# Patient Record
Sex: Male | Born: 1945
Health system: Southern US, Community
[De-identification: ages and names within clinical notes are randomized; demographics above are authoritative.]

## PROBLEM LIST (undated history)

## (undated) DIAGNOSIS — Z7189 Other specified counseling: Secondary | ICD-10-CM

## (undated) DIAGNOSIS — Z972 Presence of dental prosthetic device (complete) (partial): Secondary | ICD-10-CM

## (undated) DIAGNOSIS — G629 Polyneuropathy, unspecified: Secondary | ICD-10-CM

## (undated) DIAGNOSIS — I2510 Atherosclerotic heart disease of native coronary artery without angina pectoris: Secondary | ICD-10-CM

## (undated) DIAGNOSIS — R413 Other amnesia: Secondary | ICD-10-CM

## (undated) DIAGNOSIS — N529 Male erectile dysfunction, unspecified: Secondary | ICD-10-CM

## (undated) DIAGNOSIS — Z973 Presence of spectacles and contact lenses: Secondary | ICD-10-CM

## (undated) DIAGNOSIS — E785 Hyperlipidemia, unspecified: Secondary | ICD-10-CM

## (undated) DIAGNOSIS — C8338 Diffuse large B-cell lymphoma, lymph nodes of multiple sites: Secondary | ICD-10-CM

## (undated) DIAGNOSIS — K219 Gastro-esophageal reflux disease without esophagitis: Secondary | ICD-10-CM

## (undated) DIAGNOSIS — J329 Chronic sinusitis, unspecified: Secondary | ICD-10-CM

## (undated) DIAGNOSIS — C9110 Chronic lymphocytic leukemia of B-cell type not having achieved remission: Secondary | ICD-10-CM

## (undated) DIAGNOSIS — I10 Essential (primary) hypertension: Secondary | ICD-10-CM

## (undated) DIAGNOSIS — E119 Type 2 diabetes mellitus without complications: Secondary | ICD-10-CM

## (undated) DIAGNOSIS — G4730 Sleep apnea, unspecified: Secondary | ICD-10-CM

## (undated) DIAGNOSIS — M109 Gout, unspecified: Secondary | ICD-10-CM

## (undated) DIAGNOSIS — K5790 Diverticulosis of intestine, part unspecified, without perforation or abscess without bleeding: Secondary | ICD-10-CM

## (undated) DIAGNOSIS — G959 Disease of spinal cord, unspecified: Secondary | ICD-10-CM

## (undated) DIAGNOSIS — E041 Nontoxic single thyroid nodule: Secondary | ICD-10-CM

## (undated) DIAGNOSIS — T148XXA Other injury of unspecified body region, initial encounter: Secondary | ICD-10-CM

## (undated) DIAGNOSIS — N281 Cyst of kidney, acquired: Secondary | ICD-10-CM

## (undated) DIAGNOSIS — M5416 Radiculopathy, lumbar region: Secondary | ICD-10-CM

## (undated) DIAGNOSIS — G4733 Obstructive sleep apnea (adult) (pediatric): Secondary | ICD-10-CM

## (undated) DIAGNOSIS — C801 Malignant (primary) neoplasm, unspecified: Secondary | ICD-10-CM

## (undated) HISTORY — PX: BACK SURGERY: SHX140

## (undated) HISTORY — DX: Radiculopathy, lumbar region: M54.16

## (undated) HISTORY — DX: Hyperlipidemia, unspecified: E78.5

## (undated) HISTORY — DX: Polyneuropathy, unspecified: G62.9

## (undated) HISTORY — PX: COLONOSCOPY: SHX5424

## (undated) HISTORY — DX: Diverticulosis of intestine, part unspecified, without perforation or abscess without bleeding: K57.90

## (undated) HISTORY — DX: Nontoxic single thyroid nodule: E04.1

## (undated) HISTORY — DX: Gout, unspecified: M10.9

## (undated) HISTORY — DX: Cyst of kidney, acquired: N28.1

## (undated) HISTORY — DX: Other amnesia: R41.3

## (undated) HISTORY — PX: CARDIAC CATHETERIZATION: SHX172

## (undated) HISTORY — DX: Male erectile dysfunction, unspecified: N52.9

## (undated) HISTORY — DX: Obstructive sleep apnea (adult) (pediatric): G47.33

## (undated) HISTORY — DX: Chronic sinusitis, unspecified: J32.9

## (undated) HISTORY — PX: MULTIPLE TOOTH EXTRACTIONS: SHX2053

## (undated) HISTORY — DX: Disease of spinal cord, unspecified: G95.9

---
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
## 1996-10-03 DIAGNOSIS — I10 Essential (primary) hypertension: Secondary | ICD-10-CM | POA: Insufficient documentation

## 2000-03-01 DIAGNOSIS — J309 Allergic rhinitis, unspecified: Secondary | ICD-10-CM | POA: Insufficient documentation

## 2007-03-14 DIAGNOSIS — E785 Hyperlipidemia, unspecified: Secondary | ICD-10-CM | POA: Insufficient documentation

## 2008-12-31 DIAGNOSIS — E1165 Type 2 diabetes mellitus with hyperglycemia: Secondary | ICD-10-CM | POA: Insufficient documentation

## 2011-06-15 DIAGNOSIS — N400 Enlarged prostate without lower urinary tract symptoms: Secondary | ICD-10-CM | POA: Insufficient documentation

## 2012-03-07 DIAGNOSIS — M4326 Fusion of spine, lumbar region: Secondary | ICD-10-CM | POA: Insufficient documentation

## 2013-06-05 DIAGNOSIS — M4322 Fusion of spine, cervical region: Secondary | ICD-10-CM | POA: Insufficient documentation

## 2014-06-03 DIAGNOSIS — M960 Pseudarthrosis after fusion or arthrodesis: Secondary | ICD-10-CM | POA: Insufficient documentation

## 2015-03-02 DIAGNOSIS — R7402 Elevation of levels of lactic acid dehydrogenase (LDH): Secondary | ICD-10-CM | POA: Insufficient documentation

## 2015-03-02 DIAGNOSIS — R740 Nonspecific elevation of levels of transaminase and lactic acid dehydrogenase [LDH]: Secondary | ICD-10-CM

## 2015-07-28 DIAGNOSIS — R269 Unspecified abnormalities of gait and mobility: Secondary | ICD-10-CM | POA: Insufficient documentation

## 2016-05-22 DIAGNOSIS — R600 Localized edema: Secondary | ICD-10-CM | POA: Insufficient documentation

## 2016-05-22 DIAGNOSIS — J209 Acute bronchitis, unspecified: Secondary | ICD-10-CM | POA: Insufficient documentation

## 2016-05-22 DIAGNOSIS — A419 Sepsis, unspecified organism: Secondary | ICD-10-CM | POA: Insufficient documentation

## 2016-05-22 DIAGNOSIS — R531 Weakness: Secondary | ICD-10-CM | POA: Insufficient documentation

## 2016-05-22 DIAGNOSIS — C9510 Chronic leukemia of unspecified cell type not having achieved remission: Secondary | ICD-10-CM

## 2016-05-24 DIAGNOSIS — J101 Influenza due to other identified influenza virus with other respiratory manifestations: Secondary | ICD-10-CM | POA: Insufficient documentation

## 2016-10-31 DIAGNOSIS — I2510 Atherosclerotic heart disease of native coronary artery without angina pectoris: Secondary | ICD-10-CM | POA: Insufficient documentation

## 2017-01-25 ENCOUNTER — Other Ambulatory Visit (HOSPITAL_COMMUNITY)
Admission: RE | Admit: 2017-01-25 | Discharge: 2017-01-25 | Disposition: A | Discharge status: Home / Self Care | Payer: Federal, State, Local not specified - PPO | Source: Ambulatory Visit | Attending: Hematology & Oncology | Admitting: Hematology & Oncology

## 2017-01-25 ENCOUNTER — Ambulatory Visit (HOSPITAL_BASED_OUTPATIENT_CLINIC_OR_DEPARTMENT_OTHER): Payer: Federal, State, Local not specified - PPO | Admitting: Hematology & Oncology

## 2017-01-25 ENCOUNTER — Other Ambulatory Visit (HOSPITAL_BASED_OUTPATIENT_CLINIC_OR_DEPARTMENT_OTHER): Payer: Federal, State, Local not specified - PPO

## 2017-01-25 VITALS — BP 117/76 | HR 77 | Temp 97.9°F | Resp 20 | Wt 278.0 lb

## 2017-01-25 DIAGNOSIS — Z87891 Personal history of nicotine dependence: Secondary | ICD-10-CM | POA: Diagnosis not present

## 2017-01-25 DIAGNOSIS — C9110 Chronic lymphocytic leukemia of B-cell type not having achieved remission: Secondary | ICD-10-CM

## 2017-01-25 DIAGNOSIS — C9510 Chronic leukemia of unspecified cell type not having achieved remission: Secondary | ICD-10-CM

## 2017-01-25 LAB — CBC WITH DIFFERENTIAL (CANCER CENTER ONLY)
BASO#: 0.2 10*3/uL (ref 0.0–0.2)
BASO%: 3.8 % — ABNORMAL HIGH (ref 0.0–2.0)
EOS%: 2.7 % (ref 0.0–7.0)
Eosinophils Absolute: 0.2 10*3/uL (ref 0.0–0.5)
HCT: 43.1 % (ref 38.7–49.9)
HGB: 15.7 g/dL (ref 13.0–17.1)
LYMPH#: 1.6 10*3/uL (ref 0.9–3.3)
LYMPH%: 27.9 % (ref 14.0–48.0)
MCH: 31.8 pg (ref 28.0–33.4)
MCHC: 36.4 g/dL — ABNORMAL HIGH (ref 32.0–35.9)
MCV: 86 fL (ref 82–98)
MONO#: 0.8 10*3/uL (ref 0.1–0.9)
MONO%: 14.6 % — ABNORMAL HIGH (ref 0.0–13.0)
NEUT#: 2.8 10*3/uL (ref 1.5–6.5)
NEUT%: 51 % (ref 40.0–80.0)
Platelets: 181 10*3/uL (ref 145–400)
RBC: 5 10*6/uL (ref 4.20–5.70)
RDW: 13.2 % (ref 11.1–15.7)
WBC: 5.6 10*3/uL (ref 4.0–10.0)

## 2017-01-25 LAB — LACTATE DEHYDROGENASE: LDH: 276 U/L — ABNORMAL HIGH (ref 125–245)

## 2017-01-25 LAB — CMP (CANCER CENTER ONLY)
ALT(SGPT): 32 U/L (ref 10–47)
AST: 30 U/L (ref 11–38)
Albumin: 4.2 g/dL (ref 3.3–5.5)
Alkaline Phosphatase: 59 U/L (ref 26–84)
BUN, Bld: 12 mg/dL (ref 7–22)
CO2: 33 mEq/L (ref 18–33)
Calcium: 9.7 mg/dL (ref 8.0–10.3)
Chloride: 102 mEq/L (ref 98–108)
Creat: 1.4 mg/dl — ABNORMAL HIGH (ref 0.6–1.2)
Glucose, Bld: 128 mg/dL — ABNORMAL HIGH (ref 73–118)
Potassium: 4.1 mEq/L (ref 3.3–4.7)
Sodium: 142 mEq/L (ref 128–145)
Total Bilirubin: 1.1 mg/dl (ref 0.20–1.60)
Total Protein: 7.2 g/dL (ref 6.4–8.1)

## 2017-01-25 LAB — CHCC SATELLITE - SMEAR

## 2017-01-25 NOTE — Progress Notes (Signed)
Referral MD  Reason for Referral: History of "chronic leukemia"   Chief Complaint  Patient presents with  . New Patient (Initial Visit)  : I was told I had "chronic leukemia"  HPI: Ronald Rice is a very nice 71 year old African American male. He lived in [place] for 45 years. He is moved to the [place] area as his family is here now.  He apparently has a diagnosis of "chronic leukemia". Unfortunate, no medical records from [place] were sent to me. Everything I have is from the patient. He really is a little fuzzy on details.  He says he was diagnosed with "chronic leukemia" back in 2015. He did not have any swollen lymph nodes. He said he had no weight loss. He had no cough. He had no change in bowel or bladder habits. He had no chronic infections.  He says he never had a bone marrow test. If he is not even sure what chemotherapy he received. He said that he got chemotherapy a couple days a month.  He does not have a Port-A-Cath.  Currently, he on maintenance Rituxan. He gets every 2 months. He has had 8 treatments.  He is from [place], [place]. I try to leave a message for his oncologist in [place], Dr. Georgian Co. Unfortunate, all I got was a phone tree that Passed me on 2 other phone trees.  He apparently does not kid sick with treatments.  The problem that he is having right now that he has weakness in his feet. He has had multiple lower back surgeries. He has been operated on by Dr. Billey Co. We call his office to try to get records but they would not release anything without a signed release from the patient.  It is clear that he has some weakness in his feet. He walks with a cane. He says he's been this way for several years. I am not sure exactly what tests he's had done. He said he had an MRI done before he came to [place].  He used to smoke. He says he smoked for only 15 years. He said he smoked 1 cigarette a day.  He does have an occasional alcoholic  beverage.  He was in the [employer]. He was a Airline pilot.  There is no family history of cancer. However, he says that a sister, he thinks, had breast cancer.  He's had no other surgeries.  He's had no rashes.  He was kindly referred to the [facility] for an evaluation.  Currently, I would say his performance status is ECOG 1.   No past medical history on file.:  No past surgical history on file.:   Current Outpatient Prescriptions:  .  amLODipine (NORVASC) 5 MG tablet, Take 5 mg by mouth daily., Disp: , Rfl:  .  aspirin EC 81 MG tablet, Take 81 mg by mouth daily., Disp: , Rfl:  .  atenolol (TENORMIN) 25 MG tablet, Take by mouth daily., Disp: , Rfl:  .  Green Tea, Camillia sinensis, (GREEN TEA EXTRACT PO), Take by mouth., Disp: , Rfl:  .  lisinopril-hydrochlorothiazide (PRINZIDE,ZESTORETIC) 20-25 MG tablet, Take 2 tablets by mouth daily., Disp: , Rfl:  .  lovastatin (MEVACOR) 40 MG tablet, Take 40 mg by mouth at bedtime., Disp: , Rfl:  .  Misc Natural Products (GINSENG COMPLEX PO), Take by mouth., Disp: , Rfl:  .  NON FORMULARY, Blood Sugar Defense, Disp: , Rfl: :  :  No Known Allergies:  No family  history on file.:  Social History   Social History  . Marital status: Single    Spouse name: N/A  . Number of children: N/A  . Years of education: N/A   Occupational History  . Not on file.   Social History Main Topics  . Smoking status: Not on file  . Smokeless tobacco: Not on file  . Alcohol use Not on file  . Drug use: Unknown  . Sexual activity: Not on file   Other Topics Concern  . Not on file   Social History Narrative  . No narrative on file  :  Pertinent items are noted in HPI.  Exam:Well-developed and well-nourished African-American male in no obvious distress. His vital signs show a temperature of 97.9. Pulse 77. Blood pressure 117/76. Weight is 278 pounds. Head and neck exam shows no ocular or oral lesions. There are no palpable  cervical or supraclavicular lymph nodes. Lungs are clear bilaterally. Cardiac exam regular rate and rhythm with no murmurs, rubs or bruits. Abdomen is soft. Has good bowel sounds. He is modestly obese. He has no fluid wave. There is no palpable liver or spleen tip. There is no inguinal adenopathy bilaterally. Axillary exam shows no bilateral axillary adenopathy. Back exam shows a long lumbar laminectomy scar. No tenderness is noted over the spine, ribs or hips. Extremities shows no clubbing, cyanosis or edema. He has good range of motion of his joints. He has some weakness in his feet bilaterally with plantar and dorsi flexion. Skin exam shows no rashes, ecchymoses or petechia. Neurological exam shows no focal neurological deficits. He does have the weakness in his feet.    Recent Labs  01/25/17 1217  WBC 5.6  HGB 15.7  HCT 43.1  PLT 181    Recent Labs  01/25/17 1217  NA 142  K 4.1  CL 102  CO2 33  GLUCOSE 128*  BUN 12  CREATININE 1.4*  CALCIUM 9.7    Blood smear review:  Normochromic and normocytic population of red blood cells. There are no nucleated red blood cells. There is no teardrop cells. I see no rouleau formation. There is no inclusion bodies. White blood cells per normal in morphology and maturation. I see no immature myeloid or lymphoid forms. Platelets are adequate in number and size.  Pathology: Flow cytometry is pending     Assessment and Plan:  Ronald Rice is a 71 year old African-American male. He was out in [place]. He was treated in [place].  I really am at a "disadvantage" in trying to figure out what needs to be done outside of the Rituxan or even if he needs Rituxan. I really have to have records from [place]. I have made calls out to [place] with no luck so far.  I really needs to see what type of "chronic leukemia" he has. It sounds like CLL. It may be a low-grade lymphoma.  I'm sure that he got outstanding care in [place]. Again we really  have to see the records.  My concern now is the weakness in his feet. I do believe that this is coming from his lumbar spine. I don't think this would be anything related to his leukemia, unless he had vincristine or maybe Velcade and has some kind of neuropathy.  I'm going to have to refer him to spinal surgery. I will like to have Dr. Phylliss Bob see him.  I think we certainly have time to figure out what is going on before we give him Rituxan.  I spent about 60 minutes with he and his son-in-law. Also, I think his sister may have come along also.  I answered all their questions. I explained to them my concern. He is in agreement. He says he's had this problem with his feet for several years. I can't figure out why, if his had this for so long, this was not tended to in [place].  Hopefully, we'll get records from [place], we will be able to sort out the issues.  I will not make a follow-up appointment for him yet until I get records so we know exactly what is going on.  He is a true American hero. He far for her country. His son-in-law was in [employer] for 23 years. I also thanked him for his service and for protecting our country and our rights.

## 2017-01-26 LAB — BETA 2 MICROGLOBULIN, SERUM: Beta-2: 2.3 mg/L (ref 0.6–2.4)

## 2017-02-14 ENCOUNTER — Other Ambulatory Visit: Payer: Self-pay | Admitting: Physical Medicine and Rehabilitation

## 2017-02-14 DIAGNOSIS — M25551 Pain in right hip: Secondary | ICD-10-CM

## 2017-02-18 ENCOUNTER — Encounter: Payer: Self-pay | Admitting: Hematology & Oncology

## 2017-02-18 LAB — FLOW CYTOMETRY - CHCC SATELLITE

## 2017-02-24 ENCOUNTER — Ambulatory Visit
Admission: RE | Admit: 2017-02-24 | Discharge: 2017-02-24 | Disposition: A | Discharge status: Home / Self Care | Payer: Federal, State, Local not specified - PPO | Source: Ambulatory Visit | Attending: Physical Medicine and Rehabilitation | Admitting: Physical Medicine and Rehabilitation

## 2017-02-24 DIAGNOSIS — M25551 Pain in right hip: Secondary | ICD-10-CM

## 2017-02-24 MED ORDER — IOPAMIDOL (ISOVUE-M 200) INJECTION 41%
1.0000 mL | Freq: Once | INTRAMUSCULAR | Status: AC
  Administered 2017-02-24: 1 mL via INTRA_ARTICULAR

## 2017-02-24 MED ORDER — METHYLPREDNISOLONE ACETATE 40 MG/ML INJ SUSP (RADIOLOG
120.0000 mg | Freq: Once | INTRAMUSCULAR | Status: AC
  Administered 2017-02-24: 120 mg via INTRA_ARTICULAR

## 2017-02-25 ENCOUNTER — Other Ambulatory Visit: Payer: Self-pay | Admitting: Orthopedic Surgery

## 2017-02-25 DIAGNOSIS — M5412 Radiculopathy, cervical region: Secondary | ICD-10-CM

## 2017-03-01 DIAGNOSIS — Z125 Encounter for screening for malignant neoplasm of prostate: Secondary | ICD-10-CM | POA: Diagnosis not present

## 2017-03-01 DIAGNOSIS — I10 Essential (primary) hypertension: Secondary | ICD-10-CM | POA: Diagnosis not present

## 2017-03-01 DIAGNOSIS — E785 Hyperlipidemia, unspecified: Secondary | ICD-10-CM | POA: Diagnosis not present

## 2017-03-01 DIAGNOSIS — E119 Type 2 diabetes mellitus without complications: Secondary | ICD-10-CM | POA: Diagnosis not present

## 2017-03-08 DIAGNOSIS — E119 Type 2 diabetes mellitus without complications: Secondary | ICD-10-CM | POA: Diagnosis not present

## 2017-03-08 DIAGNOSIS — I10 Essential (primary) hypertension: Secondary | ICD-10-CM | POA: Diagnosis not present

## 2017-03-08 DIAGNOSIS — C9190 Lymphoid leukemia, unspecified not having achieved remission: Secondary | ICD-10-CM | POA: Diagnosis not present

## 2017-03-08 DIAGNOSIS — E785 Hyperlipidemia, unspecified: Secondary | ICD-10-CM | POA: Diagnosis not present

## 2017-03-08 DIAGNOSIS — M109 Gout, unspecified: Secondary | ICD-10-CM | POA: Diagnosis not present

## 2017-03-08 DIAGNOSIS — I2510 Atherosclerotic heart disease of native coronary artery without angina pectoris: Secondary | ICD-10-CM | POA: Diagnosis not present

## 2017-03-08 DIAGNOSIS — K219 Gastro-esophageal reflux disease without esophagitis: Secondary | ICD-10-CM | POA: Diagnosis not present

## 2017-03-08 DIAGNOSIS — M4322 Fusion of spine, cervical region: Secondary | ICD-10-CM | POA: Diagnosis not present

## 2017-03-08 DIAGNOSIS — R29898 Other symptoms and signs involving the musculoskeletal system: Secondary | ICD-10-CM | POA: Diagnosis not present

## 2017-03-08 DIAGNOSIS — R292 Abnormal reflex: Secondary | ICD-10-CM | POA: Diagnosis not present

## 2017-03-08 DIAGNOSIS — Z6836 Body mass index (BMI) 36.0-36.9, adult: Secondary | ICD-10-CM | POA: Diagnosis not present

## 2017-03-08 DIAGNOSIS — K5790 Diverticulosis of intestine, part unspecified, without perforation or abscess without bleeding: Secondary | ICD-10-CM | POA: Diagnosis not present

## 2017-03-08 DIAGNOSIS — B360 Pityriasis versicolor: Secondary | ICD-10-CM | POA: Diagnosis not present

## 2017-03-14 ENCOUNTER — Ambulatory Visit
Admission: RE | Admit: 2017-03-14 | Discharge: 2017-03-14 | Disposition: A | Discharge status: Home / Self Care | Payer: Federal, State, Local not specified - PPO | Source: Ambulatory Visit | Attending: Orthopedic Surgery | Admitting: Orthopedic Surgery

## 2017-03-14 DIAGNOSIS — M5412 Radiculopathy, cervical region: Secondary | ICD-10-CM

## 2017-03-14 MED ORDER — DIAZEPAM 5 MG PO TABS
5.0000 mg | ORAL_TABLET | Freq: Once | ORAL | Status: AC
  Administered 2017-03-14: 5 mg via ORAL

## 2017-03-14 MED ORDER — IOPAMIDOL (ISOVUE-M 300) INJECTION 61%
10.0000 mL | Freq: Once | INTRAMUSCULAR | Status: AC | PRN
  Administered 2017-03-14: 10 mL via INTRATHECAL

## 2017-03-14 NOTE — Discharge Instructions (Signed)

## 2017-03-28 ENCOUNTER — Other Ambulatory Visit: Payer: Self-pay | Admitting: Orthopedic Surgery

## 2017-04-18 ENCOUNTER — Encounter (HOSPITAL_COMMUNITY): Payer: Self-pay

## 2017-04-18 ENCOUNTER — Encounter (HOSPITAL_COMMUNITY)
Admission: RE | Admit: 2017-04-18 | Discharge: 2017-04-18 | Disposition: A | Discharge status: Home / Self Care | Payer: Federal, State, Local not specified - PPO | Source: Ambulatory Visit | Attending: Orthopedic Surgery | Admitting: Orthopedic Surgery

## 2017-04-18 ENCOUNTER — Ambulatory Visit (HOSPITAL_COMMUNITY)
Admission: RE | Admit: 2017-04-18 | Discharge: 2017-04-18 | Disposition: A | Discharge status: Home / Self Care | Payer: Federal, State, Local not specified - PPO | Source: Ambulatory Visit | Attending: Orthopedic Surgery | Admitting: Orthopedic Surgery

## 2017-04-18 ENCOUNTER — Other Ambulatory Visit: Payer: Self-pay

## 2017-04-18 DIAGNOSIS — Z79899 Other long term (current) drug therapy: Secondary | ICD-10-CM | POA: Insufficient documentation

## 2017-04-18 DIAGNOSIS — G9589 Other specified diseases of spinal cord: Secondary | ICD-10-CM | POA: Diagnosis not present

## 2017-04-18 DIAGNOSIS — Z01818 Encounter for other preprocedural examination: Secondary | ICD-10-CM

## 2017-04-18 DIAGNOSIS — Z7982 Long term (current) use of aspirin: Secondary | ICD-10-CM | POA: Diagnosis not present

## 2017-04-18 HISTORY — DX: Malignant (primary) neoplasm, unspecified: C80.1

## 2017-04-18 HISTORY — DX: Type 2 diabetes mellitus without complications: E11.9

## 2017-04-18 HISTORY — DX: Atherosclerotic heart disease of native coronary artery without angina pectoris: I25.10

## 2017-04-18 HISTORY — DX: Gastro-esophageal reflux disease without esophagitis: K21.9

## 2017-04-18 HISTORY — DX: Essential (primary) hypertension: I10

## 2017-04-18 HISTORY — DX: Chronic lymphocytic leukemia of B-cell type not having achieved remission: C91.10

## 2017-04-18 LAB — COMPREHENSIVE METABOLIC PANEL
ALT: 31 U/L (ref 17–63)
AST: 28 U/L (ref 15–41)
Albumin: 4.4 g/dL (ref 3.5–5.0)
Alkaline Phosphatase: 58 U/L (ref 38–126)
Anion gap: 7 (ref 5–15)
BUN: 13 mg/dL (ref 6–20)
CO2: 30 mmol/L (ref 22–32)
Calcium: 9.9 mg/dL (ref 8.9–10.3)
Chloride: 104 mmol/L (ref 101–111)
Creatinine, Ser: 1.23 mg/dL (ref 0.61–1.24)
GFR calc Af Amer: >60 mL/min (ref >60)
GFR calc non Af Amer: 58 mL/min — ABNORMAL LOW (ref >60)
Glucose, Bld: 118 mg/dL — ABNORMAL HIGH (ref 65–99)
Potassium: 3.6 mmol/L (ref 3.5–5.1)
Sodium: 141 mmol/L (ref 135–145)
Total Bilirubin: 1.1 mg/dL (ref 0.3–1.2)
Total Protein: 7 g/dL (ref 6.5–8.1)

## 2017-04-18 LAB — CBC WITH DIFFERENTIAL/PLATELET
Basophils Absolute: 0 10*3/uL (ref 0.0–0.1)
Basophils Relative: 0 %
Eosinophils Absolute: 0.1 10*3/uL (ref 0.0–0.7)
Eosinophils Relative: 2 %
HCT: 44 % (ref 39.0–52.0)
Hemoglobin: 16.2 g/dL (ref 13.0–17.0)
Lymphocytes Relative: 31 %
Lymphs Abs: 1.5 10*3/uL (ref 0.7–4.0)
MCH: 31.8 pg (ref 26.0–34.0)
MCHC: 36.8 g/dL — ABNORMAL HIGH (ref 30.0–36.0)
MCV: 86.3 fL (ref 78.0–100.0)
Monocytes Absolute: 0.5 10*3/uL (ref 0.1–1.0)
Monocytes Relative: 11 %
Neutro Abs: 2.6 10*3/uL (ref 1.7–7.7)
Neutrophils Relative %: 56 %
Platelets: 148 10*3/uL — ABNORMAL LOW (ref 150–400)
RBC: 5.1 MIL/uL (ref 4.22–5.81)
RDW: 13.3 % (ref 11.5–15.5)
WBC: 4.7 10*3/uL (ref 4.0–10.5)

## 2017-04-18 LAB — GLUCOSE, CAPILLARY: Glucose-Capillary: 119 mg/dL — ABNORMAL HIGH (ref 65–99)

## 2017-04-18 LAB — SURGICAL PCR SCREEN
MRSA, PCR: NEGATIVE
Staphylococcus aureus: NEGATIVE

## 2017-04-18 LAB — PROTIME-INR
INR: 0.98
Prothrombin Time: 12.9 seconds (ref 11.4–15.2)

## 2017-04-18 LAB — APTT: aPTT: 29 seconds (ref 24–36)

## 2017-04-18 NOTE — Progress Notes (Addendum)
PCP is Dr. Deland Pretty Denies chest pain, fever, cough. Reports that he doesn't check his CBG's A1c done 03-01-17 was 6.3 Denies ever seeing a cardiologist or having any testing done. Asked him about  Card cath and echo noted in care everywhere.  States "I didn't have a heart attack" Rechecked BP 130/86 EKG tracing placed on chart done 03-08-17 also lab results on chart.

## 2017-04-18 NOTE — Pre-Procedure Instructions (Signed)
Ronald Rice  04/18/2017      [facility] - [place], [place] - [address] [place] [address] Phone: [phone] Fax: [phone]    Your procedure is scheduled on 04/20/2017- Wednesday  Report to [facility] Admitting at 5:30 A.M.  Call this number if you have problems the morning of surgery:  [phone]   Remember:  Do not eat food or drink liquids after midnight.  On  Tuesday                    STOP SUPPLEMENTS NOW: Ginseng, Blood sugar defense, Cinnamon  Vitamins, Aspirin, IBUPROFEN, Advil, Motrin, Goody's, BC's, Herbal medications, Fish Oil    Take these medicines the morning of surgery with A SIP OF WATER allopurinol (Zyloprim), Amlodipine (norvasc), Atenolol (tenormin)    Do not wear jewelry   Do not wear lotions, powders, or perfumes, or deoderant.              Men may shave face and neck.   Do not bring valuables to the hospital.    [facility] is not responsible for any belongings or valuables.  Contacts, dentures or bridgework may not be worn into surgery.  Leave your suitcase in the car.  After surgery it may be brought to your room.  For patients admitted to the hospital, discharge time will be determined by your treatment team.  Patients discharged the day of surgery will not be allowed to drive home.   Name and phone number of your driver:    Special instructions:  Special Instructions: [facility] - Preparing for Surgery  Before surgery, you can play an important role.  Because skin is not sterile, your skin needs to be as free of germs as possible.  You can reduce the number of germs on you skin by washing with CHG (chlorahexidine gluconate) soap before surgery.  CHG is an antiseptic cleaner which kills germs and bonds with the skin to continue killing germs even after washing.  Please DO NOT use if you have an allergy to CHG or antibacterial soaps.  If your skin  becomes reddened/irritated stop using the CHG and inform your nurse when you arrive at Short Stay.  Do not shave (including legs and underarms) for at least 48 hours prior to the first CHG shower.  You may shave your face.  Please follow these instructions carefully:   1.  Shower with CHG Soap the night before surgery and the  morning of Surgery.  2.  If you choose to wash your hair, wash your hair first as usual with your  normal shampoo.  3.  After you shampoo, rinse your hair and body thoroughly to remove the  Shampoo.  4.  Use CHG as you would any other liquid soap.  You can apply chg directly to the skin and wash gently with scrungie or a clean washcloth.  5.  Apply the CHG Soap to your body ONLY FROM THE NECK DOWN.    Do not use on open wounds or open sores.  Avoid contact with your eyes, ears, mouth and genitals (private parts).  Wash genitals (private parts)   with your normal soap.  6.  Wash thoroughly, paying special attention to the area where your surgery will be performed.  7.  Thoroughly rinse your body with warm water from the neck down.  8.  DO  NOT shower/wash with your normal soap after using and rinsing off   the CHG Soap.  9.  Pat yourself dry with a clean towel.            10.  Wear clean pajamas.            11.  Place clean sheets on your bed the night of your first shower and do not sleep with pets.  Day of Surgery  Do not apply any lotions/deodorants the morning of surgery.  Please wear clean clothes to the hospital/surgery center.  Please read over the following fact sheets that you were given. Coughing and Deep Breathing, MRSA Information and Surgical Site Infection Prevention

## 2017-04-18 NOTE — Progress Notes (Signed)
   04/18/17 1327  OBSTRUCTIVE SLEEP APNEA  Have you ever been diagnosed with sleep apnea through a sleep study? No  Do you snore loudly (loud enough to be heard through closed doors)?  1  Do you often feel tired, fatigued, or sleepy during the daytime (such as falling asleep during driving or talking to someone)? 0  Has anyone observed you stop breathing during your sleep? 0  Do you have, or are you being treated for high blood pressure? 1  BMI more than 35 kg/m2? 1  Age > 50 (1-yes) 1  Neck circumference greater than:Male 16 inches or larger, Male 17inches or larger? 1  Male Gender (Yes=1) 1  Obstructive Sleep Apnea Score 6  Score 5 or greater  Results sent to PCP

## 2017-04-19 ENCOUNTER — Encounter (HOSPITAL_COMMUNITY): Payer: Self-pay

## 2017-04-19 MED ORDER — DEXTROSE 5 % IV SOLN
3.0000 g | INTRAVENOUS | Status: AC
  Administered 2017-04-20: 3 g via INTRAVENOUS
  Filled 2017-04-19: qty 3000

## 2017-04-19 NOTE — Progress Notes (Signed)
Anesthesia Chart Review:  Pt is a 71 year old male scheduled for C3-4, C4-5 ACDF on 04/20/2017 with Phylliss Bob, MD  Pt moved to [facility] from [place] last summer.   - PCP is Deland Pretty, MD - Oncologist is Burney Gauze, MD. Previously seen in [place] (notes in care everywhere)   PMH includes:  CAD (minimal nonobstructive by 11/01/16 cath), HTN, DM, CLL, GERD. Never smoker. BMI 35.   - Hospitalized 6/9-11/18 (at [facility] in [place]) for chest pain with activity (cleaning house). Troponin negative, cardiac cath with mild nonobstructive disease   Medications include: Amlodipine, ASA 81 mg, atenolol, Lipitor, lisinopril-HCTZ  BP 130/86   Pulse 73   Temp 36.8 C   Resp 20   Ht 6\' 2"  (1.88 m)   Wt 273 lb 1.6 oz (123.9 kg)   SpO2 97%   BMI 35.06 kg/m   Preoperative labs reviewed and are acceptable for surgery.   CXR 04/18/17: Low lung volumes. No acute cardiopulmonary disease. Chest is stable from prior exam.  EKG 03/08/17 (Dr. Pennie Banter office): Sinus rhythm. LA enlargement.   Cardiac cath 11/01/16 (care everywhere; done for abnormal stress test showing reversible ischemia):  1. Mild, non-obstructive CAD. - LMCA: mild disease - LAD: Mild luminal irregularities.myocardial bridge of the mid LAD - LCx: Mild luminal irregularities. -  RCA: Mild non-obstructive disease. 2. Moderately elevated LVEDP (25 mmHg). 3.  Recommendation: Medical therapy for CAD.  Echo 10/31/16 (care everywhere):  - Left ventricular systolic function is mildly decreased with an ejection fraction of 50-55% measured by visual estimate.The mid inferoseptum and mid inferior walls are mildly hypokinetic. - No significant valvular abnormality. - Normal PA pressure estimate.  If no changes, I anticipate pt can proceed with surgery as scheduled.   Willeen Cass, FNP-BC [facility] Short Stay Surgical [facility]/Anesthesiology Phone: [phone] 04/19/2017 11:11 AM

## 2017-04-20 ENCOUNTER — Ambulatory Visit (HOSPITAL_COMMUNITY): Payer: Federal, State, Local not specified - PPO

## 2017-04-20 ENCOUNTER — Ambulatory Visit (HOSPITAL_COMMUNITY): Payer: Federal, State, Local not specified - PPO | Admitting: Emergency Medicine

## 2017-04-20 ENCOUNTER — Ambulatory Visit (HOSPITAL_COMMUNITY): Payer: Federal, State, Local not specified - PPO | Admitting: Anesthesiology

## 2017-04-20 ENCOUNTER — Observation Stay (HOSPITAL_COMMUNITY)
Admission: RE | Admit: 2017-04-20 | Discharge: 2017-04-21 | Disposition: A | Discharge status: Home / Self Care | Payer: Federal, State, Local not specified - PPO | Source: Ambulatory Visit | Attending: Orthopedic Surgery | Admitting: Orthopedic Surgery

## 2017-04-20 ENCOUNTER — Encounter (HOSPITAL_COMMUNITY): Payer: Self-pay | Admitting: Anesthesiology

## 2017-04-20 ENCOUNTER — Encounter (HOSPITAL_COMMUNITY)
Admission: RE | Disposition: A | Discharge status: Home / Self Care | Payer: Self-pay | Source: Ambulatory Visit | Attending: Orthopedic Surgery

## 2017-04-20 DIAGNOSIS — Z981 Arthrodesis status: Secondary | ICD-10-CM | POA: Diagnosis not present

## 2017-04-20 DIAGNOSIS — Z79899 Other long term (current) drug therapy: Secondary | ICD-10-CM | POA: Insufficient documentation

## 2017-04-20 DIAGNOSIS — M5410 Radiculopathy, site unspecified: Secondary | ICD-10-CM | POA: Diagnosis present

## 2017-04-20 DIAGNOSIS — Z419 Encounter for procedure for purposes other than remedying health state, unspecified: Secondary | ICD-10-CM

## 2017-04-20 DIAGNOSIS — N281 Cyst of kidney, acquired: Secondary | ICD-10-CM | POA: Insufficient documentation

## 2017-04-20 DIAGNOSIS — I2510 Atherosclerotic heart disease of native coronary artery without angina pectoris: Secondary | ICD-10-CM | POA: Insufficient documentation

## 2017-04-20 DIAGNOSIS — Z856 Personal history of leukemia: Secondary | ICD-10-CM | POA: Diagnosis not present

## 2017-04-20 DIAGNOSIS — I10 Essential (primary) hypertension: Secondary | ICD-10-CM | POA: Diagnosis not present

## 2017-04-20 DIAGNOSIS — G9520 Unspecified cord compression: Secondary | ICD-10-CM | POA: Insufficient documentation

## 2017-04-20 DIAGNOSIS — Z7982 Long term (current) use of aspirin: Secondary | ICD-10-CM | POA: Diagnosis not present

## 2017-04-20 DIAGNOSIS — G959 Disease of spinal cord, unspecified: Principal | ICD-10-CM | POA: Insufficient documentation

## 2017-04-20 DIAGNOSIS — E119 Type 2 diabetes mellitus without complications: Secondary | ICD-10-CM | POA: Insufficient documentation

## 2017-04-20 DIAGNOSIS — M5412 Radiculopathy, cervical region: Secondary | ICD-10-CM | POA: Diagnosis not present

## 2017-04-20 HISTORY — PX: ANTERIOR CERVICAL DECOMP/DISCECTOMY FUSION: SHX1161

## 2017-04-20 LAB — URINALYSIS, ROUTINE W REFLEX MICROSCOPIC
Bilirubin Urine: NEGATIVE
Glucose, UA: NEGATIVE mg/dL
Hgb urine dipstick: NEGATIVE
Ketones, ur: NEGATIVE mg/dL
Leukocytes, UA: NEGATIVE
Nitrite: NEGATIVE
Protein, ur: NEGATIVE mg/dL
Specific Gravity, Urine: 1.017 (ref 1.005–1.030)
pH: 7 (ref 5.0–8.0)

## 2017-04-20 LAB — GLUCOSE, CAPILLARY
Glucose-Capillary: 186 mg/dL — ABNORMAL HIGH (ref 65–99)
Glucose-Capillary: 216 mg/dL — ABNORMAL HIGH (ref 65–99)
Glucose-Capillary: 213 mg/dL — ABNORMAL HIGH (ref 65–99)

## 2017-04-20 SURGERY — ANTERIOR CERVICAL DECOMPRESSION/DISCECTOMY FUSION 2 LEVELS
Anesthesia: General

## 2017-04-20 MED ORDER — LACTATED RINGERS IV SOLN
INTRAVENOUS | Status: DC | PRN
  Administered 2017-04-20 (×2): via INTRAVENOUS

## 2017-04-20 MED ORDER — PHENYLEPHRINE HCL 10 MG/ML IJ SOLN
INTRAMUSCULAR | Status: DC | PRN
  Administered 2017-04-20 (×7): 80 ug via INTRAVENOUS

## 2017-04-20 MED ORDER — ATORVASTATIN CALCIUM 20 MG PO TABS
40.0000 mg | ORAL_TABLET | Freq: Every day | ORAL | Status: DC
  Administered 2017-04-20: 40 mg via ORAL
  Filled 2017-04-20: qty 2

## 2017-04-20 MED ORDER — ZOLPIDEM TARTRATE 5 MG PO TABS: 5.0000 mg | ORAL_TABLET | Freq: Every evening | ORAL | Status: DC | PRN

## 2017-04-20 MED ORDER — LIDOCAINE 2% (20 MG/ML) 5 ML SYRINGE
INTRAMUSCULAR | Status: AC
  Filled 2017-04-20: qty 5

## 2017-04-20 MED ORDER — MINERAL OIL LIGHT 100 % EX OIL
TOPICAL_OIL | CUTANEOUS | Status: AC
  Filled 2017-04-20: qty 25

## 2017-04-20 MED ORDER — BUPIVACAINE-EPINEPHRINE 0.25% -1:200000 IJ SOLN
INTRAMUSCULAR | Status: DC | PRN
  Administered 2017-04-20: 10 mL

## 2017-04-20 MED ORDER — CEFAZOLIN SODIUM-DEXTROSE 2-4 GM/100ML-% IV SOLN
2.0000 g | Freq: Three times a day (TID) | INTRAVENOUS | Status: AC
  Administered 2017-04-20 (×2): 2 g via INTRAVENOUS
  Filled 2017-04-20 (×2): qty 100

## 2017-04-20 MED ORDER — OXYCODONE-ACETAMINOPHEN 5-325 MG PO TABS
1.0000 | ORAL_TABLET | ORAL | Status: DC | PRN
  Administered 2017-04-20 – 2017-04-21 (×5): 2 via ORAL
  Filled 2017-04-20 (×5): qty 2

## 2017-04-20 MED ORDER — ONDANSETRON HCL 4 MG/2ML IJ SOLN
INTRAMUSCULAR | Status: DC | PRN
  Administered 2017-04-20: 4 mg via INTRAVENOUS

## 2017-04-20 MED ORDER — FENTANYL CITRATE (PF) 100 MCG/2ML IJ SOLN
INTRAMUSCULAR | Status: AC
  Filled 2017-04-20: qty 2

## 2017-04-20 MED ORDER — FENTANYL CITRATE (PF) 100 MCG/2ML IJ SOLN
INTRAMUSCULAR | Status: DC | PRN
  Administered 2017-04-20: 25 ug via INTRAVENOUS
  Administered 2017-04-20: 100 ug via INTRAVENOUS
  Administered 2017-04-20: 25 ug via INTRAVENOUS
  Administered 2017-04-20 (×4): 50 ug via INTRAVENOUS

## 2017-04-20 MED ORDER — MENTHOL 3 MG MT LOZG: 1.0000 | LOZENGE | OROMUCOSAL | Status: DC | PRN

## 2017-04-20 MED ORDER — ONDANSETRON HCL 4 MG/2ML IJ SOLN: 4.0000 mg | Freq: Four times a day (QID) | INTRAMUSCULAR | Status: DC | PRN

## 2017-04-20 MED ORDER — SENNOSIDES-DOCUSATE SODIUM 8.6-50 MG PO TABS
1.0000 | ORAL_TABLET | Freq: Every evening | ORAL | Status: DC | PRN
Start: 2017-04-20 — End: 2017-04-21

## 2017-04-20 MED ORDER — MIDAZOLAM HCL 5 MG/5ML IJ SOLN
INTRAMUSCULAR | Status: DC | PRN
  Administered 2017-04-20: 2 mg via INTRAVENOUS

## 2017-04-20 MED ORDER — PHENOL 1.4 % MT LIQD
1.0000 | OROMUCOSAL | Status: DC | PRN
  Administered 2017-04-20: 1 via OROMUCOSAL
  Filled 2017-04-20: qty 177

## 2017-04-20 MED ORDER — AMLODIPINE BESYLATE 5 MG PO TABS
5.0000 mg | ORAL_TABLET | Freq: Every day | ORAL | Status: DC
  Administered 2017-04-21: 5 mg via ORAL
  Filled 2017-04-20: qty 1

## 2017-04-20 MED ORDER — SUGAMMADEX SODIUM 200 MG/2ML IV SOLN
INTRAVENOUS | Status: DC | PRN
  Administered 2017-04-20: 200 mg via INTRAVENOUS

## 2017-04-20 MED ORDER — EPHEDRINE SULFATE 50 MG/ML IJ SOLN
INTRAMUSCULAR | Status: DC | PRN
  Administered 2017-04-20: 10 mg via INTRAVENOUS

## 2017-04-20 MED ORDER — FENTANYL CITRATE (PF) 250 MCG/5ML IJ SOLN
INTRAMUSCULAR | Status: AC
  Filled 2017-04-20: qty 5

## 2017-04-20 MED ORDER — BUPIVACAINE-EPINEPHRINE (PF) 0.25% -1:200000 IJ SOLN
INTRAMUSCULAR | Status: AC
  Filled 2017-04-20: qty 30

## 2017-04-20 MED ORDER — SODIUM CHLORIDE 0.9% FLUSH: 3.0000 mL | INTRAVENOUS | Status: DC | PRN

## 2017-04-20 MED ORDER — ROCURONIUM BROMIDE 10 MG/ML (PF) SYRINGE
PREFILLED_SYRINGE | INTRAVENOUS | Status: AC
  Filled 2017-04-20: qty 5

## 2017-04-20 MED ORDER — ACETAMINOPHEN 650 MG RE SUPP: 650.0000 mg | RECTAL | Status: DC | PRN

## 2017-04-20 MED ORDER — OXYCODONE HCL 5 MG PO TABS
5.0000 mg | ORAL_TABLET | Freq: Once | ORAL | Status: AC | PRN
  Administered 2017-04-20: 5 mg via ORAL

## 2017-04-20 MED ORDER — DOCUSATE SODIUM 100 MG PO CAPS
100.0000 mg | ORAL_CAPSULE | Freq: Two times a day (BID) | ORAL | Status: DC
  Administered 2017-04-20 – 2017-04-21 (×2): 100 mg via ORAL
  Filled 2017-04-20 (×2): qty 1

## 2017-04-20 MED ORDER — PANTOPRAZOLE SODIUM 40 MG IV SOLR
40.0000 mg | Freq: Every day | INTRAVENOUS | Status: DC
  Administered 2017-04-20: 40 mg via INTRAVENOUS
  Filled 2017-04-20: qty 40

## 2017-04-20 MED ORDER — ACETAMINOPHEN 325 MG PO TABS: 650.0000 mg | ORAL_TABLET | ORAL | Status: DC | PRN

## 2017-04-20 MED ORDER — OXYCODONE HCL 5 MG PO TABS
ORAL_TABLET | ORAL | Status: AC
  Filled 2017-04-20: qty 1

## 2017-04-20 MED ORDER — SODIUM CHLORIDE 0.9% FLUSH
3.0000 mL | Freq: Two times a day (BID) | INTRAVENOUS | Status: DC
  Administered 2017-04-20 (×2): 3 mL via INTRAVENOUS

## 2017-04-20 MED ORDER — ATENOLOL 25 MG PO TABS
25.0000 mg | ORAL_TABLET | Freq: Every day | ORAL | Status: DC
  Administered 2017-04-21: 25 mg via ORAL
  Filled 2017-04-20: qty 1

## 2017-04-20 MED ORDER — THROMBIN (RECOMBINANT) 5000 UNITS EX SOLR
CUTANEOUS | Status: DC | PRN
  Administered 2017-04-20: 10000 [IU] via TOPICAL

## 2017-04-20 MED ORDER — LIDOCAINE HCL (CARDIAC) 20 MG/ML IV SOLN
INTRAVENOUS | Status: DC | PRN
  Administered 2017-04-20: 80 mg via INTRAVENOUS

## 2017-04-20 MED ORDER — ROCURONIUM BROMIDE 100 MG/10ML IV SOLN
INTRAVENOUS | Status: DC | PRN
  Administered 2017-04-20: 60 mg via INTRAVENOUS
  Administered 2017-04-20 (×2): 20 mg via INTRAVENOUS
  Administered 2017-04-20: 10 mg via INTRAVENOUS

## 2017-04-20 MED ORDER — FENTANYL CITRATE (PF) 100 MCG/2ML IJ SOLN
25.0000 ug | INTRAMUSCULAR | Status: DC | PRN
  Administered 2017-04-20 (×2): 50 ug via INTRAVENOUS

## 2017-04-20 MED ORDER — FLEET ENEMA 7-19 GM/118ML RE ENEM: 1.0000 | ENEMA | Freq: Once | RECTAL | Status: DC | PRN

## 2017-04-20 MED ORDER — PROPOFOL 10 MG/ML IV BOLUS
INTRAVENOUS | Status: DC | PRN
  Administered 2017-04-20: 120 mg via INTRAVENOUS

## 2017-04-20 MED ORDER — THROMBIN (RECOMBINANT) 5000 UNITS EX SOLR
CUTANEOUS | Status: AC
  Filled 2017-04-20: qty 10000

## 2017-04-20 MED ORDER — LISINOPRIL-HYDROCHLOROTHIAZIDE 20-25 MG PO TABS: 2.0000 | ORAL_TABLET | Freq: Every day | ORAL | Status: DC

## 2017-04-20 MED ORDER — POVIDONE-IODINE 7.5 % EX SOLN: Freq: Once | CUTANEOUS | Status: DC

## 2017-04-20 MED ORDER — BISACODYL 5 MG PO TBEC: 5.0000 mg | DELAYED_RELEASE_TABLET | Freq: Every day | ORAL | Status: DC | PRN

## 2017-04-20 MED ORDER — SODIUM CHLORIDE 0.9 % IV SOLN
250.0000 mL | INTRAVENOUS | Status: DC
  Administered 2017-04-20: 250 mL via INTRAVENOUS

## 2017-04-20 MED ORDER — MIDAZOLAM HCL 2 MG/2ML IJ SOLN
INTRAMUSCULAR | Status: AC
  Filled 2017-04-20: qty 2

## 2017-04-20 MED ORDER — PROPOFOL 10 MG/ML IV BOLUS
INTRAVENOUS | Status: AC
  Filled 2017-04-20: qty 20

## 2017-04-20 MED ORDER — CENTRUM SILVER 50+MEN PO TABS: ORAL_TABLET | Freq: Every day | ORAL | Status: DC

## 2017-04-20 MED ORDER — 0.9 % SODIUM CHLORIDE (POUR BTL) OPTIME
TOPICAL | Status: DC | PRN
  Administered 2017-04-20: 1000 mL

## 2017-04-20 MED ORDER — DIAZEPAM 5 MG PO TABS
5.0000 mg | ORAL_TABLET | Freq: Four times a day (QID) | ORAL | Status: DC | PRN
  Administered 2017-04-20 – 2017-04-21 (×5): 5 mg via ORAL
  Filled 2017-04-20 (×4): qty 1

## 2017-04-20 MED ORDER — ALUM & MAG HYDROXIDE-SIMETH 200-200-20 MG/5ML PO SUSP: 30.0000 mL | Freq: Four times a day (QID) | ORAL | Status: DC | PRN

## 2017-04-20 MED ORDER — POTASSIUM CHLORIDE IN NACL 20-0.9 MEQ/L-% IV SOLN: INTRAVENOUS | Status: DC

## 2017-04-20 MED ORDER — DEXAMETHASONE SODIUM PHOSPHATE 10 MG/ML IJ SOLN
INTRAMUSCULAR | Status: DC | PRN
  Administered 2017-04-20: 10 mg via INTRAVENOUS

## 2017-04-20 MED ORDER — ALLOPURINOL 300 MG PO TABS
300.0000 mg | ORAL_TABLET | Freq: Every day | ORAL | Status: DC
  Administered 2017-04-21: 300 mg via ORAL
  Filled 2017-04-20: qty 1

## 2017-04-20 MED ORDER — OXYCODONE HCL 5 MG/5ML PO SOLN: 5.0000 mg | Freq: Once | ORAL | Status: AC | PRN

## 2017-04-20 MED ORDER — ONDANSETRON HCL 4 MG PO TABS: 4.0000 mg | ORAL_TABLET | Freq: Four times a day (QID) | ORAL | Status: DC | PRN

## 2017-04-20 MED ORDER — DEXAMETHASONE SODIUM PHOSPHATE 10 MG/ML IJ SOLN
10.0000 mg | Freq: Once | INTRAMUSCULAR | Status: AC
  Administered 2017-04-20: 10 mg via INTRAVENOUS
  Filled 2017-04-20: qty 1

## 2017-04-20 SURGICAL SUPPLY — 73 items
BENZOIN TINCTURE PRP APPL 2/3 (GAUZE/BANDAGES/DRESSINGS) ×2 IMPLANT
BIT DRILL NEURO 2X3.1 SFT TUCH (MISCELLANEOUS) ×1 IMPLANT
BIT DRILL SRG 14X2.2XFLT CHK (BIT) ×1 IMPLANT
BIT DRL SRG 14X2.2XFLT CHK (BIT) ×1
BLADE CLIPPER SURG (BLADE) ×2 IMPLANT
BLADE SURG 15 STRL LF DISP TIS (BLADE) ×1 IMPLANT
BLADE SURG 15 STRL SS (BLADE) ×1
BONE VIVIGEN FORMABLE 1.3CC (Bone Implant) ×2 IMPLANT
BUR MATCHSTICK NEURO 3.0 LAGG (BURR) IMPLANT
CARTRIDGE OIL MAESTRO DRILL (MISCELLANEOUS) ×1 IMPLANT
CLOSURE STERI-STRIP 1/4X4 (GAUZE/BANDAGES/DRESSINGS) ×2 IMPLANT
COLLAR CERV LO CONTOUR FIRM DE (SOFTGOODS) IMPLANT
CORDS BIPOLAR (ELECTRODE) ×2 IMPLANT
COVER SURGICAL LIGHT HANDLE (MISCELLANEOUS) ×2 IMPLANT
CRADLE DONUT ADULT HEAD (MISCELLANEOUS) ×2 IMPLANT
DECANTER SPIKE VIAL GLASS SM (MISCELLANEOUS) ×2 IMPLANT
DIFFUSER DRILL AIR PNEUMATIC (MISCELLANEOUS) ×2 IMPLANT
DRAIN JACKSON RD 7FR 3/32 (WOUND CARE) IMPLANT
DRAPE C-ARM 42X72 X-RAY (DRAPES) ×2 IMPLANT
DRAPE POUCH INSTRU U-SHP 10X18 (DRAPES) ×2 IMPLANT
DRAPE SURG 17X23 STRL (DRAPES) ×8 IMPLANT
DRILL BIT SKYLINE 14MM (BIT) ×1
DRILL NEURO 2X3.1 SOFT TOUCH (MISCELLANEOUS) ×2
DURAPREP 26ML APPLICATOR (WOUND CARE) ×2 IMPLANT
ELECT COATED BLADE 2.86 ST (ELECTRODE) ×2 IMPLANT
ELECT REM PT RETURN 9FT ADLT (ELECTROSURGICAL) ×2
ELECTRODE REM PT RTRN 9FT ADLT (ELECTROSURGICAL) ×1 IMPLANT
EVACUATOR SILICONE 100CC (DRAIN) IMPLANT
GAUZE SPONGE 4X4 12PLY STRL (GAUZE/BANDAGES/DRESSINGS) ×2 IMPLANT
GAUZE SPONGE 4X4 16PLY XRAY LF (GAUZE/BANDAGES/DRESSINGS) ×2 IMPLANT
GLOVE BIO SURGEON STRL SZ7 (GLOVE) ×2 IMPLANT
GLOVE BIO SURGEON STRL SZ8 (GLOVE) ×2 IMPLANT
GLOVE BIOGEL PI IND STRL 7.0 (GLOVE) ×2 IMPLANT
GLOVE BIOGEL PI IND STRL 8 (GLOVE) ×1 IMPLANT
GLOVE BIOGEL PI INDICATOR 7.0 (GLOVE) ×2
GLOVE BIOGEL PI INDICATOR 8 (GLOVE) ×1
GOWN STRL REUS W/ TWL LRG LVL3 (GOWN DISPOSABLE) ×1 IMPLANT
GOWN STRL REUS W/ TWL XL LVL3 (GOWN DISPOSABLE) ×1 IMPLANT
GOWN STRL REUS W/TWL LRG LVL3 (GOWN DISPOSABLE) ×1
GOWN STRL REUS W/TWL XL LVL3 (GOWN DISPOSABLE) ×1
INTERLOCK LRDTC CRVCL VBR 6MM (Bone Implant) ×2 IMPLANT
IV CATH 14GX2 1/4 (CATHETERS) ×2 IMPLANT
KIT BASIN OR (CUSTOM PROCEDURE TRAY) ×2 IMPLANT
KIT ROOM TURNOVER OR (KITS) ×2 IMPLANT
LORDOTIC CERVICAL VBR 6MM SM (Bone Implant) ×4 IMPLANT
MANIFOLD NEPTUNE II (INSTRUMENTS) ×2 IMPLANT
NEEDLE PRECISIONGLIDE 27X1.5 (NEEDLE) ×2 IMPLANT
NEEDLE SPNL 20GX3.5 QUINCKE YW (NEEDLE) ×2 IMPLANT
NS IRRIG 1000ML POUR BTL (IV SOLUTION) ×2 IMPLANT
OIL CARTRIDGE MAESTRO DRILL (MISCELLANEOUS) ×2
PACK ORTHO CERVICAL (CUSTOM PROCEDURE TRAY) ×2 IMPLANT
PAD ARMBOARD 7.5X6 YLW CONV (MISCELLANEOUS) ×4 IMPLANT
PATTIES SURGICAL .5 X.5 (GAUZE/BANDAGES/DRESSINGS) IMPLANT
PATTIES SURGICAL .5 X1 (DISPOSABLE) ×2 IMPLANT
PIN DISTRACTION 14 (PIN) ×4 IMPLANT
PLATE TWO LEVEL SKYLINE 30MM (Plate) ×2 IMPLANT
SCREW SKYLINE VAR OS 14MM (Screw) ×12 IMPLANT
SPONGE INTESTINAL PEANUT (DISPOSABLE) ×2 IMPLANT
SPONGE SURGIFOAM ABS GEL 100 (HEMOSTASIS) ×2 IMPLANT
STRIP CLOSURE SKIN 1/2X4 (GAUZE/BANDAGES/DRESSINGS) ×2 IMPLANT
SURGIFLO W/THROMBIN 8M KIT (HEMOSTASIS) IMPLANT
SUT MNCRL AB 4-0 PS2 18 (SUTURE) ×2 IMPLANT
SUT SILK 4 0 (SUTURE)
SUT SILK 4-0 18XBRD TIE 12 (SUTURE) IMPLANT
SUT VIC AB 2-0 CT2 18 VCP726D (SUTURE) ×2 IMPLANT
SYR BULB IRRIGATION 50ML (SYRINGE) ×2 IMPLANT
SYR CONTROL 10ML LL (SYRINGE) ×6 IMPLANT
TAPE CLOTH 4X10 WHT NS (GAUZE/BANDAGES/DRESSINGS) ×2 IMPLANT
TAPE UMBILICAL COTTON 1/8X30 (MISCELLANEOUS) ×2 IMPLANT
TOWEL OR 17X24 6PK STRL BLUE (TOWEL DISPOSABLE) ×2 IMPLANT
TOWEL OR 17X26 10 PK STRL BLUE (TOWEL DISPOSABLE) ×2 IMPLANT
WATER STERILE IRR 1000ML POUR (IV SOLUTION) ×2 IMPLANT
YANKAUER SUCT BULB TIP NO VENT (SUCTIONS) ×2 IMPLANT

## 2017-04-20 NOTE — Evaluation (Signed)
Physical Therapy Evaluation Patient Details Name: Ronald Rice MRN: [id number] DOB: 30-Dec-1945 Today's Date: 04/20/2017   History of Present Illness  Pt is a 71 y/o male s/p C3-5 ACDF. PMH includes leukemia, CAD, DM, HTN, back surgery, and CHF.   Clinical Impression  Pt is s/p surgery above with deficits below. PTA, pt was using rollator for ambulation secondary to LE weakness. Upon eval, pt presenting with post op pain and reporting increased weakness in LEs. Reports RLE feels weaker than LLE, however, during gait noted LLE buckling and LLE functional weakness >RLE. Pt requiring multiple standing rest breaks secondary to increased fatigue. Educated about use of RW to increase stability with mobility. Required min to min guard assist with mobility and only able to tolerated limited ambulation distance. Recommending OT eval to address safety with ADLs. Recommending HHPT at d/c to address safety with mobility. Will continue to follow acutely to maximize functional mobility independence and safety.     Follow Up Recommendations Home health PT;Supervision/Assistance - 24 hour    Equipment Recommendations  3in1 (PT)    Recommendations for Other Services       Precautions / Restrictions Precautions Precautions: Cervical Precaution Comments: Reviewed cervical precautions with pt.  Required Braces or Orthoses: Cervical Brace Cervical Brace: Hard collar Restrictions Weight Bearing Restrictions: No      Mobility  Bed Mobility Overal bed mobility: Needs Assistance Bed Mobility: Rolling;Sidelying to Sit;Sit to Sidelying Rolling: Min guard Sidelying to sit: Min guard     Sit to sidelying: Min guard General bed mobility comments: Min guard to ensure log roll technique. Verbal cues for sequencing.   Transfers Overall transfer level: Needs assistance Equipment used: 4-wheeled walker Transfers: Sit to/from Stand Sit to Stand: Min assist         General transfer comment: Min A for  lift assist and steadying upon standing.   Ambulation/Gait Ambulation/Gait assistance: Min assist;Min guard Ambulation Distance (Feet): 75 Feet Assistive device: 4-wheeled walker Gait Pattern/deviations: Step-through pattern;Decreased stride length Gait velocity: Decreased Gait velocity interpretation: Below normal speed for age/gender General Gait Details: Slow, unsteady gait. Pt reporting increased weakness in LEs. Reports RLE is weaker than LLE, however, noted L knee buckling during gait, and increased functional weakness in LLE vs RLE. Educated about using RW at home to increase stability and pt agreeable. Educated about generalized walking program at home.   Stairs            Wheelchair Mobility    Modified Rankin (Stroke Patients Only)       Balance Overall balance assessment: Needs assistance Sitting-balance support: No upper extremity supported;Feet supported Sitting balance-Leahy Scale: Good     Standing balance support: Bilateral upper extremity supported;During functional activity Standing balance-Leahy Scale: Poor Standing balance comment: Reliant on BUE support                              Pertinent Vitals/Pain Pain Assessment: Faces Faces Pain Scale: Hurts even more Pain Location: neck  Pain Descriptors / Indicators: Aching;Operative site guarding Pain Intervention(s): Limited activity within patient's tolerance;Monitored during session;Repositioned    Home Living Family/patient expects to be discharged to:: Private residence Living Arrangements: Spouse/significant other Available Help at Discharge: Family;Available 24 hours/day Type of Home: House Home Access: Stairs to enter Entrance Stairs-Rails: None Entrance Stairs-Number of Steps: 3 Home Layout: One level Home Equipment: Walker - 2 wheels;Walker - 4 wheels;Shower seat      Prior Function Level of  Independence: Independent with assistive device(s)         Comments: Used  rollator for ambulation      Hand Dominance        Extremity/Trunk Assessment   Upper Extremity Assessment Upper Extremity Assessment: Defer to OT evaluation    Lower Extremity Assessment Lower Extremity Assessment: Generalized weakness;LLE deficits/detail LLE Deficits / Details: Noted LLE buckling during gait.     Cervical / Trunk Assessment Cervical / Trunk Assessment: Other exceptions Cervical / Trunk Exceptions: s/p ACDF  Communication   Communication: No difficulties  Cognition Arousal/Alertness: Awake/alert Behavior During Therapy: WFL for tasks assessed/performed Overall Cognitive Status: Within Functional Limits for tasks assessed                                        General Comments      Exercises     Assessment/Plan    PT Assessment Patient needs continued PT services  PT Problem List Decreased strength;Decreased balance;Decreased mobility;Decreased knowledge of use of DME;Decreased knowledge of precautions;Pain;Decreased activity tolerance       PT Treatment Interventions DME instruction;Stair training;Gait training;Functional mobility training;Therapeutic activities;Balance training;Therapeutic exercise;Neuromuscular re-education;Patient/family education    PT Goals (Current goals can be found in the Care Plan section)  Acute Rehab PT Goals Patient Stated Goal: for legs to be stronger.  PT Goal Formulation: With patient Time For Goal Achievement: 04/27/17 Potential to Achieve Goals: Good    Frequency Min 5X/week   Barriers to discharge        Co-evaluation               AM-PAC PT "6 Clicks" Daily Activity  Outcome Measure Difficulty turning over in bed (including adjusting bedclothes, sheets and blankets)?: Unable Difficulty moving from lying on back to sitting on the side of the bed? : Unable Difficulty sitting down on and standing up from a chair with arms (e.g., wheelchair, bedside commode, etc,.)?: Unable Help  needed moving to and from a bed to chair (including a wheelchair)?: A Little Help needed walking in hospital room?: A Little Help needed climbing 3-5 steps with a railing? : A Lot 6 Click Score: 11    End of Session Equipment Utilized During Treatment: Gait belt;Cervical collar Activity Tolerance: Patient limited by fatigue Patient left: in bed;with call bell/phone within reach;with family/visitor present Nurse Communication: Mobility status PT Visit Diagnosis: Unsteadiness on feet (R26.81);Muscle weakness (generalized) (M62.81);Other abnormalities of gait and mobility (R26.89);Pain Pain - part of body: (neck )    Time: 1287-8676 PT Time Calculation (min) (ACUTE ONLY): 28 min   Charges:   PT Evaluation $PT Eval Moderate Complexity: 1 Mod PT Treatments $Gait Training: 8-22 mins   PT G Codes:   PT G-Codes **NOT FOR INPATIENT CLASS** Functional Assessment Tool Used: AM-PAC 6 Clicks Basic Mobility;Clinical judgement Functional Limitation: Mobility: Walking and moving around Mobility: Walking and Moving Around Current Status (H2094): At least 60 percent but less than 80 percent impaired, limited or restricted Mobility: Walking and Moving Around Goal Status [phone]): At least 20 percent but less than 40 percent impaired, limited or restricted    Leighton Ruff, PT, DPT  Acute Rehabilitation Services  Pager: [place] 04/20/2017, 6:31 PM

## 2017-04-20 NOTE — Op Note (Signed)
NAME:  Ronald Rice, Ronald Rice                     ACCOUNT NO.:  MEDICAL RECORD NO.:  [id number]  PHYSICIAN:  Phylliss Bob, MD      DATE OF BIRTH:  1945-10-28  DATE OF PROCEDURE:  04/20/2017                              OPERATIVE REPORT   PREOPERATIVE DIAGNOSES: 1. Cervical myelopathy. 2. Spinal cord compression C3-4, C4-5. 3. Status post previous anterior cervical decompression and fusion C5-     C7.  POSTOPERATIVE DIAGNOSES: 1. Cervical myelopathy. 2. Spinal cord compression C3-4, C4-5. 3. Status post previous anterior cervical decompression and fusion C5-     C7.  PROCEDURE: 1. Anterior cervical decompression and fusion C3-4, C4-5. 2. Placement of anterior instrumentation C3-C5. 3. Use of morselized allograft - ViviGen. 4. Intraoperative use of fluoroscopy. 5. Insertion of interbody device x2 (Titan intervertebral spacers). 6. Complex and meticulous removal of previously placed anterior     instrumentation, C5-C7.  SURGEON:  Phylliss Bob, MD.  ASSISTANTPricilla Holm, PA-C.  ANESTHESIA:  General endotracheal anesthesia.  COMPLICATIONS:  None.  DISPOSITION:  Stable.  ESTIMATED BLOOD LOSS:  Minimal.  INDICATIONS FOR SURGERY:  Briefly, Ronald Rice is a very pleasant 71 year old male, who did present to me with symptoms very much consistent with cervical myelopathy.  Specifically, he did have bilateral arm and leg weakness, which was progressive over the course of the past year.  He did progress to the point of having to walk with a cane and at times, a walker.  The patient's CT myelogram did reveal the findings noted above. Given his significant neurologic symptoms, we did discuss proceeding with the procedure noted above.  The patient was fully aware of the risks and limitations of surgery, including his substantially increased risk of compromise of his esophageal motility.  He did wish to proceed.  OPERATIVE DETAILS:  On April 20, 2017, the patient was brought  to surgery and general endotracheal anesthesia was administered.  The patient was placed supine on the hospital bed.  The patient's neck was gently extended.  His arms were secured to his sides and all bony prominences were padded.  The right neck was then prepped and draped in the usual sterile fashion and a time-out procedure was performed.  A right-sided transverse incision was then made overlying approximately the C4-5 intervertebral space.  The platysma was incised and a Alben Deeds approach was used and the anterior spine was readily noted. The previously placed instrumentation was readily encountered.  At this point, I did proceed with removing the anterior instrumentation.  This portion of the procedure was extremely meticulous and time consuming, as the plate was substantially angled towards the left inferiorly, and as there was substantial scar tissue noted circumferentially around the plate, removing the plate took approximately 60 minutes.  Significant retraction of the esophagus was needed in order to safely remove the plate.  It was, however, removed.  The bone wax was placed in the holes that remained.  I then placed a self-retaining retractor, centered over the C4-5 intervertebral space.  Caspar pins were placed into the C4 and C5 vertebral bodies and distraction was applied across the C4-5 intervertebral space.  At this point, a thorough and complete C4-5 intervertebral diskectomy was performed.  I did ensure complete decompression of the spinal canal and  right and left neural foramina. The endplates were prepared and the appropriate-sized intervertebral spacer was then packed with DBX mix and tamped into position in the usual fashion.  The lower Caspar pin was then removed.  The bone wax was placed in its place.  New Caspar pin was placed into the C3 vertebral body and again, distraction was applied, and once again, a thorough and complete C3-4 intervertebral  diskectomy was performed, with complete decompression of the spinal canal.  Once again, the endplates were prepared and the appropriate-sized intervertebral spacer was packed with ViviGen and tamped into position in the usual fashion.  The Caspar pins were then removed and bone wax was placed in their place.  I then chose the appropriate-sized anterior cervical plate, which was placed over the anterior spine.  The 14-mm variable angle screws were placed, 2 in each vertebral body from C3-C5 for a total of 6 vertebral body screws.  The screws were then locked into the plate using the Cam locking mechanism. The wound was then copiously irrigated.  The wound was then explored for any bleeding, and there were only very minor areas of bleeding which was controlled using bipolar electrocautery.  The wound was then closed in layers using 2-0 Vicryl, followed by 4-0 Monocryl.  Benzoin and Steri- Strips were applied, followed by sterile dressing.  All instrument counts were correct at the termination of the procedure.  Of note, Pricilla Holm, PA-C, was my assistant throughout surgery, and did aid in retraction, suctioning, and closure from start to finish.     Phylliss Bob, MD     MD/MEDQ  D:  04/20/2017  T:  04/20/2017  Job:  [id number]  cc:   Volanda Napoleon, M.D. Lynnell Chad. Shelia Media, M.D.

## 2017-04-20 NOTE — H&P (Signed)
PREOPERATIVE H&P  Chief Complaint: arm and leg weakness  HPI: Ronald Rice is a 71 y.o. male who presents with ongoing weakness in the bilateral arms and legs  CT myelogram reveals spinal cord compression at C3/4 and C4/5  Patient has failed multiple forms of conservative care and continues to have pain (see office notes for additional details regarding the patient's full course of treatment)  Past Medical History:  Diagnosis Date  . Cancer ([place])    Leukemia   . CLL (chronic lymphocytic leukemia) ([place])    see records in care everywhere from [place]  . Coronary artery disease    minimal nonobstructive by 10/31/16 cath at [facility] in [place]  . Diabetes mellitus without complication ([place])   . GERD (gastroesophageal reflux disease)   . Hypertension    Past Surgical History:  Procedure Laterality Date  . BACK SURGERY    . COLONOSCOPY     Social History   Socioeconomic History  . Marital status: Single    Spouse name: None  . Number of children: None  . Years of education: None  . Highest education level: None  Social Needs  . Financial resource strain: None  . Food insecurity - worry: None  . Food insecurity - inability: None  . Transportation needs - medical: None  . Transportation needs - non-medical: None  Occupational History  . None  Tobacco Use  . Smoking status: Never Smoker  . Smokeless tobacco: Never Used  Substance and Sexual Activity  . Alcohol use: Yes    Frequency: Never    Comment: rare   . Drug use: No  . Sexual activity: None  Other Topics Concern  . None  Social History Narrative  . None   History reviewed. No pertinent family history. No Known Allergies Prior to Admission medications   Medication Sig Start Date End Date Taking? Authorizing Provider  amLODipine (NORVASC) 5 MG tablet Take 5 mg by mouth daily.   Yes [provider]  aspirin EC 81 MG tablet Take 81 mg by mouth daily.   Yes [provider]  atenolol  (TENORMIN) 25 MG tablet Take 25 mg by mouth daily.    Yes [provider]  atorvastatin (LIPITOR) 40 MG tablet Take 40 mg by mouth daily.   Yes [provider]  Cinnamon 500 MG TABS Take 500 mg by mouth daily.   Yes [provider]  Ginsengs-Royal Jelly (GINSENG COMPLEX/ROYAL JELLY) 800-200 MG CAPS Take 1 capsule by mouth daily.   Yes [provider]  ibuprofen (ADVIL,MOTRIN) 200 MG tablet Take 400 mg by mouth every 6 (six) hours as needed for headache or moderate pain.   Yes [provider]  lisinopril-hydrochlorothiazide (PRINZIDE,ZESTORETIC) 20-25 MG tablet Take 2 tablets by mouth daily.   Yes [provider]  Multiple Vitamins-Minerals (CENTRUM SILVER 50+MEN PO) Take 1 tablet by mouth daily.   Yes [provider]  NON FORMULARY Take 1 tablet by mouth daily. Blood Sugar Defense    Yes [provider]  Xylometazoline HCl (4-WAY NASAL SPRAY NA) Place 1 spray into both nostrils as needed (for allergies).   Yes [provider]  allopurinol (ZYLOPRIM) 300 MG tablet Take 300 mg by mouth daily.    [provider]     All other systems have been reviewed and were otherwise negative with the exception of those mentioned in the HPI and as above.  Physical Exam: Vitals:   04/20/17 0612  BP: (!) 132/91  Pulse: 78  Resp: 20  Temp: 98 F (36.7 C)  SpO2: 97%    General: Alert, no acute distress Cardiovascular: No pedal edema Respiratory: No cyanosis, no use of accessory musculature Skin: No lesions in the area of chief complaint Neurologic: Sensation intact distally Psychiatric: Patient is competent for consent with normal mood and affect Lymphatic: No axillary or cervical lymphadenopathy  MUSCULOSKELETAL: unsteady wide-based gait  Assessment/Plan: Cervical myelopathy Plan for Procedure(s): ANTERIOR CERVICAL DECOMPRESSION FUSION, CERVICAL 3-4, CERVICAL 4-5 WITH INSTRUMENTATION AND  ALLOGRAFT   Sinclair Ship, MD 04/20/2017 7:07 AM

## 2017-04-20 NOTE — Anesthesia Preprocedure Evaluation (Signed)
Anesthesia Evaluation  Patient identified by MRN, date of birth, ID band Patient awake    Reviewed: Allergy & Precautions, NPO status , Patient's Chart, lab work & pertinent test results  History of Anesthesia Complications Negative for: history of anesthetic complications  Airway Mallampati: II  TM Distance: >3 FB Neck ROM: Full    Dental  (+) Upper Dentures, Partial Lower   Pulmonary neg pulmonary ROS,    breath sounds clear to auscultation       Cardiovascular hypertension, Pt. on medications (-) angina+ CAD and +CHF  (-) dysrhythmias (-) Valvular Problems/Murmurs Rhythm:Regular     Neuro/Psych neg Seizures  Neuromuscular disease    GI/Hepatic Neg liver ROS, GERD  Controlled,  Endo/Other  diabetes, Type 2  Renal/GU Renal InsufficiencyRenal disease     Musculoskeletal   Abdominal   Peds  Hematology   Anesthesia Other Findings   Reproductive/Obstetrics                             Anesthesia Physical Anesthesia Plan  ASA: III  Anesthesia Plan: General   Post-op Pain Management:    Induction: Intravenous  PONV Risk Score and Plan: 2 and Ondansetron and Treatment may vary due to age or medical condition  Airway Management Planned: Oral ETT  Additional Equipment: None  Intra-op Plan:   Post-operative Plan: Extubation in OR  Informed Consent: I have reviewed the patients History and Physical, chart, labs and discussed the procedure including the risks, benefits and alternatives for the proposed anesthesia with the patient or authorized representative who has indicated his/her understanding and acceptance.   Dental advisory given  Plan Discussed with: CRNA and Surgeon  Anesthesia Plan Comments:         Anesthesia Quick Evaluation

## 2017-04-20 NOTE — Transfer of Care (Signed)
Immediate Anesthesia Transfer of Care Note  Patient: Ronald Rice  Procedure(s) Performed: ANTERIOR CERVICAL DECOMPRESSION FUSION, CERVICAL 3-4, CERVICAL 4-5 WITH INSTRUMENTATION AND ALLOGRAFT; REQUEST 3.5 HOURS (N/A )  Patient Location: PACU  Anesthesia Type:General  Level of Consciousness: awake  Airway & Oxygen Therapy: Patient Spontanous Breathing and Patient connected to nasal cannula oxygen  Post-op Assessment: Report given to RN and Post -op Vital signs reviewed and stable  Post vital signs: Reviewed and stable  Last Vitals:  Vitals:   04/20/17 0612 04/20/17 1143  BP: (!) 132/91 (!) 148/94  Pulse: 78 89  Resp: 20 15  Temp: 36.7 C (!) 36.3 C  SpO2: 97% 96%    Last Pain:  Vitals:   04/20/17 1143  TempSrc:   PainSc: Asleep         Complications: No apparent anesthesia complications

## 2017-04-20 NOTE — Progress Notes (Signed)
Orthopedic Tech Progress Note Patient Details:  Ronald Rice [birth year] [id number]  Ortho Devices Type of Ortho Device: [place] cervical collar Ortho Device/Splint Location: neck Ortho Device/Splint Interventions: Loanne Drilling, Puneet Masoner 04/20/2017, 2:32 PM

## 2017-04-20 NOTE — Progress Notes (Signed)
Orthopedic Tech Progress Note Patient Details:  Ronald Rice 10/23/1945 [id number]  Ortho Devices Type of Ortho Device: [place] cervical collar Ortho Device/Splint Location: neck Ortho Device/Splint Interventions: Loanne Drilling, Isabeau Mccalla 04/20/2017, 2:31 PM

## 2017-04-20 NOTE — Anesthesia Procedure Notes (Signed)
Procedure Name: Intubation Date/Time: 04/20/2017 7:49 AM Performed by: Purvis Kilts, CRNA Pre-anesthesia Checklist: Patient identified, Emergency Drugs available, Suction available, Patient being monitored and Timeout performed Patient Re-evaluated:Patient Re-evaluated prior to induction Oxygen Delivery Method: Circle system utilized Preoxygenation: Pre-oxygenation with 100% oxygen Induction Type: IV induction Ventilation: Mask ventilation without difficulty Laryngoscope Size: Mac and 4 Grade View: Grade II Tube type: Oral Tube size: 7.0 mm Number of attempts: 1 Airway Equipment and Method: Stylet Placement Confirmation: ETT inserted through vocal cords under direct vision,  positive ETCO2 and breath sounds checked- equal and bilateral Secured at: 22 cm Tube secured with: Tape Dental Injury: Teeth and Oropharynx as per pre-operative assessment

## 2017-04-21 ENCOUNTER — Encounter (HOSPITAL_COMMUNITY): Payer: Self-pay | Admitting: Orthopedic Surgery

## 2017-04-21 DIAGNOSIS — G959 Disease of spinal cord, unspecified: Secondary | ICD-10-CM | POA: Diagnosis not present

## 2017-04-21 LAB — GLUCOSE, CAPILLARY: Glucose-Capillary: 182 mg/dL — ABNORMAL HIGH (ref 65–99)

## 2017-04-21 NOTE — Progress Notes (Signed)
    Patient doing well Patient reports improvement in balance and hand numbness, feels more steady on his feet Has been tolerating PO well   Physical Exam: Vitals:   04/21/17 0400 04/21/17 0742  BP: 120/76 115/79  Pulse: 91 94  Resp: 18 18  Temp: 97.6 F (36.4 C) 98 F (36.7 C)  SpO2: 95% 95%   Neck soft/supple Dressing in place NVI  POD #1 s/p C3-5 ACDF with improved myelopathic symptoms  - encourage ambulation - Percocet for pain, Valium for muscle spasms - d/c home today wtih f/u in 2 weeks - patient had various question about his right hip and about a right kidney cyst. I informed him that questions about his cyst will need to be addressed with his kidney doctor, and that his right hip can be looked into in closer detail at about 6 weeks after his neck surgery

## 2017-04-21 NOTE — Progress Notes (Signed)
Physical Therapy Treatment Patient Details Name: Ronald Rice MRN: [id number] DOB: [birth year] Today's Date: 04/21/2017    History of Present Illness Pt is a 71 y/o male s/p C3-5 ACDF. PMH includes leukemia, CAD, DM, HTN, back surgery, and CHF.     PT Comments    Pt making steady progress with mobility and with improved posture using RW. Pt continues to be limited secondary to fatigue. Pt would continue to benefit from skilled physical therapy services at this time while admitted and after d/c to address the below listed limitations in order to improve overall safety and independence with functional mobility.   Follow Up Recommendations  Home health PT;Supervision/Assistance - 24 hour     Equipment Recommendations  3in1 (PT)    Recommendations for Other Services       Precautions / Restrictions Precautions Precautions: Cervical Precaution Comments: Reviewed cervical precautions with pt.  Required Braces or Orthoses: Cervical Brace Cervical Brace: Hard collar;Other (comment)([place] collar for showers) Restrictions Weight Bearing Restrictions: No    Mobility  Bed Mobility Overal bed mobility: Needs Assistance Bed Mobility: Supine to Sit     Supine to sit: Supervision;HOB elevated     General bed mobility comments: supervision for safety  Transfers Overall transfer level: Needs assistance Equipment used: Rolling walker (2 wheeled) Transfers: Sit to/from Stand Sit to Stand: Min guard;From elevated surface         General transfer comment: min guard for safety, multiple attempts from standard bed position, requiring raised bed  Ambulation/Gait Ambulation/Gait assistance: Min guard Ambulation Distance (Feet): 150 Feet Assistive device: Rolling walker (2 wheeled) Gait Pattern/deviations: Step-through pattern;Decreased stride length Gait velocity: Decreased Gait velocity interpretation: Below normal speed for age/gender General Gait Details: mild instability with  ambulation with use of RW; pt also requiring occasional rest break secondary to fatigue, close min guard for safety   Stairs            Wheelchair Mobility    Modified Rankin (Stroke Patients Only)       Balance Overall balance assessment: Needs assistance Sitting-balance support: Feet supported;No upper extremity supported Sitting balance-Leahy Scale: Good     Standing balance support: During functional activity Standing balance-Leahy Scale: Fair                              Cognition Arousal/Alertness: Awake/alert Behavior During Therapy: WFL for tasks assessed/performed Overall Cognitive Status: Within Functional Limits for tasks assessed                                        Exercises      General Comments        Pertinent Vitals/Pain Pain Assessment: Faces Faces Pain Scale: No hurt Pain Location: neck Pain Descriptors / Indicators: Discomfort;Sore Pain Intervention(s): Monitored during session    Home Living Family/patient expects to be discharged to:: Private residence Living Arrangements: Spouse/significant other;Children Available Help at Discharge: Family;Available 24 hours/day Type of Home: House Home Access: Stairs to enter Entrance Stairs-Rails: None Home Layout: Two level;Other (Comment)(pt lives in basement) Home Equipment: Walker - 2 wheels;Walker - 4 wheels;Shower seat;Cane - single point      Prior Function Level of Independence: Independent with assistive device(s)      Comments: Used rollator for ambulation    PT Goals (current goals can now be found in the care plan section)  Acute Rehab PT Goals Patient Stated Goal: for legs to be stronger.  PT Goal Formulation: With patient Time For Goal Achievement: 04/27/17 Potential to Achieve Goals: Good Progress towards PT goals: Progressing toward goals    Frequency    Min 5X/week      PT Plan Current plan remains appropriate    Co-evaluation               AM-PAC PT "6 Clicks" Daily Activity  Outcome Measure  Difficulty turning over in bed (including adjusting bedclothes, sheets and blankets)?: None Difficulty moving from lying on back to sitting on the side of the bed? : None Difficulty sitting down on and standing up from a chair with arms (e.g., wheelchair, bedside commode, etc,.)?: Unable Help needed moving to and from a bed to chair (including a wheelchair)?: A Little Help needed walking in hospital room?: A Little Help needed climbing 3-5 steps with a railing? : A Little 6 Click Score: 18    End of Session Equipment Utilized During Treatment: Gait belt;Cervical collar Activity Tolerance: Patient limited by fatigue Patient left: with call bell/phone within reach;with family/visitor present;Other (comment)(sitting EOB) Nurse Communication: Mobility status PT Visit Diagnosis: Unsteadiness on feet (R26.81);Muscle weakness (generalized) (M62.81);Other abnormalities of gait and mobility (R26.89);Pain Pain - part of body: (neck)     Time: 6568-1275 PT Time Calculation (min) (ACUTE ONLY): 15 min  Charges:  $Gait Training: 8-22 mins                    G Codes:       [place], [place], [place] 04/21/2017, 9:54 AM

## 2017-04-21 NOTE — Plan of Care (Signed)
  Progressing Activity: Ability to avoid complications of mobility impairment will improve 04/21/2017 0616 - Progressing by Jonnie Finner, RN Ability to tolerate increased activity will improve 04/21/2017 0616 - Progressing by Jonnie Finner, RN Will remain free from falls 04/21/2017 0616 - Progressing by Jonnie Finner, RN Education: Ability to verbalize activity precautions or restrictions will improve 04/21/2017 0616 - Progressing by Jonnie Finner, RN Knowledge of the prescribed therapeutic regimen will improve 04/21/2017 0616 - Progressing by Jonnie Finner, RN Understanding of discharge needs will improve 04/21/2017 0616 - Progressing by Jonnie Finner, RN Physical Regulation: Ability to maintain clinical measurements within normal limits will improve 04/21/2017 0616 - Progressing by Jonnie Finner, RN Postoperative complications will be avoided or minimized 04/21/2017 0616 - Progressing by Jonnie Finner, RN Pain Management: Pain level will decrease 04/21/2017 0616 - Progressing by Jonnie Finner, RN Health Behavior/Discharge Planning: Identification of resources available to assist in meeting health care needs will improve 04/21/2017 0616 - Progressing by Jonnie Finner, RN

## 2017-04-21 NOTE — Discharge Summary (Signed)
Patient ID: Ronald Rice MRN: [id number] DOB/AGE: 69-Feb-1947 69 y.o.  Admit date: 04/20/2017 Discharge date: 04/21/2017  Admission Diagnoses:  Active Problems:   Radiculopathy   Discharge Diagnoses:  Same  Past Medical History:  Diagnosis Date  . Cancer ([place])    Leukemia   . CLL (chronic lymphocytic leukemia) ([place])    see records in care everywhere from [place]  . Coronary artery disease    minimal nonobstructive by 10/31/16 cath at [facility] in [place]  . Diabetes mellitus without complication ([place])   . GERD (gastroesophageal reflux disease)   . Hypertension     Surgeries: Procedure(s): ANTERIOR CERVICAL DECOMPRESSION FUSION, CERVICAL 3-4, CERVICAL 4-5 WITH INSTRUMENTATION AND ALLOGRAFT; REQUEST 3.5 HOURS on 04/20/2017   Consultants: None  Discharged Condition: Improved  Hospital Course: Ronald Rice is an 71 y.o. male who was admitted 04/20/2017 for operative treatment of myelopathy. Patient has severe unremitting pain that affects sleep, daily activities, and work/hobbies. After pre-op clearance the patient was taken to the operating room on 04/20/2017 and underwent  Procedure(s): ANTERIOR CERVICAL DECOMPRESSION FUSION, CERVICAL 3-4, CERVICAL 4-5 WITH INSTRUMENTATION AND ALLOGRAFT; REQUEST 3.5 HOURS.    Patient was given perioperative antibiotics:  Anti-infectives (From admission, onward)   Start     Dose/Rate Route Frequency Ordered Stop   04/20/17 1600  ceFAZolin (ANCEF) IVPB 2g/100 mL premix     2 g 200 mL/hr over 30 Minutes Intravenous Every 8 hours 04/20/17 1342 04/21/17 0021   04/20/17 0600  ceFAZolin (ANCEF) 3 g in dextrose 5 % 50 mL IVPB     3 g 130 mL/hr over 30 Minutes Intravenous On call to O.R. 04/19/17 5916 04/20/17 0754       Patient was given sequential compression devices, early ambulation to prevent DVT.  Patient benefited maximally from hospital stay and there were no complications.    Recent vital signs:  Patient Vitals for the past 24  hrs:  BP Temp Temp src Pulse Resp SpO2  04/21/17 0742 115/79 98 F (36.7 C) Oral 94 18 95 %  04/21/17 0400 120/76 97.6 F (36.4 C) Oral 91 18 95 %  04/20/17 2325 135/77 98.7 F (37.1 C) Oral 94 20 94 %  04/20/17 2000 129/83 97.8 F (36.6 C) Oral (!) 101 20 94 %  04/20/17 1555 139/74 97.6 F (36.4 C) - 89 16 94 %  04/20/17 1350 135/86 97.6 F (36.4 C) Oral 86 18 97 %  04/20/17 1327 (!) 132/94 98 F (36.7 C) - 75 15 98 %  04/20/17 1312 138/86 - - 79 15 100 %  04/20/17 1304 (!) 144/86 - - 77 15 100 %  04/20/17 1257 (!) 145/106 - - 78 17 99 %  04/20/17 1245 (!) 168/90 - - 79 16 100 %  04/20/17 1242 - - - 79 - 99 %  04/20/17 1227 (!) 158/98 - - 84 19 100 %  04/20/17 1220 (!) 161/103 - - 83 (!) 21 99 %  04/20/17 1200 (!) 158/102 - - 87 19 99 %  04/20/17 1143 (!) 148/94 (!) 97.4 F (36.3 C) - 89 15 96 %     Discharge Medications:   Allergies as of 04/21/2017   No Known Allergies     Medication List    TAKE these medications   4-WAY NASAL SPRAY NA Place 1 spray into both nostrils as needed (for allergies).   allopurinol 300 MG tablet Commonly known as:  ZYLOPRIM Take 300 mg by mouth daily.   amLODipine 5  MG tablet Commonly known as:  NORVASC Take 5 mg by mouth daily.   aspirin EC 81 MG tablet Take 81 mg by mouth daily.   atenolol 25 MG tablet Commonly known as:  TENORMIN Take 25 mg by mouth daily.   atorvastatin 40 MG tablet Commonly known as:  LIPITOR Take 40 mg by mouth daily.   CENTRUM SILVER 50+MEN PO Take 1 tablet by mouth daily.   Cinnamon 500 MG Tabs Take 500 mg by mouth daily.   Ginseng Complex/Royal Jelly 800-200 MG Caps Take 1 capsule by mouth daily.   lisinopril-hydrochlorothiazide 20-25 MG tablet Commonly known as:  PRINZIDE,ZESTORETIC Take 2 tablets by mouth daily.   NON FORMULARY Take 1 tablet by mouth daily. Blood Sugar Defense       Diagnostic Studies: Dg Chest 2 View  Result Date: 04/19/2017 CLINICAL DATA:  Back surgery. EXAM:  CHEST  2 VIEW COMPARISON:  05/22/2016.  MRI 05/22/2016 . FINDINGS: Mediastinum and hilar structures are normal. Heart size normal. Low lung volumes. No focal infiltrate. No pleural effusion or pneumothorax. Cervicothoracic spine fusion . Prior vertebroplasty upper lumbar vertebral body again noted . IMPRESSION: Low lung volumes. No acute cardiopulmonary disease. Chest is stable from prior exam. Electronically Signed   By: Marcello Moores  Register   On: 04/19/2017 06:26   Dg Cervical Spine 2-3 Views  Result Date: 04/20/2017 CLINICAL DATA:  ACDF C3-4, C4-5 EXAM: CERVICAL SPINE - 2-3 VIEW; DG C-ARM 61-120 MIN COMPARISON:  04/20/2017 FINDINGS: First lateral intraoperative image demonstrates an anterior instrument directed at the C4 vertebral body. Second intraoperative image demonstrates changes of anterior fusion from C3-C5. The C5 vertebral body is not visualized due to overlying shoulders. No visible complicating feature. IMPRESSION: C3-C5 ACDF. No visible complicating features. C5 cannot be visualized due to overlying shoulders. Electronically Signed   By: Rolm Baptise M.D.   On: 04/20/2017 11:44   Dg C-arm 1-60 Min  Result Date: 04/20/2017 CLINICAL DATA:  ACDF C3-4, C4-5 EXAM: CERVICAL SPINE - 2-3 VIEW; DG C-ARM 61-120 MIN COMPARISON:  04/20/2017 FINDINGS: First lateral intraoperative image demonstrates an anterior instrument directed at the C4 vertebral body. Second intraoperative image demonstrates changes of anterior fusion from C3-C5. The C5 vertebral body is not visualized due to overlying shoulders. No visible complicating feature. IMPRESSION: C3-C5 ACDF. No visible complicating features. C5 cannot be visualized due to overlying shoulders. Electronically Signed   By: Rolm Baptise M.D.   On: 04/20/2017 11:44    Disposition: Final discharge disposition not confirmed  Discharge Instructions    Discharge patient   Complete by:  As directed    Discharge disposition:  01-Home or Self Care   Discharge  patient date:  04/21/2017     POD #1 s/p C3-5 ACDF with improved myelopathic symptoms  - encourage ambulation - Percocet for pain, Valium for muscle spasms - Patient had various question about his right hip and about a right kidney cyst. I informed him that questions about his cyst will need to be addressed with his kidney doctor, and that his right hip can be looked into in closer detail at about 6 weeks after his neck surgery -Written scripts for pain signed and in chart -D/C instructions sheet printed and in chart -D/C today  -F/U in office 2 weeks   Signed: Justice Britain 04/21/2017, 8:45 AM

## 2017-04-21 NOTE — Progress Notes (Signed)
Patient alert and oriented, mae's well, voiding adequate amount of urine, swallowing without difficulty, no c/o pain at time of discharge. Patient discharged home with family. Script and discharged instructions given to patient. Patient and family stated understanding of instructions given. Patient has an appointment with Dr. Dumonski 

## 2017-04-21 NOTE — Evaluation (Addendum)
Occupational Therapy Evaluation and Discharge Patient Details Name: Ronald Rice MRN: [id number] DOB: Feb 25, 1946 Today's Date: 04/21/2017    History of Present Illness Pt is a 71 y/o male s/p C3-5 ACDF. PMH includes leukemia, CAD, DM, HTN, back surgery, and CHF.    Clinical Impression   Pt reports he was modified independent with ADL PTA. Currently pt min guard with ADL and functional mobility. All neck, safety, and ADL education completed with pt. Pt planning to d/c home with 24/7 supervision from family. No further acute OT needs identified; signing off at this time. Please re-consult if needs change. Thank you for this referral.    Follow Up Recommendations  No OT follow up;Supervision/Assistance - 24 hour    Equipment Recommendations  None recommended by OT    Recommendations for Other Services       Precautions / Restrictions Precautions Precautions: Cervical Precaution Comments: Reviewed cervical precautions with pt.  Required Braces or Orthoses: Cervical Brace Cervical Brace: Hard collar;Other (comment)(Philly collar for showers) Restrictions Weight Bearing Restrictions: No      Mobility Bed Mobility Overal bed mobility: Needs Assistance Bed Mobility: Supine to Sit     Supine to sit: Supervision;HOB elevated     General bed mobility comments: Educated pt on log roll technique; he verbalized understanding. Pt able to come into sitting with HOB elevated and supervision for safety  Transfers Overall transfer level: Needs assistance Equipment used: Rolling walker (2 wheeled) Transfers: Sit to/from Stand Sit to Stand: Min guard         General transfer comment: For safety initially. Good hand placement and technique     Balance Overall balance assessment: Needs assistance Sitting-balance support: Feet supported;No upper extremity supported Sitting balance-Leahy Scale: Good     Standing balance support: Single extremity supported;During functional  activity Standing balance-Leahy Scale: Fair                             ADL either performed or assessed with clinical judgement   ADL Overall ADL's : Needs assistance/impaired Eating/Feeding: Set up;Sitting   Grooming: Supervision/safety;Standing Grooming Details (indicate cue type and reason): Educated on use of 2 cups for oral care Upper Body Bathing: Set up;Supervision/ safety;Sitting   Lower Body Bathing: Min guard;Sit to/from stand   Upper Body Dressing : Set up;Supervision/safety;Sitting Upper Body Dressing Details (indicate cue type and reason): Educated on cervical collar management and use of philly collar for showering Lower Body Dressing: Min guard;Sit to/from stand Lower Body Dressing Details (indicate cue type and reason): Pt able to cross foot over opposite knee. Educated on use of compensatory strategies for LB ADL. Toilet Transfer: Min guard;Ambulation;Regular Toilet;RW Toilet Transfer Details (indicate cue type and reason): Simulated by sit to stand from EOB with functional mobillity     Tub/ Shower Transfer: Min guard;Tub transfer;Ambulation;Shower Technical sales engineer Details (indicate cue type and reason): Simulated in room with min guard assist. Recommend supervision for transfers initially with use of shower chair; pt agreeable. Functional mobility during ADLs: Min guard;Rolling walker General ADL Comments: Educated pt on maintaining cervical precautions during functional activities, frequent mobility, and encouraged functional independence as much as possible.     Vision         Perception     Praxis      Pertinent Vitals/Pain Pain Assessment: Faces Faces Pain Scale: Hurts little more Pain Location: neck Pain Descriptors / Indicators: Discomfort;Sore Pain Intervention(s): Monitored during session  Hand Dominance Right   Extremity/Trunk Assessment Upper Extremity Assessment Upper Extremity Assessment: Overall  WFL for tasks assessed   Lower Extremity Assessment Lower Extremity Assessment: Defer to PT evaluation   Cervical / Trunk Assessment Cervical / Trunk Assessment: Other exceptions Cervical / Trunk Exceptions: s/p ACDF   Communication Communication Communication: No difficulties   Cognition Arousal/Alertness: Awake/alert Behavior During Therapy: WFL for tasks assessed/performed Overall Cognitive Status: Within Functional Limits for tasks assessed                                     General Comments       Exercises     Shoulder Instructions      Home Living Family/patient expects to be discharged to:: Private residence Living Arrangements: Spouse/significant other;Children Available Help at Discharge: Family;Available 24 hours/day Type of Home: House Home Access: Stairs to enter [employer] of Steps: 3 Entrance Stairs-Rails: None Home Layout: Two level;Other (Comment)(pt lives in basement) Alternate Level Stairs-Number of Steps: 13   Bathroom Shower/Tub: Tub/shower unit;Curtain   Biochemist, clinical: Standard     Home Equipment: Environmental consultant - 2 wheels;Walker - 4 wheels;Shower seat;Cane - single point          Prior Functioning/Environment Level of Independence: Independent with assistive device(s)        Comments: Used rollator for ambulation         OT Problem List:        OT Treatment/Interventions:      OT Goals(Current goals can be found in the care plan section) Acute Rehab OT Goals Patient Stated Goal: for legs to be stronger.  OT Goal Formulation: All assessment and education complete, DC therapy  OT Frequency:     Barriers to D/C:            Co-evaluation              AM-PAC PT "6 Clicks" Daily Activity     Outcome Measure Help from another person eating meals?: None Help from another person taking care of personal grooming?: A Little Help from another person toileting, which includes using toliet, bedpan, or  urinal?: A Little Help from another person bathing (including washing, rinsing, drying)?: A Little Help from another person to put on and taking off regular upper body clothing?: None Help from another person to put on and taking off regular lower body clothing?: A Little 6 Click Score: 20   End of Session Equipment Utilized During Treatment: Rolling walker;Cervical collar Nurse Communication: Mobility status;Other (comment)(no equipment or f/u needs, pt needs philly collar)  Activity Tolerance: Patient tolerated treatment well Patient left: in bed;with call bell/phone within reach;with family/visitor present  OT Visit Diagnosis: Other abnormalities of gait and mobility (R26.89);Unsteadiness on feet (R26.81)                Time: 1610-9604 OT Time Calculation (min): 13 min Charges:  OT General Charges $OT Visit: 1 Visit OT Evaluation $OT Eval Moderate Complexity: 1 Mod G-Codes: OT G-codes **NOT FOR INPATIENT CLASS** Functional Assessment Tool Used: Clinical judgement Functional Limitation: Self care Self Care Current Status (V4098): At least 20 percent but less than 40 percent impaired, limited or restricted Self Care Goal Status (J1914): At least 20 percent but less than 40 percent impaired, limited or restricted Self Care Discharge Status [phone]): At least 20 percent but less than 40 percent impaired, limited or restricted   Mel Almond A. Stalin Gruenberg,  M.S., OTR/L Pager: [place] 04/21/2017, 9:16 AM

## 2017-04-22 NOTE — Anesthesia Postprocedure Evaluation (Signed)
Anesthesia Post Note  Patient: Ronald Rice  Procedure(s) Performed: ANTERIOR CERVICAL DECOMPRESSION FUSION, CERVICAL 3-4, CERVICAL 4-5 WITH INSTRUMENTATION AND ALLOGRAFT; REQUEST 3.5 HOURS (N/A )     Patient location during evaluation: PACU Anesthesia Type: General Level of consciousness: awake and alert Pain management: pain level controlled Vital Signs Assessment: post-procedure vital signs reviewed and stable Respiratory status: spontaneous breathing, nonlabored ventilation, respiratory function stable and patient connected to nasal cannula oxygen Cardiovascular status: blood pressure returned to baseline and stable Postop Assessment: no apparent nausea or vomiting Anesthetic complications: no    Last Vitals:  Vitals:   04/21/17 0400 04/21/17 0742  BP: 120/76 115/79  Pulse: 91 94  Resp: 18 18  Temp: 36.4 C 36.7 C  SpO2: 95% 95%    Last Pain:  Vitals:   04/21/17 1102  TempSrc:   PainSc: 3                  Taressa Rauh

## 2017-04-25 ENCOUNTER — Ambulatory Visit: Payer: Medicare Other | Admitting: Neurology

## 2017-04-25 ENCOUNTER — Telehealth: Payer: Self-pay

## 2017-04-25 NOTE — Telephone Encounter (Signed)
Pt did not show for their appt with Dr. Athar today.  

## 2017-04-26 ENCOUNTER — Encounter: Payer: Self-pay | Admitting: Neurology

## 2017-05-24 HISTORY — PX: LEG SURGERY: SHX1003

## 2017-05-31 ENCOUNTER — Other Ambulatory Visit: Payer: Self-pay | Admitting: Rheumatology

## 2017-05-31 DIAGNOSIS — M25511 Pain in right shoulder: Secondary | ICD-10-CM

## 2017-06-06 ENCOUNTER — Ambulatory Visit: Payer: Medicare Other | Admitting: Neurology

## 2017-06-08 ENCOUNTER — Ambulatory Visit
Admission: RE | Admit: 2017-06-08 | Discharge: 2017-06-08 | Disposition: A | Discharge status: Home / Self Care | Payer: Medicare Other | Source: Ambulatory Visit | Attending: Rheumatology | Admitting: Rheumatology

## 2017-06-08 DIAGNOSIS — M25511 Pain in right shoulder: Secondary | ICD-10-CM

## 2017-06-18 ENCOUNTER — Other Ambulatory Visit: Payer: Self-pay | Admitting: Orthopedic Surgery

## 2017-06-18 DIAGNOSIS — M5416 Radiculopathy, lumbar region: Secondary | ICD-10-CM

## 2017-06-25 ENCOUNTER — Ambulatory Visit
Admission: RE | Admit: 2017-06-25 | Discharge: 2017-06-25 | Disposition: A | Discharge status: Home / Self Care | Payer: Medicare Other | Source: Ambulatory Visit | Attending: Orthopedic Surgery | Admitting: Orthopedic Surgery

## 2017-06-25 DIAGNOSIS — M5416 Radiculopathy, lumbar region: Secondary | ICD-10-CM

## 2017-07-14 ENCOUNTER — Other Ambulatory Visit: Payer: Self-pay | Admitting: Orthopedic Surgery

## 2017-07-14 DIAGNOSIS — M546 Pain in thoracic spine: Secondary | ICD-10-CM

## 2017-07-18 ENCOUNTER — Ambulatory Visit
Admission: RE | Admit: 2017-07-18 | Discharge: 2017-07-18 | Disposition: A | Discharge status: Home / Self Care | Payer: Medicare Other | Source: Ambulatory Visit | Attending: Neurology | Admitting: Neurology

## 2017-07-18 ENCOUNTER — Telehealth: Payer: Self-pay | Admitting: *Deleted

## 2017-07-18 ENCOUNTER — Ambulatory Visit: Payer: 59 | Admitting: Neurology

## 2017-07-18 ENCOUNTER — Encounter: Payer: Self-pay | Admitting: Neurology

## 2017-07-18 ENCOUNTER — Other Ambulatory Visit: Payer: Self-pay

## 2017-07-18 VITALS — BP 147/89 | HR 79 | Ht 74.0 in | Wt 286.0 lb

## 2017-07-18 DIAGNOSIS — G3281 Cerebellar ataxia in diseases classified elsewhere: Secondary | ICD-10-CM | POA: Diagnosis not present

## 2017-07-18 DIAGNOSIS — M25551 Pain in right hip: Secondary | ICD-10-CM

## 2017-07-18 DIAGNOSIS — M5416 Radiculopathy, lumbar region: Secondary | ICD-10-CM | POA: Diagnosis not present

## 2017-07-18 DIAGNOSIS — R269 Unspecified abnormalities of gait and mobility: Secondary | ICD-10-CM

## 2017-07-18 NOTE — Telephone Encounter (Signed)
Spoke to patient he is aware of results.

## 2017-07-18 NOTE — Progress Notes (Signed)
PATIENT: Ronald Rice DOB: Oct 15, 1945  Chief Complaint  Patient presents with  . New Patient (Initial Visit)    RM EMG 4 with wife.[name] Paper referral from Dr. Lynann Bologna for bilateral lower extremity weakness w/ intermittent rt leg   . PCP    Deland Pretty, MD  . Extremity Weakness    Started several years ago. Denies any injury. Worsened over time. Ambulates with walker.  He fell most recently last week, denies any injuries from fall.     HISTORICAL  Ronald Rice is a 72 year old male, seen in refer by his primary care doctor Deland Pretty for evaluation of bilateral lower extremity weakness, initial evaluation was on July 18, 2017.  He is accompanied by his fiance at today's clinical visit.  I have reviewed and summarized the referring note, he has past medical history of hypertension, hyperlipidemia, gout diabetes, coronary artery disease, obesity  He had multiple lumbar decompression surgery, this was all done in [place], most recent lumbar decompression was in 2016, also had anterior cervical decompression fusion C4-5 with instrumentation and allograft on April 20, 2017, history of chronic lymphocytic leukemia, was treated at [place] with chemotherapy,  He reported gradual onset gait abnormality since 2014, gradually getting worse, despite the surgery has improved paresthesia in the low back pain, not rely on his walker, sometimes radiating pain to right leg, increased weakness in his right leg, also has occasionally bowel incontinence, urinary incontinence.  REVIEW OF SYSTEMS: Full 14 system review of systems performed and notable only for swelling legs, incontinence, urination problems, joint pain  ALLERGIES: No Known Allergies  HOME MEDICATIONS: Current Outpatient Medications  Medication Sig Dispense Refill  . allopurinol (ZYLOPRIM) 300 MG tablet Take 300 mg by mouth daily.    Marland Kitchen amLODipine (NORVASC) 5 MG tablet Take 5 mg by mouth daily.    Marland Kitchen aspirin EC 81 MG  tablet Take 81 mg by mouth daily.    Marland Kitchen atenolol (TENORMIN) 25 MG tablet Take 25 mg by mouth daily.     Marland Kitchen atorvastatin (LIPITOR) 40 MG tablet Take 40 mg by mouth daily.    . Cinnamon 500 MG TABS Take 500 mg by mouth daily.    Jonna Clark Jelly (GINSENG COMPLEX/ROYAL JELLY) 800-200 MG CAPS Take 1 capsule by mouth daily.    Marland Kitchen lisinopril-hydrochlorothiazide (PRINZIDE,ZESTORETIC) 20-25 MG tablet Take 2 tablets by mouth daily.    . Multiple Vitamins-Minerals (CENTRUM SILVER 50+MEN PO) Take 1 tablet by mouth daily.    . NON FORMULARY Take 1 tablet by mouth daily. Blood Sugar Defense     . Xylometazoline HCl (4-WAY NASAL SPRAY NA) Place 1 spray into both nostrils as needed (for allergies).     No current facility-administered medications for this visit.     PAST MEDICAL HISTORY: Past Medical History:  Diagnosis Date  . Cancer ([place])    Leukemia   . CLL (chronic lymphocytic leukemia) ([place])    see records in care everywhere from [place]  . Coronary artery disease    minimal nonobstructive by 10/31/16 cath at [facility] in [place]  . Diabetes mellitus without complication ([place])   . GERD (gastroesophageal reflux disease)   . Hypertension     PAST SURGICAL HISTORY: Past Surgical History:  Procedure Laterality Date  . ANTERIOR CERVICAL DECOMP/DISCECTOMY FUSION N/A 04/20/2017   Procedure: ANTERIOR CERVICAL DECOMPRESSION FUSION, CERVICAL 3-4, CERVICAL 4-5 WITH INSTRUMENTATION AND ALLOGRAFT; REQUEST 3.5 HOURS;  Surgeon: Phylliss Bob, MD;  Location: [place];  Service: Orthopedics;  Laterality: N/A;  ANTERIOR CERVICAL DECOMPRESSION FUSION, CERVICAL 3-4, CERVICAL 4-5 WITH INSTRUMENTATION AND ALLOGRAFT; REQUEST 3.5 HOURS  . BACK SURGERY    . COLONOSCOPY      FAMILY HISTORY: Family History  Problem Relation Age of Onset  . Heart attack Mother     SOCIAL HISTORY:  Social History   Socioeconomic History  . Marital status: Single    Spouse name: Not on file  . Number of children: 1  . Years  of education: 45  . Highest education level: Not on file  Social Needs  . Financial resource strain: Not on file  . Food insecurity - worry: Not on file  . Food insecurity - inability: Not on file  . Transportation needs - medical: Not on file  . Transportation needs - non-medical: Not on file  Occupational History  . Occupation: Retired  Tobacco Use  . Smoking status: Never Smoker  . Smokeless tobacco: Never Used  Substance and Sexual Activity  . Alcohol use: Yes    Frequency: Never    Comment: Rare  . Drug use: No  . Sexual activity: Not on file  Other Topics Concern  . Not on file  Social History Narrative   Lives with wife   Caffeine use: Drinks coffee daily (2 cups)   Right handed      PHYSICAL EXAM   Vitals:   07/18/17 1242  BP: (!) 147/89  Pulse: 79  Weight: 286 lb (129.7 kg)  Height: 6\' 2"  (1.88 m)    Not recorded      Body mass index is 36.72 kg/m.  PHYSICAL EXAMNIATION:  Gen: NAD, conversant, well nourised, obese, well groomed                     Cardiovascular: Regular rate rhythm, no peripheral edema, warm, nontender. Eyes: Conjunctivae clear without exudates or hemorrhage Neck: Supple, no carotid bruits. Pulmonary: Clear to auscultation bilaterally   NEUROLOGICAL EXAM:  MENTAL STATUS: Speech:    Speech is normal; fluent and spontaneous with normal comprehension.  Cognition:     Orientation to time, place and person     Normal recent and remote memory     Normal Attention span and concentration     Normal Language, naming, repeating,spontaneous speech     Fund of knowledge   CRANIAL NERVES: CN II: Visual fields are full to confrontation. Fundoscopic exam is normal with sharp discs and no vascular changes. Pupils are round equal and briskly reactive to light. CN III, IV, VI: extraocular movement are normal. No ptosis. CN V: Facial sensation is intact to pinprick in all 3 divisions bilaterally. Corneal responses are intact.  CN VII: Face  is symmetric with normal eye closure and smile. CN VIII: Hearing is normal to rubbing fingers CN IX, X: Palate elevates symmetrically. Phonation is normal. CN XI: Head turning and shoulder shrug are intact CN XII: Tongue is midline with normal movements and no atrophy.  MOTOR: He has bilateral ankle dorsiflexion weakness, right side is moderate to severe, left side is mild to moderate,  REFLEXES: Reflexes are hypoactive and symmetric at the biceps, triceps, knees, and absent at ankles. Plantar responses are flexor.  SENSORY: Intact to light touch, pinprick, positional sensation and vibratory sensation are intact in fingers and toes.  COORDINATION: Rapid alternating movements and fine finger movements are intact. There is no dysmetria on finger-to-nose and heel-knee-shin.    GAIT/STANCE: He needs pushed up to get up from seated position, dragging his right leg  across the floor, unsteady   DIAGNOSTIC DATA (LABS, IMAGING, TESTING) - I reviewed patient records, labs, notes, testing and imaging myself where available.   ASSESSMENT AND PLAN  Kaian Fahs is a 73 y.o. male   Lumbar radiculopathy, Gait abnormality History of cervical spondylitic myelopathy status post decompression November 2018  Is progressive worsening gait abnormality could due to combination of cervical spondylitic myelopathy, and lumbar radiculopathy  Proceed with MRI of cervical spine, MRI of lumbar  EMG nerve conduction study  Right hip pain, x-ray of right hip    Marcial Pacas, M.D. Ph.D.  [facility] [address] [place], [place] [address] Ph: [phone] Fax: [phone]  FT:DDUKG, Thayer Jew, MD

## 2017-07-18 NOTE — Telephone Encounter (Signed)
-----   Message from Marcial Pacas, MD sent at 07/18/2017  5:27 PM EST ----- Please call patient for no significant abnormality on the right hip x-ray

## 2017-07-30 ENCOUNTER — Ambulatory Visit
Admission: RE | Admit: 2017-07-30 | Discharge: 2017-07-30 | Disposition: A | Discharge status: Home / Self Care | Payer: 59 | Source: Ambulatory Visit | Attending: Neurology | Admitting: Neurology

## 2017-07-30 ENCOUNTER — Ambulatory Visit
Admission: RE | Admit: 2017-07-30 | Discharge: 2017-07-30 | Disposition: A | Discharge status: Home / Self Care | Payer: 59 | Source: Ambulatory Visit | Attending: Orthopedic Surgery | Admitting: Orthopedic Surgery

## 2017-07-30 DIAGNOSIS — M546 Pain in thoracic spine: Secondary | ICD-10-CM

## 2017-07-30 DIAGNOSIS — G3281 Cerebellar ataxia in diseases classified elsewhere: Secondary | ICD-10-CM

## 2017-08-01 ENCOUNTER — Telehealth: Payer: Self-pay | Admitting: Neurology

## 2017-08-01 NOTE — Telephone Encounter (Signed)
Pt is asking to be called once the MRI results are available °

## 2017-08-02 ENCOUNTER — Telehealth: Payer: Self-pay | Admitting: Neurology

## 2017-08-02 NOTE — Telephone Encounter (Signed)
Please call patient, MRI of cervical spine showed evidence of previous surgery C3-7, residual mild spinal stenosis at level above at C3 and 4, Cspinal 7-T1, level below previous surgery, but no evidence of cervical cord compression, there is appears t o be cord atrophy at previous surgical's site  There is also evidence of mild multilevel degenerative changes at the thoracic spine, possibly increase the central cord signal at T6-T7, T7-T8,  Above findings could potentially explain his continued gait abnormality.  IMPRESSION:  This MRI of the cervicl artifact consistent with ACDF is present at C3-C5. All were has been removed from C5-C7.   The fusion at C3-C5 aal spine without contrast shows the following: 1.     There is fusion from C3-C7. Metand metal hardware removal has occurred since the 05/22/2016 MRI.  2.     There is residual mild spinal ste.nosis at C3-C4 and C4-C5, likely due to congenitally short pedicles. Root impingement is noted at these levels 3.     At C7 T1 there is a small central disc herniation. There is no spinal cord compression or nerve root compression at this level. 4.     There appears to be spinal cord atrophy adjacent to C3-C4 and C4-C5. Sagittal STIR images appear to show a small focus of increased signal within the spinal cord adjacent to C4-C5, though this is not confirmed on the axial images.   IMPRESSION: 1. Mild multilevel degenerative disc disease and facet arthropathy of the thoracic spine as described above, overall similar to prior study. Central disc protrusions at T3-T4 and T4-T5 contact the ventral cord. No spinal canal or neuroforaminal stenosis at any level. 2. Questionable increased central cord signal at T6-T7 and T7-T8, slightly more conspicuous when compared to prior study, but likely present then. Given the somewhat atrophic nature of the cord, this may reflect myelomalacia related to chronic cervical spine degenerative disease.

## 2017-08-02 NOTE — Telephone Encounter (Signed)
Spoke to patient and he is aware of results.  He will keep his pending NCV/EMG appt.

## 2017-08-02 NOTE — Telephone Encounter (Signed)
error 

## 2017-08-05 ENCOUNTER — Ambulatory Visit (INDEPENDENT_AMBULATORY_CARE_PROVIDER_SITE_OTHER): Payer: 59 | Admitting: Neurology

## 2017-08-05 DIAGNOSIS — R269 Unspecified abnormalities of gait and mobility: Secondary | ICD-10-CM | POA: Diagnosis not present

## 2017-08-05 DIAGNOSIS — Z0289 Encounter for other administrative examinations: Secondary | ICD-10-CM

## 2017-08-05 DIAGNOSIS — M25551 Pain in right hip: Secondary | ICD-10-CM | POA: Diagnosis not present

## 2017-08-05 DIAGNOSIS — M5416 Radiculopathy, lumbar region: Secondary | ICD-10-CM | POA: Diagnosis not present

## 2017-08-05 MED ORDER — GABAPENTIN 300 MG PO CAPS: 300.0000 mg | ORAL_CAPSULE | Freq: Every day | ORAL | 11 refills | Status: DC

## 2017-08-05 NOTE — Progress Notes (Signed)
PATIENT: Ronald Rice DOB: May 03, 1946  No chief complaint on file.    HISTORICAL  Ronald Rice is a 72 year old male, seen in refer by his primary care doctor Deland Pretty for evaluation of bilateral lower extremity weakness, initial evaluation was on July 18, 2017.  He is accompanied by his fiance at today's clinical visit.  I have reviewed and summarized the referring note, he has past medical history of hypertension, hyperlipidemia, gout diabetes, coronary artery disease, obesity  He had multiple lumbar decompression surgery, this was all done in [place], most recent lumbar decompression was in 2016, also had anterior cervical decompression fusion C4-5 with instrumentation and allograft on April 20, 2017, history of chronic lymphocytic leukemia, was treated at [place] with chemotherapy,  He reported gradual onset gait abnormality since 2014, gradually getting worse, despite the surgery has improved paresthesia in the low back pain, not rely on his walker, sometimes radiating pain to right leg, increased weakness in his right leg, also has occasionally bowel incontinence, urinary incontinence.  UPDATE August 05 2017: We have personally reviewed MRI of cervical spine March 2019, evidence of C3-7 fusion, metal artifact, residual mild stenosis at C3-4, C4-5, but no evidence of cord compression, there appears to be spinal cord atrophy at C3-4, C4-5, sagittal STIR imaging appears to show small foci of increased signal within the spinal cord adjacent to C4-5,  MRI of thoracic spine showed multilevel degenerative disc disease, facet arthropathy of the thoracic spine, central disc protrusion at T3-4, T4-5 contact the ventral cord, no spinal cord and neural foraminal stenosis at this level, questionable increased central cord signal at T6-7, T7-8,  MRI of the lumbar spine in February 2019, status post L3 through S1 posterior decompression and fusion, moderate L1 compression fracture,  status post cement augmentation, stable degenerative changes of lumbar spine, moderate canal stenosis at L2 and 3, most compatible with adjacent segment disease, multilevel neuroforaminal narrowing, moderate on the left at L1-2,  Patient complains of radiating pain from right lower back to right lower extremity, after bearing weight, sometimes with body changing position turning towards the right side, he has persistent bilateral distal ankle weakness,  Today's electrodiagnostic study showed evidence of chronic bilateral lumbosacral radiculopathy, right more obvious than the left side, mainly involving bilateral L3, L4 myotomes. REVIEW OF SYSTEMS: Full 14 system review of systems performed and notable only for swelling legs, incontinence, urination problems, joint pain  ALLERGIES: No Known Allergies  HOME MEDICATIONS: Current Outpatient Medications  Medication Sig Dispense Refill  . allopurinol (ZYLOPRIM) 300 MG tablet Take 300 mg by mouth daily.    Marland Kitchen amLODipine (NORVASC) 5 MG tablet Take 5 mg by mouth daily.    Marland Kitchen aspirin EC 81 MG tablet Take 81 mg by mouth daily.    Marland Kitchen atenolol (TENORMIN) 25 MG tablet Take 25 mg by mouth daily.     Marland Kitchen atorvastatin (LIPITOR) 40 MG tablet Take 40 mg by mouth daily.    . Cinnamon 500 MG TABS Take 500 mg by mouth daily.    Jonna Clark Jelly (GINSENG COMPLEX/ROYAL JELLY) 800-200 MG CAPS Take 1 capsule by mouth daily.    Marland Kitchen lisinopril-hydrochlorothiazide (PRINZIDE,ZESTORETIC) 20-25 MG tablet Take 2 tablets by mouth daily.    . Multiple Vitamins-Minerals (CENTRUM SILVER 50+MEN PO) Take 1 tablet by mouth daily.    . NON FORMULARY Take 1 tablet by mouth daily. Blood Sugar Defense     . Xylometazoline HCl (4-WAY NASAL SPRAY NA) Place 1 spray into both nostrils as  needed (for allergies).     No current facility-administered medications for this visit.     PAST MEDICAL HISTORY: Past Medical History:  Diagnosis Date  . Cancer ([place])    Leukemia   . CLL (chronic  lymphocytic leukemia) ([place])    see records in care everywhere from [place]  . Coronary artery disease    minimal nonobstructive by 10/31/16 cath at [facility] in [place]  . Diabetes mellitus without complication ([place])   . GERD (gastroesophageal reflux disease)   . Hypertension     PAST SURGICAL HISTORY: Past Surgical History:  Procedure Laterality Date  . ANTERIOR CERVICAL DECOMP/DISCECTOMY FUSION N/A 04/20/2017   Procedure: ANTERIOR CERVICAL DECOMPRESSION FUSION, CERVICAL 3-4, CERVICAL 4-5 WITH INSTRUMENTATION AND ALLOGRAFT; REQUEST 3.5 HOURS;  Surgeon: Phylliss Bob, MD;  Location: [facility];  Service: Orthopedics;  Laterality: N/A;  ANTERIOR CERVICAL DECOMPRESSION FUSION, CERVICAL 3-4, CERVICAL 4-5 WITH INSTRUMENTATION AND ALLOGRAFT; REQUEST 3.5 HOURS  . BACK SURGERY    . COLONOSCOPY      FAMILY HISTORY: Family History  Problem Relation Age of Onset  . Heart attack Mother     SOCIAL HISTORY:  Social History   Socioeconomic History  . Marital status: Single    Spouse name: Not on file  . Number of children: 1  . Years of education: 47  . Highest education level: Not on file  Social Needs  . Financial resource strain: Not on file  . Food insecurity - worry: Not on file  . Food insecurity - inability: Not on file  . Transportation needs - medical: Not on file  . Transportation needs - non-medical: Not on file  Occupational History  . Occupation: Retired  Tobacco Use  . Smoking status: Never Smoker  . Smokeless tobacco: Never Used  Substance and Sexual Activity  . Alcohol use: Yes    Frequency: Never    Comment: Rare  . Drug use: No  . Sexual activity: Not on file  Other Topics Concern  . Not on file  Social History Narrative   Lives with wife   Caffeine use: Drinks coffee daily (2 cups)   Right handed      PHYSICAL EXAM   There were no vitals filed for this visit.  Not recorded      There is no height or weight on file to calculate BMI.  PHYSICAL  EXAMNIATION:  Gen: NAD, conversant, well nourised, obese, well groomed                     Cardiovascular: Regular rate rhythm, no peripheral edema, warm, nontender. Eyes: Conjunctivae clear without exudates or hemorrhage Neck: Supple, no carotid bruits. Pulmonary: Clear to auscultation bilaterally   NEUROLOGICAL EXAM:  MENTAL STATUS: Speech:    Speech is normal; fluent and spontaneous with normal comprehension.  Cognition:     Orientation to time, place and person     Normal recent and remote memory     Normal Attention span and concentration     Normal Language, naming, repeating,spontaneous speech     Fund of knowledge   CRANIAL NERVES: CN II: Visual fields are full to confrontation. Fundoscopic exam is normal with sharp discs and no vascular changes. Pupils are round equal and briskly reactive to light. CN III, IV, VI: extraocular movement are normal. No ptosis. CN V: Facial sensation is intact to pinprick in all 3 divisions bilaterally. Corneal responses are intact.  CN VII: Face is symmetric with normal eye closure and smile. CN  VIII: Hearing is normal to rubbing fingers CN IX, X: Palate elevates symmetrically. Phonation is normal. CN XI: Head turning and shoulder shrug are intact CN XII: Tongue is midline with normal movements and no atrophy.  MOTOR: He has bilateral ankle dorsiflexion weakness, right side is moderate to severe, left side is mild to moderate,  REFLEXES: Reflexes are hypoactive and symmetric at the biceps, triceps, knees, and absent at ankles. Plantar responses are flexor.  SENSORY: Intact to light touch, pinprick, positional sensation and vibratory sensation are intact in fingers and toes.  COORDINATION: Rapid alternating movements and fine finger movements are intact. There is no dysmetria on finger-to-nose and heel-knee-shin.    GAIT/STANCE: He needs pushed up to get up from seated position, dragging his right leg across the floor,  unsteady   DIAGNOSTIC DATA (LABS, IMAGING, TESTING) - I reviewed patient records, labs, notes, testing and imaging myself where available.   ASSESSMENT AND PLAN  Ronald Rice is a 72 y.o. male   Gait abnormality Radiating pain of the right lower back to right lower extremity, History of cervical spondylitic myelopathy status post decompression November 2018  His gait difficulty multifactorial, including chronic lumbar radiculopathy, evidence of persistent distal leg weakness,  I have suggested him continue with orthopedic evaluation, potentially pain management,    Marcial Pacas, M.D. Ph.D.  [facility] [address] [place], [place] [address] Ph: [phone] Fax: [phone]  TK:[id number], Thayer Jew, MD

## 2017-08-05 NOTE — Procedures (Addendum)
Full Name: Ronald Rice Gender: Male MRN #: [id number] Date of Birth: [birth year]    Visit Date: 08/05/2017 11:39 Age: 72 Years 39 Months Old Examining Physician: Marcial Pacas, MD  Referring Physician: Krista Blue, MD History: 72 year old male, has history of multiple lumbar decompression surgery, cervical decompression surgery, presenting with recurrent right-sided low back pain, radiating pain to right lower extremity, gait abnormality.  Summary of the tests:   Nerve conduction study: Bilateral sural, superficial peroneal sensory responses were absent.  Right median, ulnar sensory responses were absent.  Right radial sensory response showed significantly decreased to snap amplitude.  Bilateral tibial motor, peroneal to EDB responses were absent.  Right median, ulnar motor responses showed significantly decreased the C map amplitude, with moderate to severely prolonged distal latency, mild slow conduction velocity.  Electromyography: Selective needle examinations were performed at bilateral lower extremity muscles, and bilateral lumbosacral paraspinal muscles.  There is evidence of active neuropathic changes involving bilateral tibialis anterior, milder degree peroneal longus, medial gastrocnemius, vastus lateralis, right worse than left.  There was no spontaneous activity at the lumbar paraspinal muscles.    Conclusion:  This is an abnormal study.  There is electrodiagnostic evidence of chronic neuropathic changes involving bilateral L3-4 myotones, consistent with chronic bilateral lumbosacral radiculopathy, right worse than left.  In addition, there is evidence of length dependent mild axonal sensorimotor polyneuropathy; severe right carpal tunnel syndrome.     ------------------------------- Marcial Pacas, M.D.  [facility] [place], [place] [address] Tel: [phone] Fax: [phone]        [facility]    Nerve / Sites Muscle Latency Ref. Amplitude Ref.  Rel Amp Segments Distance Velocity Ref. Area    ms ms mV mV %  cm m/s m/s mVms  R Median - APB     Wrist APB 6.5 ?4.4 2.8 ?4.0 100 Wrist - APB 7   13.6     Upper arm APB 12.4  2.3  84.2 Upper arm - Wrist 23 38 ?49 13.5  R Ulnar - ADM     Wrist ADM 4.2 ?3.3 2.1 ?6.0 100 Wrist - ADM 7   9.7     B.Elbow ADM 9.9  1.6  72.6 B.Elbow - Wrist 22 38 ?49 14.8     A.Elbow ADM 12.7  1.8  118 A.Elbow - B.Elbow 10 37 ?49 19.0         A.Elbow - Wrist      R Peroneal - EDB     Ankle EDB NR ?6.5 NR ?2.0 NR Ankle - EDB 9   NR         Pop fossa - Ankle      L Peroneal - EDB     Ankle EDB NR ?6.5 NR ?2.0 NR Ankle - EDB 9   NR         Pop fossa - Ankle      R Tibial - AH     Ankle AH NR ?5.8 NR ?4.0 NR Ankle - AH 9   NR  L Tibial - AH     Ankle AH NR ?5.8 NR ?4.0 NR Ankle - AH 9   NR                 SNC    Nerve / Sites Rec. Site Peak Lat Ref.  Amp Ref. Segments Distance    ms ms V V  cm  R Radial - Anatomical snuff box (Forearm)  Forearm Wrist 3.8 ?2.9 3 ?15 Forearm - Wrist 10  R Sural - Ankle (Calf)     Calf Ankle NR ?4.4 NR ?6 Calf - Ankle 14  L Sural - Ankle (Calf)     Calf Ankle NR ?4.4 NR ?6 Calf - Ankle 14  R Superficial peroneal - Ankle     Lat leg Ankle NR ?4.4 NR ?6 Lat leg - Ankle 14  L Superficial peroneal - Ankle     Lat leg Ankle NR ?4.4 NR ?6 Lat leg - Ankle 14  R Median - Orthodromic (Dig II, Mid palm)     Dig II Wrist NR ?3.4 NR ?10 Dig II - Wrist 13  R Ulnar - Orthodromic, (Dig V, Mid palm)     Dig V Wrist NR ?3.1 NR ?5 Dig V - Wrist 49                   F  Wave    Nerve F Lat Ref.   ms ms  R Ulnar - ADM 38.4 ?32.0       EMG full       EMG Summary Table    Spontaneous MUAP Recruitment  Muscle IA Fib PSW Fasc Other Amp Dur. Poly Pattern  R. Tibialis anterior Increased 2+ None None CRDs Increased Increased 2+ Reduced  R. Tibialis posterior Increased 1+ None None _______ Increased Increased 1+ Reduced  R. Peroneus longus Increased None None None _______ Increased  Increased Normal Reduced  R. Gastrocnemius (Medial head) Increased None None None _______ Increased Normal Normal Reduced  R. Vastus lateralis Normal None None None _______ Increased Normal Normal Reduced  R. Biceps femoris (long head) Normal None None None _______ Increased Normal Normal Reduced  L. Tibialis anterior Increased 2+ None None _______ Increased Increased 1+ Reduced  L. Tibialis posterior Increased None None None _______ Increased Normal Normal Reduced  L. Gastrocnemius (Medial head) Increased None None None _______ Increased Increased Normal Reduced  L. Peroneus longus Increased 1+ None None _______ Increased Increased Normal Reduced  L. Vastus lateralis Increased None None None _______ Normal Decreased 1+ Reduced  L. Biceps femoris (long head) Increased None None None _______ Normal Increased 1+ Reduced  L. Lumbar paraspinals (mid) Increased None None None _______ Normal Normal Normal Normal  L. Lumbar paraspinals (low) Increased None None None _______ Normal Normal Normal Normal  R. Lumbar paraspinals (low) Increased None None None _______ Normal Normal Normal Normal  R. Lumbar paraspinals (mid) Increased None None None _______ Normal Normal Normal Normal

## 2017-09-07 ENCOUNTER — Encounter: Payer: Self-pay | Admitting: Hematology & Oncology

## 2017-09-09 ENCOUNTER — Other Ambulatory Visit (HOSPITAL_COMMUNITY): Payer: Self-pay | Admitting: Internal Medicine

## 2017-09-09 DIAGNOSIS — R1310 Dysphagia, unspecified: Secondary | ICD-10-CM

## 2017-09-14 ENCOUNTER — Ambulatory Visit (HOSPITAL_COMMUNITY)
Admission: RE | Admit: 2017-09-14 | Discharge: 2017-09-14 | Disposition: A | Discharge status: Home / Self Care | Payer: 59 | Source: Ambulatory Visit | Attending: Internal Medicine | Admitting: Internal Medicine

## 2017-09-14 DIAGNOSIS — R1313 Dysphagia, pharyngeal phase: Secondary | ICD-10-CM | POA: Diagnosis present

## 2017-09-14 DIAGNOSIS — R1310 Dysphagia, unspecified: Secondary | ICD-10-CM

## 2017-09-16 ENCOUNTER — Other Ambulatory Visit: Payer: Self-pay | Admitting: *Deleted

## 2017-09-16 DIAGNOSIS — C9110 Chronic lymphocytic leukemia of B-cell type not having achieved remission: Secondary | ICD-10-CM

## 2017-09-19 ENCOUNTER — Inpatient Hospital Stay: Payer: 59 | Admitting: Hematology & Oncology

## 2017-09-19 ENCOUNTER — Inpatient Hospital Stay: Payer: 59

## 2017-09-28 ENCOUNTER — Inpatient Hospital Stay (HOSPITAL_BASED_OUTPATIENT_CLINIC_OR_DEPARTMENT_OTHER): Payer: 59 | Admitting: Hematology & Oncology

## 2017-09-28 ENCOUNTER — Inpatient Hospital Stay: Payer: 59 | Attending: Hematology & Oncology

## 2017-09-28 ENCOUNTER — Encounter: Payer: Self-pay | Admitting: Hematology & Oncology

## 2017-09-28 ENCOUNTER — Other Ambulatory Visit: Payer: Self-pay

## 2017-09-28 VITALS — BP 130/85 | HR 82 | Temp 98.1°F | Resp 16 | Wt 289.0 lb

## 2017-09-28 DIAGNOSIS — Z79899 Other long term (current) drug therapy: Secondary | ICD-10-CM | POA: Insufficient documentation

## 2017-09-28 DIAGNOSIS — C9110 Chronic lymphocytic leukemia of B-cell type not having achieved remission: Secondary | ICD-10-CM | POA: Insufficient documentation

## 2017-09-28 DIAGNOSIS — C9190 Lymphoid leukemia, unspecified not having achieved remission: Secondary | ICD-10-CM | POA: Diagnosis not present

## 2017-09-28 LAB — CMP (CANCER CENTER ONLY)
ALT: 39 U/L (ref 10–47)
AST: 31 U/L (ref 11–38)
Albumin: 4 g/dL (ref 3.5–5.0)
Alkaline Phosphatase: 67 U/L (ref 26–84)
Anion gap: 11 (ref 5–15)
BUN: 13 mg/dL (ref 7–22)
CO2: 31 mmol/L (ref 18–33)
Calcium: 9.4 mg/dL (ref 8.0–10.3)
Chloride: 102 mmol/L (ref 98–108)
Creatinine: 1.3 mg/dL — ABNORMAL HIGH (ref 0.60–1.20)
Glucose, Bld: 178 mg/dL — ABNORMAL HIGH (ref 73–118)
Potassium: 3.4 mmol/L (ref 3.3–4.7)
Sodium: 144 mmol/L (ref 128–145)
Total Bilirubin: 1.1 mg/dL (ref 0.2–1.6)
Total Protein: 6.6 g/dL (ref 6.4–8.1)

## 2017-09-28 LAB — CBC WITH DIFFERENTIAL (CANCER CENTER ONLY)
Basophils Absolute: 0 10*3/uL (ref 0.0–0.1)
Basophils Relative: 0 %
Eosinophils Absolute: 0.1 10*3/uL (ref 0.0–0.5)
Eosinophils Relative: 2 %
HCT: 41.5 % (ref 38.7–49.9)
Hemoglobin: 15.1 g/dL (ref 13.0–17.1)
Lymphocytes Relative: 28 %
Lymphs Abs: 1.3 10*3/uL (ref 0.9–3.3)
MCH: 32.1 pg (ref 28.0–33.4)
MCHC: 36.4 g/dL — ABNORMAL HIGH (ref 32.0–35.9)
MCV: 87.6 fL (ref 82.0–98.0)
Monocytes Absolute: 0.6 10*3/uL (ref 0.1–0.9)
Monocytes Relative: 13 %
Neutro Abs: 2.5 10*3/uL (ref 1.5–6.5)
Neutrophils Relative %: 57 %
Platelet Count: 164 10*3/uL (ref 145–400)
RBC: 4.74 MIL/uL (ref 4.20–5.70)
RDW: 12.7 % (ref 11.1–15.7)
WBC Count: 4.5 10*3/uL (ref 4.0–10.0)

## 2017-09-28 LAB — SAVE SMEAR

## 2017-09-28 LAB — PLATELET BY CITRATE

## 2017-09-28 NOTE — Progress Notes (Signed)
Hematology and Oncology Follow Up Visit  Ronald Rice [id number] 1946-01-21 72 y.o. 09/28/2017   Principle Diagnosis:   CLL - Stage 0  Current Therapy:    Observation     Interim History:  Ronald Rice is back for second office visit.  We first saw him back in September 2018.  At that time, he was relatively asymptomatic.  He had just moved here from [place].  He was diagnosed with CLL back in 2015.  He received chemotherapy in [place] says that he.  He had been on Rituxan.  I do not think that we have to continue him on Rituxan.  The great news is that he is getting married next week.  He comes in with his fiance.  He has had no problem with infections.  He has had no nausea or vomiting.  He has had no rashes.  He has had no weight loss or weight gain.  He has had some urinary issues likely from an enlarged prostate.  Overall, I said that his performance status is ECOG 1.  Medications:  Current Outpatient Medications:  .  allopurinol (ZYLOPRIM) 300 MG tablet, Take 300 mg by mouth daily., Disp: , Rfl:  .  amLODipine (NORVASC) 5 MG tablet, Take 5 mg by mouth daily., Disp: , Rfl:  .  aspirin EC 81 MG tablet, Take 81 mg by mouth daily., Disp: , Rfl:  .  atenolol (TENORMIN) 25 MG tablet, Take 25 mg by mouth daily. , Disp: , Rfl:  .  atorvastatin (LIPITOR) 40 MG tablet, Take 40 mg by mouth daily., Disp: , Rfl:  .  Cinnamon 500 MG TABS, Take 500 mg by mouth daily., Disp: , Rfl:  .  gabapentin (NEURONTIN) 300 MG capsule, Take 1 capsule (300 mg total) by mouth at bedtime., Disp: 60 capsule, Rfl: 11 .  Ginsengs-Royal Jelly (GINSENG COMPLEX/ROYAL JELLY) 800-200 MG CAPS, Take 1 capsule by mouth daily., Disp: , Rfl:  .  lisinopril-hydrochlorothiazide (PRINZIDE,ZESTORETIC) 20-25 MG tablet, Take 2 tablets by mouth daily., Disp: , Rfl:  .  Multiple Vitamins-Minerals (CENTRUM SILVER 50+MEN PO), Take 1 tablet by mouth daily., Disp: , Rfl:  .  NON FORMULARY, Take 1 tablet by mouth daily. Blood  Sugar Defense , Disp: , Rfl:  .  potassium chloride (K-DUR) 10 MEQ tablet, Take 10 mEq by mouth., Disp: , Rfl:  .  Xylometazoline HCl (4-WAY NASAL SPRAY NA), Place 1 spray into both nostrils as needed (for allergies)., Disp: , Rfl:   Allergies:  Allergies  Allergen Reactions  . Other     Past Medical History, Surgical history, Social history, and Family History were reviewed and updated.  Review of Systems: Review of Systems  Constitutional: Negative.   HENT:  Negative.   Eyes: Negative.   Respiratory: Negative.   Cardiovascular: Negative.   Gastrointestinal: Negative.   Endocrine: Negative.   Genitourinary: Positive for nocturia.   Musculoskeletal: Positive for arthralgias, gait problem and myalgias.  Neurological: Positive for gait problem.  Hematological: Negative.   Psychiatric/Behavioral: Negative.     Physical Exam:  weight is 289 lb (131.1 kg). His oral temperature is 98.1 F (36.7 C). His blood pressure is 130/85 and his pulse is 82. His respiration is 16 and oxygen saturation is 97%.   Wt Readings from Last 3 Encounters:  09/28/17 289 lb (131.1 kg)  07/18/17 286 lb (129.7 kg)  04/18/17 273 lb 1.6 oz (123.9 kg)    Physical Exam  Constitutional: He is oriented to person, place, and  time.  HENT:  Head: Normocephalic and atraumatic.  Mouth/Throat: Oropharynx is clear and moist.  Eyes: Pupils are equal, round, and reactive to light. EOM are normal.  Neck: Normal range of motion.  Cardiovascular: Normal rate, regular rhythm and normal heart sounds.  Pulmonary/Chest: Effort normal and breath sounds normal.  Abdominal: Soft. Bowel sounds are normal.  Musculoskeletal: Normal range of motion. He exhibits no edema, tenderness or deformity.  Lymphadenopathy:    He has no cervical adenopathy.  Neurological: He is alert and oriented to person, place, and time.  Skin: Skin is warm and dry. No rash noted. No erythema.  Psychiatric: He has a normal mood and affect. His  behavior is normal. Judgment and thought content normal.  Vitals reviewed.    Lab Results  Component Value Date   WBC 4.5 09/28/2017   HGB 15.1 09/28/2017   HCT 41.5 09/28/2017   MCV 87.6 09/28/2017   PLT 164 09/28/2017     Chemistry      Component Value Date/Time   NA 144 09/28/2017 0755   NA 142 01/25/2017 1217   K 3.4 09/28/2017 0755   K 4.1 01/25/2017 1217   CL 102 09/28/2017 0755   CL 102 01/25/2017 1217   CO2 31 09/28/2017 0755   CO2 33 01/25/2017 1217   BUN 13 09/28/2017 0755   BUN 12 01/25/2017 1217   CREATININE 1.30 (H) 09/28/2017 0755   CREATININE 1.4 (H) 01/25/2017 1217      Component Value Date/Time   CALCIUM 9.4 09/28/2017 0755   CALCIUM 9.7 01/25/2017 1217   ALKPHOS 67 09/28/2017 0755   ALKPHOS 59 01/25/2017 1217   AST 31 09/28/2017 0755   ALT 39 09/28/2017 0755   ALT 32 01/25/2017 1217   BILITOT 1.1 09/28/2017 0755         Impression and Plan: Ronald Rice is a 72 year old African-American male.  He was diagnosed with CLL back in [place].  I looked at his blood smear.  I really do not see anything that looks like CLL.  His white cell differential is normal.  There is no adenopathy on his exam.  We will continue to follow him along.  I think we can get him back in 1 year at this point.   Volanda Napoleon, MD 5/8/20198:35 AM

## 2017-09-29 ENCOUNTER — Encounter: Payer: Self-pay | Admitting: Neurology

## 2017-09-29 LAB — BETA 2 MICROGLOBULIN, SERUM: Beta-2 Microglobulin: 2 mg/L (ref 0.6–2.4)

## 2017-10-03 ENCOUNTER — Telehealth: Payer: Self-pay | Admitting: Neurology

## 2017-10-03 ENCOUNTER — Institutional Professional Consult (permissible substitution): Payer: 59 | Admitting: Neurology

## 2017-10-03 ENCOUNTER — Encounter: Payer: Self-pay | Admitting: Neurology

## 2017-10-03 NOTE — Telephone Encounter (Signed)
Pt called the sleep lab earlier today and states that he cant make it to the apt today at 1. We informed him that would be considered a no show. Pt was transferred to billing because he wanted the no show fee waived. I am not aware of the outcome of that phone call but the Pt did not come to apt today.

## 2017-10-13 ENCOUNTER — Inpatient Hospital Stay: Payer: 59

## 2017-10-13 ENCOUNTER — Ambulatory Visit: Payer: 59 | Admitting: Hematology & Oncology

## 2017-11-22 ENCOUNTER — Emergency Department (HOSPITAL_COMMUNITY): Payer: Medicare Other

## 2017-11-22 ENCOUNTER — Inpatient Hospital Stay (HOSPITAL_COMMUNITY)
Admission: EM | Admit: 2017-11-22 | Discharge: 2017-11-26 | DRG: 698 | Disposition: A | Discharge status: Home Health | Payer: Medicare Other | Attending: Internal Medicine | Admitting: Internal Medicine

## 2017-11-22 ENCOUNTER — Inpatient Hospital Stay (HOSPITAL_COMMUNITY): Payer: Medicare Other

## 2017-11-22 ENCOUNTER — Encounter (HOSPITAL_COMMUNITY): Payer: Self-pay | Admitting: Emergency Medicine

## 2017-11-22 ENCOUNTER — Other Ambulatory Visit: Payer: Self-pay

## 2017-11-22 DIAGNOSIS — C9110 Chronic lymphocytic leukemia of B-cell type not having achieved remission: Secondary | ICD-10-CM | POA: Diagnosis present

## 2017-11-22 DIAGNOSIS — N3001 Acute cystitis with hematuria: Secondary | ICD-10-CM | POA: Diagnosis not present

## 2017-11-22 DIAGNOSIS — F459 Somatoform disorder, unspecified: Secondary | ICD-10-CM | POA: Diagnosis not present

## 2017-11-22 DIAGNOSIS — G9341 Metabolic encephalopathy: Secondary | ICD-10-CM | POA: Diagnosis present

## 2017-11-22 DIAGNOSIS — Z981 Arthrodesis status: Secondary | ICD-10-CM | POA: Diagnosis not present

## 2017-11-22 DIAGNOSIS — R1111 Vomiting without nausea: Secondary | ICD-10-CM | POA: Diagnosis not present

## 2017-11-22 DIAGNOSIS — E1140 Type 2 diabetes mellitus with diabetic neuropathy, unspecified: Secondary | ICD-10-CM | POA: Diagnosis present

## 2017-11-22 DIAGNOSIS — Z8249 Family history of ischemic heart disease and other diseases of the circulatory system: Secondary | ICD-10-CM

## 2017-11-22 DIAGNOSIS — A419 Sepsis, unspecified organism: Secondary | ICD-10-CM

## 2017-11-22 DIAGNOSIS — N281 Cyst of kidney, acquired: Secondary | ICD-10-CM | POA: Diagnosis present

## 2017-11-22 DIAGNOSIS — R42 Dizziness and giddiness: Secondary | ICD-10-CM | POA: Diagnosis not present

## 2017-11-22 DIAGNOSIS — R6520 Severe sepsis without septic shock: Secondary | ICD-10-CM | POA: Diagnosis not present

## 2017-11-22 DIAGNOSIS — R509 Fever, unspecified: Secondary | ICD-10-CM

## 2017-11-22 DIAGNOSIS — G9389 Other specified disorders of brain: Secondary | ICD-10-CM | POA: Diagnosis not present

## 2017-11-22 DIAGNOSIS — N401 Enlarged prostate with lower urinary tract symptoms: Secondary | ICD-10-CM | POA: Diagnosis present

## 2017-11-22 DIAGNOSIS — N138 Other obstructive and reflux uropathy: Secondary | ICD-10-CM | POA: Diagnosis not present

## 2017-11-22 DIAGNOSIS — E669 Obesity, unspecified: Secondary | ICD-10-CM | POA: Diagnosis not present

## 2017-11-22 DIAGNOSIS — T83511A Infection and inflammatory reaction due to indwelling urethral catheter, initial encounter: Principal | ICD-10-CM | POA: Diagnosis present

## 2017-11-22 DIAGNOSIS — E876 Hypokalemia: Secondary | ICD-10-CM | POA: Diagnosis present

## 2017-11-22 DIAGNOSIS — I10 Essential (primary) hypertension: Secondary | ICD-10-CM | POA: Diagnosis present

## 2017-11-22 DIAGNOSIS — R000 Tachycardia, unspecified: Secondary | ICD-10-CM | POA: Diagnosis not present

## 2017-11-22 DIAGNOSIS — Z7289 Other problems related to lifestyle: Secondary | ICD-10-CM | POA: Diagnosis not present

## 2017-11-22 DIAGNOSIS — C9111 Chronic lymphocytic leukemia of B-cell type in remission: Secondary | ICD-10-CM | POA: Diagnosis not present

## 2017-11-22 DIAGNOSIS — A4152 Sepsis due to Pseudomonas: Secondary | ICD-10-CM | POA: Diagnosis present

## 2017-11-22 DIAGNOSIS — R270 Ataxia, unspecified: Secondary | ICD-10-CM

## 2017-11-22 DIAGNOSIS — R29818 Other symptoms and signs involving the nervous system: Secondary | ICD-10-CM | POA: Diagnosis not present

## 2017-11-22 DIAGNOSIS — K219 Gastro-esophageal reflux disease without esophagitis: Secondary | ICD-10-CM | POA: Diagnosis present

## 2017-11-22 DIAGNOSIS — D72829 Elevated white blood cell count, unspecified: Secondary | ICD-10-CM | POA: Diagnosis not present

## 2017-11-22 DIAGNOSIS — Y731 Therapeutic (nonsurgical) and rehabilitative gastroenterology and urology devices associated with adverse incidents: Secondary | ICD-10-CM | POA: Diagnosis not present

## 2017-11-22 DIAGNOSIS — I639 Cerebral infarction, unspecified: Secondary | ICD-10-CM | POA: Diagnosis not present

## 2017-11-22 DIAGNOSIS — Z7982 Long term (current) use of aspirin: Secondary | ICD-10-CM

## 2017-11-22 DIAGNOSIS — E785 Hyperlipidemia, unspecified: Secondary | ICD-10-CM | POA: Diagnosis present

## 2017-11-22 DIAGNOSIS — E1159 Type 2 diabetes mellitus with other circulatory complications: Secondary | ICD-10-CM | POA: Diagnosis not present

## 2017-11-22 DIAGNOSIS — Z6835 Body mass index (BMI) 35.0-35.9, adult: Secondary | ICD-10-CM

## 2017-11-22 DIAGNOSIS — R531 Weakness: Secondary | ICD-10-CM | POA: Diagnosis not present

## 2017-11-22 DIAGNOSIS — N39498 Other specified urinary incontinence: Secondary | ICD-10-CM | POA: Diagnosis present

## 2017-11-22 DIAGNOSIS — N10 Acute tubulo-interstitial nephritis: Secondary | ICD-10-CM | POA: Diagnosis not present

## 2017-11-22 DIAGNOSIS — C9190 Lymphoid leukemia, unspecified not having achieved remission: Secondary | ICD-10-CM | POA: Diagnosis not present

## 2017-11-22 DIAGNOSIS — G459 Transient cerebral ischemic attack, unspecified: Secondary | ICD-10-CM | POA: Diagnosis not present

## 2017-11-22 DIAGNOSIS — R4781 Slurred speech: Secondary | ICD-10-CM

## 2017-11-22 DIAGNOSIS — I2510 Atherosclerotic heart disease of native coronary artery without angina pectoris: Secondary | ICD-10-CM | POA: Diagnosis present

## 2017-11-22 DIAGNOSIS — R61 Generalized hyperhidrosis: Secondary | ICD-10-CM | POA: Diagnosis not present

## 2017-11-22 DIAGNOSIS — R110 Nausea: Secondary | ICD-10-CM | POA: Diagnosis not present

## 2017-11-22 DIAGNOSIS — R410 Disorientation, unspecified: Secondary | ICD-10-CM | POA: Diagnosis not present

## 2017-11-22 LAB — I-STAT CHEM 8, ED
BUN: 13 mg/dL (ref 8–23)
Calcium, Ion: 1.12 mmol/L — ABNORMAL LOW (ref 1.15–1.40)
Chloride: 101 mmol/L (ref 98–111)
Creatinine, Ser: 1.2 mg/dL (ref 0.61–1.24)
Glucose, Bld: 180 mg/dL — ABNORMAL HIGH (ref 70–99)
HCT: 43 % (ref 39.0–52.0)
Hemoglobin: 14.6 g/dL (ref 13.0–17.0)
Potassium: 3.2 mmol/L — ABNORMAL LOW (ref 3.5–5.1)
Sodium: 140 mmol/L (ref 135–145)
TCO2: 22 mmol/L (ref 22–32)

## 2017-11-22 LAB — RAPID URINE DRUG SCREEN, HOSP PERFORMED
Amphetamines: NOT DETECTED
Benzodiazepines: NOT DETECTED
Cocaine: NOT DETECTED
Opiates: NOT DETECTED
Tetrahydrocannabinol: NOT DETECTED

## 2017-11-22 LAB — URINALYSIS, ROUTINE W REFLEX MICROSCOPIC
Bacteria, UA: NONE SEEN
Bilirubin Urine: NEGATIVE
Glucose, UA: NEGATIVE mg/dL
Hgb urine dipstick: NEGATIVE
Ketones, ur: 20 mg/dL — AB
Nitrite: NEGATIVE
Protein, ur: NEGATIVE mg/dL
Specific Gravity, Urine: 1.017 (ref 1.005–1.030)
pH: 6 (ref 5.0–8.0)

## 2017-11-22 LAB — DIFFERENTIAL
Abs Immature Granulocytes: 0.1 10*3/uL (ref 0.0–0.1)
Basophils Absolute: 0 10*3/uL (ref 0.0–0.1)
Basophils Relative: 0 %
Eosinophils Absolute: 0 10*3/uL (ref 0.0–0.7)
Eosinophils Relative: 0 %
Immature Granulocytes: 1 %
Lymphocytes Relative: 12 %
Lymphs Abs: 1.3 10*3/uL (ref 0.7–4.0)
Monocytes Absolute: 0.9 10*3/uL (ref 0.1–1.0)
Monocytes Relative: 8 %
Neutro Abs: 9.1 10*3/uL — ABNORMAL HIGH (ref 1.7–7.7)
Neutrophils Relative %: 79 %

## 2017-11-22 LAB — CBC
HCT: 41.9 % (ref 39.0–52.0)
Hemoglobin: 14.9 g/dL (ref 13.0–17.0)
MCH: 31 pg (ref 26.0–34.0)
MCHC: 35.6 g/dL (ref 30.0–36.0)
MCV: 87.3 fL (ref 78.0–100.0)
Platelets: 190 10*3/uL (ref 150–400)
RBC: 4.8 MIL/uL (ref 4.22–5.81)
RDW: 12.1 % (ref 11.5–15.5)
WBC: 11.4 10*3/uL — ABNORMAL HIGH (ref 4.0–10.5)

## 2017-11-22 LAB — COMPREHENSIVE METABOLIC PANEL
ALT: 23 U/L (ref 0–44)
AST: 26 U/L (ref 15–41)
Albumin: 4.1 g/dL (ref 3.5–5.0)
Alkaline Phosphatase: 59 U/L (ref 38–126)
Anion gap: 14 (ref 5–15)
BUN: 12 mg/dL (ref 8–23)
CO2: 24 mmol/L (ref 22–32)
Calcium: 9.5 mg/dL (ref 8.9–10.3)
Chloride: 103 mmol/L (ref 98–111)
Creatinine, Ser: 1.31 mg/dL — ABNORMAL HIGH (ref 0.61–1.24)
GFR calc Af Amer: >60 mL/min (ref >60)
GFR calc non Af Amer: 54 mL/min — ABNORMAL LOW (ref >60)
Glucose, Bld: 184 mg/dL — ABNORMAL HIGH (ref 70–99)
Potassium: 3.3 mmol/L — ABNORMAL LOW (ref 3.5–5.1)
Sodium: 141 mmol/L (ref 135–145)
Total Bilirubin: 1.1 mg/dL (ref 0.3–1.2)
Total Protein: 7.2 g/dL (ref 6.5–8.1)

## 2017-11-22 LAB — I-STAT TROPONIN, ED: Troponin i, poc: 0 ng/mL (ref 0.00–0.08)

## 2017-11-22 LAB — PROTIME-INR
INR: 1.04
Prothrombin Time: 13.5 seconds (ref 11.4–15.2)

## 2017-11-22 LAB — GLUCOSE, CAPILLARY: Glucose-Capillary: 170 mg/dL — ABNORMAL HIGH (ref 70–99)

## 2017-11-22 LAB — APTT: aPTT: 25 seconds (ref 24–36)

## 2017-11-22 MED ORDER — ONDANSETRON HCL 4 MG/2ML IJ SOLN
4.0000 mg | Freq: Four times a day (QID) | INTRAMUSCULAR | Status: DC | PRN
  Administered 2017-11-22: 4 mg via INTRAVENOUS

## 2017-11-22 MED ORDER — ALTEPLASE (STROKE) FULL DOSE INFUSION
90.0000 mg | Freq: Once | INTRAVENOUS | Status: AC
  Administered 2017-11-22: 90 mg via INTRAVENOUS
  Filled 2017-11-22: qty 100

## 2017-11-22 MED ORDER — STROKE: EARLY STAGES OF RECOVERY BOOK
Freq: Once | Status: AC
  Administered 2017-11-22: 23:00:00
  Filled 2017-11-22 (×2): qty 1

## 2017-11-22 MED ORDER — PANTOPRAZOLE SODIUM 40 MG PO TBEC
40.0000 mg | DELAYED_RELEASE_TABLET | Freq: Every day | ORAL | Status: DC
  Administered 2017-11-23 – 2017-11-26 (×4): 40 mg via ORAL
  Filled 2017-11-22 (×4): qty 1

## 2017-11-22 MED ORDER — ACETAMINOPHEN 650 MG RE SUPP
650.0000 mg | RECTAL | Status: DC | PRN
  Administered 2017-11-24: 650 mg via RECTAL
  Filled 2017-11-22: qty 1

## 2017-11-22 MED ORDER — IOPAMIDOL (ISOVUE-370) INJECTION 76%
50.0000 mL | Freq: Once | INTRAVENOUS | Status: AC | PRN
  Administered 2017-11-22: 50 mL via INTRAVENOUS

## 2017-11-22 MED ORDER — SODIUM CHLORIDE 0.9 % IV SOLN
INTRAVENOUS | Status: DC
  Administered 2017-11-22: 23:00:00 via INTRAVENOUS

## 2017-11-22 MED ORDER — ONDANSETRON HCL 4 MG/2ML IJ SOLN
INTRAMUSCULAR | Status: AC
  Filled 2017-11-22: qty 2

## 2017-11-22 MED ORDER — ACETAMINOPHEN 325 MG PO TABS
650.0000 mg | ORAL_TABLET | ORAL | Status: DC | PRN
  Administered 2017-11-23 – 2017-11-26 (×6): 650 mg via ORAL
  Filled 2017-11-22 (×6): qty 2

## 2017-11-22 MED ORDER — ACETAMINOPHEN 160 MG/5ML PO SOLN: 650.0000 mg | ORAL | Status: DC | PRN

## 2017-11-22 MED ORDER — SENNOSIDES-DOCUSATE SODIUM 8.6-50 MG PO TABS
1.0000 | ORAL_TABLET | Freq: Every evening | ORAL | Status: DC | PRN
  Administered 2017-11-25: 1 via ORAL
  Filled 2017-11-22 (×2): qty 1

## 2017-11-22 NOTE — ED Notes (Signed)
CT called

## 2017-11-22 NOTE — ED Notes (Signed)
[facility] contacted to activate Code Stroke 

## 2017-11-22 NOTE — ED Notes (Signed)
Delay in TPA due to neuro consulting family.

## 2017-11-22 NOTE — ED Notes (Signed)
Pt back in room from MRI.

## 2017-11-22 NOTE — ED Notes (Addendum)
Pt in MRI. MRI started.

## 2017-11-22 NOTE — ED Notes (Signed)
MRI completed. Neurologist at bedside in MRI.

## 2017-11-22 NOTE — ED Notes (Signed)
Unable to obtain 2nd IV access. Neurologist aware at bedside. Order pharmacist to mix TPA and start prior to CT angio.

## 2017-11-22 NOTE — ED Notes (Signed)
Gold top, light green top, dark green top, blue top and lavender top collected upon arrival/triage; dark green top in mini lab on rocker. The rest are sent down to main lab.

## 2017-11-22 NOTE — ED Notes (Signed)
ED PA at bedside assessing patient. Family at bedside.

## 2017-11-22 NOTE — Progress Notes (Signed)
Pharmacist Code Stroke Response  Notified to mix tPA at 1930 by Dr. Lorraine Lax Delivered tPA to RN at 1933  tPA initiated at [place]  Issues/delays encountered (if applicable): n/a  Reginia Naas 11/22/17 7:42 PM

## 2017-11-22 NOTE — ED Notes (Signed)
MRI contacted and ready for patient. Patient transported by RN and EMT to MRI.

## 2017-11-22 NOTE — ED Notes (Signed)
Neurologist at bedside. 

## 2017-11-22 NOTE — ED Notes (Addendum)
This nurse took over care at this time. Pt on stretcher in MRI hallway waiting to get MRI started. Pt a&o x4. TPA running currently.

## 2017-11-22 NOTE — ED Triage Notes (Signed)
Per [facility] pt coming from home. Onset of 3pm today pt was driving with wife and began driving erratically and having difficulty speaking. Fire was on scene and reports pt was vomiting and diaphoretic. Denies any chest pain or shortness of breathe. Reports feeling weak with standing. 4mg  Zofran given en route with no relief.

## 2017-11-22 NOTE — H&P (Signed)
Chief Complaint: Gait difficulty, confusion  History obtained from: Patient and Chart     HPI:                                                                                                                                       Ronald Rice is an 72 y.o. male past medical history significant for diabetes mellitus,obesity, coronary artery disease, hypertension, CLL in remission , cervical myelopathy status post decompression and spinal stimulator, neuropathy and known weakness of his legs requiring a cane/walker for ambulation presents to the emergency room for sudden onset confusion and gait difficulty that began around 3 PM.  Wife states that she and the patient were at the office at [facility].  He seemed to be fine and got into the car around 3:15 PM.  She went back to the car at 3:30 PM and when he started driving she noticed that he was driving erratically.  He was also on the phone with his brother and the brother felt that he was behaving a little abnormal and not speaking correctly.  On reaching home, the patient was unable to get from the car seat.  After a long time the patient got up and with his cane and barely managed to make it to the garage.  The wife noticed that he leaned against the garage door for support.  Patient then started vomiting.  She called her daughter who who then called EMS.  Patient was not stroke alerted on arrival.  At triage, at 7 PM stroke alert was called. On assessment the patient did not have any significant motor weakness.  However there was predominant truncal ataxia and patient was able unable to sit up without support to stand up despite three-person support.   ER course Despite the patient's low NIH score, because of a high concern of a posterior circulation stroke given fairly patient's sudden onset presentation -risks versus benefits of TPA were discussed.  CT head was negative for any acute findings.  Patient received TPA with mild delay after decision  to obtain IV line.  Also a mild delay due to confirmation of history with wife regarding patient's baseline and presentation as the patient appeared to be a poor historian.  Patient also recently underwent a Foley catheterization for BPH which led to a traumatic tap and mild hematuria ( blood stained urine).  Both the patient and wife denied gross hematuria.  Due to patient's complicated presentation, stat MRI was obtained which revealed patient had no acute stroke.  TPA was already completed on the time of MRI read. Patient admitted for observation.       Date last known well:  Time last known well: 3pm tPA Given: yes NIHSS: 2 Baseline MRS 3    Past Medical History:  Diagnosis Date  . Cancer ([place])    Leukemia   . CLL (chronic lymphocytic leukemia) ([place])  see records in care everywhere from [place]  . Coronary artery disease    minimal nonobstructive by 10/31/16 cath at [facility] in [place]  . Diabetes mellitus without complication ([place])   . GERD (gastroesophageal reflux disease)   . Hypertension     Past Surgical History:  Procedure Laterality Date  . ANTERIOR CERVICAL DECOMP/DISCECTOMY FUSION N/A 04/20/2017   Procedure: ANTERIOR CERVICAL DECOMPRESSION FUSION, CERVICAL 3-4, CERVICAL 4-5 WITH INSTRUMENTATION AND ALLOGRAFT; REQUEST 3.5 HOURS;  Surgeon: Phylliss Bob, MD;  Location: [place];  Service: Orthopedics;  Laterality: N/A;  ANTERIOR CERVICAL DECOMPRESSION FUSION, CERVICAL 3-4, CERVICAL 4-5 WITH INSTRUMENTATION AND ALLOGRAFT; REQUEST 3.5 HOURS  . BACK SURGERY    . COLONOSCOPY      Family History  Problem Relation Age of Onset  . Heart attack Mother    Social History:  reports that he has never smoked. He has never used smokeless tobacco. He reports that he drinks alcohol. He reports that he does not use drugs.  Allergies: No Known Allergies  Medications:                                                                                                                         I reviewed home medications   ROS:                                                                                                                                     14 systems reviewed and negative except above    Examination:                                                                                                      General: Appears well-developed and well-nourished.  Psych: Affect appropriate to situation Eyes: No scleral injection HENT: No OP obstrucion Head: Normocephalic.  Cardiovascular: Normal rate and regular rhythm.  Respiratory: Effort normal and breath sounds normal to anterior ascultation GI: Soft.  No distension. There is no tenderness.  Skin: WDI  Neurological Examination Mental Status: Alert, oriented, thought content appropriate.  Speech fluent without evidence of aphasia. Able to follow 3 step commands without difficulty. Cranial Nerves: II: Visual fields grossly normal,  III,IV, VI: ptosis not present, extra-ocular motions intact bilaterally, pupils equal, round, reactive to light and accommodation V,VII: smile symmetric, facial light touch sensation normal bilaterally VIII: hearing normal bilaterally IX,X: uvula rises symmetrically XI: bilateral shoulder shrug XII: midline tongue extension Motor: Right : Upper extremity   5/5    Left:     Upper extremity   5/5  Lower extremity   4/5     Lower extremity   4/5 Tone and bulk:normal tone throughout; no atrophy noted Sensory: Pinprick and light touch intact throughout, bilaterally Deep Tendon Reflexes: 1+ and symmetric throughout Plantars: Right: downgoing   Left: downgoing Cerebellar: normal finger-to-nose, abnormal heel-to-shin test on both legs Gait: normal gait and station     Lab Results: Basic Metabolic Panel: Recent Labs  Lab 11/22/17 1844 11/22/17 1858  NA 141 140  K 3.3* 3.2*  CL 103 101  CO2 24  --   GLUCOSE 184* 180*  BUN 12 13  CREATININE 1.31* 1.20  CALCIUM 9.5  --      CBC: Recent Labs  Lab 11/22/17 1844 11/22/17 1858  WBC 11.4*  --   NEUTROABS 9.1*  --   HGB 14.9 14.6  HCT 41.9 43.0  MCV 87.3  --   PLT 190  --     Coagulation Studies: Recent Labs    11/22/17 1844  LABPROT 13.5  INR 1.04    Imaging: Ct Angio Head W Or Wo Contrast  Result Date: 11/22/2017 CLINICAL DATA:  Initial evaluation for acute dizziness, confusion. EXAM: CT ANGIOGRAPHY HEAD AND NECK TECHNIQUE: Multidetector CT imaging of the head and neck was performed using the standard protocol during bolus administration of intravenous contrast. Multiplanar CT image reconstructions and MIPs were obtained to evaluate the vascular anatomy. Carotid stenosis measurements (when applicable) are obtained utilizing NASCET criteria, using the distal internal carotid diameter as the denominator. CONTRAST:  68mL ISOVUE-370 IOPAMIDOL (ISOVUE-370) INJECTION 76% COMPARISON:  Prior CT and MRI from earlier the same day. FINDINGS: CTA NECK FINDINGS Aortic arch: Visualized aortic arch of normal caliber with normal 3 vessel morphology. No flow-limiting stenosis about the origin of the great vessels. Visualized subclavian arteries widely patent. Right carotid system: Right common and internal carotid arteries are widely patent without stenosis, dissection, or occlusion. No atheromatous narrowing about the right carotid bifurcation. Left carotid system: Left common and internal carotid arteries widely patent without stenosis, dissection, or occlusion. No atheromatous narrowing about the left carotid bifurcation. Vertebral arteries: Both of the vertebral arteries arise from the subclavian arteries. Vertebral arteries patent within the neck without stenosis, dissection, or occlusion. Evaluation the vertebral arteries mildly limited at the C3 through C5 levels due to adjacent ACDF. Skeleton: No acute osseous abnormality. No worrisome lytic or blastic osseous lesions. Patient is status post prior cervical fusion at C3  through C7. Other neck: No acute soft tissue abnormality within the neck. 17 mm hypodense right thyroid nodule, indeterminate. Upper chest: Visualized upper chest demonstrates no acute finding. Scattered atelectatic changes noted dependently within the visualized lungs. Visualized lungs are otherwise clear. Review of the MIP images confirms the above findings CTA HEAD FINDINGS Anterior circulation: Internal carotid arteries patent to the termini without stenosis. Minimal plaque within the cavernous ICAs bilaterally. A1 segments patent bilaterally. Normal anterior communicating artery. Azygos ACA, patent to its distal  aspect. M1 segments patent without stenosis or occlusion. No proximal M2 occlusion. Distal MCA branches well perfused and symmetric. Posterior circulation: Vertebral arteries patent to the vertebrobasilar junction without stenosis. Left vertebral artery slightly dominant. Patent left PICA. Right PICA not well visualized. Basilar widely patent to its distal aspect. Superior cerebellar and posterior cerebral arteries widely patent bilaterally. Venous sinuses: Patent. Anatomic variants: Azygos ACA.  No intracranial aneurysm. Delayed phase: No abnormal enhancement. Review of the MIP images confirms the above findings IMPRESSION: 1. Negative CTA with no evidence for large vessel occlusion. No hemodynamically significant or correctable stenosis identified. 2. Azygos ACA. 3. 1.7 cm right thyroid nodule, indeterminate. Follow-up examination with dedicated thyroid ultrasound recommended for further characterization. Electronically Signed   By: Jeannine Boga M.D.   On: 11/22/2017 22:14   Ct Angio Neck W And/or Wo Contrast  Result Date: 11/22/2017 CLINICAL DATA:  Initial evaluation for acute dizziness, confusion. EXAM: CT ANGIOGRAPHY HEAD AND NECK TECHNIQUE: Multidetector CT imaging of the head and neck was performed using the standard protocol during bolus administration of intravenous contrast.  Multiplanar CT image reconstructions and MIPs were obtained to evaluate the vascular anatomy. Carotid stenosis measurements (when applicable) are obtained utilizing NASCET criteria, using the distal internal carotid diameter as the denominator. CONTRAST:  53mL ISOVUE-370 IOPAMIDOL (ISOVUE-370) INJECTION 76% COMPARISON:  Prior CT and MRI from earlier the same day. FINDINGS: CTA NECK FINDINGS Aortic arch: Visualized aortic arch of normal caliber with normal 3 vessel morphology. No flow-limiting stenosis about the origin of the great vessels. Visualized subclavian arteries widely patent. Right carotid system: Right common and internal carotid arteries are widely patent without stenosis, dissection, or occlusion. No atheromatous narrowing about the right carotid bifurcation. Left carotid system: Left common and internal carotid arteries widely patent without stenosis, dissection, or occlusion. No atheromatous narrowing about the left carotid bifurcation. Vertebral arteries: Both of the vertebral arteries arise from the subclavian arteries. Vertebral arteries patent within the neck without stenosis, dissection, or occlusion. Evaluation the vertebral arteries mildly limited at the C3 through C5 levels due to adjacent ACDF. Skeleton: No acute osseous abnormality. No worrisome lytic or blastic osseous lesions. Patient is status post prior cervical fusion at C3 through C7. Other neck: No acute soft tissue abnormality within the neck. 17 mm hypodense right thyroid nodule, indeterminate. Upper chest: Visualized upper chest demonstrates no acute finding. Scattered atelectatic changes noted dependently within the visualized lungs. Visualized lungs are otherwise clear. Review of the MIP images confirms the above findings CTA HEAD FINDINGS Anterior circulation: Internal carotid arteries patent to the termini without stenosis. Minimal plaque within the cavernous ICAs bilaterally. A1 segments patent bilaterally. Normal anterior  communicating artery. Azygos ACA, patent to its distal aspect. M1 segments patent without stenosis or occlusion. No proximal M2 occlusion. Distal MCA branches well perfused and symmetric. Posterior circulation: Vertebral arteries patent to the vertebrobasilar junction without stenosis. Left vertebral artery slightly dominant. Patent left PICA. Right PICA not well visualized. Basilar widely patent to its distal aspect. Superior cerebellar and posterior cerebral arteries widely patent bilaterally. Venous sinuses: Patent. Anatomic variants: Azygos ACA.  No intracranial aneurysm. Delayed phase: No abnormal enhancement. Review of the MIP images confirms the above findings IMPRESSION: 1. Negative CTA with no evidence for large vessel occlusion. No hemodynamically significant or correctable stenosis identified. 2. Azygos ACA. 3. 1.7 cm right thyroid nodule, indeterminate. Follow-up examination with dedicated thyroid ultrasound recommended for further characterization. Electronically Signed   By: Jeannine Boga M.D.   On: 11/22/2017  22:14   Mr Brain Wo Contrast  Result Date: 11/22/2017 CLINICAL DATA:  Ataxia.  Difficulty speaking.  Given tPA. EXAM: MRI HEAD WITHOUT CONTRAST TECHNIQUE: Multiplanar, multiecho pulse sequences of the brain and surrounding structures were obtained without intravenous contrast. COMPARISON:  Head CT 11/22/2017 FINDINGS: An abbreviated protocol was performed at the direction of the attending stroke neurologist. Axial and coronal diffusion, sagittal T1, and axial FLAIR sequences were obtained Brain: There is no evidence of acute infarct, intracranial hemorrhage, mass, midline shift, or extra-axial fluid collection. Mild generalized cerebral atrophy is within normal limits for age. No significant cerebral white matter disease is seen. Vascular: Limited assessment on this abbreviated MRI. Skull and upper cervical spine: No suspicious marrow lesion. Prior anterior cervical fusion.  Sinuses/Orbits: No acute findings. Other: None. IMPRESSION: Abbreviated brain MRI without evidence of acute infarct or other significant intracranial abnormality. Electronically Signed   By: Logan Bores M.D.   On: 11/22/2017 20:47   Ct Head Code Stroke Wo Contrast  Result Date: 11/22/2017 CLINICAL DATA:  Code stroke. Dizziness, confusion, nausea, and vomiting. EXAM: CT HEAD WITHOUT CONTRAST TECHNIQUE: Contiguous axial images were obtained from the base of the skull through the vertex without intravenous contrast. COMPARISON:  05/22/2016 FINDINGS: Brain: There is no evidence of acute infarct, intracranial hemorrhage, mass, midline shift, or extra-axial fluid collection. Mild generalized cerebral atrophy is not greater than expected for age. Vascular: Calcified atherosclerosis at the skull base. No hyperdense vessel. Skull: No fracture or focal osseous lesion. Sinuses/Orbits: Visualized paranasal sinuses and mastoid air cells are clear. Orbits are unremarkable. Other: Numerous dermal calcifications at the vertex. ASPECTS [facility] Stroke Program Early CT Score) Not scored with this history. IMPRESSION: No evidence of acute intracranial abnormality. These results were communicated to Dr. Lorraine Lax at 7:15 pm on 11/22/2017 by text page via the [facility] messaging system. Electronically Signed   By: Logan Bores M.D.   On: 11/22/2017 19:15     ASSESSMENT AND PLAN  72 year old male with significant vascular risk factors, CLL in remission, urinary incontinence status post recent Foley catheterization, chronic gait difficulty due to cervical myelopathy status post decompression presents to the emergency room with sudden onset nausea vomiting, incoordination and difficulty with gait.  Patient states he also feels generalized weak.  History and exam was concerning for a posterior circulation infarct particularly cerebellar vermis area.  Exam was confounded by prior gait abnormality.  Since patient was stroke alerted right at  the cusp of 4.5 hr mark, decision was made to give TPA after explaining risks versus benefits including risk from bleeding at the site of truamatic Foley catheterization.  However stat MRI showed no evidence of any acute infarct.  This raises possibility of an aborted stroke versus stroke mimic.     I do not have a great explanation for patient's constellation of symptoms as well as abrupt onset of symptoms. Patient  was not hypoglycemic, did not take any specific sedating medications medications prior to this episode.  Not complain of any recent fever, diarrhea to suspect a GI viral illness.  Potassium was mildly low 3.3 but otherwise electrolytes were within normal limits   Generalized weakness, gait imbalance ? Aborted stroke vs GI illness   Plan PT/OT IV fluids Repeat CT head in 24 hrs after tPA   HTN: can resume home meds, goal normotension DM: SS insulin, AIC CAD: hold Antiplatelet 24hrs Chronic back pain: can resume home meds Urinary incontinence: continue flomax    This patient is neurologically critically ill  due to possible stroke receiving IV tPA.  He is at risk for significant risk of neurological worsening from cerebral edema,  death from brain herniation, heart failure, hemorrhagic conversion, infection, respiratory failure and seizure. This patient's care requires constant monitoring of vital signs, hemodynamics, respiratory and cardiac monitoring, review of multiple databases, neurological assessment, discussion with family, other specialists and medical decision making of high complexity.  I spent 75  minutes of neurocritical time in the care of this patient.      Aroura Vasudevan [facility] Pager Number [phone]

## 2017-11-22 NOTE — ED Notes (Signed)
Patient arrived in CT. 

## 2017-11-22 NOTE — ED Notes (Signed)
Pt transferred to CT.

## 2017-11-22 NOTE — Progress Notes (Signed)
Patient episode of incontinence and emesis shortly after. Patient report episode of emesis one time earlier today. MD notified. RN will continue to monitor.
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
## 2017-11-23 ENCOUNTER — Inpatient Hospital Stay (HOSPITAL_COMMUNITY): Payer: Medicare Other

## 2017-11-23 DIAGNOSIS — D72829 Elevated white blood cell count, unspecified: Secondary | ICD-10-CM

## 2017-11-23 DIAGNOSIS — E785 Hyperlipidemia, unspecified: Secondary | ICD-10-CM

## 2017-11-23 DIAGNOSIS — G459 Transient cerebral ischemic attack, unspecified: Secondary | ICD-10-CM

## 2017-11-23 DIAGNOSIS — E1159 Type 2 diabetes mellitus with other circulatory complications: Secondary | ICD-10-CM

## 2017-11-23 DIAGNOSIS — I10 Essential (primary) hypertension: Secondary | ICD-10-CM

## 2017-11-23 DIAGNOSIS — C9190 Lymphoid leukemia, unspecified not having achieved remission: Secondary | ICD-10-CM

## 2017-11-23 DIAGNOSIS — R000 Tachycardia, unspecified: Secondary | ICD-10-CM

## 2017-11-23 LAB — BASIC METABOLIC PANEL
Anion gap: 10 (ref 5–15)
BUN: 13 mg/dL (ref 8–23)
CO2: 28 mmol/L (ref 22–32)
Calcium: 9 mg/dL (ref 8.9–10.3)
Chloride: 102 mmol/L (ref 98–111)
Creatinine, Ser: 1.31 mg/dL — ABNORMAL HIGH (ref 0.61–1.24)
GFR calc Af Amer: >60 mL/min (ref >60)
GFR calc non Af Amer: 54 mL/min — ABNORMAL LOW (ref >60)
Glucose, Bld: 167 mg/dL — ABNORMAL HIGH (ref 70–99)
Potassium: 3.5 mmol/L (ref 3.5–5.1)
Sodium: 140 mmol/L (ref 135–145)

## 2017-11-23 LAB — LIPID PANEL
Cholesterol: 90 mg/dL (ref 0–200)
HDL: 27 mg/dL — ABNORMAL LOW (ref >40)
LDL Cholesterol: 49 mg/dL (ref 0–99)
Total CHOL/HDL Ratio: 3.3 RATIO
Triglycerides: 72 mg/dL (ref <150)
VLDL: 14 mg/dL (ref 0–40)

## 2017-11-23 LAB — GLUCOSE, CAPILLARY
Glucose-Capillary: 170 mg/dL — ABNORMAL HIGH (ref 70–99)
Glucose-Capillary: 195 mg/dL — ABNORMAL HIGH (ref 70–99)
Glucose-Capillary: 194 mg/dL — ABNORMAL HIGH (ref 70–99)
Glucose-Capillary: 167 mg/dL — ABNORMAL HIGH (ref 70–99)

## 2017-11-23 LAB — CBC
HCT: 41.1 % (ref 39.0–52.0)
Hemoglobin: 14.7 g/dL (ref 13.0–17.0)
MCH: 31.4 pg (ref 26.0–34.0)
MCHC: 35.8 g/dL (ref 30.0–36.0)
MCV: 87.8 fL (ref 78.0–100.0)
Platelets: 180 10*3/uL (ref 150–400)
RBC: 4.68 MIL/uL (ref 4.22–5.81)
RDW: 12.3 % (ref 11.5–15.5)
WBC: 15.8 10*3/uL — ABNORMAL HIGH (ref 4.0–10.5)

## 2017-11-23 LAB — ECHOCARDIOGRAM COMPLETE
Height: 74 in
Weight: 4430.36 oz

## 2017-11-23 LAB — HEMOGLOBIN A1C
Hgb A1c MFr Bld: 6.7 % — ABNORMAL HIGH (ref 4.8–5.6)
Mean Plasma Glucose: 145.59 mg/dL

## 2017-11-23 LAB — MRSA PCR SCREENING: MRSA by PCR: NEGATIVE

## 2017-11-23 LAB — HIV ANTIBODY (ROUTINE TESTING W REFLEX): HIV Screen 4th Generation wRfx: NONREACTIVE

## 2017-11-23 MED ORDER — ATENOLOL 25 MG PO TABS
25.0000 mg | ORAL_TABLET | Freq: Every day | ORAL | Status: DC
  Administered 2017-11-23 – 2017-11-26 (×4): 25 mg via ORAL
  Filled 2017-11-23 (×4): qty 1

## 2017-11-23 MED ORDER — LISINOPRIL 20 MG PO TABS
40.0000 mg | ORAL_TABLET | Freq: Every day | ORAL | Status: DC
  Filled 2017-11-23: qty 2

## 2017-11-23 MED ORDER — POTASSIUM CHLORIDE CRYS ER 10 MEQ PO TBCR
10.0000 meq | EXTENDED_RELEASE_TABLET | Freq: Every day | ORAL | Status: DC
  Administered 2017-11-23 – 2017-11-26 (×4): 10 meq via ORAL
  Filled 2017-11-23 (×5): qty 1

## 2017-11-23 MED ORDER — GABAPENTIN 300 MG PO CAPS
300.0000 mg | ORAL_CAPSULE | Freq: Every day | ORAL | Status: DC
  Administered 2017-11-23 – 2017-11-25 (×3): 300 mg via ORAL
  Filled 2017-11-23 (×3): qty 1

## 2017-11-23 MED ORDER — HYDROCHLOROTHIAZIDE 50 MG PO TABS
50.0000 mg | ORAL_TABLET | Freq: Every day | ORAL | Status: DC
  Filled 2017-11-23: qty 2
  Filled 2017-11-23: qty 1

## 2017-11-23 MED ORDER — TIZANIDINE HCL 4 MG PO TABS
4.0000 mg | ORAL_TABLET | Freq: Two times a day (BID) | ORAL | Status: DC | PRN
  Administered 2017-11-25 – 2017-11-26 (×2): 4 mg via ORAL
  Filled 2017-11-23 (×2): qty 1

## 2017-11-23 MED ORDER — INSULIN ASPART 100 UNIT/ML ~~LOC~~ SOLN
0.0000 [IU] | Freq: Three times a day (TID) | SUBCUTANEOUS | Status: DC
  Administered 2017-11-23 – 2017-11-24 (×3): 2 [IU] via SUBCUTANEOUS
  Administered 2017-11-24 (×2): 3 [IU] via SUBCUTANEOUS
  Administered 2017-11-25: 1 [IU] via SUBCUTANEOUS
  Administered 2017-11-25: 3 [IU] via SUBCUTANEOUS
  Administered 2017-11-25: 2 [IU] via SUBCUTANEOUS

## 2017-11-23 MED ORDER — ALLOPURINOL 100 MG PO TABS
300.0000 mg | ORAL_TABLET | Freq: Every day | ORAL | Status: DC
  Administered 2017-11-23 – 2017-11-26 (×4): 300 mg via ORAL
  Filled 2017-11-23: qty 3
  Filled 2017-11-23 (×3): qty 1

## 2017-11-23 MED ORDER — AMLODIPINE BESYLATE 5 MG PO TABS
5.0000 mg | ORAL_TABLET | Freq: Every day | ORAL | Status: DC
  Administered 2017-11-23: 5 mg via ORAL
  Filled 2017-11-23: qty 1

## 2017-11-23 MED ORDER — SODIUM CHLORIDE 0.9 % IV SOLN
INTRAVENOUS | Status: DC
  Administered 2017-11-23 – 2017-11-24 (×2): via INTRAVENOUS

## 2017-11-23 MED ORDER — LISINOPRIL-HYDROCHLOROTHIAZIDE 20-25 MG PO TABS: 2.0000 | ORAL_TABLET | Freq: Every day | ORAL | Status: DC

## 2017-11-23 MED ORDER — ATORVASTATIN CALCIUM 40 MG PO TABS
40.0000 mg | ORAL_TABLET | Freq: Every day | ORAL | Status: DC
Start: 2017-11-23 — End: 2017-11-26
  Administered 2017-11-23 – 2017-11-26 (×4): 40 mg via ORAL
  Filled 2017-11-23 (×4): qty 1

## 2017-11-23 MED ORDER — TAMSULOSIN HCL 0.4 MG PO CAPS
0.4000 mg | ORAL_CAPSULE | Freq: Every day | ORAL | Status: DC
  Administered 2017-11-23 – 2017-11-26 (×4): 0.4 mg via ORAL
  Filled 2017-11-23 (×4): qty 1

## 2017-11-23 NOTE — Progress Notes (Signed)
STROKE TEAM PROGRESS NOTE   SUBJECTIVE (INTERVAL HISTORY) His wife and RN are at the bedside.  Overall he feels his condition is rapidly improving. They recounted HPI with me. Pt was driving back home yesterday afternoon without eating lunch. Wife stated that pt was driving abnormally. But pt did not feel that way. He felt body weakness but no dizziness or HA or visual changes. However, on arriving home, he could not get out of car and not able to walk well due to generalized weakness. He at baseline use walker to walk due to neck surgery. EMS called and he started to vomiting. He denies any vertigo, abdominal pain, double vision, hearing changes. He was given tPA in ER. MRI and CTA head and neck negative. Overnight, he felt much improved. However, his has persistent tachycardia. BP 130s.    OBJECTIVE Temp:  [97.9 F (36.6 C)-99.9 F (37.7 C)] 99.2 F (37.3 C) (07/03 0800) Pulse Rate:  [85-158] 101 (07/03 1030) Resp:  [12-31] 20 (07/03 1030) BP: (107-154)/(59-127) 123/84 (07/03 1030) SpO2:  [90 %-100 %] 92 % (07/03 1030) Weight:  [276 lb 14.4 oz (125.6 kg)-289 lb (131.1 kg)] 276 lb 14.4 oz (125.6 kg) (07/02 2208)  Recent Labs  Lab 11/22/17 2328 11/23/17 0826  GLUCAP 170* 170*   Recent Labs  Lab 11/22/17 1844 11/22/17 1858 11/23/17 0719  NA 141 140 140  K 3.3* 3.2* 3.5  CL 103 101 102  CO2 24  --  28  GLUCOSE 184* 180* 167*  BUN 12 13 13   CREATININE 1.31* 1.20 1.31*  CALCIUM 9.5  --  9.0   Recent Labs  Lab 11/22/17 1844  AST 26  ALT 23  ALKPHOS 59  BILITOT 1.1  PROT 7.2  ALBUMIN 4.1   Recent Labs  Lab 11/22/17 1844 11/22/17 1858 11/23/17 0719  WBC 11.4*  --  15.8*  NEUTROABS 9.1*  --   --   HGB 14.9 14.6 14.7  HCT 41.9 43.0 41.1  MCV 87.3  --  87.8  PLT 190  --  180   No results for input(s): CKTOTAL, CKMB, CKMBINDEX, TROPONINI in the last 168 hours. Recent Labs    11/22/17 1844  LABPROT 13.5  INR 1.04   Recent Labs    11/22/17 1933  COLORURINE  YELLOW  LABSPEC 1.017  PHURINE 6.0  GLUCOSEU NEGATIVE  HGBUR NEGATIVE  BILIRUBINUR NEGATIVE  KETONESUR 20*  PROTEINUR NEGATIVE  NITRITE NEGATIVE  LEUKOCYTESUR TRACE*       Component Value Date/Time   CHOL 90 11/23/2017 0227   TRIG 72 11/23/2017 0227   HDL 27 (L) 11/23/2017 0227   CHOLHDL 3.3 11/23/2017 0227   VLDL 14 11/23/2017 0227   LDLCALC 49 11/23/2017 0227   Lab Results  Component Value Date   HGBA1C 6.7 (H) 11/23/2017      Component Value Date/Time   LABOPIA NONE DETECTED 11/22/2017 1931   COCAINSCRNUR NONE DETECTED 11/22/2017 1931   LABBENZ NONE DETECTED 11/22/2017 1931   AMPHETMU NONE DETECTED 11/22/2017 1931   THCU NONE DETECTED 11/22/2017 1931   LABBARB (A) 11/22/2017 1931    Result not available. Reagent lot number recalled by manufacturer.    No results for input(s): ETH in the last 168 hours.  I have personally reviewed the radiological images below and agree with the radiology interpretations.  Ct Angio Head W Or Wo Contrast  Result Date: 11/22/2017 CLINICAL DATA:  Initial evaluation for acute dizziness, confusion. EXAM: CT ANGIOGRAPHY HEAD AND NECK TECHNIQUE: Multidetector CT  imaging of the head and neck was performed using the standard protocol during bolus administration of intravenous contrast. Multiplanar CT image reconstructions and MIPs were obtained to evaluate the vascular anatomy. Carotid stenosis measurements (when applicable) are obtained utilizing NASCET criteria, using the distal internal carotid diameter as the denominator. CONTRAST:  58mL ISOVUE-370 IOPAMIDOL (ISOVUE-370) INJECTION 76% COMPARISON:  Prior CT and MRI from earlier the same day. FINDINGS: CTA NECK FINDINGS Aortic arch: Visualized aortic arch of normal caliber with normal 3 vessel morphology. No flow-limiting stenosis about the origin of the great vessels. Visualized subclavian arteries widely patent. Right carotid system: Right common and internal carotid arteries are widely patent  without stenosis, dissection, or occlusion. No atheromatous narrowing about the right carotid bifurcation. Left carotid system: Left common and internal carotid arteries widely patent without stenosis, dissection, or occlusion. No atheromatous narrowing about the left carotid bifurcation. Vertebral arteries: Both of the vertebral arteries arise from the subclavian arteries. Vertebral arteries patent within the neck without stenosis, dissection, or occlusion. Evaluation the vertebral arteries mildly limited at the C3 through C5 levels due to adjacent ACDF. Skeleton: No acute osseous abnormality. No worrisome lytic or blastic osseous lesions. Patient is status post prior cervical fusion at C3 through C7. Other neck: No acute soft tissue abnormality within the neck. 17 mm hypodense right thyroid nodule, indeterminate. Upper chest: Visualized upper chest demonstrates no acute finding. Scattered atelectatic changes noted dependently within the visualized lungs. Visualized lungs are otherwise clear. Review of the MIP images confirms the above findings CTA HEAD FINDINGS Anterior circulation: Internal carotid arteries patent to the termini without stenosis. Minimal plaque within the cavernous ICAs bilaterally. A1 segments patent bilaterally. Normal anterior communicating artery. Azygos ACA, patent to its distal aspect. M1 segments patent without stenosis or occlusion. No proximal M2 occlusion. Distal MCA branches well perfused and symmetric. Posterior circulation: Vertebral arteries patent to the vertebrobasilar junction without stenosis. Left vertebral artery slightly dominant. Patent left PICA. Right PICA not well visualized. Basilar widely patent to its distal aspect. Superior cerebellar and posterior cerebral arteries widely patent bilaterally. Venous sinuses: Patent. Anatomic variants: Azygos ACA.  No intracranial aneurysm. Delayed phase: No abnormal enhancement. Review of the MIP images confirms the above findings  IMPRESSION: 1. Negative CTA with no evidence for large vessel occlusion. No hemodynamically significant or correctable stenosis identified. 2. Azygos ACA. 3. 1.7 cm right thyroid nodule, indeterminate. Follow-up examination with dedicated thyroid ultrasound recommended for further characterization. Electronically Signed   By: Jeannine Boga M.D.   On: 11/22/2017 22:14   Ct Angio Neck W And/or Wo Contrast  Result Date: 11/22/2017 CLINICAL DATA:  Initial evaluation for acute dizziness, confusion. EXAM: CT ANGIOGRAPHY HEAD AND NECK TECHNIQUE: Multidetector CT imaging of the head and neck was performed using the standard protocol during bolus administration of intravenous contrast. Multiplanar CT image reconstructions and MIPs were obtained to evaluate the vascular anatomy. Carotid stenosis measurements (when applicable) are obtained utilizing NASCET criteria, using the distal internal carotid diameter as the denominator. CONTRAST:  75mL ISOVUE-370 IOPAMIDOL (ISOVUE-370) INJECTION 76% COMPARISON:  Prior CT and MRI from earlier the same day. FINDINGS: CTA NECK FINDINGS Aortic arch: Visualized aortic arch of normal caliber with normal 3 vessel morphology. No flow-limiting stenosis about the origin of the great vessels. Visualized subclavian arteries widely patent. Right carotid system: Right common and internal carotid arteries are widely patent without stenosis, dissection, or occlusion. No atheromatous narrowing about the right carotid bifurcation. Left carotid system: Left common and internal carotid arteries  widely patent without stenosis, dissection, or occlusion. No atheromatous narrowing about the left carotid bifurcation. Vertebral arteries: Both of the vertebral arteries arise from the subclavian arteries. Vertebral arteries patent within the neck without stenosis, dissection, or occlusion. Evaluation the vertebral arteries mildly limited at the C3 through C5 levels due to adjacent ACDF. Skeleton: No  acute osseous abnormality. No worrisome lytic or blastic osseous lesions. Patient is status post prior cervical fusion at C3 through C7. Other neck: No acute soft tissue abnormality within the neck. 17 mm hypodense right thyroid nodule, indeterminate. Upper chest: Visualized upper chest demonstrates no acute finding. Scattered atelectatic changes noted dependently within the visualized lungs. Visualized lungs are otherwise clear. Review of the MIP images confirms the above findings CTA HEAD FINDINGS Anterior circulation: Internal carotid arteries patent to the termini without stenosis. Minimal plaque within the cavernous ICAs bilaterally. A1 segments patent bilaterally. Normal anterior communicating artery. Azygos ACA, patent to its distal aspect. M1 segments patent without stenosis or occlusion. No proximal M2 occlusion. Distal MCA branches well perfused and symmetric. Posterior circulation: Vertebral arteries patent to the vertebrobasilar junction without stenosis. Left vertebral artery slightly dominant. Patent left PICA. Right PICA not well visualized. Basilar widely patent to its distal aspect. Superior cerebellar and posterior cerebral arteries widely patent bilaterally. Venous sinuses: Patent. Anatomic variants: Azygos ACA.  No intracranial aneurysm. Delayed phase: No abnormal enhancement. Review of the MIP images confirms the above findings IMPRESSION: 1. Negative CTA with no evidence for large vessel occlusion. No hemodynamically significant or correctable stenosis identified. 2. Azygos ACA. 3. 1.7 cm right thyroid nodule, indeterminate. Follow-up examination with dedicated thyroid ultrasound recommended for further characterization. Electronically Signed   By: Jeannine Boga M.D.   On: 11/22/2017 22:14   Mr Brain Wo Contrast  Result Date: 11/22/2017 CLINICAL DATA:  Ataxia.  Difficulty speaking.  Given tPA. EXAM: MRI HEAD WITHOUT CONTRAST TECHNIQUE: Multiplanar, multiecho pulse sequences of the  brain and surrounding structures were obtained without intravenous contrast. COMPARISON:  Head CT 11/22/2017 FINDINGS: An abbreviated protocol was performed at the direction of the attending stroke neurologist. Axial and coronal diffusion, sagittal T1, and axial FLAIR sequences were obtained Brain: There is no evidence of acute infarct, intracranial hemorrhage, mass, midline shift, or extra-axial fluid collection. Mild generalized cerebral atrophy is within normal limits for age. No significant cerebral white matter disease is seen. Vascular: Limited assessment on this abbreviated MRI. Skull and upper cervical spine: No suspicious marrow lesion. Prior anterior cervical fusion. Sinuses/Orbits: No acute findings. Other: None. IMPRESSION: Abbreviated brain MRI without evidence of acute infarct or other significant intracranial abnormality. Electronically Signed   By: Logan Bores M.D.   On: 11/22/2017 20:47   Ct Head Code Stroke Wo Contrast  Result Date: 11/22/2017 CLINICAL DATA:  Code stroke. Dizziness, confusion, nausea, and vomiting. EXAM: CT HEAD WITHOUT CONTRAST TECHNIQUE: Contiguous axial images were obtained from the base of the skull through the vertex without intravenous contrast. COMPARISON:  05/22/2016 FINDINGS: Brain: There is no evidence of acute infarct, intracranial hemorrhage, mass, midline shift, or extra-axial fluid collection. Mild generalized cerebral atrophy is not greater than expected for age. Vascular: Calcified atherosclerosis at the skull base. No hyperdense vessel. Skull: No fracture or focal osseous lesion. Sinuses/Orbits: Visualized paranasal sinuses and mastoid air cells are clear. Orbits are unremarkable. Other: Numerous dermal calcifications at the vertex. ASPECTS [facility] Stroke Program Early CT Score) Not scored with this history. IMPRESSION: No evidence of acute intracranial abnormality. These results were communicated to Dr. Lorraine Lax  at 7:15 pm on 11/22/2017 by text page via the [facility]  messaging system. Electronically Signed   By: Logan Bores M.D.   On: 11/22/2017 19:15    TTE pending   PHYSICAL EXAM  Temp:  [97.9 F (36.6 C)-99.9 F (37.7 C)] 99.2 F (37.3 C) (07/03 0800) Pulse Rate:  [85-158] 101 (07/03 1030) Resp:  [12-31] 20 (07/03 1030) BP: (107-154)/(59-127) 123/84 (07/03 1030) SpO2:  [90 %-100 %] 92 % (07/03 1030) Weight:  [276 lb 14.4 oz (125.6 kg)-289 lb (131.1 kg)] 276 lb 14.4 oz (125.6 kg) (07/02 2208)  General - Well nourished, well developed, in no apparent distress.  Ophthalmologic - fundi not visualized due to noncooperation.  Cardiovascular - Regular rhythm, but tachycardia.  Mental Status -  Level of arousal and orientation to time, place, and person were intact. Language including expression, naming, repetition, comprehension was assessed and found intact.  Cranial Nerves II - XII - II - Visual field intact OU. III, IV, VI - Extraocular movements intact. V - Facial sensation intact bilaterally. VII - Facial movement intact bilaterally. VIII - Hearing & vestibular intact bilaterally. X - Palate elevates symmetrically. XI - Chin turning & shoulder shrug intact bilaterally. XII - Tongue protrusion intact.  Motor Strength - The patient's strength was symmetrical in all extremities and pronator drift was absent.  Bulk was normal and fasciculations were absent.   Motor Tone - Muscle tone was assessed at the neck and appendages and was normal.  Reflexes - The patient's reflexes were symmetrical in all extremities and he had no pathological reflexes.  Sensory - Light touch, temperature/pinprick were assessed and were symmetrical.    Coordination - The patient had normal movements in the hands with no ataxia or dysmetria.  Tremor was absent.  Gait and Station - deferred.   ASSESSMENT/PLAN [name] is a 72 y.o. male with history of CAD, DM, HTN, CLL, cervical myelopathy s/p decompression in 03/2017 currently walk with walker  admitted for vomiting, generalized weakness. tPA given.    Suspected posterior stroke s/p tPA vs. Vasovagal pre-syncope vs. Other stroke mimics  Resultant tachycardia  MRI  No acute infarct  CTA head and neck unremarkable  2D Echo  pending  LDL 49  HgbA1c 6.7  SCDs for VTE prophylaxis  aspirin 81 mg daily prior to admission, now on No antithrombotic s/p tPA within 24h  Patient counseled to be compliant with his antithrombotic medications  Ongoing aggressive stroke risk factor management  Therapy recommendations:  Pending   Disposition:  Pending   Diabetes  HgbA1c 6.7 goal < 7.0  Controlled  CBG monitoring  SSI  Hypertension Stable Permissive hypertension (OK if <220/120) for 24-48 hours post stroke and then gradually normalized within 5-7 days. Resumed atenolol Hold off other BP home meds  Long term BP goal normotensive  Hyperlipidemia  Home meds:  lipitor   LDL 49, goal < 70  Now on home lipitor  Continue statin at discharge  Tachycardia   Unclear etiology  Continue UVF  Encourage po intake   BP stable  Leukocytosis  WBC 11.4->15.8  Hx of CLL  Monitoring CBC with diff  UA WBC 6-10  afebrile   Other Stroke Risk Factors  Advanced age  ETOH use  Obesity, Body mass index is 35.55 kg/m.   Coronary artery disease  Other Active Problems  Hypokalemia   Hospital day # 1  This patient is critically ill due to suspected stroke s/p tPA, tachycardia, leukocytosis and at significant  risk of neurological worsening, death form stroke, bleeding, heart failure. This patient's care requires constant monitoring of vital signs, hemodynamics, respiratory and cardiac monitoring, review of multiple databases, neurological assessment, discussion with family, other specialists and medical decision making of high complexity. I spent 35 minutes of neurocritical care time in the care of this patient.  Rosalin Hawking, MD PhD Stroke  Neurology 11/23/2017 11:01 AM    To contact Stroke Continuity provider, please refer to [email or web address] After hours, contact General Neurology

## 2017-11-23 NOTE — Progress Notes (Signed)
OT Cancellation Note  Patient Details Name: Ronald Rice MRN: [id number] DOB: 07-17-1945   Cancelled Treatment:    Reason Eval/Treat Not Completed: Active bedrest order. Pt with orders for bedrest s/p tPA. Will check back as appropriate for OT evaluation.   Norman Herrlich, MS OTR/L  Pager: [name] 11/23/2017, 7:04 AM

## 2017-11-23 NOTE — Care Management Note (Signed)
Case Management Note  Patient Details  Name: Leyland Kenna MRN: [id number] Date of Birth: 1946/03/05  Subjective/Objective:  Pt admitted on 11/22/17 with sudden onset confusion and gait difficulty.  Pt given TPA for suspected stroke; MRI showed no evidence of any acute infarct.  PTA, pt independent, lives with spouse.                    Action/Plan: PT/OT evaluations pending due to increased HR.  Will follow for discharge needs as pt progresses.   Expected Discharge Date:                  Expected Discharge Plan:     In-House Referral:     Discharge planning Services  CM Consult  Post Acute Care Choice:    Choice offered to:     DME Arranged:    DME Agency:     HH Arranged:    HH Agency:     Status of Service:  In process, will continue to follow  If discussed at Long Length of Stay Meetings, dates discussed:    Additional Comments:  Reinaldo Raddle, RN, BSN  Trauma/Neuro ICU Case Manager [phone]

## 2017-11-23 NOTE — Progress Notes (Signed)
SLP Cancellation Note  Patient Details Name: Ronald Rice MRN: [id number] DOB: 1946/04/21   Cancelled treatment:       Reason Eval/Treat Not Completed: SLP screened, no needs identified, will sign off. Per notes, MRI negative, no overt deficits on neuro assessment. Will defer SLP eval, reorder if needed.    Verdella Laidlaw, Katherene Ponto 11/23/2017, 2:19 PM

## 2017-11-23 NOTE — Progress Notes (Signed)
PT Cancellation Note  Patient Details Name: Ronald Rice MRN: [id number] DOB: 1945-11-27   Cancelled Treatment:    Reason Eval/Treat Not Completed: Patient not medically ready.  Pt HR tachy.  Dr Erlinda Hong request another 24 hours of bedrest. 11/23/2017  Donnella Sham, [place] [phone]  (pager)   Tessie Fass L[place] Cen 11/23/2017, 11:04 AM

## 2017-11-23 NOTE — Progress Notes (Signed)
  Echocardiogram 2D Echocardiogram has been performed.  Ronald Rice 11/23/2017, 12:42 PM

## 2017-11-24 ENCOUNTER — Inpatient Hospital Stay (HOSPITAL_COMMUNITY): Payer: Medicare Other

## 2017-11-24 DIAGNOSIS — N281 Cyst of kidney, acquired: Secondary | ICD-10-CM

## 2017-11-24 DIAGNOSIS — R509 Fever, unspecified: Secondary | ICD-10-CM

## 2017-11-24 DIAGNOSIS — A419 Sepsis, unspecified organism: Secondary | ICD-10-CM

## 2017-11-24 DIAGNOSIS — N10 Acute tubulo-interstitial nephritis: Secondary | ICD-10-CM

## 2017-11-24 DIAGNOSIS — R6520 Severe sepsis without septic shock: Secondary | ICD-10-CM

## 2017-11-24 LAB — URINALYSIS, ROUTINE W REFLEX MICROSCOPIC
Bacteria, UA: NONE SEEN
Bilirubin Urine: NEGATIVE
Bilirubin Urine: NEGATIVE
Glucose, UA: NEGATIVE mg/dL
Glucose, UA: NEGATIVE mg/dL
Ketones, ur: 20 mg/dL — AB
Ketones, ur: 5 mg/dL — AB
Leukocytes, UA: NEGATIVE
Nitrite: NEGATIVE
Nitrite: NEGATIVE
Protein, ur: NEGATIVE mg/dL
Protein, ur: NEGATIVE mg/dL
Specific Gravity, Urine: 1.018 (ref 1.005–1.030)
Specific Gravity, Urine: 1.016 (ref 1.005–1.030)
WBC, UA: >50 WBC/hpf — ABNORMAL HIGH (ref 0–5)
pH: 5 (ref 5.0–8.0)
pH: 5 (ref 5.0–8.0)

## 2017-11-24 LAB — CBC WITH DIFFERENTIAL/PLATELET
Abs Immature Granulocytes: 0.2 10*3/uL — ABNORMAL HIGH (ref 0.0–0.1)
Basophils Absolute: 0.1 10*3/uL (ref 0.0–0.1)
Basophils Relative: 0 %
Eosinophils Absolute: 0 10*3/uL (ref 0.0–0.7)
Eosinophils Relative: 0 %
HCT: 42.8 % (ref 39.0–52.0)
Hemoglobin: 15.1 g/dL (ref 13.0–17.0)
Immature Granulocytes: 1 %
Lymphocytes Relative: 12 %
Lymphs Abs: 2 10*3/uL (ref 0.7–4.0)
MCH: 31.2 pg (ref 26.0–34.0)
MCHC: 35.3 g/dL (ref 30.0–36.0)
MCV: 88.4 fL (ref 78.0–100.0)
Monocytes Absolute: 1 10*3/uL (ref 0.1–1.0)
Monocytes Relative: 6 %
Neutro Abs: 13.6 10*3/uL — ABNORMAL HIGH (ref 1.7–7.7)
Neutrophils Relative %: 81 %
Platelets: 153 10*3/uL (ref 150–400)
RBC: 4.84 MIL/uL (ref 4.22–5.81)
RDW: 12.5 % (ref 11.5–15.5)
WBC: 16.8 10*3/uL — ABNORMAL HIGH (ref 4.0–10.5)

## 2017-11-24 LAB — BASIC METABOLIC PANEL
Anion gap: 9 (ref 5–15)
BUN: 13 mg/dL (ref 8–23)
CO2: 28 mmol/L (ref 22–32)
Calcium: 8.7 mg/dL — ABNORMAL LOW (ref 8.9–10.3)
Chloride: 100 mmol/L (ref 98–111)
Creatinine, Ser: 1.07 mg/dL (ref 0.61–1.24)
GFR calc Af Amer: >60 mL/min (ref >60)
GFR calc non Af Amer: >60 mL/min (ref >60)
Glucose, Bld: 146 mg/dL — ABNORMAL HIGH (ref 70–99)
Potassium: 3.8 mmol/L (ref 3.5–5.1)
Sodium: 137 mmol/L (ref 135–145)

## 2017-11-24 LAB — COMPREHENSIVE METABOLIC PANEL
ALT: 18 U/L (ref 0–44)
AST: 23 U/L (ref 15–41)
Albumin: 3.3 g/dL — ABNORMAL LOW (ref 3.5–5.0)
Alkaline Phosphatase: 60 U/L (ref 38–126)
Anion gap: 12 (ref 5–15)
BUN: 11 mg/dL (ref 8–23)
CO2: 27 mmol/L (ref 22–32)
Calcium: 8.4 mg/dL — ABNORMAL LOW (ref 8.9–10.3)
Chloride: 98 mmol/L (ref 98–111)
Creatinine, Ser: 1.27 mg/dL — ABNORMAL HIGH (ref 0.61–1.24)
GFR calc Af Amer: >60 mL/min (ref >60)
GFR calc non Af Amer: 56 mL/min — ABNORMAL LOW (ref >60)
Glucose, Bld: 213 mg/dL — ABNORMAL HIGH (ref 70–99)
Potassium: 3.3 mmol/L — ABNORMAL LOW (ref 3.5–5.1)
Sodium: 137 mmol/L (ref 135–145)
Total Bilirubin: 1.7 mg/dL — ABNORMAL HIGH (ref 0.3–1.2)
Total Protein: 6.2 g/dL — ABNORMAL LOW (ref 6.5–8.1)

## 2017-11-24 LAB — LACTIC ACID, PLASMA
Lactic Acid, Venous: 1.1 mmol/L (ref 0.5–1.9)
Lactic Acid, Venous: 1.2 mmol/L (ref 0.5–1.9)

## 2017-11-24 LAB — CBC
HCT: 38.6 % — ABNORMAL LOW (ref 39.0–52.0)
Hemoglobin: 13.7 g/dL (ref 13.0–17.0)
MCH: 31.2 pg (ref 26.0–34.0)
MCHC: 35.5 g/dL (ref 30.0–36.0)
MCV: 87.9 fL (ref 78.0–100.0)
Platelets: 126 10*3/uL — ABNORMAL LOW (ref 150–400)
RBC: 4.39 MIL/uL (ref 4.22–5.81)
RDW: 12.2 % (ref 11.5–15.5)
WBC: 16.8 10*3/uL — ABNORMAL HIGH (ref 4.0–10.5)

## 2017-11-24 LAB — GLUCOSE, CAPILLARY
Glucose-Capillary: 149 mg/dL — ABNORMAL HIGH (ref 70–99)
Glucose-Capillary: 164 mg/dL — ABNORMAL HIGH (ref 70–99)
Glucose-Capillary: 211 mg/dL — ABNORMAL HIGH (ref 70–99)
Glucose-Capillary: 202 mg/dL — ABNORMAL HIGH (ref 70–99)
Glucose-Capillary: 152 mg/dL — ABNORMAL HIGH (ref 70–99)

## 2017-11-24 LAB — PROCALCITONIN: Procalcitonin: 0.47 ng/mL

## 2017-11-24 MED ORDER — SODIUM CHLORIDE 0.9 % IV BOLUS (SEPSIS): 1000.0000 mL | Freq: Once | INTRAVENOUS | Status: DC

## 2017-11-24 MED ORDER — AMLODIPINE BESYLATE 5 MG PO TABS
5.0000 mg | ORAL_TABLET | Freq: Every day | ORAL | Status: DC
  Administered 2017-11-24 – 2017-11-26 (×3): 5 mg via ORAL
  Filled 2017-11-24 (×3): qty 1

## 2017-11-24 MED ORDER — PIPERACILLIN-TAZOBACTAM 3.375 G IVPB 30 MIN
3.3750 g | Freq: Once | INTRAVENOUS | Status: AC
  Administered 2017-11-24: 3.375 g via INTRAVENOUS
  Filled 2017-11-24: qty 50

## 2017-11-24 MED ORDER — PIPERACILLIN-TAZOBACTAM 3.375 G IVPB
3.3750 g | Freq: Three times a day (TID) | INTRAVENOUS | Status: DC
  Administered 2017-11-24 – 2017-11-26 (×6): 3.375 g via INTRAVENOUS
  Filled 2017-11-24 (×7): qty 50

## 2017-11-24 MED ORDER — ASPIRIN EC 81 MG PO TBEC
81.0000 mg | DELAYED_RELEASE_TABLET | Freq: Every day | ORAL | Status: DC
  Administered 2017-11-24 – 2017-11-26 (×3): 81 mg via ORAL
  Filled 2017-11-24 (×3): qty 1

## 2017-11-24 MED ORDER — SODIUM CHLORIDE 0.9 % IV BOLUS (SEPSIS)
1000.0000 mL | Freq: Once | INTRAVENOUS | Status: AC
  Administered 2017-11-24: 1000 mL via INTRAVENOUS

## 2017-11-24 MED ORDER — SODIUM CHLORIDE 0.9 % IV SOLN
INTRAVENOUS | Status: DC
  Administered 2017-11-24 – 2017-11-25 (×2): via INTRAVENOUS

## 2017-11-24 NOTE — Progress Notes (Signed)
MD notified of patient neuro status change and elevated oral temp 103.2, heart rate 117. RN will continue to monitor and administer tylenol as directed.

## 2017-11-24 NOTE — Consult Note (Signed)
PULMONARY / CRITICAL CARE MEDICINE   Name: Ronald Rice MRN: [id number] DOB: 1946-05-17    ADMISSION DATE:  11/22/2017 CONSULTATION DATE:  11/24/2017  REFERRING MD:  Dr. Erlinda Hong  CHIEF COMPLAINT:  confusion  HISTORY OF PRESENT ILLNESS:   72 y/o male with CLL was admitted with generalized weakness, unsteady gait on 7/2 and was treated for presumed acute ischemic stroke with IV tPA.  The patient's MRI of the brain was within normal limits.  After 2 days of his hospitalization he remained confused, developed a fever, tachycardia and tachypnea so pulmonary and critical care medicine was consulted for further evaluation.  The patient's wife provides the history because he is slightly confused.  She says that for 1 week prior to admission he was taking an antibiotic because of bronchitis.  He did not complete the course of antibiotics.  She also notes that in late June he was using a urinary catheter placed by the urologist for approximately 1 week.  She says that he has had problems with hematuria and dysuria over the years but recently has not been complaining of dysuria.  She denies recent nausea vomiting or diarrhea.  PAST MEDICAL HISTORY :  He  has a past medical history of Cancer ([place]), CLL (chronic lymphocytic leukemia) ([place]), Coronary artery disease, Diabetes mellitus without complication ([place]), GERD (gastroesophageal reflux disease), and Hypertension.  PAST SURGICAL HISTORY: He  has a past surgical history that includes Back surgery; Colonoscopy; and Anterior cervical decomp/discectomy fusion (N/A, 04/20/2017).  No Known Allergies  No current facility-administered medications on file prior to encounter.    Current Outpatient Medications on File Prior to Encounter  Medication Sig  . allopurinol (ZYLOPRIM) 300 MG tablet Take 300 mg by mouth daily.  Marland Kitchen amLODipine (NORVASC) 5 MG tablet Take 5 mg by mouth daily.  Marland Kitchen aspirin EC 81 MG tablet Take 81 mg by mouth daily.  Marland Kitchen atenolol (TENORMIN) 25 MG  tablet Take 25 mg by mouth daily.   Marland Kitchen atorvastatin (LIPITOR) 40 MG tablet Take 40 mg by mouth daily.  . cephALEXin (KEFLEX) 500 MG capsule Take 500 mg by mouth 2 (two) times daily.  Marland Kitchen CIALIS 20 MG tablet Take 20 mg by mouth as needed for erectile dysfunction.  . Cinnamon 500 MG TABS Take 500 mg by mouth daily.  . famotidine (PEPCID) 20 MG tablet Take 20 mg by mouth at bedtime.  . gabapentin (NEURONTIN) 300 MG capsule Take 1 capsule (300 mg total) by mouth at bedtime.  Jonna Clark Jelly (GINSENG COMPLEX/ROYAL JELLY) 800-200 MG CAPS Take 1 capsule by mouth daily.  Marland Kitchen ketoconazole (NIZORAL) 2 % cream Apply 1 application topically as needed for irritation.  Marland Kitchen lisinopril-hydrochlorothiazide (PRINZIDE,ZESTORETIC) 20-25 MG tablet Take 2 tablets by mouth daily.  . Multiple Vitamins-Minerals (CENTRUM SILVER 50+MEN PO) Take 1 tablet by mouth daily.  . naproxen sodium (ALEVE) 220 MG tablet Take 220 mg by mouth 2 (two) times daily as needed (PAIN).  . NON FORMULARY Take 1 tablet by mouth daily. Blood Sugar Defense   . potassium chloride (K-DUR) 10 MEQ tablet Take 10 mEq by mouth daily.   . tamsulosin (FLOMAX) 0.4 MG CAPS capsule Take 0.4 mg by mouth daily.  Marland Kitchen tiZANidine (ZANAFLEX) 4 MG tablet Take 4 mg by mouth every 12 (twelve) hours as needed for muscle spasms.  . Xylometazoline HCl (4-WAY NASAL SPRAY NA) Place 1 spray into both nostrils as needed (for allergies).    FAMILY HISTORY:  His indicated that his mother is deceased. He  indicated that his father is deceased.   SOCIAL HISTORY: He  reports that he has never smoked. He has never used smokeless tobacco. He reports that he drinks alcohol. He reports that he does not use drugs.  REVIEW OF SYSTEMS:   Cannot obtain due to confusion  SUBJECTIVE:  As above  VITAL SIGNS: BP (!) 152/93   Pulse (!) 110   Temp (!) 102.3 F (39.1 C) (Oral)   Resp (!) 30   Ht 6\' 2"  (1.88 m)   Wt 125.6 kg (276 lb 14.4 oz)   SpO2 96%   BMI 35.55 kg/m    HEMODYNAMICS:    VENTILATOR SETTINGS:    INTAKE / OUTPUT: I/O last 3 completed shifts: In: 2204.7 [I.V.:2204.7] Out: 2025 [Urine:1825; Emesis/NG output:200]  PHYSICAL EXAMINATION:  General:  Resting comfortably in bed HENT: NCAT OP clear PULM: CTA B, normal effort CV: RRR, no mgr, radial pulses intact GI: BS+, soft, nontender MSK: normal bulk and tone Neuro: drowsy but will wake up, speech clear, follows commands, moves all four extremities Derm: Cap refill is within normal limits  LABS:  BMET Recent Labs  Lab 11/22/17 1844 11/22/17 1858 11/23/17 0719 11/24/17 0400  NA 141 140 140 137  K 3.3* 3.2* 3.5 3.8  CL 103 101 102 100  CO2 24  --  28 28  BUN 12 13 13 13   CREATININE 1.31* 1.20 1.31* 1.07  GLUCOSE 184* 180* 167* 146*    Electrolytes Recent Labs  Lab 11/22/17 1844 11/23/17 0719 11/24/17 0400  CALCIUM 9.5 9.0 8.7*    CBC Recent Labs  Lab 11/22/17 1844 11/22/17 1858 11/23/17 0719 11/24/17 0400  WBC 11.4*  --  15.8* 16.8*  HGB 14.9 14.6 14.7 15.1  HCT 41.9 43.0 41.1 42.8  PLT 190  --  180 153    Coag's Recent Labs  Lab 11/22/17 1844  APTT 25  INR 1.04    Sepsis Markers Recent Labs  Lab 11/24/17 0916  LATICACIDVEN 1.1    ABG No results for input(s): PHART, PCO2ART, PO2ART in the last 168 hours.  Liver Enzymes Recent Labs  Lab 11/22/17 1844  AST 26  ALT 23  ALKPHOS 59  BILITOT 1.1  ALBUMIN 4.1    Cardiac Enzymes No results for input(s): TROPONINI, PROBNP in the last 168 hours.  Glucose Recent Labs  Lab 11/23/17 0826 11/23/17 1129 11/23/17 1639 11/23/17 2016 11/24/17 0436 11/24/17 0744  GLUCAP 170* 195* 194* 167* 149* 164*    Imaging Ct Head Wo Contrast  Result Date: 11/23/2017 CLINICAL DATA:  24 hour tPA follow-up EXAM: CT HEAD WITHOUT CONTRAST TECHNIQUE: Contiguous axial images were obtained from the base of the skull through the vertex without intravenous contrast. COMPARISON:  Head CT 11/22/2017  FINDINGS: Brain: There is no mass, hemorrhage or extra-axial collection. The size and configuration of the ventricles and extra-axial CSF spaces are normal. There is no acute or chronic infarction. The brain parenchyma is normal. Vascular: No abnormal hyperdensity of the major intracranial arteries or dural venous sinuses. No intracranial atherosclerosis. Skull: The visualized skull base, calvarium and extracranial soft tissues are normal. Sinuses/Orbits: No fluid levels or advanced mucosal thickening of the visualized paranasal sinuses. No mastoid or middle ear effusion. The orbits are normal. IMPRESSION: No intracranial hemorrhage. Electronically Signed   By: Ulyses Jarred M.D.   On: 11/23/2017 20:12   Dg Chest Port 1 View  Result Date: 11/24/2017 CLINICAL DATA:  Fevers EXAM: PORTABLE CHEST 1 VIEW COMPARISON:  04/18/2017 FINDINGS: Cardiac  shadow is within normal limits. Lungs are well aerated bilaterally but hypoinflated. No focal infiltrate or sizable effusion is seen. No bony abnormality is noted. IMPRESSION: No active disease. Electronically Signed   By: Inez Catalina M.D.   On: 11/24/2017 07:50     STUDIES:  July 2 CT angiogram head: Negative CTA for large vessel occlusion, 1.7 cm right thyroid nodule November 22, 2017 MRI brain no acute abnormality November 23, 2017 CT head no intracranial hemorrhage  CULTURES: 7/4 blood >  7/4 urine >   ANTIBIOTICS: 7/4 zosyn >   SIGNIFICANT EVENTS:   LINES/TUBES:   DISCUSSION: 72 year old male with a recent episode of bronchitis and recently requiring long-term catheterization for bladder outlet obstruction presented to our facility with generalized weakness on July 2.  Treated for stroke but MRI brain showed no evidence of that.  Now with a fever and vital signs consistent with sepsis.  Chest x-ray today is clear.  Differential diagnosis of infectious source includes urinary as he has pyuria and a recent history of catheterization of his urine versus a  respiratory source (bronchitis?)  No clear evidence of pneumonia.  ASSESSMENT / PLAN:  PULMONARY A: Recent bronchitis P:   Monitor respiratory status Monitor O2 saturation  CARDIOVASCULAR A:  Sinus tachycardia secondary to sepsis, not in septic shock, extremities now warm and well-perfused P:  Continue telemetry monitoring Continue blood pressure monitoring (currently hyper tensive) Administer 1 L of normal saline now  RENAL A:   Recent bladder outlet obstruction with Foley catheter Hematuria, traumatic Foley insertion on admission P:   Try to avoid Foley catheterization In and out catheterization now for urine culture Monitor BMET and UOP Replace electrolytes as needed   GASTROINTESTINAL A:   No acute issues P:   Diet as tolerated  HEMATOLOGIC A:   No acute issues P:  Monitor for bleeding  INFECTIOUS A:   Sepsis, likely urinary tract infection P:   Zosyn now Saline now Urine culture now Close monitoring in ICU environment  ENDOCRINE A:   DM2 P:   Continue SSI  NEUROLOGIC A:   Acute encephalopathy, due to infection P:   Treat underlying infection No sedating medications   FAMILY  - Updates: Wife updated bedside by me  Roselie Awkward, MD [facility] Pager: [phone] Cell: [phone] After 3pm or if no response, call [phone]   11/24/2017, 10:59 AM ]

## 2017-11-24 NOTE — Plan of Care (Signed)
  Problem: Education: Goal: Knowledge of General Education information will improve Outcome: Progressing   Problem: Health Behavior/Discharge Planning: Goal: Ability to manage health-related needs will improve Outcome: Progressing   Problem: Clinical Measurements: Goal: Ability to maintain clinical measurements within normal limits will improve Outcome: Progressing Goal: Will remain free from infection Outcome: Progressing Goal: Diagnostic test results will improve Outcome: Progressing Goal: Respiratory complications will improve Outcome: Progressing Goal: Cardiovascular complication will be avoided Outcome: Progressing   Problem: Activity: Goal: Risk for activity intolerance will decrease Outcome: Progressing   Problem: Nutrition: Goal: Adequate nutrition will be maintained Outcome: Progressing   Problem: Coping: Goal: Level of anxiety will decrease Outcome: Progressing   Problem: Elimination: Goal: Will not experience complications related to bowel motility Outcome: Progressing Goal: Will not experience complications related to urinary retention Outcome: Progressing   Problem: Pain Managment: Goal: General experience of comfort will improve Outcome: Progressing   Problem: Safety: Goal: Ability to remain free from injury will improve Outcome: Progressing   Problem: Skin Integrity: Goal: Risk for impaired skin integrity will decrease Outcome: Progressing   Problem: Education: Goal: Knowledge of disease or condition will improve Outcome: Progressing Goal: Knowledge of secondary prevention will improve Outcome: Progressing Goal: Knowledge of patient specific risk factors addressed and post discharge goals established will improve Outcome: Progressing   Problem: Ischemic Stroke/TIA Tissue Perfusion: Goal: Complications of ischemic stroke/TIA will be minimized Outcome: Progressing  Pt is alert and oriented to person and place, but not year and situation.   Problem: Fluid Volume: Goal: Hemodynamic stability will improve Outcome: Progressing   Problem: Clinical Measurements: Goal: Signs and symptoms of infection will decrease Outcome: Not Progressing Pt with increasing heart and respiratory rate, febrile to 103. Sepsis workup done and broad spectrum antibiotics initiated. Will continue to monitor.   Problem: Respiratory: Goal: Ability to maintain adequate ventilation will improve Outcome: Progressing

## 2017-11-24 NOTE — Evaluation (Addendum)
Physical Therapy Evaluation Patient Details Name: Ronald Rice MRN: [id number] DOB: 12/15/1945 Today's Date: 11/24/2017   History of Present Illness  This 72 y.o. male admitted with generalized weakness and unsteady gait.  Head CT negative for acute infarct, and he received tPA due to presumed ischemic CVA.  MRI with no acute infarct.  He has remained confused and developed fever, tachycardia, and tachypneic.   Dx:  Likely sepsis due to UTI, acute encephalopathy.  PMH includes:  CLL, CAD, DM, GERD, HTN, recent bladder outlet obstruction., c/p ACDF C3-5  Clinical Impression  Pt admitted with/for generalized weakness and unsteady gait.  Pt needing significant assist at this time and is far from baseline function.  Pt currently limited functionally due to the problems listed. ( See problems list.)   Pt will benefit from PT to maximize function and safety in order to get ready for next venue listed below.     Follow Up Recommendations CIR    Equipment Recommendations  (TBA next venue)    Recommendations for Other Services Rehab consult     Precautions / Restrictions Precautions Precautions: Fall      Mobility  Bed Mobility Overal bed mobility: Needs Assistance Bed Mobility: Rolling;Sidelying to Sit;Sit to Sidelying Rolling: Mod assist Sidelying to sit: Max assist;+2 for safety/equipment;+2 for physical assistance     Sit to sidelying: Max assist General bed mobility comments: assist to roll and to lift trunk.  Assist to lift LEs onto bed   Transfers Overall transfer level: Needs assistance Equipment used: 4-wheeled walker Transfers: Sit to/from Stand Sit to Stand: Max assist;+2 physical assistance         General transfer comment: Pt required assist to boost into standing, as well as assist to maintain standing.  Pt loses attention, and drift to the Rt, posteriorly and flexes hips and knees .  Pt need repetitive cues and assist to come forward to complete 4 trial of scoot  transfer up toward [facility].  Ambulation/Gait             General Gait Details: Not tested  Stairs            Wheelchair Mobility    Modified Rankin (Stroke Patients Only)       Balance Overall balance assessment: Needs assistance Sitting-balance support: Feet supported;Bilateral upper extremity supported Sitting balance-Leahy Scale: Poor Sitting balance - Comments: Pt initially with heavy posteriorl lean requiring mod A to maintain EOB sitting.   He progressed to min A  Postural control: Posterior lean Standing balance support: Bilateral upper extremity supported Standing balance-Leahy Scale: Poor Standing balance comment: requires Bil. UE support and max A +2 to maintain static standing.                               Pertinent Vitals/Pain Pain Assessment: No/denies pain    Home Living Family/patient expects to be discharged to:: Private residence Living Arrangements: Spouse/significant other Available Help at Discharge: Family;Available PRN/intermittently Type of Home: House Home Access: Stairs to enter Entrance Stairs-Rails: None Entrance Stairs-Number of Steps: 3 Home Layout: One level Home Equipment: Walker - 4 wheels;Cane - single point;Shower seat - built in;Grab bars - tub/shower;Grab bars - toilet      Prior Function Level of Independence: Independent with assistive device(s)         Comments: 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  He was mod I with ADLs, uses rollator, has h/o  falls due to LE weakness.  Drives, and is a Statistician   Dominant Hand: Right    Extremity/Trunk Assessment   Upper Extremity Assessment Upper Extremity Assessment: Overall WFL for tasks assessed    Lower Extremity Assessment Lower Extremity Assessment: Generalized weakness(quad weakness at 4-/5, right weaker than left)       Communication   Communication: Other (comment)(minimal output )  Cognition  Arousal/Alertness: Lethargic Behavior During Therapy: Flat affect;Impulsive Overall Cognitive Status: Impaired/Different from baseline Area of Impairment: Orientation;Attention;Memory;Following commands;Safety/judgement;Problem solving;Awareness                 Orientation Level: Disoriented to;Time Current Attention Level: Sustained;Focused Memory: Decreased short-term memory Following Commands: Follows one step commands with increased time Safety/Judgement: Decreased awareness of safety;Decreased awareness of deficits   Problem Solving: Slow processing;Decreased initiation;Difficulty sequencing;Requires verbal cues;Requires tactile cues General Comments: Pt very slow to respond with significant delay in processing.  Requires mod - max cues for problem solving and sequencing       General Comments General comments (skin integrity, edema, etc.): vss    Exercises     Assessment/Plan    PT Assessment Patient needs continued PT services  PT Problem List Decreased strength;Decreased balance;Decreased activity tolerance;Decreased mobility;Decreased coordination;Decreased knowledge of use of DME       PT Treatment Interventions Gait training;DME instruction;Functional mobility training;Therapeutic activities;Balance training;Patient/family education    PT Goals (Current goals can be found in the Care Plan section)  Acute Rehab PT Goals Patient Stated Goal: To eat lunch  PT Goal Formulation: With patient Time For Goal Achievement: 12/08/17 Potential to Achieve Goals: Good    Frequency Min 4X/week   Barriers to discharge        Co-evaluation PT/OT/SLP Co-Evaluation/Treatment: Yes Reason for Co-Treatment: Complexity of the patient's impairments (multi-system involvement) PT goals addressed during session: Mobility/safety with mobility OT goals addressed during session: ADL's and self-care       AM-PAC PT "6 Clicks" Daily Activity  Outcome Measure Difficulty turning  over in bed (including adjusting bedclothes, sheets and blankets)?: Unable Difficulty moving from lying on back to sitting on the side of the bed? : Unable Difficulty sitting down on and standing up from a chair with arms (e.g., wheelchair, bedside commode, etc,.)?: Unable Help needed moving to and from a bed to chair (including a wheelchair)?: A Lot Help needed walking in hospital room?: A Lot Help needed climbing 3-5 steps with a railing? : Total 6 Click Score: 8    End of Session   Activity Tolerance: Patient tolerated treatment well;Patient limited by fatigue   Nurse Communication: Mobility status PT Visit Diagnosis: Unsteadiness on feet (R26.81);Other symptoms and signs involving the nervous system (R29.898);Difficulty in walking, not elsewhere classified (R26.2)    Time: 1517-6160 PT Time Calculation (min) (ACUTE ONLY): 60 min   Charges:   PT Evaluation $PT Eval Moderate Complexity: 1 Mod PT Treatments $Therapeutic Activity: 8-22 mins   PT G Codes:        09-Dec-2017  Donnella Sham, PT [phone]  (pager)  Tessie Fass Alaiyah Bollman 2017-12-09, 7:50 PM

## 2017-11-24 NOTE — Progress Notes (Signed)
Pharmacy Antibiotic Note  Ronald Rice is a 72 y.o. male admitted on 11/22/2017 as a code stroke s/p tPA.  Pharmacy has been consulted for Zosyn dosing for new fevers and worsening leukocytosis. SCr wnl.   Plan: -Zosyn 3.375 gm IV Q 8 hours (EI infusion) -Monitor CBC, renal fx, cultures and clinical progress   Height: 6\' 2"  (188 cm) Weight: 276 lb 14.4 oz (125.6 kg) IBW/kg (Calculated) : 82.2  Temp (24hrs), Avg:101 F (38.3 C), Min:99.1 F (37.3 C), Max:103.2 F (39.6 C)  Recent Labs  Lab 11/22/17 1844 11/22/17 1858 11/23/17 0719 11/24/17 0400 11/24/17 0916  WBC 11.4*  --  15.8* 16.8*  --   CREATININE 1.31* 1.20 1.31* 1.07  --   LATICACIDVEN  --   --   --   --  1.1    Estimated Creatinine Clearance: 91.8 mL/min (by C-G formula based on SCr of 1.07 mg/dL).    No Known Allergies  Antimicrobials this admission: Zosyn 7/4 >>   Dose adjustments this admission:   Microbiology results: 7/4 BCx:  7/4 MRSA PCR:   Thank you for allowing pharmacy to be a part of this patient's care.  Albertina Parr, PharmD., BCPS Clinical Pharmacist Clinical phone for 11/24/17 until 3:30pm: [phone] If after 3:30pm, please refer to [facility] for unit-specific pharmacist

## 2017-11-24 NOTE — Progress Notes (Addendum)
STROKE TEAM PROGRESS NOTE   SUBJECTIVE (INTERVAL HISTORY) His wife is at the bedside. Pt is awake alert, but mildly lethargic. Overnight, continued to have tachycardia and tachypnea.  Also developed high-grade fever, T-max 103.2, this morning still 102.3.  On cooling blanket and ice packs.  Stroke work-up all negative.  Denies nausea vomiting, abdominal pain.  However, UA showed WBC more than 50, RBC 21-50.  Leukocytosis worsening WBC from 15.8-16.8.  As per wife and patient, patient had a history of right large kidney cyst, had discussion previously by his doctor for cyst drainage, but never had procedures.   OBJECTIVE Temp:  [99.1 F (37.3 C)-103.2 F (39.6 C)] 102.3 F (39.1 C) (07/04 0800) Pulse Rate:  [97-127] 110 (07/04 1000) Cardiac Rhythm: Sinus tachycardia (07/04 0800) Resp:  [0-34] 30 (07/04 1000) BP: (91-152)/(51-94) 152/93 (07/04 1000) SpO2:  [90 %-96 %] 96 % (07/04 1000)  Recent Labs  Lab 11/23/17 1129 11/23/17 1639 11/23/17 2016 11/24/17 0436 11/24/17 0744  GLUCAP 195* 194* 167* 149* 164*   Recent Labs  Lab 11/22/17 1844 11/22/17 1858 11/23/17 0719 11/24/17 0400  NA 141 140 140 137  K 3.3* 3.2* 3.5 3.8  CL 103 101 102 100  CO2 24  --  28 28  GLUCOSE 184* 180* 167* 146*  BUN 12 13 13 13   CREATININE 1.31* 1.20 1.31* 1.07  CALCIUM 9.5  --  9.0 8.7*   Recent Labs  Lab 11/22/17 1844  AST 26  ALT 23  ALKPHOS 59  BILITOT 1.1  PROT 7.2  ALBUMIN 4.1   Recent Labs  Lab 11/22/17 1844 11/22/17 1858 11/23/17 0719 11/24/17 0400  WBC 11.4*  --  15.8* 16.8*  NEUTROABS 9.1*  --   --  13.6*  HGB 14.9 14.6 14.7 15.1  HCT 41.9 43.0 41.1 42.8  MCV 87.3  --  87.8 88.4  PLT 190  --  180 153   No results for input(s): CKTOTAL, CKMB, CKMBINDEX, TROPONINI in the last 168 hours. Recent Labs    11/22/17 1844  LABPROT 13.5  INR 1.04   Recent Labs    11/22/17 1933 11/24/17 0732  COLORURINE YELLOW YELLOW  LABSPEC 1.017 1.018  PHURINE 6.0 5.0  GLUCOSEU  NEGATIVE NEGATIVE  HGBUR NEGATIVE LARGE*  BILIRUBINUR NEGATIVE NEGATIVE  KETONESUR 20* 20*  PROTEINUR NEGATIVE NEGATIVE  NITRITE NEGATIVE NEGATIVE  LEUKOCYTESUR TRACE* MODERATE*       Component Value Date/Time   CHOL 90 11/23/2017 0227   TRIG 72 11/23/2017 0227   HDL 27 (L) 11/23/2017 0227   CHOLHDL 3.3 11/23/2017 0227   VLDL 14 11/23/2017 0227   LDLCALC 49 11/23/2017 0227   Lab Results  Component Value Date   HGBA1C 6.7 (H) 11/23/2017      Component Value Date/Time   LABOPIA NONE DETECTED 11/22/2017 1931   COCAINSCRNUR NONE DETECTED 11/22/2017 1931   LABBENZ NONE DETECTED 11/22/2017 1931   AMPHETMU NONE DETECTED 11/22/2017 1931   THCU NONE DETECTED 11/22/2017 1931   LABBARB (A) 11/22/2017 1931    Result not available. Reagent lot number recalled by manufacturer.    No results for input(s): ETH in the last 168 hours.  I have personally reviewed the radiological images below and agree with the radiology interpretations.  Ct Angio Head W Or Wo Contrast  Result Date: 11/22/2017 CLINICAL DATA:  Initial evaluation for acute dizziness, confusion. EXAM: CT ANGIOGRAPHY HEAD AND NECK TECHNIQUE: Multidetector CT imaging of the head and neck was performed using the standard protocol during bolus  administration of intravenous contrast. Multiplanar CT image reconstructions and MIPs were obtained to evaluate the vascular anatomy. Carotid stenosis measurements (when applicable) are obtained utilizing NASCET criteria, using the distal internal carotid diameter as the denominator. CONTRAST:  70mL ISOVUE-370 IOPAMIDOL (ISOVUE-370) INJECTION 76% COMPARISON:  Prior CT and MRI from earlier the same day. FINDINGS: CTA NECK FINDINGS Aortic arch: Visualized aortic arch of normal caliber with normal 3 vessel morphology. No flow-limiting stenosis about the origin of the great vessels. Visualized subclavian arteries widely patent. Right carotid system: Right common and internal carotid arteries are widely  patent without stenosis, dissection, or occlusion. No atheromatous narrowing about the right carotid bifurcation. Left carotid system: Left common and internal carotid arteries widely patent without stenosis, dissection, or occlusion. No atheromatous narrowing about the left carotid bifurcation. Vertebral arteries: Both of the vertebral arteries arise from the subclavian arteries. Vertebral arteries patent within the neck without stenosis, dissection, or occlusion. Evaluation the vertebral arteries mildly limited at the C3 through C5 levels due to adjacent ACDF. Skeleton: No acute osseous abnormality. No worrisome lytic or blastic osseous lesions. Patient is status post prior cervical fusion at C3 through C7. Other neck: No acute soft tissue abnormality within the neck. 17 mm hypodense right thyroid nodule, indeterminate. Upper chest: Visualized upper chest demonstrates no acute finding. Scattered atelectatic changes noted dependently within the visualized lungs. Visualized lungs are otherwise clear. Review of the MIP images confirms the above findings CTA HEAD FINDINGS Anterior circulation: Internal carotid arteries patent to the termini without stenosis. Minimal plaque within the cavernous ICAs bilaterally. A1 segments patent bilaterally. Normal anterior communicating artery. Azygos ACA, patent to its distal aspect. M1 segments patent without stenosis or occlusion. No proximal M2 occlusion. Distal MCA branches well perfused and symmetric. Posterior circulation: Vertebral arteries patent to the vertebrobasilar junction without stenosis. Left vertebral artery slightly dominant. Patent left PICA. Right PICA not well visualized. Basilar widely patent to its distal aspect. Superior cerebellar and posterior cerebral arteries widely patent bilaterally. Venous sinuses: Patent. Anatomic variants: Azygos ACA.  No intracranial aneurysm. Delayed phase: No abnormal enhancement. Review of the MIP images confirms the above  findings IMPRESSION: 1. Negative CTA with no evidence for large vessel occlusion. No hemodynamically significant or correctable stenosis identified. 2. Azygos ACA. 3. 1.7 cm right thyroid nodule, indeterminate. Follow-up examination with dedicated thyroid ultrasound recommended for further characterization. Electronically Signed   By: Jeannine Boga M.D.   On: 11/22/2017 22:14   Ct Head Wo Contrast  Result Date: 11/23/2017 CLINICAL DATA:  24 hour tPA follow-up EXAM: CT HEAD WITHOUT CONTRAST TECHNIQUE: Contiguous axial images were obtained from the base of the skull through the vertex without intravenous contrast. COMPARISON:  Head CT 11/22/2017 FINDINGS: Brain: There is no mass, hemorrhage or extra-axial collection. The size and configuration of the ventricles and extra-axial CSF spaces are normal. There is no acute or chronic infarction. The brain parenchyma is normal. Vascular: No abnormal hyperdensity of the major intracranial arteries or dural venous sinuses. No intracranial atherosclerosis. Skull: The visualized skull base, calvarium and extracranial soft tissues are normal. Sinuses/Orbits: No fluid levels or advanced mucosal thickening of the visualized paranasal sinuses. No mastoid or middle ear effusion. The orbits are normal. IMPRESSION: No intracranial hemorrhage. Electronically Signed   By: Ulyses Jarred M.D.   On: 11/23/2017 20:12   Ct Angio Neck W And/or Wo Contrast  Result Date: 11/22/2017 CLINICAL DATA:  Initial evaluation for acute dizziness, confusion. EXAM: CT ANGIOGRAPHY HEAD AND NECK TECHNIQUE: Multidetector CT  imaging of the head and neck was performed using the standard protocol during bolus administration of intravenous contrast. Multiplanar CT image reconstructions and MIPs were obtained to evaluate the vascular anatomy. Carotid stenosis measurements (when applicable) are obtained utilizing NASCET criteria, using the distal internal carotid diameter as the denominator. CONTRAST:   39mL ISOVUE-370 IOPAMIDOL (ISOVUE-370) INJECTION 76% COMPARISON:  Prior CT and MRI from earlier the same day. FINDINGS: CTA NECK FINDINGS Aortic arch: Visualized aortic arch of normal caliber with normal 3 vessel morphology. No flow-limiting stenosis about the origin of the great vessels. Visualized subclavian arteries widely patent. Right carotid system: Right common and internal carotid arteries are widely patent without stenosis, dissection, or occlusion. No atheromatous narrowing about the right carotid bifurcation. Left carotid system: Left common and internal carotid arteries widely patent without stenosis, dissection, or occlusion. No atheromatous narrowing about the left carotid bifurcation. Vertebral arteries: Both of the vertebral arteries arise from the subclavian arteries. Vertebral arteries patent within the neck without stenosis, dissection, or occlusion. Evaluation the vertebral arteries mildly limited at the C3 through C5 levels due to adjacent ACDF. Skeleton: No acute osseous abnormality. No worrisome lytic or blastic osseous lesions. Patient is status post prior cervical fusion at C3 through C7. Other neck: No acute soft tissue abnormality within the neck. 17 mm hypodense right thyroid nodule, indeterminate. Upper chest: Visualized upper chest demonstrates no acute finding. Scattered atelectatic changes noted dependently within the visualized lungs. Visualized lungs are otherwise clear. Review of the MIP images confirms the above findings CTA HEAD FINDINGS Anterior circulation: Internal carotid arteries patent to the termini without stenosis. Minimal plaque within the cavernous ICAs bilaterally. A1 segments patent bilaterally. Normal anterior communicating artery. Azygos ACA, patent to its distal aspect. M1 segments patent without stenosis or occlusion. No proximal M2 occlusion. Distal MCA branches well perfused and symmetric. Posterior circulation: Vertebral arteries patent to the vertebrobasilar  junction without stenosis. Left vertebral artery slightly dominant. Patent left PICA. Right PICA not well visualized. Basilar widely patent to its distal aspect. Superior cerebellar and posterior cerebral arteries widely patent bilaterally. Venous sinuses: Patent. Anatomic variants: Azygos ACA.  No intracranial aneurysm. Delayed phase: No abnormal enhancement. Review of the MIP images confirms the above findings IMPRESSION: 1. Negative CTA with no evidence for large vessel occlusion. No hemodynamically significant or correctable stenosis identified. 2. Azygos ACA. 3. 1.7 cm right thyroid nodule, indeterminate. Follow-up examination with dedicated thyroid ultrasound recommended for further characterization. Electronically Signed   By: Jeannine Boga M.D.   On: 11/22/2017 22:14   Mr Brain Wo Contrast  Result Date: 11/22/2017 CLINICAL DATA:  Ataxia.  Difficulty speaking.  Given tPA. EXAM: MRI HEAD WITHOUT CONTRAST TECHNIQUE: Multiplanar, multiecho pulse sequences of the brain and surrounding structures were obtained without intravenous contrast. COMPARISON:  Head CT 11/22/2017 FINDINGS: An abbreviated protocol was performed at the direction of the attending stroke neurologist. Axial and coronal diffusion, sagittal T1, and axial FLAIR sequences were obtained Brain: There is no evidence of acute infarct, intracranial hemorrhage, mass, midline shift, or extra-axial fluid collection. Mild generalized cerebral atrophy is within normal limits for age. No significant cerebral white matter disease is seen. Vascular: Limited assessment on this abbreviated MRI. Skull and upper cervical spine: No suspicious marrow lesion. Prior anterior cervical fusion. Sinuses/Orbits: No acute findings. Other: None. IMPRESSION: Abbreviated brain MRI without evidence of acute infarct or other significant intracranial abnormality. Electronically Signed   By: Logan Bores M.D.   On: 11/22/2017 20:47   Dg [facility]  1 View  Result Date:  11/24/2017 CLINICAL DATA:  Fevers EXAM: PORTABLE CHEST 1 VIEW COMPARISON:  04/18/2017 FINDINGS: Cardiac shadow is within normal limits. Lungs are well aerated bilaterally but hypoinflated. No focal infiltrate or sizable effusion is seen. No bony abnormality is noted. IMPRESSION: No active disease. Electronically Signed   By: Inez Catalina M.D.   On: 11/24/2017 07:50   Ct Head Code Stroke Wo Contrast  Result Date: 11/22/2017 CLINICAL DATA:  Code stroke. Dizziness, confusion, nausea, and vomiting. EXAM: CT HEAD WITHOUT CONTRAST TECHNIQUE: Contiguous axial images were obtained from the base of the skull through the vertex without intravenous contrast. COMPARISON:  05/22/2016 FINDINGS: Brain: There is no evidence of acute infarct, intracranial hemorrhage, mass, midline shift, or extra-axial fluid collection. Mild generalized cerebral atrophy is not greater than expected for age. Vascular: Calcified atherosclerosis at the skull base. No hyperdense vessel. Skull: No fracture or focal osseous lesion. Sinuses/Orbits: Visualized paranasal sinuses and mastoid air cells are clear. Orbits are unremarkable. Other: Numerous dermal calcifications at the vertex. ASPECTS [facility] Stroke Program Early CT Score) Not scored with this history. IMPRESSION: No evidence of acute intracranial abnormality. These results were communicated to Dr. Lorraine Lax at 7:15 pm on 11/22/2017 by text page via the [facility] messaging system. Electronically Signed   By: Logan Bores M.D.   On: 11/22/2017 19:15    TTE  - Left ventricle: The cavity size was normal. There was moderate   focal basal hypertrophy of the septum. Systolic function was   vigorous. The estimated ejection fraction was in the range of 65%   to 70%. Doppler parameters are consistent with abnormal left   ventricular relaxation (grade 1 diastolic dysfunction). There was   no evidence of elevated ventricular filling pressure by Doppler   parameters. - Aortic valve: Valve area (VTI): 2.6  cm^2. Valve area (Vmean):   2.44 cm^2. - Mitral valve: There was no regurgitation. - Right ventricle: The cavity size was normal. Wall thickness was   normal. Systolic function was normal. - Right atrium: The atrium was normal in size. - Tricuspid valve: There was trivial regurgitation. - Pulmonary arteries: Systolic pressure was within the normal   range. - Pericardium, extracardiac: The pericardium was normal in   appearance. Impressions: - No cardiac source of emboli was indentified.   PHYSICAL EXAM  Temp:  [99.1 F (37.3 C)-103.2 F (39.6 C)] 102.3 F (39.1 C) (07/04 0800) Pulse Rate:  [97-127] 110 (07/04 1000) Resp:  [0-34] 30 (07/04 1000) BP: (91-152)/(51-94) 152/93 (07/04 1000) SpO2:  [90 %-96 %] 96 % (07/04 1000)  General - Well nourished, well developed, in no apparent distress.  Ophthalmologic - fundi not visualized due to noncooperation.  Cardiovascular - Regular rhythm, but tachycardia.  Abdomen -soft, nontender, bowel sounds present, no flank tenderness at costovertebral angles.  Mental Status -  Level of arousal and orientation to month, place, and person were intact, but not to year. Language including expression, naming, repetition, comprehension was assessed and found intact.  Cranial Nerves II - XII - II - Visual field intact OU. III, IV, VI - Extraocular movements intact. V - Facial sensation intact bilaterally. VII - Facial movement intact bilaterally. VIII - Hearing & vestibular intact bilaterally. X - Palate elevates symmetrically. XI - Chin turning & shoulder shrug intact bilaterally. XII - Tongue protrusion intact.  Motor Strength - The patient's strength was symmetrical in all extremities and pronator drift was absent.  Bulk was normal and fasciculations were absent.   Motor  Tone - Muscle tone was assessed at the neck and appendages and was normal.  Reflexes - The patient's reflexes were symmetrical in all extremities and he had no  pathological reflexes.  Sensory - Light touch, temperature/pinprick were assessed and were symmetrical.    Coordination - The patient had normal movements in the hands with no ataxia or dysmetria.  Tremor was absent.  Gait and Station - deferred.   ASSESSMENT/PLAN Mr. Bronson Bressman is a 72 y.o. male with history of CAD, DM, HTN, CLL, cervical myelopathy s/p decompression in 03/2017 currently walk with walker admitted for vomiting, generalized weakness. tPA given.    Sepsis - fever, tachycardia, tachypnea, leukocytosis, source unclear  MRI  No acute infarct  CTA head and neck unremarkable  2D Echo EF 65 to 70%  LDL 49  HgbA1c 6.7  SCDs for VTE prophylaxis  aspirin 81 mg daily prior to admission, now on No antithrombotic s/p tPA within 24h  Patient counseled to be compliant with his antithrombotic medications  Ongoing aggressive stroke risk factor management  Therapy recommendations:  Pending   Disposition:  Pending   ?? UTI vs. Pyelonephritis  History of right large kidney cyst as per patient and wife  UA WBC > 50 and RBC 21-50  Urine culture pending  May consider CT abdomen pelvis  CCM on board  Fever and leukocytosis  T-max 103.2  WBC 11.4-> 15.8-> 16.8  Infection source unclear  Blood culture pending  Lactic acid normal  CCM on board  Diabetes  HgbA1c 6.7 goal < 7.0  Controlled  CBG monitoring  SSI  Hypertension Stable Resumed atenolol and amlodipine  Long term BP goal normotensive  Hyperlipidemia  Home meds:  lipitor 40  LDL 49, goal < 70  Now on home lipitor  Continue statin at discharge  Other Stroke Risk Factors  Advanced age  ETOH use  Obesity, Body mass index is 35.55 kg/m.   Coronary artery disease  Other Active Problems  Hypokalemia - supplement, resolved  Hospital day # 2  This patient is critically ill due to sepsis,, high-grade fever, tachycardia, leukocytosis and at significant risk of neurological  worsening, death form septic shock, heart failure, seizure. This patient's care requires constant monitoring of vital signs, hemodynamics, respiratory and cardiac monitoring, review of multiple databases, neurological assessment, discussion with family, other specialists and medical decision making of high complexity. I spent 35 minutes of neurocritical care time in the care of this patient.  Rosalin Hawking, MD PhD Stroke Neurology 11/24/2017 10:09 AM    To contact Stroke Continuity provider, please refer to [email or web address] After hours, contact General Neurology

## 2017-11-24 NOTE — Evaluation (Signed)
Occupational Therapy Evaluation Patient Details Name: Ronald Rice MRN: [id number] DOB: [birth year] Today's Date: 11/24/2017    History of Present Illness This 72 y.o. male admitted with generalized weakness and unsteady gait.  Head CT negative for acute infarct, and he received tPA due to presumed ischemic CVA.  MRI with no acute infarct.  He has remained confused and developed fever, tachycardia, and tachypneic.   Dx:  Likely sepsis due to UTI, acute encephalopathy.  PMH includes:  CLL, CAD, DM, GERD, HTN, recent bladder outlet obstruction., c/p ACDF C3-5   Clinical Impression   Pt admitted with above. He demonstrates the below listed deficits and will benefit from continued OT to maximize safety and independence with BADLs.  Pt presents to OT with generalized weakness, decreased balance, decreased activity tolerance, cognitive impairments including deficits with orientation, attention, problem solving, sequencing, and memory, as well as delayed processing.  He requires mod - total A for ADLs and max A +2 for functional mobility.   He lives with his wife, and was mod I with ADLs.  He ambulated with rollator, and has h/o falls due to LE weakness due previous "back issues".  He lives with wife who is very supportive.  Feel he will benefit from CIR to allow him to maximize safety and independence with ADLs and reduce risk of falls.       Follow Up Recommendations  CIR;Supervision/Assistance - 24 hour    Equipment Recommendations  3 in 1 bedside commode    Recommendations for Other Services Rehab consult     Precautions / Restrictions Precautions Precautions: Fall      Mobility Bed Mobility Overal bed mobility: Needs Assistance Bed Mobility: Rolling;Sidelying to Sit;Sit to Sidelying Rolling: Mod assist Sidelying to sit: Max assist;+2 for safety/equipment;+2 for physical assistance     Sit to sidelying: Max assist General bed mobility comments: assist to roll and to lift trunk.  Assist  to lift LEs onto bed   Transfers Overall transfer level: Needs assistance Equipment used: 4-wheeled walker Transfers: Sit to/from Stand Sit to Stand: Max assist;+2 physical assistance         General transfer comment: Pt required assist to boost into standing, as well as assist to maintain standing.  Pt loses attention, and drift to the Rt, posteriorly and flexes hips and knees     Balance Overall balance assessment: Needs assistance Sitting-balance support: Feet supported;Bilateral upper extremity supported Sitting balance-Leahy Scale: Poor Sitting balance - Comments: Pt initially with heavy posteriorl lean requiring mod A to maintain EOB sitting.   He progressed to min A  Postural control: Posterior lean Standing balance support: Bilateral upper extremity supported Standing balance-Leahy Scale: Poor Standing balance comment: requires Bil. UE support and max A +2 to maintain static standing.                             ADL either performed or assessed with clinical judgement   ADL Overall ADL's : Needs assistance/impaired Eating/Feeding: Minimal assistance;Bed level   Grooming: Wash/dry hands;Wash/dry face;Brushing hair;Moderate assistance;Sitting(EOB)   Upper Body Bathing: Maximal assistance;Bed level;Sitting   Lower Body Bathing: Total assistance;Sit to/from stand;Bed level   Upper Body Dressing : Maximal assistance;Sitting;Bed level   Lower Body Dressing: Total assistance;Sit to/from stand Lower Body Dressing Details (indicate cue type and reason): pt unable to cross ankles over knees which is the technique he previously utilized  Armed forces technical officer: Maximal assistance;+2 for physical assistance;Stand-pivot;BSC   Toileting- Clothing  Manipulation and Hygiene: Total assistance;Sit to/from stand Toileting - Clothing Manipulation Details (indicate cue type and reason): Pt incontinent of urine multiple times without awareness      Functional mobility during ADLs:  Maximal assistance;+2 for physical assistance       Vision   Additional Comments: did not assess.  Will benefit from assessment      Perception Perception Perception Tested?: Yes   Praxis Praxis Praxis tested?: Deficits Deficits: Initiation    Pertinent Vitals/Pain Pain Assessment: No/denies pain     Hand Dominance Right   Extremity/Trunk Assessment Upper Extremity Assessment Upper Extremity Assessment: Overall WFL for tasks assessed   Lower Extremity Assessment Lower Extremity Assessment: Defer to PT evaluation       Communication Communication Communication: Other (comment)(minimal output )   Cognition Arousal/Alertness: Lethargic Behavior During Therapy: Flat affect;Impulsive Overall Cognitive Status: Impaired/Different from baseline Area of Impairment: Orientation;Attention;Memory;Following commands;Safety/judgement;Problem solving;Awareness                 Orientation Level: Disoriented to;Time Current Attention Level: Sustained;Focused Memory: Decreased short-term memory Following Commands: Follows one step commands with increased time Safety/Judgement: Decreased awareness of safety;Decreased awareness of deficits   Problem Solving: Slow processing;Decreased initiation;Difficulty sequencing;Requires verbal cues;Requires tactile cues General Comments: Pt very slow to respond with significant delay in processing.  Requires mod - max cues for problem solving and sequencing    General Comments  VSS.      Exercises     Shoulder Instructions      Home Living Family/patient expects to be discharged to:: Private residence Living Arrangements: Spouse/significant other Available Help at Discharge: Family;Available PRN/intermittently Type of Home: House Home Access: Stairs to enter [employer] of Steps: 3 Entrance Stairs-Rails: None Home Layout: One level Alternate Level Stairs-Number of Steps: 13   Bathroom Shower/Tub: Medical illustrator: Handicapped height     Home Equipment: Environmental consultant - 4 wheels;Cane - single point;Shower seat - built in;Grab bars - tub/shower;Grab bars - toilet          Prior Functioning/Environment Level of Independence: Independent with assistive device(s)        Comments: 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  He was mod I with ADLs, uses rollator, has h/o falls due to LE weakness.  Drives, and is a Careers adviser Problem List: Decreased strength;Decreased activity tolerance;Impaired balance (sitting and/or standing);Decreased cognition;Decreased safety awareness;Impaired vision/perception;Decreased knowledge of use of DME or AE      OT Treatment/Interventions: Self-care/ADL training;Therapeutic exercise;Neuromuscular education;DME and/or AE instruction;Therapeutic activities;Cognitive remediation/compensation;Visual/perceptual remediation/compensation;Patient/family education;Balance training    OT Goals(Current goals can be found in the care plan section) Acute Rehab OT Goals Patient Stated Goal: To eat lunch  OT Goal Formulation: With patient/family Time For Goal Achievement: 12/08/17 Potential to Achieve Goals: Good ADL Goals Pt Will Perform Eating: Independently;sitting Pt Will Perform Grooming: with min assist;standing Pt Will Perform Upper Body Bathing: with set-up;sitting Pt Will Perform Lower Body Bathing: with min assist;sit to/from stand Pt Will Perform Upper Body Dressing: with set-up;sitting Pt Will Perform Lower Body Dressing: with min assist;sit to/from stand Pt Will Transfer to Toilet: with min assist;ambulating;regular height toilet;bedside commode;grab bars Pt Will Perform Toileting - Clothing Manipulation and hygiene: with min assist;sit to/from stand Additional ADL Goal #1: Pt will selectively attend to ADLs tasks with no cues  OT Frequency: Min 2X/week   Barriers to D/C:  Co-evaluation PT/OT/SLP  Co-Evaluation/Treatment: Yes Reason for Co-Treatment: Complexity of the patient's impairments (multi-system involvement);Necessary to address cognition/behavior during functional activity;For patient/therapist safety;To address functional/ADL transfers PT goals addressed during session: Mobility/safety with mobility;Balance OT goals addressed during session: ADL's and self-care;Strengthening/ROM      AM-PAC PT "6 Clicks" Daily Activity     Outcome Measure Help from another person eating meals?: A Little Help from another person taking care of personal grooming?: A Little Help from another person toileting, which includes using toliet, bedpan, or urinal?: A Lot Help from another person bathing (including washing, rinsing, drying)?: A Lot Help from another person to put on and taking off regular upper body clothing?: A Lot Help from another person to put on and taking off regular lower body clothing?: Total 6 Click Score: 13   End of Session Equipment Utilized During Treatment: Rolling walker;Gait belt Nurse Communication: Mobility status  Activity Tolerance: Patient limited by fatigue Patient left: in bed;with call bell/phone within reach;with family/visitor present;with nursing/sitter in room  OT Visit Diagnosis: Unsteadiness on feet (R26.81);Cognitive communication deficit (R41.841) Symptoms and signs involving cognitive functions: Cerebral infarction                Time: 2956-2130 OT Time Calculation (min): 57 min Charges:  OT General Charges $OT Visit: 1 Visit OT Evaluation $OT Eval Moderate Complexity: 1 Mod OT Treatments $Self Care/Home Management : 8-22 mins G-Codes:     [facility], OTR/L [phone]   Lucille Passy M 11/24/2017, 5:42 PM

## 2017-11-24 NOTE — Progress Notes (Signed)
Rehab Admissions Coordinator Note:  Patient was screened by Cleatrice Burke for appropriateness for an Inpatient Acute Rehab Consult per PT and OT recommendations..  At this time, we are recommending Inpatient Rehab consult. Please place order if you would like pt to be considered for admit.   Cleatrice Burke 11/24/2017, 8:25 PM  I can be reached at [phone].

## 2017-11-25 ENCOUNTER — Inpatient Hospital Stay (HOSPITAL_COMMUNITY): Payer: Medicare Other

## 2017-11-25 DIAGNOSIS — N3001 Acute cystitis with hematuria: Secondary | ICD-10-CM

## 2017-11-25 DIAGNOSIS — R509 Fever, unspecified: Secondary | ICD-10-CM

## 2017-11-25 DIAGNOSIS — A4152 Sepsis due to Pseudomonas: Secondary | ICD-10-CM

## 2017-11-25 LAB — BLOOD CULTURE ID PANEL (REFLEXED)

## 2017-11-25 LAB — GLUCOSE, CAPILLARY
Glucose-Capillary: 226 mg/dL — ABNORMAL HIGH (ref 70–99)
Glucose-Capillary: 168 mg/dL — ABNORMAL HIGH (ref 70–99)
Glucose-Capillary: 143 mg/dL — ABNORMAL HIGH (ref 70–99)
Glucose-Capillary: 193 mg/dL — ABNORMAL HIGH (ref 70–99)

## 2017-11-25 LAB — CBC WITH DIFFERENTIAL/PLATELET
Abs Immature Granulocytes: 0.1 10*3/uL (ref 0.0–0.1)
Basophils Absolute: 0 10*3/uL (ref 0.0–0.1)
Basophils Relative: 0 %
Eosinophils Absolute: 0.1 10*3/uL (ref 0.0–0.7)
Eosinophils Relative: 1 %
HCT: 35.4 % — ABNORMAL LOW (ref 39.0–52.0)
Hemoglobin: 12.3 g/dL — ABNORMAL LOW (ref 13.0–17.0)
Immature Granulocytes: 1 %
Lymphocytes Relative: 7 %
Lymphs Abs: 0.9 10*3/uL (ref 0.7–4.0)
MCH: 30.8 pg (ref 26.0–34.0)
MCHC: 34.7 g/dL (ref 30.0–36.0)
MCV: 88.5 fL (ref 78.0–100.0)
Monocytes Absolute: 1.1 10*3/uL — ABNORMAL HIGH (ref 0.1–1.0)
Monocytes Relative: 9 %
Neutro Abs: 10.4 10*3/uL — ABNORMAL HIGH (ref 1.7–7.7)
Neutrophils Relative %: 82 %
Platelets: 122 10*3/uL — ABNORMAL LOW (ref 150–400)
RBC: 4 MIL/uL — ABNORMAL LOW (ref 4.22–5.81)
RDW: 12.1 % (ref 11.5–15.5)
WBC: 12.5 10*3/uL — ABNORMAL HIGH (ref 4.0–10.5)

## 2017-11-25 LAB — BASIC METABOLIC PANEL
Anion gap: 8 (ref 5–15)
BUN: 12 mg/dL (ref 8–23)
CO2: 29 mmol/L (ref 22–32)
Calcium: 8.1 mg/dL — ABNORMAL LOW (ref 8.9–10.3)
Chloride: 100 mmol/L (ref 98–111)
Creatinine, Ser: 1.25 mg/dL — ABNORMAL HIGH (ref 0.61–1.24)
GFR calc Af Amer: >60 mL/min (ref >60)
GFR calc non Af Amer: 57 mL/min — ABNORMAL LOW (ref >60)
Glucose, Bld: 149 mg/dL — ABNORMAL HIGH (ref 70–99)
Potassium: 3.6 mmol/L (ref 3.5–5.1)
Sodium: 137 mmol/L (ref 135–145)

## 2017-11-25 MED ORDER — ENOXAPARIN SODIUM 40 MG/0.4ML ~~LOC~~ SOLN
40.0000 mg | SUBCUTANEOUS | Status: DC
  Administered 2017-11-25 – 2017-11-26 (×2): 40 mg via SUBCUTANEOUS
  Filled 2017-11-25 (×2): qty 0.4

## 2017-11-25 MED ORDER — BENZONATATE 100 MG PO CAPS
100.0000 mg | ORAL_CAPSULE | Freq: Two times a day (BID) | ORAL | Status: DC | PRN
  Administered 2017-11-25 (×2): 100 mg via ORAL
  Filled 2017-11-25 (×2): qty 1

## 2017-11-25 NOTE — Progress Notes (Signed)
Physical Therapy Treatment Patient Details Name: Ronald Rice MRN: [id number] DOB: [birth year] Today's Date: 11/25/2017    History of Present Illness This 72 y.o. male admitted with generalized weakness and unsteady gait.  Head CT negative for acute infarct, and he received tPA due to presumed ischemic CVA.  MRI with no acute infarct.  He has remained confused and developed fever, tachycardia, and tachypneic.   Dx:  Likely sepsis due to UTI, acute encephalopathy.  PMH includes:  CLL, CAD, DM, GERD, HTN, recent bladder outlet obstruction., c/p ACDF C3-5    PT Comments    Patient is progressing very well towards their physical therapy goals. Seems more alert this session, but did seem fatigued after session (patient had previously received a bath sitting EOB). Requiring less overall assistance with functional mobility. Able to progress from bed to chair with up to two person moderate assistance using the walker. Rest of session focused on lower extremity strengthening at the chair level. D/c plan remains appropriate currently. Recommend CIR to maximize functional independence. Suspect patient will continue to progress well based on his motivation, PLOF, and excellent family support.    Follow Up Recommendations  CIR     Equipment Recommendations  (TBA next venue)    Recommendations for Other Services Rehab consult     Precautions / Restrictions Precautions Precautions: Fall Restrictions Weight Bearing Restrictions: No    Mobility  Bed Mobility               General bed mobility comments: Sitting on edge of bed with nurse tech upon arrival  Transfers Overall transfer level: Needs assistance Equipment used: Rolling walker (2 wheeled) Transfers: Sit to/from [employer] Transfers Sit to Stand: Mod assist;+2 physical assistance Stand pivot transfers: Min assist;+2 safety/equipment       General transfer comment: Requiring moderate assistance to boost into standing from  elevated bed surface. Cueing for rocking forward to gain momentum, nose over toes, and appropriate hand placement. Min assist to transition from bed to chair.  Ambulation/Gait                 Stairs             Wheelchair Mobility    Modified Rankin (Stroke Patients Only)       Balance Overall balance assessment: Needs assistance Sitting-balance support: Feet supported;Bilateral upper extremity supported Sitting balance-Leahy Scale: Good     Standing balance support: Bilateral upper extremity supported Standing balance-Leahy Scale: Poor Standing balance comment: requires Bil. UE support                             Cognition Arousal/Alertness: Awake/alert Behavior During Therapy: Flat affect Overall Cognitive Status: Impaired/Different from baseline Area of Impairment: Orientation;Attention;Memory;Following commands;Safety/judgement;Problem solving;Awareness                 Orientation Level: Disoriented to;Time Current Attention Level: Sustained Memory: Decreased short-term memory Following Commands: Follows one step commands with increased time Safety/Judgement: Decreased awareness of safety;Decreased awareness of deficits   Problem Solving: Slow processing;Decreased initiation;Difficulty sequencing;Requires verbal cues;Requires tactile cues        Exercises General Exercises - Lower Extremity Long Arc Quad: Both;10 reps;Seated Hip Flexion/Marching: Both;10 reps;Seated    General Comments        Pertinent Vitals/Pain Pain Assessment: Faces Faces Pain Scale: Hurts little more Pain Location: Scrotal pain/swelling; notified RN Pain Descriptors / Indicators: Discomfort Pain Intervention(s): Monitored during session;Other (comment)(RN notified)  Home Living                      Prior Function            PT Goals (current goals can now be found in the care plan section) Acute Rehab PT Goals Patient Stated Goal: To  eat lunch  PT Goal Formulation: With patient Time For Goal Achievement: 12/08/17 Potential to Achieve Goals: Good    Frequency    Min 4X/week      PT Plan Current plan remains appropriate    Co-evaluation              AM-PAC PT "6 Clicks" Daily Activity  Outcome Measure  Difficulty turning over in bed (including adjusting bedclothes, sheets and blankets)?: Unable Difficulty moving from lying on back to sitting on the side of the bed? : Unable Difficulty sitting down on and standing up from a chair with arms (e.g., wheelchair, bedside commode, etc,.)?: Unable Help needed moving to and from a bed to chair (including a wheelchair)?: A Lot Help needed walking in hospital room?: A Lot Help needed climbing 3-5 steps with a railing? : Total 6 Click Score: 8    End of Session Equipment Utilized During Treatment: Gait belt Activity Tolerance: Patient tolerated treatment well;Patient limited by fatigue Patient left: in chair;with call bell/phone within reach;with chair alarm set;with nursing/sitter in room Nurse Communication: Mobility status PT Visit Diagnosis: Unsteadiness on feet (R26.81);Other symptoms and signs involving the nervous system (R29.898);Difficulty in walking, not elsewhere classified (R26.2)     Time: 9147-8295 PT Time Calculation (min) (ACUTE ONLY): 17 min  Charges:  $Therapeutic Activity: 8-22 mins                    G Codes:       Ellamae Sia, PT, DPT Acute Rehabilitation Services  Pager: [phone]    Willy Eddy 11/25/2017, 5:16 PM

## 2017-11-25 NOTE — Progress Notes (Signed)
STROKE TEAM PROGRESS NOTE   SUBJECTIVE (INTERVAL HISTORY) No family is at the bedside. Pt fever resolved. Leukocytosis improved. On zosyn. Blood culture showed G- rods, concerning for pseudomonas infection. US renal showed b/l kidney cysts, recommend MRI of abdomen.    OBJECTIVE Temp:  [98.5 F (36.9 C)-100.9 F (38.3 C)] 99.2 F (37.3 C) (07/05 0700) Pulse Rate:  [84-110] 99 (07/05 0900) Cardiac Rhythm: Normal sinus rhythm (07/05 0800) Resp:  [14-29] 22 (07/05 0900) BP: (100-147)/(58-104) 135/72 (07/05 0900) SpO2:  [90 %-99 %] 95 % (07/05 0900)  Recent Labs  Lab 11/24/17 1130 11/24/17 1706 11/24/17 2238 11/25/17 0710 11/25/17 1122  GLUCAP 211* 202* 152* 226* 168*   Recent Labs  Lab 11/22/17 1844 11/22/17 1858 11/23/17 0719 11/24/17 0400 11/24/17 1130 11/25/17 0235  NA 141 140 140 137 137 137  K 3.3* 3.2* 3.5 3.8 3.3* 3.6  CL 103 101 102 100 98 100  CO2 24  --  28 28 27 29   GLUCOSE 184* 180* 167* 146* 213* 149*  BUN 12 13 13 13 11 12   CREATININE 1.31* 1.20 1.31* 1.07 1.27* 1.25*  CALCIUM 9.5  --  9.0 8.7* 8.4* 8.1*   Recent Labs  Lab 11/22/17 1844 11/24/17 1130  AST 26 23  ALT 23 18  ALKPHOS 59 60  BILITOT 1.1 1.7*  PROT 7.2 6.2*  ALBUMIN 4.1 3.3*   Recent Labs  Lab 11/22/17 1844 11/22/17 1858 11/23/17 0719 11/24/17 0400 11/24/17 1130 11/25/17 0235  WBC 11.4*  --  15.8* 16.8* 16.8* 12.5*  NEUTROABS 9.1*  --   --  13.6*  --  10.4*  HGB 14.9 14.6 14.7 15.1 13.7 12.3*  HCT 41.9 43.0 41.1 42.8 38.6* 35.4*  MCV 87.3  --  87.8 88.4 87.9 88.5  PLT 190  --  180 153 126* 122*   No results for input(s): CKTOTAL, CKMB, CKMBINDEX, TROPONINI in the last 168 hours. Recent Labs    11/22/17 1844  LABPROT 13.5  INR 1.04   Recent Labs    11/24/17 0732 11/24/17 1150  COLORURINE YELLOW YELLOW  LABSPEC 1.018 1.016  PHURINE 5.0 5.0  GLUCOSEU NEGATIVE NEGATIVE  HGBUR LARGE* MODERATE*  BILIRUBINUR NEGATIVE NEGATIVE  KETONESUR 20* 5*  PROTEINUR NEGATIVE  NEGATIVE  NITRITE NEGATIVE NEGATIVE  LEUKOCYTESUR MODERATE* NEGATIVE       Component Value Date/Time   CHOL 90 11/23/2017 0227   TRIG 72 11/23/2017 0227   HDL 27 (L) 11/23/2017 0227   CHOLHDL 3.3 11/23/2017 0227   VLDL 14 11/23/2017 0227   LDLCALC 49 11/23/2017 0227   Lab Results  Component Value Date   HGBA1C 6.7 (H) 11/23/2017      Component Value Date/Time   LABOPIA NONE DETECTED 11/22/2017 1931   COCAINSCRNUR NONE DETECTED 11/22/2017 1931   LABBENZ NONE DETECTED 11/22/2017 1931   AMPHETMU NONE DETECTED 11/22/2017 1931   THCU NONE DETECTED 11/22/2017 1931   LABBARB (A) 11/22/2017 1931    Result not available. Reagent lot number recalled by manufacturer.    No results for input(s): ETH in the last 168 hours.  I have personally reviewed the radiological images below and agree with the radiology interpretations.  Ct Angio Head W Or Wo Contrast  Result Date: 11/22/2017 CLINICAL DATA:  Initial evaluation for acute dizziness, confusion. EXAM: CT ANGIOGRAPHY HEAD AND NECK TECHNIQUE: Multidetector CT imaging of the head and neck was performed using the standard protocol during bolus administration of intravenous contrast. Multiplanar CT image reconstructions and MIPs were obtained to  evaluate the vascular anatomy. Carotid stenosis measurements (when applicable) are obtained utilizing NASCET criteria, using the distal internal carotid diameter as the denominator. CONTRAST:  53mL ISOVUE-370 IOPAMIDOL (ISOVUE-370) INJECTION 76% COMPARISON:  Prior CT and MRI from earlier the same day. FINDINGS: CTA NECK FINDINGS Aortic arch: Visualized aortic arch of normal caliber with normal 3 vessel morphology. No flow-limiting stenosis about the origin of the great vessels. Visualized subclavian arteries widely patent. Right carotid system: Right common and internal carotid arteries are widely patent without stenosis, dissection, or occlusion. No atheromatous narrowing about the right carotid bifurcation.  Left carotid system: Left common and internal carotid arteries widely patent without stenosis, dissection, or occlusion. No atheromatous narrowing about the left carotid bifurcation. Vertebral arteries: Both of the vertebral arteries arise from the subclavian arteries. Vertebral arteries patent within the neck without stenosis, dissection, or occlusion. Evaluation the vertebral arteries mildly limited at the C3 through C5 levels due to adjacent ACDF. Skeleton: No acute osseous abnormality. No worrisome lytic or blastic osseous lesions. Patient is status post prior cervical fusion at C3 through C7. Other neck: No acute soft tissue abnormality within the neck. 17 mm hypodense right thyroid nodule, indeterminate. Upper chest: Visualized upper chest demonstrates no acute finding. Scattered atelectatic changes noted dependently within the visualized lungs. Visualized lungs are otherwise clear. Review of the MIP images confirms the above findings CTA HEAD FINDINGS Anterior circulation: Internal carotid arteries patent to the termini without stenosis. Minimal plaque within the cavernous ICAs bilaterally. A1 segments patent bilaterally. Normal anterior communicating artery. Azygos ACA, patent to its distal aspect. M1 segments patent without stenosis or occlusion. No proximal M2 occlusion. Distal MCA branches well perfused and symmetric. Posterior circulation: Vertebral arteries patent to the vertebrobasilar junction without stenosis. Left vertebral artery slightly dominant. Patent left PICA. Right PICA not well visualized. Basilar widely patent to its distal aspect. Superior cerebellar and posterior cerebral arteries widely patent bilaterally. Venous sinuses: Patent. Anatomic variants: Azygos ACA.  No intracranial aneurysm. Delayed phase: No abnormal enhancement. Review of the MIP images confirms the above findings IMPRESSION: 1. Negative CTA with no evidence for large vessel occlusion. No hemodynamically significant or  correctable stenosis identified. 2. Azygos ACA. 3. 1.7 cm right thyroid nodule, indeterminate. Follow-up examination with dedicated thyroid ultrasound recommended for further characterization. Electronically Signed   By: Jeannine Boga M.D.   On: 11/22/2017 22:14   Ct Head Wo Contrast  Result Date: 11/23/2017 CLINICAL DATA:  24 hour tPA follow-up EXAM: CT HEAD WITHOUT CONTRAST TECHNIQUE: Contiguous axial images were obtained from the base of the skull through the vertex without intravenous contrast. COMPARISON:  Head CT 11/22/2017 FINDINGS: Brain: There is no mass, hemorrhage or extra-axial collection. The size and configuration of the ventricles and extra-axial CSF spaces are normal. There is no acute or chronic infarction. The brain parenchyma is normal. Vascular: No abnormal hyperdensity of the major intracranial arteries or dural venous sinuses. No intracranial atherosclerosis. Skull: The visualized skull base, calvarium and extracranial soft tissues are normal. Sinuses/Orbits: No fluid levels or advanced mucosal thickening of the visualized paranasal sinuses. No mastoid or middle ear effusion. The orbits are normal. IMPRESSION: No intracranial hemorrhage. Electronically Signed   By: Ulyses Jarred M.D.   On: 11/23/2017 20:12   Ct Angio Neck W And/or Wo Contrast  Result Date: 11/22/2017 CLINICAL DATA:  Initial evaluation for acute dizziness, confusion. EXAM: CT ANGIOGRAPHY HEAD AND NECK TECHNIQUE: Multidetector CT imaging of the head and neck was performed using the standard protocol during  bolus administration of intravenous contrast. Multiplanar CT image reconstructions and MIPs were obtained to evaluate the vascular anatomy. Carotid stenosis measurements (when applicable) are obtained utilizing NASCET criteria, using the distal internal carotid diameter as the denominator. CONTRAST:  44mL ISOVUE-370 IOPAMIDOL (ISOVUE-370) INJECTION 76% COMPARISON:  Prior CT and MRI from earlier the same day.  FINDINGS: CTA NECK FINDINGS Aortic arch: Visualized aortic arch of normal caliber with normal 3 vessel morphology. No flow-limiting stenosis about the origin of the great vessels. Visualized subclavian arteries widely patent. Right carotid system: Right common and internal carotid arteries are widely patent without stenosis, dissection, or occlusion. No atheromatous narrowing about the right carotid bifurcation. Left carotid system: Left common and internal carotid arteries widely patent without stenosis, dissection, or occlusion. No atheromatous narrowing about the left carotid bifurcation. Vertebral arteries: Both of the vertebral arteries arise from the subclavian arteries. Vertebral arteries patent within the neck without stenosis, dissection, or occlusion. Evaluation the vertebral arteries mildly limited at the C3 through C5 levels due to adjacent ACDF. Skeleton: No acute osseous abnormality. No worrisome lytic or blastic osseous lesions. Patient is status post prior cervical fusion at C3 through C7. Other neck: No acute soft tissue abnormality within the neck. 17 mm hypodense right thyroid nodule, indeterminate. Upper chest: Visualized upper chest demonstrates no acute finding. Scattered atelectatic changes noted dependently within the visualized lungs. Visualized lungs are otherwise clear. Review of the MIP images confirms the above findings CTA HEAD FINDINGS Anterior circulation: Internal carotid arteries patent to the termini without stenosis. Minimal plaque within the cavernous ICAs bilaterally. A1 segments patent bilaterally. Normal anterior communicating artery. Azygos ACA, patent to its distal aspect. M1 segments patent without stenosis or occlusion. No proximal M2 occlusion. Distal MCA branches well perfused and symmetric. Posterior circulation: Vertebral arteries patent to the vertebrobasilar junction without stenosis. Left vertebral artery slightly dominant. Patent left PICA. Right PICA not well  visualized. Basilar widely patent to its distal aspect. Superior cerebellar and posterior cerebral arteries widely patent bilaterally. Venous sinuses: Patent. Anatomic variants: Azygos ACA.  No intracranial aneurysm. Delayed phase: No abnormal enhancement. Review of the MIP images confirms the above findings IMPRESSION: 1. Negative CTA with no evidence for large vessel occlusion. No hemodynamically significant or correctable stenosis identified. 2. Azygos ACA. 3. 1.7 cm right thyroid nodule, indeterminate. Follow-up examination with dedicated thyroid ultrasound recommended for further characterization. Electronically Signed   By: Jeannine Boga M.D.   On: 11/22/2017 22:14   Mr Brain Wo Contrast  Result Date: 11/22/2017 CLINICAL DATA:  Ataxia.  Difficulty speaking.  Given tPA. EXAM: MRI HEAD WITHOUT CONTRAST TECHNIQUE: Multiplanar, multiecho pulse sequences of the brain and surrounding structures were obtained without intravenous contrast. COMPARISON:  Head CT 11/22/2017 FINDINGS: An abbreviated protocol was performed at the direction of the attending stroke neurologist. Axial and coronal diffusion, sagittal T1, and axial FLAIR sequences were obtained Brain: There is no evidence of acute infarct, intracranial hemorrhage, mass, midline shift, or extra-axial fluid collection. Mild generalized cerebral atrophy is within normal limits for age. No significant cerebral white matter disease is seen. Vascular: Limited assessment on this abbreviated MRI. Skull and upper cervical spine: No suspicious marrow lesion. Prior anterior cervical fusion. Sinuses/Orbits: No acute findings. Other: None. IMPRESSION: Abbreviated brain MRI without evidence of acute infarct or other significant intracranial abnormality. Electronically Signed   By: Logan Bores M.D.   On: 11/22/2017 20:47   US Renal  Result Date: 11/25/2017 CLINICAL DATA:  Sepsis.  Hematuria and dysuria. EXAM: RENAL /  URINARY TRACT ULTRASOUND COMPLETE COMPARISON:   MRI of the lumbar spine June 25, 2017 FINDINGS: Right Kidney: Length: 10.2 cm. There is a cyst in the upper pole measuring 6 x 5 x 7 cm with a thin septation. Two hypoechoic masses are seen in the midpole measuring up to 2.7 cm. These are not definitively cystic based on ultrasound images provided. No hydronephrosis in the right kidney. Left Kidney: A normal left kidney is not identified. Multiple large cysts are seen in the left renal fossa, possibly replacing the left kidney but not specific. Bladder: Appears normal for degree of bladder distention. IMPRESSION: 1. Multiple cysts are seen in the left side of the abdomen/renal fossa. These are nonspecific but could represent cysts replacing the left kidney. 2. No acute abnormality in the right kidney. There is at least 1 cyst on the right. Two other hypoechoic masses may represent complicated cysts but are not specific on provided images. Recommend MRI of the abdomen to further assess the cystic mass on the left and the hypoechoic masses in the right kidney. Electronically Signed   By: Dorise Bullion III M.D   On: 11/25/2017 09:57   Dg Chest Port 1 View  Result Date: 11/24/2017 CLINICAL DATA:  Fevers EXAM: PORTABLE CHEST 1 VIEW COMPARISON:  04/18/2017 FINDINGS: Cardiac shadow is within normal limits. Lungs are well aerated bilaterally but hypoinflated. No focal infiltrate or sizable effusion is seen. No bony abnormality is noted. IMPRESSION: No active disease. Electronically Signed   By: Inez Catalina M.D.   On: 11/24/2017 07:50   Ct Head Code Stroke Wo Contrast  Result Date: 11/22/2017 CLINICAL DATA:  Code stroke. Dizziness, confusion, nausea, and vomiting. EXAM: CT HEAD WITHOUT CONTRAST TECHNIQUE: Contiguous axial images were obtained from the base of the skull through the vertex without intravenous contrast. COMPARISON:  05/22/2016 FINDINGS: Brain: There is no evidence of acute infarct, intracranial hemorrhage, mass, midline shift, or extra-axial fluid  collection. Mild generalized cerebral atrophy is not greater than expected for age. Vascular: Calcified atherosclerosis at the skull base. No hyperdense vessel. Skull: No fracture or focal osseous lesion. Sinuses/Orbits: Visualized paranasal sinuses and mastoid air cells are clear. Orbits are unremarkable. Other: Numerous dermal calcifications at the vertex. ASPECTS [facility] Stroke Program Early CT Score) Not scored with this history. IMPRESSION: No evidence of acute intracranial abnormality. These results were communicated to Dr. Lorraine Lax at 7:15 pm on 11/22/2017 by text page via the [employer] messaging system. Electronically Signed   By: Logan Bores M.D.   On: 11/22/2017 19:15    TTE  - Left ventricle: The cavity size was normal. There was moderate   focal basal hypertrophy of the septum. Systolic function was   vigorous. The estimated ejection fraction was in the range of 65%   to 70%. Doppler parameters are consistent with abnormal left   ventricular relaxation (grade 1 diastolic dysfunction). There was   no evidence of elevated ventricular filling pressure by Doppler   parameters. - Aortic valve: Valve area (VTI): 2.6 cm^2. Valve area (Vmean):   2.44 cm^2. - Mitral valve: There was no regurgitation. - Right ventricle: The cavity size was normal. Wall thickness was   normal. Systolic function was normal. - Right atrium: The atrium was normal in size. - Tricuspid valve: There was trivial regurgitation. - Pulmonary arteries: Systolic pressure was within the normal   range. - Pericardium, extracardiac: The pericardium was normal in   appearance. Impressions: - No cardiac source of emboli was indentified.   PHYSICAL  EXAM  Temp:  [98.5 F (36.9 C)-100.9 F (38.3 C)] 99.2 F (37.3 C) (07/05 0700) Pulse Rate:  [84-110] 99 (07/05 0900) Resp:  [14-29] 22 (07/05 0900) BP: (100-147)/(58-104) 135/72 (07/05 0900) SpO2:  [90 %-99 %] 95 % (07/05 0900)  General - Well nourished, well developed, in  no apparent distress.  Ophthalmologic - fundi not visualized due to noncooperation.  Cardiovascular - Regular rhythm and rate.  Abdomen -soft, nontender, bowel sounds present, no flank tenderness at costovertebral angles.  Mental Status -  Level of arousal and orientation to month, place, and person were intact, but not to year. Language including expression, naming, repetition, comprehension was assessed and found intact.  Cranial Nerves II - XII - II - Visual field intact OU. III, IV, VI - Extraocular movements intact. V - Facial sensation intact bilaterally. VII - Facial movement intact bilaterally. VIII - Hearing & vestibular intact bilaterally. X - Palate elevates symmetrically. XI - Chin turning & shoulder shrug intact bilaterally. XII - Tongue protrusion intact.  Motor Strength - The patient's strength was symmetrical in all extremities and pronator drift was absent.  Bulk was normal and fasciculations were absent.   Motor Tone - Muscle tone was assessed at the neck and appendages and was normal.  Reflexes - The patient's reflexes were symmetrical in all extremities and he had no pathological reflexes.  Sensory - Light touch, temperature/pinprick were assessed and were symmetrical.    Coordination - The patient had normal movements in the hands with no ataxia or dysmetria.  Tremor was absent.  Gait and Station - deferred.   ASSESSMENT/PLAN Mr. Acxel Dingee is a 72 y.o. male with history of CAD, DM, HTN, CLL, cervical myelopathy s/p decompression in 03/2017 currently walk with walker admitted for vomiting, generalized weakness. tPA given.    Sepsis/G- rod bacteremia  MRI  No acute infarct  CTA head and neck unremarkable  2D Echo EF 65 to 70%  US renal - b/l kidney cyst and recommend MRI  Recommend TEE to rule out endocarditis  LDL 49  HgbA1c 6.7  Blood culture - G- rods concerning for pseudomonas infection  SCDs for VTE prophylaxis  aspirin 81 mg daily  prior to admission, now on ASA 81mg .   Therapy recommendations:  Pending   Disposition:  Pending   UTI vs. Pyelonephritis  History of right large kidney cyst as per patient and wife  UA WBC > 50 and RBC 21-50  Urine culture positive for peudomonas  [place] ultrasound - b/l kidney cyst  Recommend MRI abdomen to evaluate kidney condition  Fever and leukocytosis  T-max 103.2->99.2  WBC 11.4-> 15.8-> 16.8->12.5  Blood culture positive for G- rod  Lactic acid normal  Response to zosyn well  CCM on board  Diabetes  HgbA1c 6.7 goal < 7.0  Controlled  CBG monitoring  SSI  Hypertension Stable Resumed atenolol and amlodipine  Long term BP goal normotensive  Hyperlipidemia  Home meds:  lipitor 40  LDL 49, goal < 70  Now on home lipitor  Continue statin at discharge  Other Stroke Risk Factors  Advanced age  ETOH use  Obesity, Body mass index is 35.55 kg/m.   Coronary artery disease  Other Active Problems  Hypokalemia - supplement, resolved  Hospital day # 3  Neurology will sign off. Please call with questions. No neuro follow up needed at this time. Thanks for the consult.   Rosalin Hawking, MD PhD Stroke Neurology 11/25/2017 11:26 AM    To contact  Stroke Continuity provider, please refer to [email or web address] After hours, contact General Neurology

## 2017-11-25 NOTE — Plan of Care (Signed)
Patient stable, discussed POC with patient, denies question/concerns at this time.  

## 2017-11-25 NOTE — Progress Notes (Signed)
PULMONARY / CRITICAL CARE MEDICINE   Name: Ronald Rice MRN: [id number] DOB: 03/04/1946    ADMISSION DATE:  11/22/2017   CHIEF COMPLAINT:  confusion  HISTORY OF72 y/o male with CLL was admitted with generalized weakness, unsteady gait on 7/2 and was treated for presumed acute ischemic stroke with IV tPA.  The patient's MRI of the brain was within normal limits.  After 2 days of his hospitalization he remained confused, developed a fever, tachycardia and tachypnea so pulmonary and critical care medicine was consulted for further evaluation.  The patient's wife provides the history because he is slightly confused.  She says that for 1 week prior to admission he was taking an antibiotic because of bronchitis.  He did not complete the course of antibiotics.  She also notes that in late June he was using a urinary catheter placed by the urologist for approximately 1 week.  She says that he has had problems with hematuria and dysuria over the years but recently has not been complaining of dysuria.  He has been started on empiric antibiotics for urinary tract infection and overnight has required no pressors.  This morning he tells me he feels quite well with no further complaint of confusion or weakness.  Blood culture obtained on 7/4 is growing Pseudomonas.    PRESENT ILLNESS:     PAST MEDICAL HISTORY :  He  has a past medical history of Cancer ([place]), CLL (chronic lymphocytic leukemia) ([place]), Coronary artery disease, Diabetes mellitus without complication ([place]), GERD (gastroesophageal reflux disease), and Hypertension.  PAST SURGICAL HISTORY: He  has a past surgical history that includes Back surgery; Colonoscopy; and Anterior cervical decomp/discectomy fusion (N/A, 04/20/2017).  No Known Allergies  No current facility-administered medications on file prior to encounter.    Current Outpatient Medications on File Prior to Encounter  Medication Sig  . allopurinol (ZYLOPRIM) 300 MG tablet Take  300 mg by mouth daily.  Marland Kitchen amLODipine (NORVASC) 5 MG tablet Take 5 mg by mouth daily.  Marland Kitchen aspirin EC 81 MG tablet Take 81 mg by mouth daily.  Marland Kitchen atenolol (TENORMIN) 25 MG tablet Take 25 mg by mouth daily.   Marland Kitchen atorvastatin (LIPITOR) 40 MG tablet Take 40 mg by mouth daily.  . cephALEXin (KEFLEX) 500 MG capsule Take 500 mg by mouth 2 (two) times daily.  Marland Kitchen CIALIS 20 MG tablet Take 20 mg by mouth as needed for erectile dysfunction.  . Cinnamon 500 MG TABS Take 500 mg by mouth daily.  . famotidine (PEPCID) 20 MG tablet Take 20 mg by mouth at bedtime.  . gabapentin (NEURONTIN) 300 MG capsule Take 1 capsule (300 mg total) by mouth at bedtime.  Jonna Clark Jelly (GINSENG COMPLEX/ROYAL JELLY) 800-200 MG CAPS Take 1 capsule by mouth daily.  Marland Kitchen ketoconazole (NIZORAL) 2 % cream Apply 1 application topically as needed for irritation.  Marland Kitchen lisinopril-hydrochlorothiazide (PRINZIDE,ZESTORETIC) 20-25 MG tablet Take 2 tablets by mouth daily.  . Multiple Vitamins-Minerals (CENTRUM SILVER 50+MEN PO) Take 1 tablet by mouth daily.  . naproxen sodium (ALEVE) 220 MG tablet Take 220 mg by mouth 2 (two) times daily as needed (PAIN).  . NON FORMULARY Take 1 tablet by mouth daily. Blood Sugar Defense   . potassium chloride (K-DUR) 10 MEQ tablet Take 10 mEq by mouth daily.   . tamsulosin (FLOMAX) 0.4 MG CAPS capsule Take 0.4 mg by mouth daily.  Marland Kitchen tiZANidine (ZANAFLEX) 4 MG tablet Take 4 mg by mouth every 12 (twelve) hours as needed for muscle spasms.  Marland Kitchen  Xylometazoline HCl (4-WAY NASAL SPRAY NA) Place 1 spray into both nostrils as needed (for allergies).    FAMILY HISTORY:  His indicated that his mother is deceased. He indicated that his father is deceased.   SOCIAL HISTORY: He  reports that he has never smoked. He has never used smokeless tobacco. He reports that he drinks alcohol. He reports that he does not use drugs.  REVIEW OF SYSTEMS:     SUBJECTIVE:  As above  VITAL SIGNS: BP 137/70   Pulse 88   Temp  99.2 F (37.3 C) (Oral)   Resp (!) 27   Ht 6\' 2"  (1.88 m)   Wt 276 lb 14.4 oz (125.6 kg)   SpO2 95%   BMI 35.55 kg/m   HEMODYNAMICS:    VENTILATOR SETTINGS:    INTAKE / OUTPUT: I/O last 3 completed shifts: In: 3874 [I.V.:2732.8; IV Piggyback:1141.2] Out: 1810 [Urine:1810]  PHYSICAL EXAMINATION: General: Very pleasant male appearing less than his stated age in no acute distress Neuro: Alert and oriented x3 Cardiovascular: S1 and S2 are regular without murmur rub or gallop Lungs: Unlabored with symmetric air movement and good air movement throughout.  There are no wheezes rhonchi or rales Abdomen: The abdomen is obese soft and nontender Musculoskeletal: No edema  LABS:  BMET Recent Labs  Lab 11/24/17 0400 11/24/17 1130 11/25/17 0235  NA 137 137 137  K 3.8 3.3* 3.6  CL 100 98 100  CO2 28 27 29   BUN 13 11 12   CREATININE 1.07 1.27* 1.25*  GLUCOSE 146* 213* 149*    Electrolytes Recent Labs  Lab 11/24/17 0400 11/24/17 1130 11/25/17 0235  CALCIUM 8.7* 8.4* 8.1*    CBC Recent Labs  Lab 11/24/17 0400 11/24/17 1130 11/25/17 0235  WBC 16.8* 16.8* 12.5*  HGB 15.1 13.7 12.3*  HCT 42.8 38.6* 35.4*  PLT 153 126* 122*    Coag's Recent Labs  Lab 11/22/17 1844  APTT 25  INR 1.04    Sepsis Markers Recent Labs  Lab 11/24/17 0916 11/24/17 1130  LATICACIDVEN 1.1 1.2  PROCALCITON  --  0.47    ABG No results for input(s): PHART, PCO2ART, PO2ART in the last 168 hours.  Liver Enzymes Recent Labs  Lab 11/22/17 1844 11/24/17 1130  AST 26 23  ALT 23 18  ALKPHOS 59 60  BILITOT 1.1 1.7*  ALBUMIN 4.1 3.3*    Cardiac Enzymes No results for input(s): TROPONINI, PROBNP in the last 168 hours.  Glucose Recent Labs  Lab 11/24/17 0436 11/24/17 0744 11/24/17 1130 11/24/17 1706 11/24/17 2238 11/25/17 0710  GLUCAP 149* 164* 211* 202* 152* 226*    Imaging No results found.   CULTURES: Blood of 7/4 identified as having Pseudomonas by PCR.  No  sensitivities yet reported  ANTIBIOTICS: Zosyn  SIGNIFICANT EVENTS:   LINES/TUBES:   DISCUSSION:     This is a 72 year old diabetic with a history of CLL initially admitted for confusion and weakness along with an unsteady gait.  He received TPA for presumed CVA.  MRI showed no evidence of CVA and he developed signs of sepsis.  He is growing Pseudomonas from the blood.  ASSESSMENT / PLAN:  PULMONARY A: No active issues  CARDIOVASCULAR A: Hemodynamically stable without the need for pressors despite his bloodstream infection.   GASTROINTESTINAL A: Prophylaxis is with Protonix  HEMATOLOGIC History of CLL but no lymphocytosis on current CBC  INFECTIOUS A: Pseudomonas bacteremia.  Presumed urinary source.  He does have a history of cyst.  I  will obtain imaging of the kidneys to ensure that we do not have a complex infection or obstruction   ENDOCRINE A: Glycemic control is slightly suboptimal but I anticipate that it will improve as his infection comes under control  He is stable for transfer out of the intensive care unit.   Lars Masson, MD Critical Care Medicine [facility] Pager: [phone]  11/25/2017, 7:26 AM

## 2017-11-25 NOTE — Progress Notes (Signed)
PHARMACY - PHYSICIAN COMMUNICATION CRITICAL VALUE ALERT - BLOOD CULTURE IDENTIFICATION (BCID)  Ronald Rice is an 72 y.o. male who presented to [facility] on 11/22/2017 with a chief complaint of confusion- received tPA for a code stroke, but MRI was negative.  Assessment:  Patient was on antibiotics for 1 week PTA for bronchitis, but did not complete the course. He has a urinary catheter which was placed in late June by urology. Sepsis thought to be from urinary source. Bcx with 1/2 pseudomonas with no resistance.  Name of physician (or Provider) Contacted: E. Deterding  Current antibiotics: Zosyn 3.375g IV q8h EI  Changes to prescribed antibiotics recommended:  Patient is on recommended antibiotics - No changes needed as Zosyn covers pseudomonas. Can de-escalate when full sensitivies result  Results for orders placed or performed during the hospital encounter of 11/22/17  Blood Culture ID Panel (Reflexed) (Collected: 11/24/2017  7:01 AM)  Result Value Ref Range   Enterococcus species NOT DETECTED NOT DETECTED   Listeria monocytogenes NOT DETECTED NOT DETECTED   Staphylococcus species NOT DETECTED NOT DETECTED   Staphylococcus aureus NOT DETECTED NOT DETECTED   Streptococcus species NOT DETECTED NOT DETECTED   Streptococcus agalactiae NOT DETECTED NOT DETECTED   Streptococcus pneumoniae NOT DETECTED NOT DETECTED   Streptococcus pyogenes NOT DETECTED NOT DETECTED   Acinetobacter baumannii NOT DETECTED NOT DETECTED   Enterobacteriaceae species NOT DETECTED NOT DETECTED   Enterobacter cloacae complex NOT DETECTED NOT DETECTED   Escherichia coli NOT DETECTED NOT DETECTED   Klebsiella oxytoca NOT DETECTED NOT DETECTED   Klebsiella pneumoniae NOT DETECTED NOT DETECTED   Proteus species NOT DETECTED NOT DETECTED   Serratia marcescens NOT DETECTED NOT DETECTED   Carbapenem resistance NOT DETECTED NOT DETECTED   Haemophilus influenzae NOT DETECTED NOT DETECTED   Neisseria meningitidis NOT  DETECTED NOT DETECTED   Pseudomonas aeruginosa DETECTED (A) NOT DETECTED   Candida albicans NOT DETECTED NOT DETECTED   Candida glabrata NOT DETECTED NOT DETECTED   Candida krusei NOT DETECTED NOT DETECTED   Candida parapsilosis NOT DETECTED NOT DETECTED   Candida tropicalis NOT DETECTED NOT DETECTED    Norris Bodley 11/25/2017  12:38 AM

## 2017-11-26 DIAGNOSIS — R531 Weakness: Secondary | ICD-10-CM

## 2017-11-26 DIAGNOSIS — R270 Ataxia, unspecified: Secondary | ICD-10-CM

## 2017-11-26 LAB — URINE CULTURE: Culture: 30000 — AB

## 2017-11-26 LAB — CBC WITH DIFFERENTIAL/PLATELET
Abs Immature Granulocytes: 0 K/uL (ref 0.0–0.1)
Basophils Absolute: 0 K/uL (ref 0.0–0.1)
Basophils Relative: 0 %
Eosinophils Absolute: 0.1 K/uL (ref 0.0–0.7)
Eosinophils Relative: 2 %
HCT: 36.8 % — ABNORMAL LOW (ref 39.0–52.0)
Hemoglobin: 13.1 g/dL (ref 13.0–17.0)
Immature Granulocytes: 1 %
Lymphocytes Relative: 17 %
Lymphs Abs: 1.1 K/uL (ref 0.7–4.0)
MCH: 31.3 pg (ref 26.0–34.0)
MCHC: 35.6 g/dL (ref 30.0–36.0)
MCV: 87.8 fL (ref 78.0–100.0)
Monocytes Absolute: 1.1 K/uL — ABNORMAL HIGH (ref 0.1–1.0)
Monocytes Relative: 18 %
Neutro Abs: 4.1 K/uL (ref 1.7–7.7)
Neutrophils Relative %: 64 %
Platelets: 154 K/uL (ref 150–400)
RBC: 4.19 MIL/uL — ABNORMAL LOW (ref 4.22–5.81)
RDW: 12.2 % (ref 11.5–15.5)
WBC: 6.5 K/uL (ref 4.0–10.5)

## 2017-11-26 LAB — BASIC METABOLIC PANEL
Anion gap: 10 (ref 5–15)
BUN: 11 mg/dL (ref 8–23)
CO2: 27 mmol/L (ref 22–32)
Calcium: 8.4 mg/dL — ABNORMAL LOW (ref 8.9–10.3)
Chloride: 101 mmol/L (ref 98–111)
Creatinine, Ser: 1.14 mg/dL (ref 0.61–1.24)
GFR calc Af Amer: >60 mL/min (ref >60)
GFR calc non Af Amer: >60 mL/min (ref >60)
Glucose, Bld: 137 mg/dL — ABNORMAL HIGH (ref 70–99)
Potassium: 3.2 mmol/L — ABNORMAL LOW (ref 3.5–5.1)
Sodium: 138 mmol/L (ref 135–145)

## 2017-11-26 LAB — GLUCOSE, CAPILLARY
Glucose-Capillary: 99 mg/dL (ref 70–99)
Glucose-Capillary: 151 mg/dL — ABNORMAL HIGH (ref 70–99)

## 2017-11-26 MED ORDER — CEFPODOXIME PROXETIL 200 MG PO TABS: 200.0000 mg | ORAL_TABLET | Freq: Two times a day (BID) | ORAL | 0 refills | Status: DC

## 2017-11-26 MED ORDER — POTASSIUM CHLORIDE CRYS ER 20 MEQ PO TBCR
40.0000 meq | EXTENDED_RELEASE_TABLET | Freq: Once | ORAL | Status: AC
  Administered 2017-11-26: 40 meq via ORAL
  Filled 2017-11-26: qty 2

## 2017-11-26 NOTE — Progress Notes (Signed)
Physical Therapy Treatment Patient Details Name: Ronald Rice MRN: [id number] DOB: 1946-04-19 Today's Date: 11/26/2017    History of Present Illness This 72 y.o. male admitted with generalized weakness and unsteady gait.  Head CT negative for acute infarct, and he received tPA due to presumed ischemic CVA.  MRI with no acute infarct.  He has remained confused and developed fever, tachycardia, and tachypneic.   Dx:  Likely sepsis due to UTI, acute encephalopathy.  PMH includes:  CLL, CAD, DM, GERD, HTN, recent bladder outlet obstruction., c/p ACDF C3-5    PT Comments    Discharge plan updated to HHPT. Patient has progressed extremely well towards his goals. Ambulating 200 feet with Rollator and supervision. Also able to negotiate 5 steps to prepare for transition home. Will need continued therapy services to progress activity tolerance and functional strengthening.     Follow Up Recommendations  Home health PT;Supervision for mobility/OOB     Equipment Recommendations  3in1 (PT)    Recommendations for Other Services       Precautions / Restrictions Precautions Precautions: Fall Restrictions Weight Bearing Restrictions: No    Mobility  Bed Mobility               General bed mobility comments: OOB in recliner  Transfers Overall transfer level: Needs assistance Equipment used: Rolling walker (2 wheeled) Transfers: Sit to/from Stand Sit to Stand: Supervision         General transfer comment: Supervision for safety. Min cueing for locking Rollator prior to sitting  Ambulation/Gait Ambulation/Gait assistance: Supervision Gait Distance (Feet): 200 Feet Assistive device: 4-wheeled walker Gait Pattern/deviations: Step-through pattern;Decreased dorsiflexion - right;Decreased dorsiflexion - left Gait velocity: decreased   General Gait Details: Patient with slowed gait speed but no overt loss of balance. Cues for hip extension and self pacing. One mild right knee buckle  upon return to room but patient did not require additional assistance to correct. Utilized one standing rest break.    Stairs Stairs: Yes Stairs assistance: Supervision Stair Management: Two rails;Forwards;Sideways;One rail Right Number of Stairs: 3 General stair comments: Patient ascended forwards with bilateral rails and descended sidways with bilateral hand support on single railing to simulate home environment. Performed with supervision. Wife present for guarding technique.   Wheelchair Mobility    Modified Rankin (Stroke Patients Only)       Balance Overall balance assessment: Needs assistance Sitting-balance support: Feet supported;Bilateral upper extremity supported Sitting balance-Leahy Scale: Good     Standing balance support: Single extremity supported;During functional activity Standing balance-Leahy Scale: Fair                              Cognition Arousal/Alertness: Awake/alert Behavior During Therapy: WFL for tasks assessed/performed Overall Cognitive Status: Within Functional Limits for tasks assessed                                        Exercises General Exercises - Lower Extremity Long Arc Quad: Both;10 reps;Seated    General Comments        Pertinent Vitals/Pain Pain Assessment: No/denies pain    Home Living                      Prior Function            PT Goals (current goals can now be found in  the care plan section) Acute Rehab PT Goals Patient Stated Goal: To eat lunch  PT Goal Formulation: With patient Time For Goal Achievement: 12/08/17 Potential to Achieve Goals: Good Progress towards PT goals: Progressing toward goals    Frequency    Min 4X/week      PT Plan Discharge plan needs to be updated    Co-evaluation              AM-PAC PT "6 Clicks" Daily Activity  Outcome Measure  Difficulty turning over in bed (including adjusting bedclothes, sheets and blankets)?: A  Little Difficulty moving from lying on back to sitting on the side of the bed? : A Little Difficulty sitting down on and standing up from a chair with arms (e.g., wheelchair, bedside commode, etc,.)?: A Little Help needed moving to and from a bed to chair (including a wheelchair)?: A Little Help needed walking in hospital room?: A Little Help needed climbing 3-5 steps with a railing? : A Little 6 Click Score: 18    End of Session Equipment Utilized During Treatment: Gait belt Activity Tolerance: Patient tolerated treatment well;Patient limited by fatigue Patient left: in chair;with call bell/phone within reach;with chair alarm set;with nursing/sitter in room Nurse Communication: Mobility status PT Visit Diagnosis: Unsteadiness on feet (R26.81);Other symptoms and signs involving the nervous system (R29.898);Difficulty in walking, not elsewhere classified (R26.2)     Time: 9532-0233 PT Time Calculation (min) (ACUTE ONLY): 26 min  Charges:  $Gait Training: 8-22 mins $Therapeutic Activity: 8-22 mins                    G Codes:       Ellamae Sia, PT, DPT Acute Rehabilitation Services  Pager: [phone]    Willy Eddy 11/26/2017, 12:32 PM

## 2017-11-26 NOTE — Clinical Social Work Note (Signed)
Clinical Social Work Assessment  Patient Details  Name: Ronald Rice MRN: [id number] Date of Birth: June 05, 1945  Date of referral:  11/26/17               Reason for consult:  Facility Placement, Discharge Planning                Permission sought to share information with:  Family Supports Permission granted to share information::  Yes, Verbal Permission Granted  Name::        Agency::     Relationship::  Wife  Contact Information:     Housing/Transportation Living arrangements for the past 2 months:  Single Family Home Source of Information:  Patient, Medical Team, Spouse Patient Interpreter Needed:  None Criminal Activity/Legal Involvement Pertinent to Current Situation/Hospitalization:  No - Comment as needed Significant Relationships:  Spouse Lives with:  Spouse Do you feel safe going back to the place where you live?  No Need for family participation in patient care:  Yes (Comment)  Care giving concerns:  PT recommending CIR once medically stable for discharge. CSW initiating SNF backup.   Social Worker assessment / plan:  CSW met with patient. Wife at bedside. CSW introduced role and explained that PT recommendations would be discussed. Discussed potential CIR barriers. Patient and his wife plan on returning home with home health at discharge. They stated MD is already aware. Patient was not set up with an agency prior to admission. CSW left message for RNCM. No further concerns. CSW signing off as social work intervention is no longer needed.  Employment status:  Retired Forensic scientist:  Other (Comment Required)([employer]) PT Recommendations:  Inpatient Rehab Consult Information / Referral to community resources:  [place]  Patient/Family's Response to care:  Patient and his wife prefer HHPT. Patient's wife supportive and involved in patient's care. Patient and his wife appreciated social work intervention.  Patient/Family's Understanding of and Emotional  Response to Diagnosis, Current Treatment, and Prognosis:  Patient and his wife have a good understanding of the reason for admission and his need for therapy after discharge. Patient and his wife appear happy with hospital care.  Emotional Assessment Appearance:  Appears stated age Attitude/Demeanor/Rapport:  Engaged, Gracious Affect (typically observed):  Appropriate, Calm, Pleasant Orientation:  Oriented to Self, Oriented to Place, Oriented to  Time, Oriented to Situation Alcohol / Substance use:  Never Used Psych involvement (Current and /or in the community):  No (Comment)  Discharge Needs  Concerns to be addressed:  Care Coordination Readmission within the last 30 days:  No Current discharge risk:  Dependent with Mobility Barriers to Discharge:  Continued Medical Work up   Candie Chroman, LCSW 11/26/2017, 11:22 AM

## 2017-11-26 NOTE — Discharge Summary (Addendum)
Ronald Rice KKX:[id number] DOB: [birth year] DOA: 11/22/2017  PCP: Deland Pretty, MD  Admit date: 11/22/2017  Discharge date: 11/26/2017  Admitted From:  Home   Disposition:  Home   Recommendations for Outpatient Follow-up:   Follow up with PCP in 1-2 weeks  PCP Please obtain BMP/CBC, 2 view CXR in 1week,  (see Discharge instructions)   PCP Please follow up on the following pending results:    Home Health: PT, RN  Equipment/Devices: None  Consultations: Neuro, PCCM Discharge Condition: Stable   CODE STATUS: Full   Diet Recommendation: Low Carb Heart Healthy     Chief Complaint  Patient presents with  . Code Stroke     Brief history of present illness from the day of admission and additional interim summary    72 year old male with a recent episode of bronchitis and recently requiring long-term catheterization for bladder outlet obstruction presented to our facility with generalized weakness on July 2.  Treated for stroke but MRI brain showed no evidence of that.    Work-up showed that he had fever, bacteremia caused by urosepsis.                                                                 Hospital Course    1.  Sepsis with Klebsiella bacteremia caused by UTI.  Likely due to recent outpatient Foley catheterization few weeks prior to admission, treated with IV antibiotics and now transition to 10 days of oral Vantin, sepsis physiology has completely resolved, he is afebrile without any leukocytosis back to his baseline will be discharged home with home PT, continue to use home walker.  No signs of CVA.   Addendum 11/28/17 - Final culture report back 11/27/17 - Bacteremia is Psudomonas was called by Pharm, patient called doing well, switched to Cipro x 10 days.   2.  Patient for CVA requiring TPA in the ER.  MRI  normal, CVA/TIA ruled out.  Continue statin for dyslipidemia secondary prevention.  Chronic lower extremity weakness due to multiple back surgeries no acute issues, uses rolling walker at home which she will continue to do.  Home PT ordered for chronic weakness.  3.  Dyslipidemia.  Continue statin.  4.  Renal cyst found on CT scan.  Outpatient follow-up with urology, patient requested to discuss his CT findings with his urologist next admission this will need to be monitored.  5.  Essential hypertension.  Continue home medications unchanged he is already on combination of Norvasc along with beta-blocker.  6.  BPH with intermittent bladder outlet obstruction.  Continue Flomax follow with urologist within a week and also discussed your CT scan findings which suggest you have a renal cyst.  The patient instructed in writing to do so.   Discharge diagnosis     Active Problems:  Kidney cyst, acquired   Ischemic stroke ([place])   Fever   Acute cystitis with hematuria    Discharge instructions    Discharge Instructions    Discharge instructions   Complete by:  As directed    Follow with Primary MD Deland Pretty, MD and your urologist in 7 days, review your abdominal CT results with your urologist.  You have a kidney cyst.  Get CBC, CMP, checked  by Primary MD or SNF MD in 5-7 days    Activity: As tolerated with Full fall precautions use walker/cane & assistance as needed  Disposition Home   Diet:   Low Carb - Heart Healthy   For Heart failure patients - Check your Weight same time everyday, if you gain over 2 pounds, or you develop in leg swelling, experience more shortness of breath or chest pain, call your Primary MD immediately. Follow Cardiac Low Salt Diet and 1.5 lit/day fluid restriction.  Special Instructions: If you have smoked or chewed Tobacco  in the last 2 yrs please stop smoking, stop any regular Alcohol  and or any Recreational drug use.  On your next visit with your  primary care physician please Get Medicines reviewed and adjusted.  Please request your Prim.MD to go over all Hospital Tests and Procedure/Radiological results at the follow up, please get all Hospital records sent to your Prim MD by signing hospital release before you go home.  If you experience worsening of your admission symptoms, develop shortness of breath, life threatening emergency, suicidal or homicidal thoughts you must seek medical attention immediately by calling 911 or calling your MD immediately  if symptoms less severe.  You Must read complete instructions/literature along with all the possible adverse reactions/side effects for all the Medicines you take and that have been prescribed to you. Take any new Medicines after you have completely understood and accpet all the possible adverse reactions/side effects.   Increase activity slowly   Complete by:  As directed       Discharge Medications   Allergies as of 11/26/2017   No Known Allergies     Medication List    STOP taking these medications   cephALEXin 500 MG capsule Commonly known as:  Eunice these medications   4-WAY NASAL SPRAY NA Place 1 spray into both nostrils as needed (for allergies).   allopurinol 300 MG tablet Commonly known as:  ZYLOPRIM Take 300 mg by mouth daily.   amLODipine 5 MG tablet Commonly known as:  NORVASC Take 5 mg by mouth daily.   aspirin EC 81 MG tablet Take 81 mg by mouth daily.   atenolol 25 MG tablet Commonly known as:  TENORMIN Take 25 mg by mouth daily.   atorvastatin 40 MG tablet Commonly known as:  LIPITOR Take 40 mg by mouth daily.   cefpodoxime 200 MG tablet Commonly known as:  VANTIN Take 1 tablet (200 mg total) by mouth 2 (two) times daily.   CENTRUM SILVER 50+MEN PO Take 1 tablet by mouth daily.   CIALIS 20 MG tablet Generic drug:  tadalafil Take 20 mg by mouth as needed for erectile dysfunction.   Cinnamon 500 MG Tabs Take 500 mg by mouth  daily.   famotidine 20 MG tablet Commonly known as:  PEPCID Take 20 mg by mouth at bedtime.   gabapentin 300 MG capsule Commonly known as:  NEURONTIN Take 1 capsule (300 mg total) by mouth at bedtime.   Ginseng Complex/Royal Jelly 800-200 MG  Caps Take 1 capsule by mouth daily.   ketoconazole 2 % cream Commonly known as:  NIZORAL Apply 1 application topically as needed for irritation.   lisinopril-hydrochlorothiazide 20-25 MG tablet Commonly known as:  PRINZIDE,ZESTORETIC Take 2 tablets by mouth daily.   naproxen sodium 220 MG tablet Commonly known as:  ALEVE Take 220 mg by mouth 2 (two) times daily as needed (PAIN).   NON FORMULARY Take 1 tablet by mouth daily. Blood Sugar Defense   potassium chloride 10 MEQ tablet Commonly known as:  K-DUR Take 10 mEq by mouth daily.   tamsulosin 0.4 MG Caps capsule Commonly known as:  FLOMAX Take 0.4 mg by mouth daily.   tiZANidine 4 MG tablet Commonly known as:  ZANAFLEX Take 4 mg by mouth every 12 (twelve) hours as needed for muscle spasms.       Follow-up Information    Deland Pretty, MD. Schedule an appointment as soon as possible for a visit in 1 week(s).   Specialty:  Internal Medicine Why:  And your urologist within a week Contact information: [address] [place] [address] [phone]           Major procedures and Radiology Reports - PLEASE review detailed and final reports thoroughly  -       Ct Angio Head W Or Wo Contrast  Result Date: 11/22/2017 CLINICAL DATA:  Initial evaluation for acute dizziness, confusion. EXAM: CT ANGIOGRAPHY HEAD AND NECK TECHNIQUE: Multidetector CT imaging of the head and neck was performed using the standard protocol during bolus administration of intravenous contrast. Multiplanar CT image reconstructions and MIPs were obtained to evaluate the vascular anatomy. Carotid stenosis measurements (when applicable) are obtained utilizing NASCET criteria, using the  distal internal carotid diameter as the denominator. CONTRAST:  57mL ISOVUE-370 IOPAMIDOL (ISOVUE-370) INJECTION 76% COMPARISON:  Prior CT and MRI from earlier the same day. FINDINGS: CTA NECK FINDINGS Aortic arch: Visualized aortic arch of normal caliber with normal 3 vessel morphology. No flow-limiting stenosis about the origin of the great vessels. Visualized subclavian arteries widely patent. Right carotid system: Right common and internal carotid arteries are widely patent without stenosis, dissection, or occlusion. No atheromatous narrowing about the right carotid bifurcation. Left carotid system: Left common and internal carotid arteries widely patent without stenosis, dissection, or occlusion. No atheromatous narrowing about the left carotid bifurcation. Vertebral arteries: Both of the vertebral arteries arise from the subclavian arteries. Vertebral arteries patent within the neck without stenosis, dissection, or occlusion. Evaluation the vertebral arteries mildly limited at the C3 through C5 levels due to adjacent ACDF. Skeleton: No acute osseous abnormality. No worrisome lytic or blastic osseous lesions. Patient is status post prior cervical fusion at C3 through C7. Other neck: No acute soft tissue abnormality within the neck. 17 mm hypodense right thyroid nodule, indeterminate. Upper chest: Visualized upper chest demonstrates no acute finding. Scattered atelectatic changes noted dependently within the visualized lungs. Visualized lungs are otherwise clear. Review of the MIP images confirms the above findings CTA HEAD FINDINGS Anterior circulation: Internal carotid arteries patent to the termini without stenosis. Minimal plaque within the cavernous ICAs bilaterally. A1 segments patent bilaterally. Normal anterior communicating artery. Azygos ACA, patent to its distal aspect. M1 segments patent without stenosis or occlusion. No proximal M2 occlusion. Distal MCA branches well perfused and symmetric.  Posterior circulation: Vertebral arteries patent to the vertebrobasilar junction without stenosis. Left vertebral artery slightly dominant. Patent left PICA. Right PICA not well visualized. Basilar widely patent to its distal  aspect. Superior cerebellar and posterior cerebral arteries widely patent bilaterally. Venous sinuses: Patent. Anatomic variants: Azygos ACA.  No intracranial aneurysm. Delayed phase: No abnormal enhancement. Review of the MIP images confirms the above findings IMPRESSION: 1. Negative CTA with no evidence for large vessel occlusion. No hemodynamically significant or correctable stenosis identified. 2. Azygos ACA. 3. 1.7 cm right thyroid nodule, indeterminate. Follow-up examination with dedicated thyroid ultrasound recommended for further characterization. Electronically Signed   By: Jeannine Boga M.D.   On: 11/22/2017 22:14   Ct Head Wo Contrast  Result Date: 11/23/2017 CLINICAL DATA:  24 hour tPA follow-up EXAM: CT HEAD WITHOUT CONTRAST TECHNIQUE: Contiguous axial images were obtained from the base of the skull through the vertex without intravenous contrast. COMPARISON:  Head CT 11/22/2017 FINDINGS: Brain: There is no mass, hemorrhage or extra-axial collection. The size and configuration of the ventricles and extra-axial CSF spaces are normal. There is no acute or chronic infarction. The brain parenchyma is normal. Vascular: No abnormal hyperdensity of the major intracranial arteries or dural venous sinuses. No intracranial atherosclerosis. Skull: The visualized skull base, calvarium and extracranial soft tissues are normal. Sinuses/Orbits: No fluid levels or advanced mucosal thickening of the visualized paranasal sinuses. No mastoid or middle ear effusion. The orbits are normal. IMPRESSION: No intracranial hemorrhage. Electronically Signed   By: Ulyses Jarred M.D.   On: 11/23/2017 20:12   Ct Angio Neck W And/or Wo Contrast  Result Date: 11/22/2017 CLINICAL DATA:  Initial  evaluation for acute dizziness, confusion. EXAM: CT ANGIOGRAPHY HEAD AND NECK TECHNIQUE: Multidetector CT imaging of the head and neck was performed using the standard protocol during bolus administration of intravenous contrast. Multiplanar CT image reconstructions and MIPs were obtained to evaluate the vascular anatomy. Carotid stenosis measurements (when applicable) are obtained utilizing NASCET criteria, using the distal internal carotid diameter as the denominator. CONTRAST:  49mL ISOVUE-370 IOPAMIDOL (ISOVUE-370) INJECTION 76% COMPARISON:  Prior CT and MRI from earlier the same day. FINDINGS: CTA NECK FINDINGS Aortic arch: Visualized aortic arch of normal caliber with normal 3 vessel morphology. No flow-limiting stenosis about the origin of the great vessels. Visualized subclavian arteries widely patent. Right carotid system: Right common and internal carotid arteries are widely patent without stenosis, dissection, or occlusion. No atheromatous narrowing about the right carotid bifurcation. Left carotid system: Left common and internal carotid arteries widely patent without stenosis, dissection, or occlusion. No atheromatous narrowing about the left carotid bifurcation. Vertebral arteries: Both of the vertebral arteries arise from the subclavian arteries. Vertebral arteries patent within the neck without stenosis, dissection, or occlusion. Evaluation the vertebral arteries mildly limited at the C3 through C5 levels due to adjacent ACDF. Skeleton: No acute osseous abnormality. No worrisome lytic or blastic osseous lesions. Patient is status post prior cervical fusion at C3 through C7. Other neck: No acute soft tissue abnormality within the neck. 17 mm hypodense right thyroid nodule, indeterminate. Upper chest: Visualized upper chest demonstrates no acute finding. Scattered atelectatic changes noted dependently within the visualized lungs. Visualized lungs are otherwise clear. Review of the MIP images confirms  the above findings CTA HEAD FINDINGS Anterior circulation: Internal carotid arteries patent to the termini without stenosis. Minimal plaque within the cavernous ICAs bilaterally. A1 segments patent bilaterally. Normal anterior communicating artery. Azygos ACA, patent to its distal aspect. M1 segments patent without stenosis or occlusion. No proximal M2 occlusion. Distal MCA branches well perfused and symmetric. Posterior circulation: Vertebral arteries patent to the vertebrobasilar junction without stenosis. Left vertebral artery slightly dominant.  Patent left PICA. Right PICA not well visualized. Basilar widely patent to its distal aspect. Superior cerebellar and posterior cerebral arteries widely patent bilaterally. Venous sinuses: Patent. Anatomic variants: Azygos ACA.  No intracranial aneurysm. Delayed phase: No abnormal enhancement. Review of the MIP images confirms the above findings IMPRESSION: 1. Negative CTA with no evidence for large vessel occlusion. No hemodynamically significant or correctable stenosis identified. 2. Azygos ACA. 3. 1.7 cm right thyroid nodule, indeterminate. Follow-up examination with dedicated thyroid ultrasound recommended for further characterization. Electronically Signed   By: Jeannine Boga M.D.   On: 11/22/2017 22:14   Mr Brain Wo Contrast  Result Date: 11/22/2017 CLINICAL DATA:  Ataxia.  Difficulty speaking.  Given tPA. EXAM: MRI HEAD WITHOUT CONTRAST TECHNIQUE: Multiplanar, multiecho pulse sequences of the brain and surrounding structures were obtained without intravenous contrast. COMPARISON:  Head CT 11/22/2017 FINDINGS: An abbreviated protocol was performed at the direction of the attending stroke neurologist. Axial and coronal diffusion, sagittal T1, and axial FLAIR sequences were obtained Brain: There is no evidence of acute infarct, intracranial hemorrhage, mass, midline shift, or extra-axial fluid collection. Mild generalized cerebral atrophy is within normal  limits for age. No significant cerebral white matter disease is seen. Vascular: Limited assessment on this abbreviated MRI. Skull and upper cervical spine: No suspicious marrow lesion. Prior anterior cervical fusion. Sinuses/Orbits: No acute findings. Other: None. IMPRESSION: Abbreviated brain MRI without evidence of acute infarct or other significant intracranial abnormality. Electronically Signed   By: Logan Bores M.D.   On: 11/22/2017 20:47   US Renal  Result Date: 11/25/2017 CLINICAL DATA:  Sepsis.  Hematuria and dysuria. EXAM: RENAL / URINARY TRACT ULTRASOUND COMPLETE COMPARISON:  MRI of the lumbar spine June 25, 2017 FINDINGS: Right Kidney: Length: 10.2 cm. There is a cyst in the upper pole measuring 6 x 5 x 7 cm with a thin septation. Two hypoechoic masses are seen in the midpole measuring up to 2.7 cm. These are not definitively cystic based on ultrasound images provided. No hydronephrosis in the right kidney. Left Kidney: A normal left kidney is not identified. Multiple large cysts are seen in the left renal fossa, possibly replacing the left kidney but not specific. Bladder: Appears normal for degree of bladder distention. IMPRESSION: 1. Multiple cysts are seen in the left side of the abdomen/renal fossa. These are nonspecific but could represent cysts replacing the left kidney. 2. No acute abnormality in the right kidney. There is at least 1 cyst on the right. Two other hypoechoic masses may represent complicated cysts but are not specific on provided images. Recommend MRI of the abdomen to further assess the cystic mass on the left and the hypoechoic masses in the right kidney. Electronically Signed   By: Dorise Bullion III M.D   On: 11/25/2017 09:57   Dg Chest Port 1 View  Result Date: 11/24/2017 CLINICAL DATA:  Fevers EXAM: PORTABLE CHEST 1 VIEW COMPARISON:  04/18/2017 FINDINGS: Cardiac shadow is within normal limits. Lungs are well aerated bilaterally but hypoinflated. No focal infiltrate  or sizable effusion is seen. No bony abnormality is noted. IMPRESSION: No active disease. Electronically Signed   By: Inez Catalina M.D.   On: 11/24/2017 07:50   Ct Head Code Stroke Wo Contrast  Result Date: 11/22/2017 CLINICAL DATA:  Code stroke. Dizziness, confusion, nausea, and vomiting. EXAM: CT HEAD WITHOUT CONTRAST TECHNIQUE: Contiguous axial images were obtained from the base of the skull through the vertex without intravenous contrast. COMPARISON:  05/22/2016 FINDINGS: Brain: There is  no evidence of acute infarct, intracranial hemorrhage, mass, midline shift, or extra-axial fluid collection. Mild generalized cerebral atrophy is not greater than expected for age. Vascular: Calcified atherosclerosis at the skull base. No hyperdense vessel. Skull: No fracture or focal osseous lesion. Sinuses/Orbits: Visualized paranasal sinuses and mastoid air cells are clear. Orbits are unremarkable. Other: Numerous dermal calcifications at the vertex. ASPECTS [facility] Stroke Program Early CT Score) Not scored with this history. IMPRESSION: No evidence of acute intracranial abnormality. These results were communicated to Dr. Lorraine Lax at 7:15 pm on 11/22/2017 by text page via the [facility] messaging system. Electronically Signed   By: Logan Bores M.D.   On: 11/22/2017 19:15    Micro Results     Recent Results (from the past 240 hour(s))  MRSA PCR Screening     Status: None   Collection Time: 11/22/17 10:39 PM  Result Value Ref Range Status   MRSA by PCR NEGATIVE NEGATIVE Final    Comment:        The GeneXpert MRSA Assay (FDA approved for NASAL specimens only), is one component of a comprehensive MRSA colonization surveillance program. It is not intended to diagnose MRSA infection nor to guide or monitor treatment for MRSA infections. Performed at [facility], [place] [address], [place], [place] [address]   Culture, blood (Routine X 2) w Reflex to ID Panel     Status: Abnormal (Preliminary result)    Collection Time: 11/24/17  7:01 AM  Result Value Ref Range Status   Specimen Description BLOOD RIGHT ARM  Final   Special Requests   Final    BOTTLES DRAWN AEROBIC ONLY Blood Culture adequate volume   Culture  Setup Time   Final    AEROBIC BOTTLE ONLY GRAM NEGATIVE RODS CRITICAL VALUE NOTED.  VALUE IS CONSISTENT WITH PREVIOUSLY REPORTED AND CALLED VALUE.    Culture PSEUDOMONAS AERUGINOSA (A)  Final   Report Status PENDING  Incomplete  Culture, blood (Routine X 2) w Reflex to ID Panel     Status: Abnormal (Preliminary result)   Collection Time: 11/24/17  7:01 AM  Result Value Ref Range Status   Specimen Description BLOOD RIGHT ARM  Final   Special Requests   Final    BOTTLES DRAWN AEROBIC ONLY Blood Culture adequate volume   Culture  Setup Time   Final    GRAM NEGATIVE RODS AEROBIC BOTTLE ONLY CRITICAL RESULT CALLED TO, READ BACK BY AND VERIFIED WITH: L BAJBUS PHARMD 11/25/17 0034 JDW    Culture (A)  Final    PSEUDOMONAS AERUGINOSA SUSCEPTIBILITIES TO FOLLOW Performed at [facility], [place] [address] [place],  [address]    Report Status PENDING  Incomplete  Blood Culture ID Panel (Reflexed)     Status: Abnormal   Collection Time: 11/24/17  7:01 AM  Result Value Ref Range Status   Enterococcus species NOT DETECTED NOT DETECTED Final   Listeria monocytogenes NOT DETECTED NOT DETECTED Final   Staphylococcus species NOT DETECTED NOT DETECTED Final   Staphylococcus aureus NOT DETECTED NOT DETECTED Final   Streptococcus species NOT DETECTED NOT DETECTED Final   Streptococcus agalactiae NOT DETECTED NOT DETECTED Final   Streptococcus pneumoniae NOT DETECTED NOT DETECTED Final   Streptococcus pyogenes NOT DETECTED NOT DETECTED Final   Acinetobacter baumannii NOT DETECTED NOT DETECTED Final   Enterobacteriaceae species NOT DETECTED NOT DETECTED Final   Enterobacter cloacae complex NOT DETECTED NOT DETECTED Final   Escherichia coli NOT DETECTED NOT DETECTED Final  Klebsiella oxytoca NOT DETECTED NOT DETECTED Final   Klebsiella pneumoniae NOT DETECTED NOT DETECTED Final   Proteus species NOT DETECTED NOT DETECTED Final   Serratia marcescens NOT DETECTED NOT DETECTED Final   Carbapenem resistance NOT DETECTED NOT DETECTED Final   Haemophilus influenzae NOT DETECTED NOT DETECTED Final   Neisseria meningitidis NOT DETECTED NOT DETECTED Final   Pseudomonas aeruginosa DETECTED (A) NOT DETECTED Final    Comment: CRITICAL RESULT CALLED TO, READ BACK BY AND VERIFIED WITH: L BAJBUS PHARMD 11/25/17 0034 JDW    Candida albicans NOT DETECTED NOT DETECTED Final   Candida glabrata NOT DETECTED NOT DETECTED Final   Candida krusei NOT DETECTED NOT DETECTED Final   Candida parapsilosis NOT DETECTED NOT DETECTED Final   Candida tropicalis NOT DETECTED NOT DETECTED Final  Culture, Urine     Status: Abnormal   Collection Time: 11/24/17 11:52 AM  Result Value Ref Range Status   Specimen Description URINE, CATHETERIZED  Final   Special Requests   Final    in and out Performed at [facility], [place] [address], [place], [place] [address]    Culture 30,000 COLONIES/mL PSEUDOMONAS AERUGINOSA (A)  Final   Report Status 11/26/2017 FINAL  Final   Organism ID, Bacteria PSEUDOMONAS AERUGINOSA (A)  Final      Susceptibility   Pseudomonas aeruginosa - MIC*    CEFTAZIDIME 2 SENSITIVE Sensitive     CIPROFLOXACIN <=0.25 SENSITIVE Sensitive     GENTAMICIN <=1 SENSITIVE Sensitive     IMIPENEM <=0.25 SENSITIVE Sensitive     PIP/TAZO <=4 SENSITIVE Sensitive     CEFEPIME 2 SENSITIVE Sensitive     * 30,000 COLONIES/mL PSEUDOMONAS AERUGINOSA    Today   Subjective    Ronald Rice today has no headache,no chest abdominal pain,no new weakness tingling or numbness, feels much better wants to go home today.     Objective   Blood pressure 129/80, pulse 69, temperature 97.8 F (36.6 C), temperature source Oral, resp. rate 20, height 6\' 2"  (1.88 m), weight 125.6 kg (276 lb  14.4 oz), SpO2 93 %.   Intake/Output Summary (Last 24 hours) at 11/26/2017 1151 Last data filed at 11/26/2017 0915 Gross per 24 hour  Intake 1644.3 ml  Output 1600 ml  Net 44.3 ml    Exam  Awake Alert, Oriented x 3, No new F.N deficits, Normal affect Sharkey.AT,PERRAL Supple Neck,No JVD, No cervical lymphadenopathy appriciated.  Symmetrical Chest wall movement, Good air movement bilaterally, CTAB RRR,No Gallops,Rubs or new Murmurs, No Parasternal Heave +ve B.Sounds, Abd Soft, Non tender, No organomegaly appriciated, No rebound -guarding or rigidity. No Cyanosis, Clubbing or edema, No new Rash or bruise   Data Review   CBC w Diff:  Lab Results  Component Value Date   WBC 6.5 11/26/2017   HGB 13.1 11/26/2017   HGB 15.1 09/28/2017   HGB 15.7 01/25/2017   HCT 36.8 (L) 11/26/2017   HCT 43.1 01/25/2017   PLT 154 11/26/2017   PLT 164 09/28/2017   PLT 181 01/25/2017   LYMPHOPCT 17 11/26/2017   LYMPHOPCT 27.9 01/25/2017   MONOPCT 18 11/26/2017   MONOPCT 14.6 (H) 01/25/2017   EOSPCT 2 11/26/2017   EOSPCT 2.7 01/25/2017   BASOPCT 0 11/26/2017   BASOPCT 3.8 (H) 01/25/2017    CMP:  Lab Results  Component Value Date   NA 138 11/26/2017   NA 142 01/25/2017   K 3.2 (L) 11/26/2017   K 4.1 01/25/2017   CL 101 11/26/2017  CL 102 01/25/2017   CO2 27 11/26/2017   CO2 33 01/25/2017   BUN 11 11/26/2017   BUN 12 01/25/2017   CREATININE 1.14 11/26/2017   CREATININE 1.30 (H) 09/28/2017   CREATININE 1.4 (H) 01/25/2017   PROT 6.2 (L) 11/24/2017   PROT 7.2 01/25/2017   ALBUMIN 3.3 (L) 11/24/2017   ALBUMIN 4.2 01/25/2017   BILITOT 1.7 (H) 11/24/2017   BILITOT 1.1 09/28/2017   ALKPHOS 60 11/24/2017   ALKPHOS 59 01/25/2017   AST 23 11/24/2017   AST 31 09/28/2017   ALT 18 11/24/2017   ALT 39 09/28/2017   ALT 32 01/25/2017  .   Total Time in preparing paper work, data evaluation and todays exam - 42 minutes  Lala Lund M.D on 11/26/2017 at 11:51 AM  [facility]    Office  [phone]

## 2017-11-26 NOTE — Care Management Note (Signed)
Case Management Note  Patient Details  Name: Ronald Rice MRN: [id number] Date of Birth: 01-13-1946  Subjective/Objective:                Spoke w patient's wife. She would like to [facility] for [facility]. She also would like 3/1, order placed. Referrals accepted. 3/1 will be delivered to room prior to DC.     Action/Plan:   Expected Discharge Date:  11/26/17               Expected Discharge Plan:  [place]  In-House Referral:     Discharge planning Services  CM Consult  Post Acute Care Choice:  Home Health, Durable Medical Equipment Choice offered to:  Spouse  DME Arranged:  3-N-1 DME Agency:  [place]:  PT, RN [facility] Agency:  [place]  Status of Service:  Completed, signed off  If discussed at [place] of Stay Meetings, dates discussed:    Additional Comments:  Carles Collet, RN 11/26/2017, 12:47 PM

## 2017-11-26 NOTE — Progress Notes (Signed)
Occupational Therapy Treatment Patient Details Name: Ronald Rice MRN: [id number] DOB: 26-Nov-1945 Today's Date: 11/26/2017    History of present illness This 72 y.o. male admitted with generalized weakness and unsteady gait.  Head CT negative for acute infarct, and he received tPA due to presumed ischemic CVA.  MRI with no acute infarct.  He has remained confused and developed fever, tachycardia, and tachypneic.   Dx:  Likely sepsis due to UTI, acute encephalopathy.  PMH includes:  CLL, CAD, DM, GERD, HTN, recent bladder outlet obstruction., c/p ACDF C3-5   OT comments  Pt progressing towards established OT goals. Pt performing ADLs and functional mobility with Min Guard A for safety in standing. Pt requiring Min VCs for problem solving and safety; educating wife on continued cognitive deficits and safety. Pt planning for dc later today and answering all questions in preparation. Update dc recommendation to home with HHOT to optimize safety and independence with ADLs and functional mobility.   Follow Up Recommendations  Home health OT;Supervision/Assistance - 24 hour    Equipment Recommendations  3 in 1 bedside commode    Recommendations for Other Services Rehab consult    Precautions / Restrictions Precautions Precautions: Fall Restrictions Weight Bearing Restrictions: No       Mobility Bed Mobility Overal bed mobility: Modified Independent             General bed mobility comments: Increased time  Transfers Overall transfer level: Needs assistance Equipment used: Rolling walker (2 wheeled) Transfers: Sit to/from Stand Sit to Stand: Min guard         General transfer comment: Min Guard for safety    Balance Overall balance assessment: Needs assistance Sitting-balance support: Feet supported;Bilateral upper extremity supported Sitting balance-Leahy Scale: Good     Standing balance support: Single extremity supported;During functional activity Standing  balance-Leahy Scale: Fair                             ADL either performed or assessed with clinical judgement   ADL Overall ADL's : Needs assistance/impaired     Grooming: Sitting;Applying deodorant;Set up   Upper Body Bathing: Set up;Supervision/ safety;Sitting Upper Body Bathing Details (indicate cue type and reason): Performed bathing while seated Lower Body Bathing: Sit to/from stand;Min guard Lower Body Bathing Details (indicate cue type and reason): MIn Guard A for safety in standing Upper Body Dressing : Sitting;Set up;Supervision/safety Upper Body Dressing Details (indicate cue type and reason): Donning shirt while seated Lower Body Dressing: Sit to/from stand;Min guard Lower Body Dressing Details (indicate cue type and reason): Pt donning underwear, socks, and pants with Min Guard for safety in standing             Functional mobility during ADLs: Min guard;Rolling walker General ADL Comments: Pt with increased functional performance and near baseline function. Pt performing bathing and dressing with NMin Guard A for safety. Cues for problem solving and safety     Vision       Perception     Praxis      Cognition Arousal/Alertness: Awake/alert Behavior During Therapy: WFL for tasks assessed/performed Overall Cognitive Status: Impaired/Different from baseline Area of Impairment: Safety/judgement;Problem solving                         Safety/Judgement: Decreased awareness of safety   Problem Solving: Slow processing;Decreased initiation;Difficulty sequencing;Requires verbal cues;Requires tactile cues General Comments: Requiring Min verbal cues to problem  solve safety during bathing and dressing.         Exercises    Shoulder Instructions       General Comments Wife present throughout session    Pertinent Vitals/ Pain       Pain Assessment: No/denies pain  Home Living Family/patient expects to be discharged to:: Private  residence Living Arrangements: Spouse/significant other                                      Prior Functioning/Environment              Frequency  Min 2X/week        Progress Toward Goals  OT Goals(current goals can now be found in the care plan section)  Progress towards OT goals: Progressing toward goals  Acute Rehab OT Goals Patient Stated Goal: To eat lunch  OT Goal Formulation: With patient/family Time For Goal Achievement: 12/08/17 Potential to Achieve Goals: Good ADL Goals Pt Will Perform Eating: Independently;sitting Pt Will Perform Grooming: with min assist;standing Pt Will Perform Upper Body Bathing: with set-up;sitting Pt Will Perform Lower Body Bathing: with min assist;sit to/from stand Pt Will Perform Upper Body Dressing: with set-up;sitting Pt Will Perform Lower Body Dressing: with min assist;sit to/from stand Pt Will Transfer to Toilet: with min assist;ambulating;regular height toilet;bedside commode;grab bars Pt Will Perform Toileting - Clothing Manipulation and hygiene: with min assist;sit to/from stand Additional ADL Goal #1: Pt will selectively attend to ADLs tasks with no cues  Plan Discharge plan needs to be updated    Co-evaluation                 AM-PAC PT "6 Clicks" Daily Activity     Outcome Measure   Help from another person eating meals?: None Help from another person taking care of personal grooming?: A Little Help from another person toileting, which includes using toliet, bedpan, or urinal?: A Little Help from another person bathing (including washing, rinsing, drying)?: A Little Help from another person to put on and taking off regular upper body clothing?: A Little Help from another person to put on and taking off regular lower body clothing?: A Little 6 Click Score: 19    End of Session Equipment Utilized During Treatment: Rolling walker  OT Visit Diagnosis: Unsteadiness on feet (R26.81);Cognitive  communication deficit (R41.841) Symptoms and signs involving cognitive functions: Cerebral infarction   Activity Tolerance Patient tolerated treatment well   Patient Left with call bell/phone within reach;with family/visitor present;in chair   Nurse Communication Mobility status        Time: 1610-9604 OT Time Calculation (min): 40 min  Charges: OT General Charges $OT Visit: 1 Visit OT Treatments $Self Care/Home Management : 38-52 mins  Pul[place]io Bareas, OTR/L Acute Rehab Pager: [phone] Office: [place] 11/26/2017, 2:50 PM

## 2017-11-26 NOTE — Clinical Social Work Note (Signed)
CSW acknowledges consult for advanced directives. Please consult chaplain.  Ronald Rice, [place]

## 2017-11-26 NOTE — Discharge Instructions (Signed)
Follow with Primary MD Deland Pretty, MD and your urologist in 7 days, review your abdominal CT results with your urologist.  You have a kidney cyst.  Get CBC, CMP, checked  by Primary MD or SNF MD in 5-7 days    Activity: As tolerated with Full fall precautions use walker/cane & assistance as needed  Disposition Home   Diet:   Low Carb - Heart Healthy   For Heart failure patients - Check your Weight same time everyday, if you gain over 2 pounds, or you develop in leg swelling, experience more shortness of breath or chest pain, call your Primary MD immediately. Follow Cardiac Low Salt Diet and 1.5 lit/day fluid restriction.  Special Instructions: If you have smoked or chewed Tobacco  in the last 2 yrs please stop smoking, stop any regular Alcohol  and or any Recreational drug use.  On your next visit with your primary care physician please Get Medicines reviewed and adjusted.  Please request your Prim.MD to go over all Hospital Tests and Procedure/Radiological results at the follow up, please get all Hospital records sent to your Prim MD by signing hospital release before you go home.  If you experience worsening of your admission symptoms, develop shortness of breath, life threatening emergency, suicidal or homicidal thoughts you must seek medical attention immediately by calling 911 or calling your MD immediately  if symptoms less severe.  You Must read complete instructions/literature along with all the possible adverse reactions/side effects for all the Medicines you take and that have been prescribed to you. Take any new Medicines after you have completely understood and accpet all the possible adverse reactions/side effects.

## 2017-11-27 LAB — CULTURE, BLOOD (ROUTINE X 2)
Special Requests: ADEQUATE
Special Requests: ADEQUATE

## 2017-12-01 DIAGNOSIS — N390 Urinary tract infection, site not specified: Secondary | ICD-10-CM | POA: Diagnosis not present

## 2017-12-01 DIAGNOSIS — C9111 Chronic lymphocytic leukemia of B-cell type in remission: Secondary | ICD-10-CM | POA: Diagnosis not present

## 2017-12-01 DIAGNOSIS — I10 Essential (primary) hypertension: Secondary | ICD-10-CM | POA: Diagnosis not present

## 2017-12-01 DIAGNOSIS — I2510 Atherosclerotic heart disease of native coronary artery without angina pectoris: Secondary | ICD-10-CM | POA: Diagnosis not present

## 2017-12-01 DIAGNOSIS — K219 Gastro-esophageal reflux disease without esophagitis: Secondary | ICD-10-CM | POA: Diagnosis not present

## 2017-12-01 DIAGNOSIS — N400 Enlarged prostate without lower urinary tract symptoms: Secondary | ICD-10-CM | POA: Diagnosis not present

## 2017-12-01 DIAGNOSIS — E1142 Type 2 diabetes mellitus with diabetic polyneuropathy: Secondary | ICD-10-CM | POA: Diagnosis not present

## 2017-12-01 DIAGNOSIS — R32 Unspecified urinary incontinence: Secondary | ICD-10-CM | POA: Diagnosis not present

## 2017-12-01 DIAGNOSIS — E669 Obesity, unspecified: Secondary | ICD-10-CM | POA: Diagnosis not present

## 2017-12-06 DIAGNOSIS — E1142 Type 2 diabetes mellitus with diabetic polyneuropathy: Secondary | ICD-10-CM | POA: Diagnosis not present

## 2017-12-06 DIAGNOSIS — N400 Enlarged prostate without lower urinary tract symptoms: Secondary | ICD-10-CM | POA: Diagnosis not present

## 2017-12-06 DIAGNOSIS — I2510 Atherosclerotic heart disease of native coronary artery without angina pectoris: Secondary | ICD-10-CM | POA: Diagnosis not present

## 2017-12-06 DIAGNOSIS — C9111 Chronic lymphocytic leukemia of B-cell type in remission: Secondary | ICD-10-CM | POA: Diagnosis not present

## 2017-12-06 DIAGNOSIS — I10 Essential (primary) hypertension: Secondary | ICD-10-CM | POA: Diagnosis not present

## 2017-12-06 DIAGNOSIS — N390 Urinary tract infection, site not specified: Secondary | ICD-10-CM | POA: Diagnosis not present

## 2017-12-07 DIAGNOSIS — I10 Essential (primary) hypertension: Secondary | ICD-10-CM | POA: Diagnosis not present

## 2017-12-07 DIAGNOSIS — N390 Urinary tract infection, site not specified: Secondary | ICD-10-CM | POA: Diagnosis not present

## 2017-12-07 DIAGNOSIS — I2510 Atherosclerotic heart disease of native coronary artery without angina pectoris: Secondary | ICD-10-CM | POA: Diagnosis not present

## 2017-12-07 DIAGNOSIS — E1142 Type 2 diabetes mellitus with diabetic polyneuropathy: Secondary | ICD-10-CM | POA: Diagnosis not present

## 2017-12-07 DIAGNOSIS — N400 Enlarged prostate without lower urinary tract symptoms: Secondary | ICD-10-CM | POA: Diagnosis not present

## 2017-12-07 DIAGNOSIS — C9111 Chronic lymphocytic leukemia of B-cell type in remission: Secondary | ICD-10-CM | POA: Diagnosis not present

## 2017-12-08 DIAGNOSIS — E041 Nontoxic single thyroid nodule: Secondary | ICD-10-CM | POA: Diagnosis not present

## 2017-12-08 DIAGNOSIS — A419 Sepsis, unspecified organism: Secondary | ICD-10-CM | POA: Diagnosis not present

## 2017-12-08 DIAGNOSIS — M79652 Pain in left thigh: Secondary | ICD-10-CM | POA: Diagnosis not present

## 2017-12-08 DIAGNOSIS — R269 Unspecified abnormalities of gait and mobility: Secondary | ICD-10-CM | POA: Diagnosis not present

## 2017-12-09 DIAGNOSIS — I2510 Atherosclerotic heart disease of native coronary artery without angina pectoris: Secondary | ICD-10-CM | POA: Diagnosis not present

## 2017-12-09 DIAGNOSIS — N390 Urinary tract infection, site not specified: Secondary | ICD-10-CM | POA: Diagnosis not present

## 2017-12-09 DIAGNOSIS — C9111 Chronic lymphocytic leukemia of B-cell type in remission: Secondary | ICD-10-CM | POA: Diagnosis not present

## 2017-12-09 DIAGNOSIS — I10 Essential (primary) hypertension: Secondary | ICD-10-CM | POA: Diagnosis not present

## 2017-12-09 DIAGNOSIS — N400 Enlarged prostate without lower urinary tract symptoms: Secondary | ICD-10-CM | POA: Diagnosis not present

## 2017-12-09 DIAGNOSIS — E1142 Type 2 diabetes mellitus with diabetic polyneuropathy: Secondary | ICD-10-CM | POA: Diagnosis not present

## 2017-12-12 ENCOUNTER — Other Ambulatory Visit: Payer: Self-pay | Admitting: Internal Medicine

## 2017-12-12 DIAGNOSIS — E041 Nontoxic single thyroid nodule: Secondary | ICD-10-CM

## 2017-12-13 DIAGNOSIS — E1142 Type 2 diabetes mellitus with diabetic polyneuropathy: Secondary | ICD-10-CM | POA: Diagnosis not present

## 2017-12-13 DIAGNOSIS — N390 Urinary tract infection, site not specified: Secondary | ICD-10-CM | POA: Diagnosis not present

## 2017-12-13 DIAGNOSIS — I10 Essential (primary) hypertension: Secondary | ICD-10-CM | POA: Diagnosis not present

## 2017-12-13 DIAGNOSIS — I2510 Atherosclerotic heart disease of native coronary artery without angina pectoris: Secondary | ICD-10-CM | POA: Diagnosis not present

## 2017-12-13 DIAGNOSIS — C9111 Chronic lymphocytic leukemia of B-cell type in remission: Secondary | ICD-10-CM | POA: Diagnosis not present

## 2017-12-13 DIAGNOSIS — N400 Enlarged prostate without lower urinary tract symptoms: Secondary | ICD-10-CM | POA: Diagnosis not present

## 2017-12-14 DIAGNOSIS — E1142 Type 2 diabetes mellitus with diabetic polyneuropathy: Secondary | ICD-10-CM | POA: Diagnosis not present

## 2017-12-14 DIAGNOSIS — C9111 Chronic lymphocytic leukemia of B-cell type in remission: Secondary | ICD-10-CM | POA: Diagnosis not present

## 2017-12-14 DIAGNOSIS — I2510 Atherosclerotic heart disease of native coronary artery without angina pectoris: Secondary | ICD-10-CM | POA: Diagnosis not present

## 2017-12-14 DIAGNOSIS — N390 Urinary tract infection, site not specified: Secondary | ICD-10-CM | POA: Diagnosis not present

## 2017-12-14 DIAGNOSIS — N400 Enlarged prostate without lower urinary tract symptoms: Secondary | ICD-10-CM | POA: Diagnosis not present

## 2017-12-14 DIAGNOSIS — I10 Essential (primary) hypertension: Secondary | ICD-10-CM | POA: Diagnosis not present

## 2017-12-17 DIAGNOSIS — N400 Enlarged prostate without lower urinary tract symptoms: Secondary | ICD-10-CM | POA: Diagnosis not present

## 2017-12-17 DIAGNOSIS — E1142 Type 2 diabetes mellitus with diabetic polyneuropathy: Secondary | ICD-10-CM | POA: Diagnosis not present

## 2017-12-17 DIAGNOSIS — I2510 Atherosclerotic heart disease of native coronary artery without angina pectoris: Secondary | ICD-10-CM | POA: Diagnosis not present

## 2017-12-17 DIAGNOSIS — I10 Essential (primary) hypertension: Secondary | ICD-10-CM | POA: Diagnosis not present

## 2017-12-17 DIAGNOSIS — N390 Urinary tract infection, site not specified: Secondary | ICD-10-CM | POA: Diagnosis not present

## 2017-12-17 DIAGNOSIS — C9111 Chronic lymphocytic leukemia of B-cell type in remission: Secondary | ICD-10-CM | POA: Diagnosis not present

## 2017-12-19 DIAGNOSIS — M549 Dorsalgia, unspecified: Secondary | ICD-10-CM | POA: Diagnosis not present

## 2017-12-19 DIAGNOSIS — M7062 Trochanteric bursitis, left hip: Secondary | ICD-10-CM | POA: Diagnosis not present

## 2017-12-19 DIAGNOSIS — M79604 Pain in right leg: Secondary | ICD-10-CM | POA: Diagnosis not present

## 2017-12-19 DIAGNOSIS — M25552 Pain in left hip: Secondary | ICD-10-CM | POA: Diagnosis not present

## 2017-12-19 DIAGNOSIS — M25511 Pain in right shoulder: Secondary | ICD-10-CM | POA: Diagnosis not present

## 2017-12-19 DIAGNOSIS — M1990 Unspecified osteoarthritis, unspecified site: Secondary | ICD-10-CM | POA: Diagnosis not present

## 2017-12-19 DIAGNOSIS — M7581 Other shoulder lesions, right shoulder: Secondary | ICD-10-CM | POA: Diagnosis not present

## 2017-12-20 ENCOUNTER — Ambulatory Visit
Admission: RE | Admit: 2017-12-20 | Discharge: 2017-12-20 | Disposition: A | Discharge status: Home / Self Care | Payer: 59 | Source: Ambulatory Visit | Attending: Internal Medicine | Admitting: Internal Medicine

## 2017-12-20 DIAGNOSIS — N400 Enlarged prostate without lower urinary tract symptoms: Secondary | ICD-10-CM | POA: Diagnosis not present

## 2017-12-20 DIAGNOSIS — E041 Nontoxic single thyroid nodule: Secondary | ICD-10-CM

## 2017-12-20 DIAGNOSIS — I10 Essential (primary) hypertension: Secondary | ICD-10-CM | POA: Diagnosis not present

## 2017-12-20 DIAGNOSIS — E1142 Type 2 diabetes mellitus with diabetic polyneuropathy: Secondary | ICD-10-CM | POA: Diagnosis not present

## 2017-12-20 DIAGNOSIS — C9111 Chronic lymphocytic leukemia of B-cell type in remission: Secondary | ICD-10-CM | POA: Diagnosis not present

## 2017-12-20 DIAGNOSIS — N390 Urinary tract infection, site not specified: Secondary | ICD-10-CM | POA: Diagnosis not present

## 2017-12-20 DIAGNOSIS — I2510 Atherosclerotic heart disease of native coronary artery without angina pectoris: Secondary | ICD-10-CM | POA: Diagnosis not present

## 2017-12-21 DIAGNOSIS — C9111 Chronic lymphocytic leukemia of B-cell type in remission: Secondary | ICD-10-CM | POA: Diagnosis not present

## 2017-12-21 DIAGNOSIS — I2510 Atherosclerotic heart disease of native coronary artery without angina pectoris: Secondary | ICD-10-CM | POA: Diagnosis not present

## 2017-12-21 DIAGNOSIS — N390 Urinary tract infection, site not specified: Secondary | ICD-10-CM | POA: Diagnosis not present

## 2017-12-21 DIAGNOSIS — E1142 Type 2 diabetes mellitus with diabetic polyneuropathy: Secondary | ICD-10-CM | POA: Diagnosis not present

## 2017-12-21 DIAGNOSIS — I10 Essential (primary) hypertension: Secondary | ICD-10-CM | POA: Diagnosis not present

## 2017-12-21 DIAGNOSIS — N400 Enlarged prostate without lower urinary tract symptoms: Secondary | ICD-10-CM | POA: Diagnosis not present

## 2017-12-27 DIAGNOSIS — Z87440 Personal history of urinary (tract) infections: Secondary | ICD-10-CM | POA: Diagnosis not present

## 2017-12-27 DIAGNOSIS — E1142 Type 2 diabetes mellitus with diabetic polyneuropathy: Secondary | ICD-10-CM | POA: Diagnosis not present

## 2017-12-27 DIAGNOSIS — I2510 Atherosclerotic heart disease of native coronary artery without angina pectoris: Secondary | ICD-10-CM | POA: Diagnosis not present

## 2017-12-27 DIAGNOSIS — R32 Unspecified urinary incontinence: Secondary | ICD-10-CM | POA: Diagnosis not present

## 2017-12-27 DIAGNOSIS — K219 Gastro-esophageal reflux disease without esophagitis: Secondary | ICD-10-CM | POA: Diagnosis not present

## 2017-12-27 DIAGNOSIS — I10 Essential (primary) hypertension: Secondary | ICD-10-CM | POA: Diagnosis not present

## 2017-12-27 DIAGNOSIS — E785 Hyperlipidemia, unspecified: Secondary | ICD-10-CM | POA: Diagnosis not present

## 2017-12-27 DIAGNOSIS — N400 Enlarged prostate without lower urinary tract symptoms: Secondary | ICD-10-CM | POA: Diagnosis not present

## 2017-12-27 DIAGNOSIS — C9111 Chronic lymphocytic leukemia of B-cell type in remission: Secondary | ICD-10-CM | POA: Diagnosis not present

## 2017-12-28 DIAGNOSIS — E1142 Type 2 diabetes mellitus with diabetic polyneuropathy: Secondary | ICD-10-CM | POA: Diagnosis not present

## 2017-12-28 DIAGNOSIS — N400 Enlarged prostate without lower urinary tract symptoms: Secondary | ICD-10-CM | POA: Diagnosis not present

## 2017-12-28 DIAGNOSIS — I10 Essential (primary) hypertension: Secondary | ICD-10-CM | POA: Diagnosis not present

## 2017-12-28 DIAGNOSIS — R32 Unspecified urinary incontinence: Secondary | ICD-10-CM | POA: Diagnosis not present

## 2017-12-28 DIAGNOSIS — C9111 Chronic lymphocytic leukemia of B-cell type in remission: Secondary | ICD-10-CM | POA: Diagnosis not present

## 2017-12-28 DIAGNOSIS — Z87440 Personal history of urinary (tract) infections: Secondary | ICD-10-CM | POA: Diagnosis not present

## 2017-12-28 DIAGNOSIS — I2510 Atherosclerotic heart disease of native coronary artery without angina pectoris: Secondary | ICD-10-CM | POA: Diagnosis not present

## 2017-12-28 DIAGNOSIS — K219 Gastro-esophageal reflux disease without esophagitis: Secondary | ICD-10-CM | POA: Diagnosis not present

## 2017-12-28 DIAGNOSIS — E785 Hyperlipidemia, unspecified: Secondary | ICD-10-CM | POA: Diagnosis not present

## 2018-01-03 DIAGNOSIS — R32 Unspecified urinary incontinence: Secondary | ICD-10-CM | POA: Diagnosis not present

## 2018-01-03 DIAGNOSIS — I2510 Atherosclerotic heart disease of native coronary artery without angina pectoris: Secondary | ICD-10-CM | POA: Diagnosis not present

## 2018-01-03 DIAGNOSIS — E785 Hyperlipidemia, unspecified: Secondary | ICD-10-CM | POA: Diagnosis not present

## 2018-01-03 DIAGNOSIS — Z87440 Personal history of urinary (tract) infections: Secondary | ICD-10-CM | POA: Diagnosis not present

## 2018-01-03 DIAGNOSIS — N400 Enlarged prostate without lower urinary tract symptoms: Secondary | ICD-10-CM | POA: Diagnosis not present

## 2018-01-03 DIAGNOSIS — I10 Essential (primary) hypertension: Secondary | ICD-10-CM | POA: Diagnosis not present

## 2018-01-03 DIAGNOSIS — K219 Gastro-esophageal reflux disease without esophagitis: Secondary | ICD-10-CM | POA: Diagnosis not present

## 2018-01-03 DIAGNOSIS — C9111 Chronic lymphocytic leukemia of B-cell type in remission: Secondary | ICD-10-CM | POA: Diagnosis not present

## 2018-01-03 DIAGNOSIS — E1142 Type 2 diabetes mellitus with diabetic polyneuropathy: Secondary | ICD-10-CM | POA: Diagnosis not present

## 2018-01-10 DIAGNOSIS — C9111 Chronic lymphocytic leukemia of B-cell type in remission: Secondary | ICD-10-CM | POA: Diagnosis not present

## 2018-01-10 DIAGNOSIS — I10 Essential (primary) hypertension: Secondary | ICD-10-CM | POA: Diagnosis not present

## 2018-01-10 DIAGNOSIS — R32 Unspecified urinary incontinence: Secondary | ICD-10-CM | POA: Diagnosis not present

## 2018-01-10 DIAGNOSIS — E1142 Type 2 diabetes mellitus with diabetic polyneuropathy: Secondary | ICD-10-CM | POA: Diagnosis not present

## 2018-01-10 DIAGNOSIS — I2510 Atherosclerotic heart disease of native coronary artery without angina pectoris: Secondary | ICD-10-CM | POA: Diagnosis not present

## 2018-01-10 DIAGNOSIS — N400 Enlarged prostate without lower urinary tract symptoms: Secondary | ICD-10-CM | POA: Diagnosis not present

## 2018-01-10 DIAGNOSIS — K219 Gastro-esophageal reflux disease without esophagitis: Secondary | ICD-10-CM | POA: Diagnosis not present

## 2018-01-10 DIAGNOSIS — E785 Hyperlipidemia, unspecified: Secondary | ICD-10-CM | POA: Diagnosis not present

## 2018-01-10 DIAGNOSIS — Z87440 Personal history of urinary (tract) infections: Secondary | ICD-10-CM | POA: Diagnosis not present

## 2018-01-17 DIAGNOSIS — C9111 Chronic lymphocytic leukemia of B-cell type in remission: Secondary | ICD-10-CM | POA: Diagnosis not present

## 2018-01-17 DIAGNOSIS — I2510 Atherosclerotic heart disease of native coronary artery without angina pectoris: Secondary | ICD-10-CM | POA: Diagnosis not present

## 2018-01-17 DIAGNOSIS — I10 Essential (primary) hypertension: Secondary | ICD-10-CM | POA: Diagnosis not present

## 2018-01-17 DIAGNOSIS — N400 Enlarged prostate without lower urinary tract symptoms: Secondary | ICD-10-CM | POA: Diagnosis not present

## 2018-01-17 DIAGNOSIS — K219 Gastro-esophageal reflux disease without esophagitis: Secondary | ICD-10-CM | POA: Diagnosis not present

## 2018-01-17 DIAGNOSIS — E1142 Type 2 diabetes mellitus with diabetic polyneuropathy: Secondary | ICD-10-CM | POA: Diagnosis not present

## 2018-01-17 DIAGNOSIS — E785 Hyperlipidemia, unspecified: Secondary | ICD-10-CM | POA: Diagnosis not present

## 2018-01-17 DIAGNOSIS — R32 Unspecified urinary incontinence: Secondary | ICD-10-CM | POA: Diagnosis not present

## 2018-01-17 DIAGNOSIS — Z87440 Personal history of urinary (tract) infections: Secondary | ICD-10-CM | POA: Diagnosis not present

## 2018-01-19 DIAGNOSIS — M79604 Pain in right leg: Secondary | ICD-10-CM | POA: Diagnosis not present

## 2018-01-19 DIAGNOSIS — M25511 Pain in right shoulder: Secondary | ICD-10-CM | POA: Diagnosis not present

## 2018-01-19 DIAGNOSIS — M1990 Unspecified osteoarthritis, unspecified site: Secondary | ICD-10-CM | POA: Diagnosis not present

## 2018-01-19 DIAGNOSIS — M549 Dorsalgia, unspecified: Secondary | ICD-10-CM | POA: Diagnosis not present

## 2018-01-20 DIAGNOSIS — I2510 Atherosclerotic heart disease of native coronary artery without angina pectoris: Secondary | ICD-10-CM | POA: Diagnosis not present

## 2018-01-20 DIAGNOSIS — N400 Enlarged prostate without lower urinary tract symptoms: Secondary | ICD-10-CM | POA: Diagnosis not present

## 2018-01-20 DIAGNOSIS — R32 Unspecified urinary incontinence: Secondary | ICD-10-CM | POA: Diagnosis not present

## 2018-01-20 DIAGNOSIS — K219 Gastro-esophageal reflux disease without esophagitis: Secondary | ICD-10-CM | POA: Diagnosis not present

## 2018-01-20 DIAGNOSIS — Z87440 Personal history of urinary (tract) infections: Secondary | ICD-10-CM | POA: Diagnosis not present

## 2018-01-20 DIAGNOSIS — I10 Essential (primary) hypertension: Secondary | ICD-10-CM | POA: Diagnosis not present

## 2018-01-20 DIAGNOSIS — E785 Hyperlipidemia, unspecified: Secondary | ICD-10-CM | POA: Diagnosis not present

## 2018-01-20 DIAGNOSIS — C9111 Chronic lymphocytic leukemia of B-cell type in remission: Secondary | ICD-10-CM | POA: Diagnosis not present

## 2018-01-20 DIAGNOSIS — E1142 Type 2 diabetes mellitus with diabetic polyneuropathy: Secondary | ICD-10-CM | POA: Diagnosis not present

## 2018-01-24 DIAGNOSIS — J019 Acute sinusitis, unspecified: Secondary | ICD-10-CM | POA: Diagnosis not present

## 2018-01-26 DIAGNOSIS — E1142 Type 2 diabetes mellitus with diabetic polyneuropathy: Secondary | ICD-10-CM | POA: Diagnosis not present

## 2018-01-26 DIAGNOSIS — N400 Enlarged prostate without lower urinary tract symptoms: Secondary | ICD-10-CM | POA: Diagnosis not present

## 2018-01-26 DIAGNOSIS — I2510 Atherosclerotic heart disease of native coronary artery without angina pectoris: Secondary | ICD-10-CM | POA: Diagnosis not present

## 2018-01-26 DIAGNOSIS — N390 Urinary tract infection, site not specified: Secondary | ICD-10-CM | POA: Diagnosis not present

## 2018-01-27 DIAGNOSIS — C9111 Chronic lymphocytic leukemia of B-cell type in remission: Secondary | ICD-10-CM | POA: Diagnosis not present

## 2018-01-27 DIAGNOSIS — E785 Hyperlipidemia, unspecified: Secondary | ICD-10-CM | POA: Diagnosis not present

## 2018-01-27 DIAGNOSIS — R32 Unspecified urinary incontinence: Secondary | ICD-10-CM | POA: Diagnosis not present

## 2018-01-27 DIAGNOSIS — K219 Gastro-esophageal reflux disease without esophagitis: Secondary | ICD-10-CM | POA: Diagnosis not present

## 2018-01-27 DIAGNOSIS — Z87440 Personal history of urinary (tract) infections: Secondary | ICD-10-CM | POA: Diagnosis not present

## 2018-01-27 DIAGNOSIS — N400 Enlarged prostate without lower urinary tract symptoms: Secondary | ICD-10-CM | POA: Diagnosis not present

## 2018-01-27 DIAGNOSIS — E1142 Type 2 diabetes mellitus with diabetic polyneuropathy: Secondary | ICD-10-CM | POA: Diagnosis not present

## 2018-01-27 DIAGNOSIS — I10 Essential (primary) hypertension: Secondary | ICD-10-CM | POA: Diagnosis not present

## 2018-01-27 DIAGNOSIS — I2510 Atherosclerotic heart disease of native coronary artery without angina pectoris: Secondary | ICD-10-CM | POA: Diagnosis not present

## 2018-01-31 DIAGNOSIS — I2510 Atherosclerotic heart disease of native coronary artery without angina pectoris: Secondary | ICD-10-CM | POA: Diagnosis not present

## 2018-01-31 DIAGNOSIS — Z87440 Personal history of urinary (tract) infections: Secondary | ICD-10-CM | POA: Diagnosis not present

## 2018-01-31 DIAGNOSIS — N400 Enlarged prostate without lower urinary tract symptoms: Secondary | ICD-10-CM | POA: Diagnosis not present

## 2018-01-31 DIAGNOSIS — K219 Gastro-esophageal reflux disease without esophagitis: Secondary | ICD-10-CM | POA: Diagnosis not present

## 2018-01-31 DIAGNOSIS — E785 Hyperlipidemia, unspecified: Secondary | ICD-10-CM | POA: Diagnosis not present

## 2018-01-31 DIAGNOSIS — R32 Unspecified urinary incontinence: Secondary | ICD-10-CM | POA: Diagnosis not present

## 2018-01-31 DIAGNOSIS — C9111 Chronic lymphocytic leukemia of B-cell type in remission: Secondary | ICD-10-CM | POA: Diagnosis not present

## 2018-01-31 DIAGNOSIS — I10 Essential (primary) hypertension: Secondary | ICD-10-CM | POA: Diagnosis not present

## 2018-01-31 DIAGNOSIS — E1142 Type 2 diabetes mellitus with diabetic polyneuropathy: Secondary | ICD-10-CM | POA: Diagnosis not present

## 2018-02-02 DIAGNOSIS — N400 Enlarged prostate without lower urinary tract symptoms: Secondary | ICD-10-CM | POA: Diagnosis not present

## 2018-02-02 DIAGNOSIS — M545 Low back pain: Secondary | ICD-10-CM | POA: Diagnosis not present

## 2018-02-02 DIAGNOSIS — E785 Hyperlipidemia, unspecified: Secondary | ICD-10-CM | POA: Diagnosis not present

## 2018-02-02 DIAGNOSIS — C9111 Chronic lymphocytic leukemia of B-cell type in remission: Secondary | ICD-10-CM | POA: Diagnosis not present

## 2018-02-02 DIAGNOSIS — I10 Essential (primary) hypertension: Secondary | ICD-10-CM | POA: Diagnosis not present

## 2018-02-02 DIAGNOSIS — M1611 Unilateral primary osteoarthritis, right hip: Secondary | ICD-10-CM | POA: Diagnosis not present

## 2018-02-02 DIAGNOSIS — M67912 Unspecified disorder of synovium and tendon, left shoulder: Secondary | ICD-10-CM | POA: Diagnosis not present

## 2018-02-02 DIAGNOSIS — E1142 Type 2 diabetes mellitus with diabetic polyneuropathy: Secondary | ICD-10-CM | POA: Diagnosis not present

## 2018-02-02 DIAGNOSIS — I2510 Atherosclerotic heart disease of native coronary artery without angina pectoris: Secondary | ICD-10-CM | POA: Diagnosis not present

## 2018-02-02 DIAGNOSIS — R32 Unspecified urinary incontinence: Secondary | ICD-10-CM | POA: Diagnosis not present

## 2018-02-02 DIAGNOSIS — Z87440 Personal history of urinary (tract) infections: Secondary | ICD-10-CM | POA: Diagnosis not present

## 2018-02-02 DIAGNOSIS — M5441 Lumbago with sciatica, right side: Secondary | ICD-10-CM | POA: Diagnosis not present

## 2018-02-02 DIAGNOSIS — K219 Gastro-esophageal reflux disease without esophagitis: Secondary | ICD-10-CM | POA: Diagnosis not present

## 2018-02-04 DIAGNOSIS — A419 Sepsis, unspecified organism: Secondary | ICD-10-CM | POA: Diagnosis not present

## 2018-02-04 DIAGNOSIS — M5416 Radiculopathy, lumbar region: Secondary | ICD-10-CM | POA: Diagnosis not present

## 2018-02-04 DIAGNOSIS — E1142 Type 2 diabetes mellitus with diabetic polyneuropathy: Secondary | ICD-10-CM | POA: Diagnosis not present

## 2018-02-04 DIAGNOSIS — R112 Nausea with vomiting, unspecified: Secondary | ICD-10-CM | POA: Diagnosis not present

## 2018-02-04 DIAGNOSIS — R531 Weakness: Secondary | ICD-10-CM | POA: Diagnosis not present

## 2018-02-04 DIAGNOSIS — I10 Essential (primary) hypertension: Secondary | ICD-10-CM | POA: Diagnosis not present

## 2018-02-04 DIAGNOSIS — M109 Gout, unspecified: Secondary | ICD-10-CM | POA: Diagnosis not present

## 2018-02-04 DIAGNOSIS — N138 Other obstructive and reflux uropathy: Secondary | ICD-10-CM | POA: Diagnosis not present

## 2018-02-04 DIAGNOSIS — K7689 Other specified diseases of liver: Secondary | ICD-10-CM | POA: Diagnosis not present

## 2018-02-04 DIAGNOSIS — N12 Tubulo-interstitial nephritis, not specified as acute or chronic: Secondary | ICD-10-CM | POA: Diagnosis not present

## 2018-02-04 DIAGNOSIS — N3000 Acute cystitis without hematuria: Secondary | ICD-10-CM | POA: Diagnosis not present

## 2018-02-04 DIAGNOSIS — Z8739 Personal history of other diseases of the musculoskeletal system and connective tissue: Secondary | ICD-10-CM | POA: Diagnosis not present

## 2018-02-04 DIAGNOSIS — R339 Retention of urine, unspecified: Secondary | ICD-10-CM | POA: Diagnosis not present

## 2018-02-04 DIAGNOSIS — A4152 Sepsis due to Pseudomonas: Secondary | ICD-10-CM | POA: Diagnosis not present

## 2018-02-04 DIAGNOSIS — N401 Enlarged prostate with lower urinary tract symptoms: Secondary | ICD-10-CM | POA: Diagnosis not present

## 2018-02-04 DIAGNOSIS — R0902 Hypoxemia: Secondary | ICD-10-CM | POA: Diagnosis not present

## 2018-02-04 DIAGNOSIS — G9341 Metabolic encephalopathy: Secondary | ICD-10-CM | POA: Diagnosis not present

## 2018-02-04 DIAGNOSIS — N390 Urinary tract infection, site not specified: Secondary | ICD-10-CM | POA: Diagnosis not present

## 2018-02-04 DIAGNOSIS — N400 Enlarged prostate without lower urinary tract symptoms: Secondary | ICD-10-CM | POA: Diagnosis not present

## 2018-02-04 DIAGNOSIS — R9431 Abnormal electrocardiogram [ECG] [EKG]: Secondary | ICD-10-CM | POA: Diagnosis not present

## 2018-02-04 DIAGNOSIS — D849 Immunodeficiency, unspecified: Secondary | ICD-10-CM | POA: Diagnosis not present

## 2018-02-04 DIAGNOSIS — E785 Hyperlipidemia, unspecified: Secondary | ICD-10-CM | POA: Diagnosis not present

## 2018-02-04 DIAGNOSIS — C9190 Lymphoid leukemia, unspecified not having achieved remission: Secondary | ICD-10-CM | POA: Diagnosis not present

## 2018-02-04 DIAGNOSIS — R7881 Bacteremia: Secondary | ICD-10-CM | POA: Diagnosis not present

## 2018-02-04 DIAGNOSIS — A4150 Gram-negative sepsis, unspecified: Secondary | ICD-10-CM | POA: Diagnosis not present

## 2018-02-04 DIAGNOSIS — J9811 Atelectasis: Secondary | ICD-10-CM | POA: Diagnosis not present

## 2018-02-04 DIAGNOSIS — B965 Pseudomonas (aeruginosa) (mallei) (pseudomallei) as the cause of diseases classified elsewhere: Secondary | ICD-10-CM | POA: Diagnosis not present

## 2018-02-04 DIAGNOSIS — G92 Toxic encephalopathy: Secondary | ICD-10-CM | POA: Diagnosis not present

## 2018-02-04 DIAGNOSIS — R000 Tachycardia, unspecified: Secondary | ICD-10-CM | POA: Diagnosis not present

## 2018-02-04 DIAGNOSIS — G9340 Encephalopathy, unspecified: Secondary | ICD-10-CM | POA: Diagnosis not present

## 2018-02-04 DIAGNOSIS — D72829 Elevated white blood cell count, unspecified: Secondary | ICD-10-CM | POA: Diagnosis not present

## 2018-02-04 DIAGNOSIS — C9111 Chronic lymphocytic leukemia of B-cell type in remission: Secondary | ICD-10-CM | POA: Diagnosis not present

## 2018-02-04 DIAGNOSIS — R509 Fever, unspecified: Secondary | ICD-10-CM | POA: Diagnosis not present

## 2018-02-08 DIAGNOSIS — I2510 Atherosclerotic heart disease of native coronary artery without angina pectoris: Secondary | ICD-10-CM | POA: Diagnosis not present

## 2018-02-08 DIAGNOSIS — N400 Enlarged prostate without lower urinary tract symptoms: Secondary | ICD-10-CM | POA: Diagnosis not present

## 2018-02-08 DIAGNOSIS — N390 Urinary tract infection, site not specified: Secondary | ICD-10-CM | POA: Diagnosis not present

## 2018-02-08 DIAGNOSIS — E1142 Type 2 diabetes mellitus with diabetic polyneuropathy: Secondary | ICD-10-CM | POA: Diagnosis not present

## 2018-02-10 DIAGNOSIS — I10 Essential (primary) hypertension: Secondary | ICD-10-CM | POA: Diagnosis not present

## 2018-02-16 ENCOUNTER — Encounter (HOSPITAL_COMMUNITY): Payer: Self-pay

## 2018-02-16 ENCOUNTER — Emergency Department (HOSPITAL_COMMUNITY)
Admission: EM | Admit: 2018-02-16 | Discharge: 2018-02-16 | Disposition: A | Discharge status: Home / Self Care | Payer: Medicare HMO | Attending: Emergency Medicine | Admitting: Emergency Medicine

## 2018-02-16 ENCOUNTER — Other Ambulatory Visit: Payer: Self-pay

## 2018-02-16 DIAGNOSIS — I2510 Atherosclerotic heart disease of native coronary artery without angina pectoris: Secondary | ICD-10-CM | POA: Diagnosis not present

## 2018-02-16 DIAGNOSIS — I160 Hypertensive urgency: Secondary | ICD-10-CM | POA: Insufficient documentation

## 2018-02-16 DIAGNOSIS — E119 Type 2 diabetes mellitus without complications: Secondary | ICD-10-CM | POA: Diagnosis not present

## 2018-02-16 DIAGNOSIS — Z79899 Other long term (current) drug therapy: Secondary | ICD-10-CM | POA: Insufficient documentation

## 2018-02-16 DIAGNOSIS — Z856 Personal history of leukemia: Secondary | ICD-10-CM | POA: Insufficient documentation

## 2018-02-16 DIAGNOSIS — Z7982 Long term (current) use of aspirin: Secondary | ICD-10-CM | POA: Diagnosis not present

## 2018-02-16 DIAGNOSIS — I10 Essential (primary) hypertension: Secondary | ICD-10-CM | POA: Diagnosis present

## 2018-02-16 LAB — BASIC METABOLIC PANEL
Anion gap: 16 — ABNORMAL HIGH (ref 5–15)
BUN: 12 mg/dL (ref 8–23)
CO2: 22 mmol/L (ref 22–32)
Calcium: 9.5 mg/dL (ref 8.9–10.3)
Chloride: 103 mmol/L (ref 98–111)
Creatinine, Ser: 1.08 mg/dL (ref 0.61–1.24)
GFR calc Af Amer: >60 mL/min (ref >60)
GFR calc non Af Amer: >60 mL/min (ref >60)
Glucose, Bld: 107 mg/dL — ABNORMAL HIGH (ref 70–99)
Potassium: 3.8 mmol/L (ref 3.5–5.1)
Sodium: 141 mmol/L (ref 135–145)

## 2018-02-16 LAB — CBC WITH DIFFERENTIAL/PLATELET
Abs Immature Granulocytes: 0 10*3/uL (ref 0.0–0.1)
Basophils Absolute: 0.1 10*3/uL (ref 0.0–0.1)
Basophils Relative: 1 %
Eosinophils Absolute: 0.1 10*3/uL (ref 0.0–0.7)
Eosinophils Relative: 2 %
HCT: 44.4 % (ref 39.0–52.0)
Hemoglobin: 15.4 g/dL (ref 13.0–17.0)
Immature Granulocytes: 1 %
Lymphocytes Relative: 40 %
Lymphs Abs: 2.4 10*3/uL (ref 0.7–4.0)
MCH: 31 pg (ref 26.0–34.0)
MCHC: 34.7 g/dL (ref 30.0–36.0)
MCV: 89.5 fL (ref 78.0–100.0)
Monocytes Absolute: 0.5 10*3/uL (ref 0.1–1.0)
Monocytes Relative: 9 %
Neutro Abs: 2.9 10*3/uL (ref 1.7–7.7)
Neutrophils Relative %: 47 %
Platelets: 239 10*3/uL (ref 150–400)
RBC: 4.96 MIL/uL (ref 4.22–5.81)
RDW: 13.4 % (ref 11.5–15.5)
WBC: 6 10*3/uL (ref 4.0–10.5)

## 2018-02-16 LAB — T4, FREE: Free T4: 0.9 ng/dL (ref 0.82–1.77)

## 2018-02-16 LAB — TSH: TSH: 0.652 u[IU]/mL (ref 0.350–4.500)

## 2018-02-16 NOTE — ED Triage Notes (Signed)
Pt states that he was in the hospital about 2 weeks ago and was stopped on his blood pressure medications, reports his pressure has been going up, pt restarted medications about 2 days ago. Pt is poor historian.

## 2018-02-16 NOTE — ED Notes (Signed)
Patient Alert and oriented to baseline. Stable and ambulatory to baseline. Patient verbalized understanding of the discharge instructions.  Patient belongings were taken by the patient.   

## 2018-02-16 NOTE — Discharge Instructions (Signed)
As discussed, your evaluation today has been largely reassuring.  But, it is important that you monitor your condition carefully, and do not hesitate to return to the ED if you develop new, or concerning changes in your condition. ? ?Otherwise, please follow-up with your physician for appropriate ongoing care. ? ?

## 2018-02-16 NOTE — ED Provider Notes (Signed)
Patient placed in Quick Look pathway, seen and evaluated   Chief Complaint: HTN  HPI: 72 year old male with a history of CVA, CAD, hypertension, HLD who presents today the ED today for hypertension. He reports that he was discharged from hospital 2 weeks ago without some of his blood pressure medication.  He reports his blood pressure has been elevated since that time.  He saw his PCP 2 days ago and was restarted on his medications.  His blood pressures continue to be elevated.  He is not sure the readings at home.  He denies any associated headache, visual changes, diplopia, facial droop, difficulty with speech, vertigo, focal weakness, numbness/tingling, chest pain, shortness of breath, changes in urination.  He is unsure of history of kidney disease.  ROS:  Positive ROS: (+) Elevated blood pressure Negative ROS: (-) Chest pain, shortness of breath or neuro symptoms  Physical Exam:   Gen: No distress  Neuro:  Heart RRR   Lungs CTAB  Abd soft, NT, no rebound or guarding    Focused Exam:Speech clear. Follows commands. No facial droop. PERRLA. EOMI. Normal peripheral fields. CN III-XII intact.  Grossly moves all extremities 4 without ataxia. Coordination intact. Able and appropriate strength for age to upper and lower extremities bilaterally including grip strength & knee flexion/extension. Sensation to light touch intact bilaterally for upper and lower. Normal finger to nose. No pronator drift.    BP (!) 151/106 (BP Location: Right Arm)   Pulse 89   Temp 98.1 F (36.7 C) (Oral)   Resp 18   Ht 6\' 2"  (1.88 m)   Wt 124.7 kg   SpO2 99%   BMI 35.31 kg/m   Plan:  Based on initial evaluation, labs are indicated and radiology studies are not indicated.  Patient counseled on process, plan, and necessity for staying for completing the evaluation."  Initiation of care has begun. The patient has been counseled on the process, plan, and necessity for staying for the completion/evaluation, and  the remainder of the medical screening examination    Lorelle Gibbs 02/16/18 [place], Wenda [place], MD 02/17/18 [phone]

## 2018-02-16 NOTE — ED Provider Notes (Signed)
[facility] EMERGENCY DEPARTMENT Provider Note   CSN: [id number] Arrival date & time: 02/16/18  1319     History   Chief Complaint Chief Complaint  Patient presents with  . Hypertension    HPI Ronald Rice is a 72 y.o. male.  HPI Patient with multiple medical issues presents after recent hospitalization, now with concern for persistent hypertension.  Patient notes that since discharge he has been taking Vibramycin, and there were changes to his blood pressure medicine regimen due to concern for possible medication interactions. However, 3 days ago the patient spoke with his physician, due to persistently elevated blood pressure readings, and was started on his typical regimen again, including lisinopril/hydrochlorthiazide, amlodipine, labetalol. He presents today, while his wife is being evaluated for another concern, states that he actually feels" all right" without focal pain, dyspnea, abdominal pain, nausea, lightheadedness. However, with blood pressure readings remain elevated beyond baseline he presents for evaluation.   Past Medical History:  Diagnosis Date  . Cancer ([place])    Leukemia   . CLL (chronic lymphocytic leukemia) ([place])    see records in care everywhere from [place]  . Coronary artery disease    minimal nonobstructive by 10/31/16 cath at [facility] in [place]  . Diabetes mellitus without complication ([place])   . GERD (gastroesophageal reflux disease)   . Hypertension     Patient Active Problem List   Diagnosis Date Noted  . Fever   . Acute cystitis with hematuria   . Ischemic stroke ([place]) 11/22/2017  . Right hip pain 07/18/2017  . Cerebellar ataxia in diseases classified elsewhere ([place]) 07/18/2017  . Radiculopathy 04/20/2017  . Chronic lymphoid leukemia ([place]) 01/25/2017  . CAD (coronary artery disease) 10/31/2016  . Influenza A 05/24/2016  . Acute bronchitis 05/22/2016  . Chronic leukemia ([place]) 05/22/2016  . Weakness 05/22/2016  .  Sepsis ([place]) 05/22/2016  . Pedal edema 05/22/2016  . Gait abnormality 07/28/2015  . Nonspecific elevation of levels of transaminase and lactic acid dehydrogenase (LDH) 03/02/2015  . Pseudarthrosis after fusion or arthrodesis 06/03/2014  . Fusion of spine of cervical region 06/05/2013  . Fusion of lumbar spine 03/07/2012  . Benign prostatic hyperplasia 06/15/2011  . Liver cyst 07/29/2009  . Kidney cyst, acquired 06/17/2009  . Type 2 diabetes mellitus without complications ([place]) 40/81/4481  . Diverticulosis of colon 08/03/2007  . Hyperlipidemia 03/14/2007  . Neuropathy, peripheral 12/03/2005  . Allergic rhinitis 03/01/2000  . Essential (primary) hypertension 10/03/1996  . GERD (gastroesophageal reflux disease) 07/02/1995    Past Surgical History:  Procedure Laterality Date  . ANTERIOR CERVICAL DECOMP/DISCECTOMY FUSION N/A 04/20/2017   Procedure: ANTERIOR CERVICAL DECOMPRESSION FUSION, CERVICAL 3-4, CERVICAL 4-5 WITH INSTRUMENTATION AND ALLOGRAFT; REQUEST 3.5 HOURS;  Surgeon: Phylliss Bob, MD;  Location: [facility];  Service: Orthopedics;  Laterality: N/A;  ANTERIOR CERVICAL DECOMPRESSION FUSION, CERVICAL 3-4, CERVICAL 4-5 WITH INSTRUMENTATION AND ALLOGRAFT; REQUEST 3.5 HOURS  . BACK SURGERY    . COLONOSCOPY          Home Medications    Prior to Admission medications   Medication Sig Start Date End Date Taking? Authorizing Provider  allopurinol (ZYLOPRIM) 300 MG tablet Take 300 mg by mouth daily.    [provider]  amLODipine (NORVASC) 5 MG tablet Take 5 mg by mouth daily.    [provider]  aspirin EC 81 MG tablet Take 81 mg by mouth daily.    [provider]  atenolol (TENORMIN) 25 MG tablet Take 25 mg by mouth daily.  [provider]  atorvastatin (LIPITOR) 40 MG tablet Take 40 mg by mouth daily.    [provider]  cefpodoxime (VANTIN) 200 MG tablet Take 1 tablet (200 mg total) by mouth 2 (two) times daily. 11/26/17   Thurnell Lose, MD  CIALIS 20 MG tablet Take 20 mg by mouth as needed for erectile dysfunction. 10/06/17   [provider]  Cinnamon 500 MG TABS Take 500 mg by mouth daily.    [provider]  famotidine (PEPCID) 20 MG tablet Take 20 mg by mouth at bedtime.    [provider]  gabapentin (NEURONTIN) 300 MG capsule Take 1 capsule (300 mg total) by mouth at bedtime. 08/05/17   Marcial Pacas, MD  Ginsengs-Royal Jelly (GINSENG COMPLEX/ROYAL JELLY) 800-200 MG CAPS Take 1 capsule by mouth daily.    [provider]  ketoconazole (NIZORAL) 2 % cream Apply 1 application topically as needed for irritation. 11/02/17   [provider]  lisinopril-hydrochlorothiazide (PRINZIDE,ZESTORETIC) 20-25 MG tablet Take 2 tablets by mouth daily.    [provider]  Multiple Vitamins-Minerals (CENTRUM SILVER 50+MEN PO) Take 1 tablet by mouth daily.    [provider]  naproxen sodium (ALEVE) 220 MG tablet Take 220 mg by mouth 2 (two) times daily as needed (PAIN).    [provider]  NON FORMULARY Take 1 tablet by mouth daily. Blood Sugar Defense     [provider]  potassium chloride (K-DUR) 10 MEQ tablet Take 10 mEq by mouth daily.     [provider]  tamsulosin (FLOMAX) 0.4 MG CAPS capsule Take 0.4 mg by mouth daily. 11/11/17   [provider]  tiZANidine (ZANAFLEX) 4 MG tablet Take 4 mg by mouth every 12 (twelve) hours as needed for muscle spasms. 11/02/17   [provider]  Xylometazoline HCl (4-WAY NASAL SPRAY NA) Place 1 spray into both nostrils as needed (for allergies).    [provider]    Family History Family History  Problem Relation Age of Onset  . Heart attack Mother     Social History Social History   Tobacco Use  . Smoking status: Never Smoker  . Smokeless tobacco: Never Used  Substance Use Topics  . Alcohol use: Yes    Frequency: Never    Comment: Rare  . Drug use: No      Allergies   Patient has no known allergies.   Review of Systems Review of Systems  Constitutional:       Per HPI, otherwise negative  HENT:       Per HPI, otherwise negative  Respiratory:       Per HPI, otherwise negative  Cardiovascular:       Per HPI, otherwise negative  Gastrointestinal: Negative for vomiting.  Endocrine:       Negative aside from HPI  Genitourinary:       Neg aside from HPI   Musculoskeletal:       Per HPI, otherwise negative  Skin: Negative.   Neurological: Negative for syncope.     Physical Exam Updated Vital Signs BP (!) 151/106 (BP Location: Right Arm)   Pulse 89   Temp 98.1 F (36.7 C) (Oral)   Resp 18   Ht 6\' 2"  (1.88 m)   Wt 124.7 kg   SpO2 99%   BMI 35.31 kg/m    Physical Exam  Constitutional: He is oriented to person, place, and time. He appears well-developed. No distress.  HENT:  Head: Normocephalic  and atraumatic.  Eyes: Conjunctivae and EOM are normal.  Cardiovascular: Normal rate and regular rhythm.  Pulmonary/Chest: Effort normal. No stridor. No respiratory distress.  Abdominal: He exhibits no distension. There is no tenderness. There is no guarding.  Musculoskeletal: He exhibits no edema.  Neurological: He is alert and oriented to person, place, and time.  Skin: Skin is warm and dry.  Psychiatric: He has a normal mood and affect.  Nursing note and vitals reviewed.    ED Treatments / Results  Labs (all labs ordered are listed, but only abnormal results are displayed) Labs Reviewed  BASIC METABOLIC PANEL - Abnormal; Notable for the following components:      Result Value   Glucose, Bld 107 (*)    Anion gap 16 (*)    All other components within normal limits  CBC WITH DIFFERENTIAL/PLATELET  TSH  T4, FREE     EKG Interpretation  Date/Time:  Thursday February 16 2018 13:55:03 EDT Ventricular Rate:  81 PR Interval:  172 QRS Duration: 78 QT Interval:  374 QTC Calculation: 434 R Axis:   48 Text  Interpretation:  Normal sinus rhythm Nonspecific ST and T wave abnormality Abnormal ECG Abnormal ekg Confirmed by Carmin Muskrat [phone]) on 02/16/2018 5:31:09 PM      Procedures Procedures (including critical care time)  Medications Ordered in ED Medications - No data to display   Initial Impression / Assessment and Plan / ED Course  I have reviewed the triage vital signs and the nursing notes.  Pertinent labs & imaging results that were available during my care of the patient were reviewed by me and considered in my medical decision making (see chart for details).    On repeat exam the patient is awake, alert, in no distress. We discussed his reassuring labs, no evidence for endorgan effects. He has states that he has had multiple episodes of diaphoresis over the past few days, has some suspicion of thyroid disease. Patient is not tachycardic, is not distressed, is no increased work of breathing, there is no evidence for thyroid storm. To facilitate outpatient treatment, thyroid labs were obtained, sent prior to discharge. However, with reassuring labs, no evidence for endorgan effects, no evidence for atypical ACS or other acute new pathology, patient will continue his home medication regimen until his follow up visit, which is a rescheduled for tomorrow.   Final Clinical Impressions(s) / ED Diagnoses   Final diagnoses:  Hypertensive urgency     Carmin Muskrat, MD 02/16/18 1732

## 2018-02-17 DIAGNOSIS — E1159 Type 2 diabetes mellitus with other circulatory complications: Secondary | ICD-10-CM | POA: Diagnosis not present

## 2018-02-17 DIAGNOSIS — E1169 Type 2 diabetes mellitus with other specified complication: Secondary | ICD-10-CM | POA: Diagnosis not present

## 2018-02-17 DIAGNOSIS — E785 Hyperlipidemia, unspecified: Secondary | ICD-10-CM | POA: Diagnosis not present

## 2018-02-17 DIAGNOSIS — I152 Hypertension secondary to endocrine disorders: Secondary | ICD-10-CM | POA: Diagnosis not present

## 2018-02-17 DIAGNOSIS — A4152 Sepsis due to Pseudomonas: Secondary | ICD-10-CM | POA: Diagnosis not present

## 2018-02-17 DIAGNOSIS — E1140 Type 2 diabetes mellitus with diabetic neuropathy, unspecified: Secondary | ICD-10-CM | POA: Diagnosis not present

## 2018-02-17 DIAGNOSIS — Z7689 Persons encountering health services in other specified circumstances: Secondary | ICD-10-CM | POA: Diagnosis not present

## 2018-02-23 ENCOUNTER — Other Ambulatory Visit: Payer: Self-pay

## 2018-02-27 DIAGNOSIS — E119 Type 2 diabetes mellitus without complications: Secondary | ICD-10-CM | POA: Diagnosis not present

## 2018-02-27 DIAGNOSIS — C9190 Lymphoid leukemia, unspecified not having achieved remission: Secondary | ICD-10-CM | POA: Diagnosis not present

## 2018-02-27 DIAGNOSIS — I10 Essential (primary) hypertension: Secondary | ICD-10-CM | POA: Diagnosis not present

## 2018-02-27 DIAGNOSIS — Z23 Encounter for immunization: Secondary | ICD-10-CM | POA: Diagnosis not present

## 2018-03-07 DIAGNOSIS — M1611 Unilateral primary osteoarthritis, right hip: Secondary | ICD-10-CM | POA: Diagnosis not present

## 2018-03-07 DIAGNOSIS — M48061 Spinal stenosis, lumbar region without neurogenic claudication: Secondary | ICD-10-CM | POA: Diagnosis not present

## 2018-03-08 ENCOUNTER — Other Ambulatory Visit: Payer: Self-pay

## 2018-03-08 DIAGNOSIS — Z125 Encounter for screening for malignant neoplasm of prostate: Secondary | ICD-10-CM | POA: Diagnosis not present

## 2018-03-08 DIAGNOSIS — E119 Type 2 diabetes mellitus without complications: Secondary | ICD-10-CM | POA: Diagnosis not present

## 2018-03-08 DIAGNOSIS — E785 Hyperlipidemia, unspecified: Secondary | ICD-10-CM | POA: Diagnosis not present

## 2018-03-08 DIAGNOSIS — E1142 Type 2 diabetes mellitus with diabetic polyneuropathy: Secondary | ICD-10-CM | POA: Diagnosis not present

## 2018-03-08 DIAGNOSIS — C9111 Chronic lymphocytic leukemia of B-cell type in remission: Secondary | ICD-10-CM | POA: Diagnosis not present

## 2018-03-08 DIAGNOSIS — I2510 Atherosclerotic heart disease of native coronary artery without angina pectoris: Secondary | ICD-10-CM | POA: Diagnosis not present

## 2018-03-08 DIAGNOSIS — I10 Essential (primary) hypertension: Secondary | ICD-10-CM | POA: Diagnosis not present

## 2018-03-13 DIAGNOSIS — E119 Type 2 diabetes mellitus without complications: Secondary | ICD-10-CM | POA: Diagnosis not present

## 2018-03-13 DIAGNOSIS — E785 Hyperlipidemia, unspecified: Secondary | ICD-10-CM | POA: Diagnosis not present

## 2018-03-13 DIAGNOSIS — K219 Gastro-esophageal reflux disease without esophagitis: Secondary | ICD-10-CM | POA: Diagnosis not present

## 2018-03-13 DIAGNOSIS — I10 Essential (primary) hypertension: Secondary | ICD-10-CM | POA: Diagnosis not present

## 2018-03-13 DIAGNOSIS — Z0000 Encounter for general adult medical examination without abnormal findings: Secondary | ICD-10-CM | POA: Diagnosis not present

## 2018-03-13 NOTE — Progress Notes (Signed)
This encounter was created in error - please disregard.

## 2018-03-13 NOTE — Addendum Note (Signed)
Addended by: Darvin Neighbours on: 03/13/2018 03:03 PM   Modules accepted: Level of Service, SmartSet

## 2018-03-13 NOTE — Patient Outreach (Signed)
Patient refused to be interviewed. Unable to obtain mRs.

## 2018-03-16 ENCOUNTER — Encounter: Payer: Self-pay | Admitting: Neurology

## 2018-03-16 ENCOUNTER — Ambulatory Visit: Payer: 59 | Admitting: Neurology

## 2018-03-16 VITALS — BP 138/82 | HR 78 | Ht 74.0 in | Wt 288.0 lb

## 2018-03-16 DIAGNOSIS — M5416 Radiculopathy, lumbar region: Secondary | ICD-10-CM

## 2018-03-16 DIAGNOSIS — R269 Unspecified abnormalities of gait and mobility: Secondary | ICD-10-CM | POA: Diagnosis not present

## 2018-03-16 DIAGNOSIS — R531 Weakness: Secondary | ICD-10-CM

## 2018-03-16 MED ORDER — GABAPENTIN 300 MG PO CAPS: 300.0000 mg | ORAL_CAPSULE | Freq: Three times a day (TID) | ORAL | 11 refills | Status: DC

## 2018-03-16 NOTE — Progress Notes (Signed)
PATIENT: Ronald Rice DOB: Aug 02, 1945  Chief Complaint  Patient presents with  . Numbness    He is here with his wife, Ronald Rice.  Reports numbness/tingling in his bilateral hands and feet.  He is having difficulty with his grip.  He is using a rolling walker to assist with ambulation.  Marland Kitchen PCP    Ronald Pretty, MD     HISTORICAL  Ronald Rice is a 72 year old male, seen in refer by his primary care doctor Ronald Rice for evaluation of bilateral lower extremity weakness, initial evaluation was on July 18, 2017.  He is accompanied by his fiance at today's clinical visit.  I have reviewed and summarized the referring note, he has past medical history of hypertension, hyperlipidemia, gout, diabetes, coronary artery disease, obesity  He had multiple lumbar decompression surgery, this was all done in [place], most recent lumbar decompression was in 2016, also had anterior cervical decompression fusion C4-5 with instrumentation and allograft on April 20, 2017, history of chronic lymphocytic leukemia, was treated at [place] with chemotherapy,  He reported gradual onset gait abnormality since 2014, gradually getting worse, despite the surgery, worsening low back pain, sometimes radiating pain to right leg, increased weakness in his right leg, also has occasionally bowel incontinence, urinary incontinence.  UPDATE August 05 2017: We have personally reviewed MRI of cervical spine March 2019, evidence of C3-7 fusion, metal artifact, residual mild stenosis at C3-4, C4-5, but no evidence of cord compression, there appears to be spinal cord atrophy at C3-4, C4-5, sagittal STIR imaging appears to show small foci of increased signal within the spinal cord adjacent to C4-5,  MRI of thoracic spine showed multilevel degenerative disc disease, facet arthropathy of the thoracic spine, central disc protrusion at T3-4, T4-5 contact the ventral cord, no spinal cord and neural foraminal stenosis at  this level, questionable increased central cord signal at T6-7, T7-8,  MRI of the lumbar spine in February 2019, status post L3 through S1 posterior decompression and fusion, moderate L1 compression fracture, status post cement augmentation, stable degenerative changes of lumbar spine, moderate canal stenosis at L2 and 3, most compatible with adjacent segment disease, multilevel neuroforaminal narrowing, moderate on the left at L1-2,  Patient complains of radiating pain from right lower back to right lower extremity, after bearing weight, sometimes with body changing position turning towards the right side, he has persistent bilateral distal ankle weakness,  Today's electrodiagnostic study showed evidence of chronic bilateral lumbosacral radiculopathy, right more obvious than the left side, mainly involving bilateral L3, L4 myotomes.  UPDATE Mar 16 2018: He is referred back by his primary care physician Dr. Shelia Rice, for his continued bilateral hands numbness weakness, drop things from his hands, gait abnormality.  He is accompanied by his wife at visit,  He has intermittent low back pain, especially when getting up, he felt radiating pain from right lower back to right groin and anterior thigh region, his right leg buckled, give out underneath him, continue gait abnormality, rely on his walker, also has intermittent bilateral hands paresthesia, previous left carpal tunnel syndrome, right hand is more symptomatic, drop things from his hand,  Echocardiogram on November 23, 2017 showed ejection fraction of 65 to 70%,, We personally reviewed MRI of the brain on November 22, 2017, advanced generalized atrophy, no acute abnormality. CT angiogram of head and neck in July 2019 showed no significant large vessel disease, MRI cervical spine in March 2019: Evidence of ACDF C3-C5, postsurgical change fusion of C5-6, C7,  patchy area of cord signal abnormality, adjacent to C3-4, C4-5, evidence of cervical cord atrophy. MRI  thoracic spine March 2019, multilevel degenerative changes, there was no significant canal stenosis,  MRI of lumbar spine February 2019, status post L3-S1 posterior decompression, fusion, moderate L1 compression fracture, status post a cemented augmentation, moderate canal stenosis at L2 and 3,  EMG nerve conduction study March 2019 showed evidence of chronic bilateral lumbosacral radiculopathy, right worse than left, with evidence of active denervation involving tibialis anterior, medial gastrocnemius, vastus lateralis, consistent with bilateral L3-4 lumbar radiculopathy,  There is also evidence of severe right carpal tunnel syndromes, with a background of mild axonal sensorimotor polyneuropathy,   Laboratory evaluation on March 08, 2018, hemoglobin of 15.2, creatinine of 1.1, CMP showed mild elevated glucose 134, LDL of 116, A1c was 6.2,  REVIEW OF SYSTEMS: Full 14 system review of systems performed and notable only for swelling legs, incontinence, urination problems, joint pain  ALLERGIES: No Known Allergies  HOME MEDICATIONS: Current Outpatient Medications  Medication Sig Dispense Refill  . allopurinol (ZYLOPRIM) 300 MG tablet Take 300 mg by mouth daily.    Marland Kitchen amLODipine (NORVASC) 5 MG tablet Take 5 mg by mouth daily.    Marland Kitchen aspirin EC 81 MG tablet Take 81 mg by mouth daily.    Marland Kitchen atenolol (TENORMIN) 25 MG tablet Take 25 mg by mouth daily.     Marland Kitchen atorvastatin (LIPITOR) 40 MG tablet Take 40 mg by mouth daily.    Marland Kitchen CIALIS 20 MG tablet Take 20 mg by mouth as needed for erectile dysfunction.  6  . Cinnamon 500 MG TABS Take 500 mg by mouth daily.    . famotidine (PEPCID) 20 MG tablet Take 20 mg by mouth at bedtime.    . gabapentin (NEURONTIN) 300 MG capsule Take 1 capsule (300 mg total) by mouth at bedtime. 60 capsule 11  . Ginsengs-Royal Jelly (GINSENG COMPLEX/ROYAL JELLY) 800-200 MG CAPS Take 1 capsule by mouth daily.    Marland Kitchen ketoconazole (NIZORAL) 2 % cream Apply 1 application topically as  needed for irritation.  0  . lisinopril-hydrochlorothiazide (PRINZIDE,ZESTORETIC) 20-25 MG tablet Take 1 tablet by mouth daily.     . Multiple Vitamins-Minerals (CENTRUM SILVER 50+MEN PO) Take 1 tablet by mouth daily.    . naproxen sodium (ALEVE) 220 MG tablet Take 220 mg by mouth 2 (two) times daily as needed (PAIN).    . NON FORMULARY Take 1 tablet by mouth daily. Blood Sugar Defense     . potassium chloride (K-DUR) 10 MEQ tablet Take 10 mEq by mouth daily.     . tamsulosin (FLOMAX) 0.4 MG CAPS capsule Take 0.4 mg by mouth daily.  10  . tiZANidine (ZANAFLEX) 4 MG tablet Take 4 mg by mouth every 12 (twelve) hours as needed for muscle spasms.  0  . Xylometazoline HCl (4-WAY NASAL SPRAY NA) Place 1 spray into both nostrils as needed (for allergies).     No current facility-administered medications for this visit.     PAST MEDICAL HISTORY: Past Medical History:  Diagnosis Date  . Cancer ([place])    Leukemia   . CLL (chronic lymphocytic leukemia) ([place])    see records in care everywhere from [place]  . Coronary artery disease    minimal nonobstructive by 10/31/16 cath at [facility] in [place]  . Diabetes mellitus without complication ([place])   . GERD (gastroesophageal reflux disease)   . Hypertension     PAST SURGICAL HISTORY: Past Surgical History:  Procedure Laterality  Date  . ANTERIOR CERVICAL DECOMP/DISCECTOMY FUSION N/A 04/20/2017   Procedure: ANTERIOR CERVICAL DECOMPRESSION FUSION, CERVICAL 3-4, CERVICAL 4-5 WITH INSTRUMENTATION AND ALLOGRAFT; REQUEST 3.5 HOURS;  Surgeon: Phylliss Bob, MD;  Location: [place];  Service: Orthopedics;  Laterality: N/A;  ANTERIOR CERVICAL DECOMPRESSION FUSION, CERVICAL 3-4, CERVICAL 4-5 WITH INSTRUMENTATION AND ALLOGRAFT; REQUEST 3.5 HOURS  . BACK SURGERY    . COLONOSCOPY      FAMILY HISTORY: Family History  Problem Relation Age of Onset  . Heart attack Mother     SOCIAL HISTORY:  Social History   Socioeconomic History  . Marital status: Single      Spouse name: Not on file  . Number of children: 1  . Years of education: 19  . Highest education level: Not on file  Occupational History  . Occupation: Retired  Scientific laboratory technician  . Financial resource strain: Not on file  . Food insecurity:    Worry: Not on file    Inability: Not on file  . Transportation needs:    Medical: Not on file    Non-medical: Not on file  Tobacco Use  . Smoking status: Never Smoker  . Smokeless tobacco: Never Used  Substance and Sexual Activity  . Alcohol use: Yes    Frequency: Never    Comment: Rare  . Drug use: No  . Sexual activity: Not on file  Lifestyle  . Physical activity:    Days per week: Not on file    Minutes per session: Not on file  . Stress: Not on file  Relationships  . Social connections:    Talks on phone: Not on file    Gets together: Not on file    Attends religious service: Not on file    Active member of club or organization: Not on file    Attends meetings of clubs or organizations: Not on file    Relationship status: Not on file  . Intimate partner violence:    Fear of current or ex partner: Not on file    Emotionally abused: Not on file    Physically abused: Not on file    Forced sexual activity: Not on file  Other Topics Concern  . Not on file  Social History Narrative   Lives with wife   Caffeine use: Drinks coffee daily (2 cups)   Right handed      PHYSICAL EXAM   Vitals:   03/16/18 1417  BP: 138/82  Pulse: 78  Weight: 288 lb (130.6 kg)  Height: 6\' 2"  (1.88 m)    Not recorded      Body mass index is 36.98 kg/m.  PHYSICAL EXAMNIATION:  Gen: NAD, conversant, well nourised, obese, well groomed                     Cardiovascular: Regular rate rhythm, no peripheral edema, warm, nontender. Eyes: Conjunctivae clear without exudates or hemorrhage Neck: Supple, no carotid bruits. Pulmonary: Clear to auscultation bilaterally   NEUROLOGICAL EXAM:  MENTAL STATUS: Speech:    Speech is normal; fluent  and spontaneous with normal comprehension.  Cognition:     Orientation to time, place and person     Normal recent and remote memory     Normal Attention span and concentration     Normal Language, naming, repeating,spontaneous speech     Fund of knowledge   CRANIAL NERVES: CN II: Visual fields are full to confrontation. Fundoscopic exam is normal with sharp discs and no vascular changes. Pupils  are round equal and briskly reactive to light. CN III, IV, VI: extraocular movement are normal. No ptosis. CN V: Facial sensation is intact to pinprick in all 3 divisions bilaterally. Corneal responses are intact.  CN VII: Face is symmetric with normal eye closure and smile. CN VIII: Hearing is normal to rubbing fingers CN IX, X: Palate elevates symmetrically. Phonation is normal. CN XI: Head turning and shoulder shrug are intact CN XII: Tongue is midline with normal movements and no atrophy.  MOTOR: He has bilateral ankle dorsiflexion weakness, right side is moderate to severe, left side is mild to moderate, mild bilateral hip flexion weakness  REFLEXES: Reflexes are hypoactive and symmetric at the biceps, triceps, knees, and absent at ankles. Plantar responses are flexor.  SENSORY: Length dependent decreased to light touch, pinprick, and vibratory sensation to ankle level   COORDINATION: Rapid alternating movements and fine finger movements are intact. There is no dysmetria on finger-to-nose and heel-knee-shin.    GAIT/STANCE: He needs pushed up to get up from seated position, dragging his right leg across the floor, unsteady   DIAGNOSTIC DATA (LABS, IMAGING, TESTING) - I reviewed patient records, labs, notes, testing and imaging myself where available.   ASSESSMENT AND PLAN  Ericberto Padget is a 72 y.o. male   Gait abnormality Radiating pain of the right lower back to right lower extremity, History of cervical spondylitic myelopathy status post decompression November 2018  His  gait difficulty are multifactorial, including evidence of chronic lumbar radiculopathy, with persistent distal leg weakness, right worse than left, residual symptoms on previous cervical spondylitic myelopathy, evidence of cervical cord atrophy, cord signal changes, there also significant generalized brain atrophy, his pain, chronic neuropathy also contributed  Physical therapy  Follow-up with orthopedic surgeon, potential pain management,  Face to face time was 25 minutes, greater than 50% of the time was spent in counseling and coordination of care with the patient.       Marcial Pacas, M.D. Ph.D.  [facility] [address] [place], [place] [address] Ph: [phone] Fax: [phone]  ZP:HXTAV, Thayer Jew, MD

## 2018-04-04 DIAGNOSIS — M545 Low back pain: Secondary | ICD-10-CM | POA: Diagnosis not present

## 2018-04-05 DIAGNOSIS — M1990 Unspecified osteoarthritis, unspecified site: Secondary | ICD-10-CM | POA: Diagnosis not present

## 2018-04-05 DIAGNOSIS — M79604 Pain in right leg: Secondary | ICD-10-CM | POA: Diagnosis not present

## 2018-04-05 DIAGNOSIS — M25511 Pain in right shoulder: Secondary | ICD-10-CM | POA: Diagnosis not present

## 2018-04-05 DIAGNOSIS — M549 Dorsalgia, unspecified: Secondary | ICD-10-CM | POA: Diagnosis not present

## 2018-04-05 DIAGNOSIS — M25512 Pain in left shoulder: Secondary | ICD-10-CM | POA: Diagnosis not present

## 2018-04-06 DIAGNOSIS — H5202 Hypermetropia, left eye: Secondary | ICD-10-CM | POA: Diagnosis not present

## 2018-04-06 DIAGNOSIS — H25813 Combined forms of age-related cataract, bilateral: Secondary | ICD-10-CM | POA: Diagnosis not present

## 2018-04-06 DIAGNOSIS — H16223 Keratoconjunctivitis sicca, not specified as Sjogren's, bilateral: Secondary | ICD-10-CM | POA: Diagnosis not present

## 2018-04-06 DIAGNOSIS — H40013 Open angle with borderline findings, low risk, bilateral: Secondary | ICD-10-CM | POA: Diagnosis not present

## 2018-04-06 DIAGNOSIS — H524 Presbyopia: Secondary | ICD-10-CM | POA: Diagnosis not present

## 2018-04-06 DIAGNOSIS — E119 Type 2 diabetes mellitus without complications: Secondary | ICD-10-CM | POA: Diagnosis not present

## 2018-04-14 DIAGNOSIS — N390 Urinary tract infection, site not specified: Secondary | ICD-10-CM | POA: Diagnosis not present

## 2018-04-14 DIAGNOSIS — R9431 Abnormal electrocardiogram [ECG] [EKG]: Secondary | ICD-10-CM | POA: Diagnosis not present

## 2018-04-14 DIAGNOSIS — R4182 Altered mental status, unspecified: Secondary | ICD-10-CM | POA: Diagnosis not present

## 2018-04-14 DIAGNOSIS — N3001 Acute cystitis with hematuria: Secondary | ICD-10-CM | POA: Diagnosis not present

## 2018-04-14 DIAGNOSIS — M5410 Radiculopathy, site unspecified: Secondary | ICD-10-CM | POA: Diagnosis not present

## 2018-04-14 DIAGNOSIS — N401 Enlarged prostate with lower urinary tract symptoms: Secondary | ICD-10-CM | POA: Diagnosis not present

## 2018-04-14 DIAGNOSIS — R7881 Bacteremia: Secondary | ICD-10-CM | POA: Diagnosis not present

## 2018-04-14 DIAGNOSIS — Q614 Renal dysplasia: Secondary | ICD-10-CM | POA: Diagnosis not present

## 2018-04-14 DIAGNOSIS — I10 Essential (primary) hypertension: Secondary | ICD-10-CM | POA: Diagnosis not present

## 2018-04-14 DIAGNOSIS — R1111 Vomiting without nausea: Secondary | ICD-10-CM | POA: Diagnosis not present

## 2018-04-14 DIAGNOSIS — J189 Pneumonia, unspecified organism: Secondary | ICD-10-CM | POA: Diagnosis not present

## 2018-04-14 DIAGNOSIS — E1169 Type 2 diabetes mellitus with other specified complication: Secondary | ICD-10-CM | POA: Diagnosis not present

## 2018-04-14 DIAGNOSIS — N400 Enlarged prostate without lower urinary tract symptoms: Secondary | ICD-10-CM | POA: Diagnosis not present

## 2018-04-14 DIAGNOSIS — R509 Fever, unspecified: Secondary | ICD-10-CM | POA: Diagnosis not present

## 2018-04-14 DIAGNOSIS — K429 Umbilical hernia without obstruction or gangrene: Secondary | ICD-10-CM | POA: Diagnosis not present

## 2018-04-14 DIAGNOSIS — Z7984 Long term (current) use of oral hypoglycemic drugs: Secondary | ICD-10-CM | POA: Diagnosis not present

## 2018-04-14 DIAGNOSIS — R8281 Pyuria: Secondary | ICD-10-CM | POA: Diagnosis not present

## 2018-04-14 DIAGNOSIS — K219 Gastro-esophageal reflux disease without esophagitis: Secondary | ICD-10-CM | POA: Diagnosis not present

## 2018-04-14 DIAGNOSIS — Z794 Long term (current) use of insulin: Secondary | ICD-10-CM | POA: Diagnosis not present

## 2018-04-14 DIAGNOSIS — E1142 Type 2 diabetes mellitus with diabetic polyneuropathy: Secondary | ICD-10-CM | POA: Diagnosis not present

## 2018-04-14 DIAGNOSIS — Z79899 Other long term (current) drug therapy: Secondary | ICD-10-CM | POA: Diagnosis not present

## 2018-04-14 DIAGNOSIS — E785 Hyperlipidemia, unspecified: Secondary | ICD-10-CM | POA: Diagnosis not present

## 2018-04-14 DIAGNOSIS — N2889 Other specified disorders of kidney and ureter: Secondary | ICD-10-CM | POA: Diagnosis not present

## 2018-04-14 DIAGNOSIS — Z743 Need for continuous supervision: Secondary | ICD-10-CM | POA: Diagnosis not present

## 2018-04-14 DIAGNOSIS — A419 Sepsis, unspecified organism: Secondary | ICD-10-CM | POA: Diagnosis not present

## 2018-04-14 DIAGNOSIS — C9111 Chronic lymphocytic leukemia of B-cell type in remission: Secondary | ICD-10-CM | POA: Diagnosis not present

## 2018-04-14 DIAGNOSIS — R0902 Hypoxemia: Secondary | ICD-10-CM | POA: Diagnosis not present

## 2018-04-14 DIAGNOSIS — R000 Tachycardia, unspecified: Secondary | ICD-10-CM | POA: Diagnosis not present

## 2018-04-14 DIAGNOSIS — N281 Cyst of kidney, acquired: Secondary | ICD-10-CM | POA: Diagnosis not present

## 2018-04-14 DIAGNOSIS — J9811 Atelectasis: Secondary | ICD-10-CM | POA: Diagnosis not present

## 2018-04-14 DIAGNOSIS — M5416 Radiculopathy, lumbar region: Secondary | ICD-10-CM | POA: Diagnosis not present

## 2018-04-14 DIAGNOSIS — C9110 Chronic lymphocytic leukemia of B-cell type not having achieved remission: Secondary | ICD-10-CM | POA: Diagnosis not present

## 2018-04-14 DIAGNOSIS — B965 Pseudomonas (aeruginosa) (mallei) (pseudomallei) as the cause of diseases classified elsewhere: Secondary | ICD-10-CM | POA: Diagnosis not present

## 2018-04-14 DIAGNOSIS — A4152 Sepsis due to Pseudomonas: Secondary | ICD-10-CM | POA: Diagnosis not present

## 2018-04-14 DIAGNOSIS — E119 Type 2 diabetes mellitus without complications: Secondary | ICD-10-CM | POA: Diagnosis not present

## 2018-04-14 DIAGNOSIS — B9689 Other specified bacterial agents as the cause of diseases classified elsewhere: Secondary | ICD-10-CM | POA: Diagnosis not present

## 2018-04-14 DIAGNOSIS — N138 Other obstructive and reflux uropathy: Secondary | ICD-10-CM | POA: Diagnosis not present

## 2018-05-04 DIAGNOSIS — N281 Cyst of kidney, acquired: Secondary | ICD-10-CM | POA: Diagnosis not present

## 2018-05-04 DIAGNOSIS — Q619 Cystic kidney disease, unspecified: Secondary | ICD-10-CM | POA: Diagnosis not present

## 2018-05-04 DIAGNOSIS — N3001 Acute cystitis with hematuria: Secondary | ICD-10-CM | POA: Diagnosis not present

## 2018-05-04 DIAGNOSIS — A4152 Sepsis due to Pseudomonas: Secondary | ICD-10-CM | POA: Diagnosis not present

## 2018-05-04 DIAGNOSIS — G3281 Cerebellar ataxia in diseases classified elsewhere: Secondary | ICD-10-CM | POA: Diagnosis not present

## 2018-05-04 DIAGNOSIS — I25118 Atherosclerotic heart disease of native coronary artery with other forms of angina pectoris: Secondary | ICD-10-CM | POA: Diagnosis not present

## 2018-05-08 DIAGNOSIS — N3000 Acute cystitis without hematuria: Secondary | ICD-10-CM | POA: Diagnosis not present

## 2018-05-08 DIAGNOSIS — N138 Other obstructive and reflux uropathy: Secondary | ICD-10-CM | POA: Diagnosis not present

## 2018-05-08 DIAGNOSIS — E1142 Type 2 diabetes mellitus with diabetic polyneuropathy: Secondary | ICD-10-CM | POA: Diagnosis not present

## 2018-05-08 DIAGNOSIS — N401 Enlarged prostate with lower urinary tract symptoms: Secondary | ICD-10-CM | POA: Diagnosis not present

## 2018-05-08 DIAGNOSIS — A4152 Sepsis due to Pseudomonas: Secondary | ICD-10-CM | POA: Diagnosis not present

## 2018-05-08 DIAGNOSIS — M5416 Radiculopathy, lumbar region: Secondary | ICD-10-CM | POA: Diagnosis not present

## 2018-05-08 DIAGNOSIS — Z856 Personal history of leukemia: Secondary | ICD-10-CM | POA: Diagnosis not present

## 2018-05-08 DIAGNOSIS — Z9889 Other specified postprocedural states: Secondary | ICD-10-CM | POA: Diagnosis not present

## 2018-05-08 DIAGNOSIS — N281 Cyst of kidney, acquired: Secondary | ICD-10-CM | POA: Diagnosis not present

## 2018-05-09 DIAGNOSIS — N281 Cyst of kidney, acquired: Secondary | ICD-10-CM | POA: Diagnosis not present

## 2018-05-11 ENCOUNTER — Institutional Professional Consult (permissible substitution): Payer: 59 | Admitting: Neurology

## 2018-05-11 DIAGNOSIS — M67911 Unspecified disorder of synovium and tendon, right shoulder: Secondary | ICD-10-CM | POA: Diagnosis not present

## 2018-05-11 DIAGNOSIS — M25551 Pain in right hip: Secondary | ICD-10-CM | POA: Diagnosis not present

## 2018-05-11 DIAGNOSIS — M67912 Unspecified disorder of synovium and tendon, left shoulder: Secondary | ICD-10-CM | POA: Diagnosis not present

## 2018-05-18 DIAGNOSIS — J40 Bronchitis, not specified as acute or chronic: Secondary | ICD-10-CM | POA: Diagnosis not present

## 2018-05-18 DIAGNOSIS — Z87891 Personal history of nicotine dependence: Secondary | ICD-10-CM | POA: Diagnosis not present

## 2018-05-18 DIAGNOSIS — R05 Cough: Secondary | ICD-10-CM | POA: Diagnosis not present

## 2018-05-18 DIAGNOSIS — R531 Weakness: Secondary | ICD-10-CM | POA: Diagnosis not present

## 2018-05-18 DIAGNOSIS — J069 Acute upper respiratory infection, unspecified: Secondary | ICD-10-CM | POA: Diagnosis not present

## 2018-05-19 ENCOUNTER — Other Ambulatory Visit: Payer: Self-pay | Admitting: Orthopedic Surgery

## 2018-05-19 DIAGNOSIS — M67912 Unspecified disorder of synovium and tendon, left shoulder: Secondary | ICD-10-CM

## 2018-05-23 DIAGNOSIS — N281 Cyst of kidney, acquired: Secondary | ICD-10-CM | POA: Diagnosis not present

## 2018-05-23 DIAGNOSIS — R799 Abnormal finding of blood chemistry, unspecified: Secondary | ICD-10-CM | POA: Diagnosis not present

## 2018-05-30 DIAGNOSIS — N281 Cyst of kidney, acquired: Secondary | ICD-10-CM | POA: Diagnosis not present

## 2018-06-06 ENCOUNTER — Ambulatory Visit
Admission: RE | Admit: 2018-06-06 | Discharge: 2018-06-06 | Disposition: A | Discharge status: Home / Self Care | Payer: Medicare HMO | Source: Ambulatory Visit | Attending: Orthopedic Surgery | Admitting: Orthopedic Surgery

## 2018-06-06 DIAGNOSIS — N390 Urinary tract infection, site not specified: Secondary | ICD-10-CM | POA: Diagnosis not present

## 2018-06-06 DIAGNOSIS — M67912 Unspecified disorder of synovium and tendon, left shoulder: Secondary | ICD-10-CM

## 2018-06-06 DIAGNOSIS — M25512 Pain in left shoulder: Secondary | ICD-10-CM | POA: Diagnosis not present

## 2018-06-08 DIAGNOSIS — H25013 Cortical age-related cataract, bilateral: Secondary | ICD-10-CM | POA: Diagnosis not present

## 2018-06-08 DIAGNOSIS — H5211 Myopia, right eye: Secondary | ICD-10-CM | POA: Diagnosis not present

## 2018-06-08 DIAGNOSIS — H5202 Hypermetropia, left eye: Secondary | ICD-10-CM | POA: Diagnosis not present

## 2018-06-08 DIAGNOSIS — E119 Type 2 diabetes mellitus without complications: Secondary | ICD-10-CM | POA: Diagnosis not present

## 2018-06-15 DIAGNOSIS — H5213 Myopia, bilateral: Secondary | ICD-10-CM | POA: Diagnosis not present

## 2018-06-15 DIAGNOSIS — M67912 Unspecified disorder of synovium and tendon, left shoulder: Secondary | ICD-10-CM | POA: Diagnosis not present

## 2018-06-15 DIAGNOSIS — H5203 Hypermetropia, bilateral: Secondary | ICD-10-CM | POA: Diagnosis not present

## 2018-06-15 DIAGNOSIS — H524 Presbyopia: Secondary | ICD-10-CM | POA: Diagnosis not present

## 2018-06-15 DIAGNOSIS — H52209 Unspecified astigmatism, unspecified eye: Secondary | ICD-10-CM | POA: Diagnosis not present

## 2018-06-21 ENCOUNTER — Telehealth: Payer: Self-pay | Admitting: Hematology & Oncology

## 2018-06-21 NOTE — Telephone Encounter (Signed)
Faxed medical records to [facility] F: Bee [birth year] CLIENT ID: [id number]

## 2018-06-26 DIAGNOSIS — Q619 Cystic kidney disease, unspecified: Secondary | ICD-10-CM | POA: Diagnosis not present

## 2018-06-26 DIAGNOSIS — N138 Other obstructive and reflux uropathy: Secondary | ICD-10-CM | POA: Diagnosis not present

## 2018-06-26 DIAGNOSIS — N401 Enlarged prostate with lower urinary tract symptoms: Secondary | ICD-10-CM | POA: Diagnosis not present

## 2018-06-27 ENCOUNTER — Encounter: Payer: Self-pay | Admitting: Neurology

## 2018-06-28 ENCOUNTER — Ambulatory Visit (INDEPENDENT_AMBULATORY_CARE_PROVIDER_SITE_OTHER): Payer: Medicare HMO | Admitting: Neurology

## 2018-06-28 ENCOUNTER — Encounter: Payer: Self-pay | Admitting: Neurology

## 2018-06-28 VITALS — BP 139/87 | HR 74 | Ht 74.0 in | Wt 288.0 lb

## 2018-06-28 DIAGNOSIS — C9110 Chronic lymphocytic leukemia of B-cell type not having achieved remission: Secondary | ICD-10-CM

## 2018-06-28 DIAGNOSIS — F515 Nightmare disorder: Secondary | ICD-10-CM

## 2018-06-28 DIAGNOSIS — G4752 REM sleep behavior disorder: Secondary | ICD-10-CM

## 2018-06-28 DIAGNOSIS — G4730 Sleep apnea, unspecified: Secondary | ICD-10-CM

## 2018-06-28 DIAGNOSIS — R0683 Snoring: Secondary | ICD-10-CM

## 2018-06-28 DIAGNOSIS — I25110 Atherosclerotic heart disease of native coronary artery with unstable angina pectoris: Secondary | ICD-10-CM | POA: Diagnosis not present

## 2018-06-28 DIAGNOSIS — G4710 Hypersomnia, unspecified: Secondary | ICD-10-CM

## 2018-06-28 DIAGNOSIS — J208 Acute bronchitis due to other specified organisms: Secondary | ICD-10-CM

## 2018-06-28 NOTE — Progress Notes (Signed)
SLEEP MEDICINE CLINIC   Provider:  Larey Seat, M D  Primary Care Physician:  Deland Pretty, MD   Referring Provider: Deland Pretty, MD / Krista Blue , MD    Chief Complaint  Patient presents with  . New Patient (Initial Visit)    pt with wife, rm 35. pt has never had a sleep study before. pt's wife states he snores and she has witnessed apnea events in sleep.     HPI:  Ronald Rice is a 73 y.o. male patient of Dr. Krista Blue and Deland Pretty, MD , seen here for sleep evaluation on 06-28-2018 upon referral from Dr. Shelia Media.  Mr. Ned, who is seen here accompanied by his wife.  The patient had been in and out of the hospital recently, he suffers also from chronic myelogenous leukemia, currently in remission.  His oncologist is Dr. Vevelyn Pat.  He had multiple back and neck surgeries and carries also a diagnosis of hypertension, diabetes and hypercholesterolemia.  He presents here for sleepiness and snoring which has concerned his wife.  She has noted an unusual breathing pattern mostly a crescendo snoring that then is followed by an interrupted breathing pattern.   The patient feels that his sleep quality is not unusually poor, he just reports that he wakes up and usually early at about 5 AM and wanted.  Usually he is woken by the urge to urinate and then cannot go back to sleep.   He endorsed the Epworth sleepiness score at 8 points and the fatigue severity score at 19 points, the geriatric depression score was endorsed at 3 out of 15 points which is not a high level.  Chief complaint according to patient :The patient feels that his sleep quality is not unusually poor, he just reports that he wakes up and usually early at about 5 AM and wanted.  Usually he is woken by the urge to urinate and then cannot go back to sleep.   Sleep habits are as follows: Dinner time between 5 and 6 PM , followed by bedtime at 11 PM. He is asleep in less than 30 minutes, immediately snoring. Sleeps on his sides (  back pain ), on one pillow. He wakes up once for nocturia- at 5 AM. Seldomly dreams, he can;t recall, but his wife noted him talking. He sometimes moves in his dreams, has hallucinated while under medication influence- of gapapentin. He sweats excessively at night, notices that in AM when he goes to urinate.  Averages 6 hours of uninterrupted sleep with snoring and apnea not waking him.      Sleep medical history;Multivel fusion surgeries, back problems, weakness in legs ( see Dr. Krista Blue reported ) DM, cholesterol, HTN. Leukemia.   Family sleep history: several siblings living, no knowledge of snoring, OSA.    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  Re-Married, since 09-2017, 2 children from first marriage, one deceased - suicide.    Review of Systems: Out of a complete 14 system review, the patient complains of only the following symptoms, and all other reviewed systems are negative.  snoring, early morning arousals.  Nodding off in daytime- While watching TV  Epworth score 8/ 24  , Fatigue severity score 19/ 63  , depression score 5/15    Social History   Socioeconomic History  . Marital status: Single    Spouse name: Not on file  . Number of children: 1  . Years of education: 68  . Highest education level:  Not on file  Occupational History  . Occupation: Retired  Scientific laboratory technician  . Financial resource strain: Not on file  . Food insecurity:    Worry: Not on file    Inability: Not on file  . Transportation needs:    Medical: Not on file    Non-medical: Not on file  Tobacco Use  . Smoking status: Never Smoker  . Smokeless tobacco: Never Used  Substance and Sexual Activity  . Alcohol use: Yes    Frequency: Never    Comment: Rare  . Drug use: No  . Sexual activity: Not on file  Lifestyle  . Physical activity:    Days per week: Not on file    Minutes per session: Not on file  . Stress: Not on file  Relationships  . Social connections:     Talks on phone: Not on file    Gets together: Not on file    Attends religious service: Not on file    Active member of club or organization: Not on file    Attends meetings of clubs or organizations: Not on file    Relationship status: Not on file  . Intimate partner violence:    Fear of current or ex partner: Not on file    Emotionally abused: Not on file    Physically abused: Not on file    Forced sexual activity: Not on file  Other Topics Concern  . Not on file  Social History Narrative   Lives with wife   Caffeine use: Drinks coffee daily (2 cups)   Right handed     Family History  Problem Relation Age of Onset  . Heart attack Mother   . Diabetes Sister   . Diabetes Brother     Past Medical History:  Diagnosis Date  . Cancer ([place])    Leukemia   . CLL (chronic lymphocytic leukemia) ([place])    see records in care everywhere from [place]  . Coronary artery disease    minimal nonobstructive by 10/31/16 cath at [facility] in [place]  . Diabetes mellitus without complication ([place])   . GERD (gastroesophageal reflux disease)   . Hypertension     Past Surgical History:  Procedure Laterality Date  . ANTERIOR CERVICAL DECOMP/DISCECTOMY FUSION N/A 04/20/2017   Procedure: ANTERIOR CERVICAL DECOMPRESSION FUSION, CERVICAL 3-4, CERVICAL 4-5 WITH INSTRUMENTATION AND ALLOGRAFT; REQUEST 3.5 HOURS;  Surgeon: Phylliss Bob, MD;  Location: [facility];  Service: Orthopedics;  Laterality: N/A;  ANTERIOR CERVICAL DECOMPRESSION FUSION, CERVICAL 3-4, CERVICAL 4-5 WITH INSTRUMENTATION AND ALLOGRAFT; REQUEST 3.5 HOURS  . BACK SURGERY    . COLONOSCOPY      Current Outpatient Medications  Medication Sig Dispense Refill  . allopurinol (ZYLOPRIM) 300 MG tablet Take 300 mg by mouth daily.    Marland Kitchen amLODipine (NORVASC) 5 MG tablet Take 5 mg by mouth daily.    Marland Kitchen aspirin EC 81 MG tablet Take 81 mg by mouth daily.    Marland Kitchen atenolol (TENORMIN) 25 MG tablet Take 25 mg by mouth daily.     Marland Kitchen atorvastatin (LIPITOR) 40  MG tablet Take 40 mg by mouth daily.    Marland Kitchen CIALIS 20 MG tablet Take 20 mg by mouth as needed for erectile dysfunction.  6  . Cinnamon 500 MG TABS Take 500 mg by mouth daily.    . famotidine (PEPCID) 20 MG tablet Take 20 mg by mouth at bedtime.    Marland Kitchen ketoconazole (NIZORAL) 2 % cream Apply 1 application topically as needed for irritation.  0  . lisinopril-hydrochlorothiazide (PRINZIDE,ZESTORETIC) 20-25 MG tablet Take 1 tablet by mouth daily.     . Multiple Vitamins-Minerals (CENTRUM SILVER 50+MEN PO) Take 1 tablet by mouth daily.    . NON FORMULARY Take 1 tablet by mouth daily. Blood Sugar Defense     . potassium chloride (K-DUR) 10 MEQ tablet Take 10 mEq by mouth daily.     Marland Kitchen tiZANidine (ZANAFLEX) 4 MG tablet Take 4 mg by mouth every 12 (twelve) hours as needed for muscle spasms.  0   No current facility-administered medications for this visit.     Allergies as of 06/28/2018  . (No Known Allergies)    Vitals: BP 139/87   Pulse 74   Ht 6\' 2"  (1.88 m)   Wt 288 lb (130.6 kg)   BMI 36.98 kg/m  Last Weight:  Wt Readings from Last 1 Encounters:  06/28/18 288 lb (130.6 kg)   GMW:NUUV mass index is 36.98 kg/m.     Last Height:   Ht Readings from Last 1 Encounters:  06/28/18 6\' 2"  (1.88 m)    Physical exam:  General: The patient is awake, alert and appears not in acute distress. The patient has a 3- day- stubble. Head: Normocephalic, atraumatic. Neck is supple. Mallampati 3,  neck circumference: 20. Nasal airflow patent , Retrognathia is seen.  Cardiovascular:  Regular rate and rhythm , without  murmurs or carotid bruit, and without distended neck veins. Respiratory: Lungs are clear to auscultation. Skin:  Without evidence of edema, or rash Trunk: BMI is 37 . The patient's posture is leaning to the right.   Neurologic exam : The patient is awake and alert, oriented to place and time.    Attention span & concentration ability appears normal.  Speech is haltering, he seems to look for  words.  Mood and affect are appropriate.  Cranial nerves: Pupils are equally round and sized, and briskly reactive to light. Extraocular movements  in vertical and horizontal planes intact and without nystagmus. Visual fields by finger perimetry are intact. Hearing to finger rub impaired. Facial sensation intact to fine touch.  Facial motor strength is symmetric and tongue and uvula move midline. Shoulder shrug was symmetrical.   Motor exam:  Normal tone, muscle bulk and symmetric strength in all extremities. ROm in left shoulder reduced.  Sensory:  Fine touch, pinprick and vibration were tested in all extremities. Proprioception tested in the upper extremities was normal. Coordination: Finger-to-nose maneuver normal without evidence of ataxia, dysmetria . There is a mild symmetric resting tremor. Penmanship has changed.  Gait and station: Patient walks with a walker assistive device and is unable unassisted to climb up to the exam table.  Deep tendon reflexes: deferred.     Assessment:  After physical and neurologic examination, review of laboratory studies,  Personal review of imaging studies, reports of other /same  Imaging studies, results of polysomnography and / or neurophysiology testing and pre-existing records as far as provided in visit., my assessment is :    1) Mr. Cortopassi has been witnessed to have crescendo snoring, and interrupted nocturnal breathing so I have no doubt that sleep apnea is present but I do not know to which degree.  There are risk factors including a body mass index is has reached 37, he does have a larger than average neck circumference and his upper airway is moderately narrow.  Further noticed is mild retrognathia his lower jaw is smaller and he has an overbite.  This is an anatomical  risk factor for sleep apnea.    His nasal patency is preserved.  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  2) I believe that his lack of nocturia ( one bathroom break ) indicate a mild form of OSA. He sleeps 6 hours but naps in daytime. EDS is present.  3)He is also more likely to be fatigued as  a cancer patient, even in remission.   4) remote shift work history.   Reviewed medication , medical and family history.   The patient was advised of the nature of the diagnosed disorder , the treatment options and the  risks for general health and wellness arising from not treating the condition.   I spent more than 45 minutes of face to face time with the patient.  Greater than 50% of time was spent in counseling and coordination of care. We have discussed the diagnosis and differential and I answered the patient's questions.    Plan:  Treatment plan and additional workup :  Attended sleep study, SPLIT and watch for REM BD.      Larey Seat, MD 07/22/4968, 2:63 PM  Certified in Neurology by ABPN Certified in [place] by [facility] [address], [place], [place] [address]

## 2018-07-05 DIAGNOSIS — M7542 Impingement syndrome of left shoulder: Secondary | ICD-10-CM | POA: Diagnosis not present

## 2018-07-05 DIAGNOSIS — M7522 Bicipital tendinitis, left shoulder: Secondary | ICD-10-CM | POA: Diagnosis not present

## 2018-07-05 DIAGNOSIS — S46012A Strain of muscle(s) and tendon(s) of the rotator cuff of left shoulder, initial encounter: Secondary | ICD-10-CM | POA: Diagnosis not present

## 2018-07-05 DIAGNOSIS — G8918 Other acute postprocedural pain: Secondary | ICD-10-CM | POA: Diagnosis not present

## 2018-07-05 DIAGNOSIS — M19012 Primary osteoarthritis, left shoulder: Secondary | ICD-10-CM | POA: Diagnosis not present

## 2018-07-11 DIAGNOSIS — M67912 Unspecified disorder of synovium and tendon, left shoulder: Secondary | ICD-10-CM | POA: Diagnosis not present

## 2018-07-11 DIAGNOSIS — M67911 Unspecified disorder of synovium and tendon, right shoulder: Secondary | ICD-10-CM | POA: Diagnosis not present

## 2018-07-11 DIAGNOSIS — Z9889 Other specified postprocedural states: Secondary | ICD-10-CM | POA: Diagnosis not present

## 2018-07-11 DIAGNOSIS — Z09 Encounter for follow-up examination after completed treatment for conditions other than malignant neoplasm: Secondary | ICD-10-CM | POA: Diagnosis not present

## 2018-07-13 DIAGNOSIS — N281 Cyst of kidney, acquired: Secondary | ICD-10-CM | POA: Diagnosis not present

## 2018-07-16 ENCOUNTER — Ambulatory Visit (INDEPENDENT_AMBULATORY_CARE_PROVIDER_SITE_OTHER): Payer: Medicare HMO | Admitting: Neurology

## 2018-07-16 DIAGNOSIS — G4710 Hypersomnia, unspecified: Secondary | ICD-10-CM | POA: Diagnosis not present

## 2018-07-16 DIAGNOSIS — C9110 Chronic lymphocytic leukemia of B-cell type not having achieved remission: Secondary | ICD-10-CM

## 2018-07-16 DIAGNOSIS — G4730 Sleep apnea, unspecified: Secondary | ICD-10-CM

## 2018-07-16 DIAGNOSIS — R0683 Snoring: Secondary | ICD-10-CM

## 2018-07-16 DIAGNOSIS — J208 Acute bronchitis due to other specified organisms: Secondary | ICD-10-CM

## 2018-07-16 DIAGNOSIS — G4752 REM sleep behavior disorder: Secondary | ICD-10-CM

## 2018-07-16 DIAGNOSIS — G4731 Primary central sleep apnea: Secondary | ICD-10-CM

## 2018-07-16 DIAGNOSIS — I25110 Atherosclerotic heart disease of native coronary artery with unstable angina pectoris: Secondary | ICD-10-CM

## 2018-07-16 DIAGNOSIS — F515 Nightmare disorder: Secondary | ICD-10-CM

## 2018-07-18 DIAGNOSIS — M549 Dorsalgia, unspecified: Secondary | ICD-10-CM | POA: Diagnosis not present

## 2018-07-18 DIAGNOSIS — M7580 Other shoulder lesions, unspecified shoulder: Secondary | ICD-10-CM | POA: Diagnosis not present

## 2018-07-18 DIAGNOSIS — E119 Type 2 diabetes mellitus without complications: Secondary | ICD-10-CM | POA: Diagnosis not present

## 2018-07-18 DIAGNOSIS — I10 Essential (primary) hypertension: Secondary | ICD-10-CM | POA: Diagnosis not present

## 2018-07-21 DIAGNOSIS — N281 Cyst of kidney, acquired: Secondary | ICD-10-CM | POA: Diagnosis not present

## 2018-07-24 DIAGNOSIS — M25612 Stiffness of left shoulder, not elsewhere classified: Secondary | ICD-10-CM | POA: Diagnosis not present

## 2018-07-24 DIAGNOSIS — M6281 Muscle weakness (generalized): Secondary | ICD-10-CM | POA: Diagnosis not present

## 2018-07-26 ENCOUNTER — Telehealth: Payer: Self-pay | Admitting: Neurology

## 2018-07-26 DIAGNOSIS — G4730 Sleep apnea, unspecified: Secondary | ICD-10-CM

## 2018-07-26 DIAGNOSIS — G4710 Hypersomnia, unspecified: Secondary | ICD-10-CM | POA: Insufficient documentation

## 2018-07-26 DIAGNOSIS — G4752 REM sleep behavior disorder: Secondary | ICD-10-CM | POA: Insufficient documentation

## 2018-07-26 DIAGNOSIS — R0683 Snoring: Secondary | ICD-10-CM | POA: Insufficient documentation

## 2018-07-26 DIAGNOSIS — F515 Nightmare disorder: Secondary | ICD-10-CM

## 2018-07-26 NOTE — Telephone Encounter (Signed)
-----   Message from Larey Seat, MD sent at 07/26/2018  8:26 AM EST ----- IMPRESSION:  1. Central Sleep Apnea, Complex sleep apnea, severe at AHI 71/h.  accentuated in supine sleep, and NON REM sleep.  2. Prolonged hypoxemia, at 78 minutes total desaturation time.     RECOMMENDATIONS:  1. Advise full-night, attended, PAP titration study to optimize  therapy. Central sleep apnea may not respond to CPAP and other  modalities can only be used while in lab.

## 2018-07-26 NOTE — Procedures (Signed)
PATIENT'S NAME:  Ronald Rice, Ronald Rice DOB:      May 22, 1946      MR#:    [id number]     DATE OF RECORDING: 07/16/2018 REFERRING M.D.:  Deland Pretty MD Study Performed:   Baseline Polysomnogram HISTORY:  Ronald Rice is a 73 y.o. male patient of Dr. Krista Blue and Deland Pretty, MD, seen here for sleep evaluation on 06-28-2018 upon referral from Dr. Shelia Media.   [name] had been in and out of the hospital recently, he suffers also from CM leukemia, currently in remission.  His oncologist is Dr. Burney Gauze.  He had multiple back and neck surgeries and carries also a diagnosis of hypertension, diabetes and hypercholesterolemia.  He presents here for sleepiness and snoring which has concerned his wife.  She has noted an unusual breathing pattern mostly a crescendo snoring that then is followed by an interrupted breathing pattern.   The patient feels that his sleep quality is not unusually poor, he just reports that he wakes up and usually early at about 5 AM, unwanted.  Usually he is woken by the urge to urinate and then cannot go back to sleep.   He endorsed the Epworth sleepiness score at 8 points and the fatigue severity score at 19 points, the geriatric depression score was endorsed at 3 out of 15 points which is not a high level.  Medical history; Multilevel fusion surgeries, back problems, weakness in legs (see Dr. Krista Blue reported) DM, cholesterol, HTN. Leukemia.  The patient's weight 289 pounds with a height of 74 (inches), resulting in a BMI of 37.1 kg/m2.The patient's neck circumference measured 20 inches.  CURRENT MEDICATIONS: Zyloprim, Norvasc, Aspirin, Tenormin, Lipitor, Cialis, Pepcid, Nizoral, Prinzide, Zanaflex   PROCEDURE:  This is a multichannel digital polysomnogram utilizing the Somnostar 11.2 system.  Electrodes and sensors were applied and monitored per AASM Specifications.   EEG, EOG, Chin and Limb EMG, were sampled at 200 Hz.  ECG, Snore and Nasal Pressure, Thermal Airflow, Respiratory Effort, CPAP Flow  and Pressure, Oximetry was sampled at 50 Hz. Digital video and audio were recorded.      BASELINE STUDY: Lights Out was at 22:31 and Lights On at 05:00.  Total recording time (TRT) was 389 minutes, with a total sleep time (TST) of 196 minutes. The patient's sleep latency was 191 minutes.  REM latency was 170 minutes.  The sleep efficiency was 50.4 %.     SLEEP ARCHITECTURE: WASO (Wake after sleep onset) was 167 minutes.  There were 108 minutes in Stage N1, 41 minutes Stage N2, 3 minutes Stage N3 and 44 minutes in Stage REM.  The percentage of Stage N1 was 55.1%, Stage N2 was 20.9%, Stage N3 was 1.5% and Stage R (REM sleep) was 22.4%.   RESPIRATORY ANALYSIS:  There were a total of 232 respiratory events:  30 obstructive apneas, 55 central apneas and 28 mixed apneas with a total of 113 apneas and an apnea index (AI) of 34.6 /hour. There were 119 hypopneas with a hypopnea index of 36.4 /hour. The patient also had 9 respiratory event related arousals (RERAs).      The total APNEA/HYPOPNEA INDEX (AHI) was 71.0 /hour and the total RESPIRATORY DISTURBANCE INDEX was 73.8 /hour.  46 events occurred in REM sleep and 242 events in NREM. The REM AHI was 62.7 /hour, versus a non-REM AHI of 73.4. The patient spent 122.5 minutes of total sleep time in the supine position and 74 minutes in non-supine. The supine AHI was 78.9 versus a  non-supine AHI of 57.9.  OXYGEN SATURATION & C02:  The Wake baseline 02 saturation was 95%, with the lowest being 75%. Time spent below 89% saturation equaled 84 minutes.   PERIODIC LIMB MOVEMENTS:  The arousals were noted as: 36 were spontaneous, 0 were associated with PLMs, and 190 were associated with respiratory events. The patient had a total of 0 Periodic Limb Movements.    Audio and video analysis did not show any abnormal or unusual movements, behaviors, phonations or vocalizations.   Some Snoring was noted.  EKG was in keeping with normal sinus rhythm  (NSR).   IMPRESSION:  1. Central Sleep Apnea, Complex sleep apnea, severe at AHI 71/h. accentuated in supine sleep, and NON REM sleep.  2. Prolonged hypoxemia, at 78 minutes total desaturation time.     RECOMMENDATIONS:  1. Advise full-night, attended, PAP titration study to optimize therapy.  Central sleep apnea may not respond to CPAP and other modalities can only be used while in lab.    I certify that I have reviewed the entire raw data recording prior to the issuance of this report in accordance with the Standards of Accreditation of the American Academy of Sleep Medicine (AASM)    Larey Seat, MD   07-26-2018 Diplomat, American Board of Psychiatry and Neurology  Diplomat, American Board of [place] Director, [place] [facility] [employer]

## 2018-07-26 NOTE — Telephone Encounter (Signed)

## 2018-07-26 NOTE — Addendum Note (Signed)
Addended by: Larey Seat on: 07/26/2018 08:27 AM   Modules accepted: Orders

## 2018-07-27 ENCOUNTER — Telehealth: Payer: Self-pay | Admitting: Hematology & Oncology

## 2018-07-27 NOTE — Telephone Encounter (Signed)
lmom to inform of patient next appts 5/8 at 10 am per phone message 3/5

## 2018-07-28 DIAGNOSIS — M25612 Stiffness of left shoulder, not elsewhere classified: Secondary | ICD-10-CM | POA: Diagnosis not present

## 2018-07-28 DIAGNOSIS — M6281 Muscle weakness (generalized): Secondary | ICD-10-CM | POA: Diagnosis not present

## 2018-08-01 DIAGNOSIS — M6281 Muscle weakness (generalized): Secondary | ICD-10-CM | POA: Diagnosis not present

## 2018-08-01 DIAGNOSIS — M25612 Stiffness of left shoulder, not elsewhere classified: Secondary | ICD-10-CM | POA: Diagnosis not present

## 2018-08-01 DIAGNOSIS — Z9889 Other specified postprocedural states: Secondary | ICD-10-CM | POA: Diagnosis not present

## 2018-08-04 DIAGNOSIS — M6281 Muscle weakness (generalized): Secondary | ICD-10-CM | POA: Diagnosis not present

## 2018-08-04 DIAGNOSIS — M25612 Stiffness of left shoulder, not elsewhere classified: Secondary | ICD-10-CM | POA: Diagnosis not present

## 2018-08-17 DIAGNOSIS — M25512 Pain in left shoulder: Secondary | ICD-10-CM | POA: Diagnosis not present

## 2018-08-21 DIAGNOSIS — N281 Cyst of kidney, acquired: Secondary | ICD-10-CM | POA: Diagnosis not present

## 2018-08-29 ENCOUNTER — Telehealth: Payer: Self-pay

## 2018-08-29 ENCOUNTER — Encounter: Payer: Self-pay | Admitting: Neurology

## 2018-08-29 DIAGNOSIS — R0683 Snoring: Secondary | ICD-10-CM

## 2018-08-29 DIAGNOSIS — G4731 Primary central sleep apnea: Secondary | ICD-10-CM

## 2018-08-29 NOTE — Telephone Encounter (Signed)
Patient had PSG 07/16/2018. AHI 71.0. He had some centrals but not over 50%. He is waiting a cpap titration. Can he get an auto order 5-15 and follow up appointment instead?

## 2018-08-29 NOTE — Telephone Encounter (Signed)
I called the patient and informed him due to COVID 19 we are not able to offer in lab sleep studies in our sleep lab due to it being closed. Dr. Brett Fairy recommends that pt start auto CPAP 5-15 cm water pressure. I reviewed PAP compliance expectations with the pt. Pt is agreeable to starting a CPAP. I advised pt that an order will be sent to a DME, [facility], and [facility] will call the pt within about one week after they file with the pt's insurance. [facility] will show the pt how to use the machine, fit for masks, and troubleshoot the CPAP if needed. A follow up appt was made for insurance purposes with Debbora Presto, NP on June 11,2020 at 1:00 pm. Pt verbalized understanding to arrive 15 minutes early and bring their CPAP. A letter with all of this information in it will be mailed to the pt as a reminder. I verified with the pt that the address we have on file is correct. Pt verbalized understanding of results. Pt had no questions at this time but was encouraged to call back if questions arise. I have sent the order to [facility] and have received confirmation that they have received the order.

## 2018-09-10 ENCOUNTER — Emergency Department (HOSPITAL_BASED_OUTPATIENT_CLINIC_OR_DEPARTMENT_OTHER): Payer: Medicare HMO

## 2018-09-10 ENCOUNTER — Other Ambulatory Visit: Payer: Self-pay

## 2018-09-10 ENCOUNTER — Emergency Department (HOSPITAL_BASED_OUTPATIENT_CLINIC_OR_DEPARTMENT_OTHER)
Admission: EM | Admit: 2018-09-10 | Discharge: 2018-09-10 | Disposition: A | Discharge status: Home / Self Care | Payer: Medicare HMO | Attending: Emergency Medicine | Admitting: Emergency Medicine

## 2018-09-10 ENCOUNTER — Encounter (HOSPITAL_BASED_OUTPATIENT_CLINIC_OR_DEPARTMENT_OTHER): Payer: Self-pay | Admitting: Emergency Medicine

## 2018-09-10 DIAGNOSIS — Y9389 Activity, other specified: Secondary | ICD-10-CM | POA: Insufficient documentation

## 2018-09-10 DIAGNOSIS — Z7982 Long term (current) use of aspirin: Secondary | ICD-10-CM | POA: Diagnosis not present

## 2018-09-10 DIAGNOSIS — W108XXA Fall (on) (from) other stairs and steps, initial encounter: Secondary | ICD-10-CM | POA: Insufficient documentation

## 2018-09-10 DIAGNOSIS — Y92008 Other place in unspecified non-institutional (private) residence as the place of occurrence of the external cause: Secondary | ICD-10-CM | POA: Insufficient documentation

## 2018-09-10 DIAGNOSIS — I10 Essential (primary) hypertension: Secondary | ICD-10-CM | POA: Diagnosis not present

## 2018-09-10 DIAGNOSIS — S82442A Displaced spiral fracture of shaft of left fibula, initial encounter for closed fracture: Secondary | ICD-10-CM | POA: Insufficient documentation

## 2018-09-10 DIAGNOSIS — E119 Type 2 diabetes mellitus without complications: Secondary | ICD-10-CM | POA: Diagnosis not present

## 2018-09-10 DIAGNOSIS — I259 Chronic ischemic heart disease, unspecified: Secondary | ICD-10-CM | POA: Diagnosis not present

## 2018-09-10 DIAGNOSIS — Y999 Unspecified external cause status: Secondary | ICD-10-CM | POA: Insufficient documentation

## 2018-09-10 DIAGNOSIS — Z79899 Other long term (current) drug therapy: Secondary | ICD-10-CM | POA: Insufficient documentation

## 2018-09-10 DIAGNOSIS — S82832A Other fracture of upper and lower end of left fibula, initial encounter for closed fracture: Secondary | ICD-10-CM | POA: Diagnosis not present

## 2018-09-10 DIAGNOSIS — M79606 Pain in leg, unspecified: Secondary | ICD-10-CM | POA: Diagnosis not present

## 2018-09-10 DIAGNOSIS — S8992XA Unspecified injury of left lower leg, initial encounter: Secondary | ICD-10-CM | POA: Diagnosis present

## 2018-09-10 MED ORDER — TRAMADOL HCL 50 MG PO TABS
50.0000 mg | ORAL_TABLET | Freq: Once | ORAL | Status: AC
  Administered 2018-09-10: 19:00:00 50 mg via ORAL
  Filled 2018-09-10: qty 1

## 2018-09-10 MED ORDER — HYDROCODONE-ACETAMINOPHEN 5-325 MG PO TABS: 1.0000 | ORAL_TABLET | Freq: Four times a day (QID) | ORAL | 0 refills | Status: DC | PRN

## 2018-09-10 NOTE — Discharge Instructions (Addendum)
You can take 1000 mg of Tylenol.  Do not exceed 4000 mg of Tylenol a day.  Take pain medications as directed for break through pain. Do not drive or operate machinery while taking this medication.   Follow-up with referred orthopedic doctor.    Make sure you are keeping the splint clean and dry.  Do not get it wet.  Make sure you are elevating the foot as much as possible.  Use crutches as directed.  Return emergency department for any worsening pain, discoloration of the toes or any other worsening or concerning symptoms.

## 2018-09-10 NOTE — ED Provider Notes (Addendum)
[facility] EMERGENCY DEPARTMENT Provider Note   CSN: [id number] Arrival date & time: 09/10/18  1712    History   Chief Complaint Chief Complaint  Patient presents with   Fall    HPI Ronald Rice is a 73 y.o. male with PMH/o CLL, CAD, DM, GERD, HTN who presents for evaluation of LLE and right shoulder pain s/p a mechanical fall that occurred just prior to ED arrival. Patient reports that he was walking down three steps and slipped off the top step, causing him to fall. He states that he twisted his LLE. He was trying to catch himself by grabbing the rail and pulled his right shoulder. He states he did not hit his head or have any LOC. Patient report he was assisted off the floor by his neighbors. He now reports pain to the LLE and right shoulder. He has been able to bear a little weight on his LLE. Patient is currently not on blood thinners. He denies any vision changes, CP, shortness of breath, abdominal pain, nausea/vomiting, neck pain, back pain, numbness/weakness of extremities.     The history is provided by the patient.    Past Medical History:  Diagnosis Date   Cancer ([place])    Leukemia    CLL (chronic lymphocytic leukemia) ([place])    see records in care everywhere from [facility]   Coronary artery disease    minimal nonobstructive by 10/31/16 cath at [facility] in [place]   Diabetes mellitus without complication [facility])    GERD (gastroesophageal reflux disease)    Hypertension     Patient Active Problem List   Diagnosis Date Noted   Dream enactment behavior 07/26/2018   Snoring 07/26/2018   Hypersomnia with sleep apnea 07/26/2018   Fever    Acute cystitis with hematuria    Ischemic stroke ([place]) 11/22/2017   Right hip pain 07/18/2017   Cerebellar ataxia in diseases classified elsewhere ([place]) 07/18/2017   Radiculopathy 04/20/2017   Chronic lymphoid leukemia ([place]) 01/25/2017   CAD (coronary artery disease) 10/31/2016   Influenza A 05/24/2016    Acute bronchitis 05/22/2016   Chronic leukemia ([place]) 05/22/2016   Weakness 05/22/2016   Sepsis ([place]) 05/22/2016   Pedal edema 05/22/2016   Gait abnormality 07/28/2015   Nonspecific elevation of levels of transaminase and lactic acid dehydrogenase (LDH) 03/02/2015   Pseudarthrosis after fusion or arthrodesis 06/03/2014   Fusion of spine of cervical region 06/05/2013   Fusion of lumbar spine 03/07/2012   Benign prostatic hyperplasia 06/15/2011   Liver cyst 07/29/2009   Kidney cyst, acquired 06/17/2009   Type 2 diabetes mellitus without complications ([place]) 11/94/1740   Diverticulosis of colon 08/03/2007   Hyperlipidemia 03/14/2007   Neuropathy, peripheral 12/03/2005   Allergic rhinitis 03/01/2000   Essential (primary) hypertension 10/03/1996   GERD (gastroesophageal reflux disease) 07/02/1995    Past Surgical History:  Procedure Laterality Date   ANTERIOR CERVICAL DECOMP/DISCECTOMY FUSION N/A 04/20/2017   Procedure: ANTERIOR CERVICAL DECOMPRESSION FUSION, CERVICAL 3-4, CERVICAL 4-5 WITH INSTRUMENTATION AND ALLOGRAFT; REQUEST 3.5 HOURS;  Surgeon: Phylliss Bob, MD;  Location: [facility];  Service: Orthopedics;  Laterality: N/A;  ANTERIOR CERVICAL DECOMPRESSION FUSION, CERVICAL 3-4, CERVICAL 4-5 WITH INSTRUMENTATION AND ALLOGRAFT; REQUEST 3.5 HOURS   BACK SURGERY     COLONOSCOPY          Home Medications    Prior to Admission medications   Medication Sig Start Date End Date Taking? Authorizing Provider  allopurinol (ZYLOPRIM) 300 MG tablet Take 300 mg by mouth daily.  [provider]  amLODipine (NORVASC) 5 MG tablet Take 5 mg by mouth daily.    [provider]  aspirin EC 81 MG tablet Take 81 mg by mouth daily.    [provider]  atenolol (TENORMIN) 25 MG tablet Take 25 mg by mouth daily.     [provider]  atorvastatin (LIPITOR) 40 MG tablet Take 40 mg by mouth daily.    [provider]  CIALIS 20 MG tablet  Take 20 mg by mouth as needed for erectile dysfunction. 10/06/17   [provider]  Cinnamon 500 MG TABS Take 500 mg by mouth daily.    [provider]  famotidine (PEPCID) 20 MG tablet Take 20 mg by mouth at bedtime.    [provider]  HYDROcodone-acetaminophen (NORCO/VICODIN) 5-325 MG tablet Take 1-2 tablets by mouth every 6 (six) hours as needed. 09/10/18   Volanda Napoleon, PA-C  ketoconazole (NIZORAL) 2 % cream Apply 1 application topically as needed for irritation. 11/02/17   [provider]  lisinopril-hydrochlorothiazide (PRINZIDE,ZESTORETIC) 20-25 MG tablet Take 1 tablet by mouth daily.     [provider]  Multiple Vitamins-Minerals (CENTRUM SILVER 50+MEN PO) Take 1 tablet by mouth daily.    [provider]  NON FORMULARY Take 1 tablet by mouth daily. Blood Sugar Defense     [provider]  potassium chloride (K-DUR) 10 MEQ tablet Take 10 mEq by mouth daily.     [provider]  tiZANidine (ZANAFLEX) 4 MG tablet Take 4 mg by mouth every 12 (twelve) hours as needed for muscle spasms. 11/02/17   [provider]    Family History Family History  Problem Relation Age of Onset   Heart attack Mother    Diabetes Sister    Diabetes Brother     Social History Social History   Tobacco Use   Smoking status: Never Smoker   Smokeless tobacco: Never Used  Substance Use Topics   Alcohol use: Yes    Frequency: Never    Comment: Rare   Drug use: No     Allergies   Patient has no known allergies.   Review of Systems Review of Systems  Eyes: Negative for visual disturbance.  Respiratory: Negative for shortness of breath.   Cardiovascular: Negative for chest pain.  Gastrointestinal: Negative for abdominal pain, nausea and vomiting.  Genitourinary: Negative for dysuria and hematuria.  Musculoskeletal: Negative for back pain and neck pain.       LLE pain Right shoulder pain  Neurological:  Negative for weakness, numbness and headaches.  All other systems reviewed and are negative.    Physical Exam Updated Vital Signs BP 129/80    Pulse 71    Temp 98.2 F (36.8 C) (Oral)    Resp 18    Ht 6\' 2"  (1.88 m)    Wt 128.8 kg    SpO2 97%    BMI 36.46 kg/m   Physical Exam Vitals signs and nursing note reviewed.  Constitutional:      Appearance: Normal appearance. He is well-developed.  HENT:     Head: Normocephalic and atraumatic.  Eyes:     General: Lids are normal.     Conjunctiva/sclera: Conjunctivae normal.     Pupils: Pupils are equal, round, and reactive to light.  Neck:     Musculoskeletal: Full passive range of motion without pain.     Comments: Full flexion/extension and lateral movement of neck fully intact. No bony midline tenderness. No  deformities or crepitus.  Cardiovascular:     Rate and Rhythm: Normal rate and regular rhythm.     Pulses: Normal pulses.          Radial pulses are 2+ on the right side and 2+ on the left side.       Dorsalis pedis pulses are 2+ on the right side and 2+ on the left side.     Heart sounds: Normal heart sounds. No murmur. No friction rub. No gallop.   Pulmonary:     Effort: Pulmonary effort is normal.     Breath sounds: Normal breath sounds.     Comments: Lungs clear to auscultation bilaterally.  Symmetric chest rise.  No wheezing, rales, rhonchi. Abdominal:     Palpations: Abdomen is soft. Abdomen is not rigid.     Tenderness: There is no abdominal tenderness. There is no guarding.     Comments: Abdomen is soft, non-distended, non-tender. No rigidity, No guarding. No peritoneal signs.  Musculoskeletal: Normal range of motion.     Comments: No pelvic instability noted.  Tenderness palpation noted in the distal and mid left tib-fib.  No proximal tib-fib tenderness.  No bony tenderness noted to knee.  No tenderness noted to left hip.  No tenderness palpation noted to right lower extremity.  Diffuse tenderness palpation noted to  right shoulder.  No deformity or crepitus noted.  Limited range of motion secondary to pain.  No tenderness palpation noted to right elbow, right wrist.  No tenderness palpation noted to left upper extremity.  No midline T or L-spine tenderness.  No deformity or crepitus noted.  Skin:    General: Skin is warm and dry.     Capillary Refill: Capillary refill takes less than 2 seconds.     Comments: Good distal cap refill. RLE is not dusky in appearance or cool to touch.  Neurological:     Mental Status: He is alert and oriented to person, place, and time.     Comments: Cranial nerves III-XII intact Follows commands, Moves all extremities  5/5 strength to BUE and BLE  Sensation intact throughout all major nerve distributions No slurred speech. No facial droop.   Psychiatric:        Speech: Speech normal.      ED Treatments / Results  Labs (all labs ordered are listed, but only abnormal results are displayed) Labs Reviewed - No data to display  EKG None  Radiology Dg Shoulder Right  Result Date: 09/10/2018 CLINICAL DATA:  Leg pain EXAM: RIGHT SHOULDER - 2+ VIEW COMPARISON:  MR shoulder 06/08/2017 FINDINGS: There is no evidence of fracture or dislocation. There is no evidence of arthropathy or other focal bone abnormality. Soft tissues are unremarkable. IMPRESSION: Negative. Electronically Signed   By: Kathreen Devoid   On: 09/10/2018 18:25   Dg Tibia/fibula Left  Result Date: 09/10/2018 CLINICAL DATA:  Ankle pain and swelling after falling on some steps. EXAM: LEFT TIBIA AND FIBULA - 2 VIEW COMPARISON:  None. FINDINGS: Short spiral fracture of the distal fibula noted proximal to the ankle mortise. Approximately 1/2 shaft with lateral displacement of the distal fragment. No associated tibia fracture identified. No substantial subluxation at the tibiotalar joint evident on these films. Soft tissue swelling noted about the ankle. IMPRESSION: Fracture of the distal fibular diaphysis, proximal  to the ankle mortise. No tibia fracture evident. No dislocation at the tibiotalar joint. Electronically Signed   By: Misty Stanley M.D.   On: 09/10/2018 18:24  Dg Ankle Complete Left  Result Date: 09/10/2018 CLINICAL DATA:  [place] on some steps.  Ankle pain. EXAM: LEFT ANKLE COMPLETE - 3+ VIEW COMPARISON:  None. FINDINGS: Acute fracture of the distal fibular diaphysis identified, proximal to the ankle mortise. Approximately 1/2 shaft with lateral displacement of the distal fracture fragment. No dislocation of the tibiotalar joint. Potential minimal widening of the lateral joint space. No other acute fracture evident. IMPRESSION: Fracture of the distal fibular diaphysis, proximal to the ankle mortise. No other fracture evident. No evidence of dislocation. Electronically Signed   By: Misty Stanley M.D.   On: 09/10/2018 18:26    Procedures Procedures (including critical care time)  Medications Ordered in ED Medications  traMADol (ULTRAM) tablet 50 mg (50 mg Oral Given 09/10/18 1912)     Initial Impression / Assessment and Plan / ED Course  I have reviewed the triage vital signs and the nursing notes.  Pertinent labs & imaging results that were available during my care of the patient were reviewed by me and considered in my medical decision making (see chart for details).        73 y.o. M who presents for evaluation presents for evaluation of left lower extremity and right shoulder pain after mechanical fall.  No preceding chest pain or dizziness.  No head injury, LOC.  He is not currently on blood thinners. Patient is afebrile, non-toxic appearing, sitting comfortably on examination table. Vital signs reviewed and stable.  On exam, he has tenderness palpation in the distal left lower extremity as well as the right shoulder.  Plan for x-ray imaging. No tenderness to palpation of skull. No deformities or crepitus noted. No open wounds, abrasions or lacerations.   X-ray reviewed.  Fracture of the  distal fibular diaphysis proximal to the ankle.  No other fracture evidence.  Right her x-ray is negative for any acute bony abnormality.  Updated patient on results.  Will plan to splint.  Evaluation after splint placement.  Patient placed in posterior leg splint. Patient with good distal sensation, cap refill.  Patient instructed to follow-up with outpatient orthopedics. At this time, patient exhibits no emergent life-threatening condition that require further evaluation in ED or admission. Patient had ample opportunity for questions and discussion. All patient's questions were answered with full understanding. Strict return precautions discussed. Patient expresses understanding and agreement to plan.   Portions of this note were generated with [employer]. Dictation errors may occur despite best attempts at proofreading.  Final Clinical Impressions(s) / ED Diagnoses   Final diagnoses:  Closed displaced spiral fracture of shaft of left fibula, initial encounter    ED Discharge Orders         Ordered    HYDROcodone-acetaminophen (NORCO/VICODIN) 5-325 MG tablet  Every 6 hours PRN     09/10/18 1950           Desma Mcgregor 09/10/18 2128    Drenda Freeze, MD 09/10/18 2158    Volanda Napoleon, PA-C 10/02/18 5449    Drenda Freeze, MD 10/05/18 1057

## 2018-09-10 NOTE — ED Notes (Signed)
RN Chanin and EMT Belenda Cruise wheeled patient to car and assisted patient into vehicle. Pt and wife understanding of care instructions.

## 2018-09-10 NOTE — ED Notes (Signed)
PMS intact before and after. Pt tolerated well. All questions answered. 

## 2018-09-10 NOTE — ED Triage Notes (Signed)
Reports to ER for fall today.  C/o injury to left lower leg and right shoulder.  Denies head injury, LOC, blood thinners.

## 2018-09-10 NOTE — ED Notes (Signed)
Splint placed with instructions. Voiced understanding. Ice Pack given

## 2018-09-10 NOTE — ED Notes (Signed)
Discharge instructions given at length to patient and his wife. Voiced understanding.

## 2018-09-11 DIAGNOSIS — M67912 Unspecified disorder of synovium and tendon, left shoulder: Secondary | ICD-10-CM | POA: Diagnosis not present

## 2018-09-11 DIAGNOSIS — M25572 Pain in left ankle and joints of left foot: Secondary | ICD-10-CM | POA: Diagnosis not present

## 2018-09-12 ENCOUNTER — Other Ambulatory Visit: Payer: Self-pay | Admitting: Orthopedic Surgery

## 2018-09-12 ENCOUNTER — Other Ambulatory Visit: Payer: Self-pay

## 2018-09-12 ENCOUNTER — Encounter (HOSPITAL_COMMUNITY): Payer: Self-pay | Admitting: *Deleted

## 2018-09-12 DIAGNOSIS — S8262XA Displaced fracture of lateral malleolus of left fibula, initial encounter for closed fracture: Secondary | ICD-10-CM | POA: Diagnosis not present

## 2018-09-12 NOTE — Progress Notes (Signed)
Pt denies SOB, chest pain, and being under the care of a cardiologist. Pt denies having recent labs. Pt stated that he took Aspirin today and was not given pre-op instructions regarding Aspirin. Pt made aware to stop taking  Vitamins, Cinnamon, BS Defense, fish oil and herbal medications. Do not take any NSAIDs ie: Ibuprofen, Advil, Naproxen (Aleve), Motrin, BC and Goody Powder. Pt stated that he does not take medication for diabetes and does not check his blood glucose.  Coronavirus Screening  Pt denies that he and his spouse experienced the following symptoms:  Cough yes/no: No Fever (>100.110F)  yes/no: No Runny nose yes/no: No Sore throat yes/no: No Difficulty breathing/shortness of breath  yes/no: No  Have you or a family member traveled in the last 14 days and where? yes/no: No   Pt reminded that hospital visitation restrictions are in effect and the importance of the restrictions. Pt verbalized understanding of all pre-op instructions. PA, Anesthesiology asked to review pt history; see note.

## 2018-09-12 NOTE — Anesthesia Preprocedure Evaluation (Addendum)
Anesthesia Evaluation  Patient identified by MRN, date of birth, ID band Patient awake    Reviewed: Allergy & Precautions, H&P , NPO status , Patient's Chart, lab work & pertinent test results  Airway Mallampati: II   Neck ROM: full    Dental   Pulmonary sleep apnea , former smoker,    breath sounds clear to auscultation       Cardiovascular hypertension, + CAD   Rhythm:regular Rate:Normal     Neuro/Psych    GI/Hepatic GERD  ,  Endo/Other  diabetes, Type 2  Renal/GU      Musculoskeletal   Abdominal   Peds  Hematology CLL   Anesthesia Other Findings   Reproductive/Obstetrics                            Anesthesia Physical Anesthesia Plan  ASA: III  Anesthesia Plan: General   Post-op Pain Management:  Regional for Post-op pain   Induction: Intravenous  PONV Risk Score and Plan: 2 and Ondansetron, Dexamethasone, Midazolam and Treatment may vary due to age or medical condition  Airway Management Planned: Oral ETT  Additional Equipment:   Intra-op Plan:   Post-operative Plan: Extubation in OR  Informed Consent: I have reviewed the patients History and Physical, chart, labs and discussed the procedure including the risks, benefits and alternatives for the proposed anesthesia with the patient or authorized representative who has indicated his/her understanding and acceptance.       Plan Discussed with: Anesthesiologist, CRNA and Surgeon  Anesthesia Plan Comments: (PAT note written 09/12/2018 by Myra Gianotti, PA-C. )       Anesthesia Quick Evaluation

## 2018-09-12 NOTE — Progress Notes (Signed)
Anesthesia Chart Review: Ronald Rice   Case:  [id number] Date/Time:  09/13/18 0714   Procedure:  OPEN REDUCTION INTERNAL FIXATION (ORIF)LEFT ANKLE FRACTURE (Left )   Anesthesia type:  Spinal   Pre-op diagnosis:  DISPLACED LEFT DISTAL FIBULA FRACTURE   Location:  [facility] OR ROOM 10 / [place] OR   Surgeon:  Dorna Leitz, MD      DISCUSSION: Patient is a 73 year old male scheduled for the above procedure. He presented to ED on 09/10/18 following fall with left distal fibula fracture. Splint applied with out-patient orthopedic follow-up.   History includes never smoker, CAD (minimal nonobstructive by 11/01/16 cath), HTN, DM2, CLL-stage 0 (observation as of 09/2017), GERD, C3-4/C5-6 ACDF 04/20/17, BPH. BMI is consistent with obesity.  - Hospitalization 11/22/17-11/26/17 for confusion and gait difficultly. CT head was negative, but due to concern for posterior circulation CVA patient was treated with TPA. MRI was negative so possible aborted CVA versus stroke mimic. CTA neck and echo showed no source of emboli. Further work-up revealed urosepsis (recent long-term catheter for bladder outlet obstruction) and possible cause of his symptoms. Urine culture grew Pseudomonas aeruginosa. No specific neurology follow-up recommended by Rosalin Hawking, MD.   Anesthesia team to evaluate on the day of surgery. Case is posted for spinal anesthesia.   PROVIDERS: Deland Pretty, MD is PCP - Burney Gauze, MD is HEM-ONC. Last visit 09/28/17. Patient's CLL was diagnosed in [place]. Dr. Marin Olp looked at blood smear and "really do not see anything that looks like CLL." Will continue observation with one year follow-up recommended. - Donmeier, Asencion Partridge, MD is neurologist. Seen for sleep evaluation 06/28/18.    LABS: He is for updated labs on the day of surgery. Cr 0.97 05/18/18 [facility]) and H/H 15.7/43.7 and PLT 134K on 05/23/18 [facility]).   IMAGES: DG left ankle/itbia/fibula 09/10/18: IMPRESSION: Fracture of the distal fibular diaphysis,  proximal to the ankle mortise. No other fracture evident. No evidence of dislocation.  CXR 05/18/18 [facility] Care Everywhere) IMPRESSION: Mild chronic bronchitic type changes with possible superimposed bronchitis but no infiltrates or effusions.  Thyroid US 12/20/17: IMPRESSION: 1. Mild thyromegaly with bilateral nodules. None meets criteria for biopsy. 2. Recommend annual/biennial ultrasound follow-up of inferior left nodule, until stability x5 years confirmed.  CTA head/neck 11/22/17: IMPRESSION: 1. Negative CTA with no evidence for large vessel occlusion. No hemodynamically significant or correctable stenosis identified. 2. Azygos ACA. 3. 1.7 cm right thyroid nodule, indeterminate. Follow-up examination with dedicated thyroid ultrasound recommended for further characterization.   EKG: - EKG 04/14/18 [facility]): Tracing requested. According to Care Everywhere: Sinus tachycardia at 109 BPM Possible Left atrial enlargement Nonspecific chronic TW changes UNCHANGED Confirmed by CHEEK, H. BARRETT ([id number]) on 04/14/2018 4:24:25 PM   - EKG 02/16/18: Normal sinus rhythm Nonspecific ST and T wave abnormality Abnormal ECG Abnormal ekg Confirmed by Carmin Muskrat [phone]) on 02/16/2018 5:31:09 PM   CV: Echo 11/23/17: Study Conclusions - Left ventricle: The cavity size was normal. There was moderate   focal basal hypertrophy of the septum. Systolic function was   vigorous. The estimated ejection fraction was in the range of 65%   to 70%. Doppler parameters are consistent with abnormal left   ventricular relaxation (grade 1 diastolic dysfunction). There was   no evidence of elevated ventricular filling pressure by Doppler   parameters. - Aortic valve: Valve area (VTI): 2.6 cm^2. Valve area (Vmean):   2.44 cm^2. - Mitral valve: There was no regurgitation. - Right ventricle: The cavity size was normal. Wall  thickness was   normal. Systolic function was normal. - Right atrium: The atrium  was normal in size. - Tricuspid valve: There was trivial regurgitation. - Pulmonary arteries: Systolic pressure was within the normal   range. - Pericardium, extracardiac: The pericardium was normal in   appearance. Impressions: - No cardiac source of emboli was indentified. (Comparison 10/31/16 [facility]: LVEF 50-55%, mild hypokinesis of mid inferoseptum and mid inferior walls.)   Cardiac Cath 11/01/16 [facility]; done for abnormal stress test suspicious for apex and inferior wall ischemia):  1. Mild, non-obstructive CAD. - LMCA: mild disease - LAD: Mild luminal irregularities.myocardial bridge of the mid LAD - LCx: Mild luminal irregularities. -  RCA: Mild non-obstructive disease. 2. Moderately elevated LVEDP (25 mmHg). 3.  Recommendation: Medical therapy for CAD.   Past Medical History:  Diagnosis Date  . Cancer ([place])    Leukemia   . CLL (chronic lymphocytic leukemia) ([place])    see records in care everywhere from [place]  . Coronary artery disease    minimal nonobstructive by 10/31/16 cath at [facility] in [place]  . Diabetes mellitus without complication ([place])   . GERD (gastroesophageal reflux disease)   . Hypertension     Past Surgical History:  Procedure Laterality Date  . ANTERIOR CERVICAL DECOMP/DISCECTOMY FUSION N/A 04/20/2017   Procedure: ANTERIOR CERVICAL DECOMPRESSION FUSION, CERVICAL 3-4, CERVICAL 4-5 WITH INSTRUMENTATION AND ALLOGRAFT; REQUEST 3.5 HOURS;  Surgeon: Phylliss Bob, MD;  Location: [place];  Service: Orthopedics;  Laterality: N/A;  ANTERIOR CERVICAL DECOMPRESSION FUSION, CERVICAL 3-4, CERVICAL 4-5 WITH INSTRUMENTATION AND ALLOGRAFT; REQUEST 3.5 HOURS  . BACK SURGERY    . COLONOSCOPY      MEDICATIONS: No current facility-administered medications for this encounter.    Marland Kitchen acetaminophen (TYLENOL) 500 MG tablet  . allopurinol (ZYLOPRIM) 300 MG tablet  . aspirin EC 81 MG tablet  . atenolol (TENORMIN) 25 MG tablet  . atorvastatin (LIPITOR) 40 MG  tablet  . Cinnamon 500 MG TABS  . famotidine (PEPCID) 20 MG tablet  . HYDROcodone-acetaminophen (NORCO/VICODIN) 5-325 MG tablet  . ibuprofen (ADVIL) 800 MG tablet  . ketoconazole (NIZORAL) 2 % cream  . lisinopril-hydrochlorothiazide (PRINZIDE,ZESTORETIC) 20-25 MG tablet  . Multiple Vitamins-Minerals (CENTRUM SILVER 50+MEN PO)  . NON FORMULARY  . potassium chloride (K-DUR) 10 MEQ tablet  . tiZANidine (ZANAFLEX) 4 MG tablet  . CIALIS 20 MG tablet    Myra Gianotti, PA-C Surgical Short Stay/Anesthesiology [facility] Phone [phone] [facility] Phone [phone] 09/12/2018 2:34 PM

## 2018-09-12 NOTE — H&P (Signed)
A pre op hand p   Chief Complaint: Left ankle pain  HPI: Ronald Rice is a 73 y.o. male who presents for evaluation of Left ankle pain with known fracture.He fell and ultimately went to the urgent care where x-rays showed fracture of the lateral malleolus with Nomortise widening But it certainly was a pattern that was very concerning. It has been present for Greater than 48 hours and has been worsening.He presented to my office today where repeat x-rays were taken and showed a little bit of gapping of the medial clear space E and he had significant medial side pain. He has failed conservative measures. Pain is rated as severe.  Past Medical History:  Diagnosis Date  . Cancer ([place])    Leukemia   . CLL (chronic lymphocytic leukemia) ([place])    see records in care everywhere from [place]  . Coronary artery disease    minimal nonobstructive by 10/31/16 cath at [facility] in [place]  . Diabetes mellitus without complication ([place])   . GERD (gastroesophageal reflux disease)   . Hypertension   . Sleep apnea    does not wear CPAP  . Wears dentures   . Wears glasses    Past Surgical History:  Procedure Laterality Date  . ANTERIOR CERVICAL DECOMP/DISCECTOMY FUSION N/A 04/20/2017   Procedure: ANTERIOR CERVICAL DECOMPRESSION FUSION, CERVICAL 3-4, CERVICAL 4-5 WITH INSTRUMENTATION AND ALLOGRAFT; REQUEST 3.5 HOURS;  Surgeon: Phylliss Bob, MD;  Location: [place];  Service: Orthopedics;  Laterality: N/A;  ANTERIOR CERVICAL DECOMPRESSION FUSION, CERVICAL 3-4, CERVICAL 4-5 WITH INSTRUMENTATION AND ALLOGRAFT; REQUEST 3.5 HOURS  . BACK SURGERY    . CARDIAC CATHETERIZATION     in Care Everywhere 11/01/16  . COLONOSCOPY    . MULTIPLE TOOTH EXTRACTIONS     Social History   Socioeconomic History  . Marital status: Single    Spouse name: Not on file  . Number of children: 1  . Years of education: 34  . Highest education level: Not on file  Occupational History  . Occupation: Retired  Scientific laboratory technician  .  Financial resource strain: Not on file  . Food insecurity:    Worry: Not on file    Inability: Not on file  . Transportation needs:    Medical: Not on file    Non-medical: Not on file  Tobacco Use  . Smoking status: Former Smoker    Types: Pipe, Landscape architect, Cigarettes  . Smokeless tobacco: Never Used  Substance and Sexual Activity  . Alcohol use: Yes    Frequency: Never    Comment: occasional  . Drug use: No  . Sexual activity: Not on file  Lifestyle  . Physical activity:    Days per week: Not on file    Minutes per session: Not on file  . Stress: Not on file  Relationships  . Social connections:    Talks on phone: Not on file    Gets together: Not on file    Attends religious service: Not on file    Active member of club or organization: Not on file    Attends meetings of clubs or organizations: Not on file    Relationship status: Not on file  Other Topics Concern  . Not on file  Social History Narrative   Lives with wife   Caffeine use: Drinks coffee daily (2 cups)   Right handed    Family History  Problem Relation Age of Onset  . Heart attack Mother   . Diabetes Sister   . Diabetes  Brother    No Known Allergies Prior to Admission medications   Medication Sig Start Date End Date Taking? Authorizing Provider  acetaminophen (TYLENOL) 500 MG tablet Take 500 mg by mouth every 6 (six) hours as needed (pain).   Yes [provider]  allopurinol (ZYLOPRIM) 300 MG tablet Take 300 mg by mouth daily.   Yes [provider]  aspirin EC 81 MG tablet Take 81 mg by mouth daily.   Yes [provider]  atenolol (TENORMIN) 25 MG tablet Take 25 mg by mouth daily.    Yes [provider]  atorvastatin (LIPITOR) 40 MG tablet Take 40 mg by mouth daily.   Yes [provider]  Cinnamon 500 MG TABS Take 500 mg by mouth daily.   Yes [provider]  famotidine (PEPCID) 20 MG tablet Take 20 mg by mouth at bedtime as needed for heartburn.     Yes [provider]  HYDROcodone-acetaminophen (NORCO/VICODIN) 5-325 MG tablet Take 1-2 tablets by mouth every 6 (six) hours as needed. Patient taking differently: Take 1-2 tablets by mouth every 6 (six) hours as needed for moderate pain.  09/10/18  Yes Volanda Napoleon, PA-C  ibuprofen (ADVIL) 800 MG tablet Take 800 mg by mouth every 8 (eight) hours as needed (pain).   Yes [provider]  ketoconazole (NIZORAL) 2 % cream Apply 1 application topically as needed for irritation. 11/02/17  Yes [provider]  lisinopril-hydrochlorothiazide (PRINZIDE,ZESTORETIC) 20-25 MG tablet Take 2 tablets by mouth daily.    Yes [provider]  Multiple Vitamins-Minerals (CENTRUM SILVER 50+MEN PO) Take 1 tablet by mouth daily.   Yes [provider]  NON FORMULARY Take 2 tablets by mouth daily. Blood Sugar Defense    Yes [provider]  potassium chloride (K-DUR) 10 MEQ tablet Take 10 mEq by mouth daily.    Yes [provider]  tiZANidine (ZANAFLEX) 4 MG tablet Take 4 mg by mouth every 12 (twelve) hours as needed for muscle spasms. 11/02/17  Yes [provider]  CIALIS 20 MG tablet Take 20 mg by mouth as needed for erectile dysfunction. 10/06/17   [provider]     Positive ROS: None  All other systems have been reviewed and were otherwise negative with the exception of those mentioned in the HPI and as above.  Physical Exam: There were no vitals filed for this visit.  General: Alert, no acute distress Cardiovascular: No pedal edema Respiratory: No cyanosis, no use of accessory musculature GI: No organomegaly, abdomen is soft and non-tender Skin: No lesions in the area of chief complaint Neurologic: Sensation intact distally Psychiatric: Patient is competent for consent with normal mood and affect Lymphatic: No axillary or cervical lymphadenopathy  MUSCULOSKELETAL: Left ankle: Dramatic medial side tenderness.  Mark and  soft tissue swelling.  Mild pain through range of motion.  X-ray: X-rays in my office showed mortise widening compared to his x-rays taken at the hospital.  X-rays of the hospital show a Weber C ankle fracture with no significant mortise widening the fibula fracture was shortened and rotated  Assessment/Plan: DISPLACED LEFT DISTAL FIBULA FRACTURE Plan for Procedure(s): OPEN REDUCTION INTERNAL FIXATION (ORIF)LEFT ANKLE FRACTURE  The risks benefits and alternatives were discussed with the patient including but not limited to the risks of nonoperative treatment, versus surgical intervention including infection, bleeding, nerve injury, malunion, nonunion, hardware prominence, hardware failure, need for hardware removal, blood clots, cardiopulmonary complications, morbidity, mortality, among others, and they were willing to proceed.  Predicted outcome is good, although there will be at least a six to nine month expected recovery.  Alta Corning, MD 09/12/2018 4:52 PM

## 2018-09-13 ENCOUNTER — Ambulatory Visit (HOSPITAL_COMMUNITY): Payer: Medicare HMO | Admitting: Vascular Surgery

## 2018-09-13 ENCOUNTER — Encounter (HOSPITAL_COMMUNITY)
Admission: RE | Disposition: A | Discharge status: Home / Self Care | Payer: Self-pay | Source: Home / Self Care | Attending: Orthopedic Surgery

## 2018-09-13 ENCOUNTER — Encounter (HOSPITAL_COMMUNITY): Payer: Self-pay

## 2018-09-13 ENCOUNTER — Ambulatory Visit (HOSPITAL_COMMUNITY)
Admission: RE | Admit: 2018-09-13 | Discharge: 2018-09-13 | Disposition: A | Discharge status: Home / Self Care | Payer: Medicare HMO | Attending: Orthopedic Surgery | Admitting: Orthopedic Surgery

## 2018-09-13 DIAGNOSIS — I10 Essential (primary) hypertension: Secondary | ICD-10-CM | POA: Diagnosis not present

## 2018-09-13 DIAGNOSIS — Z7982 Long term (current) use of aspirin: Secondary | ICD-10-CM | POA: Insufficient documentation

## 2018-09-13 DIAGNOSIS — S93439A Sprain of tibiofibular ligament of unspecified ankle, initial encounter: Secondary | ICD-10-CM | POA: Insufficient documentation

## 2018-09-13 DIAGNOSIS — W010XXA Fall on same level from slipping, tripping and stumbling without subsequent striking against object, initial encounter: Secondary | ICD-10-CM | POA: Insufficient documentation

## 2018-09-13 DIAGNOSIS — E119 Type 2 diabetes mellitus without complications: Secondary | ICD-10-CM | POA: Insufficient documentation

## 2018-09-13 DIAGNOSIS — Z79899 Other long term (current) drug therapy: Secondary | ICD-10-CM | POA: Insufficient documentation

## 2018-09-13 DIAGNOSIS — S8262XA Displaced fracture of lateral malleolus of left fibula, initial encounter for closed fracture: Secondary | ICD-10-CM | POA: Diagnosis present

## 2018-09-13 DIAGNOSIS — D696 Thrombocytopenia, unspecified: Secondary | ICD-10-CM | POA: Diagnosis not present

## 2018-09-13 DIAGNOSIS — I2510 Atherosclerotic heart disease of native coronary artery without angina pectoris: Secondary | ICD-10-CM | POA: Diagnosis not present

## 2018-09-13 DIAGNOSIS — K219 Gastro-esophageal reflux disease without esophagitis: Secondary | ICD-10-CM | POA: Diagnosis not present

## 2018-09-13 DIAGNOSIS — C9110 Chronic lymphocytic leukemia of B-cell type not having achieved remission: Secondary | ICD-10-CM | POA: Insufficient documentation

## 2018-09-13 DIAGNOSIS — G4730 Sleep apnea, unspecified: Secondary | ICD-10-CM | POA: Diagnosis not present

## 2018-09-13 DIAGNOSIS — Z87891 Personal history of nicotine dependence: Secondary | ICD-10-CM | POA: Diagnosis not present

## 2018-09-13 DIAGNOSIS — G8918 Other acute postprocedural pain: Secondary | ICD-10-CM | POA: Diagnosis not present

## 2018-09-13 DIAGNOSIS — S82832A Other fracture of upper and lower end of left fibula, initial encounter for closed fracture: Secondary | ICD-10-CM | POA: Diagnosis not present

## 2018-09-13 HISTORY — DX: Presence of spectacles and contact lenses: Z97.3

## 2018-09-13 HISTORY — DX: Presence of dental prosthetic device (complete) (partial): Z97.2

## 2018-09-13 HISTORY — PX: SYNDESMOSIS REPAIR: SHX5182

## 2018-09-13 HISTORY — DX: Sleep apnea, unspecified: G47.30

## 2018-09-13 HISTORY — PX: ORIF ANKLE FRACTURE: SHX5408

## 2018-09-13 LAB — CBC WITH DIFFERENTIAL/PLATELET
Abs Immature Granulocytes: 0 10*3/uL (ref 0.00–0.07)
Band Neutrophils: 2 %
Basophils Absolute: 0 10*3/uL (ref 0.0–0.1)
Basophils Relative: 0 %
Eosinophils Absolute: 0.2 10*3/uL (ref 0.0–0.5)
Eosinophils Relative: 2 %
HCT: 40.5 % (ref 39.0–52.0)
Hemoglobin: 14.8 g/dL (ref 13.0–17.0)
Lymphocytes Relative: 35 %
Lymphs Abs: 3 10*3/uL (ref 0.7–4.0)
MCH: 32 pg (ref 26.0–34.0)
MCHC: 36.5 g/dL — ABNORMAL HIGH (ref 30.0–36.0)
MCV: 87.5 fL (ref 80.0–100.0)
Monocytes Absolute: 0.8 10*3/uL (ref 0.1–1.0)
Monocytes Relative: 9 %
Neutro Abs: 4.3 10*3/uL (ref 1.7–7.7)
Neutrophils Relative %: 48 %
Other: 4 %
Platelets: 143 10*3/uL — ABNORMAL LOW (ref 150–400)
RBC: 4.63 MIL/uL (ref 4.22–5.81)
RDW: 12.6 % (ref 11.5–15.5)
WBC: 8.5 10*3/uL (ref 4.0–10.5)
nRBC: 0 % (ref 0.0–0.2)

## 2018-09-13 LAB — BASIC METABOLIC PANEL
Anion gap: 10 (ref 5–15)
BUN: 12 mg/dL (ref 8–23)
CO2: 29 mmol/L (ref 22–32)
Calcium: 9.1 mg/dL (ref 8.9–10.3)
Chloride: 101 mmol/L (ref 98–111)
Creatinine, Ser: 1.28 mg/dL — ABNORMAL HIGH (ref 0.61–1.24)
GFR calc Af Amer: >60 mL/min (ref >60)
GFR calc non Af Amer: 56 mL/min — ABNORMAL LOW (ref >60)
Glucose, Bld: 160 mg/dL — ABNORMAL HIGH (ref 70–99)
Potassium: 3.3 mmol/L — ABNORMAL LOW (ref 3.5–5.1)
Sodium: 140 mmol/L (ref 135–145)

## 2018-09-13 LAB — PROTIME-INR
INR: 1 (ref 0.8–1.2)
Prothrombin Time: 12.9 seconds (ref 11.4–15.2)

## 2018-09-13 LAB — GLUCOSE, CAPILLARY
Glucose-Capillary: 163 mg/dL — ABNORMAL HIGH (ref 70–99)
Glucose-Capillary: 151 mg/dL — ABNORMAL HIGH (ref 70–99)

## 2018-09-13 LAB — APTT: aPTT: 26 seconds (ref 24–36)

## 2018-09-13 SURGERY — OPEN REDUCTION INTERNAL FIXATION (ORIF) ANKLE FRACTURE
Anesthesia: General | Laterality: Left

## 2018-09-13 MED ORDER — FENTANYL CITRATE (PF) 100 MCG/2ML IJ SOLN: 25.0000 ug | INTRAMUSCULAR | Status: DC | PRN

## 2018-09-13 MED ORDER — CEFAZOLIN SODIUM-DEXTROSE 1-4 GM/50ML-% IV SOLN
1.0000 g | Freq: Once | INTRAVENOUS | Status: AC
  Administered 2018-09-13: 10:00:00 1 g via INTRAVENOUS

## 2018-09-13 MED ORDER — ASPIRIN EC 325 MG PO TBEC: 325.0000 mg | DELAYED_RELEASE_TABLET | Freq: Every day | ORAL | 0 refills | Status: DC

## 2018-09-13 MED ORDER — PROPOFOL 10 MG/ML IV BOLUS
INTRAVENOUS | Status: AC
  Filled 2018-09-13: qty 20

## 2018-09-13 MED ORDER — CHLORHEXIDINE GLUCONATE 4 % EX LIQD: 60.0000 mL | Freq: Once | CUTANEOUS | Status: DC

## 2018-09-13 MED ORDER — CEFAZOLIN SODIUM-DEXTROSE 2-4 GM/100ML-% IV SOLN
INTRAVENOUS | Status: AC
  Filled 2018-09-13: qty 100

## 2018-09-13 MED ORDER — DEXAMETHASONE SODIUM PHOSPHATE 10 MG/ML IJ SOLN
INTRAMUSCULAR | Status: DC | PRN
  Administered 2018-09-13: 5 mg via INTRAVENOUS

## 2018-09-13 MED ORDER — OXYCODONE HCL 5 MG PO TABS: 5.0000 mg | ORAL_TABLET | Freq: Once | ORAL | Status: DC | PRN

## 2018-09-13 MED ORDER — LIDOCAINE 2% (20 MG/ML) 5 ML SYRINGE
INTRAMUSCULAR | Status: DC | PRN
  Administered 2018-09-13: 60 mg via INTRAVENOUS

## 2018-09-13 MED ORDER — PHENYLEPHRINE HCL (PRESSORS) 10 MG/ML IV SOLN
INTRAVENOUS | Status: DC | PRN
  Administered 2018-09-13 (×2): 120 ug via INTRAVENOUS

## 2018-09-13 MED ORDER — CEFAZOLIN SODIUM-DEXTROSE 1-4 GM/50ML-% IV SOLN
INTRAVENOUS | Status: AC
  Filled 2018-09-13: qty 50

## 2018-09-13 MED ORDER — DEXTROSE 5 % IV SOLN
3.0000 g | INTRAVENOUS | Status: DC
  Filled 2018-09-13: qty 3000

## 2018-09-13 MED ORDER — FENTANYL CITRATE (PF) 100 MCG/2ML IJ SOLN
INTRAMUSCULAR | Status: DC | PRN
  Administered 2018-09-13 (×3): 50 ug via INTRAVENOUS

## 2018-09-13 MED ORDER — LACTATED RINGERS IV SOLN
INTRAVENOUS | Status: DC | PRN
  Administered 2018-09-13 (×2): via INTRAVENOUS

## 2018-09-13 MED ORDER — FENTANYL CITRATE (PF) 250 MCG/5ML IJ SOLN
INTRAMUSCULAR | Status: AC
  Filled 2018-09-13: qty 5

## 2018-09-13 MED ORDER — BUPIVACAINE-EPINEPHRINE (PF) 0.5% -1:200000 IJ SOLN
INTRAMUSCULAR | Status: DC | PRN
  Administered 2018-09-13: 30 mL via PERINEURAL

## 2018-09-13 MED ORDER — SODIUM CHLORIDE 0.9 % IV SOLN
INTRAVENOUS | Status: DC | PRN
  Administered 2018-09-13: 08:00:00 50 ug/min via INTRAVENOUS

## 2018-09-13 MED ORDER — SUCCINYLCHOLINE CHLORIDE 20 MG/ML IJ SOLN
INTRAMUSCULAR | Status: DC | PRN
  Administered 2018-09-13: 140 mg via INTRAVENOUS

## 2018-09-13 MED ORDER — ONDANSETRON HCL 4 MG/2ML IJ SOLN: 4.0000 mg | Freq: Four times a day (QID) | INTRAMUSCULAR | Status: DC | PRN

## 2018-09-13 MED ORDER — DEXTROSE 5 % IV SOLN
INTRAVENOUS | Status: DC | PRN
  Administered 2018-09-13: 08:00:00 3 g via INTRAVENOUS

## 2018-09-13 MED ORDER — POVIDONE-IODINE 10 % EX SWAB: 2.0000 "application " | Freq: Once | CUTANEOUS | Status: DC

## 2018-09-13 MED ORDER — PROPOFOL 10 MG/ML IV BOLUS
INTRAVENOUS | Status: DC | PRN
  Administered 2018-09-13: 200 mg via INTRAVENOUS

## 2018-09-13 MED ORDER — HYDROCODONE-ACETAMINOPHEN 5-325 MG PO TABS: 1.0000 | ORAL_TABLET | Freq: Four times a day (QID) | ORAL | 0 refills | Status: DC | PRN

## 2018-09-13 MED ORDER — OXYCODONE HCL 5 MG/5ML PO SOLN: 5.0000 mg | Freq: Once | ORAL | Status: DC | PRN

## 2018-09-13 MED ORDER — ONDANSETRON HCL 4 MG/2ML IJ SOLN
INTRAMUSCULAR | Status: DC | PRN
  Administered 2018-09-13: 4 mg via INTRAVENOUS

## 2018-09-13 MED ORDER — 0.9 % SODIUM CHLORIDE (POUR BTL) OPTIME
TOPICAL | Status: DC | PRN
  Administered 2018-09-13: 08:00:00 1000 mL

## 2018-09-13 MED ORDER — HYDROCODONE-ACETAMINOPHEN 5-325 MG PO TABS: 1.0000 | ORAL_TABLET | Freq: Four times a day (QID) | ORAL | Status: DC | PRN

## 2018-09-13 MED ORDER — BUPIVACAINE HCL (PF) 0.25 % IJ SOLN
INTRAMUSCULAR | Status: AC
  Filled 2018-09-13: qty 30

## 2018-09-13 SURGICAL SUPPLY — 66 items
ANKLE SYNDEMOSIS ZIPTIGHT (Ankle) ×2 IMPLANT
BANDAGE ACE 6X5 VEL STRL LF (GAUZE/BANDAGES/DRESSINGS) ×2 IMPLANT
BANDAGE ELASTIC 4 VELCRO ST LF (GAUZE/BANDAGES/DRESSINGS) ×2 IMPLANT
BANDAGE ELASTIC 6 VELCRO ST LF (GAUZE/BANDAGES/DRESSINGS) ×2 IMPLANT
BANDAGE ESMARK 6X9 LF (GAUZE/BANDAGES/DRESSINGS) ×1 IMPLANT
BIT DRILL 110X2.5XQCK CNCT (BIT) ×1 IMPLANT
BIT DRILL 2.5 (BIT) ×1
BIT DRILL QC 110 3.5 (BIT) ×1
BIT DRILL QC 110 3.5MM (BIT) ×1 IMPLANT
BIT DRL 110X2.5XQCK CNCT (BIT) ×1
BLADE CLIPPER SURG (BLADE) IMPLANT
BNDG ESMARK 6X9 LF (GAUZE/BANDAGES/DRESSINGS) ×2
COVER MAYO STAND STRL (DRAPES) ×2 IMPLANT
COVER SURGICAL LIGHT HANDLE (MISCELLANEOUS) ×2 IMPLANT
COVER WAND RF STERILE (DRAPES) IMPLANT
CUFF TOURN SGL QUICK 24 (TOURNIQUET CUFF) ×1
CUFF TOURNIQUET SINGLE 34IN LL (TOURNIQUET CUFF) IMPLANT
CUFF TOURNIQUET SINGLE 44IN (TOURNIQUET CUFF) IMPLANT
CUFF TRNQT CYL 24X4X16.5-23 (TOURNIQUET CUFF) ×1 IMPLANT
DRAPE OEC MINIVIEW 54X84 (DRAPES) IMPLANT
DRAPE U-SHAPE 47X51 STRL (DRAPES) ×2 IMPLANT
DRILL BIT QC 110 3.5MM (BIT) ×1
DURAPREP 26ML APPLICATOR (WOUND CARE) ×2 IMPLANT
ELECT REM PT RETURN 9FT ADLT (ELECTROSURGICAL) ×2
ELECTRODE REM PT RTRN 9FT ADLT (ELECTROSURGICAL) ×1 IMPLANT
GAUZE SPONGE 4X4 12PLY STRL (GAUZE/BANDAGES/DRESSINGS) ×2 IMPLANT
GAUZE XEROFORM 1X8 LF (GAUZE/BANDAGES/DRESSINGS) IMPLANT
GAUZE XEROFORM 5X9 LF (GAUZE/BANDAGES/DRESSINGS) ×2 IMPLANT
GLOVE BIOGEL PI IND STRL 8 (GLOVE) ×1 IMPLANT
GLOVE BIOGEL PI INDICATOR 8 (GLOVE) ×1
GLOVE ECLIPSE 7.5 STRL STRAW (GLOVE) ×2 IMPLANT
GOWN STRL REUS W/ TWL LRG LVL3 (GOWN DISPOSABLE) ×2 IMPLANT
GOWN STRL REUS W/ TWL XL LVL3 (GOWN DISPOSABLE) ×2 IMPLANT
GOWN STRL REUS W/TWL LRG LVL3 (GOWN DISPOSABLE) ×2
GOWN STRL REUS W/TWL XL LVL3 (GOWN DISPOSABLE) ×2
KIT BASIN OR (CUSTOM PROCEDURE TRAY) ×2 IMPLANT
KIT TURNOVER KIT B (KITS) ×2 IMPLANT
MANIFOLD NEPTUNE II (INSTRUMENTS) IMPLANT
NS IRRIG 1000ML POUR BTL (IV SOLUTION) ×2 IMPLANT
PACK ORTHO EXTREMITY (CUSTOM PROCEDURE TRAY) ×2 IMPLANT
PAD ARMBOARD 7.5X6 YLW CONV (MISCELLANEOUS) ×4 IMPLANT
PAD CAST 4YDX4 CTTN HI CHSV (CAST SUPPLIES) ×1 IMPLANT
PADDING CAST COTTON 4X4 STRL (CAST SUPPLIES) ×1
PLATE TUBULAR 9 HOLE 1/3 (Plate) ×2 IMPLANT
SCREW CANC 2.5XFT 16X4XST SM (Screw) ×1 IMPLANT
SCREW CANC 4.0X16 (Screw) ×1 IMPLANT
SCREW CORTICAL 3.5 16MM (Screw) ×6 IMPLANT
SCREW CORTICAL 3.5X12 (Screw) ×2 IMPLANT
SCREW CORTICAL 3.5X14 (Screw) ×6 IMPLANT
SPLINT FIBERGLASS 4X30 (CAST SUPPLIES) ×2 IMPLANT
SPONGE LAP 4X18 RFD (DISPOSABLE) ×2 IMPLANT
STAPLER VISISTAT 35W (STAPLE) ×2 IMPLANT
SUCTION FRAZIER HANDLE 10FR (MISCELLANEOUS) ×1
SUCTION FRAZIER TIP 10 FR DISP (SUCTIONS) ×2 IMPLANT
SUCTION TUBE FRAZIER 10FR DISP (MISCELLANEOUS) ×1 IMPLANT
SUT ETHILON 4 0 PS 2 18 (SUTURE) IMPLANT
SUT VIC AB 0 CT1 27 (SUTURE) ×1
SUT VIC AB 0 CT1 27XBRD ANBCTR (SUTURE) ×1 IMPLANT
SUT VIC AB 0 CTB1 27 (SUTURE) IMPLANT
SUT VIC AB 2-0 FS1 27 (SUTURE) ×4 IMPLANT
SYR CONTROL 10ML LL (SYRINGE) IMPLANT
SYSTEM FIXATN ANKL SYNDESMOSIS (Ankle) ×1 IMPLANT
TOWEL OR 17X24 6PK STRL BLUE (TOWEL DISPOSABLE) ×2 IMPLANT
TOWEL OR 17X26 10 PK STRL BLUE (TOWEL DISPOSABLE) ×2 IMPLANT
TUBE CONNECTING 12X1/4 (SUCTIONS) ×2 IMPLANT
WATER STERILE IRR 1000ML POUR (IV SOLUTION) ×2 IMPLANT

## 2018-09-13 NOTE — Anesthesia Postprocedure Evaluation (Signed)
Anesthesia Post Note  Patient: Odessa Morren  Procedure(s) Performed: OPEN REDUCTION INTERNAL FIXATION (ORIF) LATERAL LEFT ANKLE FRACTURE (Left ) OPEN REDUCTION INTERNAL FIXATION SYNDESMOSIS REPAIR LEFT ANKLE (Left )     Patient location during evaluation: PACU Anesthesia Type: General and Regional Level of consciousness: awake and alert Pain management: pain level controlled Vital Signs Assessment: post-procedure vital signs reviewed and stable Respiratory status: spontaneous breathing, nonlabored ventilation, respiratory function stable and patient connected to nasal cannula oxygen Cardiovascular status: blood pressure returned to baseline and stable Postop Assessment: no apparent nausea or vomiting Anesthetic complications: no    Last Vitals:  Vitals:   09/13/18 0955 09/13/18 1000  BP: (!) 139/91   Pulse: 89 85  Resp: 17 16  Temp:  36.5 C  SpO2: 93% 94%    Last Pain:  Vitals:   09/13/18 1000  TempSrc:   PainSc: 2                  Ambry Dix S

## 2018-09-13 NOTE — Anesthesia Procedure Notes (Signed)
Anesthesia Regional Block: Popliteal block   Pre-Anesthetic Checklist: ,, timeout performed, Correct Patient, Correct Site, Correct Laterality, Correct Procedure, Correct Position, site marked, Risks and benefits discussed,  Surgical consent,  Pre-op evaluation,  At surgeon's request and post-op pain management  Laterality: Left  Prep: chloraprep       Needles:  Injection technique: Single-shot  Needle Type: Echogenic Stimulator Needle          Additional Needles:   Procedures:, nerve stimulator,,,,,,,   Nerve Stimulator or Paresthesia:  Response: plantar flexion of foot, 0.45 mA,   Additional Responses:   Narrative:  Start time: 09/13/2018 7:01 AM End time: 09/13/2018 7:09 AM Injection made incrementally with aspirations every 5 mL.  Performed by: Personally  Anesthesiologist: Albertha Ghee, MD  Additional Notes: Functioning IV was confirmed and monitors were applied.  A 68mm 21ga Arrow echogenic stimulator needle was used. Sterile prep and drape,hand hygiene and sterile gloves were used.  Negative aspiration and negative test dose prior to incremental administration of local anesthetic. The patient tolerated the procedure well.  Ultrasound guidance: relevent anatomy identified, needle position confirmed, local anesthetic spread visualized around nerve(s), vascular puncture avoided.  Image printed for medical record.

## 2018-09-13 NOTE — Interval H&P Note (Signed)
History and Physical Interval Note:  09/13/2018 7:24 AM  Ronald Rice  has presented today for surgery, with the diagnosis of DISPLACED LEFT DISTAL FIBULA FRACTURE.  The various methods of treatment have been discussed with the patient and family. After consideration of risks, benefits and other options for treatment, the patient has consented to  Procedure(s): OPEN REDUCTION INTERNAL FIXATION (ORIF)LEFT ANKLE FRACTURE (Left) as a surgical intervention.  The patient's history has been reviewed, patient examined, no change in status, stable for surgery.  I have reviewed the patient's chart and labs.  Questions were answered to the patient's satisfaction.     Alta Corning

## 2018-09-13 NOTE — Brief Op Note (Signed)
09/13/2018  8:52 AM  PATIENT:  Ronald Rice  73 y.o. male  PRE-OPERATIVE DIAGNOSIS:  DISPLACED LEFT DISTAL FIBULA FRACTURE  POST-OPERATIVE DIAGNOSIS:  DISPLACED LEFT DISTAL FIBULA FRACTURE  PROCEDURE:  Procedure(s): OPEN REDUCTION INTERNAL FIXATION (ORIF) LATERAL LEFT ANKLE FRACTURE (Left) Syndesmosis Repair  SURGEON:  Surgeon(s) and Role:    Dorna Leitz, MD - Primary  PHYSICIAN ASSISTANT:   ASSISTANTS: jim bethune   ANESTHESIA:   general  EBL:  minimal   BLOOD ADMINISTERED:none  DRAINS: none   LOCAL MEDICATIONS USED:  MARCAINE     SPECIMEN:  No Specimen  DISPOSITION OF SPECIMEN:  N/A  COUNTS:  YES  TOURNIQUET:  * Missing tourniquet times found for documented tourniquets in log: [id number] *  DICTATION: .Other Dictation: Dictation Number [id number]  PLAN OF CARE: Discharge to home after PACU  PATIENT DISPOSITION:  PACU - hemodynamically stable.   Delay start of Pharmacological VTE agent (>24hrs) due to surgical blood loss or risk of bleeding: no

## 2018-09-13 NOTE — Anesthesia Procedure Notes (Signed)
Procedure Name: Intubation Date/Time: 09/13/2018 7:37 AM Performed by: Neldon Newport, CRNA Pre-anesthesia Checklist: Timeout performed, Patient being monitored, Suction available, Emergency Drugs available and Patient identified Patient Re-evaluated:Patient Re-evaluated prior to induction Oxygen Delivery Method: Circle system utilized Preoxygenation: Pre-oxygenation with 100% oxygen Induction Type: IV induction and Rapid sequence Laryngoscope Size: Mac and 4 Grade View: Grade I Tube type: Oral Tube size: 7.5 mm Number of attempts: 1 Placement Confirmation: breath sounds checked- equal and bilateral,  positive ETCO2 and ETT inserted through vocal cords under direct vision Secured at: 23 cm Tube secured with: Tape Dental Injury: Teeth and Oropharynx as per pre-operative assessment

## 2018-09-13 NOTE — Discharge Instructions (Signed)
Elevate your left leg on 3 pillows.  Try to elevate it above your heart as much as possible. You may wiggle your toes as tolerated. I have given you a prescription for aspirin 325 mg once daily.  I would like you to take this x1 month so that you do not get a blood clot. He can ambulate touchdown weightbearing on the left leg with crutches or a walker.

## 2018-09-13 NOTE — Op Note (Signed)
NAMEKITT, LEDET MEDICAL RECORD NL:[id number] ACCOUNT [id number] DATE OF BIRTH:1946-04-24 FACILITY: [facility] LOCATION: [facility] PHYSICIAN:Styles Fambro L. Jarelyn Bambach, MD  OPERATIVE REPORT  DATE OF PROCEDURE:  09/13/2018  PREOPERATIVE DIAGNOSIS:  Weber C ankle fracture, left with concern for syndesmotic disruption.  POSTOPERATIVE DIAGNOSIS:  Weber C ankle fracture, left with syndesmotic disruption.  PROCEDURE:   1.  Open reduction internal fixation of lateral malleolus fracture. 2.  Open reduction internal fixation of syndesmosis, left ankle. 3.  Interpretation of multiple intraoperative fluoroscopic images.  SURGEON:  Dorna Leitz, MD  ASSISTANT:  Gaspar Skeeters PA-C, who was present for the entire case and assisted by manipulation of the leg, retraction, and closing to minimize OR time.  BRIEF HISTORY:  The patient is a 73 year old male, otherwise healthy, who slipped and fell.  He suffered a Weber C ankle fracture.  He was seen in the emergency room, x-rays were taken and he was sent to OR office for followup.  X-rays at followup showed  a little bit of widening of the mortise compared to his original film and I was concerned because of the fracture pattern that there may be some mortise disruption.  He was brought to the operating room for fixation.    We had a long discussion with the  patient preoperatively and talked about the possibility of observing him for another week.  Given the current concerns, he felt that he did not want to have any additional observation and wanted to go for surgical intervention and I thought that was not  inappropriate just based on the overall situation.  He was brought to the operating room for this procedure.  DESCRIPTION OF PROCEDURE:  The patient was brought to the operating room after adequate anesthesia was obtained with a general anesthetic and popliteal block.  The patient was placed supine on the operating table.  Left leg was then prepped and draped in  the  usual sterile fashion.  Following this, the leg was exsanguinated, blood pressure tourniquet inflated to 250 mmHg.  Following this, a lateral incision was made in subcutaneous 6 atenolol of the fibula.  Fibula was identified.  The fracture was  identified, cleansed of healing elements.  It appeared that the syndesmosis was disrupted all around the area of the fracture.  Down towards the ankle, it was a little bit better but it appeared when I irrigated the fracture,  got it out , closed it,  held it in anatomic position and then stressed the syndesmosis and manually, you could feel that there was something moving visually did appear to gap slightly.  It was not dramatic, but definitely gapped.  At that point, I felt like we needed a  syndesmotic repair.  At this point, we got a clamp on the fracture and with the fracture clamp, we were able to get an interfragmentary screw across to just hold it in place, then used a 9-hole one-third tubular got 4 above and 3 below and then used the  second to the bottom hole for the syndesmotic repair.  At this point, a kit was open for syndesmotic repair and the guidewire was advanced 4 cortices.  Then the cannulated drill was then advanced 4 cortices.  We then put the TightRope across and then  locked it in place and then tensioned it down.  Once it was completely tensioned down, we then put the foot in maximal dorsiflexion to tension it and that established a nice syndesmosis.  At this point, I stressed the syndesmosis.  There was no gapping  open.  It actually felt better as well manually.  At this point, we tied the wound over for the syndesmotic repair and removed all stitches.  The wound was irrigated, suctioned dry, closed in layers.  Sterile compressive dressing was applied after the  skin was closed with staples and the patient was taken to recovery room in a posterior splint where he was noted to be in satisfactory condition.    Estimated blood loss for  procedure was minimal.    Tourniquet time was approximately an hour, but the final will be gotten from the anesthetic record.  AN/NUANCE  D:09/13/2018 T:09/13/2018 JOB:[id number]

## 2018-09-13 NOTE — Progress Notes (Signed)
Unable to collect UA prior to surgery.  Patient unable to void.

## 2018-09-13 NOTE — Transfer of Care (Signed)
Immediate Anesthesia Transfer of Care Note  Patient: Ronald Rice  Procedure(s) Performed: OPEN REDUCTION INTERNAL FIXATION (ORIF) LATERAL LEFT ANKLE FRACTURE (Left ) OPEN REDUCTION INTERNAL FIXATION SYNDESMOSIS REPAIR LEFT ANKLE (Left )  Patient Location: PACU  Anesthesia Type:General  Level of Consciousness: awake, alert  and oriented  Airway & Oxygen Therapy: Patient Spontanous Breathing and Patient connected to face mask oxygen  Post-op Assessment: Report given to RN, Post -op Vital signs reviewed and stable and Patient moving all extremities X 4  Post vital signs: Reviewed and stable  Last Vitals:  Vitals Value Taken Time  BP 150/96 09/13/2018  9:23 AM  Temp    Pulse 92 09/13/2018  9:24 AM  Resp 16 09/13/2018  9:24 AM  SpO2 99 % 09/13/2018  9:24 AM  Vitals shown include unvalidated device data.  Last Pain:  Vitals:   09/13/18 0626  TempSrc: Oral      Patients Stated Pain Goal: 5 (29/56/21 3086)  Complications: No apparent anesthesia complications

## 2018-09-14 ENCOUNTER — Encounter (HOSPITAL_COMMUNITY): Payer: Self-pay | Admitting: Orthopedic Surgery

## 2018-09-14 LAB — PATHOLOGIST SMEAR REVIEW

## 2018-09-20 DIAGNOSIS — N529 Male erectile dysfunction, unspecified: Secondary | ICD-10-CM | POA: Diagnosis not present

## 2018-09-21 DIAGNOSIS — S8262XA Displaced fracture of lateral malleolus of left fibula, initial encounter for closed fracture: Secondary | ICD-10-CM | POA: Diagnosis not present

## 2018-09-26 DIAGNOSIS — S8262XA Displaced fracture of lateral malleolus of left fibula, initial encounter for closed fracture: Secondary | ICD-10-CM | POA: Diagnosis not present

## 2018-09-29 ENCOUNTER — Inpatient Hospital Stay: Payer: Medicare HMO | Admitting: Hematology & Oncology

## 2018-09-29 ENCOUNTER — Inpatient Hospital Stay: Payer: Medicare HMO | Attending: Hematology & Oncology

## 2018-10-05 DIAGNOSIS — G4733 Obstructive sleep apnea (adult) (pediatric): Secondary | ICD-10-CM | POA: Diagnosis not present

## 2018-10-09 DIAGNOSIS — G4733 Obstructive sleep apnea (adult) (pediatric): Secondary | ICD-10-CM | POA: Diagnosis not present

## 2018-10-17 DIAGNOSIS — M67911 Unspecified disorder of synovium and tendon, right shoulder: Secondary | ICD-10-CM | POA: Diagnosis not present

## 2018-10-17 DIAGNOSIS — S8262XD Displaced fracture of lateral malleolus of left fibula, subsequent encounter for closed fracture with routine healing: Secondary | ICD-10-CM | POA: Diagnosis not present

## 2018-10-19 DIAGNOSIS — E785 Hyperlipidemia, unspecified: Secondary | ICD-10-CM | POA: Diagnosis not present

## 2018-10-19 DIAGNOSIS — M79606 Pain in leg, unspecified: Secondary | ICD-10-CM | POA: Diagnosis not present

## 2018-10-19 DIAGNOSIS — E119 Type 2 diabetes mellitus without complications: Secondary | ICD-10-CM | POA: Diagnosis not present

## 2018-10-19 DIAGNOSIS — I10 Essential (primary) hypertension: Secondary | ICD-10-CM | POA: Diagnosis not present

## 2018-10-24 ENCOUNTER — Other Ambulatory Visit: Payer: Self-pay

## 2018-10-24 ENCOUNTER — Emergency Department (HOSPITAL_BASED_OUTPATIENT_CLINIC_OR_DEPARTMENT_OTHER)
Admission: EM | Admit: 2018-10-24 | Discharge: 2018-10-25 | Disposition: A | Discharge status: Home / Self Care | Payer: Medicare HMO | Source: Home / Self Care | Attending: Emergency Medicine | Admitting: Emergency Medicine

## 2018-10-24 DIAGNOSIS — Z856 Personal history of leukemia: Secondary | ICD-10-CM | POA: Insufficient documentation

## 2018-10-24 DIAGNOSIS — Z833 Family history of diabetes mellitus: Secondary | ICD-10-CM | POA: Diagnosis not present

## 2018-10-24 DIAGNOSIS — A419 Sepsis, unspecified organism: Secondary | ICD-10-CM | POA: Diagnosis not present

## 2018-10-24 DIAGNOSIS — R531 Weakness: Secondary | ICD-10-CM | POA: Diagnosis not present

## 2018-10-24 DIAGNOSIS — I10 Essential (primary) hypertension: Secondary | ICD-10-CM | POA: Diagnosis not present

## 2018-10-24 DIAGNOSIS — Z87891 Personal history of nicotine dependence: Secondary | ICD-10-CM | POA: Diagnosis not present

## 2018-10-24 DIAGNOSIS — C9110 Chronic lymphocytic leukemia of B-cell type not having achieved remission: Secondary | ICD-10-CM | POA: Diagnosis present

## 2018-10-24 DIAGNOSIS — G4730 Sleep apnea, unspecified: Secondary | ICD-10-CM | POA: Diagnosis present

## 2018-10-24 DIAGNOSIS — E119 Type 2 diabetes mellitus without complications: Secondary | ICD-10-CM | POA: Diagnosis present

## 2018-10-24 DIAGNOSIS — I2510 Atherosclerotic heart disease of native coronary artery without angina pectoris: Secondary | ICD-10-CM | POA: Diagnosis present

## 2018-10-24 DIAGNOSIS — R000 Tachycardia, unspecified: Secondary | ICD-10-CM | POA: Diagnosis not present

## 2018-10-24 DIAGNOSIS — R6520 Severe sepsis without septic shock: Secondary | ICD-10-CM | POA: Diagnosis not present

## 2018-10-24 DIAGNOSIS — R509 Fever, unspecified: Secondary | ICD-10-CM | POA: Diagnosis not present

## 2018-10-24 DIAGNOSIS — Z8572 Personal history of non-Hodgkin lymphomas: Secondary | ICD-10-CM | POA: Diagnosis not present

## 2018-10-24 DIAGNOSIS — W19XXXA Unspecified fall, initial encounter: Secondary | ICD-10-CM | POA: Diagnosis present

## 2018-10-24 DIAGNOSIS — J392 Other diseases of pharynx: Secondary | ICD-10-CM | POA: Diagnosis not present

## 2018-10-24 DIAGNOSIS — C8100 Nodular lymphocyte predominant Hodgkin lymphoma, unspecified site: Secondary | ICD-10-CM | POA: Diagnosis not present

## 2018-10-24 DIAGNOSIS — Z981 Arthrodesis status: Secondary | ICD-10-CM | POA: Diagnosis not present

## 2018-10-24 DIAGNOSIS — Z20828 Contact with and (suspected) exposure to other viral communicable diseases: Secondary | ICD-10-CM | POA: Diagnosis present

## 2018-10-24 DIAGNOSIS — Z79891 Long term (current) use of opiate analgesic: Secondary | ICD-10-CM | POA: Diagnosis not present

## 2018-10-24 DIAGNOSIS — Z791 Long term (current) use of non-steroidal anti-inflammatories (NSAID): Secondary | ICD-10-CM | POA: Diagnosis not present

## 2018-10-24 DIAGNOSIS — M109 Gout, unspecified: Secondary | ICD-10-CM | POA: Diagnosis present

## 2018-10-24 DIAGNOSIS — M549 Dorsalgia, unspecified: Secondary | ICD-10-CM | POA: Diagnosis not present

## 2018-10-24 DIAGNOSIS — M7989 Other specified soft tissue disorders: Secondary | ICD-10-CM | POA: Diagnosis not present

## 2018-10-24 DIAGNOSIS — L03116 Cellulitis of left lower limb: Secondary | ICD-10-CM | POA: Diagnosis not present

## 2018-10-24 DIAGNOSIS — C8511 Unspecified B-cell lymphoma, lymph nodes of head, face, and neck: Secondary | ICD-10-CM | POA: Diagnosis not present

## 2018-10-24 DIAGNOSIS — K219 Gastro-esophageal reflux disease without esophagitis: Secondary | ICD-10-CM | POA: Diagnosis present

## 2018-10-24 DIAGNOSIS — R221 Localized swelling, mass and lump, neck: Secondary | ICD-10-CM

## 2018-10-24 DIAGNOSIS — R590 Localized enlarged lymph nodes: Secondary | ICD-10-CM | POA: Diagnosis not present

## 2018-10-24 DIAGNOSIS — Z7982 Long term (current) use of aspirin: Secondary | ICD-10-CM | POA: Diagnosis not present

## 2018-10-24 DIAGNOSIS — Z8249 Family history of ischemic heart disease and other diseases of the circulatory system: Secondary | ICD-10-CM | POA: Diagnosis not present

## 2018-10-24 DIAGNOSIS — E785 Hyperlipidemia, unspecified: Secondary | ICD-10-CM | POA: Diagnosis not present

## 2018-10-24 DIAGNOSIS — R609 Edema, unspecified: Secondary | ICD-10-CM | POA: Diagnosis not present

## 2018-10-24 DIAGNOSIS — Z79899 Other long term (current) drug therapy: Secondary | ICD-10-CM | POA: Diagnosis not present

## 2018-10-24 NOTE — ED Triage Notes (Signed)
Pt states he noticed swelling in the left side of his neck about an hour ago  Pt denies any difficulty breathing or swallowing  Pt denies pain

## 2018-10-25 ENCOUNTER — Encounter (HOSPITAL_BASED_OUTPATIENT_CLINIC_OR_DEPARTMENT_OTHER): Payer: Self-pay | Admitting: Emergency Medicine

## 2018-10-25 NOTE — ED Provider Notes (Signed)
Emergency Department Provider Note   I have reviewed the triage vital signs and the nursing notes.   HISTORY  Chief Complaint neck swelling   HPI Ronald Rice is a 73 y.o. male with multiple medical problems documented below to include leukemia, coronary artery disease, diabetes, hypertension who presents to the emergency department today with a acute swelling in his left neck.  Patient states that yesterday his neck was normal this morning seemed a little bit full but then this afternoon he acutely got swollen on the left side.  He denies any trouble breathing or swallowing.  No fevers or pain.  Does not member I think this happened before.  It is progressively improved since this started however he is concerned that it could be a stroke so presents here for further evaluation.  Patient without any chest pain, rash or recent upper respiratory infections.   No other associated or modifying symptoms.    Past Medical History:  Diagnosis Date   Cancer ([place])    Leukemia    CLL (chronic lymphocytic leukemia) ([place])    see records in care everywhere from [facility]   Coronary artery disease    minimal nonobstructive by 10/31/16 cath at [facility] in [place]   Diabetes mellitus without complication ([place])    GERD (gastroesophageal reflux disease)    Hypertension    Sleep apnea    does not wear CPAP   Wears dentures    Wears glasses     Patient Active Problem List   Diagnosis Date Noted   Displaced fracture of lateral malleolus of left fibula 09/13/2018   Dream enactment behavior 07/26/2018   Snoring 07/26/2018   Hypersomnia with sleep apnea 07/26/2018   Fever    Acute cystitis with hematuria    Ischemic stroke ([place]) 11/22/2017   Right hip pain 07/18/2017   Cerebellar ataxia in diseases classified elsewhere ([place]) 07/18/2017   Radiculopathy 04/20/2017   Chronic lymphoid leukemia ([place]) 01/25/2017   CAD (coronary artery disease) 10/31/2016   Influenza A  05/24/2016   Acute bronchitis 05/22/2016   Chronic leukemia ([place]) 05/22/2016   Weakness 05/22/2016   Sepsis ([place]) 05/22/2016   Pedal edema 05/22/2016   Gait abnormality 07/28/2015   Nonspecific elevation of levels of transaminase and lactic acid dehydrogenase (LDH) 03/02/2015   Pseudarthrosis after fusion or arthrodesis 06/03/2014   Fusion of spine of cervical region 06/05/2013   Fusion of lumbar spine 03/07/2012   Benign prostatic hyperplasia 06/15/2011   Liver cyst 07/29/2009   Kidney cyst, acquired 06/17/2009   Type 2 diabetes mellitus without complications ([place]) 48/18/5631   Diverticulosis of colon 08/03/2007   Hyperlipidemia 03/14/2007   Neuropathy, peripheral 12/03/2005   Allergic rhinitis 03/01/2000   Essential (primary) hypertension 10/03/1996   GERD (gastroesophageal reflux disease) 07/02/1995    Past Surgical History:  Procedure Laterality Date   ANTERIOR CERVICAL DECOMP/DISCECTOMY FUSION N/A 04/20/2017   Procedure: ANTERIOR CERVICAL DECOMPRESSION FUSION, CERVICAL 3-4, CERVICAL 4-5 WITH INSTRUMENTATION AND ALLOGRAFT; REQUEST 3.5 HOURS;  Surgeon: Phylliss Bob, MD;  Location: [place];  Service: Orthopedics;  Laterality: N/A;  ANTERIOR CERVICAL DECOMPRESSION FUSION, CERVICAL 3-4, CERVICAL 4-5 WITH INSTRUMENTATION AND ALLOGRAFT; REQUEST 3.5 HOURS   BACK SURGERY     CARDIAC CATHETERIZATION     in Care Everywhere 11/01/16   COLONOSCOPY     MULTIPLE TOOTH EXTRACTIONS     ORIF ANKLE FRACTURE Left 09/13/2018   Procedure: OPEN REDUCTION INTERNAL FIXATION (ORIF) LATERAL LEFT ANKLE FRACTURE;  Surgeon: Dorna Leitz, MD;  Location[facility]  OR;  Service: Orthopedics;  Laterality: Left;   SYNDESMOSIS REPAIR Left 09/13/2018   Procedure: OPEN REDUCTION INTERNAL FIXATION SYNDESMOSIS REPAIR LEFT ANKLE;  Surgeon: Dorna Leitz, MD;  Location: [place];  Service: Orthopedics;  Laterality: Left;    Current Outpatient Rx   Order #: [id number] Class: Historical Med   Order #:  [id number] Class: Historical Med   Order #: [id number] Class: Normal   Order #: [id number] Class: Historical Med   Order #: [id number] Class: Historical Med   Order #: [id number] Class: Historical Med   Order #: [id number] Class: Historical Med   Order #: [id number] Class: Historical Med   Order #: [id number] Class: Normal   Order #: [id number] Class: Historical Med   Order #: [id number] Class: Historical Med   Order #: [id number] Class: Historical Med   Order #: [id number] Class: Historical Med   Order #: [id number] Class: Historical Med   Order #: [id number] Class: Historical Med   Order #: [id number] Class: Historical Med    Allergies Patient has no known allergies.  Family History  Problem Relation Age of Onset   Heart attack Mother    Diabetes Sister    Diabetes Brother     Social History Social History   Tobacco Use   Smoking status: Former Smoker    Types: Pipe, Landscape architect, Cigarettes   Smokeless tobacco: Never Used  Substance Use Topics   Alcohol use: Yes    Frequency: Never    Comment: occasional   Drug use: No    Review of Systems  All other systems negative except as documented in the HPI. All pertinent positives and negatives as reviewed in the HPI. ____________________________________________   PHYSICAL EXAM:  VITAL SIGNS: ED Triage Vitals  Enc Vitals Group     BP 10/24/18 2349 (!) 148/86     Pulse Rate 10/24/18 2349 96     Resp 10/24/18 2349 20     Temp 10/24/18 2349 98.4 F (36.9 C)     Temp Source 10/24/18 2349 Oral     SpO2 10/24/18 2349 98 %     Weight 10/24/18 2354 280 lb (127 kg)     Height 10/24/18 2354 6\' 2"  (1.88 m)    Constitutional: Alert and oriented. Well appearing and in no acute distress. Eyes: Conjunctivae are normal. PERRL. EOMI. Head: Atraumatic. Nose: No congestion/rhinnorhea. Mouth/Throat: Mucous membranes are moist.  Oropharynx non-erythematous.  No sublingual edema or tenderness.  No obvious sialolithiasis. Neck: No stridor.   No meningeal signs.  Large soft area of swelling to the left neck just under the mandible.  Not tender.  Not really fluctuant. Cardiovascular: Normal rate, regular rhythm. Good peripheral circulation. Grossly normal heart sounds.   Respiratory: Normal respiratory effort.  No retractions. Lungs CTAB. Gastrointestinal: Soft and nontender. No distention.  Musculoskeletal: No lower extremity tenderness nor edema. No gross deformities of extremities. Neurologic:  Normal speech and language. No gross focal neurologic deficits are appreciated.  Skin:  Skin is warm, dry and intact. No rash noted.   ____________________________________________   LABS (all labs ordered are listed, but only abnormal results are displayed)  Labs Reviewed - No data to display ____________________________________________  EKG   ____________________________________________  RADIOLOGY  No results found.  ____________________________________________   PROCEDURES  Procedure(s) performed:   Procedures   ____________________________________________   INITIAL IMPRESSION / ASSESSMENT AND PLAN / ED COURSE  Considered abscess versus lymphadenopathy versus recurrent cancer as causes for his neck swelling however this was very rapid and onset making those unlikely.  Considered angioedema from being on ACE inhibitor however it is just  under his jaw on the left side making that less likely.  I think the most likely causes some form of sialolithiasis or sialoadenitis.  No indication for antibiotics at this time.  It is slowly improving and he has no airway or trouble swallowing.  No evidence of deep neck space infection.  Consider doing a CT scan however at this time will defer as it does seem to be improving and low likelihood of a change in the management anyway.  If this continues to improve patient will continue to monitor at home.  If it does not go away by about 2 or 3 days he will see his primary doctor if he gets  worse at any point he will return here.  Pertinent labs & imaging results that were available during my care of the patient were reviewed by me and considered in my medical decision making (see chart for details).  A medical screening exam was performed and I feel the patient has had an appropriate workup for their chief complaint at this time and likelihood of emergent condition existing is low. They have been counseled on decision, discharge, follow up and which symptoms necessitate immediate return to the emergency department. They or their family verbally stated understanding and agreement with plan and discharged in stable condition.   ____________________________________________  FINAL CLINICAL IMPRESSION(S) / ED DIAGNOSES  Final diagnoses:  Neck swelling     MEDICATIONS GIVEN DURING THIS VISIT:  Medications - No data to display   NEW OUTPATIENT MEDICATIONS STARTED DURING THIS VISIT:  Discharge Medication List as of 10/25/2018 12:29 AM      Note:  This note was prepared with assistance of Dragon voice recognition software. Occasional wrong-word or sound-a-like substitutions may have occurred due to the inherent limitations of voice recognition software.   Jadore Mcguffin, Corene Cornea, MD 10/25/18 [phone]

## 2018-10-26 ENCOUNTER — Encounter (HOSPITAL_COMMUNITY): Payer: Self-pay | Admitting: Emergency Medicine

## 2018-10-26 ENCOUNTER — Inpatient Hospital Stay (HOSPITAL_COMMUNITY)
Admission: EM | Admit: 2018-10-26 | Discharge: 2018-10-28 | DRG: 824 | Disposition: A | Discharge status: Home / Self Care | Payer: Medicare HMO | Attending: Internal Medicine | Admitting: Internal Medicine

## 2018-10-26 ENCOUNTER — Emergency Department (HOSPITAL_COMMUNITY): Payer: Medicare HMO

## 2018-10-26 ENCOUNTER — Other Ambulatory Visit: Payer: Self-pay

## 2018-10-26 DIAGNOSIS — I10 Essential (primary) hypertension: Secondary | ICD-10-CM | POA: Diagnosis present

## 2018-10-26 DIAGNOSIS — M109 Gout, unspecified: Secondary | ICD-10-CM | POA: Diagnosis present

## 2018-10-26 DIAGNOSIS — K219 Gastro-esophageal reflux disease without esophagitis: Secondary | ICD-10-CM | POA: Diagnosis present

## 2018-10-26 DIAGNOSIS — E119 Type 2 diabetes mellitus without complications: Secondary | ICD-10-CM | POA: Diagnosis present

## 2018-10-26 DIAGNOSIS — I2510 Atherosclerotic heart disease of native coronary artery without angina pectoris: Secondary | ICD-10-CM | POA: Diagnosis present

## 2018-10-26 DIAGNOSIS — C9110 Chronic lymphocytic leukemia of B-cell type not having achieved remission: Principal | ICD-10-CM | POA: Diagnosis present

## 2018-10-26 DIAGNOSIS — Z79899 Other long term (current) drug therapy: Secondary | ICD-10-CM

## 2018-10-26 DIAGNOSIS — A419 Sepsis, unspecified organism: Secondary | ICD-10-CM | POA: Diagnosis present

## 2018-10-26 DIAGNOSIS — Z833 Family history of diabetes mellitus: Secondary | ICD-10-CM

## 2018-10-26 DIAGNOSIS — E1165 Type 2 diabetes mellitus with hyperglycemia: Secondary | ICD-10-CM

## 2018-10-26 DIAGNOSIS — Z87891 Personal history of nicotine dependence: Secondary | ICD-10-CM

## 2018-10-26 DIAGNOSIS — L03116 Cellulitis of left lower limb: Secondary | ICD-10-CM | POA: Diagnosis present

## 2018-10-26 DIAGNOSIS — Z791 Long term (current) use of non-steroidal anti-inflammatories (NSAID): Secondary | ICD-10-CM

## 2018-10-26 DIAGNOSIS — R221 Localized swelling, mass and lump, neck: Secondary | ICD-10-CM | POA: Diagnosis not present

## 2018-10-26 DIAGNOSIS — Z981 Arthrodesis status: Secondary | ICD-10-CM

## 2018-10-26 DIAGNOSIS — Z8249 Family history of ischemic heart disease and other diseases of the circulatory system: Secondary | ICD-10-CM

## 2018-10-26 DIAGNOSIS — W19XXXA Unspecified fall, initial encounter: Secondary | ICD-10-CM | POA: Diagnosis present

## 2018-10-26 DIAGNOSIS — Z79891 Long term (current) use of opiate analgesic: Secondary | ICD-10-CM

## 2018-10-26 DIAGNOSIS — G4730 Sleep apnea, unspecified: Secondary | ICD-10-CM | POA: Diagnosis present

## 2018-10-26 DIAGNOSIS — Z20828 Contact with and (suspected) exposure to other viral communicable diseases: Secondary | ICD-10-CM | POA: Diagnosis present

## 2018-10-26 DIAGNOSIS — C8590 Non-Hodgkin lymphoma, unspecified, unspecified site: Secondary | ICD-10-CM

## 2018-10-26 DIAGNOSIS — Z7982 Long term (current) use of aspirin: Secondary | ICD-10-CM

## 2018-10-26 DIAGNOSIS — E785 Hyperlipidemia, unspecified: Secondary | ICD-10-CM | POA: Diagnosis not present

## 2018-10-26 MED ORDER — LACTATED RINGERS IV BOLUS
1000.0000 mL | Freq: Once | INTRAVENOUS | Status: AC
  Administered 2018-10-27: 1000 mL via INTRAVENOUS

## 2018-10-26 MED ORDER — SODIUM CHLORIDE 0.9 % IV SOLN
2.0000 g | Freq: Once | INTRAVENOUS | Status: AC
  Administered 2018-10-27: 2 g via INTRAVENOUS
  Filled 2018-10-26: qty 2

## 2018-10-26 MED ORDER — VANCOMYCIN HCL 10 G IV SOLR
2000.0000 mg | Freq: Once | INTRAVENOUS | Status: AC
  Administered 2018-10-27: 2000 mg via INTRAVENOUS
  Filled 2018-10-26: qty 2000

## 2018-10-26 NOTE — ED Notes (Signed)
RN attempted IV, unsuccessful.  °

## 2018-10-26 NOTE — ED Notes (Signed)
Pt is not oriented to date, stating he broke his leg two days ago, had surgery 2 months ago.  Left leg is red, swollen and tight.

## 2018-10-26 NOTE — ED Triage Notes (Signed)
Per EMS, Pt from home called EMS for generalized weakness and a mass on the left side of his neck.  Pt normally walks around uses a walker however today he has been too weak.  Pt was seen at [facility] earlier however continues to get weak.  Pt is alert and oriented however EMS reports he asked the same questions several times.

## 2018-10-27 ENCOUNTER — Inpatient Hospital Stay (HOSPITAL_COMMUNITY): Payer: Medicare HMO

## 2018-10-27 ENCOUNTER — Observation Stay (HOSPITAL_BASED_OUTPATIENT_CLINIC_OR_DEPARTMENT_OTHER): Payer: Medicare HMO

## 2018-10-27 ENCOUNTER — Emergency Department (HOSPITAL_COMMUNITY): Payer: Medicare HMO

## 2018-10-27 ENCOUNTER — Other Ambulatory Visit: Payer: Self-pay

## 2018-10-27 ENCOUNTER — Encounter (HOSPITAL_COMMUNITY): Payer: Self-pay | Admitting: Internal Medicine

## 2018-10-27 DIAGNOSIS — Z7982 Long term (current) use of aspirin: Secondary | ICD-10-CM | POA: Diagnosis not present

## 2018-10-27 DIAGNOSIS — Z87891 Personal history of nicotine dependence: Secondary | ICD-10-CM

## 2018-10-27 DIAGNOSIS — L03116 Cellulitis of left lower limb: Secondary | ICD-10-CM | POA: Diagnosis present

## 2018-10-27 DIAGNOSIS — Z79899 Other long term (current) drug therapy: Secondary | ICD-10-CM | POA: Diagnosis not present

## 2018-10-27 DIAGNOSIS — I10 Essential (primary) hypertension: Secondary | ICD-10-CM | POA: Diagnosis present

## 2018-10-27 DIAGNOSIS — R6520 Severe sepsis without septic shock: Secondary | ICD-10-CM

## 2018-10-27 DIAGNOSIS — Z791 Long term (current) use of non-steroidal anti-inflammatories (NSAID): Secondary | ICD-10-CM | POA: Diagnosis not present

## 2018-10-27 DIAGNOSIS — E119 Type 2 diabetes mellitus without complications: Secondary | ICD-10-CM | POA: Diagnosis present

## 2018-10-27 DIAGNOSIS — A419 Sepsis, unspecified organism: Secondary | ICD-10-CM

## 2018-10-27 DIAGNOSIS — I2510 Atherosclerotic heart disease of native coronary artery without angina pectoris: Secondary | ICD-10-CM | POA: Diagnosis present

## 2018-10-27 DIAGNOSIS — Z8249 Family history of ischemic heart disease and other diseases of the circulatory system: Secondary | ICD-10-CM | POA: Diagnosis not present

## 2018-10-27 DIAGNOSIS — W19XXXA Unspecified fall, initial encounter: Secondary | ICD-10-CM | POA: Diagnosis present

## 2018-10-27 DIAGNOSIS — G4730 Sleep apnea, unspecified: Secondary | ICD-10-CM | POA: Diagnosis present

## 2018-10-27 DIAGNOSIS — R509 Fever, unspecified: Secondary | ICD-10-CM | POA: Diagnosis not present

## 2018-10-27 DIAGNOSIS — Z833 Family history of diabetes mellitus: Secondary | ICD-10-CM | POA: Diagnosis not present

## 2018-10-27 DIAGNOSIS — R609 Edema, unspecified: Secondary | ICD-10-CM

## 2018-10-27 DIAGNOSIS — Z79891 Long term (current) use of opiate analgesic: Secondary | ICD-10-CM | POA: Diagnosis not present

## 2018-10-27 DIAGNOSIS — R590 Localized enlarged lymph nodes: Secondary | ICD-10-CM

## 2018-10-27 DIAGNOSIS — Z981 Arthrodesis status: Secondary | ICD-10-CM | POA: Diagnosis not present

## 2018-10-27 DIAGNOSIS — K219 Gastro-esophageal reflux disease without esophagitis: Secondary | ICD-10-CM | POA: Diagnosis present

## 2018-10-27 DIAGNOSIS — Z20828 Contact with and (suspected) exposure to other viral communicable diseases: Secondary | ICD-10-CM | POA: Diagnosis present

## 2018-10-27 DIAGNOSIS — C9110 Chronic lymphocytic leukemia of B-cell type not having achieved remission: Principal | ICD-10-CM

## 2018-10-27 DIAGNOSIS — M109 Gout, unspecified: Secondary | ICD-10-CM | POA: Diagnosis present

## 2018-10-27 DIAGNOSIS — R221 Localized swelling, mass and lump, neck: Secondary | ICD-10-CM | POA: Diagnosis present

## 2018-10-27 DIAGNOSIS — Z8572 Personal history of non-Hodgkin lymphomas: Secondary | ICD-10-CM

## 2018-10-27 LAB — CBC WITH DIFFERENTIAL/PLATELET
Abs Immature Granulocytes: 0.05 10*3/uL (ref 0.00–0.07)
Basophils Absolute: 0 10*3/uL (ref 0.0–0.1)
Basophils Relative: 0 %
Eosinophils Absolute: 0.1 10*3/uL (ref 0.0–0.5)
Eosinophils Relative: 0 %
HCT: 40.5 % (ref 39.0–52.0)
Hemoglobin: 14.7 g/dL (ref 13.0–17.0)
Immature Granulocytes: 0 %
Lymphocytes Relative: 36 %
Lymphs Abs: 4.1 10*3/uL — ABNORMAL HIGH (ref 0.7–4.0)
MCH: 31.5 pg (ref 26.0–34.0)
MCHC: 36.3 g/dL — ABNORMAL HIGH (ref 30.0–36.0)
MCV: 86.7 fL (ref 80.0–100.0)
Monocytes Absolute: 2.3 10*3/uL — ABNORMAL HIGH (ref 0.1–1.0)
Monocytes Relative: 20 %
Neutro Abs: 5 10*3/uL (ref 1.7–7.7)
Neutrophils Relative %: 44 %
Platelets: 168 10*3/uL (ref 150–400)
RBC: 4.67 MIL/uL (ref 4.22–5.81)
RDW: 12.8 % (ref 11.5–15.5)
WBC: 11.5 10*3/uL — ABNORMAL HIGH (ref 4.0–10.5)
nRBC: 0 % (ref 0.0–0.2)

## 2018-10-27 LAB — GLUCOSE, CAPILLARY
Glucose-Capillary: 148 mg/dL — ABNORMAL HIGH (ref 70–99)
Glucose-Capillary: 153 mg/dL — ABNORMAL HIGH (ref 70–99)
Glucose-Capillary: 168 mg/dL — ABNORMAL HIGH (ref 70–99)
Glucose-Capillary: 150 mg/dL — ABNORMAL HIGH (ref 70–99)

## 2018-10-27 LAB — PROTIME-INR
INR: 1.1 (ref 0.8–1.2)
Prothrombin Time: 13.6 seconds (ref 11.4–15.2)

## 2018-10-27 LAB — COMPREHENSIVE METABOLIC PANEL
ALT: 23 U/L (ref 0–44)
AST: 21 U/L (ref 15–41)
Albumin: 4.4 g/dL (ref 3.5–5.0)
Alkaline Phosphatase: 71 U/L (ref 38–126)
Anion gap: 11 (ref 5–15)
BUN: 8 mg/dL (ref 8–23)
CO2: 27 mmol/L (ref 22–32)
Calcium: 9.5 mg/dL (ref 8.9–10.3)
Chloride: 98 mmol/L (ref 98–111)
Creatinine, Ser: 1.18 mg/dL (ref 0.61–1.24)
GFR calc Af Amer: >60 mL/min (ref >60)
GFR calc non Af Amer: >60 mL/min (ref >60)
Glucose, Bld: 177 mg/dL — ABNORMAL HIGH (ref 70–99)
Potassium: 3.7 mmol/L (ref 3.5–5.1)
Sodium: 136 mmol/L (ref 135–145)
Total Bilirubin: 1.3 mg/dL — ABNORMAL HIGH (ref 0.3–1.2)
Total Protein: 7.1 g/dL (ref 6.5–8.1)

## 2018-10-27 LAB — URINALYSIS, ROUTINE W REFLEX MICROSCOPIC
Bilirubin Urine: NEGATIVE
Glucose, UA: NEGATIVE mg/dL
Hgb urine dipstick: NEGATIVE
Ketones, ur: NEGATIVE mg/dL
Leukocytes,Ua: NEGATIVE
Nitrite: NEGATIVE
Protein, ur: NEGATIVE mg/dL
Specific Gravity, Urine: 1.016 (ref 1.005–1.030)
pH: 7 (ref 5.0–8.0)

## 2018-10-27 LAB — SARS CORONAVIRUS 2 BY RT PCR (HOSPITAL ORDER, PERFORMED IN ~~LOC~~ HOSPITAL LAB): SARS Coronavirus 2: NEGATIVE

## 2018-10-27 LAB — LACTIC ACID, PLASMA: Lactic Acid, Venous: 1.3 mmol/L (ref 0.5–1.9)

## 2018-10-27 MED ORDER — VANCOMYCIN HCL 10 G IV SOLR
1250.0000 mg | Freq: Two times a day (BID) | INTRAVENOUS | Status: DC
  Filled 2018-10-27: qty 1250

## 2018-10-27 MED ORDER — TIZANIDINE HCL 4 MG PO TABS: 4.0000 mg | ORAL_TABLET | Freq: Two times a day (BID) | ORAL | Status: DC | PRN

## 2018-10-27 MED ORDER — ONDANSETRON HCL 4 MG PO TABS: 4.0000 mg | ORAL_TABLET | Freq: Four times a day (QID) | ORAL | Status: DC | PRN

## 2018-10-27 MED ORDER — ASPIRIN EC 325 MG PO TBEC: 325.0000 mg | DELAYED_RELEASE_TABLET | Freq: Every day | ORAL | Status: DC

## 2018-10-27 MED ORDER — ATORVASTATIN CALCIUM 40 MG PO TABS
40.0000 mg | ORAL_TABLET | Freq: Every day | ORAL | Status: DC
  Administered 2018-10-27 – 2018-10-28 (×2): 40 mg via ORAL
  Filled 2018-10-27 (×2): qty 1

## 2018-10-27 MED ORDER — VANCOMYCIN HCL IN DEXTROSE 1-5 GM/200ML-% IV SOLN
1000.0000 mg | Freq: Two times a day (BID) | INTRAVENOUS | Status: DC
  Administered 2018-10-27 – 2018-10-28 (×2): 1000 mg via INTRAVENOUS
  Filled 2018-10-27 (×4): qty 200

## 2018-10-27 MED ORDER — ONDANSETRON HCL 4 MG/2ML IJ SOLN: 4.0000 mg | Freq: Four times a day (QID) | INTRAMUSCULAR | Status: DC | PRN

## 2018-10-27 MED ORDER — IOHEXOL 300 MG/ML  SOLN
75.0000 mL | Freq: Once | INTRAMUSCULAR | Status: AC | PRN
  Administered 2018-10-27: 75 mL via INTRAVENOUS

## 2018-10-27 MED ORDER — ACETAMINOPHEN 325 MG PO TABS
650.0000 mg | ORAL_TABLET | Freq: Four times a day (QID) | ORAL | Status: DC | PRN
  Administered 2018-10-27 – 2018-10-28 (×3): 650 mg via ORAL
  Filled 2018-10-27 (×3): qty 2

## 2018-10-27 MED ORDER — FAMOTIDINE 20 MG PO TABS: 20.0000 mg | ORAL_TABLET | Freq: Every evening | ORAL | Status: DC | PRN

## 2018-10-27 MED ORDER — POTASSIUM CHLORIDE CRYS ER 10 MEQ PO TBCR
10.0000 meq | EXTENDED_RELEASE_TABLET | Freq: Every day | ORAL | Status: DC
  Administered 2018-10-27 – 2018-10-28 (×2): 10 meq via ORAL
  Filled 2018-10-27 (×2): qty 1

## 2018-10-27 MED ORDER — ACETAMINOPHEN 650 MG RE SUPP: 650.0000 mg | Freq: Four times a day (QID) | RECTAL | Status: DC | PRN

## 2018-10-27 MED ORDER — SODIUM CHLORIDE 0.9 % IV SOLN
2.0000 g | Freq: Three times a day (TID) | INTRAVENOUS | Status: DC
  Administered 2018-10-27 – 2018-10-28 (×4): 2 g via INTRAVENOUS
  Filled 2018-10-27 (×4): qty 2

## 2018-10-27 MED ORDER — HYDROCODONE-ACETAMINOPHEN 5-325 MG PO TABS
1.0000 | ORAL_TABLET | Freq: Four times a day (QID) | ORAL | Status: DC | PRN
  Administered 2018-10-27: 1 via ORAL
  Administered 2018-10-28: 2 via ORAL
  Filled 2018-10-27: qty 1
  Filled 2018-10-27: qty 2

## 2018-10-27 MED ORDER — ALLOPURINOL 300 MG PO TABS
300.0000 mg | ORAL_TABLET | Freq: Every day | ORAL | Status: DC
  Administered 2018-10-27 – 2018-10-28 (×2): 300 mg via ORAL
  Filled 2018-10-27 (×2): qty 1

## 2018-10-27 MED ORDER — DICLOFENAC SODIUM 1 % TD GEL
2.0000 g | TRANSDERMAL | Status: DC | PRN
  Filled 2018-10-27: qty 100

## 2018-10-27 MED ORDER — ATENOLOL 50 MG PO TABS
25.0000 mg | ORAL_TABLET | Freq: Every day | ORAL | Status: DC
  Administered 2018-10-27 – 2018-10-28 (×2): 25 mg via ORAL
  Filled 2018-10-27 (×2): qty 1

## 2018-10-27 MED ORDER — IOHEXOL 300 MG/ML  SOLN
100.0000 mL | Freq: Once | INTRAMUSCULAR | Status: AC | PRN
  Administered 2018-10-27: 100 mL via INTRAVENOUS

## 2018-10-27 MED ORDER — LIDOCAINE HCL (PF) 1 % IJ SOLN
INTRAMUSCULAR | Status: AC
  Filled 2018-10-27: qty 30

## 2018-10-27 MED ORDER — LISINOPRIL 40 MG PO TABS
40.0000 mg | ORAL_TABLET | Freq: Every day | ORAL | Status: DC
  Administered 2018-10-27 – 2018-10-28 (×2): 40 mg via ORAL
  Filled 2018-10-27 (×2): qty 1

## 2018-10-27 MED ORDER — SODIUM CHLORIDE 0.9 % IV SOLN
INTRAVENOUS | Status: AC
  Administered 2018-10-27 (×2): via INTRAVENOUS

## 2018-10-27 MED ORDER — INSULIN ASPART 100 UNIT/ML ~~LOC~~ SOLN
0.0000 [IU] | Freq: Three times a day (TID) | SUBCUTANEOUS | Status: DC
  Administered 2018-10-27: 1 [IU] via SUBCUTANEOUS
  Administered 2018-10-27 – 2018-10-28 (×3): 2 [IU] via SUBCUTANEOUS
  Administered 2018-10-28: 1 [IU] via SUBCUTANEOUS

## 2018-10-27 NOTE — ED Notes (Signed)
Patient transported to CT 

## 2018-10-27 NOTE — Consult Note (Signed)
Referral MD  Reason for Referral: Left neck mass; history of CLL  Chief Complaint  Patient presents with  . Weakness  . Mass  : I had this mass on my neck.  HPI: Ronald Rice is well-known to me.  He is a very nice 73 year old African-American male.  He is from [place].  I saw him back in September 2018 for the first time.  He had just moved here from [place].  Of note, he and his wife got married on my birthday back in May 2019.  He had treatment for non-Hodgkin lymphoma in [place].  He is not sure exactly what he was taking.  He does remember Rituxan.  He was scheduled to see me on Monday, 8 June.  This past week, he began to note some pain and swelling in the left neck.  There is no dysphasia or odynophagia.  He had no ear pain.  He said he is having some sweats.  There is no fever.  He has had no weight loss.  He has been eating well.  He has had no obvious change in bowel or bladder habits.  He came to the emergency room.  He underwent a CT scan of the neck on 10/27/2018.  The CT scan of the neck showed a large necrotic lymph node in the left neck.  It measured 4.6 x 5.5 cm.  There are other lymph nodes that were noted in the neck.  He did have a lymph node biopsy today.  When he came in, his labs showed a white cell count of 11.5.  Hemoglobin 14.7.  Platelet count 168,000.  His blood sugar was 177.  His BUN was 8 creatinine 1.18.  Calcium 9.5 with an albumin of 4.4.  We were asked to see him to help with management issues.    Past Medical History:  Diagnosis Date  . Cancer ([place])    Leukemia   . CLL (chronic lymphocytic leukemia) ([place])    see records in care everywhere from [place]  . Coronary artery disease    minimal nonobstructive by 10/31/16 cath at [facility] in [place]  . Diabetes mellitus without complication ([place])   . GERD (gastroesophageal reflux disease)   . Hypertension   . Sleep apnea    does not wear CPAP  . Wears dentures   . Wears glasses   :  Past  Surgical History:  Procedure Laterality Date  . ANTERIOR CERVICAL DECOMP/DISCECTOMY FUSION N/A 04/20/2017   Procedure: ANTERIOR CERVICAL DECOMPRESSION FUSION, CERVICAL 3-4, CERVICAL 4-5 WITH INSTRUMENTATION AND ALLOGRAFT; REQUEST 3.5 HOURS;  Surgeon: Phylliss Bob, MD;  Location: [place];  Service: Orthopedics;  Laterality: N/A;  ANTERIOR CERVICAL DECOMPRESSION FUSION, CERVICAL 3-4, CERVICAL 4-5 WITH INSTRUMENTATION AND ALLOGRAFT; REQUEST 3.5 HOURS  . BACK SURGERY    . CARDIAC CATHETERIZATION     in Care Everywhere 11/01/16  . COLONOSCOPY    . MULTIPLE TOOTH EXTRACTIONS    . ORIF ANKLE FRACTURE Left 09/13/2018   Procedure: OPEN REDUCTION INTERNAL FIXATION (ORIF) LATERAL LEFT ANKLE FRACTURE;  Surgeon: Dorna Leitz, MD;  Location: [place];  Service: Orthopedics;  Laterality: Left;  . SYNDESMOSIS REPAIR Left 09/13/2018   Procedure: OPEN REDUCTION INTERNAL FIXATION SYNDESMOSIS REPAIR LEFT ANKLE;  Surgeon: Dorna Leitz, MD;  Location: [place];  Service: Orthopedics;  Laterality: Left;  :   Current Facility-Administered Medications:  .  0.9 %  sodium chloride infusion, , Intravenous, Continuous, Rise Patience, MD, Last Rate: 125 mL/hr at 10/27/18 1602 .  acetaminophen (  TYLENOL) tablet 650 mg, 650 mg, Oral, Q6H PRN, 650 mg at 10/27/18 0912 **OR** acetaminophen (TYLENOL) suppository 650 mg, 650 mg, Rectal, Q6H PRN, Rise Patience, MD .  allopurinol (ZYLOPRIM) tablet 300 mg, 300 mg, Oral, Daily, Rise Patience, MD, 300 mg at 10/27/18 0908 .  atenolol (TENORMIN) tablet 25 mg, 25 mg, Oral, Daily, Rise Patience, MD, 25 mg at 10/27/18 0908 .  atorvastatin (LIPITOR) tablet 40 mg, 40 mg, Oral, Daily, Rise Patience, MD, 40 mg at 10/27/18 0907 .  ceFEPIme (MAXIPIME) 2 g in sodium chloride 0.9 % 100 mL IVPB, 2 g, Intravenous, Q8H, Donne Hazel, MD, Last Rate: 200 mL/hr at 10/27/18 1151, 2 g at 10/27/18 1151 .  famotidine (PEPCID) tablet 20 mg, 20 mg, Oral, QHS PRN, Rise Patience, MD .  HYDROcodone-acetaminophen (NORCO/VICODIN) 5-325 MG per tablet 1-2 tablet, 1-2 tablet, Oral, Q6H PRN, Rise Patience, MD .  insulin aspart (novoLOG) injection 0-9 Units, 0-9 Units, Subcutaneous, TID WC, Rise Patience, MD, 2 Units at 10/27/18 1717 .  lidocaine (PF) (XYLOCAINE) 1 % injection, , , ,  .  lisinopril (ZESTRIL) tablet 40 mg, 40 mg, Oral, Daily, Rise Patience, MD, 40 mg at 10/27/18 0907 .  ondansetron (ZOFRAN) tablet 4 mg, 4 mg, Oral, Q6H PRN **OR** ondansetron (ZOFRAN) injection 4 mg, 4 mg, Intravenous, Q6H PRN, Rise Patience, MD .  potassium chloride (K-DUR) CR tablet 10 mEq, 10 mEq, Oral, Daily, Rise Patience, MD, 10 mEq at 10/27/18 0908 .  tiZANidine (ZANAFLEX) tablet 4 mg, 4 mg, Oral, Q12H PRN, Rise Patience, MD .  vancomycin (VANCOCIN) IVPB 1000 mg/200 mL premix, 1,000 mg, Intravenous, Q12H, Rolla Flatten, RPH:  . allopurinol  300 mg Oral Daily  . atenolol  25 mg Oral Daily  . atorvastatin  40 mg Oral Daily  . insulin aspart  0-9 Units Subcutaneous TID WC  . lidocaine (PF)      . lisinopril  40 mg Oral Daily  . potassium chloride  10 mEq Oral Daily  :  No Known Allergies:  Family History  Problem Relation Age of Onset  . Heart attack Mother   . Diabetes Sister   . Diabetes Brother   :  Social History   Socioeconomic History  . Marital status: Single    Spouse name: Not on file  . Number of children: 1  . Years of education: 22  . Highest education level: Not on file  Occupational History  . Occupation: Retired  Scientific laboratory technician  . Financial resource strain: Not on file  . Food insecurity:    Worry: Not on file    Inability: Not on file  . Transportation needs:    Medical: Not on file    Non-medical: Not on file  Tobacco Use  . Smoking status: Former Smoker    Types: Pipe, Landscape architect, Cigarettes  . Smokeless tobacco: Never Used  Substance and Sexual Activity  . Alcohol use: Yes    Frequency: Never     Comment: occasional  . Drug use: No  . Sexual activity: Not on file  Lifestyle  . Physical activity:    Days per week: Not on file    Minutes per session: Not on file  . Stress: Not on file  Relationships  . Social connections:    Talks on phone: Not on file    Gets together: Not on file    Attends religious service: Not on file  Active member of club or organization: Not on file    Attends meetings of clubs or organizations: Not on file    Relationship status: Not on file  . Intimate partner violence:    Fear of current or ex partner: Not on file    Emotionally abused: Not on file    Physically abused: Not on file    Forced sexual activity: Not on file  Other Topics Concern  . Not on file  Social History Narrative   Lives with wife   Caffeine use: Drinks coffee daily (2 cups)   Right handed   :  Review of Systems  Constitutional: Negative.   HENT: Negative.   Eyes: Negative.   Respiratory: Negative.   Cardiovascular: Negative.   Gastrointestinal: Negative.   Genitourinary: Negative.   Musculoskeletal: Negative.   Skin: Negative.   Neurological: Negative.   Endo/Heme/Allergies: Negative.   Psychiatric/Behavioral: Negative.      Exam: Well-developed and well-nourished African-American male.  Head and neck exam shows the adenopathy in the left neck.  It is quite extensive.  It is nontender.  There might be a slightly enlarged left tonsil.  His lungs are clear bilaterally.  Cardiac exam regular rate and rhythm with no murmurs, rubs or bruits.  Abdomen is soft.  He has good bowel sounds.  There is no fluid wave.  There is no palpable liver or spleen tip.  Extremities shows no clubbing, cyanosis or edema.  Neurological exam is nonfocal.  Skin exam shows no rashes, ecchymoses or petechia.  Patient Vitals for the past 24 hrs:  BP Temp Temp src Pulse Resp SpO2 Height Weight  10/27/18 1724 134/85 98.1 F (36.7 C) Oral 84 15 97 % - -  10/27/18 1700 (!) 145/91 98.1 F (36.7  C) Oral - 16 99 % - -  10/27/18 0907 (!) 143/91 98.2 F (36.8 C) Oral (!) 101 - 93 % - -  10/27/18 0640 - - - - - - 6\' 2"  (1.88 m) 289 lb 1.6 oz (131.1 kg)  10/27/18 0259 - 100.3 F (37.9 C) Rectal - - - - -  10/27/18 0130 (!) 158/93 - - - 19 - - -  10/27/18 0100 (!) 147/96 - - 97 17 98 % - -  10/27/18 0000 (!) 142/97 - - - (!) 23 - - -  10/26/18 2345 (!) 143/93 - - (!) 103 (!) 26 96 % - -  10/26/18 2333 - - - - - - 6\' 2"  (1.88 m) 284 lb (128.8 kg)  10/26/18 2331 - (!) 101.5 F (38.6 C) Oral (!) 104 - 97 % - -     Recent Labs    10/26/18 2345  WBC 11.5*  HGB 14.7  HCT 40.5  PLT 168   Recent Labs    10/26/18 2345  NA 136  K 3.7  CL 98  CO2 27  GLUCOSE 177*  BUN 8  CREATININE 1.18  CALCIUM 9.5    Blood smear review: None   Pathology: pending    Assessment and Plan: Ronald Rice is a 73 year old African-American male with a history of CLL.  He has not had any treatment for 3 or 4 years.  He now has adenopathy in the left neck.  He is going to go for CT scans to see if there is any adenopathy elsewhere.  I have to believe that this adenopathy is going to be lymphoma.  I suspect that we might be looking at Richter's transformation and that this  is going to be a large cell non-Hodgkin's lymphoma.  He is in good shape.  As such, we can certainly be aggressive with therapy.  If this is non-Hodgkin's lymphoma, he will need chemotherapy.  While he is in the hospital, I probably would go ahead and get an echocardiogram on him to assess for his cardiac function.  It will be interesting to see what the CT scan shows.  His wife was listening on the cell phone.  She understands what is also going on.  We will just have to await the results of the biopsy.  I will get an LDH.  This can certainly help [place] out.  I spent about an hour with him.  I answered his questions and his wife's questions via cell phone.   Lattie Haw, MD  1 peter 5:2

## 2018-10-27 NOTE — ED Provider Notes (Signed)
Emergency Department Provider Note   I have reviewed the triage vital signs and the nursing notes.   HISTORY  Chief Complaint Weakness and Mass   HPI Ronald Rice is a 73 y.o. male who returns to the emergency department secondary to weakness.  Patient was seen by myself yesterday when he was here with a left-sided neck mass that seem to be consistent with sialolithiasis and went to see his doctor today who made some referrals in order some tests and then on the way home the patient became acutely weak, disoriented and wife states is how he normally is when he gets an infection.  He did not improve so she called ambulance and brought him here for further evaluation.  After examining him the mass in his left neck is smaller than it was yesterday but is now firm and tender to palpation.  Still not causing any airway, vascular or of esophageal compromise but it seems to be much more tender than yesterday and much more firm.  Also noticed that his left leg was warm and swollen when asked about this him and his wife both state that has been going on ever since the surgery.  Has not had any cough, nausea, vomiting per the wife's report.  She does state that he is having more trouble controlling his urine today to got weak.  He would get up to go to the bathroom but not make it there in time.  She is unsure of if it is incontinence or if it is too weak to get to the bathroom in a timely fashion.  No other rashes.  No abdominal pain.  No chest pain.   History obtained from patient and wife secondary to patient's mild confusion.  She states he does have some recent short-term memory loss but this is been progressively worsening not new.  No other associated or modifying symptoms.    Past Medical History:  Diagnosis Date   Cancer ([place])    Leukemia    CLL (chronic lymphocytic leukemia) ([place])    see records in care everywhere from [facility]   Coronary artery disease    minimal nonobstructive by  10/31/16 cath at [facility] in [place]   Diabetes mellitus without complication ([place])    GERD (gastroesophageal reflux disease)    Hypertension    Sleep apnea    does not wear CPAP   Wears dentures    Wears glasses     Patient Active Problem List   Diagnosis Date Noted   Displaced fracture of lateral malleolus of left fibula 09/13/2018   Dream enactment behavior 07/26/2018   Snoring 07/26/2018   Hypersomnia with sleep apnea 07/26/2018   Fever    Acute cystitis with hematuria    Ischemic stroke ([place]) 11/22/2017   Right hip pain 07/18/2017   Cerebellar ataxia in diseases classified elsewhere ([place]) 07/18/2017   Radiculopathy 04/20/2017   Chronic lymphoid leukemia ([place]) 01/25/2017   CAD (coronary artery disease) 10/31/2016   Influenza A 05/24/2016   Acute bronchitis 05/22/2016   Chronic leukemia ([place]) 05/22/2016   Weakness 05/22/2016   Sepsis ([place]) 05/22/2016   Pedal edema 05/22/2016   Gait abnormality 07/28/2015   Nonspecific elevation of levels of transaminase and lactic acid dehydrogenase (LDH) 03/02/2015   Pseudarthrosis after fusion or arthrodesis 06/03/2014   Fusion of spine of cervical region 06/05/2013   Fusion of lumbar spine 03/07/2012   Benign prostatic hyperplasia 06/15/2011   Liver cyst 07/29/2009   Kidney cyst, acquired 06/17/2009  Type 2 diabetes mellitus without complications ([place]) [birth year]1937   Diverticulosis of colon 08/03/2007   Hyperlipidemia 03/14/2007   Neuropathy, peripheral 12/03/2005   Allergic rhinitis 03/01/2000   Essential (primary) hypertension 10/03/1996   GERD (gastroesophageal reflux disease) 07/02/1995    Past Surgical History:  Procedure Laterality Date   ANTERIOR CERVICAL DECOMP/DISCECTOMY FUSION N/A 04/20/2017   Procedure: ANTERIOR CERVICAL DECOMPRESSION FUSION, CERVICAL 3-4, CERVICAL 4-5 WITH INSTRUMENTATION AND ALLOGRAFT; REQUEST 3.5 HOURS;  Surgeon: Phylliss Bob, MD;  Location: [place];   Service: Orthopedics;  Laterality: N/A;  ANTERIOR CERVICAL DECOMPRESSION FUSION, CERVICAL 3-4, CERVICAL 4-5 WITH INSTRUMENTATION AND ALLOGRAFT; REQUEST 3.5 HOURS   BACK SURGERY     CARDIAC CATHETERIZATION     in Care Everywhere 11/01/16   COLONOSCOPY     MULTIPLE TOOTH EXTRACTIONS     ORIF ANKLE FRACTURE Left 09/13/2018   Procedure: OPEN REDUCTION INTERNAL FIXATION (ORIF) LATERAL LEFT ANKLE FRACTURE;  Surgeon: Dorna Leitz, MD;  Location: [place];  Service: Orthopedics;  Laterality: Left;   SYNDESMOSIS REPAIR Left 09/13/2018   Procedure: OPEN REDUCTION INTERNAL FIXATION SYNDESMOSIS REPAIR LEFT ANKLE;  Surgeon: Dorna Leitz, MD;  Location: [place];  Service: Orthopedics;  Laterality: Left;    Current Outpatient Rx   Order #: [id number] Class: Historical Med   Order #: [id number] Class: Historical Med   Order #: [id number] Class: Normal   Order #: [id number] Class: Historical Med   Order #: [id number] Class: Historical Med   Order #: [id number] Class: Historical Med   Order #: [id number] Class: Historical Med   Order #: [id number] Class: Historical Med   Order #: [id number] Class: Normal   Order #: [id number] Class: Historical Med   Order #: [id number] Class: Historical Med   Order #: [id number] Class: Historical Med   Order #: [id number] Class: Historical Med   Order #: [id number] Class: Historical Med   Order #: [id number] Class: Historical Med   Order #: [id number] Class: Historical Med    Allergies Patient has no known allergies.  Family History  Problem Relation Age of Onset   Heart attack Mother    Diabetes Sister    Diabetes Brother     Social History Social History   Tobacco Use   Smoking status: Former Smoker    Types: Pipe, Landscape architect, Cigarettes   Smokeless tobacco: Never Used  Substance Use Topics   Alcohol use: Yes    Frequency: Never    Comment: occasional   Drug use: No    Review of Systems  All other systems negative except as documented in the HPI. All  pertinent positives and negatives as reviewed in the HPI. ____________________________________________   PHYSICAL EXAM:  VITAL SIGNS: ED Triage Vitals  Enc Vitals Group     BP --      Pulse Rate 10/26/18 2331 (!) 104     Resp --      Temp 10/26/18 2331 (!) 101.5 F (38.6 C)     Temp Source 10/26/18 2331 Oral     SpO2 10/26/18 2331 97 %     Weight 10/26/18 2333 284 lb (128.8 kg)     Height 10/26/18 2333 6\' 2"  (1.88 m)    Constitutional: Alert and oriented. Well appearing and in no acute distress. Eyes: Conjunctivae are normal. PERRL. EOMI. Head: Atraumatic. Nose: No congestion/rhinnorhea. Mouth/Throat: Mucous membranes are moist.  Oropharynx non-erythematous. Neck: No stridor.  No meningeal signs.  Decreased size in left neck mass but is now firm, tender to touch. Cardiovascular: tachycardic rate, regular rhythm. Good peripheral circulation. Grossly normal heart sounds.   Respiratory: Normal respiratory effort.  No retractions. Lungs CTAB. Gastrointestinal: Soft and nontender. No distention.  Musculoskeletal: No lower extremity tenderness nor edema. No gross deformities of extremities. Neurologic:  Normal speech and language. No gross focal neurologic deficits are appreciated.  Skin: Left lower extremity is more swollen than right and is warm and mildly erythematous up approximately two thirds of the way up his calf.  No posterior calf pain.   ____________________________________________   LABS (all labs ordered are listed, but only abnormal results are displayed)  Labs Reviewed  CULTURE, BLOOD (ROUTINE X 2)  CULTURE, BLOOD (ROUTINE X 2)  SARS CORONAVIRUS 2 (HOSPITAL ORDER, [facility])  LACTIC ACID, PLASMA  LACTIC ACID, PLASMA  COMPREHENSIVE METABOLIC PANEL  CBC WITH DIFFERENTIAL/PLATELET  URINALYSIS, ROUTINE W REFLEX MICROSCOPIC   ____________________________________________  EKG   EKG Interpretation  Date/Time:  Thursday October 26 2018  23:40:06 EDT Ventricular Rate:  103 PR Interval:    QRS Duration: 75 QT Interval:  311 QTC Calculation: 407 R Axis:   55 Text Interpretation:  Sinus tachycardia Borderline T abnormalities, inferior leads No significant change since last tracing Confirmed by Merrily Pew [id number]) on 10/27/2018 12:04:02 AM       ____________________________________________  RADIOLOGY  Dg Tibia/fibula Left  Result Date: 10/27/2018 CLINICAL DATA:  73 year old male with left leg swelling. EXAM: LEFT TIBIA AND FIBULA - 2 VIEW COMPARISON:  Left ankle radiograph dated 09/10/2018 FINDINGS: There is no acute fracture or dislocation. Distal fibular fixation side plate and screw noted. The hardware appears intact and with good contact with the bone. The ankle mortise is intact. Tiny corticated bone fragment anterior to the tibial plafond and likely chronic. There is diffuse subcutaneous soft tissue edema which may represent cellulitis. Clinical correlation is recommended. No soft tissue gas. IMPRESSION: 1. Diffuse subcutaneous edema, likely cellulitis. 2. No acute fracture or dislocation. 3. Hardware intact. Electronically Signed   By: Anner Crete M.D.   On: 10/27/2018 00:30   Ct Soft Tissue Neck W Contrast  Result Date: 10/27/2018 CLINICAL DATA:  Neck mass.  History of malignancy. EXAM: CT NECK WITH CONTRAST TECHNIQUE: Multidetector CT imaging of the neck was performed using the standard protocol following the bolus administration of intravenous contrast. CONTRAST:  44mL OMNIPAQUE IOHEXOL 300 MG/ML  SOLN COMPARISON:  CTA neck 11/22/2017 FINDINGS: PHARYNX AND LARYNX: --Nasopharynx: Fossae of [place] are clear. Normal adenoid tonsils for age. --Oral cavity and oropharynx: The palatine and lingual tonsils are normal. The visible oral cavity and floor of mouth are normal. --Hypopharynx: The left piriform sinus is effaced in the hypopharynx is markedly rightward deviated. --Larynx: Normal epiglottis and pre-epiglottic  space. Normal aryepiglottic and vocal folds. --Retropharyngeal space: There is a small retropharyngeal effusion measuring approximately 7 mm in thickness. SALIVARY GLANDS: --Parotid: No mass lesion or inflammation. No sialolithiasis or ductal dilatation. --Submandibular: Symmetric without inflammation. No sialolithiasis or ductal dilatation. --Sublingual: Normal. No ranula or other visible lesion of the base of tongue and floor of mouth. THYROID: 10 mm right thyroid nodule. LYMPH NODES: There are numerous enlarged lymph nodes throughout the neck. At left level 2 A, there is a necrotic node that measures 4.6 x 5.5 cm. Left level 5 A node measures 19 mm. Left level 5 B node measures 21 mm. There are numerous right level 2 and 3 lymph nodes, predominantly subcentimeter. There are multiple enlarged left supraclavicular nodes. VASCULAR: Major cervical vessels are patent. LIMITED INTRACRANIAL: Normal. VISUALIZED ORBITS: Normal. MASTOIDS AND VISUALIZED PARANASAL SINUSES: No fluid levels or advanced  mucosal thickening. No mastoid effusion. SKELETON: C3-5 ACDF UPPER CHEST: Clear. OTHER: None. IMPRESSION: 1. Severe left-sided cervical and supraclavicular lymphadenopathy, including necrotic left level 2A node that measures 4.6 x 5.5 cm. There also numerous right-sided cervical lymph nodes, which are predominantly subcentimeter. Findings are most suggestive of lymphoma. 2. Small retropharyngeal effusion, likely reactive. Electronically Signed   By: Ulyses Jarred M.D.   On: 10/27/2018 02:19   Ct Chest W Contrast  Result Date: 10/27/2018 CLINICAL DATA:  Right back pain EXAM: CT CHEST, ABDOMEN, AND PELVIS WITH CONTRAST TECHNIQUE: Multidetector CT imaging of the chest, abdomen and pelvis was performed following the standard protocol during bolus administration of intravenous contrast. CONTRAST:  161mL OMNIPAQUE IOHEXOL 300 MG/ML SOLN, additional oral enteric contrast COMPARISON:  CT neck, 10/27/2018, CT abdomen pelvis,  04/18/2018 02/05/2018 FINDINGS: CT CHEST FINDINGS Cardiovascular: Coronary artery calcifications. Normal heart size. No pericardial effusion. Mediastinum/Nodes: Very bulky left cervical and supraclavicular lymphadenopathy, better appreciated by same-day CT examination of the neck. There are prominent bilateral axillary and mediastinal lymph nodes, largest pretracheal nodes measuring 1.7 cm in short axis (series 3, image 25). Thyroid gland, trachea, and esophagus demonstrate no significant findings. Lungs/Pleura: Bibasilar scarring or atelectasis. Unchanged 6 mm pulmonary nodule of the left lung base (series 4, image 99). No pleural effusion or pneumothorax. Musculoskeletal: No chest wall mass or suspicious bone lesions identified. CT ABDOMEN PELVIS FINDINGS Hepatobiliary: No solid liver abnormality is seen. Numerous redemonstrated low-attenuation lesions of the liver, likely simple cysts or hemangiomata. No gallstones, gallbladder wall thickening, or biliary dilatation. Pancreas: Unremarkable. No pancreatic ductal dilatation or surrounding inflammatory changes. Spleen: Normal in size without significant abnormality. Adrenals/Urinary Tract: Unchanged non nodular thickening of the adrenal glands. The left kidney is grossly deformed by extremely bulky exophytic cysts, largest measuring at least 15.3 cm. There are smaller cysts of the right kidney. Bladder is unremarkable. Stomach/Bowel: Stomach is within normal limits. Appendix appears normal. No evidence of bowel wall thickening, distention, or inflammatory changes. Vascular/Lymphatic: Scattered calcific atherosclerosis. Numerous prominent gastrohepatic, retroperitoneal and mesenteric lymph nodes, increased in size compared to prior CT, largest mesenteric node measuring 2.8 cm in short axis (series 3, image 93). Reproductive: No mass or other abnormality. Other: No abdominal wall hernia or abnormality. No abdominopelvic ascites. Musculoskeletal: Postoperative findings  of lumbar laminectomy and fusion of L3 through S1 and the sacroiliac joints. IMPRESSION: 1. No acute CT findings of the abdomen or pelvis to explain right back pain. No evidence of urinary tract calculus or hydronephrosis. Normal appendix. Postoperative findings of lumbosacral fusion. 2. Very bulky left cervical and supraclavicular lymphadenopathy, better appreciated by same-day CT examination of the neck. There are prominent bilateral axillary and mediastinal lymph nodes, largest pretracheal nodes measuring 1.7 cm in short axis (series 3, image 25). Numerous prominent gastrohepatic, retroperitoneal and mesenteric lymph nodes, increased in size compared to prior CT, largest mesenteric node measuring 2.8 cm in short axis (series 3, image 93). Constellation of findings is concerning for lymphoma. 3. Unchanged 6 mm pulmonary nodule of the left lung base (series 4, image 99). Attention on follow-up. 4. The left kidney is again grossly deformed by extremely bulky exophytic cysts, largest measuring at least 15.3 cm. Cysts may be symptomatic given size. Electronically Signed   By: Eddie Candle M.D.   On: 10/27/2018 20:32   Ct Abdomen Pelvis W Contrast  Result Date: 10/27/2018 CLINICAL DATA:  Right back pain EXAM: CT CHEST, ABDOMEN, AND PELVIS WITH CONTRAST TECHNIQUE: Multidetector CT imaging of the chest, abdomen  and pelvis was performed following the standard protocol during bolus administration of intravenous contrast. CONTRAST:  156mL OMNIPAQUE IOHEXOL 300 MG/ML SOLN, additional oral enteric contrast COMPARISON:  CT neck, 10/27/2018, CT abdomen pelvis, 04/18/2018 02/05/2018 FINDINGS: CT CHEST FINDINGS Cardiovascular: Coronary artery calcifications. Normal heart size. No pericardial effusion. Mediastinum/Nodes: Very bulky left cervical and supraclavicular lymphadenopathy, better appreciated by same-day CT examination of the neck. There are prominent bilateral axillary and mediastinal lymph nodes, largest pretracheal  nodes measuring 1.7 cm in short axis (series 3, image 25). Thyroid gland, trachea, and esophagus demonstrate no significant findings. Lungs/Pleura: Bibasilar scarring or atelectasis. Unchanged 6 mm pulmonary nodule of the left lung base (series 4, image 99). No pleural effusion or pneumothorax. Musculoskeletal: No chest wall mass or suspicious bone lesions identified. CT ABDOMEN PELVIS FINDINGS Hepatobiliary: No solid liver abnormality is seen. Numerous redemonstrated low-attenuation lesions of the liver, likely simple cysts or hemangiomata. No gallstones, gallbladder wall thickening, or biliary dilatation. Pancreas: Unremarkable. No pancreatic ductal dilatation or surrounding inflammatory changes. Spleen: Normal in size without significant abnormality. Adrenals/Urinary Tract: Unchanged non nodular thickening of the adrenal glands. The left kidney is grossly deformed by extremely bulky exophytic cysts, largest measuring at least 15.3 cm. There are smaller cysts of the right kidney. Bladder is unremarkable. Stomach/Bowel: Stomach is within normal limits. Appendix appears normal. No evidence of bowel wall thickening, distention, or inflammatory changes. Vascular/Lymphatic: Scattered calcific atherosclerosis. Numerous prominent gastrohepatic, retroperitoneal and mesenteric lymph nodes, increased in size compared to prior CT, largest mesenteric node measuring 2.8 cm in short axis (series 3, image 93). Reproductive: No mass or other abnormality. Other: No abdominal wall hernia or abnormality. No abdominopelvic ascites. Musculoskeletal: Postoperative findings of lumbar laminectomy and fusion of L3 through S1 and the sacroiliac joints. IMPRESSION: 1. No acute CT findings of the abdomen or pelvis to explain right back pain. No evidence of urinary tract calculus or hydronephrosis. Normal appendix. Postoperative findings of lumbosacral fusion. 2. Very bulky left cervical and supraclavicular lymphadenopathy, better appreciated  by same-day CT examination of the neck. There are prominent bilateral axillary and mediastinal lymph nodes, largest pretracheal nodes measuring 1.7 cm in short axis (series 3, image 25). Numerous prominent gastrohepatic, retroperitoneal and mesenteric lymph nodes, increased in size compared to prior CT, largest mesenteric node measuring 2.8 cm in short axis (series 3, image 93). Constellation of findings is concerning for lymphoma. 3. Unchanged 6 mm pulmonary nodule of the left lung base (series 4, image 99). Attention on follow-up. 4. The left kidney is again grossly deformed by extremely bulky exophytic cysts, largest measuring at least 15.3 cm. Cysts may be symptomatic given size. Electronically Signed   By: Eddie Candle M.D.   On: 10/27/2018 20:32   Dg Chest Port 1 View  Result Date: 10/27/2018 CLINICAL DATA:  73 year old male with fever. EXAM: PORTABLE CHEST 1 VIEW COMPARISON:  Chest radiograph dated 05/18/2018 FINDINGS: The heart size and mediastinal contours are within normal limits. Both lungs are clear. The visualized skeletal structures are unremarkable. IMPRESSION: No active disease. Electronically Signed   By: Anner Crete M.D.   On: 10/27/2018 00:28   [place] Core Biopsy (lymph Nodes)  Result Date: 10/27/2018 INDICATION: 73 year old male with massive left cervical and supraclavicular lymphadenopathy EXAM: ULTRASOUND GUIDED core BIOPSY OF left neck lymphadenopathy MEDICATIONS: None. ANESTHESIA/SEDATION: None PROCEDURE: The procedure, risks, benefits, and alternatives were explained to the patient. Questions regarding the procedure were encouraged and answered. The patient understands and consents to the procedure. The left neck was prepped  with chlorhexidine in a sterile fashion, and a sterile drape was applied covering the operative field. A sterile gown and sterile gloves were used for the procedure. Local anesthesia was provided with 1% Lidocaine. A small dermatotomy was made. Under real-time  sonographic guidance, multiple 18 gauge core biopsies were obtained of the lymphadenopathy using a bio Pince automated biopsy device. Post biopsy ultrasound imaging demonstrates no evidence of immediate complication. The patient tolerated the procedure well. COMPLICATIONS: None immediate. FINDINGS: Technical success without evidence of complication. IMPRESSION: Successful ultrasound-guided core biopsy of left cervical lymphadenopathy. Electronically Signed   By: Jacqulynn Cadet M.D.   On: 10/27/2018 17:19   Vas [place] Lower Extremity Venous (dvt)  Result Date: 10/27/2018  Lower Venous Study Indications: Edema.  Limitations: Body habitus and poor ultrasound/tissue interface. Performing Technologist: Abram Sander RVS  Examination Guidelines: A complete evaluation includes B-mode imaging, spectral Doppler, color Doppler, and power Doppler as needed of all accessible portions of each vessel. Bilateral testing is considered an integral part of a complete examination. Limited examinations for reoccurring indications may be performed as noted.  +---------+---------------+---------+-----------+----------+------------------+  LEFT      Compressibility Phasicity Spontaneity Properties Summary             +---------+---------------+---------+-----------+----------+------------------+  CFV       Full            Yes       Yes                                        +---------+---------------+---------+-----------+----------+------------------+  SFJ       Full                                                                 +---------+---------------+---------+-----------+----------+------------------+  FV Prox   Full                                                                 +---------+---------------+---------+-----------+----------+------------------+  FV Mid    Full                                                                 +---------+---------------+---------+-----------+----------+------------------+  FV  Distal Full                                                                 +---------+---------------+---------+-----------+----------+------------------+  PFV       Full                                                                 +---------+---------------+---------+-----------+----------+------------------+  POP       Full            Yes       Yes                                        +---------+---------------+---------+-----------+----------+------------------+  PTV       Full                                             limited                                                                         visualization       +---------+---------------+---------+-----------+----------+------------------+  PERO      Full                                             limited                                                                         visualization       +---------+---------------+---------+-----------+----------+------------------+     Summary: Left: There is no evidence of deep vein thrombosis in the lower extremity. However, portions of this examination were limited- see technologist comments above. No cystic structure found in the popliteal fossa.  *See table(s) above for measurements and observations. Electronically signed by Servando Snare MD on 10/27/2018 at 12:24:25 PM.    Final     ____________________________________________   PROCEDURES  Procedure(s) performed:   Procedures  CRITICAL CARE Performed by: Merrily Pew Total critical care time: 35 minutes Critical care time was exclusive of separately billable procedures and treating other patients. Critical care was necessary to treat or prevent imminent or life-threatening deterioration. Critical care was time spent personally by me on the following activities: development of treatment plan with patient and/or surrogate as well as nursing, discussions with consultants, evaluation of patient's response to treatment, examination of  patient, obtaining history from patient or surrogate, ordering and performing treatments and interventions, ordering and review of laboratory studies, ordering and review of radiographic studies, pulse oximetry and re-evaluation of patient's condition.  ____________________________________________   INITIAL IMPRESSION / ASSESSMENT AND PLAN / ED COURSE  Patient is now febrile.  This is new from his doctor's visit this morning and from yesterday.  Slightly elevated heart rate and with this red swollen leg and also this tender indurated area of his neck concern for possible infection so sepsis called.  Patient otherwise appears overall well.  Plan for sepsis work-up and after his kidney function back determination whether or not we can use contrast to do CT  scan of his neck and possibly his leg. Will likely need to be admitted 2/2 generalized weakness/confusion/AMS.  CT neck rest of work-up negative.  Suspect he has cellulitis as a cause for his infection at this point versus inflammatory response secondary to this necrotic lymph node in his neck possibly related to lymphoma.  Admit to hospitalist for tx of cellulitis and possibly further lymphoma workup  Pertinent labs & imaging results that were available during my care of the patient were reviewed by me and considered in my medical decision making (see chart for details).  A medical screening exam was performed and I feel the patient has had an appropriate workup for their chief complaint at this time and likelihood of emergent condition existing is low. They have been counseled on decision, discharge, follow up and which symptoms necessitate immediate return to the emergency department. They or their family verbally stated understanding and agreement with plan and discharged in stable condition.   ____________________________________________  FINAL CLINICAL IMPRESSION(S) / ED DIAGNOSES  Final diagnoses:  None     MEDICATIONS GIVEN DURING  THIS VISIT:  Medications  lactated ringers bolus 1,000 mL (has no administration in time range)  vancomycin (VANCOCIN) 2,000 mg in sodium chloride 0.9 % 500 mL IVPB (has no administration in time range)  ceFEPIme (MAXIPIME) 2 g in sodium chloride 0.9 % 100 mL IVPB (has no administration in time range)     NEW OUTPATIENT MEDICATIONS STARTED DURING THIS VISIT:  New Prescriptions   No medications on file    Note:  This note was prepared with assistance of Dragon voice recognition software. Occasional wrong-word or sound-a-like substitutions may have occurred due to the inherent limitations of voice recognition software.   Merrily Pew, MD 10/27/18 2320

## 2018-10-27 NOTE — Progress Notes (Signed)
Lower extremity venous has been completed.   Preliminary results in CV Proc.   Abram Sander 10/27/2018 9:57 AM

## 2018-10-27 NOTE — H&P (Signed)
History and Physical    Ronald Rice RCB:[id number] DOB: November 15, 1945 DOA: 10/26/2018  PCP: Deland Pretty, MD  Patient coming from: Home.  Chief Complaint: Neck mass.  HPI: Ronald Rice is a 73 y.o. male with history of diabetes mellitus on diet, hypertension, CAD, history of CLL in remission was treated in [place] in 2015 follows with Dr. Burney Gauze presents to the ER for the second time in last 24 hours because of worsening swelling in the neck on the left side.  Also noticed some swelling in his left leg out he had a fall 2 days ago.  Patient states his left neck swelling was started to be noticed 4 days ago which is gradually progressed I come to the ER day ago and at the time was advised to follow-up with primary care physician for further work-up.  The swelling is worsened as per the patient but denies any difficulty swallowing or speaking.  Denies any shortness of the cough.  Has been exam swelling and tenderness of the left leg after a fall.  ED Course: In the ER patient was febrile with temperature 101.5 degree tachycardic and COVID-19 was negative.  Chest x-ray unremarkable.  CT of the neck shows features concerning for lymphoma with markedly enlarged cervical and supraclavicular lymph nodes.  X-ray of the left leg shows soft tissue status concerning for cellulitis.  Patient had blood cultures drawn and plan placed on empiric antibiotics.  Review of Systems: As per HPI, rest all negative.   Past Medical History:  Diagnosis Date   Cancer ([place])    Leukemia    CLL (chronic lymphocytic leukemia) ([place])    see records in care everywhere from [facility]   Coronary artery disease    minimal nonobstructive by 10/31/16 cath at [facility] in [place]   Diabetes mellitus without complication ([place])    GERD (gastroesophageal reflux disease)    Hypertension    Sleep apnea    does not wear CPAP   Wears dentures    Wears glasses     Past Surgical History:  Procedure Laterality Date     ANTERIOR CERVICAL DECOMP/DISCECTOMY FUSION N/A 04/20/2017   Procedure: ANTERIOR CERVICAL DECOMPRESSION FUSION, CERVICAL 3-4, CERVICAL 4-5 WITH INSTRUMENTATION AND ALLOGRAFT; REQUEST 3.5 HOURS;  Surgeon: Phylliss Bob, MD;  Location: [facility];  Service: Orthopedics;  Laterality: N/A;  ANTERIOR CERVICAL DECOMPRESSION FUSION, CERVICAL 3-4, CERVICAL 4-5 WITH INSTRUMENTATION AND ALLOGRAFT; REQUEST 3.5 HOURS   BACK SURGERY     CARDIAC CATHETERIZATION     in Care Everywhere 11/01/16   COLONOSCOPY     MULTIPLE TOOTH EXTRACTIONS     ORIF ANKLE FRACTURE Left 09/13/2018   Procedure: OPEN REDUCTION INTERNAL FIXATION (ORIF) LATERAL LEFT ANKLE FRACTURE;  Surgeon: Dorna Leitz, MD;  Location: [facility];  Service: Orthopedics;  Laterality: Left;   SYNDESMOSIS REPAIR Left 09/13/2018   Procedure: OPEN REDUCTION INTERNAL FIXATION SYNDESMOSIS REPAIR LEFT ANKLE;  Surgeon: Dorna Leitz, MD;  Location: [facility];  Service: Orthopedics;  Laterality: Left;     reports that he has quit smoking. His smoking use included pipe, cigars, and cigarettes. He has never used smokeless tobacco. He reports current alcohol use. He reports that he does not use drugs.  No Known Allergies  Family History  Problem Relation Age of Onset   Heart attack Mother    Diabetes Sister    Diabetes Brother     Prior to Admission medications   Medication Sig Start Date End Date Taking? Authorizing Provider  acetaminophen (TYLENOL) 500  MG tablet Take 500 mg by mouth every 6 (six) hours as needed (pain).    [provider]  allopurinol (ZYLOPRIM) 300 MG tablet Take 300 mg by mouth daily.    [provider]  aspirin EC 325 MG tablet Take 1 tablet (325 mg total) by mouth daily. Take with food. Take x 1 month post op to decrease risk of blood clots. 09/13/18   Gary Fleet, PA-C  atenolol (TENORMIN) 25 MG tablet Take 25 mg by mouth daily.     [provider]  atorvastatin (LIPITOR) 40 MG tablet Take 40 mg by mouth  daily.    [provider]  CIALIS 20 MG tablet Take 20 mg by mouth as needed for erectile dysfunction. 10/06/17   [provider]  Cinnamon 500 MG TABS Take 500 mg by mouth daily.    [provider]  famotidine (PEPCID) 20 MG tablet Take 20 mg by mouth at bedtime as needed for heartburn.     [provider]  HYDROcodone-acetaminophen (NORCO/VICODIN) 5-325 MG tablet Take 1-2 tablets by mouth every 6 (six) hours as needed for moderate pain. 09/13/18   Gary Fleet, PA-C  ibuprofen (ADVIL) 800 MG tablet Take 800 mg by mouth every 8 (eight) hours as needed (pain).    [provider]  ketoconazole (NIZORAL) 2 % cream Apply 1 application topically as needed for irritation. 11/02/17   [provider]  lisinopril-hydrochlorothiazide (PRINZIDE,ZESTORETIC) 20-25 MG tablet Take 2 tablets by mouth daily.     [provider]  Multiple Vitamins-Minerals (CENTRUM SILVER 50+MEN PO) Take 1 tablet by mouth daily.    [provider]  NON FORMULARY Take 2 tablets by mouth daily. Blood Sugar Defense     [provider]  potassium chloride (K-DUR) 10 MEQ tablet Take 10 mEq by mouth daily.     [provider]  tiZANidine (ZANAFLEX) 4 MG tablet Take 4 mg by mouth every 12 (twelve) hours as needed for muscle spasms. 11/02/17   [provider]    Physical Exam: Vitals:   10/27/18 0000 10/27/18 0100 10/27/18 0130 10/27/18 0259  BP: (!) 142/97 (!) 147/96 (!) 158/93   Pulse:  97    Resp: (!) 23 17 19    Temp:    100.3 F (37.9 C)  TempSrc:    Rectal  SpO2:  98%    Weight:      Height:          Constitutional: Moderately built and nourished. Vitals:   10/27/18 0000 10/27/18 0100 10/27/18 0130 10/27/18 0259  BP: (!) 142/97 (!) 147/96 (!) 158/93   Pulse:  97    Resp: (!) 23 17 19    Temp:    100.3 F (37.9 C)  TempSrc:    Rectal  SpO2:  98%    Weight:      Height:       Eyes: Anicteric no pallor. ENMT: No  discharge from the ears eyes nose and mouth. Neck: Swollen cervical and supraclavicular lymph nodes. Respiratory: No rhonchi or crepitations. Cardiovascular: S1-S2 heard. Abdomen: Soft nontender bowel sounds present. Musculoskeletal: Left leg is edema and mild tenderness. Skin: Erythema of the left leg. Neurologic: Alert awake oriented to time place and person.  Moves all extremities. Psychiatric: Appears normal per normal affect.   Labs on Admission: I have personally reviewed following labs and imaging studies  CBC: Recent Labs  Lab 10/26/18 2345  WBC 11.5*  NEUTROABS 5.0  HGB 14.7  HCT 40.5  MCV 86.7  PLT 716   Basic Metabolic Panel: Recent Labs  Lab 10/26/18 2345  NA 136  K 3.7  CL 98  CO2 27  GLUCOSE 177*  BUN 8  CREATININE 1.18  CALCIUM 9.5   GFR: Estimated Creatinine Clearance: 83.1 mL/min (by C-G formula based on SCr of 1.18 mg/dL). Liver Function Tests: Recent Labs  Lab 10/26/18 2345  AST 21  ALT 23  ALKPHOS 71  BILITOT 1.3*  PROT 7.1  ALBUMIN 4.4   No results for input(s): LIPASE, AMYLASE in the last 168 hours. No results for input(s): AMMONIA in the last 168 hours. Coagulation Profile: No results for input(s): INR, PROTIME in the last 168 hours. Cardiac Enzymes: No results for input(s): CKTOTAL, CKMB, CKMBINDEX, TROPONINI in the last 168 hours. BNP (last 3 results) No results for input(s): PROBNP in the last 8760 hours. HbA1C: No results for input(s): HGBA1C in the last 72 hours. CBG: No results for input(s): GLUCAP in the last 168 hours. Lipid Profile: No results for input(s): CHOL, HDL, LDLCALC, TRIG, CHOLHDL, LDLDIRECT in the last 72 hours. Thyroid Function Tests: No results for input(s): TSH, T4TOTAL, FREET4, T3FREE, THYROIDAB in the last 72 hours. Anemia Panel: No results for input(s): VITAMINB12, FOLATE, FERRITIN, TIBC, IRON, RETICCTPCT in the last 72 hours. Urine analysis:    Component Value Date/Time   COLORURINE YELLOW  10/27/2018 0210   APPEARANCEUR CLEAR 10/27/2018 0210   LABSPEC 1.016 10/27/2018 0210   PHURINE 7.0 10/27/2018 0210   GLUCOSEU NEGATIVE 10/27/2018 0210   HGBUR NEGATIVE 10/27/2018 0210   BILIRUBINUR NEGATIVE 10/27/2018 0210   KETONESUR NEGATIVE 10/27/2018 0210   PROTEINUR NEGATIVE 10/27/2018 0210   NITRITE NEGATIVE 10/27/2018 0210   LEUKOCYTESUR NEGATIVE 10/27/2018 0210   Sepsis Labs: @LABRCNTIP (procalcitonin:4,lacticidven:4) ) Recent Results (from the past 240 hour(s))  SARS Coronavirus 2 (CEPHEID - Performed in [place] hospital lab), Hosp Order     Status: None   Collection Time: 10/27/18 12:32 AM  Result Value Ref Range Status   SARS Coronavirus 2 NEGATIVE NEGATIVE Final    Comment: (NOTE) If result is NEGATIVE SARS-CoV-2 target nucleic acids are NOT DETECTED. The SARS-CoV-2 RNA is generally detectable in upper and lower  respiratory specimens during the acute phase of infection. The lowest  concentration of SARS-CoV-2 viral copies this assay can detect is 250  copies / mL. A negative result does not preclude SARS-CoV-2 infection  and should not be used as the sole basis for treatment or other  patient management decisions.  A negative result may occur with  improper specimen collection / handling, submission of specimen other  than nasopharyngeal swab, presence of viral mutation(s) within the  areas targeted by this assay, and inadequate number of viral copies  (<250 copies / mL). A negative result must be combined with clinical  observations, patient history, and epidemiological information. If result is POSITIVE SARS-CoV-2 target nucleic acids are DETECTED. The SARS-CoV-2 RNA is generally detectable in upper and lower  respiratory specimens dur ing the acute phase of infection.  Positive  results are indicative of active infection with SARS-CoV-2.  Clinical  correlation with patient history and other diagnostic information is  necessary to determine patient infection  status.  Positive results do  not rule out bacterial infection or co-infection with other viruses. If result is PRESUMPTIVE POSTIVE SARS-CoV-2 nucleic acids MAY BE PRESENT.   A presumptive positive result was obtained on the submitted specimen  and confirmed on repeat testing.  While 2019 novel coronavirus  (  SARS-CoV-2) nucleic acids may be present in the submitted sample  additional confirmatory testing may be necessary for epidemiological  and / or clinical management purposes  to differentiate between  SARS-CoV-2 and other Sarbecovirus currently known to infect humans.  If clinically indicated additional testing with an alternate test  methodology [phone]) is advised. The SARS-CoV-2 RNA is generally  detectable in upper and lower respiratory sp ecimens during the acute  phase of infection. The expected result is Negative. Fact Sheet for Patients:  StrictlyIdeas.no Fact Sheet for Healthcare Providers: BankingDealers.co.za This test is not yet approved or cleared by the [place] FDA and has been authorized for detection and/or diagnosis of SARS-CoV-2 by FDA under an Emergency Use Authorization (EUA).  This EUA will remain in effect (meaning this test can be used) for the duration of the COVID-19 declaration under Section 564(b)(1) of the Act, 21 U.S.C. section 360bbb-3(b)(1), unless the authorization is terminated or revoked sooner. Performed at [facility], [place] [address], [place], [place] [address]      Radiological Exams on Admission: Dg Tibia/fibula Left  Result Date: 10/27/2018 CLINICAL DATA:  73 year old male with left leg swelling. EXAM: LEFT TIBIA AND FIBULA - 2 VIEW COMPARISON:  Left ankle radiograph dated 09/10/2018 FINDINGS: There is no acute fracture or dislocation. Distal fibular fixation side plate and screw noted. The hardware appears intact and with good contact with the bone. The ankle mortise is intact.  Tiny corticated bone fragment anterior to the tibial plafond and likely chronic. There is diffuse subcutaneous soft tissue edema which may represent cellulitis. Clinical correlation is recommended. No soft tissue gas. IMPRESSION: 1. Diffuse subcutaneous edema, likely cellulitis. 2. No acute fracture or dislocation. 3. Hardware intact. Electronically Signed   By: Anner Crete M.D.   On: 10/27/2018 00:30   Ct Soft Tissue Neck W Contrast  Result Date: 10/27/2018 CLINICAL DATA:  Neck mass.  History of malignancy. EXAM: CT NECK WITH CONTRAST TECHNIQUE: Multidetector CT imaging of the neck was performed using the standard protocol following the bolus administration of intravenous contrast. CONTRAST:  61mL OMNIPAQUE IOHEXOL 300 MG/ML  SOLN COMPARISON:  CTA neck 11/22/2017 FINDINGS: PHARYNX AND LARYNX: --Nasopharynx: Fossae of Rosenmuller are clear. Normal adenoid tonsils for age. --Oral cavity and oropharynx: The palatine and lingual tonsils are normal. The visible oral cavity and floor of mouth are normal. --Hypopharynx: The left piriform sinus is effaced in the hypopharynx is markedly rightward deviated. --Larynx: Normal epiglottis and pre-epiglottic space. Normal aryepiglottic and vocal folds. --Retropharyngeal space: There is a small retropharyngeal effusion measuring approximately 7 mm in thickness. SALIVARY GLANDS: --Parotid: No mass lesion or inflammation. No sialolithiasis or ductal dilatation. --Submandibular: Symmetric without inflammation. No sialolithiasis or ductal dilatation. --Sublingual: Normal. No ranula or other visible lesion of the base of tongue and floor of mouth. THYROID: 10 mm right thyroid nodule. LYMPH NODES: There are numerous enlarged lymph nodes throughout the neck. At left level 2 A, there is a necrotic node that measures 4.6 x 5.5 cm. Left level 5 A node measures 19 mm. Left level 5 B node measures 21 mm. There are numerous right level 2 and 3 lymph nodes, predominantly subcentimeter.  There are multiple enlarged left supraclavicular nodes. VASCULAR: Major cervical vessels are patent. LIMITED INTRACRANIAL: Normal. VISUALIZED ORBITS: Normal. MASTOIDS AND VISUALIZED PARANASAL SINUSES: No fluid levels or advanced mucosal thickening. No mastoid effusion. SKELETON: C3-5 ACDF UPPER CHEST: Clear. OTHER: None. IMPRESSION: 1. Severe left-sided cervical and supraclavicular lymphadenopathy, including necrotic left level 2A  node that measures 4.6 x 5.5 cm. There also numerous right-sided cervical lymph nodes, which are predominantly subcentimeter. Findings are most suggestive of lymphoma. 2. Small retropharyngeal effusion, likely reactive. Electronically Signed   By: Ulyses Jarred M.D.   On: 10/27/2018 02:19   Dg Chest Port 1 View  Result Date: 10/27/2018 CLINICAL DATA:  73 year old male with fever. EXAM: PORTABLE CHEST 1 VIEW COMPARISON:  Chest radiograph dated 05/18/2018 FINDINGS: The heart size and mediastinal contours are within normal limits. Both lungs are clear. The visualized skeletal structures are unremarkable. IMPRESSION: No active disease. Electronically Signed   By: Anner Crete M.D.   On: 10/27/2018 00:28    EKG: Independently reviewed.  Normal sinus rhythm.  Assessment/Plan Principal Problem:   Sepsis ([place]) Active Problems:   Chronic lymphoid leukemia ([facility])   CAD (coronary artery disease)   Type 2 diabetes mellitus without complications ([place])   Essential (primary) hypertension   Cellulitis of left leg    1. Possible early sepsis from cellulitis of the left lower extremity could also be having SIRS from possible lymphoma involving the left cervical and supraclavicular area.  On empiric antibiotics follow cultures.  Continue hydration.  Will check Dopplers of the left lower extremity. 2. Cervical and left supraclavicular lymphadenopathy concerning for lymphoma.  Please consult with Dr. Burney Gauze in the morning.  Patient has previously history of CLL and was followed by  Dr. Marin Olp oncologist. 3. Hypertension on atenolol lisinopril.  Will hold hydrochlorothiazide since patient is receiving fluids.  PRN IV hydralazine. 4. Gout on allopurinol. 5. Diabetes mellitus type 2 on diet and sliding scale coverage. 6. Previous history of CLL.   DVT prophylaxis: SCDs in anticipation of procedure for the lymph node. Code Status: Full code. Family Communication: Discussed with patient. Disposition Plan: Home. Consults called: None. Admission status: Observation.   Rise Patience MD [facility] Pager [phone].  If 7PM-7AM, please contact night-coverage [email or web address] Password [facility]  10/27/2018, 5:36 AM

## 2018-10-27 NOTE — Progress Notes (Addendum)
Pharmacy Antibiotic Note  Ronald Rice is a 73 y.o. male admitted on 10/26/2018 with possible LLE cellulitis.  Pharmacy has been consulted for Vancomycin and Cefepime dosing.  Vancomycin 2 g IV given in ED at [place]: Vancomycin 1250 mg IV q12h Cefepime 2 g IV q8h  Addendum: Will adjust the Vancomycin dose slightly to 1g/12h for est AUC of 503 using SCr 1.18, Vd 0.5.   Ronald Rice, PharmD, BCPS Pager: [phone] 9:32 AM    Height: 6\' 2"  (188 cm) Weight: 284 lb (128.8 kg) IBW/kg (Calculated) : 82.2  Temp (24hrs), Avg:100.9 F (38.3 C), Min:100.3 F (37.9 C), Max:101.5 F (38.6 C)  Recent Labs  Lab 10/26/18 2345  WBC 11.5*  CREATININE 1.18  LATICACIDVEN 1.3    Estimated Creatinine Clearance: 83.1 mL/min (by C-G formula based on SCr of 1.18 mg/dL).    No Known Allergies  Ronald Rice 10/27/2018 7:31 AM

## 2018-10-27 NOTE — Progress Notes (Signed)
Pt received to 5W rm 02. Pt Oriented to unit and how to call for assistance. Fall education completed . Pt verbalized understanding risks associated with falls. Pt with cellulitis to LLE and skin tags to upper Torso otherwise intact skin

## 2018-10-27 NOTE — Procedures (Signed)
Interventional Radiology Procedure Note  Procedure: [place] core biopsy of left neck lymphadenopathy  Complications: None  Estimated Blood Loss: None  Recommendations: - Path sent - Bedrest x 1 hr  Signed,  Criselda Peaches, MD

## 2018-10-27 NOTE — Progress Notes (Signed)
PROGRESS NOTE    Ronald Rice  MHD:[id number] DOB: Aug 04, 1945 DOA: 10/26/2018 PCP: Deland Pretty, MD    Brief Narrative:  73 y.o. male with history of diabetes mellitus on diet, hypertension, CAD, history of CLL in remission was treated in [place] in 2015 follows with Dr. Burney Gauze presents to the ER for the second time in last 24 hours because of worsening swelling in the neck on the left side.  Also noticed some swelling in his left leg out he had a fall 2 days ago.  Patient states his left neck swelling was started to be noticed 4 days ago which is gradually progressed I come to the ER day ago and at the time was advised to follow-up with primary care physician for further work-up.  The swelling is worsened as per the patient but denies any difficulty swallowing or speaking.  Denies any shortness of the cough.  Has been exam swelling and tenderness of the left leg after a fall.  ED Course: In the ER patient was febrile with temperature 101.5 degree tachycardic and COVID-19 was negative.  Chest x-ray unremarkable.  CT of the neck shows features concerning for lymphoma with markedly enlarged cervical and supraclavicular lymph nodes.  X-ray of the left leg shows soft tissue status concerning for cellulitis.  Patient had blood cultures drawn and plan placed on empiric antibiotics.  Assessment & Plan:   Principal Problem:   Sepsis ([place]) Active Problems:   Chronic lymphoid leukemia ([facility])   CAD (coronary artery disease)   Type 2 diabetes mellitus without complications ([place])   Essential (primary) hypertension   Cellulitis of left leg  1. Fevers present on admit 1. Presently hemodynamically stable 2. Sepsis ruled out 3. Fevers likely secondary to lymphoma 2. Cervical and left supraclavicular lymphadenopathy concerning for lymphoma 1. Patient known to Dr. Marin Olp, last seen one year ago 2. Patient notes worsening L sided neck swelling over past 4 days prior to admit 3. CT neck  personally reviewed. Findings worrisome for lymphoma 4. Will consult Oncology for further recommendations 3. Hypertension on atenolol lisinopril. 1. BP stable at this time 2. Diuretics on hold as pt was given IVF overnight 4. Gout  1. Continued on allopurinol 2. Stable at present 5. Diabetes mellitus type 2 1. Continue sliding scale coverage. 2. Glucose trends stable at this time 6. Previous history of CLL. 1. Pt known to Oncology, last seen one year prior and originally had scheduled f/u last month. Pt was lost to f/u at that time 2. Have consulted Dr. Marin Olp who requested biopsy of cervical node. Also recommendation for CT chest, abd/pelvis for further eval. Will order  DVT prophylaxis: SCD's Code Status: Full Family Communication: Pt in room, family not at bedside Disposition Plan: Uncertain at this time  Consultants:   Oncology  Procedures:     Antimicrobials: Anti-infectives (From admission, onward)   Start     Dose/Rate Route Frequency Ordered Stop   10/27/18 1800  vancomycin (VANCOCIN) 1,250 mg in sodium chloride 0.9 % 250 mL IVPB  Status:  Discontinued     1,250 mg 166.7 mL/hr over 90 Minutes Intravenous Every 12 hours 10/27/18 0734 10/27/18 0931   10/27/18 1800  vancomycin (VANCOCIN) IVPB 1000 mg/200 mL premix     1,000 mg 200 mL/hr over 60 Minutes Intravenous Every 12 hours 10/27/18 0931     10/27/18 1200  ceFEPIme (MAXIPIME) 2 g in sodium chloride 0.9 % 100 mL IVPB     2 g 200 mL/hr over  30 Minutes Intravenous Every 8 hours 10/27/18 0734     10/27/18 0000  vancomycin (VANCOCIN) 2,000 mg in sodium chloride 0.9 % 500 mL IVPB     2,000 mg 250 mL/hr over 120 Minutes Intravenous  Once 10/26/18 2346 10/27/18 0412   10/27/18 0000  ceFEPIme (MAXIPIME) 2 g in sodium chloride 0.9 % 100 mL IVPB     2 g 200 mL/hr over 30 Minutes Intravenous  Once 10/26/18 2346 10/27/18 0059       Subjective: Complaining of continued L neck swelling, discomfort  Objective: Vitals:    10/27/18 0130 10/27/18 0259 10/27/18 0640 10/27/18 0907  BP: (!) 158/93   (!) 143/91  Pulse:    (!) 101  Resp: 19     Temp:  100.3 F (37.9 C)  98.2 F (36.8 C)  TempSrc:  Rectal  Oral  SpO2:    93%  Weight:   131.1 kg   Height:   6\' 2"  (1.88 m)     Intake/Output Summary (Last 24 hours) at 10/27/2018 1358 Last data filed at 10/27/2018 0600 Gross per 24 hour  Intake 1733.38 ml  Output --  Net 1733.38 ml   Filed Weights   10/26/18 2333 10/27/18 0640  Weight: 128.8 kg 131.1 kg    Examination:  General exam: Appears calm and comfortable, L neck swelling Respiratory system: Clear to auscultation. Respiratory effort normal. Cardiovascular system: S1 & S2 heard, RRR Gastrointestinal system: Abdomen is nondistended, soft and nontender. No organomegaly or masses felt. Normal bowel sounds heard. Central nervous system: Alert and oriented. No focal neurological deficits. Extremities: Symmetric 5 x 5 power. Skin: No rashes, lesions Psychiatry: Judgement and insight appear normal. Mood & affect appropriate.   Data Reviewed: I have personally reviewed following labs and imaging studies  CBC: Recent Labs  Lab 10/26/18 2345  WBC 11.5*  NEUTROABS 5.0  HGB 14.7  HCT 40.5  MCV 86.7  PLT 712   Basic Metabolic Panel: Recent Labs  Lab 10/26/18 2345  NA 136  K 3.7  CL 98  CO2 27  GLUCOSE 177*  BUN 8  CREATININE 1.18  CALCIUM 9.5   GFR: Estimated Creatinine Clearance: 83.9 mL/min (by C-G formula based on SCr of 1.18 mg/dL). Liver Function Tests: Recent Labs  Lab 10/26/18 2345  AST 21  ALT 23  ALKPHOS 71  BILITOT 1.3*  PROT 7.1  ALBUMIN 4.4   No results for input(s): LIPASE, AMYLASE in the last 168 hours. No results for input(s): AMMONIA in the last 168 hours. Coagulation Profile: No results for input(s): INR, PROTIME in the last 168 hours. Cardiac Enzymes: No results for input(s): CKTOTAL, CKMB, CKMBINDEX, TROPONINI in the last 168 hours. BNP (last 3  results) No results for input(s): PROBNP in the last 8760 hours. HbA1C: No results for input(s): HGBA1C in the last 72 hours. CBG: Recent Labs  Lab 10/27/18 0848 10/27/18 1138  GLUCAP 148* 153*   Lipid Profile: No results for input(s): CHOL, HDL, LDLCALC, TRIG, CHOLHDL, LDLDIRECT in the last 72 hours. Thyroid Function Tests: No results for input(s): TSH, T4TOTAL, FREET4, T3FREE, THYROIDAB in the last 72 hours. Anemia Panel: No results for input(s): VITAMINB12, FOLATE, FERRITIN, TIBC, IRON, RETICCTPCT in the last 72 hours. Sepsis Labs: Recent Labs  Lab 10/26/18 2345  LATICACIDVEN 1.3    Recent Results (from the past 240 hour(s))  SARS Coronavirus 2 (CEPHEID - Performed in [facility] hospital lab), Hosp Order     Status: None   Collection Time: 10/27/18  12:32 AM  Result Value Ref Range Status   SARS Coronavirus 2 NEGATIVE NEGATIVE Final    Comment: (NOTE) If result is NEGATIVE SARS-CoV-2 target nucleic acids are NOT DETECTED. The SARS-CoV-2 RNA is generally detectable in upper and lower  respiratory specimens during the acute phase of infection. The lowest  concentration of SARS-CoV-2 viral copies this assay can detect is 250  copies / mL. A negative result does not preclude SARS-CoV-2 infection  and should not be used as the sole basis for treatment or other  patient management decisions.  A negative result may occur with  improper specimen collection / handling, submission of specimen other  than nasopharyngeal swab, presence of viral mutation(s) within the  areas targeted by this assay, and inadequate number of viral copies  (<250 copies / mL). A negative result must be combined with clinical  observations, patient history, and epidemiological information. If result is POSITIVE SARS-CoV-2 target nucleic acids are DETECTED. The SARS-CoV-2 RNA is generally detectable in upper and lower  respiratory specimens dur ing the acute phase of infection.  Positive  results are  indicative of active infection with SARS-CoV-2.  Clinical  correlation with patient history and other diagnostic information is  necessary to determine patient infection status.  Positive results do  not rule out bacterial infection or co-infection with other viruses. If result is PRESUMPTIVE POSTIVE SARS-CoV-2 nucleic acids MAY BE PRESENT.   A presumptive positive result was obtained on the submitted specimen  and confirmed on repeat testing.  While 2019 novel coronavirus  (SARS-CoV-2) nucleic acids may be present in the submitted sample  additional confirmatory testing may be necessary for epidemiological  and / or clinical management purposes  to differentiate between  SARS-CoV-2 and other Sarbecovirus currently known to infect humans.  If clinically indicated additional testing with an alternate test  methodology [phone]) is advised. The SARS-CoV-2 RNA is generally  detectable in upper and lower respiratory sp ecimens during the acute  phase of infection. The expected result is Negative. Fact Sheet for Patients:  StrictlyIdeas.no Fact Sheet for Healthcare Providers: BankingDealers.co.za This test is not yet approved or cleared by the [place] FDA and has been authorized for detection and/or diagnosis of SARS-CoV-2 by FDA under an Emergency Use Authorization (EUA).  This EUA will remain in effect (meaning this test can be used) for the duration of the COVID-19 declaration under Section 564(b)(1) of the Act, 21 U.S.C. section 360bbb-3(b)(1), unless the authorization is terminated or revoked sooner. Performed at [facility], [place] [address], [place], [place] [address]      Radiology Studies: Dg Tibia/fibula Left  Result Date: 10/27/2018 CLINICAL DATA:  73 year old male with left leg swelling. EXAM: LEFT TIBIA AND FIBULA - 2 VIEW COMPARISON:  Left ankle radiograph dated 09/10/2018 FINDINGS: There is no acute fracture or  dislocation. Distal fibular fixation side plate and screw noted. The hardware appears intact and with good contact with the bone. The ankle mortise is intact. Tiny corticated bone fragment anterior to the tibial plafond and likely chronic. There is diffuse subcutaneous soft tissue edema which may represent cellulitis. Clinical correlation is recommended. No soft tissue gas. IMPRESSION: 1. Diffuse subcutaneous edema, likely cellulitis. 2. No acute fracture or dislocation. 3. Hardware intact. Electronically Signed   By: Anner Crete M.D.   On: 10/27/2018 00:30   Ct Soft Tissue Neck W Contrast  Result Date: 10/27/2018 CLINICAL DATA:  Neck mass.  History of malignancy. EXAM: CT NECK WITH CONTRAST TECHNIQUE: Multidetector  CT imaging of the neck was performed using the standard protocol following the bolus administration of intravenous contrast. CONTRAST:  90mL OMNIPAQUE IOHEXOL 300 MG/ML  SOLN COMPARISON:  CTA neck 11/22/2017 FINDINGS: PHARYNX AND LARYNX: --Nasopharynx: Fossae of Rosenmuller are clear. Normal adenoid tonsils for age. --Oral cavity and oropharynx: The palatine and lingual tonsils are normal. The visible oral cavity and floor of mouth are normal. --Hypopharynx: The left piriform sinus is effaced in the hypopharynx is markedly rightward deviated. --Larynx: Normal epiglottis and pre-epiglottic space. Normal aryepiglottic and vocal folds. --Retropharyngeal space: There is a small retropharyngeal effusion measuring approximately 7 mm in thickness. SALIVARY GLANDS: --Parotid: No mass lesion or inflammation. No sialolithiasis or ductal dilatation. --Submandibular: Symmetric without inflammation. No sialolithiasis or ductal dilatation. --Sublingual: Normal. No ranula or other visible lesion of the base of tongue and floor of mouth. THYROID: 10 mm right thyroid nodule. LYMPH NODES: There are numerous enlarged lymph nodes throughout the neck. At left level 2 A, there is a necrotic node that measures 4.6 x  5.5 cm. Left level 5 A node measures 19 mm. Left level 5 B node measures 21 mm. There are numerous right level 2 and 3 lymph nodes, predominantly subcentimeter. There are multiple enlarged left supraclavicular nodes. VASCULAR: Major cervical vessels are patent. LIMITED INTRACRANIAL: Normal. VISUALIZED ORBITS: Normal. MASTOIDS AND VISUALIZED PARANASAL SINUSES: No fluid levels or advanced mucosal thickening. No mastoid effusion. SKELETON: C3-5 ACDF UPPER CHEST: Clear. OTHER: None. IMPRESSION: 1. Severe left-sided cervical and supraclavicular lymphadenopathy, including necrotic left level 2A node that measures 4.6 x 5.5 cm. There also numerous right-sided cervical lymph nodes, which are predominantly subcentimeter. Findings are most suggestive of lymphoma. 2. Small retropharyngeal effusion, likely reactive. Electronically Signed   By: Ulyses Jarred M.D.   On: 10/27/2018 02:19   Dg Chest Port 1 View  Result Date: 10/27/2018 CLINICAL DATA:  73 year old male with fever. EXAM: PORTABLE CHEST 1 VIEW COMPARISON:  Chest radiograph dated 05/18/2018 FINDINGS: The heart size and mediastinal contours are within normal limits. Both lungs are clear. The visualized skeletal structures are unremarkable. IMPRESSION: No active disease. Electronically Signed   By: Anner Crete M.D.   On: 10/27/2018 00:28   Vas [place] Lower Extremity Venous (dvt)  Result Date: 10/27/2018  Lower Venous Study Indications: Edema.  Limitations: Body habitus and poor ultrasound/tissue interface. Performing Technologist: Abram Sander RVS  Examination Guidelines: A complete evaluation includes B-mode imaging, spectral Doppler, color Doppler, and power Doppler as needed of all accessible portions of each vessel. Bilateral testing is considered an integral part of a complete examination. Limited examinations for reoccurring indications may be performed as noted.  +---------+---------------+---------+-----------+----------+------------------+  LEFT       Compressibility Phasicity Spontaneity Properties Summary             +---------+---------------+---------+-----------+----------+------------------+  CFV       Full            Yes       Yes                                        +---------+---------------+---------+-----------+----------+------------------+  SFJ       Full                                                                 +---------+---------------+---------+-----------+----------+------------------+  FV Prox   Full                                                                 +---------+---------------+---------+-----------+----------+------------------+  FV Mid    Full                                                                 +---------+---------------+---------+-----------+----------+------------------+  FV Distal Full                                                                 +---------+---------------+---------+-----------+----------+------------------+  PFV       Full                                                                 +---------+---------------+---------+-----------+----------+------------------+  POP       Full            Yes       Yes                                        +---------+---------------+---------+-----------+----------+------------------+  PTV       Full                                             limited                                                                         visualization       +---------+---------------+---------+-----------+----------+------------------+  PERO      Full                                             limited  visualization       +---------+---------------+---------+-----------+----------+------------------+     Summary: Left: There is no evidence of deep vein thrombosis in the lower extremity. However, portions of this examination were limited- see technologist comments above. No cystic structure found  in the popliteal fossa.  *See table(s) above for measurements and observations. Electronically signed by Servando Snare MD on 10/27/2018 at 12:24:25 PM.    Final     Scheduled Meds:  allopurinol  300 mg Oral Daily   atenolol  25 mg Oral Daily   atorvastatin  40 mg Oral Daily   insulin aspart  0-9 Units Subcutaneous TID WC   lisinopril  40 mg Oral Daily   potassium chloride  10 mEq Oral Daily   Continuous Infusions:  sodium chloride 125 mL/hr at 10/27/18 0717   ceFEPime (MAXIPIME) IV 2 g (10/27/18 1151)   vancomycin       LOS: 0 days   Marylu Lund, MD [facility] Pager On Amion  If 7PM-7AM, please contact night-coverage 10/27/2018, 1:58 PM

## 2018-10-28 ENCOUNTER — Inpatient Hospital Stay (HOSPITAL_COMMUNITY): Payer: Medicare HMO

## 2018-10-28 DIAGNOSIS — R509 Fever, unspecified: Secondary | ICD-10-CM

## 2018-10-28 LAB — ECHOCARDIOGRAM COMPLETE
Height: 74 in
Weight: 4726.66 oz

## 2018-10-28 LAB — GLUCOSE, CAPILLARY
Glucose-Capillary: 177 mg/dL — ABNORMAL HIGH (ref 70–99)
Glucose-Capillary: 140 mg/dL — ABNORMAL HIGH (ref 70–99)

## 2018-10-28 LAB — IGG, IGA, IGM
IgA: 386 mg/dL (ref 61–437)
IgG (Immunoglobin G), Serum: 572 mg/dL — ABNORMAL LOW (ref 603–1613)
IgM (Immunoglobulin M), Srm: 86 mg/dL (ref 20–172)

## 2018-10-28 LAB — LACTATE DEHYDROGENASE: LDH: 162 U/L (ref 98–192)

## 2018-10-28 MED ORDER — LISINOPRIL 40 MG PO TABS: 40.0000 mg | ORAL_TABLET | Freq: Every day | ORAL | 0 refills | Status: DC

## 2018-10-28 MED ORDER — TIZANIDINE HCL 4 MG PO TABS: 4.0000 mg | ORAL_TABLET | Freq: Two times a day (BID) | ORAL | 0 refills | Status: DC | PRN

## 2018-10-28 NOTE — Progress Notes (Signed)
Nsg Discharge Note  Admit Date:  10/26/2018 Discharge date: 10/28/2018   Ronald Rice to be D/C'd home  per MD order.  AVS completed.  Patient/caregiver able to verbalize understanding.  Discharge Medication: Allergies as of 10/28/2018   No Known Allergies     Medication List    STOP taking these medications   amoxicillin-clavulanate 875-125 MG tablet Commonly known as:  AUGMENTIN   lisinopril-hydrochlorothiazide 20-25 MG tablet Commonly known as:  ZESTORETIC     TAKE these medications   acetaminophen 500 MG tablet Commonly known as:  TYLENOL Take 500 mg by mouth every 6 (six) hours as needed (pain).   allopurinol 300 MG tablet Commonly known as:  ZYLOPRIM Take 300 mg by mouth daily. Notes to patient:  Next dose due 6/7   aspirin EC 325 MG tablet Take 1 tablet (325 mg total) by mouth daily. Take with food. Take x 1 month post op to decrease risk of blood clots. What changed:    how much to take  additional instructions Notes to patient:  One  month after surgery   atenolol 25 MG tablet Commonly known as:  TENORMIN Take 25 mg by mouth daily. Notes to patient:  Next dose due 6/7   atorvastatin 40 MG tablet Commonly known as:  LIPITOR Take 40 mg by mouth daily. Notes to patient:  Next dose due 6/7   Cialis 20 MG tablet Generic drug:  tadalafil Take 20 mg by mouth as needed for erectile dysfunction.   Cinnamon 500 MG Tabs Take 500 mg by mouth daily. Notes to patient:  Next dose due 6/7   diclofenac sodium 1 % Gel Commonly known as:  VOLTAREN Apply 1 application topically as needed.   famotidine 20 MG tablet Commonly known as:  PEPCID Take 20 mg by mouth at bedtime as needed for heartburn.   HYDROcodone-acetaminophen 5-325 MG tablet Commonly known as:  NORCO/VICODIN Take 1-2 tablets by mouth every 6 (six) hours as needed for moderate pain.   ibuprofen 800 MG tablet Commonly known as:  ADVIL Take 800 mg by mouth every 8 (eight) hours as needed (pain).    ketoconazole 2 % cream Commonly known as:  NIZORAL Apply 1 application topically as needed for irritation.   lisinopril 40 MG tablet Commonly known as:  ZESTRIL Take 1 tablet (40 mg total) by mouth daily for 30 days. Start taking on:  October 29, 2018 Notes to patient:  Next dose due 6/7   NON FORMULARY Take 1 tablet by mouth daily. Blood Sugar Defense Notes to patient:  Next dose due 6/7   potassium chloride 10 MEQ tablet Commonly known as:  K-DUR Take 10 mEq by mouth daily. Notes to patient:   Next dose due 6/7   tamsulosin 0.4 MG Caps capsule Commonly known as:  FLOMAX Take 0.4 mg by mouth daily. Notes to patient:  Next dose due  6/7   tiZANidine 4 MG tablet Commonly known as:  ZANAFLEX Take 1 tablet (4 mg total) by mouth every 12 (twelve) hours as needed for muscle spasms.       Discharge Assessment: Vitals:   10/28/18 0630 10/28/18 0903  BP: (!) 183/94 (!) 151/92  Pulse: 99 92  Resp:    Temp: 98.3 F (36.8 C)   SpO2: 99% 95%   Skin clean, dry and intact without evidence of skin break down, no evidence of skin tears noted. IV catheter discontinued intact. Site without signs and symptoms of complications - no redness or edema  noted at insertion site, patient denies c/o pain - only slight tenderness at site.  Dressing with slight pressure applied.  D/c Instructions-Education: Discharge instructions given to patient/family with verbalized understanding. D/c education completed with patient/family including follow up instructions, medication list, d/c activities limitations if indicated, with other d/c instructions as indicated by MD - patient able to verbalize understanding, all questions fully answered. Patient instructed to return to ED, call 911, or call MD for any changes in condition.  Patient escorted via [place], and D/C home via private auto.  Nohealani Medinger, Jolene Schimke, RN 10/28/2018 4:48 PM

## 2018-10-28 NOTE — Discharge Summary (Signed)
Physician Discharge Summary  Ronald Rice ZOX:[id number] DOB: 02-21-1946 DOA: 10/26/2018  PCP: Deland Pretty, MD  Admit date: 10/26/2018 Discharge date: 10/28/2018  Admitted From: Home Disposition:  Home  Recommendations for Outpatient Follow-up:  1. Follow up with PCP in 2-3 weeks 2. Follow up with Dr. Marin Olp pending node biopsy result  Discharge Condition:Stable CODE STATUS:Full Diet recommendation: Diabetic   Brief/Interim Summary: 73 y.o.malewithhistory of diabetes mellitus on diet, hypertension, CAD, history of CLL in remission was treated in [place] in 2015 follows with Dr. Burney Gauze presents to the ER for the second time in last 24 hours because of worsening swelling in the neck on the left side. Also noticed some swelling in his left leg out he had a fall 2 days ago. Patient states his left neck swelling was started to be noticed 4 days ago which is gradually progressed I come to the ER day ago and at the time was advised to follow-up with primary care physician for further work-up. The swelling is worsened as per the patient but denies any difficulty swallowing or speaking. Denies any shortness of the cough. Has been exam swelling and tenderness of the left leg after a fall.  ED Course:In the ER patient was febrile with temperature 101.5 degree tachycardic and COVID-19 was negative. Chest x-ray unremarkable. CT of the neck shows features concerning for lymphoma with markedly enlarged cervical and supraclavicular lymph nodes. X-ray of the left leg shows soft tissue status concerning for cellulitis. Patient had blood cultures drawn and plan placed on empiric antibiotics.  Discharge Diagnoses:  Principal Problem:   Sepsis ([place]) Active Problems:   Chronic lymphoid leukemia ([facility])   CAD (coronary artery disease)   Type 2 diabetes mellitus without complications ([place])   Essential (primary) hypertension   Cellulitis of left leg  1. Fevers present on admit 1. Remained  hemodynamically stable 2. Sepsis ruled out 3. Fevers likely secondary to lymphoma 2. Cervical and left supraclavicular lymphadenopathy concerning for lymphoma 1. Patient known to Dr. Marin Olp, last seen one year ago 2. Patient notes worsening L sided neck swelling over past 4 days prior to admit 3. CT neck personally reviewed. Findings worrisome for lymphoma 4. Pt now s/p node biopsy, pending results 5. Pt now s/p CT chest, abd, pelvis with findings  Of diffuse lymphadenopathy with findings suspicious of lymphoma 6. Appreciate input by Oncology. Anticipate treatment pending result of biopsy. Discussed with on-call Oncologist today, OK to d/c home with close outpatient follow up 3. Hypertension on atenolol lisinopril. 1. BP stable at this time 2. Diuretics on hold as pt was given IVF at time of presentation 4. Gout  1. Continued on allopurinol 2. Stable at present 5. Diabetes mellitus type 2 1. Continue sliding scale coverage while in hospital. 2. Glucose trends stable at this time 6. Previous history of CLL. 1. Pt known to Oncology, last seen one year prior and originally had scheduled f/u last month. Pt was lost to f/u at that time 2. Concerns of recurrent lymphoma per above. Pt to follow up with Dr. Marin Olp on discharge  Discharge Instructions   Allergies as of 10/28/2018   No Known Allergies     Medication List    STOP taking these medications   amoxicillin-clavulanate 875-125 MG tablet Commonly known as:  AUGMENTIN   lisinopril-hydrochlorothiazide 20-25 MG tablet Commonly known as:  ZESTORETIC     TAKE these medications   acetaminophen 500 MG tablet Commonly known as:  TYLENOL Take 500 mg by mouth every 6 (  six) hours as needed (pain).   allopurinol 300 MG tablet Commonly known as:  ZYLOPRIM Take 300 mg by mouth daily. Notes to patient:  6/7   aspirin EC 325 MG tablet Take 1 tablet (325 mg total) by mouth daily. Take with food. Take x 1 month post op to decrease  risk of blood clots. What changed:    how much to take  additional instructions   atenolol 25 MG tablet Commonly known as:  TENORMIN Take 25 mg by mouth daily. Notes to patient:  6/7   atorvastatin 40 MG tablet Commonly known as:  LIPITOR Take 40 mg by mouth daily. Notes to patient:  6/7   Cialis 20 MG tablet Generic drug:  tadalafil Take 20 mg by mouth as needed for erectile dysfunction.   Cinnamon 500 MG Tabs Take 500 mg by mouth daily. Notes to patient:  6/7   diclofenac sodium 1 % Gel Commonly known as:  VOLTAREN Apply 1 application topically as needed.   famotidine 20 MG tablet Commonly known as:  PEPCID Take 20 mg by mouth at bedtime as needed for heartburn.   HYDROcodone-acetaminophen 5-325 MG tablet Commonly known as:  NORCO/VICODIN Take 1-2 tablets by mouth every 6 (six) hours as needed for moderate pain.   ibuprofen 800 MG tablet Commonly known as:  ADVIL Take 800 mg by mouth every 8 (eight) hours as needed (pain).   ketoconazole 2 % cream Commonly known as:  NIZORAL Apply 1 application topically as needed for irritation.   lisinopril 40 MG tablet Commonly known as:  ZESTRIL Take 1 tablet (40 mg total) by mouth daily for 30 days. Start taking on:  October 29, 2018   NON FORMULARY Take 1 tablet by mouth daily. Blood Sugar Defense Notes to patient:  6/7   potassium chloride 10 MEQ tablet Commonly known as:  K-DUR Take 10 mEq by mouth daily. Notes to patient:  6/7   tamsulosin 0.4 MG Caps capsule Commonly known as:  FLOMAX Take 0.4 mg by mouth daily. Notes to patient:  6/7   tiZANidine 4 MG tablet Commonly known as:  ZANAFLEX Take 1 tablet (4 mg total) by mouth every 12 (twelve) hours as needed for muscle spasms.      Follow-up Information    Deland Pretty, MD. Schedule an appointment as soon as possible for a visit in 3 week(s).   Specialty:  Internal Medicine Contact information: [address] [place]  [address] [phone]        Volanda Napoleon, MD Follow up.   Specialty:  Oncology Why:  follow up as scheduled Contact information: [address] [place] [address] [phone]          No Known Allergies  Consultations:  Oncology  Procedures/Studies: Dg Tibia/fibula Left  Result Date: 10/27/2018 CLINICAL DATA:  73 year old male with left leg swelling. EXAM: LEFT TIBIA AND FIBULA - 2 VIEW COMPARISON:  Left ankle radiograph dated 09/10/2018 FINDINGS: There is no acute fracture or dislocation. Distal fibular fixation side plate and screw noted. The hardware appears intact and with good contact with the bone. The ankle mortise is intact. Tiny corticated bone fragment anterior to the tibial plafond and likely chronic. There is diffuse subcutaneous soft tissue edema which may represent cellulitis. Clinical correlation is recommended. No soft tissue gas. IMPRESSION: 1. Diffuse subcutaneous edema, likely cellulitis. 2. No acute fracture or dislocation. 3. Hardware intact. Electronically Signed   By: Laren Everts.D.  On: 10/27/2018 00:30   Ct Soft Tissue Neck W Contrast  Result Date: 10/27/2018 CLINICAL DATA:  Neck mass.  History of malignancy. EXAM: CT NECK WITH CONTRAST TECHNIQUE: Multidetector CT imaging of the neck was performed using the standard protocol following the bolus administration of intravenous contrast. CONTRAST:  9mL OMNIPAQUE IOHEXOL 300 MG/ML  SOLN COMPARISON:  CTA neck 11/22/2017 FINDINGS: PHARYNX AND LARYNX: --Nasopharynx: Fossae of Rosenmuller are clear. Normal adenoid tonsils for age. --Oral cavity and oropharynx: The palatine and lingual tonsils are normal. The visible oral cavity and floor of mouth are normal. --Hypopharynx: The left piriform sinus is effaced in the hypopharynx is markedly rightward deviated. --Larynx: Normal epiglottis and pre-epiglottic space. Normal aryepiglottic and vocal folds. --Retropharyngeal space: There is a small  retropharyngeal effusion measuring approximately 7 mm in thickness. SALIVARY GLANDS: --Parotid: No mass lesion or inflammation. No sialolithiasis or ductal dilatation. --Submandibular: Symmetric without inflammation. No sialolithiasis or ductal dilatation. --Sublingual: Normal. No ranula or other visible lesion of the base of tongue and floor of mouth. THYROID: 10 mm right thyroid nodule. LYMPH NODES: There are numerous enlarged lymph nodes throughout the neck. At left level 2 A, there is a necrotic node that measures 4.6 x 5.5 cm. Left level 5 A node measures 19 mm. Left level 5 B node measures 21 mm. There are numerous right level 2 and 3 lymph nodes, predominantly subcentimeter. There are multiple enlarged left supraclavicular nodes. VASCULAR: Major cervical vessels are patent. LIMITED INTRACRANIAL: Normal. VISUALIZED ORBITS: Normal. MASTOIDS AND VISUALIZED PARANASAL SINUSES: No fluid levels or advanced mucosal thickening. No mastoid effusion. SKELETON: C3-5 ACDF UPPER CHEST: Clear. OTHER: None. IMPRESSION: 1. Severe left-sided cervical and supraclavicular lymphadenopathy, including necrotic left level 2A node that measures 4.6 x 5.5 cm. There also numerous right-sided cervical lymph nodes, which are predominantly subcentimeter. Findings are most suggestive of lymphoma. 2. Small retropharyngeal effusion, likely reactive. Electronically Signed   By: Ulyses Jarred M.D.   On: 10/27/2018 02:19   Ct Chest W Contrast  Result Date: 10/27/2018 CLINICAL DATA:  Right back pain EXAM: CT CHEST, ABDOMEN, AND PELVIS WITH CONTRAST TECHNIQUE: Multidetector CT imaging of the chest, abdomen and pelvis was performed following the standard protocol during bolus administration of intravenous contrast. CONTRAST:  125mL OMNIPAQUE IOHEXOL 300 MG/ML SOLN, additional oral enteric contrast COMPARISON:  CT neck, 10/27/2018, CT abdomen pelvis, 04/18/2018 02/05/2018 FINDINGS: CT CHEST FINDINGS Cardiovascular: Coronary artery  calcifications. Normal heart size. No pericardial effusion. Mediastinum/Nodes: Very bulky left cervical and supraclavicular lymphadenopathy, better appreciated by same-day CT examination of the neck. There are prominent bilateral axillary and mediastinal lymph nodes, largest pretracheal nodes measuring 1.7 cm in short axis (series 3, image 25). Thyroid gland, trachea, and esophagus demonstrate no significant findings. Lungs/Pleura: Bibasilar scarring or atelectasis. Unchanged 6 mm pulmonary nodule of the left lung base (series 4, image 99). No pleural effusion or pneumothorax. Musculoskeletal: No chest wall mass or suspicious bone lesions identified. CT ABDOMEN PELVIS FINDINGS Hepatobiliary: No solid liver abnormality is seen. Numerous redemonstrated low-attenuation lesions of the liver, likely simple cysts or hemangiomata. No gallstones, gallbladder wall thickening, or biliary dilatation. Pancreas: Unremarkable. No pancreatic ductal dilatation or surrounding inflammatory changes. Spleen: Normal in size without significant abnormality. Adrenals/Urinary Tract: Unchanged non nodular thickening of the adrenal glands. The left kidney is grossly deformed by extremely bulky exophytic cysts, largest measuring at least 15.3 cm. There are smaller cysts of the right kidney. Bladder is unremarkable. Stomach/Bowel: Stomach is within normal limits. Appendix appears normal. No evidence  of bowel wall thickening, distention, or inflammatory changes. Vascular/Lymphatic: Scattered calcific atherosclerosis. Numerous prominent gastrohepatic, retroperitoneal and mesenteric lymph nodes, increased in size compared to prior CT, largest mesenteric node measuring 2.8 cm in short axis (series 3, image 93). Reproductive: No mass or other abnormality. Other: No abdominal wall hernia or abnormality. No abdominopelvic ascites. Musculoskeletal: Postoperative findings of lumbar laminectomy and fusion of L3 through S1 and the sacroiliac joints.  IMPRESSION: 1. No acute CT findings of the abdomen or pelvis to explain right back pain. No evidence of urinary tract calculus or hydronephrosis. Normal appendix. Postoperative findings of lumbosacral fusion. 2. Very bulky left cervical and supraclavicular lymphadenopathy, better appreciated by same-day CT examination of the neck. There are prominent bilateral axillary and mediastinal lymph nodes, largest pretracheal nodes measuring 1.7 cm in short axis (series 3, image 25). Numerous prominent gastrohepatic, retroperitoneal and mesenteric lymph nodes, increased in size compared to prior CT, largest mesenteric node measuring 2.8 cm in short axis (series 3, image 93). Constellation of findings is concerning for lymphoma. 3. Unchanged 6 mm pulmonary nodule of the left lung base (series 4, image 99). Attention on follow-up. 4. The left kidney is again grossly deformed by extremely bulky exophytic cysts, largest measuring at least 15.3 cm. Cysts may be symptomatic given size. Electronically Signed   By: Eddie Candle M.D.   On: 10/27/2018 20:32   Ct Abdomen Pelvis W Contrast  Result Date: 10/27/2018 CLINICAL DATA:  Right back pain EXAM: CT CHEST, ABDOMEN, AND PELVIS WITH CONTRAST TECHNIQUE: Multidetector CT imaging of the chest, abdomen and pelvis was performed following the standard protocol during bolus administration of intravenous contrast. CONTRAST:  120mL OMNIPAQUE IOHEXOL 300 MG/ML SOLN, additional oral enteric contrast COMPARISON:  CT neck, 10/27/2018, CT abdomen pelvis, 04/18/2018 02/05/2018 FINDINGS: CT CHEST FINDINGS Cardiovascular: Coronary artery calcifications. Normal heart size. No pericardial effusion. Mediastinum/Nodes: Very bulky left cervical and supraclavicular lymphadenopathy, better appreciated by same-day CT examination of the neck. There are prominent bilateral axillary and mediastinal lymph nodes, largest pretracheal nodes measuring 1.7 cm in short axis (series 3, image 25). Thyroid gland,  trachea, and esophagus demonstrate no significant findings. Lungs/Pleura: Bibasilar scarring or atelectasis. Unchanged 6 mm pulmonary nodule of the left lung base (series 4, image 99). No pleural effusion or pneumothorax. Musculoskeletal: No chest wall mass or suspicious bone lesions identified. CT ABDOMEN PELVIS FINDINGS Hepatobiliary: No solid liver abnormality is seen. Numerous redemonstrated low-attenuation lesions of the liver, likely simple cysts or hemangiomata. No gallstones, gallbladder wall thickening, or biliary dilatation. Pancreas: Unremarkable. No pancreatic ductal dilatation or surrounding inflammatory changes. Spleen: Normal in size without significant abnormality. Adrenals/Urinary Tract: Unchanged non nodular thickening of the adrenal glands. The left kidney is grossly deformed by extremely bulky exophytic cysts, largest measuring at least 15.3 cm. There are smaller cysts of the right kidney. Bladder is unremarkable. Stomach/Bowel: Stomach is within normal limits. Appendix appears normal. No evidence of bowel wall thickening, distention, or inflammatory changes. Vascular/Lymphatic: Scattered calcific atherosclerosis. Numerous prominent gastrohepatic, retroperitoneal and mesenteric lymph nodes, increased in size compared to prior CT, largest mesenteric node measuring 2.8 cm in short axis (series 3, image 93). Reproductive: No mass or other abnormality. Other: No abdominal wall hernia or abnormality. No abdominopelvic ascites. Musculoskeletal: Postoperative findings of lumbar laminectomy and fusion of L3 through S1 and the sacroiliac joints. IMPRESSION: 1. No acute CT findings of the abdomen or pelvis to explain right back pain. No evidence of urinary tract calculus or hydronephrosis. Normal appendix. Postoperative findings of lumbosacral  fusion. 2. Very bulky left cervical and supraclavicular lymphadenopathy, better appreciated by same-day CT examination of the neck. There are prominent bilateral  axillary and mediastinal lymph nodes, largest pretracheal nodes measuring 1.7 cm in short axis (series 3, image 25). Numerous prominent gastrohepatic, retroperitoneal and mesenteric lymph nodes, increased in size compared to prior CT, largest mesenteric node measuring 2.8 cm in short axis (series 3, image 93). Constellation of findings is concerning for lymphoma. 3. Unchanged 6 mm pulmonary nodule of the left lung base (series 4, image 99). Attention on follow-up. 4. The left kidney is again grossly deformed by extremely bulky exophytic cysts, largest measuring at least 15.3 cm. Cysts may be symptomatic given size. Electronically Signed   By: Eddie Candle M.D.   On: 10/27/2018 20:32   Dg Chest Port 1 View  Result Date: 10/27/2018 CLINICAL DATA:  73 year old male with fever. EXAM: PORTABLE CHEST 1 VIEW COMPARISON:  Chest radiograph dated 05/18/2018 FINDINGS: The heart size and mediastinal contours are within normal limits. Both lungs are clear. The visualized skeletal structures are unremarkable. IMPRESSION: No active disease. Electronically Signed   By: Anner Crete M.D.   On: 10/27/2018 00:28   [place] Core Biopsy (lymph Nodes)  Result Date: 10/27/2018 INDICATION: 73 year old male with massive left cervical and supraclavicular lymphadenopathy EXAM: ULTRASOUND GUIDED core BIOPSY OF left neck lymphadenopathy MEDICATIONS: None. ANESTHESIA/SEDATION: None PROCEDURE: The procedure, risks, benefits, and alternatives were explained to the patient. Questions regarding the procedure were encouraged and answered. The patient understands and consents to the procedure. The left neck was prepped with chlorhexidine in a sterile fashion, and a sterile drape was applied covering the operative field. A sterile gown and sterile gloves were used for the procedure. Local anesthesia was provided with 1% Lidocaine. A small dermatotomy was made. Under real-time sonographic guidance, multiple 18 gauge core biopsies were obtained of  the lymphadenopathy using a bio Pince automated biopsy device. Post biopsy ultrasound imaging demonstrates no evidence of immediate complication. The patient tolerated the procedure well. COMPLICATIONS: None immediate. FINDINGS: Technical success without evidence of complication. IMPRESSION: Successful ultrasound-guided core biopsy of left cervical lymphadenopathy. Electronically Signed   By: Jacqulynn Cadet M.D.   On: 10/27/2018 17:19   Vas [place] Lower Extremity Venous (dvt)  Result Date: 10/27/2018  Lower Venous Study Indications: Edema.  Limitations: Body habitus and poor ultrasound/tissue interface. Performing Technologist: Abram Sander RVS  Examination Guidelines: A complete evaluation includes B-mode imaging, spectral Doppler, color Doppler, and power Doppler as needed of all accessible portions of each vessel. Bilateral testing is considered an integral part of a complete examination. Limited examinations for reoccurring indications may be performed as noted.  +---------+---------------+---------+-----------+----------+------------------+ LEFT     CompressibilityPhasicitySpontaneityPropertiesSummary            +---------+---------------+---------+-----------+----------+------------------+ CFV      Full           Yes      Yes                                     +---------+---------------+---------+-----------+----------+------------------+ SFJ      Full                                                            +---------+---------------+---------+-----------+----------+------------------+  FV Prox  Full                                                            +---------+---------------+---------+-----------+----------+------------------+ FV Mid   Full                                                            +---------+---------------+---------+-----------+----------+------------------+ FV DistalFull                                                             +---------+---------------+---------+-----------+----------+------------------+ PFV      Full                                                            +---------+---------------+---------+-----------+----------+------------------+ POP      Full           Yes      Yes                                     +---------+---------------+---------+-----------+----------+------------------+ PTV      Full                                         limited                                                                  visualization      +---------+---------------+---------+-----------+----------+------------------+ PERO     Full                                         limited                                                                  visualization      +---------+---------------+---------+-----------+----------+------------------+     Summary: Left: There is no evidence of deep vein thrombosis in the lower extremity. However, portions of this examination were limited- see technologist comments above. No cystic structure found in the popliteal fossa.  *See table(s) above  for measurements and observations. Electronically signed by Servando Snare MD on 10/27/2018 at 12:24:25 PM.    Final      Subjective: Eager to go home  Discharge Exam: Vitals:   10/28/18 0630 10/28/18 0903  BP: (!) 183/94 (!) 151/92  Pulse: 99 92  Resp:    Temp: 98.3 F (36.8 C)   SpO2: 99% 95%   Vitals:   10/27/18 2229 10/28/18 0311 10/28/18 0630 10/28/18 0903  BP: 127/73  (!) 183/94 (!) 151/92  Pulse: 84  99 92  Resp:      Temp: 98.3 F (36.8 C)  98.3 F (36.8 C)   TempSrc: Oral  Oral   SpO2: 97%  99% 95%  Weight:  134 kg    Height:        General: Pt is alert, awake, not in acute distress, L neck swollen Cardiovascular: RRR, S1/S2 +, no rubs, no gallops Respiratory: CTA bilaterally, no wheezing, no rhonchi Abdominal: Soft, NT, ND, bowel sounds + Extremities: no edema, no  cyanosis   The results of significant diagnostics from this hospitalization (including imaging, microbiology, ancillary and laboratory) are listed below for reference.     Microbiology: Recent Results (from the past 240 hour(s))  Blood Culture (routine x 2)     Status: None (Preliminary result)   Collection Time: 10/27/18 12:15 AM  Result Value Ref Range Status   Specimen Description BLOOD RIGHT ARM  Final   Special Requests   Final    BOTTLES DRAWN AEROBIC AND ANAEROBIC Blood Culture adequate volume   Culture   Final    NO GROWTH 1 DAY Performed at [facility], [address], [place], [place] [address]    Report Status PENDING  Incomplete  Blood Culture (routine x 2)     Status: None (Preliminary result)   Collection Time: 10/27/18 12:19 AM  Result Value Ref Range Status   Specimen Description BLOOD LEFT ARM  Final   Special Requests   Final    BOTTLES DRAWN AEROBIC AND ANAEROBIC Blood Culture adequate volume   Culture   Final    NO GROWTH 1 DAY Performed at [facility], [place] [address], [place], [place] [address]    Report Status PENDING  Incomplete  SARS Coronavirus 2 (CEPHEID - Performed in [facility] lab), Hosp Order     Status: None   Collection Time: 10/27/18 12:32 AM  Result Value Ref Range Status   SARS Coronavirus 2 NEGATIVE NEGATIVE Final    Comment: (NOTE) If result is NEGATIVE SARS-CoV-2 target nucleic acids are NOT DETECTED. The SARS-CoV-2 RNA is generally detectable in upper and lower  respiratory specimens during the acute phase of infection. The lowest  concentration of SARS-CoV-2 viral copies this assay can detect is 250  copies / mL. A negative result does not preclude SARS-CoV-2 infection  and should not be used as the sole basis for treatment or other  patient management decisions.  A negative result may occur with  improper specimen collection / handling, submission of specimen other  than nasopharyngeal swab, presence of viral  mutation(s) within the  areas targeted by this assay, and inadequate number of viral copies  (<250 copies / mL). A negative result must be combined with clinical  observations, patient history, and epidemiological information. If result is POSITIVE SARS-CoV-2 target nucleic acids are DETECTED. The SARS-CoV-2 RNA is generally detectable in upper and lower  respiratory specimens dur ing the acute phase of infection.  Positive  results are indicative of active infection with SARS-CoV-2.  Clinical  correlation with patient history and other diagnostic information is  necessary to determine patient infection status.  Positive results do  not rule out bacterial infection or co-infection with other viruses. If result is PRESUMPTIVE POSTIVE SARS-CoV-2 nucleic acids MAY BE PRESENT.   A presumptive positive result was obtained on the submitted specimen  and confirmed on repeat testing.  While 2019 novel coronavirus  (SARS-CoV-2) nucleic acids may be present in the submitted sample  additional confirmatory testing may be necessary for epidemiological  and / or clinical management purposes  to differentiate between  SARS-CoV-2 and other Sarbecovirus currently known to infect humans.  If clinically indicated additional testing with an alternate test  methodology [phone]) is advised. The SARS-CoV-2 RNA is generally  detectable in upper and lower respiratory sp ecimens during the acute  phase of infection. The expected result is Negative. Fact Sheet for Patients:  StrictlyIdeas.no Fact Sheet for Healthcare Providers: BankingDealers.co.za This test is not yet approved or cleared by the [place] FDA and has been authorized for detection and/or diagnosis of SARS-CoV-2 by FDA under an Emergency Use Authorization (EUA).  This EUA will remain in effect (meaning this test can be used) for the duration of the COVID-19 declaration under Section 564(b)(1)  of the Act, 21 U.S.C. section 360bbb-3(b)(1), unless the authorization is terminated or revoked sooner. Performed at [facility], [place] [address] [place], [place] [address]      Labs: BNP (last 3 results) No results for input(s): BNP in the last 8760 hours. Basic Metabolic Panel: Recent Labs  Lab 10/26/18 2345  NA 136  K 3.7  CL 98  CO2 27  GLUCOSE 177*  BUN 8  CREATININE 1.18  CALCIUM 9.5   Liver Function Tests: Recent Labs  Lab 10/26/18 2345  AST 21  ALT 23  ALKPHOS 71  BILITOT 1.3*  PROT 7.1  ALBUMIN 4.4   No results for input(s): LIPASE, AMYLASE in the last 168 hours. No results for input(s): AMMONIA in the last 168 hours. CBC: Recent Labs  Lab 10/26/18 2345  WBC 11.5*  NEUTROABS 5.0  HGB 14.7  HCT 40.5  MCV 86.7  PLT 168   Cardiac Enzymes: No results for input(s): CKTOTAL, CKMB, CKMBINDEX, TROPONINI in the last 168 hours. BNP: Invalid input(s): POCBNP CBG: Recent Labs  Lab 10/27/18 1138 10/27/18 1710 10/27/18 2230 10/28/18 0810 10/28/18 1212  GLUCAP 153* 168* 150* 177* 140*   D-Dimer No results for input(s): DDIMER in the last 72 hours. Hgb A1c No results for input(s): HGBA1C in the last 72 hours. Lipid Profile No results for input(s): CHOL, HDL, LDLCALC, TRIG, CHOLHDL, LDLDIRECT in the last 72 hours. Thyroid function studies No results for input(s): TSH, T4TOTAL, T3FREE, THYROIDAB in the last 72 hours.  Invalid input(s): FREET3 Anemia work up No results for input(s): VITAMINB12, FOLATE, FERRITIN, TIBC, IRON, RETICCTPCT in the last 72 hours. Urinalysis    Component Value Date/Time   COLORURINE YELLOW 10/27/2018 0210   APPEARANCEUR CLEAR 10/27/2018 0210   LABSPEC 1.016 10/27/2018 0210   PHURINE 7.0 10/27/2018 0210   GLUCOSEU NEGATIVE 10/27/2018 0210   HGBUR NEGATIVE 10/27/2018 0210   BILIRUBINUR NEGATIVE 10/27/2018 0210   KETONESUR NEGATIVE 10/27/2018 0210   PROTEINUR NEGATIVE 10/27/2018 0210   NITRITE NEGATIVE  10/27/2018 0210   LEUKOCYTESUR NEGATIVE 10/27/2018 0210   Sepsis Labs Invalid input(s): PROCALCITONIN,  WBC,  LACTICIDVEN Microbiology Recent Results (from the past 240 hour(s))  Blood Culture (routine x 2)     Status: None (Preliminary result)   Collection Time: 10/27/18 12:15 AM  Result Value Ref Range Status   Specimen Description BLOOD RIGHT ARM  Final   Special Requests   Final    BOTTLES DRAWN AEROBIC AND ANAEROBIC Blood Culture adequate volume   Culture   Final    NO GROWTH 1 DAY Performed at [facility], [place] [address] [place], [place] [address]    Report Status PENDING  Incomplete  Blood Culture (routine x 2)     Status: None (Preliminary result)   Collection Time: 10/27/18 12:19 AM  Result Value Ref Range Status   Specimen Description BLOOD LEFT ARM  Final   Special Requests   Final    BOTTLES DRAWN AEROBIC AND ANAEROBIC Blood Culture adequate volume   Culture   Final    NO GROWTH 1 DAY Performed at [facility], [place] [address], [place], [place] [address]    Report Status PENDING  Incomplete  SARS Coronavirus 2 (CEPHEID - Performed in [place] [facility] lab), Hosp Order     Status: None   Collection Time: 10/27/18 12:32 AM  Result Value Ref Range Status   SARS Coronavirus 2 NEGATIVE NEGATIVE Final    Comment: (NOTE) If result is NEGATIVE SARS-CoV-2 target nucleic acids are NOT DETECTED. The SARS-CoV-2 RNA is generally detectable in upper and lower  respiratory specimens during the acute phase of infection. The lowest  concentration of SARS-CoV-2 viral copies this assay can detect is 250  copies / mL. A negative result does not preclude SARS-CoV-2 infection  and should not be used as the sole basis for treatment or other  patient management decisions.  A negative result may occur with  improper specimen collection / handling, submission of specimen other  than nasopharyngeal swab, presence of viral mutation(s) within the  areas targeted by this  assay, and inadequate number of viral copies  (<250 copies / mL). A negative result must be combined with clinical  observations, patient history, and epidemiological information. If result is POSITIVE SARS-CoV-2 target nucleic acids are DETECTED. The SARS-CoV-2 RNA is generally detectable in upper and lower  respiratory specimens dur ing the acute phase of infection.  Positive  results are indicative of active infection with SARS-CoV-2.  Clinical  correlation with patient history and other diagnostic information is  necessary to determine patient infection status.  Positive results do  not rule out bacterial infection or co-infection with other viruses. If result is PRESUMPTIVE POSTIVE SARS-CoV-2 nucleic acids MAY BE PRESENT.   A presumptive positive result was obtained on the submitted specimen  and confirmed on repeat testing.  While 2019 novel coronavirus  (SARS-CoV-2) nucleic acids may be present in the submitted sample  additional confirmatory testing may be necessary for epidemiological  and / or clinical management purposes  to differentiate between  SARS-CoV-2 and other Sarbecovirus currently known to infect humans.  If clinically indicated additional testing with an alternate test  methodology [phone]) is advised. The SARS-CoV-2 RNA is generally  detectable in upper and lower respiratory sp ecimens during the acute  phase of infection. The expected result is Negative. Fact Sheet for Patients:  StrictlyIdeas.no Fact Sheet for Healthcare Providers: BankingDealers.co.za This test is not yet approved or cleared by the [place] FDA and has been authorized for detection and/or diagnosis of SARS-CoV-2 by FDA under an Emergency Use Authorization (EUA).  This EUA will remain in effect (meaning this  test can be used) for the duration of the COVID-19 declaration under Section 564(b)(1) of the Act, 21 U.S.C. section 360bbb-3(b)(1),  unless the authorization is terminated or revoked sooner. Performed at [facility], [place] [address], [place],  [address]    Time spent: 30 min  SIGNED:   Marylu Lund, MD  [facility] 10/28/2018, 3:05 PM  If 7PM-7AM, please contact night-coverage

## 2018-10-30 ENCOUNTER — Other Ambulatory Visit: Payer: Self-pay

## 2018-10-30 LAB — BETA 2 MICROGLOBULIN, SERUM: Beta-2 Microglobulin: 2.5 mg/L — ABNORMAL HIGH (ref 0.6–2.4)

## 2018-10-30 MED ORDER — TRAMADOL HCL 50 MG PO TABS: 50.0000 mg | ORAL_TABLET | Freq: Four times a day (QID) | ORAL | 0 refills | Status: DC | PRN

## 2018-10-31 ENCOUNTER — Inpatient Hospital Stay: Payer: Medicare HMO

## 2018-10-31 ENCOUNTER — Inpatient Hospital Stay: Payer: Medicare HMO | Admitting: Hematology & Oncology

## 2018-10-31 ENCOUNTER — Other Ambulatory Visit: Payer: Self-pay | Admitting: Hematology & Oncology

## 2018-10-31 ENCOUNTER — Encounter: Payer: Self-pay | Admitting: Hematology & Oncology

## 2018-10-31 DIAGNOSIS — C9110 Chronic lymphocytic leukemia of B-cell type not having achieved remission: Secondary | ICD-10-CM

## 2018-10-31 DIAGNOSIS — Z7189 Other specified counseling: Secondary | ICD-10-CM

## 2018-10-31 DIAGNOSIS — C8338 Diffuse large B-cell lymphoma, lymph nodes of multiple sites: Secondary | ICD-10-CM

## 2018-10-31 HISTORY — DX: Other specified counseling: Z71.89

## 2018-10-31 HISTORY — DX: Diffuse large b-cell lymphoma, lymph nodes of multiple sites: C83.38

## 2018-11-01 ENCOUNTER — Encounter: Payer: Self-pay | Admitting: *Deleted

## 2018-11-01 ENCOUNTER — Encounter: Payer: Self-pay | Admitting: Hematology & Oncology

## 2018-11-01 ENCOUNTER — Telehealth: Payer: Self-pay | Admitting: Hematology & Oncology

## 2018-11-01 ENCOUNTER — Inpatient Hospital Stay (HOSPITAL_BASED_OUTPATIENT_CLINIC_OR_DEPARTMENT_OTHER): Payer: Medicare HMO | Admitting: Hematology & Oncology

## 2018-11-01 ENCOUNTER — Other Ambulatory Visit: Payer: Self-pay

## 2018-11-01 ENCOUNTER — Inpatient Hospital Stay: Payer: Medicare HMO | Attending: Hematology & Oncology

## 2018-11-01 VITALS — BP 153/99 | HR 83 | Temp 98.7°F | Resp 20 | Wt 286.1 lb

## 2018-11-01 DIAGNOSIS — C8338 Diffuse large B-cell lymphoma, lymph nodes of multiple sites: Secondary | ICD-10-CM | POA: Insufficient documentation

## 2018-11-01 DIAGNOSIS — Z7982 Long term (current) use of aspirin: Secondary | ICD-10-CM | POA: Insufficient documentation

## 2018-11-01 DIAGNOSIS — Z791 Long term (current) use of non-steroidal anti-inflammatories (NSAID): Secondary | ICD-10-CM | POA: Insufficient documentation

## 2018-11-01 DIAGNOSIS — Z79899 Other long term (current) drug therapy: Secondary | ICD-10-CM

## 2018-11-01 DIAGNOSIS — E119 Type 2 diabetes mellitus without complications: Secondary | ICD-10-CM | POA: Diagnosis not present

## 2018-11-01 DIAGNOSIS — Z5111 Encounter for antineoplastic chemotherapy: Secondary | ICD-10-CM | POA: Diagnosis not present

## 2018-11-01 DIAGNOSIS — Z5189 Encounter for other specified aftercare: Secondary | ICD-10-CM | POA: Insufficient documentation

## 2018-11-01 DIAGNOSIS — Z5112 Encounter for antineoplastic immunotherapy: Secondary | ICD-10-CM | POA: Diagnosis not present

## 2018-11-01 DIAGNOSIS — C9110 Chronic lymphocytic leukemia of B-cell type not having achieved remission: Secondary | ICD-10-CM

## 2018-11-01 LAB — CMP (CANCER CENTER ONLY)
ALT: 24 U/L (ref 0–44)
AST: 20 U/L (ref 15–41)
Albumin: 4.4 g/dL (ref 3.5–5.0)
Alkaline Phosphatase: 68 U/L (ref 38–126)
Anion gap: 8 (ref 5–15)
BUN: 12 mg/dL (ref 8–23)
CO2: 31 mmol/L (ref 22–32)
Calcium: 9.5 mg/dL (ref 8.9–10.3)
Chloride: 101 mmol/L (ref 98–111)
Creatinine: 1.16 mg/dL (ref 0.61–1.24)
GFR, Est AFR Am: >60 mL/min (ref >60)
GFR, Estimated: >60 mL/min (ref >60)
Glucose, Bld: 266 mg/dL — ABNORMAL HIGH (ref 70–99)
Potassium: 3.9 mmol/L (ref 3.5–5.1)
Sodium: 140 mmol/L (ref 135–145)
Total Bilirubin: 0.5 mg/dL (ref 0.3–1.2)
Total Protein: 6.9 g/dL (ref 6.5–8.1)

## 2018-11-01 LAB — CBC WITH DIFFERENTIAL (CANCER CENTER ONLY)
Abs Immature Granulocytes: 0.04 10*3/uL (ref 0.00–0.07)
Basophils Absolute: 0.1 10*3/uL (ref 0.0–0.1)
Basophils Relative: 1 %
Eosinophils Absolute: 0.1 10*3/uL (ref 0.0–0.5)
Eosinophils Relative: 1 %
HCT: 39.9 % (ref 39.0–52.0)
Hemoglobin: 14.2 g/dL (ref 13.0–17.0)
Immature Granulocytes: 0 %
Lymphocytes Relative: 49 %
Lymphs Abs: 4.8 10*3/uL — ABNORMAL HIGH (ref 0.7–4.0)
MCH: 31.3 pg (ref 26.0–34.0)
MCHC: 35.6 g/dL (ref 30.0–36.0)
MCV: 87.9 fL (ref 80.0–100.0)
Monocytes Absolute: 0.6 10*3/uL (ref 0.1–1.0)
Monocytes Relative: 7 %
Neutro Abs: 4 10*3/uL (ref 1.7–7.7)
Neutrophils Relative %: 42 %
Platelet Count: 178 10*3/uL (ref 150–400)
RBC: 4.54 MIL/uL (ref 4.22–5.81)
RDW: 12.5 % (ref 11.5–15.5)
WBC Count: 9.6 10*3/uL (ref 4.0–10.5)
nRBC: 0 % (ref 0.0–0.2)

## 2018-11-01 LAB — CULTURE, BLOOD (ROUTINE X 2)
Culture: NO GROWTH
Culture: NO GROWTH
Special Requests: ADEQUATE
Special Requests: ADEQUATE

## 2018-11-01 LAB — URIC ACID: Uric Acid, Serum: 5.2 mg/dL (ref 3.7–8.6)

## 2018-11-01 LAB — SAVE SMEAR(SSMR), FOR PROVIDER SLIDE REVIEW

## 2018-11-01 MED ORDER — FAMCICLOVIR 500 MG PO TABS: 500.0000 mg | ORAL_TABLET | Freq: Every day | ORAL | 8 refills | Status: DC

## 2018-11-01 NOTE — Progress Notes (Signed)
Hematology and Oncology Follow Up Visit  Ronald Rice [id number] 1945/07/18 73 y.o. 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  Unfortunately, things have changed quite significantly for him.  I actually saw him in the hospital last Friday when I was on call.  He was admitted because of a painful swollen lymph node in the left neck.  He subsequently underwent scans.  He had significant adenopathy from the neck down to the pelvis.  In the neck, he had a large lymph node in the left neck measuring 4.6 x 5.5 cm.  There are other lymph nodes that were noted.  The lymph nodes in the axilla and mediastinum.  Also noted were some lymph nodes in Ronald abdomen.  He had a biopsy of the left lateral neck lymph node.  The pathology report ([id number]) showed a diffuse large cell non-Hodgkin lymphoma.  This was relatively high-grade.  He was subsequently discharged over the weekend.  He has not had any additional studies.  Otherwise, he is doing okay.  He actually had a fractured left ankle.  This was from a fall.  He had surgery to fix this on 09/13/2018.  He has diabetes.  Ronald blood sugar is on the high side today.  He says at home he checks Ronald blood sugars and they are not as high.    Have to get him set up with a PET scan.  Ronald labs look okay so I do not think we really need to have a bone marrow biopsy done.  He has had no fever.  He has had no obvious weight loss.  Ronald appetite is doing okay.  There is no change in bowel or bladder habits.  Overall, Ronald performance status is ECOG 1.     Medications:  Current Outpatient Medications:  .  acetaminophen (TYLENOL) 500 MG tablet, Take 500 mg by mouth every 6 (six) hours as needed (pain)., Disp: , Rfl:  .  allopurinol (ZYLOPRIM) 300 MG tablet, Take 300 mg by  mouth daily., Disp: , Rfl:  .  aspirin EC 81 MG tablet, Take 81 mg by mouth daily., Disp: , Rfl:  .  atenolol (TENORMIN) 25 MG tablet, Take 25 mg by mouth daily. , Disp: , Rfl:  .  atorvastatin (LIPITOR) 40 MG tablet, Take 40 mg by mouth daily., Disp: , Rfl:  .  CIALIS 20 MG tablet, Take 20 mg by mouth as needed for erectile dysfunction., Disp: , Rfl: 6 .  Cinnamon 500 MG TABS, Take 500 mg by mouth daily., Disp: , Rfl:  .  diclofenac sodium (VOLTAREN) 1 % GEL, Apply 1 application topically as needed., Disp: , Rfl:  .  famotidine (PEPCID) 20 MG tablet, Take 20 mg by mouth at bedtime as needed for heartburn. , Disp: , Rfl:  .  HYDROcodone-acetaminophen (NORCO/VICODIN) 5-325 MG tablet, Take 1-2 tablets by mouth every 6 (six) hours as needed for moderate pain., Disp: 30 tablet, Rfl: 0 .  ketoconazole (NIZORAL) 2 % cream, Apply 1 application topically as needed for irritation., Disp: , Rfl: 0 .  lisinopril (ZESTRIL) 40 MG tablet, Take 1 tablet (40 mg total) by mouth daily for 30 days., Disp: 30 tablet, Rfl: 0 .  NON FORMULARY, Take 1 tablet by mouth daily. Blood Sugar Defense , Disp: , Rfl:  .  potassium chloride (K-DUR) 10 MEQ tablet, Take 10 mEq by mouth daily. , Disp: , Rfl:  .  tamsulosin (FLOMAX) 0.4 MG CAPS capsule, Take 0.4 mg by mouth daily., Disp: , Rfl:  .  tiZANidine (ZANAFLEX) 4 MG tablet, Take 1 tablet (4 mg total) by mouth every 12 (twelve) hours as needed for muscle spasms., Disp: 30 tablet, Rfl: 0 .  traMADol (ULTRAM) 50 MG tablet, Take 1 tablet (50 mg total) by mouth every 6 (six) hours as needed., Disp: 60 tablet, Rfl: 0 .  aspirin EC 325 MG tablet, Take 1 tablet (325 mg total) by mouth daily. Take with food. Take x 1 month post op to decrease risk of blood clots. (Patient taking differently: Take 81 mg by mouth daily. ), Disp: 30 tablet, Rfl: 0 .  famciclovir (FAMVIR) 500 MG tablet, Take 1 tablet (500 mg total) by mouth daily., Disp: 30 tablet, Rfl: 8 .  ibuprofen (ADVIL) 800 MG  tablet, Take 800 mg by mouth every 8 (eight) hours as needed (pain)., Disp: , Rfl:   Allergies:  No Known Allergies  Past Medical History, Surgical history, Social history, and Family History were reviewed and updated.  Review of Systems: Review of Systems  Constitutional: Negative.   HENT:  Negative.   Eyes: Negative.   Respiratory: Negative.   Cardiovascular: Negative.   Gastrointestinal: Negative.   Endocrine: Negative.   Genitourinary: Positive for nocturia.   Musculoskeletal: Positive for arthralgias, gait problem and myalgias.  Neurological: Positive for gait problem.  Hematological: Negative.   Psychiatric/Behavioral: Negative.     Physical Exam:  weight is 286 lb 1.9 oz (129.8 kg). Ronald oral temperature is 98.7 F (37.1 C). Ronald blood pressure is 153/99 (abnormal) and Ronald pulse is 83. Ronald respiration is 20 and oxygen saturation is 99%.   Wt Readings from Last 3 Encounters:  11/01/18 286 lb 1.9 oz (129.8 kg)  10/28/18 295 lb 6.7 oz (134 kg)  10/24/18 280 lb (127 kg)    Physical Exam Vitals signs reviewed.  HENT:     Head: Normocephalic and atraumatic.  Eyes:     Pupils: Pupils are equal, round, and reactive to light.  Neck:     Comments: Examination of Ronald neck shows a large lymph node in the left neck.  This is somewhat firm.  It is not mobile.  It probably measures about 6 x 6 cm.  I cannot palpate anything on the right side of the neck. Cardiovascular:     Rate and Rhythm: Normal rate and regular rhythm.     Heart sounds: Normal heart sounds.  Pulmonary:     Effort: Pulmonary effort is normal.     Breath sounds: Normal breath sounds.  Abdominal:     General: Bowel sounds are normal.     Palpations: Abdomen is soft.  Musculoskeletal: Normal range of motion.        General: No tenderness or deformity.  Lymphadenopathy:     Cervical: No cervical adenopathy.  Skin:    General: Skin is warm and dry.     Findings: No erythema or rash.  Neurological:      Mental Status: He is alert and oriented to person, place, and time.  Psychiatric:        Behavior: Behavior normal.        Thought Content: Thought content normal.        Judgment: Judgment normal.      Lab Results  Component Value Date   WBC 9.6  11/01/2018   HGB 14.2 11/01/2018   HCT 39.9 11/01/2018   MCV 87.9 11/01/2018   PLT 178 11/01/2018     Chemistry      Component Value Date/Time   NA 140 11/01/2018 1044   NA 142 01/25/2017 1217   K 3.9 11/01/2018 1044   K 4.1 01/25/2017 1217   CL 101 11/01/2018 1044   CL 102 01/25/2017 1217   CO2 31 11/01/2018 1044   CO2 33 01/25/2017 1217   BUN 12 11/01/2018 1044   BUN 12 01/25/2017 1217   CREATININE 1.16 11/01/2018 1044   CREATININE 1.4 (H) 01/25/2017 1217      Component Value Date/Time   CALCIUM 9.5 11/01/2018 1044   CALCIUM 9.7 01/25/2017 1217   ALKPHOS 68 11/01/2018 1044   ALKPHOS 59 01/25/2017 1217   AST 20 11/01/2018 1044   ALT 24 11/01/2018 1044   ALT 32 01/25/2017 1217   BILITOT 0.5 11/01/2018 1044         Impression and Plan: Ronald Rice is a 73 year old African-American male.  He was diagnosed with CLL back in [place].  Unfortunately, he now has transformed into a large cell lymphoma.  We are going to have to treat this aggressively.  I will have to see about getting him on treatment with R-CHOP.  I think this would be considered standard therapy.  He is already on allopurinol so we do not need to prescribe this.  I will call in some Famvir for him.  Ronald labs all look all that bad.  He does not have a high LDH.  I would be surprised by this.  I would have thought given the path report of the LDH would be a lot higher.  Ronald IgG is slightly depressed at 572 mg/dL.  I do not think we have to give any IVIG for right now.  We spent an hour with him.  He had chemotherapy teaching.  He had information sheets about all the chemotherapy drugs.  I worry about the prednisone and Ronald diabetes.  He actually may need  to have insulin at some point for Ronald blood sugars.  We will try to get a PET scan on him on Monday.  If we cannot get the PET scan before Ronald treatment, this would still be okay.  We also need to have a Port-A-Cath put into him.  I think this is going to be essential.  I suspect that he will need 8 cycles of treatment.  We will plan for 8 cycles of R-CHOP.  After the second cycle, we will see what Ronald PET scan shows and hopefully we will find that he has responded and is in a remission or near complete remission.  This is quite complicated.  There is a lot of factors that we have to take into consideration.  He does have other health issues.  Of note, when he was in the hospital we did do an echocardiogram on him.  Ronald left ventricular ejection fraction was 60-65%.  We answered all of Ronald questions.  He came in with Ronald Rice who is very attentive.  I will start treatment next week.  I will plan to see him back for cycle #2 of treatment.  He will need Neulasta.  I probably will also need to get him on some oral antibiotics to help with neutropenia.   Volanda Napoleon, MD 6/10/20205:13 PM

## 2018-11-01 NOTE — Progress Notes (Unsigned)
Patient in chemotherapy education class with wife.  Discussed side effects of Rituxan, Cytoxan, Adriamycin and Prednisone   which include but are not limited to myelosuppression, decreased appetite, fatigue, fever, allergic or infusional reaction, mucositis, cardiac toxicity, cough, SOB, altered taste, nausea and vomiting, diarrhea, constipation, elevated LFTs myalgia and arthralgias, hair loss or thinning, rash, skin dryness, nail changes, peripheral neuropathy, discolored urine, delayed wound healing, mental changes (Chemo brain), increased risk of infections, weight loss.  Reviewed infusion room and office policy and procedure and phone numbers 24 hours x 7 days a week.  Reviewed when to call the office with any concerns or problems.  Scientist, clinical (histocompatibility and immunogenetics) given.  Discussed portacath insertion and EMLA cream administration.  Antiemetic protocol and chemotherapy schedule reviewed. Patient verbalized understanding of chemotherapy indications and possible side effects.  Teachback done

## 2018-11-01 NOTE — Telephone Encounter (Signed)
Spoke with patient and wife to confirm infusion appt 6/17 at 0800 per 6/10 los. Radiology to sch port and PET appt

## 2018-11-01 NOTE — Progress Notes (Signed)
START ON PATHWAY REGIMEN - Lymphoma and CLL     A cycle is every 21 days:     Prednisone      Rituximab-xxxx      Cyclophosphamide      Doxorubicin      Vincristine   **Always confirm dose/schedule in your pharmacy ordering system**  Patient Characteristics: Diffuse Large B-Cell Lymphoma or Follicular Lymphoma, Grade 3B, First Line, Stage III and IV Disease Type: Not Applicable Disease Type: Diffuse Large B-Cell Lymphoma Disease Type: Not Applicable Line of therapy: First Line [place] Stage: III Intent of Therapy: Curative Intent, Discussed with Patient 

## 2018-11-02 ENCOUNTER — Other Ambulatory Visit: Payer: Self-pay | Admitting: *Deleted

## 2018-11-02 ENCOUNTER — Ambulatory Visit: Payer: Self-pay | Admitting: Family Medicine

## 2018-11-02 DIAGNOSIS — I10 Essential (primary) hypertension: Secondary | ICD-10-CM | POA: Diagnosis not present

## 2018-11-02 DIAGNOSIS — C8331 Diffuse large B-cell lymphoma, lymph nodes of head, face, and neck: Secondary | ICD-10-CM | POA: Diagnosis not present

## 2018-11-02 DIAGNOSIS — C8338 Diffuse large B-cell lymphoma, lymph nodes of multiple sites: Secondary | ICD-10-CM

## 2018-11-02 LAB — HEPATITIS PANEL, ACUTE
HCV Ab: <0.1 s/co ratio (ref 0.0–0.9)
Hep A IgM: NEGATIVE
Hep B C IgM: NEGATIVE
Hepatitis B Surface Ag: NEGATIVE

## 2018-11-02 LAB — LACTATE DEHYDROGENASE: LDH: 243 U/L — ABNORMAL HIGH (ref 98–192)

## 2018-11-02 MED ORDER — LIDOCAINE-PRILOCAINE 2.5-2.5 % EX CREA: TOPICAL_CREAM | CUTANEOUS | 3 refills | Status: DC

## 2018-11-02 MED ORDER — LORAZEPAM 0.5 MG PO TABS: 0.5000 mg | ORAL_TABLET | Freq: Four times a day (QID) | ORAL | 0 refills | Status: DC | PRN

## 2018-11-02 MED ORDER — PROCHLORPERAZINE MALEATE 10 MG PO TABS: 10.0000 mg | ORAL_TABLET | Freq: Four times a day (QID) | ORAL | 6 refills | Status: DC | PRN

## 2018-11-02 MED ORDER — PREDNISONE 20 MG PO TABS: 60.0000 mg | ORAL_TABLET | Freq: Every day | ORAL | 6 refills | Status: DC

## 2018-11-02 MED ORDER — ONDANSETRON HCL 8 MG PO TABS: 8.0000 mg | ORAL_TABLET | Freq: Two times a day (BID) | ORAL | 1 refills | Status: DC | PRN

## 2018-11-05 DIAGNOSIS — G4733 Obstructive sleep apnea (adult) (pediatric): Secondary | ICD-10-CM | POA: Diagnosis not present

## 2018-11-06 ENCOUNTER — Ambulatory Visit: Payer: Medicare HMO | Admitting: Hematology & Oncology

## 2018-11-06 ENCOUNTER — Inpatient Hospital Stay: Payer: Medicare HMO

## 2018-11-07 ENCOUNTER — Ambulatory Visit (HOSPITAL_COMMUNITY)
Admission: RE | Admit: 2018-11-07 | Discharge: 2018-11-07 | Disposition: A | Discharge status: Home / Self Care | Payer: Medicare HMO | Source: Ambulatory Visit | Attending: Hematology & Oncology | Admitting: Hematology & Oncology

## 2018-11-07 ENCOUNTER — Other Ambulatory Visit: Payer: Self-pay

## 2018-11-07 DIAGNOSIS — C8330 Diffuse large B-cell lymphoma, unspecified site: Secondary | ICD-10-CM | POA: Diagnosis not present

## 2018-11-07 DIAGNOSIS — R590 Localized enlarged lymph nodes: Secondary | ICD-10-CM | POA: Diagnosis not present

## 2018-11-07 DIAGNOSIS — N281 Cyst of kidney, acquired: Secondary | ICD-10-CM | POA: Diagnosis not present

## 2018-11-07 DIAGNOSIS — Z79899 Other long term (current) drug therapy: Secondary | ICD-10-CM | POA: Diagnosis not present

## 2018-11-07 DIAGNOSIS — C8338 Diffuse large B-cell lymphoma, lymph nodes of multiple sites: Secondary | ICD-10-CM | POA: Insufficient documentation

## 2018-11-07 DIAGNOSIS — K769 Liver disease, unspecified: Secondary | ICD-10-CM | POA: Diagnosis not present

## 2018-11-07 DIAGNOSIS — R918 Other nonspecific abnormal finding of lung field: Secondary | ICD-10-CM | POA: Diagnosis not present

## 2018-11-07 LAB — GLUCOSE, CAPILLARY: Glucose-Capillary: 175 mg/dL — ABNORMAL HIGH (ref 70–99)

## 2018-11-07 MED ORDER — FLUDEOXYGLUCOSE F - 18 (FDG) INJECTION
14.3100 | Freq: Once | INTRAVENOUS | Status: AC | PRN
  Administered 2018-11-07: 14.31 via INTRAVENOUS

## 2018-11-08 ENCOUNTER — Inpatient Hospital Stay: Payer: Medicare HMO

## 2018-11-08 ENCOUNTER — Encounter: Payer: Self-pay | Admitting: Pharmacist

## 2018-11-08 ENCOUNTER — Other Ambulatory Visit: Payer: Self-pay

## 2018-11-08 ENCOUNTER — Other Ambulatory Visit: Payer: Self-pay | Admitting: Radiology

## 2018-11-08 VITALS — BP 141/85 | HR 96 | Temp 98.6°F | Resp 18

## 2018-11-08 DIAGNOSIS — Z5112 Encounter for antineoplastic immunotherapy: Secondary | ICD-10-CM | POA: Diagnosis not present

## 2018-11-08 DIAGNOSIS — Z5189 Encounter for other specified aftercare: Secondary | ICD-10-CM | POA: Diagnosis not present

## 2018-11-08 DIAGNOSIS — Z5111 Encounter for antineoplastic chemotherapy: Secondary | ICD-10-CM | POA: Diagnosis not present

## 2018-11-08 DIAGNOSIS — C8338 Diffuse large B-cell lymphoma, lymph nodes of multiple sites: Secondary | ICD-10-CM

## 2018-11-08 DIAGNOSIS — E119 Type 2 diabetes mellitus without complications: Secondary | ICD-10-CM | POA: Diagnosis not present

## 2018-11-08 DIAGNOSIS — Z791 Long term (current) use of non-steroidal anti-inflammatories (NSAID): Secondary | ICD-10-CM | POA: Diagnosis not present

## 2018-11-08 DIAGNOSIS — Z79899 Other long term (current) drug therapy: Secondary | ICD-10-CM | POA: Diagnosis not present

## 2018-11-08 DIAGNOSIS — Z7982 Long term (current) use of aspirin: Secondary | ICD-10-CM | POA: Diagnosis not present

## 2018-11-08 LAB — CBC WITH DIFFERENTIAL (CANCER CENTER ONLY)
Abs Immature Granulocytes: 0.02 10*3/uL (ref 0.00–0.07)
Basophils Absolute: 0 10*3/uL (ref 0.0–0.1)
Basophils Relative: 0 %
Eosinophils Absolute: 0.1 10*3/uL (ref 0.0–0.5)
Eosinophils Relative: 1 %
HCT: 38.1 % — ABNORMAL LOW (ref 39.0–52.0)
Hemoglobin: 13.3 g/dL (ref 13.0–17.0)
Immature Granulocytes: 0 %
Lymphocytes Relative: 59 %
Lymphs Abs: 4.8 10*3/uL — ABNORMAL HIGH (ref 0.7–4.0)
MCH: 30.9 pg (ref 26.0–34.0)
MCHC: 34.9 g/dL (ref 30.0–36.0)
MCV: 88.4 fL (ref 80.0–100.0)
Monocytes Absolute: 0.5 10*3/uL (ref 0.1–1.0)
Monocytes Relative: 6 %
Neutro Abs: 2.8 10*3/uL (ref 1.7–7.7)
Neutrophils Relative %: 34 %
Platelet Count: 178 10*3/uL (ref 150–400)
RBC: 4.31 MIL/uL (ref 4.22–5.81)
RDW: 12.4 % (ref 11.5–15.5)
WBC Count: 8.3 10*3/uL (ref 4.0–10.5)
nRBC: 0 % (ref 0.0–0.2)

## 2018-11-08 LAB — CMP (CANCER CENTER ONLY)
ALT: 20 U/L (ref 0–44)
AST: 19 U/L (ref 15–41)
Albumin: 4.1 g/dL (ref 3.5–5.0)
Alkaline Phosphatase: 75 U/L (ref 38–126)
Anion gap: 9 (ref 5–15)
BUN: 11 mg/dL (ref 8–23)
CO2: 27 mmol/L (ref 22–32)
Calcium: 8.9 mg/dL (ref 8.9–10.3)
Chloride: 105 mmol/L (ref 98–111)
Creatinine: 1.12 mg/dL (ref 0.61–1.24)
GFR, Est AFR Am: >60 mL/min (ref >60)
GFR, Estimated: >60 mL/min (ref >60)
Glucose, Bld: 243 mg/dL — ABNORMAL HIGH (ref 70–99)
Potassium: 4.2 mmol/L (ref 3.5–5.1)
Sodium: 141 mmol/L (ref 135–145)
Total Bilirubin: 0.6 mg/dL (ref 0.3–1.2)
Total Protein: 6.1 g/dL — ABNORMAL LOW (ref 6.5–8.1)

## 2018-11-08 LAB — LACTATE DEHYDROGENASE: LDH: 235 U/L — ABNORMAL HIGH (ref 98–192)

## 2018-11-08 LAB — URIC ACID: Uric Acid, Serum: 5.3 mg/dL (ref 3.7–8.6)

## 2018-11-08 MED ORDER — SODIUM CHLORIDE 0.9 % IV SOLN
750.0000 mg/m2 | Freq: Once | INTRAVENOUS | Status: AC
  Administered 2018-11-08: 16:00:00 1960 mg via INTRAVENOUS
  Filled 2018-11-08: qty 98

## 2018-11-08 MED ORDER — SODIUM CHLORIDE 0.9 % IV SOLN
375.0000 mg/m2 | Freq: Once | INTRAVENOUS | Status: AC
  Administered 2018-11-08: 1000 mg via INTRAVENOUS
  Filled 2018-11-08: qty 100

## 2018-11-08 MED ORDER — DIPHENHYDRAMINE HCL 25 MG PO CAPS
50.0000 mg | ORAL_CAPSULE | Freq: Once | ORAL | Status: AC
  Administered 2018-11-08: 50 mg via ORAL

## 2018-11-08 MED ORDER — ACETAMINOPHEN 325 MG PO TABS
650.0000 mg | ORAL_TABLET | Freq: Once | ORAL | Status: AC
  Administered 2018-11-08: 650 mg via ORAL

## 2018-11-08 MED ORDER — DIPHENHYDRAMINE HCL 25 MG PO CAPS
ORAL_CAPSULE | ORAL | Status: AC
  Filled 2018-11-08: qty 2

## 2018-11-08 MED ORDER — ACETAMINOPHEN 325 MG PO TABS
ORAL_TABLET | ORAL | Status: AC
  Filled 2018-11-08: qty 2

## 2018-11-08 MED ORDER — PROCHLORPERAZINE MALEATE 10 MG PO TABS
ORAL_TABLET | ORAL | Status: AC
  Filled 2018-11-08: qty 1

## 2018-11-08 MED ORDER — PROCHLORPERAZINE MALEATE 10 MG PO TABS
10.0000 mg | ORAL_TABLET | Freq: Once | ORAL | Status: AC
  Administered 2018-11-08: 10 mg via ORAL

## 2018-11-08 MED ORDER — SODIUM CHLORIDE 0.9 % IV SOLN
Freq: Once | INTRAVENOUS | Status: AC
  Administered 2018-11-08: 09:00:00 via INTRAVENOUS
  Filled 2018-11-08: qty 250

## 2018-11-08 NOTE — Progress Notes (Signed)
Samples given:  Zuplenz 4 mg x 3 Lot:  [id number] Exp:  04/2019

## 2018-11-08 NOTE — Progress Notes (Signed)
Port-a-cath has not been placed yet. Patient will receive only Rituxan and cyclophosphamide today peripherally with prochlorperazine premedication per Dr. Marin Olp. Patient will return on Friday after port placement for doxorubicin and vincristine. Dr. Marin Olp will give prochlorperazine day 1 and Emend/dexamethasone/Aloxi with doxorubicin and vincristine. The patient has prochlorperazine at home.

## 2018-11-08 NOTE — Patient Instructions (Addendum)
[facility] Discharge Instructions for Patients Receiving Chemotherapy  Today you received the following chemotherapy agents Rituxan/Cytoxan  To help prevent nausea and vomiting after your treatment, we encourage you to take your nausea medication as prescribed.   If you develop nausea and vomiting that is not controlled by your nausea medication, call the clinic.   PROCHLORAPERAZINE (COMPAZINE) WAS GIVEN AT 2:40 PM TODAY YOU MAY TAKE ONE TABLET EVERY 6 HOURS AS NEEDED FOR NAUSEA. THE NEXT DOSE CAN BE GIVEN AT 8:40 PM.  BELOW ARE SYMPTOMS THAT SHOULD BE REPORTED IMMEDIATELY:  *FEVER GREATER THAN 100.5 F  *CHILLS WITH OR WITHOUT FEVER  NAUSEA AND VOMITING THAT IS NOT CONTROLLED WITH YOUR NAUSEA MEDICATION  *UNUSUAL SHORTNESS OF BREATH  *UNUSUAL BRUISING OR BLEEDING  TENDERNESS IN MOUTH AND THROAT WITH OR WITHOUT PRESENCE OF ULCERS  *URINARY PROBLEMS  *BOWEL PROBLEMS  UNUSUAL RASH Items with * indicate a potential emergency and should be followed up as soon as possible.  Feel free to call the clinic should you have any questions or concerns. The clinic phone number is [phone].  Please show the [facility] at check-in to the Emergency Department and triage nurse.  Rituximab injection (Rituxan) What is this medicine? RITUXIMAB (ri TUX i mab) is a monoclonal antibody. It is used to treat certain types of cancer like non-Hodgkin lymphoma and chronic lymphocytic leukemia. It is also used to treat rheumatoid arthritis, granulomatosis with polyangiitis (or Wegener's granulomatosis), microscopic polyangiitis, and pemphigus vulgaris. This medicine may be used for other purposes; ask your health care provider or pharmacist if you have questions. COMMON BRAND NAME(S): Rituxan What should I tell my health care provider before I take this medicine? They need to know if you have any of these conditions: -heart disease -infection (especially a virus infection such  as hepatitis B, chickenpox, cold sores, or herpes) -immune system problems -irregular heartbeat -kidney disease -low blood counts, like low white cell, platelet, or red cell counts -lung or breathing disease, like asthma -recently received or scheduled to receive a vaccine -an unusual or allergic reaction to rituximab, other medicines, foods, dyes, or preservatives -pregnant or trying to get pregnant -breast-feeding How should I use this medicine? This medicine is for infusion into a vein. It is administered in a hospital or clinic by a specially trained health care professional. A special MedGuide will be given to you by the pharmacist with each prescription and refill. Be sure to read this information carefully each time. Talk to your pediatrician regarding the use of this medicine in children. This medicine is not approved for use in children. Overdosage: If you think you have taken too much of this medicine contact a poison control center or emergency room at once. NOTE: This medicine is only for you. Do not share this medicine with others. What if I miss a dose? It is important not to miss a dose. Call your doctor or health care professional if you are unable to keep an appointment. What may interact with this medicine? -cisplatin -live virus vaccines This list may not describe all possible interactions. Give your health care provider a list of all the medicines, herbs, non-prescription drugs, or dietary supplements you use. Also tell them if you smoke, drink alcohol, or use illegal drugs. Some items may interact with your medicine. What should I watch for while using this medicine? Your condition will be monitored carefully while you are receiving this medicine. You may need blood work done while you  are taking this medicine. This medicine can cause serious allergic reactions. To reduce your risk you may need to take medicine before treatment with this medicine. Take your medicine as  directed. In some patients, this medicine may cause a serious brain infection that may cause death. If you have any problems seeing, thinking, speaking, walking, or standing, tell your healthcare professional right away. If you cannot reach your healthcare professional, urgently seek other source of medical care. Call your doctor or health care professional for advice if you get a fever, chills or sore throat, or other symptoms of a cold or flu. Do not treat yourself. This drug decreases your body's ability to fight infections. Try to avoid being around people who are sick. Do not become pregnant while taking this medicine or for 12 months after stopping it. Women should inform their doctor if they wish to become pregnant or think they might be pregnant. There is a potential for serious side effects to an unborn child. Talk to your health care professional or pharmacist for more information. Do not breast-feed an infant while taking this medicine or for 6 months after stopping it. What side effects may I notice from receiving this medicine? Side effects that you should report to your doctor or health care professional as soon as possible: -allergic reactions like skin rash, itching or hives; swelling of the face, lips, or tongue -breathing problems -chest pain -changes in vision -diarrhea -headache with fever, neck stiffness, sensitivity to light, nausea, or confusion -fast, irregular heartbeat -loss of memory -low blood counts - this medicine may decrease the number of white blood cells, red blood cells and platelets. You may be at increased risk for infections and bleeding. -mouth sores -problems with balance, talking, or walking -redness, blistering, peeling or loosening of the skin, including inside the mouth -signs of infection - fever or chills, cough, sore throat, pain or difficulty passing urine -signs and symptoms of kidney injury like trouble passing urine or change in the amount of  urine -signs and symptoms of liver injury like dark yellow or brown urine; general ill feeling or flu-like symptoms; light-colored stools; loss of appetite; nausea; right upper belly pain; unusually weak or tired; yellowing of the eyes or skin -signs and symptoms of low blood pressure like dizziness; feeling faint or lightheaded, falls; unusually weak or tired -stomach pain -swelling of the ankles, feet, hands -unusual bleeding or bruising -vomiting Side effects that usually do not require medical attention (report to your doctor or health care professional if they continue or are bothersome): -headache -joint pain -muscle cramps or muscle pain -nausea -tiredness This list may not describe all possible side effects. Call your doctor for medical advice about side effects. You may report side effects to FDA at 1-800-FDA-1088. Where should I keep my medicine? This drug is given in a hospital or clinic and will not be stored at home. NOTE: This sheet is a summary. It may not cover all possible information. If you have questions about this medicine, talk to your doctor, pharmacist, or health care provider.  2019 Elsevier/Gold Standard (2017-04-22 13:04:32)  Cyclophosphamide injection (Cytoxan) What is this medicine? CYCLOPHOSPHAMIDE (sye kloe FOSS fa mide) is a chemotherapy drug. It slows the growth of cancer cells. This medicine is used to treat many types of cancer like lymphoma, myeloma, leukemia, breast cancer, and ovarian cancer, to name a few. This medicine may be used for other purposes; ask your health care provider or pharmacist if you have questions. COMMON  BRAND NAME(S): Cytoxan, Neosar What should I tell my health care provider before I take this medicine? They need to know if you have any of these conditions: -blood disorders -history of other chemotherapy -infection -kidney disease -liver disease -recent or ongoing radiation therapy -tumors in the bone marrow -an unusual or  allergic reaction to cyclophosphamide, other chemotherapy, other medicines, foods, dyes, or preservatives -pregnant or trying to get pregnant -breast-feeding How should I use this medicine? This drug is usually given as an injection into a vein or muscle or by infusion into a vein. It is administered in a hospital or clinic by a specially trained health care professional. Talk to your pediatrician regarding the use of this medicine in children. Special care may be needed. Overdosage: If you think you have taken too much of this medicine contact a poison control center or emergency room at once. NOTE: This medicine is only for you. Do not share this medicine with others. What if I miss a dose? It is important not to miss your dose. Call your doctor or health care professional if you are unable to keep an appointment. What may interact with this medicine? This medicine may interact with the following medications: -amiodarone -amphotericin B -azathioprine -certain antiviral medicines for HIV or AIDS such as protease inhibitors (e.g., indinavir, ritonavir) and zidovudine -certain blood pressure medications such as benazepril, captopril, enalapril, fosinopril, lisinopril, moexipril, monopril, perindopril, quinapril, ramipril, trandolapril -certain cancer medications such as anthracyclines (e.g., daunorubicin, doxorubicin), busulfan, cytarabine, paclitaxel, pentostatin, tamoxifen, trastuzumab -certain diuretics such as chlorothiazide, chlorthalidone, hydrochlorothiazide, indapamide, metolazone -certain medicines that treat or prevent blood clots like warfarin -certain muscle relaxants such as succinylcholine -cyclosporine -etanercept -indomethacin -medicines to increase blood counts like filgrastim, pegfilgrastim, sargramostim -medicines used as general anesthesia -metronidazole -natalizumab This list may not describe all possible interactions. Give your health care provider a list of all the  medicines, herbs, non-prescription drugs, or dietary supplements you use. Also tell them if you smoke, drink alcohol, or use illegal drugs. Some items may interact with your medicine. What should I watch for while using this medicine? Visit your doctor for checks on your progress. This drug may make you feel generally unwell. This is not uncommon, as chemotherapy can affect healthy cells as well as cancer cells. Report any side effects. Continue your course of treatment even though you feel ill unless your doctor tells you to stop. Drink water or other fluids as directed. Urinate often, even at night. In some cases, you may be given additional medicines to help with side effects. Follow all directions for their use. Call your doctor or health care professional for advice if you get a fever, chills or sore throat, or other symptoms of a cold or flu. Do not treat yourself. This drug decreases your body's ability to fight infections. Try to avoid being around people who are sick. This medicine may increase your risk to bruise or bleed. Call your doctor or health care professional if you notice any unusual bleeding. Be careful brushing and flossing your teeth or using a toothpick because you may get an infection or bleed more easily. If you have any dental work done, tell your dentist you are receiving this medicine. You may get drowsy or dizzy. Do not drive, use machinery, or do anything that needs mental alertness until you know how this medicine affects you. Do not become pregnant while taking this medicine or for 1 year after stopping it. Women should inform their doctor if they wish  to become pregnant or think they might be pregnant. Men should not father a child while taking this medicine and for 4 months after stopping it. There is a potential for serious side effects to an unborn child. Talk to your health care professional or pharmacist for more information. Do not breast-feed an infant while taking  this medicine. This medicine may interfere with the ability to have a child. This medicine has caused ovarian failure in some women. This medicine has caused reduced sperm counts in some men. You should talk with your doctor or health care professional if you are concerned about your fertility. If you are going to have surgery, tell your doctor or health care professional that you have taken this medicine. What side effects may I notice from receiving this medicine? Side effects that you should report to your doctor or health care professional as soon as possible: -allergic reactions like skin rash, itching or hives, swelling of the face, lips, or tongue -low blood counts - this medicine may decrease the number of white blood cells, red blood cells and platelets. You may be at increased risk for infections and bleeding. -signs of infection - fever or chills, cough, sore throat, pain or difficulty passing urine -signs of decreased platelets or bleeding - bruising, pinpoint red spots on the skin, black, tarry stools, blood in the urine -signs of decreased red blood cells - unusually weak or tired, fainting spells, lightheadedness -breathing problems -dark urine -dizziness -palpitations -swelling of the ankles, feet, hands -trouble passing urine or change in the amount of urine -weight gain -yellowing of the eyes or skin Side effects that usually do not require medical attention (report to your doctor or health care professional if they continue or are bothersome): -changes in nail or skin color -hair loss -missed menstrual periods -mouth sores -nausea, vomiting This list may not describe all possible side effects. Call your doctor for medical advice about side effects. You may report side effects to FDA at 1-800-FDA-1088. Where should I keep my medicine? This drug is given in a hospital or clinic and will not be stored at home. NOTE: This sheet is a summary. It may not cover all possible  information. If you have questions about this medicine, talk to your doctor, pharmacist, or health care provider.  2019 Elsevier/Gold Standard (2012-03-24 16:22:58)

## 2018-11-09 ENCOUNTER — Telehealth: Payer: Self-pay | Admitting: *Deleted

## 2018-11-09 ENCOUNTER — Other Ambulatory Visit: Payer: Self-pay | Admitting: Radiology

## 2018-11-09 NOTE — Telephone Encounter (Signed)
Message received from patient's wife stating that pt did not take Prednisone yesterday and would like a call back.  Call placed back to patient's wife and informed pt.'s wife to have pt start Prednisone today and to be sure to take it with food.  Teach back done.  Dr. Marin Olp notified.  Pt.'s schedule reviewed with pt.'s wife.

## 2018-11-10 ENCOUNTER — Inpatient Hospital Stay: Payer: Medicare HMO

## 2018-11-10 ENCOUNTER — Ambulatory Visit (HOSPITAL_COMMUNITY)
Admission: RE | Admit: 2018-11-10 | Discharge: 2018-11-10 | Disposition: A | Discharge status: Home / Self Care | Payer: Medicare HMO | Source: Ambulatory Visit | Attending: Hematology & Oncology | Admitting: Hematology & Oncology

## 2018-11-10 ENCOUNTER — Encounter (HOSPITAL_COMMUNITY): Payer: Self-pay

## 2018-11-10 ENCOUNTER — Ambulatory Visit: Payer: Medicare HMO

## 2018-11-10 ENCOUNTER — Other Ambulatory Visit: Payer: Self-pay

## 2018-11-10 ENCOUNTER — Other Ambulatory Visit: Payer: Self-pay | Admitting: Hematology & Oncology

## 2018-11-10 VITALS — BP 150/91 | HR 93 | Temp 98.3°F | Resp 18

## 2018-11-10 DIAGNOSIS — Z7982 Long term (current) use of aspirin: Secondary | ICD-10-CM | POA: Diagnosis not present

## 2018-11-10 DIAGNOSIS — E119 Type 2 diabetes mellitus without complications: Secondary | ICD-10-CM | POA: Diagnosis not present

## 2018-11-10 DIAGNOSIS — C8330 Diffuse large B-cell lymphoma, unspecified site: Secondary | ICD-10-CM | POA: Diagnosis not present

## 2018-11-10 DIAGNOSIS — I2510 Atherosclerotic heart disease of native coronary artery without angina pectoris: Secondary | ICD-10-CM | POA: Insufficient documentation

## 2018-11-10 DIAGNOSIS — Z452 Encounter for adjustment and management of vascular access device: Secondary | ICD-10-CM | POA: Diagnosis not present

## 2018-11-10 DIAGNOSIS — Z79899 Other long term (current) drug therapy: Secondary | ICD-10-CM | POA: Diagnosis not present

## 2018-11-10 DIAGNOSIS — G4730 Sleep apnea, unspecified: Secondary | ICD-10-CM | POA: Diagnosis not present

## 2018-11-10 DIAGNOSIS — C8338 Diffuse large B-cell lymphoma, lymph nodes of multiple sites: Secondary | ICD-10-CM

## 2018-11-10 DIAGNOSIS — I10 Essential (primary) hypertension: Secondary | ICD-10-CM | POA: Insufficient documentation

## 2018-11-10 DIAGNOSIS — K219 Gastro-esophageal reflux disease without esophagitis: Secondary | ICD-10-CM | POA: Diagnosis not present

## 2018-11-10 DIAGNOSIS — C9110 Chronic lymphocytic leukemia of B-cell type not having achieved remission: Secondary | ICD-10-CM | POA: Diagnosis not present

## 2018-11-10 DIAGNOSIS — Z5111 Encounter for antineoplastic chemotherapy: Secondary | ICD-10-CM | POA: Diagnosis not present

## 2018-11-10 HISTORY — PX: IR IMAGING GUIDED PORT INSERTION: IMG5740

## 2018-11-10 LAB — CBC WITH DIFFERENTIAL/PLATELET
Abs Immature Granulocytes: 0.02 10*3/uL (ref 0.00–0.07)
Basophils Absolute: 0 10*3/uL (ref 0.0–0.1)
Basophils Relative: 0 %
Eosinophils Absolute: 0 10*3/uL (ref 0.0–0.5)
Eosinophils Relative: 0 %
HCT: 38.2 % — ABNORMAL LOW (ref 39.0–52.0)
Hemoglobin: 13.5 g/dL (ref 13.0–17.0)
Immature Granulocytes: 0 %
Lymphocytes Relative: 33 %
Lymphs Abs: 2.5 10*3/uL (ref 0.7–4.0)
MCH: 31.2 pg (ref 26.0–34.0)
MCHC: 35.3 g/dL (ref 30.0–36.0)
MCV: 88.2 fL (ref 80.0–100.0)
Monocytes Absolute: 0.9 10*3/uL (ref 0.1–1.0)
Monocytes Relative: 12 %
Neutro Abs: 4.1 10*3/uL (ref 1.7–7.7)
Neutrophils Relative %: 55 %
Platelets: 199 10*3/uL (ref 150–400)
RBC: 4.33 MIL/uL (ref 4.22–5.81)
RDW: 12.7 % (ref 11.5–15.5)
WBC: 7.6 10*3/uL (ref 4.0–10.5)
nRBC: 0 % (ref 0.0–0.2)

## 2018-11-10 LAB — PROTIME-INR
INR: 0.9 (ref 0.8–1.2)
Prothrombin Time: 12.5 seconds (ref 11.4–15.2)

## 2018-11-10 MED ORDER — FENTANYL CITRATE (PF) 100 MCG/2ML IJ SOLN
INTRAMUSCULAR | Status: AC
  Filled 2018-11-10: qty 2

## 2018-11-10 MED ORDER — MIDAZOLAM HCL 2 MG/2ML IJ SOLN
INTRAMUSCULAR | Status: DC | PRN
  Administered 2018-11-10 (×4): 1 mg via INTRAVENOUS

## 2018-11-10 MED ORDER — LIDOCAINE HCL (PF) 1 % IJ SOLN
INTRAMUSCULAR | Status: DC | PRN
  Administered 2018-11-10 (×2): 10 mL

## 2018-11-10 MED ORDER — VINCRISTINE SULFATE CHEMO INJECTION 1 MG/ML
2.0000 mg | Freq: Once | INTRAVENOUS | Status: AC
  Administered 2018-11-10: 2 mg via INTRAVENOUS
  Filled 2018-11-10: qty 2

## 2018-11-10 MED ORDER — SODIUM CHLORIDE 0.9% FLUSH
10.0000 mL | Freq: Once | INTRAVENOUS | Status: AC
  Administered 2018-11-10: 16:00:00 10 mL
  Filled 2018-11-10: qty 10

## 2018-11-10 MED ORDER — HEPARIN SOD (PORK) LOCK FLUSH 100 UNIT/ML IV SOLN
500.0000 [IU] | Freq: Once | INTRAVENOUS | Status: AC
  Administered 2018-11-10: 16:00:00 500 [IU] via INTRAVENOUS
  Filled 2018-11-10: qty 5

## 2018-11-10 MED ORDER — SODIUM CHLORIDE 0.9 % IV SOLN
Freq: Once | INTRAVENOUS | Status: AC
  Administered 2018-11-10: 14:00:00 via INTRAVENOUS
  Filled 2018-11-10: qty 250

## 2018-11-10 MED ORDER — PALONOSETRON HCL INJECTION 0.25 MG/5ML
0.2500 mg | Freq: Once | INTRAVENOUS | Status: AC
  Administered 2018-11-10: 14:00:00 0.25 mg via INTRAVENOUS

## 2018-11-10 MED ORDER — LIDOCAINE HCL 1 % IJ SOLN
INTRAMUSCULAR | Status: AC
  Filled 2018-11-10: qty 20

## 2018-11-10 MED ORDER — MIDAZOLAM HCL 2 MG/2ML IJ SOLN
INTRAMUSCULAR | Status: AC
  Filled 2018-11-10: qty 4

## 2018-11-10 MED ORDER — SODIUM CHLORIDE 0.9 % IV SOLN
Freq: Once | INTRAVENOUS | Status: AC
  Administered 2018-11-10: 15:00:00 via INTRAVENOUS
  Filled 2018-11-10: qty 5

## 2018-11-10 MED ORDER — SODIUM CHLORIDE 0.9 % IV SOLN
INTRAVENOUS | Status: DC
  Administered 2018-11-10: 10:00:00 via INTRAVENOUS

## 2018-11-10 MED ORDER — FENTANYL CITRATE (PF) 100 MCG/2ML IJ SOLN
INTRAMUSCULAR | Status: DC | PRN
  Administered 2018-11-10 (×2): 50 ug via INTRAVENOUS

## 2018-11-10 MED ORDER — PALONOSETRON HCL INJECTION 0.25 MG/5ML
INTRAVENOUS | Status: AC
  Filled 2018-11-10: qty 5

## 2018-11-10 MED ORDER — DOXORUBICIN HCL CHEMO IV INJECTION 2 MG/ML
50.0000 mg/m2 | Freq: Once | INTRAVENOUS | Status: AC
  Administered 2018-11-10: 130 mg via INTRAVENOUS
  Filled 2018-11-10: qty 65

## 2018-11-10 MED ORDER — CEFAZOLIN SODIUM-DEXTROSE 2-4 GM/100ML-% IV SOLN: 2.0000 g | INTRAVENOUS | Status: DC

## 2018-11-10 MED ORDER — DEXTROSE 5 % IV SOLN
3.0000 g | INTRAVENOUS | Status: AC
  Administered 2018-11-10: 11:00:00 3 g via INTRAVENOUS
  Filled 2018-11-10: qty 3000
  Filled 2018-11-10: qty 3

## 2018-11-10 NOTE — Sedation Documentation (Addendum)
Patient replies, "Yes" when asked if he feels OK. Dr Kathlene Cote aware of VT.

## 2018-11-10 NOTE — Discharge Instructions (Addendum)
Implanted [facility] Guide An implanted port is a device that is placed under the skin. It is usually placed in the chest. The device can be used to give IV medicine, to take blood, or for dialysis. You may have an implanted port if:  You need IV medicine that would be irritating to the small veins in your hands or arms.  You need IV medicines, such as antibiotics, for a long period of time.  You need IV nutrition for a long period of time.  You need dialysis. Having a port means that your health care provider will not need to use the veins in your arms for these procedures. You may have fewer limitations when using a port than you would if you used other types of long-term IVs, and you will likely be able to return to normal activities after your incision heals. An implanted port has two main parts:  Reservoir. The reservoir is the part where a needle is inserted to give medicines or draw blood. The reservoir is round. After it is placed, it appears as a small, raised area under your skin.  Catheter. The catheter is a thin, flexible tube that connects the reservoir to a vein. Medicine that is inserted into the reservoir goes into the catheter and then into the vein. How is my port accessed? To access your port:  A numbing cream may be placed on the skin over the port site.  DO NOT use EMLA cream for 2 weeks after port placement as this cream will remove the surgical glue on your incision.  Your health care provider will put on a mask and sterile gloves.  The skin over your port will be cleaned carefully with a germ-killing soap and allowed to dry.  Your health care provider will gently pinch the port and insert a needle into it.  Your health care provider will check for a blood return to make sure the port is in the vein and is not clogged.  If your port needs to remain accessed to get medicine continuously (constant infusion), your health care provider will place a clear bandage  (dressing) over the needle site. The dressing and needle will need to be changed every week, or as told by your health care provider. What is flushing? Flushing helps keep the port from getting clogged. Follow instructions from your health care provider about how and when to flush the port. Ports are usually flushed with saline solution or a medicine called heparin. The need for flushing will depend on how the port is used:  If the port is only used from time to time to give medicines or draw blood, the port may need to be flushed: ? Before and after medicines have been given. ? Before and after blood has been drawn. ? As part of routine maintenance. Flushing may be recommended every 4-6 weeks.  If a constant infusion is running, the port may not need to be flushed.  Throw away any syringes in a disposal container that is meant for sharp items (sharps container). You can buy a sharps container from a pharmacy, or you can make one by using an empty hard plastic bottle with a cover. How long will my port stay implanted? The port can stay in for as long as your health care provider thinks it is needed. When it is time for the port to come out, a surgery will be done to remove it. The surgery will be similar to the procedure that was  done to put the port in. Follow these instructions at home:   Flush your port as told by your health care provider.  If you need an infusion over several days, follow instructions from your health care provider about how to take care of your port site. Make sure you: ? Wash your hands with soap and water before you change your dressing. If soap and water are not available, use alcohol-based hand sanitizer. ? Change your dressing as told by your health care provider.  You may remove your dressing tomorrow. ? Place any used dressings or infusion bags into a plastic bag. Throw that bag in the trash. ? Keep the dressing that covers the needle clean and dry. Do not get it  wet. ? Do not use scissors or sharp objects near the tube. ? Keep the tube clamped, unless it is being used.  Check your port site every day for signs of infection. Check for: ? Redness, swelling, or pain. ? Fluid or blood. ? Pus or a bad smell.  Protect the skin around the port site. ? Avoid wearing bra straps that rub or irritate the site. ? Protect the skin around your port from seat belts. Place a soft pad over your chest if needed.  Bathe or shower as told by your health care provider. The site may get wet as long as you are not actively receiving an infusion.  You may shower tomorrow.  Return to your normal activities as told by your health care provider. Ask your health care provider what activities are safe for you.  Carry a medical alert card or wear a medical alert bracelet at all times. This will let health care providers know that you have an implanted port in case of an emergency. Get help right away if:  You have redness, swelling, or pain at the port site.  You have fluid or blood coming from your port site.  You have pus or a bad smell coming from the port site.  You have a fever. Summary  Implanted ports are usually placed in the chest for long-term IV access.  Follow instructions from your health care provider about flushing the port and changing bandages (dressings).  Take care of the area around your port by avoiding clothing that puts pressure on the area, and by watching for signs of infection.  Protect the skin around your port from seat belts. Place a soft pad over your chest if needed.  Get help right away if you have a fever or you have redness, swelling, pain, drainage, or a bad smell at the port site. This information is not intended to replace advice given to you by your health care provider. Make sure you discuss any questions you have with your health care provider. Document Released: 05/10/2005 Document Revised: 06/12/2016 Document Reviewed:  06/12/2016 Elsevier Interactive Patient Education  2019 [place].   Moderate Conscious Sedation, Adult, Care After These instructions provide you with information about caring for yourself after your procedure. Your health care provider may also give you more specific instructions. Your treatment has been planned according to current medical practices, but problems sometimes occur. Call your health care provider if you have any problems or questions after your procedure. What can I expect after the procedure? After your procedure, it is common:  To feel sleepy for several hours.  To feel clumsy and have poor balance for several hours.  To have poor judgment for several hours.  To vomit if you eat  too soon. Follow these instructions at home: For at least 24 hours after the procedure:   Do not: ? Participate in activities where you could fall or become injured. ? Drive. ? Use heavy machinery. ? Drink alcohol. ? Take sleeping pills or medicines that cause drowsiness. ? Make important decisions or sign legal documents. ? Take care of children on your own.  Rest. Eating and drinking  Follow the diet recommended by your health care provider.  If you vomit: ? Drink water, juice, or soup when you can drink without vomiting. ? Make sure you have little or no nausea before eating solid foods. General instructions  Have a responsible adult stay with you until you are awake and alert.  Take over-the-counter and prescription medicines only as told by your health care provider.  If you smoke, do not smoke without supervision.  Keep all follow-up visits as told by your health care provider. This is important. Contact a health care provider if:  You keep feeling nauseous or you keep vomiting.  You feel light-headed.  You develop a rash.  You have a fever. Get help right away if:  You have trouble breathing. This information is not intended to replace advice given to you  by your health care provider. Make sure you discuss any questions you have with your health care provider. Document Released: 02/28/2013 Document Revised: 10/13/2015 Document Reviewed: 08/30/2015 Elsevier Interactive Patient Education  2019 [employer].

## 2018-11-10 NOTE — Progress Notes (Signed)
Adriamycin administered through a free dripping normal saline line. Positive blood return noted before, every 5 mL during and after adriamycin administration. Patient tolerated well.

## 2018-11-10 NOTE — Procedures (Signed)
Interventional Radiology Procedure Note  Procedure: Single Lumen Power Port Placement    Access:  Right IJ vein.  Findings: Catheter tip positioned at SVC/RA junction. Port is ready for immediate use.   Complications: None  EBL: < 10 mL  Recommendations:  - Ok to shower in 24 hours - Do not submerge for 7 days - Routine line care   Evaan Tidwell T. Santana Gosdin, M.D Pager:  [phone]   

## 2018-11-10 NOTE — Patient Instructions (Addendum)
[facility] Discharge Instructions for Patients Receiving Chemotherapy  Today you received the following chemotherapy agents Adriamycin/Vincristine To help prevent nausea and vomiting after your treatment, we encourage you to take your nausea medication as prescribed. If you develop nausea and vomiting that is not controlled by your nausea medication, call the clinic.   DO NOT TAKE ZUPLENZ SAMPLES UNTIL Monday June 22  DO TAKE COMPAZINE (PROCHLORAPERAZINE) PRESCRIPTION IF NEEDED FOR NAUSEA   BELOW ARE SYMPTOMS THAT SHOULD BE REPORTED IMMEDIATELY:  *FEVER GREATER THAN 100.5 F  *CHILLS WITH OR WITHOUT FEVER  NAUSEA AND VOMITING THAT IS NOT CONTROLLED WITH YOUR NAUSEA MEDICATION  *UNUSUAL SHORTNESS OF BREATH  *UNUSUAL BRUISING OR BLEEDING  TENDERNESS IN MOUTH AND THROAT WITH OR WITHOUT PRESENCE OF ULCERS  *URINARY PROBLEMS  *BOWEL PROBLEMS  UNUSUAL RASH Items with * indicate a potential emergency and should be followed up as soon as possible.  Feel free to call the clinic should you have any questions or concerns. The clinic phone number is [phone].  Please show the [facility] at check-in to the Emergency Department and triage nurse.  Doxorubicin injection (Adriamycin) What is this medicine? DOXORUBICIN (dox oh ROO bi sin) is a chemotherapy drug. It is used to treat many kinds of cancer like leukemia, lymphoma, neuroblastoma, sarcoma, and Wilms' tumor. It is also used to treat bladder cancer, breast cancer, lung cancer, ovarian cancer, stomach cancer, and thyroid cancer. This medicine may be used for other purposes; ask your health care provider or pharmacist if you have questions. COMMON BRAND NAME(S): Adriamycin, Adriamycin PFS, Adriamycin RDF, Rubex What should I tell my health care provider before I take this medicine? They need to know if you have any of these conditions: -heart disease -history of low blood counts caused by a medicine -liver  disease -recent or ongoing radiation therapy -an unusual or allergic reaction to doxorubicin, other chemotherapy agents, other medicines, foods, dyes, or preservatives -pregnant or trying to get pregnant -breast-feeding How should I use this medicine? This drug is given as an infusion into a vein. It is administered in a hospital or clinic by a specially trained health care professional. If you have pain, swelling, burning or any unusual feeling around the site of your injection, tell your health care professional right away. Talk to your pediatrician regarding the use of this medicine in children. Special care may be needed. Overdosage: If you think you have taken too much of this medicine contact a poison control center or emergency room at once. NOTE: This medicine is only for you. Do not share this medicine with others. What if I miss a dose? It is important not to miss your dose. Call your doctor or health care professional if you are unable to keep an appointment. What may interact with this medicine? This medicine may interact with the following medications: -6-mercaptopurine -paclitaxel -phenytoin -St. John's Wort -trastuzumab -verapamil This list may not describe all possible interactions. Give your health care provider a list of all the medicines, herbs, non-prescription drugs, or dietary supplements you use. Also tell them if you smoke, drink alcohol, or use illegal drugs. Some items may interact with your medicine. What should I watch for while using this medicine? This drug may make you feel generally unwell. This is not uncommon, as chemotherapy can affect healthy cells as well as cancer cells. Report any side effects. Continue your course of treatment even though you feel ill unless your doctor tells you to stop. There  is a maximum amount of this medicine you should receive throughout your life. The amount depends on the medical condition being treated and your overall health.  Your doctor will watch how much of this medicine you receive in your lifetime. Tell your doctor if you have taken this medicine before. You may need blood work done while you are taking this medicine. Your urine may turn red for a few days after your dose. This is not blood. If your urine is dark or brown, call your doctor. In some cases, you may be given additional medicines to help with side effects. Follow all directions for their use. Call your doctor or health care professional for advice if you get a fever, chills or sore throat, or other symptoms of a cold or flu. Do not treat yourself. This drug decreases your body's ability to fight infections. Try to avoid being around people who are sick. This medicine may increase your risk to bruise or bleed. Call your doctor or health care professional if you notice any unusual bleeding. Talk to your doctor about your risk of cancer. You may be more at risk for certain types of cancers if you take this medicine. Do not become pregnant while taking this medicine or for 6 months after stopping it. Women should inform their doctor if they wish to become pregnant or think they might be pregnant. Men should not father a child while taking this medicine and for 6 months after stopping it. There is a potential for serious side effects to an unborn child. Talk to your health care professional or pharmacist for more information. Do not breast-feed an infant while taking this medicine. This medicine has caused ovarian failure in some women and reduced sperm counts in some men This medicine may interfere with the ability to have a child. Talk with your doctor or health care professional if you are concerned about your fertility. This medicine may cause a decrease in Co-Enzyme Q-10. You should make sure that you get enough Co-Enzyme Q-10 while you are taking this medicine. Discuss the foods you eat and the vitamins you take with your health care professional. What side  effects may I notice from receiving this medicine? Side effects that you should report to your doctor or health care professional as soon as possible: -allergic reactions like skin rash, itching or hives, swelling of the face, lips, or tongue -breathing problems -chest pain -fast or irregular heartbeat -low blood counts - this medicine may decrease the number of white blood cells, red blood cells and platelets. You may be at increased risk for infections and bleeding. -pain, redness, or irritation at site where injected -signs of infection - fever or chills, cough, sore throat, pain or difficulty passing urine -signs of decreased platelets or bleeding - bruising, pinpoint red spots on the skin, black, tarry stools, blood in the urine -swelling of the ankles, feet, hands -tiredness -weakness Side effects that usually do not require medical attention (report to your doctor or health care professional if they continue or are bothersome): -diarrhea -hair loss -mouth sores -nail discoloration or damage -nausea -red colored urine -vomiting This list may not describe all possible side effects. Call your doctor for medical advice about side effects. You may report side effects to FDA at 1-800-FDA-1088. Where should I keep my medicine? This drug is given in a hospital or clinic and will not be stored at home. NOTE: This sheet is a summary. It may not cover all possible information. If  you have questions about this medicine, talk to your doctor, pharmacist, or health care provider.  2019 Elsevier/Gold Standard (2016-12-22 11:01:26)  Vincristine injection What is this medicine? VINCRISTINE (vin KRIS teen) is a chemotherapy drug. It slows the growth of cancer cells. This medicine is used to treat many types of cancer like Hodgkin's disease, leukemia, non-Hodgkin's lymphoma, neuroblastoma (brain cancer), rhabdomyosarcoma, and Wilms' tumor. This medicine may be used for other purposes; ask your  health care provider or pharmacist if you have questions. COMMON BRAND NAME(S): Oncovin, Vincasar PFS What should I tell my health care provider before I take this medicine? They need to know if you have any of these conditions: -blood disorders -gout -infection (especially chickenpox, cold sores, or herpes) -kidney disease -liver disease -lung disease -nervous system disease like Charcot-Marie-Tooth (CMT) -recent or ongoing radiation therapy -an unusual or allergic reaction to vincristine, other chemotherapy agents, other medicines, foods, dyes, or preservatives -pregnant or trying to get pregnant -breast-feeding How should I use this medicine? This drug is given as an infusion into a vein. It is administered in a hospital or clinic by a specially trained health care professional. If you have pain, swelling, burning, or any unusual feeling around the site of your injection, tell your health care professional right away. Talk to your pediatrician regarding the use of this medicine in children. While this drug may be prescribed for selected conditions, precautions do apply. Overdosage: If you think you have taken too much of this medicine contact a poison control center or emergency room at once. NOTE: This medicine is only for you. Do not share this medicine with others. What if I miss a dose? It is important not to miss your dose. Call your doctor or health care professional if you are unable to keep an appointment. What may interact with this medicine? Do not take this medicine with any of the following medications: -itraconazole -mibefradil -voriconazole This medicine may also interact with the following medications: -cyclosporine -erythromycin -fluconazole -ketoconazole -medicines for HIV like delavirdine, efavirenz, nevirapine -medicines for seizures like ethotoin, fosphenotoin, phenytoin -medicines to increase blood counts like filgrastim, pegfilgrastim, sargramostim -other  chemotherapy drugs like cisplatin, L-asparaginase, methotrexate, mitomycin, paclitaxel -pegaspargase -vaccines -zalcitabine, ddC Talk to your doctor or health care professional before taking any of these medicines: -acetaminophen -aspirin -ibuprofen -ketoprofen -naproxen This list may not describe all possible interactions. Give your health care provider a list of all the medicines, herbs, non-prescription drugs, or dietary supplements you use. Also tell them if you smoke, drink alcohol, or use illegal drugs. Some items may interact with your medicine. What should I watch for while using this medicine? Your condition will be monitored carefully while you are receiving this medicine. You will need important blood work done while you are taking this medicine. This drug may make you feel generally unwell. This is not uncommon, as chemotherapy can affect healthy cells as well as cancer cells. Report any side effects. Continue your course of treatment even though you feel ill unless your doctor tells you to stop. In some cases, you may be given additional medicines to help with side effects. Follow all directions for their use. Call your doctor or health care professional for advice if you get a fever, chills or sore throat, or other symptoms of a cold or flu. Do not treat yourself. Avoid taking products that contain aspirin, acetaminophen, ibuprofen, naproxen, or ketoprofen unless instructed by your doctor. These medicines may hide a fever. Do not become pregnant while taking  this medicine. Women should inform their doctor if they wish to become pregnant or think they might be pregnant. There is a potential for serious side effects to an unborn child. Talk to your health care professional or pharmacist for more information. Do not breast-feed an infant while taking this medicine. Men may have a lower sperm count while taking this medicine. Talk to your doctor if you plan to father a child. What side  effects may I notice from receiving this medicine? Side effects that you should report to your doctor or health care professional as soon as possible: -allergic reactions like skin rash, itching or hives, swelling of the face, lips, or tongue -breathing problems -confusion or changes in emotions or moods -constipation -cough -mouth sores -muscle weakness -nausea and vomiting -pain, swelling, redness or irritation at the injection site -pain, tingling, numbness in the hands or feet -problems with balance, talking, walking -seizures -stomach pain -trouble passing urine or change in the amount of urine Side effects that usually do not require medical attention (report to your doctor or health care professional if they continue or are bothersome): -diarrhea -hair loss -jaw pain -loss of appetite This list may not describe all possible side effects. Call your doctor for medical advice about side effects. You may report side effects to FDA at 1-800-FDA-1088. Where should I keep my medicine? This drug is given in a hospital or clinic and will not be stored at home. NOTE: This sheet is a summary. It may not cover all possible information. If you have questions about this medicine, talk to your doctor, pharmacist, or health care provider.  2019 Elsevier/Gold Standard (2008-02-05 17:17:13)

## 2018-11-10 NOTE — H&P (Signed)
Referring Physician(s): Ennever,Peter R  Supervising Physician: Aletta Edouard  Patient Status:  [facility] OP  Chief Complaint: "I'm here for a port a cath"   Subjective: Patient familiar to IR service from left neck/cervical lymph node biopsy on 10/27/2018.  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  He denies fever, headache, chest pain, dyspnea, cough, abdominal pain, back pain, nausea, vomiting or bleeding.  He is anxious. Past Medical History:  Diagnosis Date   Cancer ([place])    Leukemia    CLL (chronic lymphocytic leukemia) ([place])    see records in care everywhere from [facility]   Coronary artery disease    minimal nonobstructive by 10/31/16 cath at [facility] in [place]   Diabetes mellitus without complication ([place])    Diffuse large B-cell lymphoma of lymph nodes of multiple regions ([place]) 10/31/2018   GERD (gastroesophageal reflux disease)    Goals of care, counseling/discussion 10/31/2018   Hypertension    Sleep apnea    does not wear CPAP   Wears dentures    Wears glasses    Past Surgical History:  Procedure Laterality Date   ANTERIOR CERVICAL DECOMP/DISCECTOMY FUSION N/A 04/20/2017   Procedure: ANTERIOR CERVICAL DECOMPRESSION FUSION, CERVICAL 3-4, CERVICAL 4-5 WITH INSTRUMENTATION AND ALLOGRAFT; REQUEST 3.5 HOURS;  Surgeon: Phylliss Bob, MD;  Location: [facility];  Service: Orthopedics;  Laterality: N/A;  ANTERIOR CERVICAL DECOMPRESSION FUSION, CERVICAL 3-4, CERVICAL 4-5 WITH INSTRUMENTATION AND ALLOGRAFT; REQUEST 3.5 HOURS   BACK SURGERY     CARDIAC CATHETERIZATION     in Care Everywhere 11/01/16   COLONOSCOPY     MULTIPLE TOOTH EXTRACTIONS     ORIF ANKLE FRACTURE Left 09/13/2018   Procedure: OPEN REDUCTION INTERNAL FIXATION (ORIF) LATERAL LEFT ANKLE FRACTURE;  Surgeon: Dorna Leitz, MD;  Location: [facility];  Service: Orthopedics;  Laterality: Left;    SYNDESMOSIS REPAIR Left 09/13/2018   Procedure: OPEN REDUCTION INTERNAL FIXATION SYNDESMOSIS REPAIR LEFT ANKLE;  Surgeon: Dorna Leitz, MD;  Location: [facility];  Service: Orthopedics;  Laterality: Left;      Allergies: Patient has no known allergies.  Medications: Prior to Admission medications   Medication Sig Start Date End Date Taking? Authorizing Provider  acetaminophen (TYLENOL) 500 MG tablet Take 500 mg by mouth every 6 (six) hours as needed (pain).    [provider]  allopurinol (ZYLOPRIM) 300 MG tablet Take 300 mg by mouth daily.    [provider]  aspirin EC 325 MG tablet Take 1 tablet (325 mg total) by mouth daily. Take with food. Take x 1 month post op to decrease risk of blood clots. Patient taking differently: Take 81 mg by mouth daily.  09/13/18   Gary Fleet, PA-C  aspirin EC 81 MG tablet Take 81 mg by mouth daily.    [provider]  atenolol (TENORMIN) 25 MG tablet Take 25 mg by mouth daily.     [provider]  atorvastatin (LIPITOR) 40 MG tablet Take 40 mg by mouth daily.    [provider]  CIALIS 20 MG tablet Take 20 mg by mouth as needed for erectile dysfunction. 10/06/17   [provider]  Cinnamon 500 MG TABS Take 500 mg by mouth daily.    [provider]  diclofenac sodium (VOLTAREN) 1 % GEL Apply 1 application topically as needed. 10/10/18   [provider]  famciclovir (FAMVIR) 500 MG tablet Take 1 tablet (500 mg  total) by mouth daily. 11/01/18   Volanda Napoleon, MD  famotidine (PEPCID) 20 MG tablet Take 20 mg by mouth at bedtime as needed for heartburn.     [provider]  HYDROcodone-acetaminophen (NORCO/VICODIN) 5-325 MG tablet Take 1-2 tablets by mouth every 6 (six) hours as needed for moderate pain. 09/13/18   Gary Fleet, PA-C  ibuprofen (ADVIL) 800 MG tablet Take 800 mg by mouth every 8 (eight) hours as needed (pain).    [provider]  ketoconazole (NIZORAL) 2 %  cream Apply 1 application topically as needed for irritation. 11/02/17   [provider]  lidocaine-prilocaine (EMLA) cream Apply to affected area once 11/02/18   Volanda Napoleon, MD  lisinopril (ZESTRIL) 40 MG tablet Take 1 tablet (40 mg total) by mouth daily for 30 days. 10/29/18 11/28/18  Donne Hazel, MD  LORazepam (ATIVAN) 0.5 MG tablet Take 1 tablet (0.5 mg total) by mouth every 6 (six) hours as needed (Nausea or vomiting). 11/02/18   Volanda Napoleon, MD  NON FORMULARY Take 1 tablet by mouth daily. Blood Sugar Defense     [provider]  ondansetron (ZOFRAN) 8 MG tablet Take 1 tablet (8 mg total) by mouth 2 (two) times daily as needed for refractory nausea / vomiting. Start on day 3 after cyclophosphamide chemotherapy. 11/02/18   Volanda Napoleon, MD  potassium chloride (K-DUR) 10 MEQ tablet Take 10 mEq by mouth daily.     [provider]  predniSONE (DELTASONE) 20 MG tablet Take 3 tablets (60 mg total) by mouth daily. Take on days 1-5 of chemotherapy. 11/02/18   Volanda Napoleon, MD  prochlorperazine (COMPAZINE) 10 MG tablet Take 1 tablet (10 mg total) by mouth every 6 (six) hours as needed (Nausea or vomiting). 11/02/18   Volanda Napoleon, MD  tamsulosin (FLOMAX) 0.4 MG CAPS capsule Take 0.4 mg by mouth daily. 09/12/18   [provider]  tiZANidine (ZANAFLEX) 4 MG tablet Take 1 tablet (4 mg total) by mouth every 12 (twelve) hours as needed for muscle spasms. 10/28/18   Donne Hazel, MD  traMADol (ULTRAM) 50 MG tablet Take 1 tablet (50 mg total) by mouth every 6 (six) hours as needed. 10/30/18   Volanda Napoleon, MD     Vital Signs: Blood pressure 161/102, temp 98.3, heart rate 89, respirations 18, O2 sat 98% room air   Physical Exam awake, alert.  Chest clear to auscultation bilaterally.  Heart with regular rate and rhythm.  Abdomen obese, soft, positive bowel sounds, nontender.  Trace to 1+ pretibial edema bilaterally.  Imaging: Nm Pet Image Initial (pi)  Skull Base To Thigh  Result Date: 11/07/2018 CLINICAL DATA:  Initial treatment strategy for diffuse large B-cell lymphoma. EXAM: NUCLEAR MEDICINE PET SKULL BASE TO THIGH TECHNIQUE: 14.3 mCi F-18 FDG was injected intravenously. Full-ring PET imaging was performed from the skull base to thigh after the radiotracer. CT data was obtained and used for attenuation correction and anatomic localization. Fasting blood glucose: 175 mg/dl COMPARISON:  CT scan 10/27/2018 FINDINGS: Mediastinal blood pool activity: SUV max 2.7 Liver activity: SUV max 4.5 NECK: The bulky left lower cervical lymphadenopathy is is markedly hypermetabolic with SUV max = 50.0. Large necrotic node/nodal conglomeration measures 5.7 x 4.9 cm on 34/4. Incidental CT findings: Left cervical lymphadenopathy generates mass-effect on the hypopharynx. CHEST: Bulky left supraclavicular lymphadenopathy is hypermetabolic. 3 cm index node (44/4) demonstrates SUV max = 14.6. A discrete 2.3 cm lymph node in the left  thoracic inlet (58/4) has SUV max = 15.8. Contralateral right supraclavicular node measuring 8 mm short axis demonstrates SUV max = 3.3. 15 mm short axis left subpectoral node (65/4) demonstrates SUV max = 3.1. 1.4 cm right subpectoral index node (63/4) demonstrates SUV max = 2.9. Smaller lymph nodes in the axillary regions are also mildly hypermetabolic. Increased number of small lymph nodes are seen in the mediastinum. 17 mm short axis precarinal lymph node has SUV max = 2.7. Incidental CT findings: No pericardial effusion. No thoracic cord again aneurysm. 6 mm left lower lobe pulmonary nodule (54/8) is stable since prior in too small to characterize by PET imaging. ABDOMEN/PELVIS: Multiple low-density non hypermetabolic lesions are identified in the liver. 14 mm left adrenal nodule shows mild hypermetabolism with SUV max = 4.5. This is stable since CT from 04/18/2018. Upper normal to borderline enlarged lymph nodes are identified in the  retroperitoneal space. 1.6 cm short axis node on 150/4 has SUV max = 2.7. 2.0 cm short axis left pelvic sidewall node (190/4) demonstrates SUV max = 3.3. Right pelvic sidewall node measuring 1.7 cm short axis (193/4) demonstrates SUV max = 3.1. Small groin lymph nodes show low level hypermetabolism. No splenic hypermetabolism. Incidental CT findings: Multiple very large cysts are identified in the left kidney, distorting left renal anatomy in generating mass-effect in the left abdomen. This was better evaluated on the recent diagnostic CT scan. Umbilical hernia contains a short segment of small bowel without complicating features. SKELETON: No focal hypermetabolic activity to suggest skeletal metastasis. Incidental CT findings: No worrisome lytic or sclerotic osseous abnormality. Status post lumbosacral fusion. IMPRESSION: 1. Bulky left cervical, left supraclavicular, and left thoracic inlet lymphadenopathy is markedly hypermetabolic consistent with Deauville 4-5 activity. 2. Increased number of small lymph nodes in the right supraclavicular region, bilateral axillary regions, subpectoral regions, and mediastinum with Deauville 3 activity. 3. Mildly enlarged lymph nodes in the retroperitoneal space along both pelvic sidewalls show FDG accumulation compatible with Deauville 3 activity. 4. No splenic hypermetabolism. 5. Multiple large cystic lesions in the left kidney, distorting renal anatomy. Electronically Signed   By: Misty Stanley M.D.   On: 11/07/2018 13:14    Labs:  CBC: Recent Labs    09/13/18 0614 10/26/18 2345 11/01/18 1044 11/08/18 0802  WBC 8.5 11.5* 9.6 8.3  HGB 14.8 14.7 14.2 13.3  HCT 40.5 40.5 39.9 38.1*  PLT 143* 168 178 178    COAGS: Recent Labs    11/22/17 1844 09/13/18 0614 10/27/18 1452  INR 1.04 1.0 1.1  APTT 25 26  --     BMP: Recent Labs    09/13/18 0614 10/26/18 2345 11/01/18 1044 11/08/18 0802  NA 140 136 140 141  K 3.3* 3.7 3.9 4.2  CL 101 98 101 105    CO2 29 27 31 27   GLUCOSE 160* 177* 266* 243*  BUN 12 8 12 11   CALCIUM 9.1 9.5 9.5 8.9  CREATININE 1.28* 1.18 1.16 1.12  GFRNONAA 56* >60 >60 >60  GFRAA >60 >60 >60 >60    LIVER FUNCTION TESTS: Recent Labs    11/24/17 1130 10/26/18 2345 11/01/18 1044 11/08/18 0802  BILITOT 1.7* 1.3* 0.5 0.6  AST 23 21 20 19   ALT 18 23 24 20   ALKPHOS 60 71 68 75  PROT 6.2* 7.1 6.9 6.1*  ALBUMIN 3.3* 4.4 4.4 4.1    Assessment and Plan: Pt with history of 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  All of the patient's questions were answered, patient is agreeable to proceed. Consent signed and in chart.  LABS PENDING   Electronically Signed: D. Rowe Robert, PA-C 11/10/2018, 8:46 AM   I spent a total of 25 minutes at the the patient's bedside AND on the patient's hospital floor or unit, greater than 50% of which was counseling/coordinating care for Port-A-Cath placement

## 2018-11-10 NOTE — Sedation Documentation (Signed)
Patient is resting comfortably with eyes closed, in NAD. 

## 2018-11-13 ENCOUNTER — Inpatient Hospital Stay: Payer: Medicare HMO

## 2018-11-13 ENCOUNTER — Encounter: Payer: Self-pay | Admitting: *Deleted

## 2018-11-13 ENCOUNTER — Other Ambulatory Visit: Payer: Self-pay

## 2018-11-13 VITALS — BP 144/77 | HR 96 | Temp 98.0°F | Resp 18

## 2018-11-13 DIAGNOSIS — C8338 Diffuse large B-cell lymphoma, lymph nodes of multiple sites: Secondary | ICD-10-CM

## 2018-11-13 DIAGNOSIS — Z5112 Encounter for antineoplastic immunotherapy: Secondary | ICD-10-CM | POA: Diagnosis not present

## 2018-11-13 DIAGNOSIS — Z7982 Long term (current) use of aspirin: Secondary | ICD-10-CM | POA: Diagnosis not present

## 2018-11-13 DIAGNOSIS — Z79899 Other long term (current) drug therapy: Secondary | ICD-10-CM | POA: Diagnosis not present

## 2018-11-13 DIAGNOSIS — Z791 Long term (current) use of non-steroidal anti-inflammatories (NSAID): Secondary | ICD-10-CM | POA: Diagnosis not present

## 2018-11-13 DIAGNOSIS — Z5189 Encounter for other specified aftercare: Secondary | ICD-10-CM | POA: Diagnosis not present

## 2018-11-13 DIAGNOSIS — Z5111 Encounter for antineoplastic chemotherapy: Secondary | ICD-10-CM | POA: Diagnosis not present

## 2018-11-13 DIAGNOSIS — E119 Type 2 diabetes mellitus without complications: Secondary | ICD-10-CM | POA: Diagnosis not present

## 2018-11-13 MED ORDER — PEGFILGRASTIM-CBQV 6 MG/0.6ML ~~LOC~~ SOSY
6.0000 mg | PREFILLED_SYRINGE | Freq: Once | SUBCUTANEOUS | Status: AC
  Administered 2018-11-13: 6 mg via SUBCUTANEOUS

## 2018-11-13 MED ORDER — PEGFILGRASTIM-CBQV 6 MG/0.6ML ~~LOC~~ SOSY
PREFILLED_SYRINGE | SUBCUTANEOUS | Status: AC
  Filled 2018-11-13: qty 0.6

## 2018-11-13 NOTE — Progress Notes (Unsigned)
Patient here for injection.  Chemo follow up done.  Feeling well and had virtually no nausea or vomiting after treatment on Friday.  Reviewed meds and [place] done.

## 2018-11-13 NOTE — Patient Instructions (Signed)
Pegfilgrastim injection  What is this medicine?  PEGFILGRASTIM (PEG fil gra stim) is a long-acting granulocyte colony-stimulating factor that stimulates the growth of neutrophils, a type of white blood cell important in the body's fight against infection. It is used to reduce the incidence of fever and infection in patients with certain types of cancer who are receiving chemotherapy that affects the bone marrow, and to increase survival after being exposed to high doses of radiation.  This medicine may be used for other purposes; ask your health care provider or pharmacist if you have questions.  COMMON BRAND NAME(S): Fulphila, Neulasta, UDENYCA  What should I tell my health care provider before I take this medicine?  They need to know if you have any of these conditions:  -kidney disease  -latex allergy  -ongoing radiation therapy  -sickle cell disease  -skin reactions to acrylic adhesives (On-Body Injector only)  -an unusual or allergic reaction to pegfilgrastim, filgrastim, other medicines, foods, dyes, or preservatives  -pregnant or trying to get pregnant  -breast-feeding  How should I use this medicine?  This medicine is for injection under the skin. If you get this medicine at home, you will be taught how to prepare and give the pre-filled syringe or how to use the On-body Injector. Refer to the patient Instructions for Use for detailed instructions. Use exactly as directed. Tell your healthcare provider immediately if you suspect that the On-body Injector may not have performed as intended or if you suspect the use of the On-body Injector resulted in a missed or partial dose.  It is important that you put your used needles and syringes in a special sharps container. Do not put them in a trash can. If you do not have a sharps container, call your pharmacist or healthcare provider to get one.  Talk to your pediatrician regarding the use of this medicine in children. While this drug may be prescribed for  selected conditions, precautions do apply.  Overdosage: If you think you have taken too much of this medicine contact a poison control center or emergency room at once.  NOTE: This medicine is only for you. Do not share this medicine with others.  What if I miss a dose?  It is important not to miss your dose. Call your doctor or health care professional if you miss your dose. If you miss a dose due to an On-body Injector failure or leakage, a new dose should be administered as soon as possible using a single prefilled syringe for manual use.  What may interact with this medicine?  Interactions have not been studied.  Give your health care provider a list of all the medicines, herbs, non-prescription drugs, or dietary supplements you use. Also tell them if you smoke, drink alcohol, or use illegal drugs. Some items may interact with your medicine.  This list may not describe all possible interactions. Give your health care provider a list of all the medicines, herbs, non-prescription drugs, or dietary supplements you use. Also tell them if you smoke, drink alcohol, or use illegal drugs. Some items may interact with your medicine.  What should I watch for while using this medicine?  You may need blood work done while you are taking this medicine.  If you are going to need a MRI, CT scan, or other procedure, tell your doctor that you are using this medicine (On-Body Injector only).  What side effects may I notice from receiving this medicine?  Side effects that you should report to   your doctor or health care professional as soon as possible:  -allergic reactions like skin rash, itching or hives, swelling of the face, lips, or tongue  -back pain  -dizziness  -fever  -pain, redness, or irritation at site where injected  -pinpoint red spots on the skin  -red or dark-brown urine  -shortness of breath or breathing problems  -stomach or side pain, or pain at the shoulder  -swelling  -tiredness  -trouble passing urine or  change in the amount of urine  Side effects that usually do not require medical attention (report to your doctor or health care professional if they continue or are bothersome):  -bone pain  -muscle pain  This list may not describe all possible side effects. Call your doctor for medical advice about side effects. You may report side effects to FDA at 1-800-FDA-1088.  Where should I keep my medicine?  Keep out of the reach of children.  If you are using this medicine at home, you will be instructed on how to store it. Throw away any unused medicine after the expiration date on the label.  NOTE: This sheet is a summary. It may not cover all possible information. If you have questions about this medicine, talk to your doctor, pharmacist, or health care provider.   2019 [employer] Standard (2017-08-15 16:57:08)

## 2018-11-15 ENCOUNTER — Other Ambulatory Visit: Payer: Self-pay | Admitting: Hematology & Oncology

## 2018-11-16 ENCOUNTER — Telehealth: Payer: Self-pay | Admitting: *Deleted

## 2018-11-16 NOTE — Telephone Encounter (Signed)
Call received from patient's wife requesting the date of patient's diagnosis.  Pt.'s wife instructed that pt.'s biopsy was done on 10/27/18 and that his initial visit with Dr. Marin Olp was on 11/01/18.  Pt.'s wife appreciative of assistance and has no further questions at this time.

## 2018-11-17 ENCOUNTER — Other Ambulatory Visit (HOSPITAL_COMMUNITY): Payer: Medicare HMO

## 2018-11-17 ENCOUNTER — Ambulatory Visit (HOSPITAL_COMMUNITY): Payer: Medicare HMO

## 2018-11-21 DIAGNOSIS — S8262XD Displaced fracture of lateral malleolus of left fibula, subsequent encounter for closed fracture with routine healing: Secondary | ICD-10-CM | POA: Diagnosis not present

## 2018-11-23 DIAGNOSIS — N390 Urinary tract infection, site not specified: Secondary | ICD-10-CM | POA: Diagnosis not present

## 2018-11-23 DIAGNOSIS — N281 Cyst of kidney, acquired: Secondary | ICD-10-CM | POA: Diagnosis not present

## 2018-11-28 ENCOUNTER — Other Ambulatory Visit: Payer: Self-pay | Admitting: *Deleted

## 2018-11-28 DIAGNOSIS — C8338 Diffuse large B-cell lymphoma, lymph nodes of multiple sites: Secondary | ICD-10-CM

## 2018-11-28 DIAGNOSIS — E119 Type 2 diabetes mellitus without complications: Secondary | ICD-10-CM | POA: Diagnosis not present

## 2018-11-28 DIAGNOSIS — C9110 Chronic lymphocytic leukemia of B-cell type not having achieved remission: Secondary | ICD-10-CM

## 2018-11-28 DIAGNOSIS — I10 Essential (primary) hypertension: Secondary | ICD-10-CM | POA: Diagnosis not present

## 2018-11-28 DIAGNOSIS — K219 Gastro-esophageal reflux disease without esophagitis: Secondary | ICD-10-CM | POA: Diagnosis not present

## 2018-11-28 DIAGNOSIS — C8190 Hodgkin lymphoma, unspecified, unspecified site: Secondary | ICD-10-CM | POA: Diagnosis not present

## 2018-11-28 DIAGNOSIS — C9190 Lymphoid leukemia, unspecified not having achieved remission: Secondary | ICD-10-CM | POA: Diagnosis not present

## 2018-11-28 DIAGNOSIS — R413 Other amnesia: Secondary | ICD-10-CM | POA: Diagnosis not present

## 2018-11-29 ENCOUNTER — Inpatient Hospital Stay: Payer: Medicare HMO | Attending: Hematology & Oncology | Admitting: Hematology & Oncology

## 2018-11-29 ENCOUNTER — Inpatient Hospital Stay: Payer: Medicare HMO

## 2018-11-29 ENCOUNTER — Encounter: Payer: Self-pay | Admitting: Hematology & Oncology

## 2018-11-29 ENCOUNTER — Other Ambulatory Visit: Payer: Self-pay

## 2018-11-29 VITALS — BP 138/76 | HR 92 | Resp 18

## 2018-11-29 VITALS — BP 137/80 | HR 111 | Temp 98.0°F | Resp 20 | Wt 294.4 lb

## 2018-11-29 DIAGNOSIS — C8338 Diffuse large B-cell lymphoma, lymph nodes of multiple sites: Secondary | ICD-10-CM

## 2018-11-29 DIAGNOSIS — E1140 Type 2 diabetes mellitus with diabetic neuropathy, unspecified: Secondary | ICD-10-CM

## 2018-11-29 DIAGNOSIS — Z5111 Encounter for antineoplastic chemotherapy: Secondary | ICD-10-CM | POA: Insufficient documentation

## 2018-11-29 DIAGNOSIS — R590 Localized enlarged lymph nodes: Secondary | ICD-10-CM | POA: Diagnosis not present

## 2018-11-29 DIAGNOSIS — C9110 Chronic lymphocytic leukemia of B-cell type not having achieved remission: Secondary | ICD-10-CM

## 2018-11-29 DIAGNOSIS — Z79899 Other long term (current) drug therapy: Secondary | ICD-10-CM

## 2018-11-29 LAB — CBC WITH DIFFERENTIAL (CANCER CENTER ONLY)
Abs Immature Granulocytes: 0.07 10*3/uL (ref 0.00–0.07)
Basophils Absolute: 0.1 10*3/uL (ref 0.0–0.1)
Basophils Relative: 1 %
Eosinophils Absolute: 0 10*3/uL (ref 0.0–0.5)
Eosinophils Relative: 1 %
HCT: 35.4 % — ABNORMAL LOW (ref 39.0–52.0)
Hemoglobin: 12.5 g/dL — ABNORMAL LOW (ref 13.0–17.0)
Immature Granulocytes: 1 %
Lymphocytes Relative: 20 %
Lymphs Abs: 1.1 10*3/uL (ref 0.7–4.0)
MCH: 30.9 pg (ref 26.0–34.0)
MCHC: 35.3 g/dL (ref 30.0–36.0)
MCV: 87.4 fL (ref 80.0–100.0)
Monocytes Absolute: 0.6 10*3/uL (ref 0.1–1.0)
Monocytes Relative: 11 %
Neutro Abs: 3.7 10*3/uL (ref 1.7–7.7)
Neutrophils Relative %: 66 %
Platelet Count: 178 10*3/uL (ref 150–400)
RBC: 4.05 MIL/uL — ABNORMAL LOW (ref 4.22–5.81)
RDW: 13.1 % (ref 11.5–15.5)
WBC Count: 5.6 10*3/uL (ref 4.0–10.5)
nRBC: 0 % (ref 0.0–0.2)

## 2018-11-29 LAB — CMP (CANCER CENTER ONLY)
ALT: 25 U/L (ref 0–44)
AST: 22 U/L (ref 15–41)
Albumin: 3.8 g/dL (ref 3.5–5.0)
Alkaline Phosphatase: 53 U/L (ref 38–126)
Anion gap: 8 (ref 5–15)
BUN: 11 mg/dL (ref 8–23)
CO2: 26 mmol/L (ref 22–32)
Calcium: 8 mg/dL — ABNORMAL LOW (ref 8.9–10.3)
Chloride: 105 mmol/L (ref 98–111)
Creatinine: 1.04 mg/dL (ref 0.61–1.24)
GFR, Est AFR Am: >60 mL/min (ref >60)
GFR, Estimated: >60 mL/min (ref >60)
Glucose, Bld: 260 mg/dL — ABNORMAL HIGH (ref 70–99)
Potassium: 3.8 mmol/L (ref 3.5–5.1)
Sodium: 139 mmol/L (ref 135–145)
Total Bilirubin: 0.5 mg/dL (ref 0.3–1.2)
Total Protein: 5.8 g/dL — ABNORMAL LOW (ref 6.5–8.1)

## 2018-11-29 LAB — URIC ACID: Uric Acid, Serum: 5 mg/dL (ref 3.7–8.6)

## 2018-11-29 LAB — LACTATE DEHYDROGENASE: LDH: 217 U/L — ABNORMAL HIGH (ref 98–192)

## 2018-11-29 MED ORDER — SODIUM CHLORIDE 0.9 % IV SOLN
375.0000 mg/m2 | Freq: Once | INTRAVENOUS | Status: AC
  Administered 2018-11-29: 1000 mg via INTRAVENOUS
  Filled 2018-11-29: qty 100

## 2018-11-29 MED ORDER — SODIUM CHLORIDE 0.9 % IV SOLN
750.0000 mg/m2 | Freq: Once | INTRAVENOUS | Status: AC
  Administered 2018-11-29: 1960 mg via INTRAVENOUS
  Filled 2018-11-29: qty 98

## 2018-11-29 MED ORDER — SODIUM CHLORIDE 0.9% FLUSH
10.0000 mL | INTRAVENOUS | Status: DC | PRN
  Administered 2018-11-29: 10 mL
  Filled 2018-11-29: qty 10

## 2018-11-29 MED ORDER — ACETAMINOPHEN 325 MG PO TABS
650.0000 mg | ORAL_TABLET | Freq: Once | ORAL | Status: AC
  Administered 2018-11-29: 650 mg via ORAL

## 2018-11-29 MED ORDER — PALONOSETRON HCL INJECTION 0.25 MG/5ML
0.2500 mg | Freq: Once | INTRAVENOUS | Status: AC
  Administered 2018-11-29: 0.25 mg via INTRAVENOUS

## 2018-11-29 MED ORDER — VINCRISTINE SULFATE CHEMO INJECTION 1 MG/ML
1.5000 mg | Freq: Once | INTRAVENOUS | Status: AC
  Administered 2018-11-29: 1.5 mg via INTRAVENOUS
  Filled 2018-11-29: qty 1.5

## 2018-11-29 MED ORDER — PALONOSETRON HCL INJECTION 0.25 MG/5ML
INTRAVENOUS | Status: AC
  Filled 2018-11-29: qty 5

## 2018-11-29 MED ORDER — SODIUM CHLORIDE 0.9 % IV SOLN
Freq: Once | INTRAVENOUS | Status: AC
  Administered 2018-11-29: 10:00:00 via INTRAVENOUS
  Filled 2018-11-29: qty 250

## 2018-11-29 MED ORDER — DIPHENHYDRAMINE HCL 25 MG PO CAPS
50.0000 mg | ORAL_CAPSULE | Freq: Once | ORAL | Status: AC
  Administered 2018-11-29: 50 mg via ORAL

## 2018-11-29 MED ORDER — DIPHENHYDRAMINE HCL 25 MG PO CAPS
ORAL_CAPSULE | ORAL | Status: AC
  Filled 2018-11-29: qty 2

## 2018-11-29 MED ORDER — ACETAMINOPHEN 325 MG PO TABS
ORAL_TABLET | ORAL | Status: AC
  Filled 2018-11-29: qty 2

## 2018-11-29 MED ORDER — DOXORUBICIN HCL CHEMO IV INJECTION 2 MG/ML
50.0000 mg/m2 | Freq: Once | INTRAVENOUS | Status: AC
  Administered 2018-11-29: 130 mg via INTRAVENOUS
  Filled 2018-11-29: qty 65

## 2018-11-29 MED ORDER — SODIUM CHLORIDE 0.9 % IV SOLN
Freq: Once | INTRAVENOUS | Status: AC
  Administered 2018-11-29: 10:00:00 via INTRAVENOUS
  Filled 2018-11-29: qty 5

## 2018-11-29 MED ORDER — HEPARIN SOD (PORK) LOCK FLUSH 100 UNIT/ML IV SOLN
500.0000 [IU] | Freq: Once | INTRAVENOUS | Status: AC | PRN
  Administered 2018-11-29: 500 [IU]
  Filled 2018-11-29: qty 5

## 2018-11-29 NOTE — Progress Notes (Signed)
Hematology and Oncology Follow Up Visit  Ronald Rice [id number] [birth year] 73 y.o. 11/29/2018   Principle Diagnosis:   Diffuse large cell non-Hodgkin's lymphoma-Richter's transformation from CLL  Current Therapy:    R-CHOP-s/p cycle 1 on 11/08/2018     Interim History:  Ronald Rice is back for for follow-up.   The chemotherapy is working very nicely.  The massive adenopathy on the left neck is now resolving nicely.  He can still feel some adenopathy but this clearly has improved.  I would have to say it is probably down by about 80%.  He feels okay.  I am going to have to decrease the dose of vincristine a little bit just because of some neuropathy issues.  He does have diabetes which I think is making the neuropathy more pronounced.  He has had no problems with cough.  There is no diarrhea.  He has had no mouth sores.  He has had no fever.  There is no bleeding.  His appetite is doing quite well.  He did have a nice July 4 holiday.  Overall, I would say his performance status is ECOG 1.  Medications:  Current Outpatient Medications:    acetaminophen (TYLENOL) 500 MG tablet, Take 500 mg by mouth every 6 (six) hours as needed (pain)., Disp: , Rfl:    allopurinol (ZYLOPRIM) 300 MG tablet, Take 300 mg by mouth daily., Disp: , Rfl:    atenolol (TENORMIN) 25 MG tablet, Take 25 mg by mouth daily. , Disp: , Rfl:    atorvastatin (LIPITOR) 40 MG tablet, Take 40 mg by mouth daily., Disp: , Rfl:    CIALIS 20 MG tablet, Take 20 mg by mouth as needed for erectile dysfunction., Disp: , Rfl: 6   Cinnamon 500 MG TABS, Take 500 mg by mouth daily., Disp: , Rfl:    diclofenac sodium (VOLTAREN) 1 % GEL, Apply 1 application topically as needed., Disp: , Rfl:    famciclovir (FAMVIR) 500 MG tablet, Take 1 tablet (500 mg total) by mouth daily., Disp: 30 tablet, Rfl: 8   famotidine (PEPCID) 20 MG tablet, Take 20 mg by mouth at bedtime as needed for heartburn. , Disp: , Rfl:     HYDROcodone-acetaminophen (NORCO/VICODIN) 5-325 MG tablet, Take 1-2 tablets by mouth every 6 (six) hours as needed for moderate pain., Disp: 30 tablet, Rfl: 0   ketoconazole (NIZORAL) 2 % cream, Apply 1 application topically as needed for irritation., Disp: , Rfl: 0   LORazepam (ATIVAN) 0.5 MG tablet, Take 1 tablet (0.5 mg total) by mouth every 6 (six) hours as needed (Nausea or vomiting)., Disp: 30 tablet, Rfl: 0   ondansetron (ZOFRAN) 8 MG tablet, Take 1 tablet (8 mg total) by mouth 2 (two) times daily as needed for refractory nausea / vomiting. Start on day 3 after cyclophosphamide chemotherapy., Disp: 30 tablet, Rfl: 1   potassium chloride (K-DUR) 10 MEQ tablet, Take 10 mEq by mouth daily. , Disp: , Rfl:    prochlorperazine (COMPAZINE) 10 MG tablet, Take 1 tablet (10 mg total) by mouth every 6 (six) hours as needed (Nausea or vomiting)., Disp: 30 tablet, Rfl: 6   tamsulosin (FLOMAX) 0.4 MG CAPS capsule, Take 0.4 mg by mouth daily., Disp: , Rfl:    tiZANidine (ZANAFLEX) 4 MG tablet, Take 1 tablet (4 mg total) by mouth every 12 (twelve) hours as needed for muscle spasms., Disp: 30 tablet, Rfl: 0   traMADol (ULTRAM) 50 MG tablet, Take 1 tablet (50 mg total) by mouth every 6 (  six) hours as needed., Disp: 60 tablet, Rfl: 0   aspirin EC 325 MG tablet, Take 1 tablet (325 mg total) by mouth daily. Take with food. Take x 1 month post op to decrease risk of blood clots. (Patient taking differently: Take 81 mg by mouth daily. ), Disp: 30 tablet, Rfl: 0   aspirin EC 81 MG tablet, Take 81 mg by mouth daily., Disp: , Rfl:    ibuprofen (ADVIL) 800 MG tablet, Take 800 mg by mouth every 8 (eight) hours as needed (pain)., Disp: , Rfl:    lidocaine-prilocaine (EMLA) cream, Apply to affected area once (Patient not taking: Reported on 11/29/2018), Disp: 30 g, Rfl: 3   lisinopril (ZESTRIL) 40 MG tablet, Take 1 tablet (40 mg total) by mouth daily for 30 days., Disp: 30 tablet, Rfl: 0   NON FORMULARY, Take 1  tablet by mouth daily. Blood Sugar Defense , Disp: , Rfl:    predniSONE (DELTASONE) 20 MG tablet, Take 3 tablets (60 mg total) by mouth daily. Take on days 1-5 of chemotherapy., Disp: 15 tablet, Rfl: 6  Allergies:  No Known Allergies  Past Medical History, Surgical history, Social history, and Family History were reviewed and updated.  Review of Systems: Review of Systems  Constitutional: Negative.   HENT:  Negative.   Eyes: Negative.   Respiratory: Negative.   Cardiovascular: Negative.   Gastrointestinal: Negative.   Endocrine: Negative.   Genitourinary: Positive for nocturia.   Musculoskeletal: Positive for arthralgias, gait problem and myalgias.  Neurological: Positive for gait problem.  Hematological: Negative.   Psychiatric/Behavioral: Negative.     Physical Exam:  weight is 294 lb 6.4 oz (133.5 kg). His oral temperature is 98 F (36.7 C). His blood pressure is 137/80 and his pulse is 111 (abnormal). His respiration is 20 and oxygen saturation is 98%.   Wt Readings from Last 3 Encounters:  11/29/18 294 lb 6.4 oz (133.5 kg)  11/01/18 286 lb 1.9 oz (129.8 kg)  10/28/18 295 lb 6.7 oz (134 kg)    Physical Exam Vitals signs reviewed.  HENT:     Head: Normocephalic and atraumatic.  Eyes:     Pupils: Pupils are equal, round, and reactive to light.  Neck:     Comments: Examination of his neck shows marked improvement in the adenopathy in the left neck.  He still has some palpable posterior cervical lymphadenopathy.  It probably measures about 3 x 3 cm.  It is not as firm.  It is somewhat mobile.. Cardiovascular:     Rate and Rhythm: Normal rate and regular rhythm.     Heart sounds: Normal heart sounds.  Pulmonary:     Effort: Pulmonary effort is normal.     Breath sounds: Normal breath sounds.  Abdominal:     General: Bowel sounds are normal.     Palpations: Abdomen is soft.  Musculoskeletal: Normal range of motion.        General: No tenderness or deformity.    Lymphadenopathy:     Cervical: No cervical adenopathy.  Skin:    General: Skin is warm and dry.     Findings: No erythema or rash.  Neurological:     Mental Status: He is alert and oriented to person, place, and time.  Psychiatric:        Behavior: Behavior normal.        Thought Content: Thought content normal.        Judgment: Judgment normal.      Lab Results  Component Value Date   WBC 7.6 11/10/2018   HGB 13.5 11/10/2018   HCT 38.2 (L) 11/10/2018   MCV 88.2 11/10/2018   PLT 199 11/10/2018     Chemistry      Component Value Date/Time   NA 141 11/08/2018 0802   NA 142 01/25/2017 1217   K 4.2 11/08/2018 0802   K 4.1 01/25/2017 1217   CL 105 11/08/2018 0802   CL 102 01/25/2017 1217   CO2 27 11/08/2018 0802   CO2 33 01/25/2017 1217   BUN 11 11/08/2018 0802   BUN 12 01/25/2017 1217   CREATININE 1.12 11/08/2018 0802   CREATININE 1.4 (H) 01/25/2017 1217      Component Value Date/Time   CALCIUM 8.9 11/08/2018 0802   CALCIUM 9.7 01/25/2017 1217   ALKPHOS 75 11/08/2018 0802   ALKPHOS 59 01/25/2017 1217   AST 19 11/08/2018 0802   ALT 20 11/08/2018 0802   ALT 32 01/25/2017 1217   BILITOT 0.6 11/08/2018 0802         Impression and Plan: Ronald Rice is a 73 year old African-American male.  He was diagnosed with CLL back in [place].  We will go ahead with his second cycle of chemotherapy today.  After this, I will then do a PET scan so we can see how he has responded.  I would like to believe that the response will be significant with over 90% of the adenopathy gone.  Because of the bulky lymphadenopathy in the left neck.  We may have to think about radiation therapy to this area once we get done with treatment.  We will plan to get him back in 3 weeks for his third cycle of treatment.  He will need Neulasta.  I probably will also need to get him on some oral antibiotics to help with neutropenia.   Volanda Napoleon, MD 7/8/20208:34 AM

## 2018-11-29 NOTE — Progress Notes (Signed)
0830 Patient is poor historian regarding medications, states wife gives his meds. Encouraged to bring medication list at next visit. Verbalized understanding.

## 2018-11-29 NOTE — Patient Instructions (Signed)
Rituximab injection What is this medicine? RITUXIMAB (ri TUX i mab) is a monoclonal antibody. It is used to treat certain types of cancer like non-Hodgkin lymphoma and chronic lymphocytic leukemia. It is also used to treat rheumatoid arthritis, granulomatosis with polyangiitis (or Wegener's granulomatosis), microscopic polyangiitis, and pemphigus vulgaris. This medicine may be used for other purposes; ask your health care provider or pharmacist if you have questions. COMMON BRAND NAME(S): Rituxan, RUXIENCE What should I tell my health care provider before I take this medicine? They need to know if you have any of these conditions:  heart disease  infection (especially a virus infection such as hepatitis B, chickenpox, cold sores, or herpes)  immune system problems  irregular heartbeat  kidney disease  low blood counts, like low white cell, platelet, or red cell counts  lung or breathing disease, like asthma  recently received or scheduled to receive a vaccine  an unusual or allergic reaction to rituximab, other medicines, foods, dyes, or preservatives  pregnant or trying to get pregnant  breast-feeding How should I use this medicine? This medicine is for infusion into a vein. It is administered in a hospital or clinic by a specially trained health care professional. A special MedGuide will be given to you by the pharmacist with each prescription and refill. Be sure to read this information carefully each time. Talk to your pediatrician regarding the use of this medicine in children. This medicine is not approved for use in children. Overdosage: If you think you have taken too much of this medicine contact a poison control center or emergency room at once. NOTE: This medicine is only for you. Do not share this medicine with others. What if I miss a dose? It is important not to miss a dose. Call your doctor or health care professional if you are unable to keep an appointment. What  may interact with this medicine?  cisplatin  live virus vaccines This list may not describe all possible interactions. Give your health care provider a list of all the medicines, herbs, non-prescription drugs, or dietary supplements you use. Also tell them if you smoke, drink alcohol, or use illegal drugs. Some items may interact with your medicine. What should I watch for while using this medicine? Your condition will be monitored carefully while you are receiving this medicine. You may need blood work done while you are taking this medicine. This medicine can cause serious allergic reactions. To reduce your risk you may need to take medicine before treatment with this medicine. Take your medicine as directed. In some patients, this medicine may cause a serious brain infection that may cause death. If you have any problems seeing, thinking, speaking, walking, or standing, tell your healthcare professional right away. If you cannot reach your healthcare professional, urgently seek other source of medical care. Call your doctor or health care professional for advice if you get a fever, chills or sore throat, or other symptoms of a cold or flu. Do not treat yourself. This drug decreases your body's ability to fight infections. Try to avoid being around people who are sick. Do not become pregnant while taking this medicine or for at least 12 months after stopping it. Women should inform their doctor if they wish to become pregnant or think they might be pregnant. There is a potential for serious side effects to an unborn child. Talk to your health care professional or pharmacist for more information. Do not breast-feed an infant while taking this medicine or for at   least 6 months after stopping it. What side effects may I notice from receiving this medicine? Side effects that you should report to your doctor or health care professional as soon as possible:  allergic reactions like skin rash, itching or  hives; swelling of the face, lips, or tongue  breathing problems  chest pain  changes in vision  diarrhea  headache with fever, neck stiffness, sensitivity to light, nausea, or confusion  fast, irregular heartbeat  loss of memory  low blood counts - this medicine may decrease the number of white blood cells, red blood cells and platelets. You may be at increased risk for infections and bleeding.  mouth sores  problems with balance, talking, or walking  redness, blistering, peeling or loosening of the skin, including inside the mouth  signs of infection - fever or chills, cough, sore throat, pain or difficulty passing urine  signs and symptoms of kidney injury like trouble passing urine or change in the amount of urine  signs and symptoms of liver injury like dark yellow or brown urine; general ill feeling or flu-like symptoms; light-colored stools; loss of appetite; nausea; right upper belly pain; unusually weak or tired; yellowing of the eyes or skin  signs and symptoms of low blood pressure like dizziness; feeling faint or lightheaded, falls; unusually weak or tired  stomach pain  swelling of the ankles, feet, hands  unusual bleeding or bruising  vomiting Side effects that usually do not require medical attention (report to your doctor or health care professional if they continue or are bothersome):  headache  joint pain  muscle cramps or muscle pain  nausea  tiredness This list may not describe all possible side effects. Call your doctor for medical advice about side effects. You may report side effects to FDA at 1-800-FDA-1088. Where should I keep my medicine? This drug is given in a hospital or clinic and will not be stored at home. NOTE: This sheet is a summary. It may not cover all possible information. If you have questions about this medicine, talk to your doctor, pharmacist, or health care provider.  2020 Elsevier/Gold Standard (2018-06-21  22:01:36) Cyclophosphamide injection What is this medicine? CYCLOPHOSPHAMIDE (sye kloe FOSS fa mide) is a chemotherapy drug. It slows the growth of cancer cells. This medicine is used to treat many types of cancer like lymphoma, myeloma, leukemia, breast cancer, and ovarian cancer, to name a few. This medicine may be used for other purposes; ask your health care provider or pharmacist if you have questions. COMMON BRAND NAME(S): Cytoxan, Neosar What should I tell my health care provider before I take this medicine? They need to know if you have any of these conditions:  blood disorders  history of other chemotherapy  infection  kidney disease  liver disease  recent or ongoing radiation therapy  tumors in the bone marrow  an unusual or allergic reaction to cyclophosphamide, other chemotherapy, other medicines, foods, dyes, or preservatives  pregnant or trying to get pregnant  breast-feeding How should I use this medicine? This drug is usually given as an injection into a vein or muscle or by infusion into a vein. It is administered in a hospital or clinic by a specially trained health care professional. Talk to your pediatrician regarding the use of this medicine in children. Special care may be needed. Overdosage: If you think you have taken too much of this medicine contact a poison control center or emergency room at once. NOTE: This medicine is only for you.  Do not share this medicine with others. What if I miss a dose? It is important not to miss your dose. Call your doctor or health care professional if you are unable to keep an appointment. What may interact with this medicine? This medicine may interact with the following medications:  amiodarone  amphotericin B  azathioprine  certain antiviral medicines for HIV or AIDS such as protease inhibitors (e.g., indinavir, ritonavir) and zidovudine  certain blood pressure medications such as benazepril, captopril,  enalapril, fosinopril, lisinopril, moexipril, monopril, perindopril, quinapril, ramipril, trandolapril  certain cancer medications such as anthracyclines (e.g., daunorubicin, doxorubicin), busulfan, cytarabine, paclitaxel, pentostatin, tamoxifen, trastuzumab  certain diuretics such as chlorothiazide, chlorthalidone, hydrochlorothiazide, indapamide, metolazone  certain medicines that treat or prevent blood clots like warfarin  certain muscle relaxants such as succinylcholine  cyclosporine  etanercept  indomethacin  medicines to increase blood counts like filgrastim, pegfilgrastim, sargramostim  medicines used as general anesthesia  metronidazole  natalizumab This list may not describe all possible interactions. Give your health care provider a list of all the medicines, herbs, non-prescription drugs, or dietary supplements you use. Also tell them if you smoke, drink alcohol, or use illegal drugs. Some items may interact with your medicine. What should I watch for while using this medicine? Visit your doctor for checks on your progress. This drug may make you feel generally unwell. This is not uncommon, as chemotherapy can affect healthy cells as well as cancer cells. Report any side effects. Continue your course of treatment even though you feel ill unless your doctor tells you to stop. Drink water or other fluids as directed. Urinate often, even at night. In some cases, you may be given additional medicines to help with side effects. Follow all directions for their use. Call your doctor or health care professional for advice if you get a fever, chills or sore throat, or other symptoms of a cold or flu. Do not treat yourself. This drug decreases your body's ability to fight infections. Try to avoid being around people who are sick. This medicine may increase your risk to bruise or bleed. Call your doctor or health care professional if you notice any unusual bleeding. Be careful brushing  and flossing your teeth or using a toothpick because you may get an infection or bleed more easily. If you have any dental work done, tell your dentist you are receiving this medicine. You may get drowsy or dizzy. Do not drive, use machinery, or do anything that needs mental alertness until you know how this medicine affects you. Do not become pregnant while taking this medicine or for 1 year after stopping it. Women should inform their doctor if they wish to become pregnant or think they might be pregnant. Men should not father a child while taking this medicine and for 4 months after stopping it. There is a potential for serious side effects to an unborn child. Talk to your health care professional or pharmacist for more information. Do not breast-feed an infant while taking this medicine. This medicine may interfere with the ability to have a child. This medicine has caused ovarian failure in some women. This medicine has caused reduced sperm counts in some men. You should talk with your doctor or health care professional if you are concerned about your fertility. If you are going to have surgery, tell your doctor or health care professional that you have taken this medicine. What side effects may I notice from receiving this medicine? Side effects that you should report  to your doctor or health care professional as soon as possible:  allergic reactions like skin rash, itching or hives, swelling of the face, lips, or tongue  low blood counts - this medicine may decrease the number of white blood cells, red blood cells and platelets. You may be at increased risk for infections and bleeding.  signs of infection - fever or chills, cough, sore throat, pain or difficulty passing urine  signs of decreased platelets or bleeding - bruising, pinpoint red spots on the skin, black, tarry stools, blood in the urine  signs of decreased red blood cells - unusually weak or tired, fainting spells,  lightheadedness  breathing problems  dark urine  dizziness  palpitations  swelling of the ankles, feet, hands  trouble passing urine or change in the amount of urine  weight gain  yellowing of the eyes or skin Side effects that usually do not require medical attention (report to your doctor or health care professional if they continue or are bothersome):  changes in nail or skin color  hair loss  missed menstrual periods  mouth sores  nausea, vomiting This list may not describe all possible side effects. Call your doctor for medical advice about side effects. You may report side effects to FDA at 1-800-FDA-1088. Where should I keep my medicine? This drug is given in a hospital or clinic and will not be stored at home. NOTE: This sheet is a summary. It may not cover all possible information. If you have questions about this medicine, talk to your doctor, pharmacist, or health care provider.  2020 Elsevier/Gold Standard (2012-03-24 16:22:58) Vincristine injection What is this medicine? VINCRISTINE (vin KRIS teen) is a chemotherapy drug. It slows the growth of cancer cells. This medicine is used to treat many types of cancer like Hodgkin's disease, leukemia, non-Hodgkin's lymphoma, neuroblastoma (brain cancer), rhabdomyosarcoma, and Wilms' tumor. This medicine may be used for other purposes; ask your health care provider or pharmacist if you have questions. COMMON BRAND NAME(S): Oncovin, Vincasar PFS What should I tell my health care provider before I take this medicine? They need to know if you have any of these conditions:  blood disorders  gout  infection (especially chickenpox, cold sores, or herpes)  kidney disease  liver disease  lung disease  nervous system disease like Charcot-Marie-Tooth (CMT)  recent or ongoing radiation therapy  an unusual or allergic reaction to vincristine, other chemotherapy agents, other medicines, foods, dyes, or  preservatives  pregnant or trying to get pregnant  breast-feeding How should I use this medicine? This drug is given as an infusion into a vein. It is administered in a hospital or clinic by a specially trained health care professional. If you have pain, swelling, burning, or any unusual feeling around the site of your injection, tell your health care professional right away. Talk to your pediatrician regarding the use of this medicine in children. While this drug may be prescribed for selected conditions, precautions do apply. Overdosage: If you think you have taken too much of this medicine contact a poison control center or emergency room at once. NOTE: This medicine is only for you. Do not share this medicine with others. What if I miss a dose? It is important not to miss your dose. Call your doctor or health care professional if you are unable to keep an appointment. What may interact with this medicine? Do not take this medicine with any of the following medications:  itraconazole  mibefradil  voriconazole This medicine may  also interact with the following medications:  cyclosporine  erythromycin  fluconazole  ketoconazole  medicines for HIV like delavirdine, efavirenz, nevirapine  medicines for seizures like ethotoin, fosphenotoin, phenytoin  medicines to increase blood counts like filgrastim, pegfilgrastim, sargramostim  other chemotherapy drugs like cisplatin, L-asparaginase, methotrexate, mitomycin, paclitaxel  pegaspargase  vaccines  zalcitabine, ddC Talk to your doctor or health care professional before taking any of these medicines:  acetaminophen  aspirin  ibuprofen  ketoprofen  naproxen This list may not describe all possible interactions. Give your health care provider a list of all the medicines, herbs, non-prescription drugs, or dietary supplements you use. Also tell them if you smoke, drink alcohol, or use illegal drugs. Some items may interact  with your medicine. What should I watch for while using this medicine? Your condition will be monitored carefully while you are receiving this medicine. You will need important blood work done while you are taking this medicine. This drug may make you feel generally unwell. This is not uncommon, as chemotherapy can affect healthy cells as well as cancer cells. Report any side effects. Continue your course of treatment even though you feel ill unless your doctor tells you to stop. In some cases, you may be given additional medicines to help with side effects. Follow all directions for their use. Call your doctor or health care professional for advice if you get a fever, chills or sore throat, or other symptoms of a cold or flu. Do not treat yourself. Avoid taking products that contain aspirin, acetaminophen, ibuprofen, naproxen, or ketoprofen unless instructed by your doctor. These medicines may hide a fever. Do not become pregnant while taking this medicine. Women should inform their doctor if they wish to become pregnant or think they might be pregnant. There is a potential for serious side effects to an unborn child. Talk to your health care professional or pharmacist for more information. Do not breast-feed an infant while taking this medicine. Men may have a lower sperm count while taking this medicine. Talk to your doctor if you plan to father a child. What side effects may I notice from receiving this medicine? Side effects that you should report to your doctor or health care professional as soon as possible:  allergic reactions like skin rash, itching or hives, swelling of the face, lips, or tongue  breathing problems  confusion or changes in emotions or moods  constipation  cough  mouth sores  muscle weakness  nausea and vomiting  pain, swelling, redness or irritation at the injection site  pain, tingling, numbness in the hands or feet  problems with balance, talking,  walking  seizures  stomach pain  trouble passing urine or change in the amount of urine Side effects that usually do not require medical attention (report to your doctor or health care professional if they continue or are bothersome):  diarrhea  hair loss  jaw pain  loss of appetite This list may not describe all possible side effects. Call your doctor for medical advice about side effects. You may report side effects to FDA at 1-800-FDA-1088. Where should I keep my medicine? This drug is given in a hospital or clinic and will not be stored at home. NOTE: This sheet is a summary. It may not cover all possible information. If you have questions about this medicine, talk to your doctor, pharmacist, or health care provider.  2020 Elsevier/Gold Standard (2008-02-05 17:17:13) Doxorubicin injection What is this medicine? DOXORUBICIN (dox oh ROO bi sin) is a  chemotherapy drug. It is used to treat many kinds of cancer like leukemia, lymphoma, neuroblastoma, sarcoma, and Wilms' tumor. It is also used to treat bladder cancer, breast cancer, lung cancer, ovarian cancer, stomach cancer, and thyroid cancer. This medicine may be used for other purposes; ask your health care provider or pharmacist if you have questions. COMMON BRAND NAME(S): Adriamycin, Adriamycin PFS, Adriamycin RDF, Rubex What should I tell my health care provider before I take this medicine? They need to know if you have any of these conditions:  heart disease  history of low blood counts caused by a medicine  liver disease  recent or ongoing radiation therapy  an unusual or allergic reaction to doxorubicin, other chemotherapy agents, other medicines, foods, dyes, or preservatives  pregnant or trying to get pregnant  breast-feeding How should I use this medicine? This drug is given as an infusion into a vein. It is administered in a hospital or clinic by a specially trained health care professional. If you have pain,  swelling, burning or any unusual feeling around the site of your injection, tell your health care professional right away. Talk to your pediatrician regarding the use of this medicine in children. Special care may be needed. Overdosage: If you think you have taken too much of this medicine contact a poison control center or emergency room at once. NOTE: This medicine is only for you. Do not share this medicine with others. What if I miss a dose? It is important not to miss your dose. Call your doctor or health care professional if you are unable to keep an appointment. What may interact with this medicine? This medicine may interact with the following medications:  6-mercaptopurine  paclitaxel  phenytoin  St. John's Wort  trastuzumab  verapamil This list may not describe all possible interactions. Give your health care provider a list of all the medicines, herbs, non-prescription drugs, or dietary supplements you use. Also tell them if you smoke, drink alcohol, or use illegal drugs. Some items may interact with your medicine. What should I watch for while using this medicine? This drug may make you feel generally unwell. This is not uncommon, as chemotherapy can affect healthy cells as well as cancer cells. Report any side effects. Continue your course of treatment even though you feel ill unless your doctor tells you to stop. There is a maximum amount of this medicine you should receive throughout your life. The amount depends on the medical condition being treated and your overall health. Your doctor will watch how much of this medicine you receive in your lifetime. Tell your doctor if you have taken this medicine before. You may need blood work done while you are taking this medicine. Your urine may turn red for a few days after your dose. This is not blood. If your urine is dark or brown, call your doctor. In some cases, you may be given additional medicines to help with side effects.  Follow all directions for their use. Call your doctor or health care professional for advice if you get a fever, chills or sore throat, or other symptoms of a cold or flu. Do not treat yourself. This drug decreases your body's ability to fight infections. Try to avoid being around people who are sick. This medicine may increase your risk to bruise or bleed. Call your doctor or health care professional if you notice any unusual bleeding. Talk to your doctor about your risk of cancer. You may be more at risk for certain  types of cancers if you take this medicine. Do not become pregnant while taking this medicine or for 6 months after stopping it. Women should inform their doctor if they wish to become pregnant or think they might be pregnant. Men should not father a child while taking this medicine and for 6 months after stopping it. There is a potential for serious side effects to an unborn child. Talk to your health care professional or pharmacist for more information. Do not breast-feed an infant while taking this medicine. This medicine has caused ovarian failure in some women and reduced sperm counts in some men This medicine may interfere with the ability to have a child. Talk with your doctor or health care professional if you are concerned about your fertility. This medicine may cause a decrease in Co-Enzyme Q-10. You should make sure that you get enough Co-Enzyme Q-10 while you are taking this medicine. Discuss the foods you eat and the vitamins you take with your health care professional. What side effects may I notice from receiving this medicine? Side effects that you should report to your doctor or health care professional as soon as possible:  allergic reactions like skin rash, itching or hives, swelling of the face, lips, or tongue  breathing problems  chest pain  fast or irregular heartbeat  low blood counts - this medicine may decrease the number of white blood cells, red blood cells  and platelets. You may be at increased risk for infections and bleeding.  pain, redness, or irritation at site where injected  signs of infection - fever or chills, cough, sore throat, pain or difficulty passing urine  signs of decreased platelets or bleeding - bruising, pinpoint red spots on the skin, black, tarry stools, blood in the urine  swelling of the ankles, feet, hands  tiredness  weakness Side effects that usually do not require medical attention (report to your doctor or health care professional if they continue or are bothersome):  diarrhea  hair loss  mouth sores  nail discoloration or damage  nausea  red colored urine  vomiting This list may not describe all possible side effects. Call your doctor for medical advice about side effects. You may report side effects to FDA at 1-800-FDA-1088. Where should I keep my medicine? This drug is given in a hospital or clinic and will not be stored at home. NOTE: This sheet is a summary. It may not cover all possible information. If you have questions about this medicine, talk to your doctor, pharmacist, or health care provider.  2020 Elsevier/Gold Standard (2016-12-22 11:01:26)

## 2018-11-29 NOTE — Patient Instructions (Signed)

## 2018-12-01 ENCOUNTER — Other Ambulatory Visit: Payer: Self-pay

## 2018-12-01 ENCOUNTER — Inpatient Hospital Stay: Payer: Medicare HMO

## 2018-12-01 VITALS — BP 135/78 | HR 75 | Temp 97.0°F | Resp 20

## 2018-12-01 DIAGNOSIS — Z79899 Other long term (current) drug therapy: Secondary | ICD-10-CM | POA: Diagnosis not present

## 2018-12-01 DIAGNOSIS — E1140 Type 2 diabetes mellitus with diabetic neuropathy, unspecified: Secondary | ICD-10-CM | POA: Diagnosis not present

## 2018-12-01 DIAGNOSIS — C8338 Diffuse large B-cell lymphoma, lymph nodes of multiple sites: Secondary | ICD-10-CM | POA: Diagnosis not present

## 2018-12-01 DIAGNOSIS — R590 Localized enlarged lymph nodes: Secondary | ICD-10-CM | POA: Diagnosis not present

## 2018-12-01 DIAGNOSIS — Z5111 Encounter for antineoplastic chemotherapy: Secondary | ICD-10-CM | POA: Diagnosis not present

## 2018-12-01 MED ORDER — PEGFILGRASTIM-CBQV 6 MG/0.6ML ~~LOC~~ SOSY
PREFILLED_SYRINGE | SUBCUTANEOUS | Status: AC
  Filled 2018-12-01: qty 0.6

## 2018-12-01 MED ORDER — PEGFILGRASTIM-CBQV 6 MG/0.6ML ~~LOC~~ SOSY
6.0000 mg | PREFILLED_SYRINGE | Freq: Once | SUBCUTANEOUS | Status: AC
  Administered 2018-12-01: 15:00:00 6 mg via SUBCUTANEOUS

## 2018-12-01 NOTE — Patient Instructions (Signed)

## 2018-12-04 ENCOUNTER — Telehealth: Payer: Self-pay | Admitting: *Deleted

## 2018-12-04 NOTE — Telephone Encounter (Signed)
Call received from patient's wife stating that patient is having slight constipation and would like to know what patient should take.  Pt.s wife instructed to take OTC Senokot one to two tablets a day as needed for constipation per order of S. [place] NP.  Pt.'s wife appreciative of information and has no further questions at this time.

## 2018-12-05 DIAGNOSIS — G4733 Obstructive sleep apnea (adult) (pediatric): Secondary | ICD-10-CM | POA: Diagnosis not present

## 2018-12-13 ENCOUNTER — Other Ambulatory Visit: Payer: Self-pay | Admitting: Orthopaedic Surgery

## 2018-12-13 ENCOUNTER — Emergency Department (HOSPITAL_BASED_OUTPATIENT_CLINIC_OR_DEPARTMENT_OTHER): Payer: Medicare HMO

## 2018-12-13 ENCOUNTER — Encounter (HOSPITAL_BASED_OUTPATIENT_CLINIC_OR_DEPARTMENT_OTHER): Payer: Self-pay | Admitting: Emergency Medicine

## 2018-12-13 ENCOUNTER — Emergency Department (HOSPITAL_BASED_OUTPATIENT_CLINIC_OR_DEPARTMENT_OTHER)
Admission: EM | Admit: 2018-12-13 | Discharge: 2018-12-13 | Disposition: A | Discharge status: Home / Self Care | Payer: Medicare HMO | Attending: Emergency Medicine | Admitting: Emergency Medicine

## 2018-12-13 ENCOUNTER — Telehealth: Payer: Self-pay | Admitting: *Deleted

## 2018-12-13 ENCOUNTER — Other Ambulatory Visit: Payer: Self-pay

## 2018-12-13 DIAGNOSIS — E119 Type 2 diabetes mellitus without complications: Secondary | ICD-10-CM | POA: Diagnosis not present

## 2018-12-13 DIAGNOSIS — X509XXA Other and unspecified overexertion or strenuous movements or postures, initial encounter: Secondary | ICD-10-CM | POA: Diagnosis not present

## 2018-12-13 DIAGNOSIS — T84117A Breakdown (mechanical) of internal fixation device of bone of left lower leg, initial encounter: Secondary | ICD-10-CM | POA: Diagnosis not present

## 2018-12-13 DIAGNOSIS — R52 Pain, unspecified: Secondary | ICD-10-CM | POA: Diagnosis not present

## 2018-12-13 DIAGNOSIS — Z87891 Personal history of nicotine dependence: Secondary | ICD-10-CM | POA: Diagnosis not present

## 2018-12-13 DIAGNOSIS — S8252XA Displaced fracture of medial malleolus of left tibia, initial encounter for closed fracture: Secondary | ICD-10-CM | POA: Diagnosis not present

## 2018-12-13 DIAGNOSIS — Y999 Unspecified external cause status: Secondary | ICD-10-CM | POA: Insufficient documentation

## 2018-12-13 DIAGNOSIS — Y9389 Activity, other specified: Secondary | ICD-10-CM | POA: Diagnosis not present

## 2018-12-13 DIAGNOSIS — I259 Chronic ischemic heart disease, unspecified: Secondary | ICD-10-CM | POA: Diagnosis not present

## 2018-12-13 DIAGNOSIS — I10 Essential (primary) hypertension: Secondary | ICD-10-CM | POA: Diagnosis not present

## 2018-12-13 DIAGNOSIS — Z743 Need for continuous supervision: Secondary | ICD-10-CM | POA: Diagnosis not present

## 2018-12-13 DIAGNOSIS — S82892A Other fracture of left lower leg, initial encounter for closed fracture: Secondary | ICD-10-CM

## 2018-12-13 DIAGNOSIS — R5381 Other malaise: Secondary | ICD-10-CM | POA: Diagnosis not present

## 2018-12-13 DIAGNOSIS — R000 Tachycardia, unspecified: Secondary | ICD-10-CM | POA: Diagnosis not present

## 2018-12-13 DIAGNOSIS — S8262XA Displaced fracture of lateral malleolus of left fibula, initial encounter for closed fracture: Secondary | ICD-10-CM | POA: Diagnosis not present

## 2018-12-13 DIAGNOSIS — Y92003 Bedroom of unspecified non-institutional (private) residence as the place of occurrence of the external cause: Secondary | ICD-10-CM | POA: Diagnosis not present

## 2018-12-13 DIAGNOSIS — R279 Unspecified lack of coordination: Secondary | ICD-10-CM | POA: Diagnosis not present

## 2018-12-13 DIAGNOSIS — S99912A Unspecified injury of left ankle, initial encounter: Secondary | ICD-10-CM | POA: Diagnosis present

## 2018-12-13 DIAGNOSIS — W19XXXA Unspecified fall, initial encounter: Secondary | ICD-10-CM | POA: Diagnosis not present

## 2018-12-13 MED ORDER — OXYCODONE HCL 5 MG PO TABS: 5.0000 mg | ORAL_TABLET | ORAL | 0 refills | Status: DC | PRN

## 2018-12-13 MED ORDER — OXYCODONE HCL 5 MG PO TABS
5.0000 mg | ORAL_TABLET | Freq: Once | ORAL | Status: AC
  Administered 2018-12-13: 5 mg via ORAL
  Filled 2018-12-13: qty 1

## 2018-12-13 NOTE — ED Triage Notes (Signed)
Pt stepped out of bed this morning at 7am and his left leg "gave out" and he fell to the floor, re-injuring his left ankle. Pt took Tramadol 50mg  po at approx 8am.  Tib/fib fx and surgery in May of this year to same leg.

## 2018-12-13 NOTE — Telephone Encounter (Signed)
Pet scan moved to 8/14 at 12pm. Pt to arrive at 1130, NPO 6 hrs prior.  Unable to reach pt wife, lmovm. Notified pt wife of schedule change.

## 2018-12-13 NOTE — ED Provider Notes (Addendum)
[facility] Emergency Department Provider Note MRN:  [id number]  Arrival date & time: 12/13/18     Chief Complaint   Ankle Injury   History of Present Illness   Ronald Rice is a 73 y.o. year-old male with a history of lymphoma presenting to the ED with chief complaint of ankle injury.  Patient explains that he had a fall a few months ago and broke his left ankle, requiring surgery with hardware in place.  He explains that he was getting out of bed today, put weight on this ankle, and felt the ankle buckle.  This caused him to slide onto the floor.  Denies any other trauma or pain, no head trauma, no loss of consciousness, no chest pain or shortness of breath, no dizziness, no abdominal pain.  Pain is located in the left ankle and lower left leg and foot, moderate in severity, took tramadol prior to EMS arrival, worse with motion or palpation.  Review of Systems  A complete 10 system review of systems was obtained and all systems are negative except as noted in the HPI and PMH.   Patient's Health History    Past Medical History:  Diagnosis Date   Cancer ([place])    Leukemia    CLL (chronic lymphocytic leukemia) ([place])    see records in care everywhere from [facility]   Coronary artery disease    minimal nonobstructive by 10/31/16 cath at [facility] in [place]   Diabetes mellitus without complication ([place])    Diffuse large B-cell lymphoma of lymph nodes of multiple regions ([place]) 10/31/2018   GERD (gastroesophageal reflux disease)    Goals of care, counseling/discussion 10/31/2018   Hypertension    Sleep apnea    does not wear CPAP   Wears dentures    Wears glasses     Past Surgical History:  Procedure Laterality Date   ANTERIOR CERVICAL DECOMP/DISCECTOMY FUSION N/A 04/20/2017   Procedure: ANTERIOR CERVICAL DECOMPRESSION FUSION, CERVICAL 3-4, CERVICAL 4-5 WITH INSTRUMENTATION AND ALLOGRAFT; REQUEST 3.5 HOURS;  Surgeon: Phylliss Bob, MD;  Location: [facility];  Service: Orthopedics;  Laterality: N/A;  ANTERIOR CERVICAL DECOMPRESSION FUSION, CERVICAL 3-4, CERVICAL 4-5 WITH INSTRUMENTATION AND ALLOGRAFT; REQUEST 3.5 HOURS   BACK SURGERY     CARDIAC CATHETERIZATION     in Care Everywhere 11/01/16   COLONOSCOPY     IR IMAGING GUIDED PORT INSERTION  11/10/2018   MULTIPLE TOOTH EXTRACTIONS     ORIF ANKLE FRACTURE Left 09/13/2018   Procedure: OPEN REDUCTION INTERNAL FIXATION (ORIF) LATERAL LEFT ANKLE FRACTURE;  Surgeon: Dorna Leitz, MD;  Location: [facility];  Service: Orthopedics;  Laterality: Left;   SYNDESMOSIS REPAIR Left 09/13/2018   Procedure: OPEN REDUCTION INTERNAL FIXATION SYNDESMOSIS REPAIR LEFT ANKLE;  Surgeon: Dorna Leitz, MD;  Location: [facility];  Service: Orthopedics;  Laterality: Left;    Family History  Problem Relation Age of Onset   Heart attack Mother    Diabetes Sister    Diabetes Brother     Social History   Socioeconomic History   Marital status: Single    Spouse name: Not on file   Number of children: 1   Years of education: 14   Highest education level: Not on file  Occupational History   Occupation: Retired  Scientist, product/process development strain: Not on file   Food insecurity    Worry: Not on file    Inability: Not on file   Transportation needs    Medical: Not on file  Non-medical: Not on file  Tobacco Use   Smoking status: Former Smoker    Types: Pipe, Cigars, Cigarettes    Quit date: 10/31/1989    Years since quitting: 29.1   Smokeless tobacco: Never Used  Substance and Sexual Activity   Alcohol use: Yes    Frequency: Never    Comment: occasional   Drug use: No   Sexual activity: Not on file  Lifestyle   Physical activity    Days per week: Not on file    Minutes per session: Not on file   Stress: Not on file  Relationships   Social connections    Talks on phone: Not on file    Gets together: Not on file    Attends religious service: Not on file    Active member of club  or organization: Not on file    Attends meetings of clubs or organizations: Not on file    Relationship status: Not on file   Intimate partner violence    Fear of current or ex partner: Not on file    Emotionally abused: Not on file    Physically abused: Not on file    Forced sexual activity: Not on file  Other Topics Concern   Not on file  Social History Narrative   Lives with wife   Caffeine use: Drinks coffee daily (2 cups)   Right handed      Physical Exam  Vital Signs and Nursing Notes reviewed Vitals:   12/13/18 0909 12/13/18 1154  BP: (!) 139/96 126/82  Pulse: (!) 111 100  Resp: 16 16  Temp: 98.3 F (36.8 C)   SpO2: 98% 97%    CONSTITUTIONAL: Well-appearing, NAD NEURO:  Alert and oriented x 3, no focal deficits EYES:  eyes equal and reactive ENT/NECK:  no LAD, no JVD CARDIO: Regular rate, well-perfused, normal S1 and S2 PULM:  CTAB no wheezing or rhonchi GI/GU:  normal bowel sounds, non-distended, non-tender MSK/SPINE: Left foot is neurovascularly intact, there is noted swelling and tenderness to the medial malleolus SKIN:  no rash, atraumatic PSYCH:  Appropriate speech and behavior  Diagnostic and Interventional Summary    Labs Reviewed - No data to display  DG Foot Complete Left  Final Result    DG Ankle Complete Left  Final Result    DG Tibia/Fibula Left  Final Result      Medications - No data to display   Procedures Critical Care  ED Course and Medical Decision Making  I have reviewed the triage vital signs and the nursing notes.  Pertinent labs & imaging results that were available during my care of the patient were reviewed by me and considered in my medical decision making (see below for details).  Concern for recurrent fracture or hardware failure in this 73 year old male with recent surgical repair of this ankle, x-rays are pending.  X-ray reveals bent hardware and new fracture near the medial malleolus.  Discussed with his orthopedic  surgeon Dr. Berenice Primas, who is happy to see patient in clinic tomorrow.  Recommending posterior splint.  Patient is a poor candidate for crutches based on our crutch trial here in the emergency department.  Case management was consulted and we are able to provide a wheelchair.  Patient explains that he will have family that will be able to aid him and get him to his appointment tomorrow.  After the discussed management above, the patient was determined to be safe for discharge.  The patient was in agreement with  this plan and all questions regarding their care were answered.  ED return precautions were discussed and the patient will return to the ED with any significant worsening of condition.     Durable Medical Equipment  (From admission, onward)         Start     Ordered   12/13/18 1210  For home use only DME lightweight manual wheelchair with seat cushion  Once    Comments: Patient suffers from bathing and dressingleft ankle fracture which impairs their ability to perform daily activities like bathing and dressing in the home.  A crutch will not resolve  issue with performing activities of daily living. A wheelchair will allow patient to safely perform daily activities. Patient is not able to propel themselves in the home using a standard weight wheelchair due to arm weakness. Patient can self propel in the lightweight wheelchair. Length of need 6 months. Accessories: elevating leg rests (ELRs), wheel locks, extensions and anti-tippers.  Please also include back cushion.   12/13/18 [place] Small, MD [place] [email or web address]  Final Clinical Impressions(s) / ED Diagnoses     ICD-10-CM   1. Closed fracture of left ankle, initial encounter  S82.892A     ED Discharge Orders    None         Maudie Flakes, MD 12/13/18 1212    Maudie Flakes, MD 12/13/18 1220

## 2018-12-13 NOTE — ED Notes (Signed)
ED Provider at bedside. 

## 2018-12-13 NOTE — TOC Initial Note (Addendum)
Transition of Care [facility]) - Initial/Assessment Note    Patient Details  Name: Ronald Rice MRN: [id number] Date of Birth: Mar 03, 1946  [facility] CM/SW Contact:    Erenest Rasher, RN Phone Number: [phone] 12/13/2018, 1:30 PM  Clinical Narrative:                 Per notes, pt's wife is primary caregiver and permission was given to speak to wife. Contacted wife via phone. States pt had HH with [facility] in the past. She wanted to keep [facility]. Contacted Santiago Glad, rep with new referral. [place] RN can do a soc from tomorrow. And HH PT on Friday. [place] for wheelchair to be delivered to home today for his appt tomorrow. Wife will need wheelchair transport. Provided her with number for [facility] [phone]. Explained it will be out of pocket. She can contact [employer] to see if they will assist with reimbursement. [facility] referral for complex case. Wife has TOC CM telephone number to call with any questions.   [facility] arranged for transport.  Expected Discharge Plan: [place] Barriers to Discharge: No Barriers Identified   Patient Goals and CMS Choice Patient states their goals for this hospitalization and ongoing recovery are:: concerned about transportation CMS Medicare.gov Compare Post Acute Care list provided to:: Patient Represenative (must comment)([name] Duanne Moron) Choice offered to / list presented to : Spouse  Expected Discharge Plan and Services Expected Discharge Plan: [place] In-house Referral: [facility] Discharge Planning Services: CM Consult Post Acute Care Choice: [place] arrangements for the past 2 months: Single Family Home                 DME Arranged: Youth worker wheelchair with seat cushion DME Agency: [employer] Date DME Agency Contacted: 12/13/18 Time DME Agency Contacted: 0867 Representative spoke with at DME Agency: Dawayne Patricia HH Arranged: RN, PT, OT, Nurse's Aide, Social Work [employer] Agency:  [place] ([place]) Date [place]: 12/13/18 Time [place]: [place] Representative spoke with at [place]: [place]  Prior Living Arrangements/Services Living arrangements for the past 2 months: [place] with:: Spouse Patient language and need for interpreter reviewed:: Yes Do you feel safe going back to the place where you live?: Yes      Need for Family Participation in Patient Care: Yes (Comment) Care giver support system in place?: Yes (comment) Current home services: DME([place], bedside commode) Criminal Activity/Legal Involvement Pertinent to Current Situation/Hospitalization: No - Comment as needed  Activities of Daily Living      Permission Sought/Granted Permission sought to share information with : Case Manager Permission granted to share information with : Yes, Verbal Permission Granted  Share Information with NAME: [name] Kleinert  Permission granted to share info w AGENCY: [employer] granted to share info w Relationship: wife  Permission granted to share info w Contact Information: [phone]  Emotional Assessment           Psych Involvement: No (comment)  Admission diagnosis:  Fall Patient Active Problem List   Diagnosis Date Noted  . Goals of care, counseling/discussion 10/31/2018  . Diffuse large B-cell lymphoma of lymph nodes of multiple regions ([place]) 10/31/2018  . Cellulitis of left leg 10/27/2018  . Displaced fracture of lateral malleolus of left fibula 09/13/2018  . Dream enactment behavior 07/26/2018  . Snoring 07/26/2018  . Hypersomnia with sleep apnea 07/26/2018  . Fever   .  Acute cystitis with hematuria   . Ischemic stroke ([place]) 11/22/2017  . Right hip pain 07/18/2017  . Cerebellar ataxia in diseases classified elsewhere ([place]) 07/18/2017  . Radiculopathy 04/20/2017  . Chronic lymphoid leukemia ([place]) 01/25/2017  . CAD (coronary artery disease) 10/31/2016   . Influenza A 05/24/2016  . Acute bronchitis 05/22/2016  . Weakness 05/22/2016  . Sepsis ([place]) 05/22/2016  . Pedal edema 05/22/2016  . Gait abnormality 07/28/2015  . Nonspecific elevation of levels of transaminase and lactic acid dehydrogenase (LDH) 03/02/2015  . Pseudarthrosis after fusion or arthrodesis 06/03/2014  . Fusion of spine of cervical region 06/05/2013  . Fusion of lumbar spine 03/07/2012  . Benign prostatic hyperplasia 06/15/2011  . Liver cyst 07/29/2009  . Kidney cyst, acquired 06/17/2009  . Type 2 diabetes mellitus without complications ([place]) 40/37/5436  . Diverticulosis of colon 08/03/2007  . Hyperlipidemia 03/14/2007  . Neuropathy, peripheral 12/03/2005  . Allergic rhinitis 03/01/2000  . Essential (primary) hypertension 10/03/1996  . GERD (gastroesophageal reflux disease) 07/02/1995   PCP:  Deland Pretty, MD Pharmacy:   [id number] - [place], [place] - [address] AT [place] [address] [place] [address] Phone: [phone] Fax: [phone]     Social Determinants of Health (SDOH) Interventions    Readmission Risk Interventions No flowsheet data found.

## 2018-12-13 NOTE — Discharge Instructions (Addendum)
You were evaluated in the Emergency Department and after careful evaluation, we did not find any emergent condition requiring admission or further testing in the hospital.  Your symptoms today seem to be due to a broken bone in the ankle.  Please follow-up in Dr. Berenice Primas clinic for evaluation tomorrow morning.  You can use big wheel transport to schedule a ride tomorrow, they can assist you in and out of your home.  Their number is [phone].  Please return to the Emergency Department if you experience any worsening of your condition.  We encourage you to follow up with a primary care provider.  Thank you for allowing [place] to be a part of your care.

## 2018-12-15 ENCOUNTER — Other Ambulatory Visit: Payer: Self-pay | Admitting: *Deleted

## 2018-12-15 DIAGNOSIS — S8262XD Displaced fracture of lateral malleolus of left fibula, subsequent encounter for closed fracture with routine healing: Secondary | ICD-10-CM | POA: Diagnosis not present

## 2018-12-15 DIAGNOSIS — R6889 Other general symptoms and signs: Secondary | ICD-10-CM | POA: Diagnosis not present

## 2018-12-15 NOTE — Patient Outreach (Signed)
[facility]) Care Management  12/15/2018  Fannie Alomar 1946/03/08 [id number]    Referral received 12/13/2018 Initial Outreach 12/15/2018  RN attempted the initial outreach however unsuccessful. RN able to leave a HIPAA approved voice message requesting a call back.  Will follow up within the next 4 business days for pending [facility] services.  Raina Mina, RN Care Management Coordinator [place] Office [phone]

## 2018-12-18 ENCOUNTER — Encounter (HOSPITAL_COMMUNITY): Payer: Self-pay | Admitting: *Deleted

## 2018-12-18 ENCOUNTER — Ambulatory Visit: Payer: Self-pay | Admitting: Family Medicine

## 2018-12-18 ENCOUNTER — Other Ambulatory Visit: Payer: Self-pay

## 2018-12-18 DIAGNOSIS — G4731 Primary central sleep apnea: Secondary | ICD-10-CM | POA: Insufficient documentation

## 2018-12-18 MED ORDER — DEXTROSE 5 % IV SOLN
3.0000 g | INTRAVENOUS | Status: AC
  Administered 2018-12-19: 3 g via INTRAVENOUS
  Filled 2018-12-18: qty 3
  Filled 2018-12-18: qty 3000

## 2018-12-18 NOTE — Anesthesia Preprocedure Evaluation (Addendum)
Anesthesia Evaluation  Patient identified by MRN, date of birth, ID band Patient awake    Reviewed: NPO status , Patient's Chart, lab work & pertinent test results, reviewed documented beta blocker date and time   History of Anesthesia Complications Negative for: history of anesthetic complications  Airway Mallampati: II  TM Distance: >3 FB Neck ROM: Full    Dental  (+) Edentulous Upper, Poor Dentition, Partial Lower, Dental Advisory Given   Pulmonary sleep apnea (does not use CPAP) , former smoker,  12/19/2018 SARS coronavirus NEG   breath sounds clear to auscultation       Cardiovascular hypertension, Pt. on medications and Pt. on home beta blockers (-) angina ('18 cath: minimal, non-obstructive)+ CAD   Rhythm:Regular Rate:Normal  10/2018 ECHO: EF 60-65%, aortic valve sclerosis without stenosis   Neuro/Psych CVA    GI/Hepatic Neg liver ROS, GERD  Medicated and Controlled,  Endo/Other  diabetes (glu 166), Oral Hypoglycemic AgentsMorbid obesity  Renal/GU negative Renal ROS     Musculoskeletal   Abdominal (+) + obese,   Peds  Hematology B-cell lymphoma: chemo   Anesthesia Other Findings   Reproductive/Obstetrics                          Anesthesia Physical Anesthesia Plan  ASA: III  Anesthesia Plan: General   Post-op Pain Management: GA combined w/ Regional for post-op pain   Induction: Intravenous  PONV Risk Score and Plan: 2 and Ondansetron and Dexamethasone  Airway Management Planned: Oral ETT  Additional Equipment:   Intra-op Plan:   Post-operative Plan: Extubation in OR  Informed Consent: I have reviewed the patients History and Physical, chart, labs and discussed the procedure including the risks, benefits and alternatives for the proposed anesthesia with the patient or authorized representative who has indicated his/her understanding and acceptance.     Dental advisory  given  Plan Discussed with: CRNA and Surgeon  Anesthesia Plan Comments: (PAT note written 12/18/2018 by Myra Gianotti, [place] routine monitors, GETA with adductor and popliteal blocks for post op analgesia  )      Anesthesia Quick Evaluation

## 2018-12-18 NOTE — Progress Notes (Signed)
Pt denies SOB, chest pain, and being under the care of a cardiologist. Pt satted that PCP is Dr. Shelia Media. Pt denies having recent labs. Pt stated that he was not given pre-op instructions regarding Aspirin and will call the surgeon's office for instructions. Pt made aware to stop taking  Vitamins, Cinnamon, BS Defense, fish oil and herbal medications. Do not take any NSAIDs ie: Ibuprofen, Advil, Naproxen (Aleve), Motrin, Mobic, BC and Goody Powder.  Pt made aware to not take Metformin on DOS. Pt made aware to check BG every 2 hours prior to arrival to hospital on DOS. Pt made aware to treat a BG < 70 with 4 ounces of apple or cranberry juice, wait 15 minutes after intervention to recheck BG, if BG remains < 70, call Short Stay unit to speak with a nurse. Pt stated that he was told that he would be tested for COVID-19 on DOS; pt reminded to quarantine.  Coronavirus Screening  Pt denies that he and his spouse experienced the following symptoms:  Cough yes/no: No Fever (>100.53F)  yes/no: No Runny nose yes/no: No Sore throat yes/no: No Difficulty breathing/shortness of breath  yes/no: No  Have you or a family member traveled in the last 14 days and where? yes/no: No   Pt reminded that hospital visitation restrictions are in effect and the importance of the restrictions.   Pt verbalized understanding of all pre-op instructions.  PA, Anesthesiology, asked to review pt history; see note.

## 2018-12-18 NOTE — Progress Notes (Signed)
Anesthesia Chart Review: Ronald Rice    Case: [id number] Date/Time: 12/19/18 1114   Procedure: LEFT ANKLE REVISION FIXATION, REMOVAL OF HARDWARE, OPEN TREATMENT, SYNDESMOSIS, POSSIBLE ANKLE ARTHROTOMY, DELTOID LIGAMENT REPAIR (Left )   Anesthesia type: Choice   Pre-op diagnosis: LEFT ANKLE FRACTURE WITH RETAINED HARDWARE S82.84 K48.185U   Location: [facility] OR ROOM 07 / [place] [facility]   Surgeon: Erle Crocker, MD      DISCUSSION: Patient is a 73 year old male scheduled for the above procedure. He presented to ED on 09/10/18 following fall with left distal fibula fracture, s/p ORIF left ankle fracture 09/13/18. On 12/13/18, he presented to ED after he felt his left ankle buckled causing him to slide onto the floor when getting out of bed. Imaging revealed bent hardware and new fracture near the medial malleolus. Out-patient ortho arranged with now above surgery recommended.    History includes never smoker, CAD (minimal nonobstructive by 11/01/16 cath), HTN, DM2, CLL (diagnosed ~ 2006/2007; Rituxan ~ 2013-2015 & 2017; therapy resumed 11/10/18), GERD, C3-4/C5-6 ACDF (04/20/17), BPH (urosepsis 11/2017; presented with confusion and gait difficulty and given tPA, but brain MRI negative). BMI is consistent with obesity.  - Hospitalization 10/26/18-10/28/18 for fever and tachycardia. Initially concern for sepsis and started on empiric antibiotics. LLE xray concerning for cellulitis. No definite LLE DVT. Blood cultures negative. COVID-19 was negative. Chest x-ray unremarkable. CT showed with markedly enlarged cervical and supraclavicular lymph nodes concerning for lymphoma. Oncologist Dr. Marin Olp consulted. Left neck LN biopsy confirmed high grade B-cell lymphoma. Pre-therapy echo recommended and showed normal LVEF.   (Since then he was started back on chemotherapy cycles. Last 11/29/18: He received palonosetron HCl, fosaprepitant, dexamethasone, doxorubicin, vincristine, cyclophosphamide, rituximab on 11/29/18 and  pegfilgrastim 12/01/18.)  He is a Ronald Rice, so he will get updated labs and anesthesia team evaluation on the day of surgery.    PROVIDERS: Deland Pretty, MD is PCP  - Burney Gauze, MD is HEM-ONC. Last visit 11/29/18. Lajean Silvius, Asencion Partridge, MD is neurologist. Seen for sleep evaluation 06/28/18.    LABS: He is for updated labs on arrival. As of 11/29/18, Cr 1.04, glucose 260, WBC 5.6, H/H 12.5/35.4, PLT 178. Last A1c seen was 6.7, but over a year ago on 11/23/17.   IMAGES: DG left tibia and fibula, ankle, foot 12/13/18: IMPRESSION: 1. There is redemonstrated plate and screw fixation of an oblique, mildly displaced fracture of the distal left tibia and fibula. The plate is now bent, with new posterior angulation of the distal fibular fragment. 2. There is a new displaced fracture fragment of the medial malleolar tip and lateral and anterior widening of the ankle mortise. 3.  Extensive soft tissue edema about the left foot and ankle. 4. No fracture or dislocation of the proximal left tibia or fibula or left foot.   PET Scan 11/07/18: IMPRESSION: 1. Bulky left cervical, left supraclavicular, and left thoracic inlet lymphadenopathy is markedly hypermetabolic consistent with Deauville 4-5 activity. 2. Increased number of small lymph nodes in the right supraclavicular region, bilateral axillary regions, subpectoral regions, and mediastinum with Deauville 3 activity. 3. Mildly enlarged lymph nodes in the retroperitoneal space along both pelvic sidewalls show FDG accumulation compatible with Deauville 3 activity. 4. No splenic hypermetabolism. 5. Multiple large cystic lesions in the left kidney, distorting renal anatomy.   CT chest/abd/pelvis 10/27/18: IMPRESSION: 1. No acute CT findings of the abdomen or pelvis to explain right back pain. No evidence of urinary tract calculus or hydronephrosis. Normal appendix. Postoperative  findings of lumbosacral fusion. 2. Very bulky left  cervical and supraclavicular lymphadenopathy, better appreciated by Ronald-day CT examination of the neck. There are prominent bilateral axillary and mediastinal lymph nodes, largest pretracheal nodes measuring 1.7 cm in short axis (series 3, image 25). Numerous prominent gastrohepatic, retroperitoneal and mesenteric lymph nodes, increased in size compared to prior CT, largest mesenteric node measuring 2.8 cm in short axis (series 3, image 93). Constellation of findings is concerning for lymphoma. 3. Unchanged 6 mm pulmonary nodule of the left lung base (series 4, image 99). Attention on follow-up. 4. The left kidney is again grossly deformed by extremely bulky exophytic cysts, largest measuring at least 15.3 cm. Cysts may be symptomatic given size.   CT soft tissue neck 10/27/18: IMPRESSION: 1. Severe left-sided cervical and supraclavicular lymphadenopathy, including necrotic left level 2A node that measures 4.6 x 5.5 cm. There also numerous right-sided cervical lymph nodes, which are predominantly subcentimeter. Findings are most suggestive of lymphoma. 2. Small retropharyngeal effusion, likely reactive.   EKG: 10/26/18: Sinus tachycardia at 103 bpm  Borderline T abnormalities, inferior leads No significant change since last tracing Confirmed by Merrily Pew [phone]) on 10/27/2018 12:04:02 AM   CV: Echo 10/28/18: IMPRESSIONS  1. The left ventricle has normal systolic function with an ejection fraction of 60-65%. The cavity size was normal. Left ventricular diastolic Doppler parameters are consistent with impaired relaxation. LV globa longitudinal strain was done but  inaccurate due to poor endocardial tracking.  2. The right ventricle has normal systolic function. The cavity was normal. There is no increase in right ventricular wall thickness. Right ventricular systolic pressure could not be assessed.  3. The aortic valve is tricuspid. Mild sclerosis of the aortic valve. Aortic valve  regurgitation was not assessed by color flow Doppler.   Cardiac Cath 11/01/16 [facility]; done for abnormal stress test suspicious for apex and inferior wall ischemia):  1.Mild, non-obstructive CAD. -LMCA: mild disease -LAD: Mild luminal irregularities.myocardial bridge of the mid LAD -LCx: Mild luminal irregularities. -RCA: Mild non-obstructive disease. 2. Moderately elevated LVEDP (25 mmHg). 3.Recommendation:Medical therapy for CAD.   Past Medical History:  Diagnosis Date  . Cancer ([place])    Leukemia   . CLL (chronic lymphocytic leukemia) ([place])    see records in care everywhere from [place]  . Coronary artery disease    minimal nonobstructive by 10/31/16 cath at [facility] in [place]  . Diabetes mellitus without complication ([place])   . Diffuse large B-cell lymphoma of lymph nodes of multiple regions ([place]) 10/31/2018  . GERD (gastroesophageal reflux disease)   . Goals of care, counseling/discussion 10/31/2018  . Hypertension   . Sleep apnea    does not wear CPAP  . Wears dentures   . Wears glasses     Past Surgical History:  Procedure Laterality Date  . ANTERIOR CERVICAL DECOMP/DISCECTOMY FUSION N/A 04/20/2017   Procedure: ANTERIOR CERVICAL DECOMPRESSION FUSION, CERVICAL 3-4, CERVICAL 4-5 WITH INSTRUMENTATION AND ALLOGRAFT; REQUEST 3.5 HOURS;  Surgeon: Phylliss Bob, MD;  Location: [place];  Service: Orthopedics;  Laterality: N/A;  ANTERIOR CERVICAL DECOMPRESSION FUSION, CERVICAL 3-4, CERVICAL 4-5 WITH INSTRUMENTATION AND ALLOGRAFT; REQUEST 3.5 HOURS  . BACK SURGERY    . CARDIAC CATHETERIZATION     in Care Everywhere 11/01/16  . COLONOSCOPY    . IR IMAGING GUIDED PORT INSERTION  11/10/2018  . MULTIPLE TOOTH EXTRACTIONS    . ORIF ANKLE FRACTURE Left 09/13/2018   Procedure: OPEN REDUCTION INTERNAL FIXATION (ORIF) LATERAL LEFT ANKLE FRACTURE;  Surgeon: Berenice Primas,  Jenny Reichmann, MD;  Location: [place];  Service: Orthopedics;  Laterality: Left;  . SYNDESMOSIS REPAIR Left 09/13/2018    Procedure: OPEN REDUCTION INTERNAL FIXATION SYNDESMOSIS REPAIR LEFT ANKLE;  Surgeon: Dorna Leitz, MD;  Location: [place];  Service: Orthopedics;  Laterality: Left;    MEDICATIONS: . [START ON 12/19/2018] ceFAZolin (ANCEF) 3 g in dextrose 5 % 50 mL IVPB   . acetaminophen (TYLENOL) 500 MG tablet  . allopurinol (ZYLOPRIM) 300 MG tablet  . aspirin EC 81 MG tablet  . atenolol (TENORMIN) 25 MG tablet  . atorvastatin (LIPITOR) 40 MG tablet  . CIALIS 20 MG tablet  . CINNAMON PO  . diclofenac sodium (VOLTAREN) 1 % GEL  . diphenhydrAMINE (SIMPLY SLEEP) 25 MG tablet  . famotidine (PEPCID) 20 MG tablet  . hydrochlorothiazide (HYDRODIURIL) 25 MG tablet  . lidocaine-prilocaine (EMLA) cream  . lisinopril (ZESTRIL) 40 MG tablet  . LORazepam (ATIVAN) 0.5 MG tablet  . meloxicam (MOBIC) 15 MG tablet  . metFORMIN (GLUCOPHAGE-XR) 500 MG 24 hr tablet  . NON FORMULARY  . ondansetron (ZOFRAN) 8 MG tablet  . potassium chloride (K-DUR) 10 MEQ tablet  . predniSONE (DELTASONE) 20 MG tablet  . prochlorperazine (COMPAZINE) 10 MG tablet  . tamsulosin (FLOMAX) 0.4 MG CAPS capsule  . tiZANidine (ZANAFLEX) 4 MG tablet  . traMADol (ULTRAM) 50 MG tablet  . famciclovir (FAMVIR) 500 MG tablet  . HYDROcodone-acetaminophen (NORCO/VICODIN) 5-325 MG tablet  . loratadine (CLARITIN) 10 MG tablet  . oxyCODONE (ROXICODONE) 5 MG immediate release tablet    Myra Gianotti, PA-C Surgical Short Stay/Anesthesiology [facility] Phone [phone] [facility] Phone [phone] 12/18/2018 2:58 PM

## 2018-12-18 NOTE — Progress Notes (Deleted)
PATIENT: Ronald Rice DOB: 25-Aug-1945  REASON FOR VISIT: follow up HISTORY FROM: patient  No chief complaint on file.    HISTORY OF PRESENT ILLNESS: Today 12/18/18 Ronald Rice is a 73 y.o. male here today for follow up for complex sleep apnea on CPAP therapy.    HISTORY: (copied from Dr Dohmeier's note on 06/28/2018)  HPI:  Ronald Rice is a 73 y.o. male patient of Dr. Krista Blue and Deland Pretty, MD , seen here for sleep evaluation on 06-28-2018 upon referral from Dr. Shelia Media.  Mr. Tonne, who is seen here accompanied by his wife.  The patient had been in and out of the hospital recently, he suffers also from chronic myelogenous leukemia, currently in remission.  His oncologist is Dr. Vevelyn Pat.  He had multiple back and neck surgeries and carries also a diagnosis of hypertension, diabetes and hypercholesterolemia.  He presents here for sleepiness and snoring which has concerned his wife.  She has noted an unusual breathing pattern mostly a crescendo snoring that then is followed by an interrupted breathing pattern.   The patient feels that his sleep quality is not unusually poor, he just reports that he wakes up and usually early at about 5 AM and wanted.  Usually he is woken by the urge to urinate and then cannot go back to sleep.   He endorsed the Epworth sleepiness score at 8 points and the fatigue severity score at 19 points, the geriatric depression score was endorsed at 3 out of 15 points which is not a high level.  Chief complaint according to patient :The patient feels that his sleep quality is not unusually poor, he just reports that he wakes up and usually early at about 5 AM and wanted.  Usually he is woken by the urge to urinate and then cannot go back to sleep.   Sleep habits are as follows: Dinner time between 5 and 6 PM , followed by bedtime at 11 PM. He is asleep in less than 30 minutes, immediately snoring. Sleeps on his sides ( back pain ), on one pillow. He wakes up  once for nocturia- at 5 AM. Seldomly dreams, he can;t recall, but his wife noted him talking. He sometimes moves in his dreams, has hallucinated while under medication influence- of gapapentin. He sweats excessively at night, notices that in AM when he goes to urinate.  Averages 6 hours of uninterrupted sleep with snoring and apnea not waking him.      Sleep medical history;Multivel fusion surgeries, back problems, weakness in legs ( see Dr. Krista Blue reported ) DM, cholesterol, HTN. Leukemia.   Family sleep history: several siblings living, no knowledge of snoring, OSA.    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  Re-Married, since 09-2017, 2 children from first marriage, one deceased - suicide.    REVIEW OF SYSTEMS: Out of a complete 14 system review of symptoms, the patient complains only of the following symptoms, and all other reviewed systems are negative.  ALLERGIES: No Known Allergies  HOME MEDICATIONS: Outpatient Medications Prior to Visit  Medication Sig Dispense Refill  . acetaminophen (TYLENOL) 500 MG tablet Take 1,000 mg by mouth every 6 (six) hours as needed (pain).     Marland Kitchen allopurinol (ZYLOPRIM) 300 MG tablet Take 300 mg by mouth daily.    Marland Kitchen aspirin EC 81 MG tablet Take 81 mg by mouth daily.    Marland Kitchen atenolol (TENORMIN) 25 MG tablet Take 25 mg by mouth daily.     Marland Kitchen  atorvastatin (LIPITOR) 40 MG tablet Take 40 mg by mouth daily.    Marland Kitchen CIALIS 20 MG tablet Take 20 mg by mouth as needed for erectile dysfunction.  6  . CINNAMON PO Take 1 tablet by mouth daily.    . diclofenac sodium (VOLTAREN) 1 % GEL Apply 1 application topically as needed (pain).     Marland Kitchen diphenhydrAMINE (SIMPLY SLEEP) 25 MG tablet Take 25 mg by mouth at bedtime as needed for sleep.    . famciclovir (FAMVIR) 500 MG tablet Take 1 tablet (500 mg total) by mouth daily. (Patient not taking: Reported on 11/29/2018) 30 tablet 8  . famotidine (PEPCID) 20 MG tablet Take 20 mg by mouth at bedtime as  needed for heartburn.     . hydrochlorothiazide (HYDRODIURIL) 25 MG tablet Take 25 mg by mouth daily.     Marland Kitchen HYDROcodone-acetaminophen (NORCO/VICODIN) 5-325 MG tablet Take 1-2 tablets by mouth every 6 (six) hours as needed for moderate pain. (Patient not taking: Reported on 12/14/2018) 30 tablet 0  . lidocaine-prilocaine (EMLA) cream Apply to affected area once (Patient taking differently: Apply 1 application topically daily as needed (pain). Apply to affected area once) 30 g 3  . lisinopril (ZESTRIL) 40 MG tablet Take 1 tablet (40 mg total) by mouth daily for 30 days. 30 tablet 0  . loratadine (CLARITIN) 10 MG tablet Take 10 mg by mouth daily.    Marland Kitchen LORazepam (ATIVAN) 0.5 MG tablet Take 1 tablet (0.5 mg total) by mouth every 6 (six) hours as needed (Nausea or vomiting). 30 tablet 0  . meloxicam (MOBIC) 15 MG tablet Take 15 mg by mouth daily as needed for pain.     . metFORMIN (GLUCOPHAGE-XR) 500 MG 24 hr tablet Take 500 mg by mouth every evening.     . NON FORMULARY Take 2 tablets by mouth daily. Blood Sugar Defense     . ondansetron (ZOFRAN) 8 MG tablet Take 1 tablet (8 mg total) by mouth 2 (two) times daily as needed for refractory nausea / vomiting. Start on day 3 after cyclophosphamide chemotherapy. 30 tablet 1  . oxyCODONE (ROXICODONE) 5 MG immediate release tablet Take 1 tablet (5 mg total) by mouth every 4 (four) hours as needed for severe pain. 8 tablet 0  . potassium chloride (K-DUR) 10 MEQ tablet Take 10 mEq by mouth daily.     . predniSONE (DELTASONE) 20 MG tablet Take 3 tablets (60 mg total) by mouth daily. Take on days 1-5 of chemotherapy. 15 tablet 6  . prochlorperazine (COMPAZINE) 10 MG tablet Take 1 tablet (10 mg total) by mouth every 6 (six) hours as needed (Nausea or vomiting). 30 tablet 6  . tamsulosin (FLOMAX) 0.4 MG CAPS capsule Take 0.4 mg by mouth daily.    Marland Kitchen tiZANidine (ZANAFLEX) 4 MG tablet Take 1 tablet (4 mg total) by mouth every 12 (twelve) hours as needed for muscle spasms.  30 tablet 0  . traMADol (ULTRAM) 50 MG tablet Take 1 tablet (50 mg total) by mouth every 6 (six) hours as needed. (Patient taking differently: Take 50 mg by mouth every 6 (six) hours as needed for moderate pain. ) 60 tablet 0   No facility-administered medications prior to visit.     PAST MEDICAL HISTORY: Past Medical History:  Diagnosis Date  . Cancer ([place])    Leukemia   . CLL (chronic lymphocytic leukemia) ([place])    see records in care everywhere from [place]  . Coronary artery disease    minimal nonobstructive  by 10/31/16 cath at [facility] in [place]  . Diabetes mellitus without complication ([place])   . Diffuse large B-cell lymphoma of lymph nodes of multiple regions ([place]) 10/31/2018  . GERD (gastroesophageal reflux disease)   . Goals of care, counseling/discussion 10/31/2018  . Hypertension   . Sleep apnea    does not wear CPAP  . Wears dentures   . Wears glasses     PAST SURGICAL HISTORY: Past Surgical History:  Procedure Laterality Date  . ANTERIOR CERVICAL DECOMP/DISCECTOMY FUSION N/A 04/20/2017   Procedure: ANTERIOR CERVICAL DECOMPRESSION FUSION, CERVICAL 3-4, CERVICAL 4-5 WITH INSTRUMENTATION AND ALLOGRAFT; REQUEST 3.5 HOURS;  Surgeon: Phylliss Bob, MD;  Location: [place];  Service: Orthopedics;  Laterality: N/A;  ANTERIOR CERVICAL DECOMPRESSION FUSION, CERVICAL 3-4, CERVICAL 4-5 WITH INSTRUMENTATION AND ALLOGRAFT; REQUEST 3.5 HOURS  . BACK SURGERY    . CARDIAC CATHETERIZATION     in Care Everywhere 11/01/16  . COLONOSCOPY    . IR IMAGING GUIDED PORT INSERTION  11/10/2018  . MULTIPLE TOOTH EXTRACTIONS    . ORIF ANKLE FRACTURE Left 09/13/2018   Procedure: OPEN REDUCTION INTERNAL FIXATION (ORIF) LATERAL LEFT ANKLE FRACTURE;  Surgeon: Dorna Leitz, MD;  Location: [place];  Service: Orthopedics;  Laterality: Left;  . SYNDESMOSIS REPAIR Left 09/13/2018   Procedure: OPEN REDUCTION INTERNAL FIXATION SYNDESMOSIS REPAIR LEFT ANKLE;  Surgeon: Dorna Leitz, MD;  Location: [place];  Service:  Orthopedics;  Laterality: Left;    FAMILY HISTORY: Family History  Problem Relation Age of Onset  . Heart attack Mother   . Diabetes Sister   . Diabetes Brother     SOCIAL HISTORY: Social History   Socioeconomic History  . Marital status: Single    Spouse name: Not on file  . Number of children: 1  . Years of education: 64  . Highest education level: Not on file  Occupational History  . Occupation: Retired  Scientific laboratory technician  . Financial resource strain: Not on file  . Food insecurity    Worry: Not on file    Inability: Not on file  . Transportation needs    Medical: Not on file    Non-medical: Not on file  Tobacco Use  . Smoking status: Former Smoker    Types: Pipe, Cigars, Cigarettes    Quit date: 10/31/1989    Years since quitting: 29.1  . Smokeless tobacco: Never Used  Substance and Sexual Activity  . Alcohol use: Yes    Frequency: Never    Comment: occasional  . Drug use: No  . Sexual activity: Not on file  Lifestyle  . Physical activity    Days per week: Not on file    Minutes per session: Not on file  . Stress: Not on file  Relationships  . Social Herbalist on phone: Not on file    Gets together: Not on file    Attends religious service: Not on file    Active member of club or organization: Not on file    Attends meetings of clubs or organizations: Not on file    Relationship status: Not on file  . Intimate partner violence    Fear of current or ex partner: Not on file    Emotionally abused: Not on file    Physically abused: Not on file    Forced sexual activity: Not on file  Other Topics Concern  . Not on file  Social History Narrative   Lives with wife   Caffeine use: Drinks coffee daily (2  cups)   Right handed       PHYSICAL EXAM  There were no vitals filed for this visit. There is no height or weight on file to calculate BMI.  Generalized: Well developed, in no acute distress  Cardiology: normal rate and rhythm, no murmur  noted Neurological examination  Mentation: Alert oriented to time, place, history taking. Follows all commands speech and language fluent Cranial nerve II-XII: Pupils were equal round reactive to light. Extraocular movements were full, visual field were full on confrontational test. Facial sensation and strength were normal. Uvula tongue midline. Head turning and shoulder shrug  were normal and symmetric. Motor: The motor testing reveals 5 over 5 strength of all 4 extremities. Good symmetric motor tone is noted throughout.  Sensory: Sensory testing is intact to soft touch on all 4 extremities. No evidence of extinction is noted.  Coordination: Cerebellar testing reveals good finger-nose-finger and heel-to-shin bilaterally.  Gait and station: Gait is normal. Tandem gait is normal. Romberg is negative. No drift is seen.  Reflexes: Deep tendon reflexes are symmetric and normal bilaterally.   DIAGNOSTIC DATA (LABS, IMAGING, TESTING) - I reviewed patient records, labs, notes, testing and imaging myself where available.  No flowsheet data found.   Lab Results  Component Value Date   WBC 5.6 11/29/2018   HGB 12.5 (L) 11/29/2018   HCT 35.4 (L) 11/29/2018   MCV 87.4 11/29/2018   PLT 178 11/29/2018      Component Value Date/Time   NA 139 11/29/2018 0835   NA 142 01/25/2017 1217   K 3.8 11/29/2018 0835   K 4.1 01/25/2017 1217   CL 105 11/29/2018 0835   CL 102 01/25/2017 1217   CO2 26 11/29/2018 0835   CO2 33 01/25/2017 1217   GLUCOSE 260 (H) 11/29/2018 0835   GLUCOSE 128 (H) 01/25/2017 1217   BUN 11 11/29/2018 0835   BUN 12 01/25/2017 1217   CREATININE 1.04 11/29/2018 0835   CREATININE 1.4 (H) 01/25/2017 1217   CALCIUM 8.0 (L) 11/29/2018 0835   CALCIUM 9.7 01/25/2017 1217   PROT 5.8 (L) 11/29/2018 0835   PROT 7.2 01/25/2017 1217   ALBUMIN 3.8 11/29/2018 0835   ALBUMIN 4.2 01/25/2017 1217   AST 22 11/29/2018 0835   ALT 25 11/29/2018 0835   ALT 32 01/25/2017 1217   ALKPHOS 53  11/29/2018 0835   ALKPHOS 59 01/25/2017 1217   BILITOT 0.5 11/29/2018 0835   GFRNONAA >60 11/29/2018 0835   GFRAA >60 11/29/2018 0835   Lab Results  Component Value Date   CHOL 90 11/23/2017   HDL 27 (L) 11/23/2017   LDLCALC 49 11/23/2017   TRIG 72 11/23/2017   CHOLHDL 3.3 11/23/2017   Lab Results  Component Value Date   HGBA1C 6.7 (H) 11/23/2017   No results found for: [id number] Lab Results  Component Value Date   TSH 0.652 02/16/2018       ASSESSMENT AND PLAN 73 y.o. year old male  has a past medical history of Cancer ([place]), CLL (chronic lymphocytic leukemia) ([place]), Coronary artery disease, Diabetes mellitus without complication ([place]), Diffuse large B-cell lymphoma of lymph nodes of multiple regions ([place]) (10/31/2018), GERD (gastroesophageal reflux disease), Goals of care, counseling/discussion (10/31/2018), Hypertension, Sleep apnea, Wears dentures, and Wears glasses. here with ***    ICD-10-CM   1. Complex sleep apnea syndrome  G47.31        No orders of the defined types were placed in this encounter.    No orders of the  defined types were placed in this encounter.     I spent 15 minutes with the patient. 50% of this time was spent counseling and educating patient on plan of care and medications.    Debbora Presto, FNP-C 12/18/2018, 8:52 AM [facility] [address] [place], [place] [address] [phone]

## 2018-12-19 ENCOUNTER — Ambulatory Visit (HOSPITAL_COMMUNITY)
Admission: RE | Admit: 2018-12-19 | Discharge: 2018-12-19 | Disposition: A | Discharge status: Home / Self Care | Payer: Medicare HMO | Source: Home / Self Care | Attending: Orthopaedic Surgery | Admitting: Orthopaedic Surgery

## 2018-12-19 ENCOUNTER — Inpatient Hospital Stay (HOSPITAL_COMMUNITY): Payer: Medicare HMO | Admitting: Vascular Surgery

## 2018-12-19 ENCOUNTER — Encounter (HOSPITAL_COMMUNITY): Payer: Medicare HMO

## 2018-12-19 ENCOUNTER — Encounter (HOSPITAL_COMMUNITY)
Admission: RE | Disposition: A | Discharge status: Home / Self Care | Payer: Self-pay | Source: Home / Self Care | Attending: Orthopaedic Surgery

## 2018-12-19 ENCOUNTER — Other Ambulatory Visit: Payer: Self-pay

## 2018-12-19 ENCOUNTER — Ambulatory Visit (HOSPITAL_COMMUNITY): Payer: Medicare HMO

## 2018-12-19 ENCOUNTER — Encounter (HOSPITAL_COMMUNITY): Payer: Self-pay

## 2018-12-19 DIAGNOSIS — T85698A Other mechanical complication of other specified internal prosthetic devices, implants and grafts, initial encounter: Secondary | ICD-10-CM | POA: Diagnosis not present

## 2018-12-19 DIAGNOSIS — E1140 Type 2 diabetes mellitus with diabetic neuropathy, unspecified: Secondary | ICD-10-CM | POA: Diagnosis not present

## 2018-12-19 DIAGNOSIS — I2510 Atherosclerotic heart disease of native coronary artery without angina pectoris: Secondary | ICD-10-CM | POA: Diagnosis not present

## 2018-12-19 DIAGNOSIS — C9110 Chronic lymphocytic leukemia of B-cell type not having achieved remission: Secondary | ICD-10-CM | POA: Diagnosis not present

## 2018-12-19 DIAGNOSIS — M96672 Fracture of tibia or fibula following insertion of orthopedic implant, joint prosthesis, or bone plate, left leg: Secondary | ICD-10-CM | POA: Insufficient documentation

## 2018-12-19 DIAGNOSIS — E872 Acidosis: Secondary | ICD-10-CM | POA: Diagnosis not present

## 2018-12-19 DIAGNOSIS — R509 Fever, unspecified: Secondary | ICD-10-CM | POA: Diagnosis not present

## 2018-12-19 DIAGNOSIS — G8918 Other acute postprocedural pain: Secondary | ICD-10-CM | POA: Diagnosis not present

## 2018-12-19 DIAGNOSIS — X58XXXA Exposure to other specified factors, initial encounter: Secondary | ICD-10-CM | POA: Insufficient documentation

## 2018-12-19 DIAGNOSIS — S82832G Other fracture of upper and lower end of left fibula, subsequent encounter for closed fracture with delayed healing: Secondary | ICD-10-CM | POA: Diagnosis not present

## 2018-12-19 DIAGNOSIS — Z20828 Contact with and (suspected) exposure to other viral communicable diseases: Secondary | ICD-10-CM | POA: Insufficient documentation

## 2018-12-19 DIAGNOSIS — R5082 Postprocedural fever: Secondary | ICD-10-CM | POA: Diagnosis not present

## 2018-12-19 DIAGNOSIS — Z7984 Long term (current) use of oral hypoglycemic drugs: Secondary | ICD-10-CM | POA: Insufficient documentation

## 2018-12-19 DIAGNOSIS — I10 Essential (primary) hypertension: Secondary | ICD-10-CM | POA: Insufficient documentation

## 2018-12-19 DIAGNOSIS — R410 Disorientation, unspecified: Secondary | ICD-10-CM | POA: Diagnosis not present

## 2018-12-19 DIAGNOSIS — A419 Sepsis, unspecified organism: Secondary | ICD-10-CM | POA: Diagnosis not present

## 2018-12-19 DIAGNOSIS — Z9221 Personal history of antineoplastic chemotherapy: Secondary | ICD-10-CM | POA: Insufficient documentation

## 2018-12-19 DIAGNOSIS — Z9889 Other specified postprocedural states: Secondary | ICD-10-CM | POA: Diagnosis not present

## 2018-12-19 DIAGNOSIS — Z7982 Long term (current) use of aspirin: Secondary | ICD-10-CM | POA: Insufficient documentation

## 2018-12-19 DIAGNOSIS — Z79899 Other long term (current) drug therapy: Secondary | ICD-10-CM | POA: Insufficient documentation

## 2018-12-19 DIAGNOSIS — Z419 Encounter for procedure for purposes other than remedying health state, unspecified: Secondary | ICD-10-CM

## 2018-12-19 DIAGNOSIS — K219 Gastro-esophageal reflux disease without esophagitis: Secondary | ICD-10-CM | POA: Insufficient documentation

## 2018-12-19 DIAGNOSIS — S9302XA Subluxation of left ankle joint, initial encounter: Secondary | ICD-10-CM | POA: Diagnosis not present

## 2018-12-19 DIAGNOSIS — S8262XG Displaced fracture of lateral malleolus of left fibula, subsequent encounter for closed fracture with delayed healing: Secondary | ICD-10-CM | POA: Diagnosis not present

## 2018-12-19 DIAGNOSIS — E1165 Type 2 diabetes mellitus with hyperglycemia: Secondary | ICD-10-CM | POA: Diagnosis not present

## 2018-12-19 DIAGNOSIS — T84028A Dislocation of other internal joint prosthesis, initial encounter: Secondary | ICD-10-CM | POA: Insufficient documentation

## 2018-12-19 DIAGNOSIS — R6889 Other general symptoms and signs: Secondary | ICD-10-CM | POA: Diagnosis not present

## 2018-12-19 DIAGNOSIS — S93432A Sprain of tibiofibular ligament of left ankle, initial encounter: Secondary | ICD-10-CM | POA: Insufficient documentation

## 2018-12-19 DIAGNOSIS — G4730 Sleep apnea, unspecified: Secondary | ICD-10-CM | POA: Insufficient documentation

## 2018-12-19 DIAGNOSIS — T84197A Other mechanical complication of internal fixation device of bone of left lower leg, initial encounter: Secondary | ICD-10-CM | POA: Diagnosis not present

## 2018-12-19 DIAGNOSIS — Z472 Encounter for removal of internal fixation device: Secondary | ICD-10-CM | POA: Diagnosis not present

## 2018-12-19 DIAGNOSIS — E669 Obesity, unspecified: Secondary | ICD-10-CM | POA: Insufficient documentation

## 2018-12-19 DIAGNOSIS — Z6835 Body mass index (BMI) 35.0-35.9, adult: Secondary | ICD-10-CM | POA: Insufficient documentation

## 2018-12-19 DIAGNOSIS — R531 Weakness: Secondary | ICD-10-CM | POA: Diagnosis not present

## 2018-12-19 DIAGNOSIS — S93492D Sprain of other ligament of left ankle, subsequent encounter: Secondary | ICD-10-CM | POA: Diagnosis not present

## 2018-12-19 DIAGNOSIS — Z87891 Personal history of nicotine dependence: Secondary | ICD-10-CM | POA: Insufficient documentation

## 2018-12-19 DIAGNOSIS — C8338 Diffuse large B-cell lymphoma, lymph nodes of multiple sites: Secondary | ICD-10-CM | POA: Insufficient documentation

## 2018-12-19 DIAGNOSIS — S93422A Sprain of deltoid ligament of left ankle, initial encounter: Secondary | ICD-10-CM | POA: Insufficient documentation

## 2018-12-19 DIAGNOSIS — T84098A Other mechanical complication of other internal joint prosthesis, initial encounter: Secondary | ICD-10-CM | POA: Diagnosis not present

## 2018-12-19 DIAGNOSIS — E1142 Type 2 diabetes mellitus with diabetic polyneuropathy: Secondary | ICD-10-CM | POA: Insufficient documentation

## 2018-12-19 HISTORY — DX: Other injury of unspecified body region, initial encounter: T14.8XXA

## 2018-12-19 HISTORY — PX: ANKLE RECONSTRUCTION: SHX1151

## 2018-12-19 LAB — CBC
HCT: 34.7 % — ABNORMAL LOW (ref 39.0–52.0)
Hemoglobin: 12.4 g/dL — ABNORMAL LOW (ref 13.0–17.0)
MCH: 31 pg (ref 26.0–34.0)
MCHC: 35.7 g/dL (ref 30.0–36.0)
MCV: 86.8 fL (ref 80.0–100.0)
Platelets: 230 10*3/uL (ref 150–400)
RBC: 4 MIL/uL — ABNORMAL LOW (ref 4.22–5.81)
RDW: 13.5 % (ref 11.5–15.5)
WBC: 6.7 10*3/uL (ref 4.0–10.5)
nRBC: 0 % (ref 0.0–0.2)

## 2018-12-19 LAB — BASIC METABOLIC PANEL
Anion gap: 11 (ref 5–15)
BUN: 8 mg/dL (ref 8–23)
CO2: 27 mmol/L (ref 22–32)
Calcium: 9.2 mg/dL (ref 8.9–10.3)
Chloride: 100 mmol/L (ref 98–111)
Creatinine, Ser: 1.11 mg/dL (ref 0.61–1.24)
GFR calc Af Amer: >60 mL/min (ref >60)
GFR calc non Af Amer: >60 mL/min (ref >60)
Glucose, Bld: 173 mg/dL — ABNORMAL HIGH (ref 70–99)
Potassium: 4.4 mmol/L (ref 3.5–5.1)
Sodium: 138 mmol/L (ref 135–145)

## 2018-12-19 LAB — GLUCOSE, CAPILLARY
Glucose-Capillary: 166 mg/dL — ABNORMAL HIGH (ref 70–99)
Glucose-Capillary: 141 mg/dL — ABNORMAL HIGH (ref 70–99)

## 2018-12-19 LAB — HEMOGLOBIN A1C
Hgb A1c MFr Bld: 8 % — ABNORMAL HIGH (ref 4.8–5.6)
Mean Plasma Glucose: 182.9 mg/dL

## 2018-12-19 LAB — SARS CORONAVIRUS 2 BY RT PCR (HOSPITAL ORDER, PERFORMED IN ~~LOC~~ HOSPITAL LAB): SARS Coronavirus 2: NEGATIVE

## 2018-12-19 SURGERY — RECONSTRUCTION, ANKLE
Anesthesia: General | Site: Ankle | Laterality: Left

## 2018-12-19 MED ORDER — ONDANSETRON HCL 4 MG/2ML IJ SOLN
INTRAMUSCULAR | Status: DC | PRN
  Administered 2018-12-19: 4 mg via INTRAVENOUS

## 2018-12-19 MED ORDER — ONDANSETRON HCL 4 MG/2ML IJ SOLN
INTRAMUSCULAR | Status: AC
  Filled 2018-12-19: qty 2

## 2018-12-19 MED ORDER — ROPIVACAINE HCL 7.5 MG/ML IJ SOLN
INTRAMUSCULAR | Status: DC | PRN
  Administered 2018-12-19: 20 mL via PERINEURAL

## 2018-12-19 MED ORDER — SUCCINYLCHOLINE CHLORIDE 20 MG/ML IJ SOLN
INTRAMUSCULAR | Status: DC | PRN
  Administered 2018-12-19: 140 mg via INTRAVENOUS

## 2018-12-19 MED ORDER — PHENYLEPHRINE 40 MCG/ML (10ML) SYRINGE FOR IV PUSH (FOR BLOOD PRESSURE SUPPORT)
PREFILLED_SYRINGE | INTRAVENOUS | Status: AC
  Filled 2018-12-19: qty 10

## 2018-12-19 MED ORDER — EPHEDRINE SULFATE 50 MG/ML IJ SOLN
INTRAMUSCULAR | Status: DC | PRN
  Administered 2018-12-19: 5 mg via INTRAVENOUS

## 2018-12-19 MED ORDER — BUPIVACAINE-EPINEPHRINE (PF) 0.5% -1:200000 IJ SOLN
INTRAMUSCULAR | Status: DC | PRN
  Administered 2018-12-19: 30 mL via PERINEURAL

## 2018-12-19 MED ORDER — MIDAZOLAM HCL 2 MG/2ML IJ SOLN
INTRAMUSCULAR | Status: AC
  Administered 2018-12-19: 1 mg via INTRAVENOUS
  Filled 2018-12-19: qty 2

## 2018-12-19 MED ORDER — FENTANYL CITRATE (PF) 100 MCG/2ML IJ SOLN
INTRAMUSCULAR | Status: AC
  Administered 2018-12-19: 100 ug via INTRAVENOUS
  Filled 2018-12-19: qty 2

## 2018-12-19 MED ORDER — SODIUM CHLORIDE 0.9 % IV SOLN
INTRAVENOUS | Status: DC | PRN
  Administered 2018-12-19: 50 ug/min via INTRAVENOUS

## 2018-12-19 MED ORDER — PROPOFOL 10 MG/ML IV BOLUS
INTRAVENOUS | Status: DC | PRN
  Administered 2018-12-19: 150 mg via INTRAVENOUS

## 2018-12-19 MED ORDER — MIDAZOLAM HCL 2 MG/2ML IJ SOLN: 0.5000 mg | Freq: Once | INTRAMUSCULAR | Status: DC | PRN

## 2018-12-19 MED ORDER — MIDAZOLAM HCL 2 MG/2ML IJ SOLN
1.0000 mg | Freq: Once | INTRAMUSCULAR | Status: AC
  Administered 2018-12-19: 1 mg via INTRAVENOUS

## 2018-12-19 MED ORDER — PROMETHAZINE HCL 25 MG/ML IJ SOLN: 6.2500 mg | INTRAMUSCULAR | Status: DC | PRN

## 2018-12-19 MED ORDER — PHENYLEPHRINE 40 MCG/ML (10ML) SYRINGE FOR IV PUSH (FOR BLOOD PRESSURE SUPPORT)
PREFILLED_SYRINGE | INTRAVENOUS | Status: DC | PRN
  Administered 2018-12-19 (×2): 120 ug via INTRAVENOUS
  Administered 2018-12-19 (×2): 80 ug via INTRAVENOUS

## 2018-12-19 MED ORDER — FENTANYL CITRATE (PF) 100 MCG/2ML IJ SOLN
100.0000 ug | Freq: Once | INTRAMUSCULAR | Status: AC
  Administered 2018-12-19: 11:00:00 100 ug via INTRAVENOUS

## 2018-12-19 MED ORDER — 0.9 % SODIUM CHLORIDE (POUR BTL) OPTIME
TOPICAL | Status: DC | PRN
  Administered 2018-12-19: 1000 mL

## 2018-12-19 MED ORDER — MEPERIDINE HCL 25 MG/ML IJ SOLN: 6.2500 mg | INTRAMUSCULAR | Status: DC | PRN

## 2018-12-19 MED ORDER — EPHEDRINE 5 MG/ML INJ
INTRAVENOUS | Status: AC
  Filled 2018-12-19: qty 10

## 2018-12-19 MED ORDER — ASPIRIN 325 MG PO TABS: 325.0000 mg | ORAL_TABLET | Freq: Every day | ORAL | 11 refills | Status: DC

## 2018-12-19 MED ORDER — FENTANYL CITRATE (PF) 250 MCG/5ML IJ SOLN
INTRAMUSCULAR | Status: DC | PRN
  Administered 2018-12-19 (×5): 50 ug via INTRAVENOUS

## 2018-12-19 MED ORDER — HYDROMORPHONE HCL 1 MG/ML IJ SOLN: 0.2500 mg | INTRAMUSCULAR | Status: DC | PRN

## 2018-12-19 MED ORDER — LIDOCAINE 2% (20 MG/ML) 5 ML SYRINGE
INTRAMUSCULAR | Status: DC | PRN
  Administered 2018-12-19: 60 mg via INTRAVENOUS

## 2018-12-19 MED ORDER — FENTANYL CITRATE (PF) 250 MCG/5ML IJ SOLN
INTRAMUSCULAR | Status: AC
  Filled 2018-12-19: qty 5

## 2018-12-19 MED ORDER — LACTATED RINGERS IV SOLN
INTRAVENOUS | Status: DC
  Administered 2018-12-19: 10:00:00 via INTRAVENOUS

## 2018-12-19 MED ORDER — LIDOCAINE 2% (20 MG/ML) 5 ML SYRINGE
INTRAMUSCULAR | Status: AC
  Filled 2018-12-19: qty 5

## 2018-12-19 MED ORDER — INSULIN ASPART 100 UNIT/ML ~~LOC~~ SOLN: 0.0000 [IU] | Freq: Three times a day (TID) | SUBCUTANEOUS | Status: DC

## 2018-12-19 MED ORDER — CHLORHEXIDINE GLUCONATE 4 % EX LIQD: 60.0000 mL | Freq: Once | CUTANEOUS | Status: DC

## 2018-12-19 MED ORDER — SUCCINYLCHOLINE CHLORIDE 200 MG/10ML IV SOSY
PREFILLED_SYRINGE | INTRAVENOUS | Status: AC
  Filled 2018-12-19: qty 10

## 2018-12-19 SURGICAL SUPPLY — 66 items
ALCOHOL 70% 16 OZ (MISCELLANEOUS) ×2 IMPLANT
BANDAGE ESMARK 6X9 LF (GAUZE/BANDAGES/DRESSINGS) IMPLANT
BIT DRILL 2.5 CANN LNG (BIT) ×1 IMPLANT
BIT DRILL 2.5 CANN STRL (BIT) ×1 IMPLANT
BLADE SURG 15 STRL LF DISP TIS (BLADE) ×1 IMPLANT
BLADE SURG 15 STRL SS (BLADE) ×1
BNDG COHESIVE 4X5 TAN STRL (GAUZE/BANDAGES/DRESSINGS) IMPLANT
BNDG COHESIVE 6X5 TAN STRL LF (GAUZE/BANDAGES/DRESSINGS) ×1 IMPLANT
BNDG ELASTIC 6X10 VLCR STRL LF (GAUZE/BANDAGES/DRESSINGS) ×2 IMPLANT
BNDG ESMARK 6X9 LF (GAUZE/BANDAGES/DRESSINGS)
CANISTER SUCT 3000ML PPV (MISCELLANEOUS) ×2 IMPLANT
CHLORAPREP W/TINT 26 (MISCELLANEOUS) ×4 IMPLANT
COVER SURGICAL LIGHT HANDLE (MISCELLANEOUS) ×2 IMPLANT
COVER WAND RF STERILE (DRAPES) ×2 IMPLANT
CUFF TOURN SGL QUICK 34 (TOURNIQUET CUFF) ×1
CUFF TOURN SGL QUICK 42 (TOURNIQUET CUFF) IMPLANT
CUFF TRNQT CYL 34X4.125X (TOURNIQUET CUFF) ×1 IMPLANT
DRAPE OEC MINIVIEW 54X84 (DRAPES) ×2 IMPLANT
DRAPE U-SHAPE 47X51 STRL (DRAPES) ×2 IMPLANT
DRSG ADAPTIC 3X8 NADH LF (GAUZE/BANDAGES/DRESSINGS) ×1 IMPLANT
DRSG MEPITEL 4X7.2 (GAUZE/BANDAGES/DRESSINGS) ×2 IMPLANT
DRSG PAD ABDOMINAL 8X10 ST (GAUZE/BANDAGES/DRESSINGS) ×4 IMPLANT
DRSG XEROFORM 1X8 (GAUZE/BANDAGES/DRESSINGS) ×2 IMPLANT
ELECT REM PT RETURN 9FT ADLT (ELECTROSURGICAL) ×2
ELECTRODE REM PT RTRN 9FT ADLT (ELECTROSURGICAL) ×1 IMPLANT
GAUZE SPONGE 4X4 12PLY STRL (GAUZE/BANDAGES/DRESSINGS) IMPLANT
GAUZE SPONGE 4X4 12PLY STRL LF (GAUZE/BANDAGES/DRESSINGS) ×2 IMPLANT
GLOVE BIOGEL M STRL SZ7.5 (GLOVE) ×2 IMPLANT
GLOVE BIOGEL PI IND STRL 8 (GLOVE) ×1 IMPLANT
GLOVE BIOGEL PI INDICATOR 8 (GLOVE) ×1
GOWN STRL REUS W/ TWL LRG LVL3 (GOWN DISPOSABLE) ×1 IMPLANT
GOWN STRL REUS W/ TWL XL LVL3 (GOWN DISPOSABLE) ×1 IMPLANT
GOWN STRL REUS W/TWL LRG LVL3 (GOWN DISPOSABLE) ×1
GOWN STRL REUS W/TWL XL LVL3 (GOWN DISPOSABLE) ×1
GUIDEWIRE 1.6 (WIRE) ×1
GUIDEWIRE ORTH 157X1.6XTROC (WIRE) IMPLANT
KIT BASIN OR (CUSTOM PROCEDURE TRAY) ×2 IMPLANT
KIT TURNOVER KIT B (KITS) ×2 IMPLANT
NS IRRIG 1000ML POUR BTL (IV SOLUTION) ×2 IMPLANT
PACK ORTHO EXTREMITY (CUSTOM PROCEDURE TRAY) ×2 IMPLANT
PAD ABD 8X10 STRL (GAUZE/BANDAGES/DRESSINGS) ×1 IMPLANT
PAD ARMBOARD 7.5X6 YLW CONV (MISCELLANEOUS) ×4 IMPLANT
PAD CAST 4YDX4 CTTN HI CHSV (CAST SUPPLIES) ×1 IMPLANT
PADDING CAST COTTON 4X4 STRL (CAST SUPPLIES) ×1
PADDING CAST SYNTHETIC 4 (CAST SUPPLIES) ×2
PADDING CAST SYNTHETIC 4X4 STR (CAST SUPPLIES) IMPLANT
PLATE LOCKING STRAIGHT 10H (Plate) ×1 IMPLANT
SCREW CORT NL FMS 3.5X45 (Screw) ×1 IMPLANT
SCREW LO PRF TMSS 3.5X55 CORT (Screw) ×1 IMPLANT
SCREW LOW PROFILE 3.5X16 (Screw) ×2 IMPLANT
SCREW NLOCK T15 FT 18X3.5XST (Screw) IMPLANT
SCREW NON LOCK 3.5X18MM (Screw) ×3 IMPLANT
SCREW NON-LOCKING 3.5X12MM (Screw) ×1 IMPLANT
SCREW NON-LOCKING 3.5X20 ANKLE (Screw) ×1 IMPLANT
SPONGE LAP 18X18 RF (DISPOSABLE) ×2 IMPLANT
SUCTION FRAZIER HANDLE 10FR (MISCELLANEOUS) ×1
SUCTION TUBE FRAZIER 10FR DISP (MISCELLANEOUS) ×1 IMPLANT
SUT ETHILON 2 0 FS 18 (SUTURE) ×2 IMPLANT
SUT ETHILON 3 0 PS 1 (SUTURE) ×2 IMPLANT
SUT MNCRL AB 3-0 PS2 18 (SUTURE) ×3 IMPLANT
SUT PDS AB 2-0 CT1 27 (SUTURE) ×2 IMPLANT
SUT VIC AB 2-0 CT1 27 (SUTURE) ×2
SUT VIC AB 2-0 CT1 TAPERPNT 27 (SUTURE) ×2 IMPLANT
TOWEL GREEN STERILE (TOWEL DISPOSABLE) ×2 IMPLANT
TOWEL GREEN STERILE FF (TOWEL DISPOSABLE) ×2 IMPLANT
TUBE CONNECTING 12X1/4 (SUCTIONS) ×2 IMPLANT

## 2018-12-19 NOTE — Op Note (Signed)
Ronald Rice male 73 y.o. 12/19/2018  PreOperative Diagnosis: Left Weber C distal fibula fracture with failure of prior fixation Syndesmotic disruption Failed orthopedic hardware, retained Deltoid ligament tear  PostOperative Diagnosis: Same  PROCEDURE: Revision open reduction internal fixation left fibula Open reduction internal fixation left syndesmosis Removal of deep hardware  SURGEON: Melony Overly, MD  ASSISTANT: RNFA  ANESTHESIA: General endotracheal tube with peripheral nerve block  FINDINGS: Failure of orthopedic hardware about the fibula with subluxated ankle joint and syndesmotic ligament disruption Minimal bony consolidation at the fracture site previously fixed  IMPLANTS: Arthrex 3.5 mm recon plate  ZCHYIFOYDXA:73 y.o. male underwent ORIF of a Weber C distal fibula fracture with syndesmotic disruption on September 13, 2018 by outside provider.  He has known uncontrolled diabetes with hemoglobin A1c of 8 and dense peripheral neuropathy.  He was sent to me for revision fixation and treatment.  X-rays in the office demonstrated subluxation of the ankle posteriorly with appropriate bottom deformity about the distal fibula fracture and hardware failure with bent plate.  Given the subluxation of the ankle joint and hardware failure he was indicated for revision open treatment of his ankle and syndesmosis.  Also required hardware removal.  We discussed the risk, benefits alternatives of surgery which include but not limited to wound healing complications, infection, nonunion, malunion, need for further surgery, demonstrating structures and need for further surgery.  He we also discussed the increased risk of the complications given his history of uncontrolled diabetes.  He also a history of CLL and is undergoing treatment.  Patient takes 181 mg aspirin daily.  After weighing the above risks and benefits the patient opted to proceed with surgery.  PROCEDURE: Patient was  identified in the preoperative holding area.  The left leg was marked myself.  The consent was signed myself and the patient.  Peripheral nerve blockade was performed by anesthesia.  She was taken the operative suite placed supine operative table.  Thigh tourniquet was placed on the left thigh after ETT anesthesia was induced without difficulty.  Preoperative antibiotics were given.  A bump was placed on the left hip and all bony prominences were well-padded.  The left lower extremity was prepped and draped in the usual sterile fashion.  Surgical timeout was performed we began by elevating the limb and inflating the tourniquet to 300 mmHg.    We began by making a longitudinal incision overlying the previous incision about the distal fibula.  This taken sharply down through skin and subcutaneous tissue.  Blunt dissection was was used to attempt to identify branches of the superficial peroneal nerve which were not identified.  The incision was then taken sharply down to the distal fibula and hardware.  Periosteal elevator was used to remove the scar from the hardware anteriorly and posteriorly.  Then a screwdriver was used to remove the screws and the plate was removed without difficulty.  There was significant bending of the plate.  The screws appear to have good purchase.  Then the fracture site was identified and the previously placed lag screw was removed.  There was some comminution and refracturing of the fracture site due to the lag screw and the failure of fixation recently.  Then the fracture site was mobilized and using a curette and a rondure some of the fracture hematoma was removed.  There was not much evidence of bony consolidation at the fracture site.  Using a periosteal elevator distally the distal fragment was mobilized better and we were able to manually  reduce the fracture using a lobster claw.  Then a 10 hole recon plate was placed about the fracture and held in place provisionally.  Then  fluoroscopy confirmed appropriate reduction of the fibula and closing down of the medial clear space and syndesmosis.  The mortise was well reduced.  Then the plate was placed for definitive fixation.  A single screw was placed distally and then using a Weber clamp the syndesmosis was reduced under direct visualization and clamped.  Then 2 syndesmotic screws were placed across the syndesmosis providing good fixation.  The ankle was then stressed under fluoroscopy and manually and found to be stable.  Final fluoroscopic images were obtained.  We then irrigated the wounds copiously and the deep tissue was closed over the plate using a 2-0 PDS suture.  The tourniquet was released.  The subcuticular tissue was closed with a 3-0 Monocryl and the skin with 2-0 nylon.  Counts were correct at the end the case.  A soft dressing of Adaptic, 4 x 4's and sterile she cotton was placed.  He was placed in a short leg nonweightbearing splint that was well molded and well-padded.  He was awakened from anesthesia and taken recovery in stable condition.  POST OPERATIVE INSTRUCTIONS: Nonweightbearing to left lower extremity Keep limb elevated Call the office with concerns He will follow-up in 2 weeks for splint removal and x-rays.  We will likely leave sutures in a total of 4 weeks given his history of diabetes. He will take 1 325 mg aspirin daily for DVT prophylaxis   TOURNIQUET TIME: 79 minutes  BLOOD LOSS:  Minimal         DRAINS: none         SPECIMEN: none       COMPLICATIONS:  * No complications entered in OR log *         Disposition: PACU - hemodynamically stable.         Condition: stable

## 2018-12-19 NOTE — Anesthesia Procedure Notes (Signed)
Anesthesia Regional Block: Popliteal block   Pre-Anesthetic Checklist: ,, timeout performed, Correct Patient, Correct Site, Correct Laterality, Correct Procedure, Correct Position, site marked, Risks and benefits discussed,  Surgical consent,  Pre-op evaluation,  At surgeon's request and post-op pain management  Laterality: Left and Lower  Prep: chloraprep       Needles:  Injection technique: Single-shot  Needle Type: Echogenic Stimulator Needle     Needle Length: 9cm  Needle Gauge: 21     Additional Needles:   Procedures:, nerve stimulator,,, ultrasound used (permanent image in chart),,,,   Nerve Stimulator or Paresthesia:  Response: toe twitches, 0.4 mA, 0.1 ms,   Additional Responses:   Narrative:  Start time: 12/19/2018 11:03 AM End time: 12/19/2018 11:09 AM Injection made incrementally with aspirations every 5 mL.  Performed by: Personally  Anesthesiologist: Annye Asa, MD  Additional Notes: Pt identified in Holding room.  Monitors applied. Working IV access confirmed. Sterile prep L lateral knee.  #21ga ECHOgenic PNS to toe twitches at 0.10mA threshold with US guidance.  30cc 0.5% Bupivacaine with 1:200k epi injected incrementally after negative test dose.  Patient asymptomatic, VSS, no heme aspirated, tolerated well.  Jenita Seashore, MD

## 2018-12-19 NOTE — Anesthesia Postprocedure Evaluation (Signed)
Anesthesia Post Note  Patient: Aneudy Champlain  Procedure(s) Performed: LEFT ANKLE REVISION FIXATION, REMOVAL OF HARDWARE, OPEN TREATMENT, SYNDESMOSIS, POSSIBLE ANKLE ARTHROTOMY, DELTOID LIGAMENT REPAIR (Left Ankle)     Patient location during evaluation: PACU Anesthesia Type: General and Regional Level of consciousness: awake and alert, oriented and patient cooperative Pain management: pain level controlled Vital Signs Assessment: post-procedure vital signs reviewed and stable Respiratory status: spontaneous breathing, nonlabored ventilation, respiratory function stable and patient connected to nasal cannula oxygen Cardiovascular status: blood pressure returned to baseline and stable Postop Assessment: no apparent nausea or vomiting Anesthetic complications: no    Last Vitals:  Vitals:   12/19/18 1411 12/19/18 1416  BP:  120/70  Pulse: 100 99  Resp: 11 11  Temp:  36.7 C  SpO2: 96% 95%    Last Pain:  Vitals:   12/19/18 1416  TempSrc:   PainSc: 0-No pain                 Taquanna Borras,E. Olegario Emberson

## 2018-12-19 NOTE — H&P (Signed)
Ronald Rice is an 73 y.o. male.   Chief Complaint: Status post ORIF left ankle and syndesmosis on 09/13/2018 by Dr. Dorna Leitz with failure of fixation. HPI: Ronald Rice is here today for revision ORIF of his ankle and syndesmosis.  Patient initially had surgery on 09/13/2018 and was having an uneventful recovery.  He stepped down out of bed and felt his foot flops out from under him.  He was seen in the emergency department where there was evidence of hardware failure and deltoid ligament avulsion.  There is medial clear space widening.  Patient was indicated for revision fixation given the subluxation of the ankle joint and medial clear space widening.  Patient does have a history of diabetes and does not take medications for this.  A1c today in the preoperative holding area is 8.  Patient denies significant amount of pain as he has numbness in bilateral lower extremities from diabetic neuropathy.  He has been trying to maintain nonweightbearing which is been difficult.  He lives with his wife at home.  Past Medical History:  Diagnosis Date  . Cancer ([place])    Leukemia   . CLL (chronic lymphocytic leukemia) ([place])    see records in care everywhere from [place]  . Coronary artery disease    minimal nonobstructive by 10/31/16 cath at [facility] in [place]  . Diabetes mellitus without complication ([place])   . Diffuse large B-cell lymphoma of lymph nodes of multiple regions ([place]) 10/31/2018  . Fracture    left ankle fracture with retained hardware  . GERD (gastroesophageal reflux disease)   . Goals of care, counseling/discussion 10/31/2018  . Hypertension   . Sleep apnea    does not wear CPAP  . Wears dentures   . Wears glasses     Past Surgical History:  Procedure Laterality Date  . ANTERIOR CERVICAL DECOMP/DISCECTOMY FUSION N/A 04/20/2017   Procedure: ANTERIOR CERVICAL DECOMPRESSION FUSION, CERVICAL 3-4, CERVICAL 4-5 WITH INSTRUMENTATION AND ALLOGRAFT; REQUEST 3.5 HOURS;  Surgeon: Phylliss Bob, MD;   Location: [facility];  Service: Orthopedics;  Laterality: N/A;  ANTERIOR CERVICAL DECOMPRESSION FUSION, CERVICAL 3-4, CERVICAL 4-5 WITH INSTRUMENTATION AND ALLOGRAFT; REQUEST 3.5 HOURS  . BACK SURGERY    . CARDIAC CATHETERIZATION     in Care Everywhere 11/01/16  . COLONOSCOPY    . IR IMAGING GUIDED PORT INSERTION  11/10/2018  . MULTIPLE TOOTH EXTRACTIONS    . ORIF ANKLE FRACTURE Left 09/13/2018   Procedure: OPEN REDUCTION INTERNAL FIXATION (ORIF) LATERAL LEFT ANKLE FRACTURE;  Surgeon: Dorna Leitz, MD;  Location: [facility];  Service: Orthopedics;  Laterality: Left;  . SYNDESMOSIS REPAIR Left 09/13/2018   Procedure: OPEN REDUCTION INTERNAL FIXATION SYNDESMOSIS REPAIR LEFT ANKLE;  Surgeon: Dorna Leitz, MD;  Location: [facility];  Service: Orthopedics;  Laterality: Left;    Family History  Problem Relation Age of Onset  . Heart attack Mother   . Diabetes Sister   . Diabetes Brother    Social History:  reports that he quit smoking about 29 years ago. His smoking use included pipe, cigars, and cigarettes. He has never used smokeless tobacco. He reports current alcohol use. He reports that he does not use drugs.  Allergies: No Known Allergies  Medications Prior to Admission  Medication Sig Dispense Refill  . acetaminophen (TYLENOL) 500 MG tablet Take 1,000 mg by mouth every 6 (six) hours as needed (pain).     Marland Kitchen allopurinol (ZYLOPRIM) 300 MG tablet Take 300 mg by mouth daily.    Marland Kitchen aspirin EC 81 MG  tablet Take 81 mg by mouth daily.    Marland Kitchen atenolol (TENORMIN) 25 MG tablet Take 25 mg by mouth daily.     Marland Kitchen atorvastatin (LIPITOR) 40 MG tablet Take 40 mg by mouth daily.    Marland Kitchen CIALIS 20 MG tablet Take 20 mg by mouth as needed for erectile dysfunction.  6  . diclofenac sodium (VOLTAREN) 1 % GEL Apply 1 application topically as needed (pain).     Marland Kitchen diphenhydrAMINE (SIMPLY SLEEP) 25 MG tablet Take 25 mg by mouth at bedtime as needed for sleep.    . famotidine (PEPCID) 20 MG tablet Take 20 mg by mouth at bedtime as  needed for heartburn.     . hydrochlorothiazide (HYDRODIURIL) 25 MG tablet Take 25 mg by mouth daily.     Marland Kitchen lidocaine-prilocaine (EMLA) cream Apply to affected area once (Patient taking differently: Apply 1 application topically daily as needed (pain). Apply to affected area once) 30 g 3  . lisinopril (ZESTRIL) 40 MG tablet Take 1 tablet (40 mg total) by mouth daily for 30 days. 30 tablet 0  . loratadine (CLARITIN) 10 MG tablet Take 10 mg by mouth daily.    . meloxicam (MOBIC) 15 MG tablet Take 15 mg by mouth daily as needed for pain.     . metFORMIN (GLUCOPHAGE-XR) 500 MG 24 hr tablet Take 500 mg by mouth every evening.     . NON FORMULARY Take 2 tablets by mouth daily. Blood Sugar Defense     . ondansetron (ZOFRAN) 8 MG tablet Take 1 tablet (8 mg total) by mouth 2 (two) times daily as needed for refractory nausea / vomiting. Start on day 3 after cyclophosphamide chemotherapy. 30 tablet 1  . potassium chloride (K-DUR) 10 MEQ tablet Take 10 mEq by mouth daily.     . predniSONE (DELTASONE) 20 MG tablet Take 3 tablets (60 mg total) by mouth daily. Take on days 1-5 of chemotherapy. 15 tablet 6  . prochlorperazine (COMPAZINE) 10 MG tablet Take 1 tablet (10 mg total) by mouth every 6 (six) hours as needed (Nausea or vomiting). 30 tablet 6  . tamsulosin (FLOMAX) 0.4 MG CAPS capsule Take 0.4 mg by mouth daily.    . traMADol (ULTRAM) 50 MG tablet Take 1 tablet (50 mg total) by mouth every 6 (six) hours as needed. (Patient taking differently: Take 50 mg by mouth every 6 (six) hours as needed for moderate pain. ) 60 tablet 0  . CINNAMON PO Take 1 tablet by mouth daily.    . famciclovir (FAMVIR) 500 MG tablet Take 1 tablet (500 mg total) by mouth daily. (Patient not taking: Reported on 11/29/2018) 30 tablet 8  . HYDROcodone-acetaminophen (NORCO/VICODIN) 5-325 MG tablet Take 1-2 tablets by mouth every 6 (six) hours as needed for moderate pain. (Patient not taking: Reported on 12/14/2018) 30 tablet 0  . LORazepam  (ATIVAN) 0.5 MG tablet Take 1 tablet (0.5 mg total) by mouth every 6 (six) hours as needed (Nausea or vomiting). 30 tablet 0  . oxyCODONE (ROXICODONE) 5 MG immediate release tablet Take 1 tablet (5 mg total) by mouth every 4 (four) hours as needed for severe pain. 8 tablet 0  . tiZANidine (ZANAFLEX) 4 MG tablet Take 1 tablet (4 mg total) by mouth every 12 (twelve) hours as needed for muscle spasms. 30 tablet 0    Results for orders placed or performed during the hospital encounter of 12/19/18 (from the past 48 hour(s))  SARS Coronavirus 2 (CEPHEID - Performed in [facility]  hospital lab), Hosp Order     Status: None   Collection Time: 12/19/18  8:58 AM   Specimen: Nasopharyngeal Swab  Result Value Ref Range   SARS Coronavirus 2 NEGATIVE NEGATIVE    Comment: (NOTE) If result is NEGATIVE SARS-CoV-2 target nucleic acids are NOT DETECTED. The SARS-CoV-2 RNA is generally detectable in upper and lower  respiratory specimens during the acute phase of infection. The lowest  concentration of SARS-CoV-2 viral copies this assay can detect is 250  copies / mL. A negative result does not preclude SARS-CoV-2 infection  and should not be used as the sole basis for treatment or other  patient management decisions.  A negative result may occur with  improper specimen collection / handling, submission of specimen other  than nasopharyngeal swab, presence of viral mutation(s) within the  areas targeted by this assay, and inadequate number of viral copies  (<250 copies / mL). A negative result must be combined with clinical  observations, patient history, and epidemiological information. If result is POSITIVE SARS-CoV-2 target nucleic acids are DETECTED. The SARS-CoV-2 RNA is generally detectable in upper and lower  respiratory specimens dur ing the acute phase of infection.  Positive  results are indicative of active infection with SARS-CoV-2.  Clinical  correlation with patient history and other  diagnostic information is  necessary to determine patient infection status.  Positive results do  not rule out bacterial infection or co-infection with other viruses. If result is PRESUMPTIVE POSTIVE SARS-CoV-2 nucleic acids MAY BE PRESENT.   A presumptive positive result was obtained on the submitted specimen  and confirmed on repeat testing.  While 2019 novel coronavirus  (SARS-CoV-2) nucleic acids may be present in the submitted sample  additional confirmatory testing may be necessary for epidemiological  and / or clinical management purposes  to differentiate between  SARS-CoV-2 and other Sarbecovirus currently known to infect humans.  If clinically indicated additional testing with an alternate test  methodology [phone]) is advised. The SARS-CoV-2 RNA is generally  detectable in upper and lower respiratory sp ecimens during the acute  phase of infection. The expected result is Negative. Fact Sheet for Patients:  StrictlyIdeas.no Fact Sheet for Healthcare Providers: BankingDealers.co.za This test is not yet approved or cleared by the [place] FDA and has been authorized for detection and/or diagnosis of SARS-CoV-2 by FDA under an Emergency Use Authorization (EUA).  This EUA will remain in effect (meaning this test can be used) for the duration of the COVID-19 declaration under Section 564(b)(1) of the Act, 21 U.S.C. section 360bbb-3(b)(1), unless the authorization is terminated or revoked sooner. Performed at [facility], [place] [address], [place], [place] [address]   Glucose, capillary     Status: Abnormal   Collection Time: 12/19/18  9:07 AM  Result Value Ref Range   Glucose-Capillary 166 (H) 70 - 99 mg/dL   Comment 1 Notify RN    Comment 2 Document in Chart   Hemoglobin A1c     Status: Abnormal   Collection Time: 12/19/18 10:03 AM  Result Value Ref Range   Hgb A1c MFr Bld 8.0 (H) 4.8 - 5.6 %    Comment:  (NOTE) Pre diabetes:          5.7%-6.4% Diabetes:              >6.4% Glycemic control for   <7.0% adults with diabetes    Mean Plasma Glucose 182.9 mg/dL    Comment: Performed at [facility], [address] [place], [place] [address]   No results found.  Review of Systems  Constitutional: Negative.   Eyes: Negative.   Respiratory: Negative.   Cardiovascular: Negative.   Gastrointestinal: Negative.   Musculoskeletal:       Left ankle swelling and deformity  Skin: Negative.   Neurological:       Bilateral lower extremity numbness  Psychiatric/Behavioral: Negative.     Blood pressure (!) 149/108, pulse 100, temperature 98.8 F (37.1 C), temperature source Oral, resp. rate 20, height 6\' 2"  (1.88 m), weight 127 kg, SpO2 98 %. Physical Exam  Constitutional: He appears well-developed.  HENT:  Head: Normocephalic.  Eyes: Conjunctivae are normal.  Neck: Neck supple.  Cardiovascular: Normal rate.  Respiratory: Effort normal.  GI: Soft.  Musculoskeletal:     Comments: Left lower extremity in a splint.  Toes exposed are warm and well-perfused.  He is able to wiggle toes.  Does not have sensation to light touch about the toes.  Proximal to the splint he endorses sensation to light touch.  No evidence of bilateral upper extremity injury no evidence of right lower extremity injury  Neurological: He is alert.  Skin: Skin is warm.  Psychiatric: He has a normal mood and affect.     Assessment/Plan We will proceed with revision open treatment of left ankle with removal of hardware and syndesmotic fixation.  It is difficult to reduce the ankle due to the fragments within the medial gutter may need to make a medial incision for arthrotomy and deltoid ligament repair which will be decided at the time of surgery.  Patient understands the risk, benefits alternatives surgery which include but are not limited to wound healing complications, infection, nonunion, malunion, need for further  surgery, demonstrating structural development of arthritis.  He also understands the increased risk of complications given that he is a noncontrolled diabetic with peripheral neuropathy.  Patient takes an 81 mg aspirin otherwise does not take any anticoagulant will be placed on a 325 mg aspirin for DVT prophylaxis postoperatively.  Plan is for discharge home from the PACU.  Erle Crocker, MD 12/19/2018, 10:21 AM

## 2018-12-19 NOTE — Discharge Instructions (Signed)
DR. [facility] POST-OP INSTRUCTIONS   Pain Management  1. Keep your foot elevated above heart level.  Make sure that your heel hangs free ('floats'). 2. Take all prescribed medication as directed. 3. If taking narcotic pain medication you may want to use an over-the-counter stool softener to avoid constipation. 4. You may take over-the-counter NSAIDs (ibuprofen, naproxen, etc.) as well as over-the-counter acetaminophen as directed on the packaging as a supplement for your pain and may also use it to wean away from the prescription medication.  Activity ? Non-weightbearing - strict. ? Postoperatively, you will be placed into a splint which stays on for 2 weeks and then will be changed at your first postop visit.  First Postoperative Visit 1. Your first postop visit will be at least 2 weeks after surgery.  This should be scheduled when you schedule surgery. 2. If you do not have a postoperative visit scheduled please call [phone] to schedule an appointment. 3. At the appointment your incision will be evaluated for suture removal, x-rays will be obtained if necessary.  General Instructions 1. Swelling is very common after foot and ankle surgery.  It often takes 3 months for the foot and ankle to begin to feel comfortable.  Some amount of swelling will persist for 6-12 months. 2. DO NOT change the dressing.  If there is a problem with the dressing (too tight, loose, gets wet, etc.) please contact Dr. Pollie Friar office. 3. DO NOT get the dressing wet.  For showers you can use an over-the-counter cast cover or wrap a washcloth around the top of your dressing and then cover it with a plastic bag and tape it to your leg. 4. DO NOT soak the incision (no tubs, pools, bath, etc.) until you have approval from Dr. Lucia Gaskins.  Contact Dr. Huel Cote office or go to Emergency Room if: 1. Temperature above 101 F. 2. Increasing pain that is unresponsive to pain medication or  elevation 3. Excessive redness or swelling in your foot 4. Dressing problems - excessive bloody drainage, looseness or tightness, or if dressing gets wet 5. Develop pain, swelling, warmth, or discoloration of your calf

## 2018-12-19 NOTE — Anesthesia Procedure Notes (Signed)
Anesthesia Regional Block: Adductor canal block   Pre-Anesthetic Checklist: ,, timeout performed, Correct Patient, Correct Site, Correct Laterality, Correct Procedure, Correct Position, site marked, Risks and benefits discussed,  Surgical consent,  Pre-op evaluation,  At surgeon's request and post-op pain management  Laterality: Left and Lower  Prep: chloraprep       Needles:  Injection technique: Single-shot  Needle Type: Echogenic Needle     Needle Length: 9cm  Needle Gauge: 21     Additional Needles:   Procedures:,,,, ultrasound used (permanent image in chart),,,,  Narrative:  Start time: 12/19/2018 10:56 AM End time: 12/19/2018 11:02 AM Injection made incrementally with aspirations every 5 mL.  Performed by: Personally  Anesthesiologist: Annye Asa, MD  Additional Notes: Pt identified in Holding room.  Monitors applied. Working IV access confirmed. Sterile prep L thigh.  #21ga ECHOgenic needle into adductor canal with US guidance.  20cc 0.75% Ropivacaine injected incrementally after negative test dose.  Patient asymptomatic, VSS, no heme aspirated, tolerated well.  Jenita Seashore, MD

## 2018-12-19 NOTE — Anesthesia Procedure Notes (Signed)
Procedure Name: Intubation Date/Time: 12/19/2018 11:35 AM Performed by: Alain Marion, CRNA Pre-anesthesia Checklist: Patient identified, Emergency Drugs available, Suction available and Patient being monitored Patient Re-evaluated:Patient Re-evaluated prior to induction Oxygen Delivery Method: Circle system utilized Preoxygenation: Pre-oxygenation with 100% oxygen Induction Type: IV induction Ventilation: Mask ventilation without difficulty Laryngoscope Size: Mac and 4 Grade View: Grade I Tube type: Oral Tube size: 7.5 mm Number of attempts: 1 Airway Equipment and Method: Stylet Placement Confirmation: ETT inserted through vocal cords under direct vision,  positive ETCO2 and breath sounds checked- equal and bilateral Secured at: 23 cm Tube secured with: Tape Dental Injury: Teeth and Oropharynx as per pre-operative assessment

## 2018-12-19 NOTE — Transfer of Care (Signed)
Immediate Anesthesia Transfer of Care Note  Patient: Ronald Rice  Procedure(s) Performed: LEFT ANKLE REVISION FIXATION, REMOVAL OF HARDWARE, OPEN TREATMENT, SYNDESMOSIS, POSSIBLE ANKLE ARTHROTOMY, DELTOID LIGAMENT REPAIR (Left Ankle)  Patient Location: PACU  Anesthesia Type:General  Level of Consciousness: awake, alert  and oriented  Airway & Oxygen Therapy: Patient Spontanous Breathing and Patient connected to face mask oxygen  Post-op Assessment: Report given to RN and Post -op Vital signs reviewed and stable  Post vital signs: Reviewed and stable  Last Vitals:  Vitals Value Taken Time  BP 118/77 12/19/18 1330  Temp    Pulse 102 12/19/18 1330  Resp 18 12/19/18 1330  SpO2 96 % 12/19/18 1330  Vitals shown include unvalidated device data.  Last Pain:  Vitals:   12/19/18 1115  TempSrc:   PainSc: 0-No pain         Complications: No apparent anesthesia complications

## 2018-12-20 ENCOUNTER — Other Ambulatory Visit: Payer: Self-pay

## 2018-12-20 ENCOUNTER — Inpatient Hospital Stay: Payer: Medicare HMO | Admitting: Hematology & Oncology

## 2018-12-20 ENCOUNTER — Encounter (HOSPITAL_COMMUNITY): Payer: Self-pay | Admitting: Emergency Medicine

## 2018-12-20 ENCOUNTER — Ambulatory Visit (HOSPITAL_COMMUNITY): Payer: Medicare HMO

## 2018-12-20 ENCOUNTER — Observation Stay (HOSPITAL_COMMUNITY): Payer: Medicare HMO

## 2018-12-20 ENCOUNTER — Inpatient Hospital Stay: Payer: Medicare HMO

## 2018-12-20 ENCOUNTER — Inpatient Hospital Stay (HOSPITAL_COMMUNITY)
Admission: EM | Admit: 2018-12-20 | Discharge: 2018-12-25 | DRG: 493 | Disposition: A | Discharge status: Skilled Nursing Facility | Payer: Medicare HMO | Source: Other Acute Inpatient Hospital | Attending: Internal Medicine | Admitting: Internal Medicine

## 2018-12-20 DIAGNOSIS — R509 Fever, unspecified: Secondary | ICD-10-CM

## 2018-12-20 DIAGNOSIS — C9190 Lymphoid leukemia, unspecified not having achieved remission: Secondary | ICD-10-CM | POA: Diagnosis not present

## 2018-12-20 DIAGNOSIS — S82832G Other fracture of upper and lower end of left fibula, subsequent encounter for closed fracture with delayed healing: Secondary | ICD-10-CM | POA: Diagnosis not present

## 2018-12-20 DIAGNOSIS — C9110 Chronic lymphocytic leukemia of B-cell type not having achieved remission: Secondary | ICD-10-CM | POA: Diagnosis present

## 2018-12-20 DIAGNOSIS — R5082 Postprocedural fever: Secondary | ICD-10-CM | POA: Diagnosis present

## 2018-12-20 DIAGNOSIS — E1165 Type 2 diabetes mellitus with hyperglycemia: Secondary | ICD-10-CM | POA: Diagnosis present

## 2018-12-20 DIAGNOSIS — Z833 Family history of diabetes mellitus: Secondary | ICD-10-CM

## 2018-12-20 DIAGNOSIS — R2689 Other abnormalities of gait and mobility: Secondary | ICD-10-CM | POA: Diagnosis not present

## 2018-12-20 DIAGNOSIS — G4733 Obstructive sleep apnea (adult) (pediatric): Secondary | ICD-10-CM | POA: Diagnosis present

## 2018-12-20 DIAGNOSIS — Z79891 Long term (current) use of opiate analgesic: Secondary | ICD-10-CM

## 2018-12-20 DIAGNOSIS — R7989 Other specified abnormal findings of blood chemistry: Secondary | ICD-10-CM | POA: Diagnosis not present

## 2018-12-20 DIAGNOSIS — E785 Hyperlipidemia, unspecified: Secondary | ICD-10-CM | POA: Diagnosis present

## 2018-12-20 DIAGNOSIS — R410 Disorientation, unspecified: Secondary | ICD-10-CM | POA: Diagnosis present

## 2018-12-20 DIAGNOSIS — T84098A Other mechanical complication of other internal joint prosthesis, initial encounter: Secondary | ICD-10-CM | POA: Diagnosis present

## 2018-12-20 DIAGNOSIS — Z981 Arthrodesis status: Secondary | ICD-10-CM

## 2018-12-20 DIAGNOSIS — N400 Enlarged prostate without lower urinary tract symptoms: Secondary | ICD-10-CM | POA: Diagnosis present

## 2018-12-20 DIAGNOSIS — R5381 Other malaise: Secondary | ICD-10-CM | POA: Diagnosis not present

## 2018-12-20 DIAGNOSIS — C8338 Diffuse large B-cell lymphoma, lymph nodes of multiple sites: Secondary | ICD-10-CM

## 2018-12-20 DIAGNOSIS — G8918 Other acute postprocedural pain: Secondary | ICD-10-CM

## 2018-12-20 DIAGNOSIS — R52 Pain, unspecified: Secondary | ICD-10-CM | POA: Diagnosis not present

## 2018-12-20 DIAGNOSIS — E1140 Type 2 diabetes mellitus with diabetic neuropathy, unspecified: Secondary | ICD-10-CM | POA: Diagnosis present

## 2018-12-20 DIAGNOSIS — Z471 Aftercare following joint replacement surgery: Secondary | ICD-10-CM | POA: Diagnosis not present

## 2018-12-20 DIAGNOSIS — Z20828 Contact with and (suspected) exposure to other viral communicable diseases: Secondary | ICD-10-CM | POA: Diagnosis present

## 2018-12-20 DIAGNOSIS — M2550 Pain in unspecified joint: Secondary | ICD-10-CM | POA: Diagnosis not present

## 2018-12-20 DIAGNOSIS — R1012 Left upper quadrant pain: Secondary | ICD-10-CM | POA: Diagnosis present

## 2018-12-20 DIAGNOSIS — E119 Type 2 diabetes mellitus without complications: Secondary | ICD-10-CM | POA: Diagnosis not present

## 2018-12-20 DIAGNOSIS — Z791 Long term (current) use of non-steroidal anti-inflammatories (NSAID): Secondary | ICD-10-CM

## 2018-12-20 DIAGNOSIS — Z7952 Long term (current) use of systemic steroids: Secondary | ICD-10-CM

## 2018-12-20 DIAGNOSIS — Z9889 Other specified postprocedural states: Secondary | ICD-10-CM | POA: Diagnosis not present

## 2018-12-20 DIAGNOSIS — M1990 Unspecified osteoarthritis, unspecified site: Secondary | ICD-10-CM | POA: Diagnosis present

## 2018-12-20 DIAGNOSIS — Z79899 Other long term (current) drug therapy: Secondary | ICD-10-CM

## 2018-12-20 DIAGNOSIS — K219 Gastro-esophageal reflux disease without esophagitis: Secondary | ICD-10-CM | POA: Diagnosis present

## 2018-12-20 DIAGNOSIS — Z87891 Personal history of nicotine dependence: Secondary | ICD-10-CM | POA: Diagnosis not present

## 2018-12-20 DIAGNOSIS — S93492D Sprain of other ligament of left ankle, subsequent encounter: Secondary | ICD-10-CM | POA: Diagnosis not present

## 2018-12-20 DIAGNOSIS — Z7401 Bed confinement status: Secondary | ICD-10-CM | POA: Diagnosis not present

## 2018-12-20 DIAGNOSIS — Z7982 Long term (current) use of aspirin: Secondary | ICD-10-CM | POA: Diagnosis not present

## 2018-12-20 DIAGNOSIS — M6281 Muscle weakness (generalized): Secondary | ICD-10-CM | POA: Diagnosis not present

## 2018-12-20 DIAGNOSIS — E669 Obesity, unspecified: Secondary | ICD-10-CM | POA: Diagnosis present

## 2018-12-20 DIAGNOSIS — N281 Cyst of kidney, acquired: Secondary | ICD-10-CM | POA: Diagnosis present

## 2018-12-20 DIAGNOSIS — Z6835 Body mass index (BMI) 35.0-35.9, adult: Secondary | ICD-10-CM

## 2018-12-20 DIAGNOSIS — R2681 Unsteadiness on feet: Secondary | ICD-10-CM | POA: Diagnosis not present

## 2018-12-20 DIAGNOSIS — I2510 Atherosclerotic heart disease of native coronary artery without angina pectoris: Secondary | ICD-10-CM | POA: Diagnosis present

## 2018-12-20 DIAGNOSIS — I10 Essential (primary) hypertension: Secondary | ICD-10-CM | POA: Diagnosis present

## 2018-12-20 DIAGNOSIS — E872 Acidosis: Secondary | ICD-10-CM | POA: Diagnosis not present

## 2018-12-20 DIAGNOSIS — Z8249 Family history of ischemic heart disease and other diseases of the circulatory system: Secondary | ICD-10-CM

## 2018-12-20 DIAGNOSIS — Z7984 Long term (current) use of oral hypoglycemic drugs: Secondary | ICD-10-CM

## 2018-12-20 DIAGNOSIS — A419 Sepsis, unspecified organism: Secondary | ICD-10-CM | POA: Diagnosis not present

## 2018-12-20 DIAGNOSIS — T8144XA Sepsis following a procedure, initial encounter: Secondary | ICD-10-CM | POA: Diagnosis not present

## 2018-12-20 LAB — CBC WITH DIFFERENTIAL/PLATELET
Abs Immature Granulocytes: 0.06 10*3/uL (ref 0.00–0.07)
Basophils Absolute: 0 10*3/uL (ref 0.0–0.1)
Basophils Relative: 0 %
Eosinophils Absolute: 0.1 10*3/uL (ref 0.0–0.5)
Eosinophils Relative: 1 %
HCT: 30.1 % — ABNORMAL LOW (ref 39.0–52.0)
Hemoglobin: 10.9 g/dL — ABNORMAL LOW (ref 13.0–17.0)
Immature Granulocytes: 1 %
Lymphocytes Relative: 9 %
Lymphs Abs: 0.9 10*3/uL (ref 0.7–4.0)
MCH: 31.2 pg (ref 26.0–34.0)
MCHC: 36.2 g/dL — ABNORMAL HIGH (ref 30.0–36.0)
MCV: 86.2 fL (ref 80.0–100.0)
Monocytes Absolute: 1.7 10*3/uL — ABNORMAL HIGH (ref 0.1–1.0)
Monocytes Relative: 17 %
Neutro Abs: 6.9 10*3/uL (ref 1.7–7.7)
Neutrophils Relative %: 72 %
Platelets: 212 10*3/uL (ref 150–400)
RBC: 3.49 MIL/uL — ABNORMAL LOW (ref 4.22–5.81)
RDW: 13.7 % (ref 11.5–15.5)
WBC: 9.6 10*3/uL (ref 4.0–10.5)
nRBC: 0 % (ref 0.0–0.2)

## 2018-12-20 LAB — BASIC METABOLIC PANEL
Anion gap: 11 (ref 5–15)
BUN: 9 mg/dL (ref 8–23)
CO2: 24 mmol/L (ref 22–32)
Calcium: 8.1 mg/dL — ABNORMAL LOW (ref 8.9–10.3)
Chloride: 101 mmol/L (ref 98–111)
Creatinine, Ser: 1.17 mg/dL (ref 0.61–1.24)
GFR calc Af Amer: >60 mL/min (ref >60)
GFR calc non Af Amer: >60 mL/min (ref >60)
Glucose, Bld: 192 mg/dL — ABNORMAL HIGH (ref 70–99)
Potassium: 3.8 mmol/L (ref 3.5–5.1)
Sodium: 136 mmol/L (ref 135–145)

## 2018-12-20 LAB — HEPATIC FUNCTION PANEL
ALT: 24 U/L (ref 0–44)
AST: 24 U/L (ref 15–41)
Albumin: 3.4 g/dL — ABNORMAL LOW (ref 3.5–5.0)
Alkaline Phosphatase: 46 U/L (ref 38–126)
Bilirubin, Direct: 0.2 mg/dL (ref 0.0–0.2)
Indirect Bilirubin: 0.9 mg/dL (ref 0.3–0.9)
Total Bilirubin: 1.1 mg/dL (ref 0.3–1.2)
Total Protein: 6 g/dL — ABNORMAL LOW (ref 6.5–8.1)

## 2018-12-20 LAB — URINALYSIS, ROUTINE W REFLEX MICROSCOPIC
Bilirubin Urine: NEGATIVE
Glucose, UA: NEGATIVE mg/dL
Hgb urine dipstick: NEGATIVE
Ketones, ur: NEGATIVE mg/dL
Leukocytes,Ua: NEGATIVE
Nitrite: NEGATIVE
Protein, ur: NEGATIVE mg/dL
Specific Gravity, Urine: 1.011 (ref 1.005–1.030)
pH: 5 (ref 5.0–8.0)

## 2018-12-20 LAB — SARS CORONAVIRUS 2 BY RT PCR (HOSPITAL ORDER, PERFORMED IN ~~LOC~~ HOSPITAL LAB): SARS Coronavirus 2: NEGATIVE

## 2018-12-20 LAB — TROPONIN I (HIGH SENSITIVITY): Troponin I (High Sensitivity): 10 ng/L (ref <18)

## 2018-12-20 LAB — STREP PNEUMONIAE URINARY ANTIGEN: Strep Pneumo Urinary Antigen: NEGATIVE

## 2018-12-20 LAB — D-DIMER, QUANTITATIVE: D-Dimer, Quant: 1.42 ug/mL-FEU — ABNORMAL HIGH (ref 0.00–0.50)

## 2018-12-20 LAB — GLUCOSE, CAPILLARY: Glucose-Capillary: 189 mg/dL — ABNORMAL HIGH (ref 70–99)

## 2018-12-20 LAB — SEDIMENTATION RATE: Sed Rate: 18 mm/hr — ABNORMAL HIGH (ref 0–16)

## 2018-12-20 MED ORDER — INSULIN ASPART 100 UNIT/ML ~~LOC~~ SOLN: 0.0000 [IU] | Freq: Every day | SUBCUTANEOUS | Status: DC

## 2018-12-20 MED ORDER — PROCHLORPERAZINE MALEATE 5 MG PO TABS: 10.0000 mg | ORAL_TABLET | Freq: Four times a day (QID) | ORAL | Status: DC | PRN

## 2018-12-20 MED ORDER — TIZANIDINE HCL 4 MG PO TABS
4.0000 mg | ORAL_TABLET | Freq: Two times a day (BID) | ORAL | Status: DC | PRN
  Administered 2018-12-20 – 2018-12-25 (×8): 4 mg via ORAL
  Filled 2018-12-20 (×8): qty 1

## 2018-12-20 MED ORDER — VANCOMYCIN HCL IN DEXTROSE 1-5 GM/200ML-% IV SOLN
1000.0000 mg | Freq: Two times a day (BID) | INTRAVENOUS | Status: DC
  Administered 2018-12-20 – 2018-12-22 (×4): 1000 mg via INTRAVENOUS
  Filled 2018-12-20 (×4): qty 200

## 2018-12-20 MED ORDER — ATORVASTATIN CALCIUM 40 MG PO TABS
40.0000 mg | ORAL_TABLET | Freq: Every day | ORAL | Status: DC
  Administered 2018-12-20 – 2018-12-25 (×6): 40 mg via ORAL
  Filled 2018-12-20 (×6): qty 1

## 2018-12-20 MED ORDER — TAMSULOSIN HCL 0.4 MG PO CAPS
0.4000 mg | ORAL_CAPSULE | Freq: Every day | ORAL | Status: DC
  Administered 2018-12-20 – 2018-12-25 (×6): 0.4 mg via ORAL
  Filled 2018-12-20 (×6): qty 1

## 2018-12-20 MED ORDER — MORPHINE SULFATE 4 MG/ML IJ SOLN
4.00 | INTRAMUSCULAR | Status: DC
Start: ? — End: 2018-12-20

## 2018-12-20 MED ORDER — HYDROCHLOROTHIAZIDE 25 MG PO TABS
25.0000 mg | ORAL_TABLET | Freq: Every day | ORAL | Status: DC
  Administered 2018-12-20 – 2018-12-25 (×6): 25 mg via ORAL
  Filled 2018-12-20 (×6): qty 1

## 2018-12-20 MED ORDER — ALLOPURINOL 300 MG PO TABS
300.0000 mg | ORAL_TABLET | Freq: Every day | ORAL | Status: DC
  Administered 2018-12-21 – 2018-12-25 (×5): 300 mg via ORAL
  Filled 2018-12-20 (×5): qty 1

## 2018-12-20 MED ORDER — LISINOPRIL 20 MG PO TABS
40.0000 mg | ORAL_TABLET | Freq: Every day | ORAL | Status: DC
  Administered 2018-12-20 – 2018-12-25 (×6): 40 mg via ORAL
  Filled 2018-12-20 (×6): qty 2

## 2018-12-20 MED ORDER — LORAZEPAM 0.5 MG PO TABS
0.5000 mg | ORAL_TABLET | Freq: Four times a day (QID) | ORAL | Status: DC | PRN
  Administered 2018-12-20: 0.5 mg via ORAL
  Filled 2018-12-20: qty 1

## 2018-12-20 MED ORDER — INSULIN ASPART 100 UNIT/ML ~~LOC~~ SOLN
0.0000 [IU] | Freq: Three times a day (TID) | SUBCUTANEOUS | Status: DC
  Administered 2018-12-21 – 2018-12-22 (×5): 2 [IU] via SUBCUTANEOUS
  Administered 2018-12-22 – 2018-12-23 (×2): 1 [IU] via SUBCUTANEOUS
  Administered 2018-12-23 – 2018-12-24 (×4): 2 [IU] via SUBCUTANEOUS
  Administered 2018-12-24: 1 [IU] via SUBCUTANEOUS
  Administered 2018-12-25: 2 [IU] via SUBCUTANEOUS
  Administered 2018-12-25: 1 [IU] via SUBCUTANEOUS
  Administered 2018-12-25: 2 [IU] via SUBCUTANEOUS

## 2018-12-20 MED ORDER — IOHEXOL 350 MG/ML SOLN
100.0000 mL | Freq: Once | INTRAVENOUS | Status: AC | PRN
  Administered 2018-12-20: 100 mL via INTRAVENOUS

## 2018-12-20 MED ORDER — LORATADINE 10 MG PO TABS
10.0000 mg | ORAL_TABLET | Freq: Every day | ORAL | Status: DC
  Administered 2018-12-20 – 2018-12-25 (×6): 10 mg via ORAL
  Filled 2018-12-20 (×6): qty 1

## 2018-12-20 MED ORDER — DIPHENHYDRAMINE HCL 25 MG PO CAPS
25.0000 mg | ORAL_CAPSULE | Freq: Every evening | ORAL | Status: DC | PRN
  Administered 2018-12-22: 25 mg via ORAL
  Filled 2018-12-20: qty 1

## 2018-12-20 MED ORDER — ENOXAPARIN SODIUM 40 MG/0.4ML ~~LOC~~ SOLN
40.0000 mg | SUBCUTANEOUS | Status: DC
  Administered 2018-12-20 – 2018-12-21 (×2): 40 mg via SUBCUTANEOUS
  Filled 2018-12-20 (×3): qty 0.4

## 2018-12-20 MED ORDER — METFORMIN HCL ER 500 MG PO TB24: 500.0000 mg | ORAL_TABLET | Freq: Every evening | ORAL | Status: DC

## 2018-12-20 MED ORDER — ASPIRIN 325 MG PO TABS
325.0000 mg | ORAL_TABLET | Freq: Every day | ORAL | Status: DC
  Administered 2018-12-20 – 2018-12-25 (×6): 325 mg via ORAL
  Filled 2018-12-20 (×6): qty 1

## 2018-12-20 MED ORDER — SODIUM CHLORIDE 0.9 % IV SOLN
2.0000 g | Freq: Three times a day (TID) | INTRAVENOUS | Status: DC
  Administered 2018-12-20 – 2018-12-22 (×6): 2 g via INTRAVENOUS
  Filled 2018-12-20 (×6): qty 2

## 2018-12-20 MED ORDER — SODIUM CHLORIDE 0.9 % IV SOLN
INTRAVENOUS | Status: AC
  Administered 2018-12-20: 06:00:00 via INTRAVENOUS

## 2018-12-20 MED ORDER — ATENOLOL 25 MG PO TABS
25.0000 mg | ORAL_TABLET | Freq: Every day | ORAL | Status: DC
  Administered 2018-12-20 – 2018-12-25 (×6): 25 mg via ORAL
  Filled 2018-12-20 (×6): qty 1

## 2018-12-20 MED ORDER — SODIUM CHLORIDE 0.9 % IV SOLN
2.0000 g | Freq: Once | INTRAVENOUS | Status: AC
  Administered 2018-12-20: 2 g via INTRAVENOUS
  Filled 2018-12-20: qty 2

## 2018-12-20 MED ORDER — ACETAMINOPHEN 325 MG PO TABS
650.0000 mg | ORAL_TABLET | Freq: Four times a day (QID) | ORAL | Status: DC | PRN
  Administered 2018-12-20 – 2018-12-23 (×6): 650 mg via ORAL
  Filled 2018-12-20 (×6): qty 2

## 2018-12-20 MED ORDER — VANCOMYCIN HCL 10 G IV SOLR
2500.0000 mg | INTRAVENOUS | Status: DC
  Administered 2018-12-20: 2500 mg via INTRAVENOUS
  Filled 2018-12-20: qty 2500

## 2018-12-20 MED ORDER — ACETAMINOPHEN 650 MG RE SUPP: 650.0000 mg | Freq: Four times a day (QID) | RECTAL | Status: DC | PRN

## 2018-12-20 MED ORDER — POTASSIUM CHLORIDE CRYS ER 10 MEQ PO TBCR
10.0000 meq | EXTENDED_RELEASE_TABLET | Freq: Every day | ORAL | Status: DC
  Administered 2018-12-20 – 2018-12-22 (×3): 10 meq via ORAL
  Filled 2018-12-20 (×3): qty 1

## 2018-12-20 MED ORDER — FAMOTIDINE 20 MG PO TABS
20.0000 mg | ORAL_TABLET | Freq: Every evening | ORAL | Status: DC | PRN
  Filled 2018-12-20: qty 1

## 2018-12-20 NOTE — Progress Notes (Signed)
Attempted echo at 1:45 pm. Patient stated he wanted nurse tech to clean him up first.

## 2018-12-20 NOTE — ED Notes (Signed)
Called ortho tech 

## 2018-12-20 NOTE — Progress Notes (Signed)
Pharmacy Antibiotic Note  Ronald Rice is a 73 y.o. male admitted on 12/20/2018 with pneumonia.  Pharmacy has been consulted for vancomycin and cefepime dosing. SCr stable at 1.17.  Plan: Cefepime 2gm IV q8 hours Change vancomycin to 1g IV q12h. Goal AUC 400-550. Expected AUC: 523 SCr used: 1.17 Monitor clinical progress, c/s, renal function F/u de-escalation plan/LOT, vancomycin levels as indicated   Height: 6\' 2"  (188 cm) Weight: 280 lb (127 kg) IBW/kg (Calculated) : 82.2  Temp (24hrs), Avg:98.3 F (36.8 C), Min:98 F (36.7 C), Max:98.9 F (37.2 C)  Recent Labs  Lab 12/19/18 0858 12/20/18 0252  WBC 6.7 9.6  CREATININE 1.11 1.17    Estimated Creatinine Clearance: 82 mL/min (by C-G formula based on SCr of 1.17 mg/dL).    No Known Allergies   Elicia Lamp, PharmD, BCPS Please check AMION for all [place] contact numbers Clinical Pharmacist 12/20/2018 12:03 PM

## 2018-12-20 NOTE — ED Notes (Signed)
Called [facility] Dr Rhona Raider

## 2018-12-20 NOTE — H&P (Addendum)
TRH H&P    Patient Demographics:    Ronald Rice, is a 73 y.o. male  MRN: [id number]  DOB - May 27, 1945  Admit Date - 12/20/2018  Referring MD/NP/PA: Ripley Fraise  Outpatient Primary MD for the patient is Deland Pretty, MD  Patient coming from:  home  Chief complaint-  fever   HPI:    Ronald Rice  is a 73 y.o. male,  Hypertension, Dm2, CAD, Gerd, CLL, OSA, who had R ankle ORIF revision 12/19/2018, apparently had fever and presents to ER. Pt denies cough, cp, palp, sob, n/v, abd pain, diarrhea, b rbpr, dysuria, hematuria.     In ED,  T 98.9 P 103  R 16  Bp 137/78  Pox 95% on RA Wt 127kg  Na 136, K 3.8, Bun 9, Creatinine 1.17 Glucose 192 Hga1c 8.0 Wbc 9.6, Hgb 10.9 Plt 212  CXR pending  Urinalysis pending  ED requesting admission for evaluation of fever.      Review of systems:    In addition to the HPI above,    No Headache, No changes with Vision or hearing, No problems swallowing food or Liquids, No Chest pain, Cough or Shortness of Breath, No Abdominal pain, No Nausea or Vomiting, bowel movements are regular, No Blood in stool or Urine, No dysuria, No new skin rashes or bruises, No new joints pains-aches,  No new weakness, tingling, numbness in any extremity, No recent weight gain or loss, No polyuria, polydypsia or polyphagia, No significant Mental Stressors.  All other systems reviewed and are negative.    Past History of the following :    Past Medical History:  Diagnosis Date  . Cancer ([place])    Leukemia   . CLL (chronic lymphocytic leukemia) ([place])    see records in care everywhere from [place]  . Coronary artery disease    minimal nonobstructive by 10/31/16 cath at [facility] in [place]  . Diabetes mellitus without complication ([place])   . Diffuse large B-cell lymphoma of lymph nodes of multiple regions ([place]) 10/31/2018  . Fracture    left ankle fracture with retained  hardware  . GERD (gastroesophageal reflux disease)   . Goals of care, counseling/discussion 10/31/2018  . Hypertension   . Sleep apnea    does not wear CPAP  . Wears dentures   . Wears glasses       Past Surgical History:  Procedure Laterality Date  . ANTERIOR CERVICAL DECOMP/DISCECTOMY FUSION N/A 04/20/2017   Procedure: ANTERIOR CERVICAL DECOMPRESSION FUSION, CERVICAL 3-4, CERVICAL 4-5 WITH INSTRUMENTATION AND ALLOGRAFT; REQUEST 3.5 HOURS;  Surgeon: Phylliss Bob, MD;  Location: [facility];  Service: Orthopedics;  Laterality: N/A;  ANTERIOR CERVICAL DECOMPRESSION FUSION, CERVICAL 3-4, CERVICAL 4-5 WITH INSTRUMENTATION AND ALLOGRAFT; REQUEST 3.5 HOURS  . BACK SURGERY    . CARDIAC CATHETERIZATION     in Care Everywhere 11/01/16  . COLONOSCOPY    . IR IMAGING GUIDED PORT INSERTION  11/10/2018  . MULTIPLE TOOTH EXTRACTIONS    . ORIF ANKLE FRACTURE Left 09/13/2018   Procedure: OPEN REDUCTION INTERNAL FIXATION (ORIF)  LATERAL LEFT ANKLE FRACTURE;  Surgeon: Dorna Leitz, MD;  Location: [facility];  Service: Orthopedics;  Laterality: Left;  . SYNDESMOSIS REPAIR Left 09/13/2018   Procedure: OPEN REDUCTION INTERNAL FIXATION SYNDESMOSIS REPAIR LEFT ANKLE;  Surgeon: Dorna Leitz, MD;  Location: [facility];  Service: Orthopedics;  Laterality: Left;      Social History:      Social History   Tobacco Use  . Smoking status: Former Smoker    Types: Pipe, Cigars, Cigarettes    Quit date: 10/31/1989    Years since quitting: 29.1  . Smokeless tobacco: Never Used  Substance Use Topics  . Alcohol use: Yes    Frequency: Never    Comment: occasional       Family History :     Family History  Problem Relation Age of Onset  . Heart attack Mother   . Diabetes Sister   . Diabetes Brother        Home Medications:   Prior to Admission medications   Medication Sig Start Date End Date Taking? Authorizing Provider  acetaminophen (TYLENOL) 500 MG tablet Take 1,000 mg by mouth every 6 (six) hours as needed  (pain).    Yes [provider]  allopurinol (ZYLOPRIM) 300 MG tablet Take 300 mg by mouth daily.   Yes [provider]  aspirin EC 81 MG tablet Take 81 mg by mouth daily.   Yes [provider]  atenolol (TENORMIN) 25 MG tablet Take 25 mg by mouth daily.    Yes [provider]  atorvastatin (LIPITOR) 40 MG tablet Take 40 mg by mouth daily.   Yes [provider]  CIALIS 20 MG tablet Take 20 mg by mouth as needed for erectile dysfunction. 10/06/17  Yes [provider]  CINNAMON PO Take 1 tablet by mouth daily.   Yes [provider]  diclofenac sodium (VOLTAREN) 1 % GEL Apply 1 application topically as needed (pain).  10/10/18  Yes [provider]  diphenhydrAMINE (SIMPLY SLEEP) 25 MG tablet Take 25 mg by mouth at bedtime as needed for sleep.   Yes [provider]  famotidine (PEPCID) 20 MG tablet Take 20 mg by mouth at bedtime as needed for heartburn.    Yes [provider]  hydrochlorothiazide (HYDRODIURIL) 25 MG tablet Take 25 mg by mouth daily.  12/01/18  Yes [provider]  lidocaine-prilocaine (EMLA) cream Apply to affected area once Patient taking differently: Apply 1 application topically daily as needed (pain). Apply to affected area once 11/02/18  Yes Ennever, Rudell Cobb, MD  lisinopril (ZESTRIL) 40 MG tablet Take 1 tablet (40 mg total) by mouth daily for 30 days. 10/29/18 12/20/27 Yes Donne Hazel, MD  loratadine (CLARITIN) 10 MG tablet Take 10 mg by mouth daily.   Yes [provider]  LORazepam (ATIVAN) 0.5 MG tablet Take 1 tablet (0.5 mg total) by mouth every 6 (six) hours as needed (Nausea or vomiting). 11/02/18  Yes Ennever, Rudell Cobb, MD  meloxicam (MOBIC) 15 MG tablet Take 15 mg by mouth daily as needed for pain.  11/21/18  Yes [provider]  metFORMIN (GLUCOPHAGE-XR) 500 MG 24 hr tablet Take 500 mg by mouth every evening.  11/28/18  Yes [provider]  NON FORMULARY  Take 2 tablets by mouth daily. Blood Sugar Defense    Yes [provider]  ondansetron (ZOFRAN) 8 MG tablet Take 1 tablet (8 mg total) by mouth 2 (two) times daily as needed for refractory nausea / vomiting.  Start on day 3 after cyclophosphamide chemotherapy. 11/02/18  Yes Ennever, Rudell Cobb, MD  potassium chloride (K-DUR) 10 MEQ tablet Take 10 mEq by mouth daily.    Yes [provider]  predniSONE (DELTASONE) 20 MG tablet Take 3 tablets (60 mg total) by mouth daily. Take on days 1-5 of chemotherapy. 11/02/18  Yes Ennever, Rudell Cobb, MD  prochlorperazine (COMPAZINE) 10 MG tablet Take 1 tablet (10 mg total) by mouth every 6 (six) hours as needed (Nausea or vomiting). 11/02/18  Yes Volanda Napoleon, MD  tamsulosin (FLOMAX) 0.4 MG CAPS capsule Take 0.4 mg by mouth daily.   Yes [provider]  tiZANidine (ZANAFLEX) 4 MG tablet Take 1 tablet (4 mg total) by mouth every 12 (twelve) hours as needed for muscle spasms. 10/28/18  Yes Donne Hazel, MD  traMADol (ULTRAM) 50 MG tablet Take 1 tablet (50 mg total) by mouth every 6 (six) hours as needed. Patient taking differently: Take 50 mg by mouth every 6 (six) hours as needed for moderate pain.  10/30/18  Yes Ennever, Rudell Cobb, MD  aspirin (BAYER ASPIRIN) 325 MG tablet Take 1 tablet (325 mg total) by mouth daily. 12/19/18 12/19/19  Erle Crocker, MD  famciclovir (FAMVIR) 500 MG tablet Take 1 tablet (500 mg total) by mouth daily. Patient not taking: Reported on 11/29/2018 11/01/18   Volanda Napoleon, MD  HYDROcodone-acetaminophen (NORCO/VICODIN) 5-325 MG tablet Take 1-2 tablets by mouth every 6 (six) hours as needed for moderate pain. Patient not taking: Reported on 12/14/2018 09/13/18   Gary Fleet, PA-C  oxyCODONE (ROXICODONE) 5 MG immediate release tablet Take 1 tablet (5 mg total) by mouth every 4 (four) hours as needed for severe pain. 12/13/18   Maudie Flakes, MD     Allergies:    No Known Allergies   Physical Exam:   Vitals   Blood pressure 137/78, pulse (!) 103, temperature 98.9 F (37.2 C), temperature source Oral, resp. rate 16, height 6\' 2"  (1.88 m), weight 127 kg, SpO2 95 %.  1.  General: axox3  2. Psychiatric: Euthymic  3. Neurologic: cn2-12 intact, reflexes 2+ symmetric, motor 5/5 in all 4 ext  4. HEENMT:  Anicteric, pupils 1.80mm symmetric, direct, consensual, near intact  5. Respiratory : CTAB, ? Trace crackles left base, no wheeze  6. Cardiovascular : rrr s1, s2 no m/g/r  7. Gastrointestinal:  ABd: soft, nt, nd, +bs  8. Skin:  Ext: no c/c/e  9.Musculoskeletal:  Good ROM,  No adenopathy    Data Review:    CBC Recent Labs  Lab 12/19/18 0858 12/20/18 0252  WBC 6.7 9.6  HGB 12.4* 10.9*  HCT 34.7* 30.1*  PLT 230 212  MCV 86.8 86.2  MCH 31.0 31.2  MCHC 35.7 36.2*  RDW 13.5 13.7  LYMPHSABS  --  0.9  MONOABS  --  1.7*  EOSABS  --  0.1  BASOSABS  --  0.0   ------------------------------------------------------------------------------------------------------------------  Results for orders placed or performed during the hospital encounter of 12/20/18 (from the past 48 hour(s))  Basic metabolic panel     Status: Abnormal   Collection Time: 12/20/18  2:52 AM  Result Value Ref Range   Sodium 136 135 - 145 mmol/L   Potassium 3.8 3.5 - 5.1 mmol/L   Chloride 101 98 - 111 mmol/L   CO2 24 22 - 32 mmol/L   Glucose, Bld 192 (H) 70 - 99 mg/dL   BUN 9 8 - 23 mg/dL   Creatinine, Ser  1.17 0.61 - 1.24 mg/dL   Calcium 8.1 (L) 8.9 - 10.3 mg/dL   GFR calc non Af Amer >60 >60 mL/min   GFR calc Af Amer >60 >60 mL/min   Anion gap 11 5 - 15    Comment: Performed at [facility] [address] [place], [place] [address]  CBC with Differential/Platelet     Status: Abnormal   Collection Time: 12/20/18  2:52 AM  Result Value Ref Range   WBC 9.6 4.0 - 10.5 K/uL   RBC 3.49 (L) 4.22 - 5.81 MIL/uL   Hemoglobin 10.9 (L) 13.0 - 17.0 g/dL   HCT 30.1 (L) 39.0 - 52.0 %   MCV 86.2 80.0 -  100.0 fL   MCH 31.2 26.0 - 34.0 pg   MCHC 36.2 (H) 30.0 - 36.0 g/dL   RDW 13.7 11.5 - 15.5 %   Platelets 212 150 - 400 K/uL   nRBC 0.0 0.0 - 0.2 %   Neutrophils Relative % 72 %   Neutro Abs 6.9 1.7 - 7.7 K/uL   Lymphocytes Relative 9 %   Lymphs Abs 0.9 0.7 - 4.0 K/uL   Monocytes Relative 17 %   Monocytes Absolute 1.7 (H) 0.1 - 1.0 K/uL   Eosinophils Relative 1 %   Eosinophils Absolute 0.1 0.0 - 0.5 K/uL   Basophils Relative 0 %   Basophils Absolute 0.0 0.0 - 0.1 K/uL   Immature Granulocytes 1 %   Abs Immature Granulocytes 0.06 0.00 - 0.07 K/uL    Comment: Performed at [facility], [place] [address], [place], [place] [address]  Troponin I (High Sensitivity)     Status: None   Collection Time: 12/20/18  5:57 AM  Result Value Ref Range   Troponin I (High Sensitivity) 10 <18 ng/L    Comment: (NOTE) Elevated high sensitivity troponin I (hsTnI) values and significant  changes across serial measurements may suggest ACS but many other  chronic and acute conditions are known to elevate hsTnI results.  Refer to the "Links" section for chest pain algorithms and additional  guidance. Performed at [facility], [place] [address], [place], [place] [address]   Hepatic function panel     Status: Abnormal   Collection Time: 12/20/18  5:57 AM  Result Value Ref Range   Total Protein 6.0 (L) 6.5 - 8.1 g/dL   Albumin 3.4 (L) 3.5 - 5.0 g/dL   AST 24 15 - 41 U/L   ALT 24 0 - 44 U/L   Alkaline Phosphatase 46 38 - 126 U/L   Total Bilirubin 1.1 0.3 - 1.2 mg/dL   Bilirubin, Direct 0.2 0.0 - 0.2 mg/dL   Indirect Bilirubin 0.9 0.3 - 0.9 mg/dL    Comment: Performed at [facility], [place] [address] [place], [place] [address]  D-dimer, quantitative (not at [facility])     Status: Abnormal   Collection Time: 12/20/18  5:57 AM  Result Value Ref Range   D-Dimer, Quant 1.42 (H) 0.00 - 0.50 ug/mL-FEU    Comment: (NOTE) At the manufacturer cut-off of 0.50 ug/mL FEU, this assay has been documented  to exclude PE with a sensitivity and negative predictive value of 97 to 99%.  At this time, this assay has not been approved by the FDA to exclude DVT/VTE. Results should be correlated with clinical presentation. Performed at [facility], [place] [address], [place], [place] [address]   Urinalysis, Routine w reflex microscopic     Status:  None   Collection Time: 12/20/18  6:00 AM  Result Value Ref Range   Color, Urine YELLOW YELLOW   APPearance CLEAR CLEAR   Specific Gravity, Urine 1.011 1.005 - 1.030   pH 5.0 5.0 - 8.0   Glucose, UA NEGATIVE NEGATIVE mg/dL   Hgb urine dipstick NEGATIVE NEGATIVE   Bilirubin Urine NEGATIVE NEGATIVE   Ketones, ur NEGATIVE NEGATIVE mg/dL   Protein, ur NEGATIVE NEGATIVE mg/dL   Nitrite NEGATIVE NEGATIVE   Leukocytes,Ua NEGATIVE NEGATIVE    Comment: Performed at [facility] [address], [place],  [address]    Chemistries  Recent Labs  Lab 12/19/18 [phone] 12/20/18 0252 12/20/18 0557  NA 138 136  --   K 4.4 3.8  --   CL 100 101  --   CO2 27 24  --   GLUCOSE 173* 192*  --   BUN 8 9  --   CREATININE 1.11 1.17  --   CALCIUM 9.2 8.1*  --   AST  --   --  24  ALT  --   --  24  ALKPHOS  --   --  46  BILITOT  --   --  1.1   ------------------------------------------------------------------------------------------------------------------  ------------------------------------------------------------------------------------------------------------------ GFR: Estimated Creatinine Clearance: 82 mL/min (by C-G formula based on SCr of 1.17 mg/dL). Liver Function Tests: Recent Labs  Lab 12/20/18 0557  AST 24  ALT 24  ALKPHOS 46  BILITOT 1.1  PROT 6.0*  ALBUMIN 3.4*   No results for input(s): LIPASE, AMYLASE in the last 168 hours. No results for input(s): AMMONIA in the last 168 hours. Coagulation Profile: No results for input(s): INR, PROTIME in the last 168 hours. Cardiac Enzymes: No results for input(s): CKTOTAL, CKMB,  CKMBINDEX, TROPONINI in the last 168 hours. BNP (last 3 results) No results for input(s): PROBNP in the last 8760 hours. HbA1C: Recent Labs    12/19/18 1003  HGBA1C 8.0*   CBG: Recent Labs  Lab 12/19/18 0907 12/19/18 1332  GLUCAP 166* 141*   Lipid Profile: No results for input(s): CHOL, HDL, LDLCALC, TRIG, CHOLHDL, LDLDIRECT in the last 72 hours. Thyroid Function Tests: No results for input(s): TSH, T4TOTAL, FREET4, T3FREE, THYROIDAB in the last 72 hours. Anemia Panel: No results for input(s): VITAMINB12, FOLATE, FERRITIN, TIBC, IRON, RETICCTPCT in the last 72 hours.  --------------------------------------------------------------------------------------------------------------- Urine analysis:    Component Value Date/Time   COLORURINE YELLOW 12/20/2018 0600   APPEARANCEUR CLEAR 12/20/2018 0600   LABSPEC 1.011 12/20/2018 0600   PHURINE 5.0 12/20/2018 0600   GLUCOSEU NEGATIVE 12/20/2018 0600   HGBUR NEGATIVE 12/20/2018 0600   BILIRUBINUR NEGATIVE 12/20/2018 0600   KETONESUR NEGATIVE 12/20/2018 0600   PROTEINUR NEGATIVE 12/20/2018 0600   NITRITE NEGATIVE 12/20/2018 0600   LEUKOCYTESUR NEGATIVE 12/20/2018 0600      Imaging Results:    Dg Ankle Complete Left  Result Date: 12/19/2018 CLINICAL DATA:  Removal of old hardware, placement of new plate and screws on left fibula EXAM: LEFT ANKLE COMPLETE - 3+ VIEW; DG C-ARM 61-120 MIN COMPARISON:  None. FINDINGS: Intraoperative fluoroscopic spot images are provided. Interval removal of the lateral fibular sideplate with replacement with another distal lateral fibular sideplate and multiple interlocking screws. Two syndesmotic screws are present. Normal alignment. IMPRESSION: Intraoperative localization. Electronically Signed   By: Kathreen Devoid   On: 12/19/2018 15:32   Dg Chest Port 1 View  Result Date: 12/20/2018 CLINICAL DATA:  Fever and confusion. EXAM: PORTABLE CHEST 1 VIEW COMPARISON:  CT 10/27/2018.  Portable chest  10/27/2018. FINDINGS: PowerPort catheter with tip over superior vena cava in stable position. Heart size normal. Low lung volumes. Mild left base atelectasis/infiltrate. No pleural effusion or pneumothorax. Prior cervical spine fusion. IMPRESSION: 1.  PowerPort catheter in stable position. 2.  Low lung volumes.  Mild left base atelectasis/infiltrate. Electronically Signed   By: Marcello Moores  Register   On: 12/20/2018 06:08   Dg C-arm 1-60 Min  Result Date: 12/19/2018 CLINICAL DATA:  Removal of old hardware, placement of new plate and screws on left fibula EXAM: LEFT ANKLE COMPLETE - 3+ VIEW; DG C-ARM 61-120 MIN COMPARISON:  None. FINDINGS: Intraoperative fluoroscopic spot images are provided. Interval removal of the lateral fibular sideplate with replacement with another distal lateral fibular sideplate and multiple interlocking screws. Two syndesmotic screws are present. Normal alignment. IMPRESSION: Intraoperative localization. Electronically Signed   By: Kathreen Devoid   On: 12/19/2018 15:32       Assessment & Plan:    Principal Problem:   Fever Active Problems:   Chronic lymphoid leukemia (HCC)   Type 2 diabetes mellitus without complications ([place])   Essential (primary) hypertension   Hyperlipidemia  Fever ? Hcap,  Awaiting urinalysis Awaiting covid -19 Please consult orthopedics to evaluate ankle/ foot  Hcap Blood culture x2 Urine strep antigen Urine legionella antigen vanco iv, cefepime iv  Sepsis (fever, tachycardia,  Blood culture x2 abx as above  DM2 Cont Metformin  fsbs ac and qhs, ISS  Hypertension, Hyperlipidemia, CAD Cont Aspirin Cont Atenolol Cont Hydrochlorothiazide Cont Lisinopril Cont Lipitor  BPH Cont Flomax  Arthritis Cont Meloxicam   DVT Prophylaxis-   Lovenox - SCDs  AM Labs Ordered, also please review Full Orders  Family Communication: Admission, patients condition and plan of care including tests being ordered have been discussed with the patient   who indicate understanding and agree with the plan and Code Status.  Code Status:  FULL CODE  Admission status: Observation: Based on patients clinical presentation and evaluation of above clinical data, I have made determination that patient meets observation criteria at this time.  Time spent in minutes : 70   Jani Gravel M.D on 12/20/2018 at 7:13 AM

## 2018-12-20 NOTE — Progress Notes (Signed)
PROGRESS NOTE  Ronald Rice NKN:[id number] DOB: Mar 19, 1946 DOA: 12/20/2018 PCP: Deland Pretty, MD   LOS: 0 days   Brief narrative: Patient is a 73 year old African-American male with history of hypertension, diabetes mellitus, CAD, GERD, diffuse large cell lymphoma, OSA who had right ankle ORIF revision on 7/28 and had a postop fever at home and is brought to ED earlier this morning.  In the ED, temperature 98.9, heart rate 103, breathing comfortably on room air. WBC 9.6, hemoglobin 10.9, platelet 212 Sodium 136, creatinine 1.17  Urinalysis normal CT Angio of chest was negative for PE or thoracic aortic dissection and showed an overall decrease in left supraclavicular and mediastinal adenopathy. COVID-19 negative Blood culture x2 -was apparently sent from [facility] ED.  Patient was admitted to hospitalist service. Currently on IV cefepime and IV vancomycin.   Subjective: Patient was seen and examined this afternoon.  Pleasant elderly African-American male, lying down in bed.  Not in distress.  Sleepy.  Opens eyes to have a short conversation and falls back asleep again.  Oriented to place and person, not oriented to time. Chart reviewed. No fever since presentation, tachycardic to low 100s, blood pressure mostly in normal range, breathing comfortably on room air. Hemoglobin A1c 8   Assessment/Plan:  Principal Problem:   Fever Active Problems:   Chronic lymphoid leukemia (HCC)   Type 2 diabetes mellitus without complications (HCC)   Essential (primary) hypertension   Hyperlipidemia  Fever -Fever at home. -Unclear source.  CT Angio of chest did not suggest any consolidation.  Urinalysis normal.  COVID-19 test negative. -Orthopedics evaluated right ankle surgical site.  No evidence of infection there. -Currently on broad-spectrum antibiotic coverage with IV cefepime and vancomycin which I will continue. -Follow-up blood culture report. -Since no documented fever and no source  of infection, patient does not meet criteria for sepsis on admission.  Acute delirium  -Likely precipitated by fever.  Per patient's wife, patient tends to have confusion intermittently.  DM2 -Hemoglobin A1c 8. -Prior to admission, patient was on metformin.  I will keep it on hold because of the use of contrast dye. -Start sliding scale insulin with Accu-Cheks. -Modified carb diet  Cardiovascular issues: hypertension, Hyperlipidemia, CAD PTA, patient was on aspirin, statin, atenolol, HCTZ, lisinopril.  Continue all.  BPH Cont Flomax  Arthritis - keep Meloxicam on hold.  Body mass index is 35.95 kg/m. Mobility: Encourage ambulation.  PT evaluation ordered.   Diet: Cardiac/diabetic diet DVT prophylaxis:  Lovenox Code Status:   Code Status: Full Code  Family Communication:  Spoke with patient's wife this afternoon. Expected Discharge:  Anticipate discharge in next 2 to 3 days  Consultants:    Procedures:    Antimicrobials:  Anti-infectives (From admission, onward)   Start     Dose/Rate Route Frequency Ordered Stop   12/20/18 2000  vancomycin (VANCOCIN) IVPB 1000 mg/200 mL premix     1,000 mg 200 mL/hr over 60 Minutes Intravenous Every 12 hours 12/20/18 1205     12/20/18 1600  ceFEPIme (MAXIPIME) 2 g in sodium chloride 0.9 % 100 mL IVPB     2 g 200 mL/hr over 30 Minutes Intravenous Every 8 hours 12/20/18 0723     12/20/18 0730  ceFEPIme (MAXIPIME) 2 g in sodium chloride 0.9 % 100 mL IVPB     2 g 200 mL/hr over 30 Minutes Intravenous  Once 12/20/18 0716 12/20/18 0828   12/20/18 0730  vancomycin (VANCOCIN) 2,500 mg in sodium chloride 0.9 % 500 mL IVPB  Status:  Discontinued     2,500 mg 250 mL/hr over 120 Minutes Intravenous Every 24 hours 12/20/18 0719 12/20/18 1205      Infusions:  . sodium chloride 75 mL/hr at 12/20/18 0622  . ceFEPime (MAXIPIME) IV    . vancomycin      Scheduled Meds: . allopurinol  300 mg Oral Daily  . aspirin  325 mg Oral Daily  .  atenolol  25 mg Oral Daily  . atorvastatin  40 mg Oral Daily  . enoxaparin (LOVENOX) injection  40 mg Subcutaneous Q24H  . hydrochlorothiazide  25 mg Oral Daily  . lisinopril  40 mg Oral Daily  . loratadine  10 mg Oral Daily  . metFORMIN  500 mg Oral QPM  . potassium chloride  10 mEq Oral Daily  . tamsulosin  0.4 mg Oral Daily    PRN meds: acetaminophen **OR** acetaminophen, diphenhydrAMINE, famotidine, LORazepam, prochlorperazine, tiZANidine   Objective: Vitals:   12/20/18 1100 12/20/18 1144  BP: 138/80 (!) 152/92  Pulse:    Resp: 20   Temp:    SpO2: 99%     Intake/Output Summary (Last 24 hours) at 12/20/2018 1359 Last data filed at 12/20/2018 0900 Gross per 24 hour  Intake 460 ml  Output 800 ml  Net -340 ml   Filed Weights   12/20/18 0243  Weight: 127 kg   Weight change:  Body mass index is 35.95 kg/m.   Physical Exam: General exam: Appears calm and comfortable.  Skin: No rashes, lesions or ulcers. HEENT: Atraumatic, normocephalic, supple neck, no obvious bleeding Lungs: Clear to auscultation bilaterally CVS: Regular rate and rhythm, no murmur GI/Abd soft, nontender, nondistended, bowel sound present CNS: Drowsy, opens eyes on verbal command, answer simple questions, falls back to sleep quickly.  Oriented to place and person, not to time Psychiatry: Mood appropriate Extremities: Trace pedal edema on the right leg, left leg with bandage  Data Review: I have personally reviewed the laboratory data and studies available.  Recent Labs  Lab 12/19/18 0858 12/20/18 0252  WBC 6.7 9.6  NEUTROABS  --  6.9  HGB 12.4* 10.9*  HCT 34.7* 30.1*  MCV 86.8 86.2  PLT 230 212   Recent Labs  Lab 12/19/18 0858 12/20/18 0252  NA 138 136  K 4.4 3.8  CL 100 101  CO2 27 24  GLUCOSE 173* 192*  BUN 8 9  CREATININE 1.11 1.17  CALCIUM 9.2 8.1*    Terrilee Croak, MD  [facility] 12/20/2018

## 2018-12-20 NOTE — Progress Notes (Signed)
Orthopedic Tech Progress Note Patient Details:  Ronald Rice Aug 21, 1945 [id number]  Ortho Devices Type of Ortho Device: Stirrup splint, Post (short leg) splint Ortho Device/Splint Location: LLE Ortho Device/Splint Interventions: Adjustment, Application, Ordered   Post Interventions Patient Tolerated: Well Instructions Provided: Care of device, Adjustment of device   Janit Pagan 12/20/2018, 5:10 PM

## 2018-12-20 NOTE — ED Notes (Signed)
Pt has breakfast tray at bedside.  

## 2018-12-20 NOTE — ED Notes (Signed)
Spoke with wife of pt, gave update.

## 2018-12-20 NOTE — ED Notes (Signed)
Lunch Tray Ordered @ 1101-per RN called by Jordy Hewins  

## 2018-12-20 NOTE — Progress Notes (Signed)
Orthopedic Tech Progress Note Patient Details:  Loraine Freid 02-Sep-1945 [id number]  Ortho Devices Type of Ortho Device: Post (short leg) splint Ortho Device/Splint Location: lle. applied at drs request with extra padding. Ortho Device/Splint Interventions: Ordered, Application, Adjustment   Post Interventions Patient Tolerated: Well Instructions Provided: Care of device, Adjustment of device   Karolee Stamps 12/20/2018, 6:30 AM

## 2018-12-20 NOTE — Progress Notes (Signed)
Pharmacy Antibiotic Note  Ronald Rice is a 73 y.o. male admitted on 12/20/2018 with pneumonia.  Pharmacy has been consulted for vancomycin and cefepime dosing.  Plan: Cefepime 2gm IV q8 hours Vancomycin 2500 mg IV q24 hours F/u renal function, cultures and clinical course  Height: 6\' 2"  (188 cm) Weight: 280 lb (127 kg) IBW/kg (Calculated) : 82.2  Temp (24hrs), Avg:98.4 F (36.9 C), Min:98 F (36.7 C), Max:98.9 F (37.2 C)  Recent Labs  Lab 12/19/18 0858 12/20/18 0252  WBC 6.7 9.6  CREATININE 1.11 1.17    Estimated Creatinine Clearance: 82 mL/min (by C-G formula based on SCr of 1.17 mg/dL).    No Known Allergies   Thank you for allowing pharmacy to be a part of this patient's care.  Excell Seltzer Poteet 12/20/2018 7:19 AM

## 2018-12-20 NOTE — ED Triage Notes (Signed)
Pt transferred from [facility].  Had surgery on his left ankle on Tuesday at [facility], found to have a fever and confusion (memory loss) at home and was taken to [facility].   At [facility] pt was given zosyn, vanc, two liters of NS.  Positive lactic of 2.53.

## 2018-12-20 NOTE — Progress Notes (Signed)
Ronald Rice is a 73 y.o. male   Orthopaedic diagnosis: Postop day 1 status post left revision ankle ORIF and removal of hardware.  Patient was discharged from the PACU yesterday and then presented to the emergency room with fever and increased pain in his operative leg.  He was placed in a splint postoperatively.  The splint was removed by the emergency department staff and the wound did not show any evidence of infection.  Chest x-ray was negative in the emergency department and blood cultures were obtained.  He was sent to [facility] emergency department for further work-up and admission for fever of unknown origin.  Orthopedics was called as he is 1 day out from surgery.  Patient has been afebrile since being transferred to [facility].  Patient states he became febrile and had some increasing pain in his operative extremity.  Patient did have a block during surgery.  Patient does have known bilateral lower extremity neuropathy and uncontrolled diabetes.  Since arriving to the emergency department the pain in his leg has persisted but has not worsened.  He denies any systemic symptoms.  A new splint was placed by the [facility].    Objectyive: Vitals:   12/20/18 1100 12/20/18 1144  BP: 138/80 (!) 152/92  Pulse:    Resp: 20   Temp:    SpO2: 99%      Exam: Awake and alert Respirations even and unlabored No acute distress  Left lower extremity in a splint.  Is a posterior slab splint.  Toes exposed are warm and well-perfused.  Is able to wiggle toes.  No tenderness to palpation proximal to the splint.  No pain to palpation about the knee.  No pain with logroll of the hip.  No evidence of right lower extremity injury or discomfort.  Repeat coronavirus test was negative.   Assessment: Patient is postoperative day 1 status post revision ORIF of left ankle and removal of hardware with fever after being discharged from the postoperative recovery room.   Plan: Patient is 1 day out from  surgery and therefore the chances of this being a postoperative infection are very low.  There were no signs of infection at the time of surgery.  Incision site looked good upon splint removal and the amount of swelling was appropriate.  The patient's increase in pain coincides with the peripheral nerve block wearing off and therefore is appropriate and expected pain.  The patient's pain has not increased since last night.  I will have the [place] place the patient in a more robust splint as he has an unstable ankle status post fixation.  Patient will complete work-up by the internal medicine team and be treated accordingly.  We will plan to see the patient back in the office for his scheduled postoperative follow-up in 2 weeks.  He will call my office with any concerns.  He will maintain nonweightbearing to the left lower extremity.   Radene Journey, MD

## 2018-12-20 NOTE — ED Notes (Signed)
Tele breakfast ordered  

## 2018-12-20 NOTE — ED Provider Notes (Signed)
[facility] EMERGENCY DEPARTMENT Provider Note   CSN: [id number] Arrival date & time: 12/20/18  0227     History   Chief Complaint Chief Complaint  Patient presents with  . Fever  . Post-op Problem    HPI Makayla Confer is a 73 y.o. male.     The history is provided by the patient.  Fever Severity:  Moderate Onset quality:  Sudden Timing:  Constant Progression:  Improving Chronicity:  New Worsened by:  Nothing Associated symptoms: no chest pain, no cough, no headaches and no vomiting   Associated symptoms comment:  Arthritis Risk factors comment:  Recent surgery pt with h/o CLL, CAD, diabetes presents with fever and left ankle pain Pt just underwent revision ORIF, removal of hardware of left ankle on 7/28 by Dr. Lucia Gaskins.   After being discharged, he developed fever and increased pain in left ankle He was seen at [facility], he was noted to be febrile, start on antibiotics and transferred after discussion with orthopedist    Past Medical History:  Diagnosis Date  . Cancer ([place])    Leukemia   . CLL (chronic lymphocytic leukemia) ([place])    see records in care everywhere from [place]  . Coronary artery disease    minimal nonobstructive by 10/31/16 cath at [facility] in [place]  . Diabetes mellitus without complication ([place])   . Diffuse large B-cell lymphoma of lymph nodes of multiple regions ([place]) 10/31/2018  . Fracture    left ankle fracture with retained hardware  . GERD (gastroesophageal reflux disease)   . Goals of care, counseling/discussion 10/31/2018  . Hypertension   . Sleep apnea    does not wear CPAP  . Wears dentures   . Wears glasses     Patient Active Problem List   Diagnosis Date Noted  . Complex sleep apnea syndrome 12/18/2018  . Goals of care, counseling/discussion 10/31/2018  . Diffuse large B-cell lymphoma of lymph nodes of multiple regions ([place]) 10/31/2018  . Cellulitis of left leg 10/27/2018  . Displaced fracture of  lateral malleolus of left fibula 09/13/2018  . Dream enactment behavior 07/26/2018  . Snoring 07/26/2018  . Hypersomnia with sleep apnea 07/26/2018  . Fever   . Acute cystitis with hematuria   . Ischemic stroke ([place]) 11/22/2017  . Right hip pain 07/18/2017  . Cerebellar ataxia in diseases classified elsewhere ([place]) 07/18/2017  . Radiculopathy 04/20/2017  . Chronic lymphoid leukemia ([place]) 01/25/2017  . CAD (coronary artery disease) 10/31/2016  . Influenza A 05/24/2016  . Acute bronchitis 05/22/2016  . Weakness 05/22/2016  . Sepsis ([place]) 05/22/2016  . Pedal edema 05/22/2016  . Gait abnormality 07/28/2015  . Nonspecific elevation of levels of transaminase and lactic acid dehydrogenase (LDH) 03/02/2015  . Pseudarthrosis after fusion or arthrodesis 06/03/2014  . Fusion of spine of cervical region 06/05/2013  . Fusion of lumbar spine 03/07/2012  . Benign prostatic hyperplasia 06/15/2011  . Liver cyst 07/29/2009  . Kidney cyst, acquired 06/17/2009  . Type 2 diabetes mellitus without complications ([place]) 30/16/0109  . Diverticulosis of colon 08/03/2007  . Hyperlipidemia 03/14/2007  . Neuropathy, peripheral 12/03/2005  . Allergic rhinitis 03/01/2000  . Essential (primary) hypertension 10/03/1996  . GERD (gastroesophageal reflux disease) 07/02/1995    Past Surgical History:  Procedure Laterality Date  . ANTERIOR CERVICAL DECOMP/DISCECTOMY FUSION N/A 04/20/2017   Procedure: ANTERIOR CERVICAL DECOMPRESSION FUSION, CERVICAL 3-4, CERVICAL 4-5 WITH INSTRUMENTATION AND ALLOGRAFT; REQUEST 3.5 HOURS;  Surgeon: Phylliss Bob, MD;  Location: [facility];  Service: Orthopedics;  Laterality: N/A;  ANTERIOR CERVICAL DECOMPRESSION FUSION, CERVICAL 3-4, CERVICAL 4-5 WITH INSTRUMENTATION AND ALLOGRAFT; REQUEST 3.5 HOURS  . BACK SURGERY    . CARDIAC CATHETERIZATION     in Care Everywhere 11/01/16  . COLONOSCOPY    . IR IMAGING GUIDED PORT INSERTION  11/10/2018  . MULTIPLE TOOTH EXTRACTIONS    . ORIF ANKLE  FRACTURE Left 09/13/2018   Procedure: OPEN REDUCTION INTERNAL FIXATION (ORIF) LATERAL LEFT ANKLE FRACTURE;  Surgeon: Dorna Leitz, MD;  Location: [facility];  Service: Orthopedics;  Laterality: Left;  . SYNDESMOSIS REPAIR Left 09/13/2018   Procedure: OPEN REDUCTION INTERNAL FIXATION SYNDESMOSIS REPAIR LEFT ANKLE;  Surgeon: Dorna Leitz, MD;  Location: [facility];  Service: Orthopedics;  Laterality: Left;        Home Medications    Prior to Admission medications   Medication Sig Start Date End Date Taking? Authorizing Provider  acetaminophen (TYLENOL) 500 MG tablet Take 1,000 mg by mouth every 6 (six) hours as needed (pain).     [provider]  allopurinol (ZYLOPRIM) 300 MG tablet Take 300 mg by mouth daily.    [provider]  aspirin (BAYER ASPIRIN) 325 MG tablet Take 1 tablet (325 mg total) by mouth daily. 12/19/18 12/19/19  Erle Crocker, MD  aspirin EC 81 MG tablet Take 81 mg by mouth daily.    [provider]  atenolol (TENORMIN) 25 MG tablet Take 25 mg by mouth daily.     [provider]  atorvastatin (LIPITOR) 40 MG tablet Take 40 mg by mouth daily.    [provider]  CIALIS 20 MG tablet Take 20 mg by mouth as needed for erectile dysfunction. 10/06/17   [provider]  CINNAMON PO Take 1 tablet by mouth daily.    [provider]  diclofenac sodium (VOLTAREN) 1 % GEL Apply 1 application topically as needed (pain).  10/10/18   [provider]  diphenhydrAMINE (SIMPLY SLEEP) 25 MG tablet Take 25 mg by mouth at bedtime as needed for sleep.    [provider]  famciclovir (FAMVIR) 500 MG tablet Take 1 tablet (500 mg total) by mouth daily. Patient not taking: Reported on 11/29/2018 11/01/18   Volanda Napoleon, MD  famotidine (PEPCID) 20 MG tablet Take 20 mg by mouth at bedtime as needed for heartburn.     [provider]  hydrochlorothiazide (HYDRODIURIL) 25 MG tablet Take 25 mg by mouth daily.  12/01/18    [provider]  HYDROcodone-acetaminophen (NORCO/VICODIN) 5-325 MG tablet Take 1-2 tablets by mouth every 6 (six) hours as needed for moderate pain. Patient not taking: Reported on 12/14/2018 09/13/18   Gary Fleet, PA-C  lidocaine-prilocaine (EMLA) cream Apply to affected area once Patient taking differently: Apply 1 application topically daily as needed (pain). Apply to affected area once 11/02/18   Volanda Napoleon, MD  lisinopril (ZESTRIL) 40 MG tablet Take 1 tablet (40 mg total) by mouth daily for 30 days. 10/29/18 12/14/18  Donne Hazel, MD  loratadine (CLARITIN) 10 MG tablet Take 10 mg by mouth daily.    [provider]  LORazepam (ATIVAN) 0.5 MG tablet Take 1 tablet (0.5 mg total) by mouth every 6 (six) hours as needed (Nausea or vomiting). 11/02/18   Volanda Napoleon, MD  meloxicam (MOBIC) 15 MG tablet Take 15 mg by mouth daily as needed for pain.  11/21/18   [provider]  metFORMIN (GLUCOPHAGE-XR) 500 MG 24 hr tablet Take 500 mg by  mouth every evening.  11/28/18   [provider]  NON FORMULARY Take 2 tablets by mouth daily. Blood Sugar Defense     [provider]  ondansetron (ZOFRAN) 8 MG tablet Take 1 tablet (8 mg total) by mouth 2 (two) times daily as needed for refractory nausea / vomiting. Start on day 3 after cyclophosphamide chemotherapy. 11/02/18   Volanda Napoleon, MD  oxyCODONE (ROXICODONE) 5 MG immediate release tablet Take 1 tablet (5 mg total) by mouth every 4 (four) hours as needed for severe pain. 12/13/18   Maudie Flakes, MD  potassium chloride (K-DUR) 10 MEQ tablet Take 10 mEq by mouth daily.     [provider]  predniSONE (DELTASONE) 20 MG tablet Take 3 tablets (60 mg total) by mouth daily. Take on days 1-5 of chemotherapy. 11/02/18   Volanda Napoleon, MD  prochlorperazine (COMPAZINE) 10 MG tablet Take 1 tablet (10 mg total) by mouth every 6 (six) hours as needed (Nausea or vomiting). 11/02/18   Volanda Napoleon, MD   tamsulosin (FLOMAX) 0.4 MG CAPS capsule Take 0.4 mg by mouth daily.    [provider]  tiZANidine (ZANAFLEX) 4 MG tablet Take 1 tablet (4 mg total) by mouth every 12 (twelve) hours as needed for muscle spasms. 10/28/18   Donne Hazel, MD  traMADol (ULTRAM) 50 MG tablet Take 1 tablet (50 mg total) by mouth every 6 (six) hours as needed. Patient taking differently: Take 50 mg by mouth every 6 (six) hours as needed for moderate pain.  10/30/18   Volanda Napoleon, MD    Family History Family History  Problem Relation Age of Onset  . Heart attack Mother   . Diabetes Sister   . Diabetes Brother     Social History Social History   Tobacco Use  . Smoking status: Former Smoker    Types: Pipe, Cigars, Cigarettes    Quit date: 10/31/1989    Years since quitting: 29.1  . Smokeless tobacco: Never Used  Substance Use Topics  . Alcohol use: Yes    Frequency: Never    Comment: occasional  . Drug use: No     Allergies   Patient has no known allergies.   Review of Systems Review of Systems  Constitutional: Positive for fever.  Respiratory: Negative for cough.   Cardiovascular: Negative for chest pain.  Gastrointestinal: Negative for vomiting.  Musculoskeletal: Positive for arthralgias.  Neurological: Negative for headaches.  All other systems reviewed and are negative.    Physical Exam Updated Vital Signs BP 137/78 (BP Location: Right Arm)   Pulse (!) 103   Temp 98.9 F (37.2 C) (Oral)   Resp 16   Ht 1.88 m (6\' 2" )   Wt 127 kg   SpO2 95%   BMI 35.95 kg/m   Physical Exam  CONSTITUTIONAL: Well developed/well nourished HEAD: Normocephalic/atraumatic EYES: EOMI  ENMT: Mucous membranes moist NECK: supple no meningeal signs SPINE/BACK:entire spine nontender CV: S1/S2 noted, no murmurs/rubs/gallops noted LUNGS: Lungs are clear to auscultation bilaterally, no apparent distress ABDOMEN: soft, nontender, no rebound or guarding, bowel sounds noted throughout abdomen,  obese GU:no cva tenderness NEURO: Pt is awake/alert/appropriate, moves all extremitiesx4.  No facial droop.   EXTREMITIES: pulses normal/equal, full ROM, arrives in splint in place to left LE.   SKIN: warm, color normal PSYCH: no abnormalities of mood noted, alert and oriented to situation    Patient gave verbal permission to utilize photo for medical documentation only The image was  not stored on any personal device   ED Treatments / Results  Labs (all labs ordered are listed, but only abnormal results are displayed) Labs Reviewed  BASIC METABOLIC PANEL - Abnormal; Notable for the following components:      Result Value   Glucose, Bld 192 (*)    Calcium 8.1 (*)    All other components within normal limits  CBC WITH DIFFERENTIAL/PLATELET - Abnormal; Notable for the following components:   RBC 3.49 (*)    Hemoglobin 10.9 (*)    HCT 30.1 (*)    MCHC 36.2 (*)    Monocytes Absolute 1.7 (*)    All other components within normal limits  SARS CORONAVIRUS 2 (HOSPITAL ORDER, [facility])  URINALYSIS, ROUTINE W REFLEX MICROSCOPIC  HEPATIC FUNCTION PANEL  D-DIMER, QUANTITATIVE (NOT AT [facility]  TROPONIN I (HIGH SENSITIVITY)    EKG None  Radiology Dg Ankle Complete Left  Result Date: 12/19/2018 CLINICAL DATA:  Removal of old hardware, placement of new plate and screws on left fibula EXAM: LEFT ANKLE COMPLETE - 3+ VIEW; DG C-ARM 61-120 MIN COMPARISON:  None. FINDINGS: Intraoperative fluoroscopic spot images are provided. Interval removal of the lateral fibular sideplate with replacement with another distal lateral fibular sideplate and multiple interlocking screws. Two syndesmotic screws are present. Normal alignment. IMPRESSION: Intraoperative localization. Electronically Signed   By: Kathreen Devoid   On: 12/19/2018 15:32   Dg C-arm 1-60 Min  Result Date: 12/19/2018 CLINICAL DATA:  Removal of old hardware, placement of new plate and screws on left fibula EXAM:  LEFT ANKLE COMPLETE - 3+ VIEW; DG C-ARM 61-120 MIN COMPARISON:  None. FINDINGS: Intraoperative fluoroscopic spot images are provided. Interval removal of the lateral fibular sideplate with replacement with another distal lateral fibular sideplate and multiple interlocking screws. Two syndesmotic screws are present. Normal alignment. IMPRESSION: Intraoperative localization. Electronically Signed   By: Kathreen Devoid   On: 12/19/2018 15:32    Procedures Procedures   SPLINT APPLICATION Date/Time: 2:77 AM Authorized by: Sharyon Cable Consent: Verbal consent obtained. Risks and benefits: risks, benefits and alternatives were discussed Consent given by: patient Splint applied by: orthopedic technician Location details: left Leg Splint type: posterior Supplies used: ortho glass Post-procedure: The splinted body part was neurovascularly unchanged following the procedure. Patient tolerance: Patient tolerated the procedure well with no immediate complications.    Medications Ordered in ED Medications - No data to display   Initial Impression / Assessment and Plan / ED Course  I have reviewed the triage vital signs and the nursing notes.  Pertinent labs & imaging results that were available during my care of the patient were reviewed by me and considered in my medical decision making (see chart for details).        2:58 AM Patient seen in outside facility and had negative chest x-ray.  Mildly elevated lactate, he was found to be febrile.  He was placed on vancomycin and Zosyn.  Will consult his surgeon. We will also add on  Urinalysis. Patient is hemodynamically appropriate. Splint was removed to evaluate wound 3:20 AM D/w dr Rhona Raider It would be unusual to have acute infection this early on after surgery Plan to admit to medicine with ortho consulting 4:09 AM Left LE was re-splinted  Patient is awake alert denies any other complaints except pain in his groin.  On examination, he  has mild erythema to scrotum, no edema, no crepitus, no abscess. No perineal tenderness  Denies any rectal pain.  No recent vomiting or diarrhea.  No abdominal pain  I spoke to wife via phone.  She reports that he has actually had episodes of confusion/memory loss that started with chemotherapy for his CLL.  It is possible this was accentuated by the fever. 5:36 AM Discussed with Dr. Maudie Mercury for admission.  Patient will be admitted for monitoring, blood cultures are pending at [facility] will see patient later today.  At time of admission, urinalysis and COVID screen is pending CXR already negative at [facility] Final Clinical Impressions(s) / ED Diagnoses   Final diagnoses:  Acute febrile illness  Postoperative pain    ED Discharge Orders    None       Ripley Fraise, MD 12/20/18 [phone]

## 2018-12-21 ENCOUNTER — Ambulatory Visit: Payer: Self-pay | Admitting: *Deleted

## 2018-12-21 ENCOUNTER — Observation Stay (HOSPITAL_COMMUNITY): Payer: Medicare HMO

## 2018-12-21 ENCOUNTER — Inpatient Hospital Stay (HOSPITAL_COMMUNITY): Payer: Medicare HMO

## 2018-12-21 DIAGNOSIS — R1012 Left upper quadrant pain: Secondary | ICD-10-CM | POA: Diagnosis present

## 2018-12-21 DIAGNOSIS — I2510 Atherosclerotic heart disease of native coronary artery without angina pectoris: Secondary | ICD-10-CM | POA: Diagnosis present

## 2018-12-21 DIAGNOSIS — Z79899 Other long term (current) drug therapy: Secondary | ICD-10-CM | POA: Diagnosis not present

## 2018-12-21 DIAGNOSIS — Z6835 Body mass index (BMI) 35.0-35.9, adult: Secondary | ICD-10-CM | POA: Diagnosis not present

## 2018-12-21 DIAGNOSIS — R410 Disorientation, unspecified: Secondary | ICD-10-CM | POA: Diagnosis present

## 2018-12-21 DIAGNOSIS — R509 Fever, unspecified: Secondary | ICD-10-CM | POA: Diagnosis present

## 2018-12-21 DIAGNOSIS — G4733 Obstructive sleep apnea (adult) (pediatric): Secondary | ICD-10-CM | POA: Diagnosis present

## 2018-12-21 DIAGNOSIS — N281 Cyst of kidney, acquired: Secondary | ICD-10-CM | POA: Diagnosis present

## 2018-12-21 DIAGNOSIS — Z87891 Personal history of nicotine dependence: Secondary | ICD-10-CM | POA: Diagnosis not present

## 2018-12-21 DIAGNOSIS — R5082 Postprocedural fever: Secondary | ICD-10-CM | POA: Diagnosis present

## 2018-12-21 DIAGNOSIS — Z7982 Long term (current) use of aspirin: Secondary | ICD-10-CM | POA: Diagnosis not present

## 2018-12-21 DIAGNOSIS — E1140 Type 2 diabetes mellitus with diabetic neuropathy, unspecified: Secondary | ICD-10-CM | POA: Diagnosis present

## 2018-12-21 DIAGNOSIS — S93492D Sprain of other ligament of left ankle, subsequent encounter: Secondary | ICD-10-CM | POA: Diagnosis not present

## 2018-12-21 DIAGNOSIS — T84098A Other mechanical complication of other internal joint prosthesis, initial encounter: Secondary | ICD-10-CM | POA: Diagnosis present

## 2018-12-21 DIAGNOSIS — M1990 Unspecified osteoarthritis, unspecified site: Secondary | ICD-10-CM | POA: Diagnosis present

## 2018-12-21 DIAGNOSIS — I10 Essential (primary) hypertension: Secondary | ICD-10-CM | POA: Diagnosis present

## 2018-12-21 DIAGNOSIS — Z981 Arthrodesis status: Secondary | ICD-10-CM | POA: Diagnosis not present

## 2018-12-21 DIAGNOSIS — E785 Hyperlipidemia, unspecified: Secondary | ICD-10-CM | POA: Diagnosis present

## 2018-12-21 DIAGNOSIS — Z20828 Contact with and (suspected) exposure to other viral communicable diseases: Secondary | ICD-10-CM | POA: Diagnosis present

## 2018-12-21 DIAGNOSIS — N400 Enlarged prostate without lower urinary tract symptoms: Secondary | ICD-10-CM | POA: Diagnosis present

## 2018-12-21 DIAGNOSIS — E669 Obesity, unspecified: Secondary | ICD-10-CM | POA: Diagnosis present

## 2018-12-21 DIAGNOSIS — S82832G Other fracture of upper and lower end of left fibula, subsequent encounter for closed fracture with delayed healing: Secondary | ICD-10-CM | POA: Diagnosis not present

## 2018-12-21 DIAGNOSIS — K219 Gastro-esophageal reflux disease without esophagitis: Secondary | ICD-10-CM | POA: Diagnosis present

## 2018-12-21 DIAGNOSIS — C9110 Chronic lymphocytic leukemia of B-cell type not having achieved remission: Secondary | ICD-10-CM | POA: Diagnosis present

## 2018-12-21 DIAGNOSIS — E1165 Type 2 diabetes mellitus with hyperglycemia: Secondary | ICD-10-CM | POA: Diagnosis present

## 2018-12-21 LAB — CBC
HCT: 29.6 % — ABNORMAL LOW (ref 39.0–52.0)
Hemoglobin: 10.7 g/dL — ABNORMAL LOW (ref 13.0–17.0)
MCH: 31 pg (ref 26.0–34.0)
MCHC: 36.1 g/dL — ABNORMAL HIGH (ref 30.0–36.0)
MCV: 85.8 fL (ref 80.0–100.0)
Platelets: 213 10*3/uL (ref 150–400)
RBC: 3.45 MIL/uL — ABNORMAL LOW (ref 4.22–5.81)
RDW: 13.5 % (ref 11.5–15.5)
WBC: 7.1 10*3/uL (ref 4.0–10.5)
nRBC: 0 % (ref 0.0–0.2)

## 2018-12-21 LAB — COMPREHENSIVE METABOLIC PANEL
ALT: 20 U/L (ref 0–44)
AST: 20 U/L (ref 15–41)
Albumin: 3.1 g/dL — ABNORMAL LOW (ref 3.5–5.0)
Alkaline Phosphatase: 47 U/L (ref 38–126)
Anion gap: 10 (ref 5–15)
BUN: 7 mg/dL — ABNORMAL LOW (ref 8–23)
CO2: 25 mmol/L (ref 22–32)
Calcium: 8.7 mg/dL — ABNORMAL LOW (ref 8.9–10.3)
Chloride: 102 mmol/L (ref 98–111)
Creatinine, Ser: 0.96 mg/dL (ref 0.61–1.24)
GFR calc Af Amer: >60 mL/min (ref >60)
GFR calc non Af Amer: >60 mL/min (ref >60)
Glucose, Bld: 160 mg/dL — ABNORMAL HIGH (ref 70–99)
Potassium: 3.4 mmol/L — ABNORMAL LOW (ref 3.5–5.1)
Sodium: 137 mmol/L (ref 135–145)
Total Bilirubin: 1.1 mg/dL (ref 0.3–1.2)
Total Protein: 5.6 g/dL — ABNORMAL LOW (ref 6.5–8.1)

## 2018-12-21 LAB — GLUCOSE, CAPILLARY
Glucose-Capillary: 179 mg/dL — ABNORMAL HIGH (ref 70–99)
Glucose-Capillary: 168 mg/dL — ABNORMAL HIGH (ref 70–99)
Glucose-Capillary: 166 mg/dL — ABNORMAL HIGH (ref 70–99)
Glucose-Capillary: 180 mg/dL — ABNORMAL HIGH (ref 70–99)

## 2018-12-21 LAB — LEGIONELLA PNEUMOPHILA SEROGP 1 UR AG: L. pneumophila Serogp 1 Ur Ag: NEGATIVE

## 2018-12-21 LAB — MRSA PCR SCREENING: MRSA by PCR: NEGATIVE

## 2018-12-21 LAB — ECHOCARDIOGRAM COMPLETE
Height: 74 in
Weight: 4480 oz

## 2018-12-21 MED ORDER — ENOXAPARIN SODIUM 80 MG/0.8ML ~~LOC~~ SOLN
65.0000 mg | SUBCUTANEOUS | Status: DC
  Administered 2018-12-22 – 2018-12-25 (×4): 65 mg via SUBCUTANEOUS
  Filled 2018-12-21 (×4): qty 0.65

## 2018-12-21 NOTE — TOC Initial Note (Signed)
Transition of Care [facility]) - Initial/Assessment Note    Patient Details  Name: Ronald Rice MRN: [id number] Date of Birth: 07-08-1945  Transition of Care [facility]) CM/SW Contact:    Ronald Rice, [place] Phone Number: [phone] 12/21/2018, 3:29 PM  Clinical Narrative:                  CSW consulted with patient and wife at bedside regarding PT recommendation of [facility] for short term rehab at time of discharge, patient and wife are agreeable to this plan. They agree for CSW to fax out referrals, with preference for facility in [facility] area. CSW will fax out referrals for bed offers and notify patient and spouse when bed offers are avail.   Expected Discharge Plan: Skilled Nursing Facility Barriers to Discharge: Continued Medical Work up   Patient Goals and CMS Choice   CMS Medicare.gov Compare Post Acute Care list provided to:: Patient Choice offered to / list presented to : Patient  Expected Discharge Plan and Services Expected Discharge Plan: [place] Choice: [place] arrangements for the past 2 months: Single Family Home                                      Prior Living Arrangements/Services Living arrangements for the past 2 months: Single Family Home Lives with:: Spouse Patient language and need for interpreter reviewed:: Yes Do you feel safe going back to the place where you live?: Yes      Need for Family Participation in Patient Care: Yes (Comment) Care giver support system in place?: Yes (comment)   Criminal Activity/Legal Involvement Pertinent to Current Situation/Hospitalization: No - Comment as needed  Activities of Daily Living Home Assistive Devices/Equipment: Wheelchair, Environmental consultant (specify type), Cane (specify quad or straight), Eyeglasses, Grab bars in shower, Shower chair without back ADL Screening (condition at time of admission) Patient's cognitive ability adequate to safely  complete daily activities?: Yes Is the patient deaf or have difficulty hearing?: No Does the patient have difficulty seeing, even when wearing glasses/contacts?: No Does the patient have difficulty concentrating, remembering, or making decisions?: Yes Patient able to express need for assistance with ADLs?: Yes Does the patient have difficulty dressing or bathing?: No Independently performs ADLs?: Yes (appropriate for developmental age) Does the patient have difficulty walking or climbing stairs?: Yes Weakness of Legs: Both Weakness of Arms/Hands: Both  Permission Sought/Granted Permission sought to share information with : Case Manager, Family Supports, Chartered certified accountant granted to share information with : Yes, Verbal Permission Granted  Share Information with NAME: Ronald Rice  Permission granted to share info w AGENCY: SNFs  Permission granted to share info w Relationship: spouse  Permission granted to share info w Contact Information: [phone]  Emotional Assessment Appearance:: Appears stated age Attitude/Demeanor/Rapport: Gracious Affect (typically observed): Calm Orientation: : Oriented to Self, Oriented to Place, Oriented to  Time, Oriented to Situation   Psych Involvement: No (comment)  Admission diagnosis:  Postoperative pain [G89.18] Fever [R50.9] Acute febrile illness [R50.9] Patient Active Problem List   Diagnosis Date Noted  . Complex sleep apnea syndrome 12/18/2018  . Goals of care, counseling/discussion 10/31/2018  . Diffuse large B-cell lymphoma of lymph nodes of multiple regions ([place]) 10/31/2018  . Cellulitis of left leg 10/27/2018  . Displaced fracture of lateral malleolus of left fibula 09/13/2018  . Dream  enactment behavior 07/26/2018  . Snoring 07/26/2018  . Hypersomnia with sleep apnea 07/26/2018  . Fever   . Acute cystitis with hematuria   . Ischemic stroke ([place]) 11/22/2017  . Right hip pain 07/18/2017  . Cerebellar ataxia in  diseases classified elsewhere ([place]) 07/18/2017  . Radiculopathy 04/20/2017  . Chronic lymphoid leukemia ([place]) 01/25/2017  . CAD (coronary artery disease) 10/31/2016  . Influenza A 05/24/2016  . Acute bronchitis 05/22/2016  . Weakness 05/22/2016  . Sepsis ([place]) 05/22/2016  . Pedal edema 05/22/2016  . Gait abnormality 07/28/2015  . Nonspecific elevation of levels of transaminase and lactic acid dehydrogenase (LDH) 03/02/2015  . Pseudarthrosis after fusion or arthrodesis 06/03/2014  . Fusion of spine of cervical region 06/05/2013  . Fusion of lumbar spine 03/07/2012  . Benign prostatic hyperplasia 06/15/2011  . Liver cyst 07/29/2009  . Kidney cyst, acquired 06/17/2009  . Type 2 diabetes mellitus without complications ([place]) 27/51/7001  . Diverticulosis of colon 08/03/2007  . Hyperlipidemia 03/14/2007  . Neuropathy, peripheral 12/03/2005  . Allergic rhinitis 03/01/2000  . Essential (primary) hypertension 10/03/1996  . GERD (gastroesophageal reflux disease) 07/02/1995   PCP:  Deland Pretty, MD Pharmacy:   [id number] - [place], [place] - [address] AT [place] [address] [place] [address] Phone: [phone] Fax: [phone]     Social Determinants of Health (SDOH) Interventions    Readmission Risk Interventions No flowsheet data found.

## 2018-12-21 NOTE — Progress Notes (Signed)
  Echocardiogram 2D Echocardiogram has been performed.  Jannett Celestine 12/21/2018, 11:48 AM

## 2018-12-21 NOTE — Evaluation (Signed)
Physical Therapy Evaluation Patient Details Name: Ronald Rice MRN: [id number] DOB: Apr 06, 1946 Today's Date: 12/21/2018   History of Present Illness  Pt is a 73 y/o male admitted secondary to fever. COVID test was negative. Pt recently had a L ankle ORIF on 12/19/18. PMH including but not limited to CLL on chemo, CAD, DM, HTN and original L ankle ORIF on 09/13/18.    Clinical Impression  Pt presented supine in bed with HOB elevated, awake and willing to participate in therapy session. Pt's wife was present throughout. Prior to admission, pt reported that he was using a RW for transfers and w/c for primary mobility. Wife was assisting with transfers as well as with ADLs. Pt also admitting to having great difficulty maintaining NWB L LE while at home. He and his wife attempted to describe how he has been ascending/descending the four steps to get into their home, which sounded incredibly unsafe. At the time of evaluation, pt required min guard for bed mobility and min A x2 with RW for transfers. He was unable to maintain NWB L LE without physical assistance and constant reminders. Pt would greatly benefit from further skilled physical therapy services at a SNF to improve his overall safety and independence with functional mobility prior to returning home with his wife. PT will continue to follow acutely.    Follow Up Recommendations SNF;Supervision/Assistance - 24 hour    Equipment Recommendations  None recommended by PT    Recommendations for Other Services       Precautions / Restrictions Precautions Precautions: Fall Precaution Comments: hx of falls Restrictions Weight Bearing Restrictions: Yes LLE Weight Bearing: Non weight bearing      Mobility  Bed Mobility Overal bed mobility: Needs Assistance Bed Mobility: Supine to Sit     Supine to sit: Min guard     General bed mobility comments: increased time and effort needed, use of bed rails, cueing for technique  Transfers Overall  transfer level: Needs assistance Equipment used: Rolling walker (2 wheeled) Transfers: Sit to/from [employer] Transfers Sit to Stand: +2 safety/equipment;Min assist;From elevated surface Stand pivot transfers: +2 safety/equipment;Min assist       General transfer comment: increased time and effort, max cueing to maintain NWB L LE throughout and for safe hand placement and technique, pt pivoting from bed to [facility] towards his R side  Ambulation/Gait             General Gait Details: unsafe to attempt at this time secondary to pt's inability to maintain NWB L LE  Stairs            Wheelchair Mobility    Modified Rankin (Stroke Patients Only)       Balance Overall balance assessment: History of Falls;Needs assistance Sitting-balance support: Feet supported Sitting balance-Leahy Scale: Fair     Standing balance support: Bilateral upper extremity supported Standing balance-Leahy Scale: Poor                               Pertinent Vitals/Pain Pain Assessment: No/denies pain    Home Living Family/patient expects to be discharged to:: Private residence Living Arrangements: Spouse/significant other Available Help at Discharge: Family;Available 24 hours/day Type of Home: House Home Access: Stairs to enter Entrance Stairs-Rails: Right Entrance Stairs-Number of Steps: 4 Home Layout: One level Home Equipment: Walker - 2 wheels;Walker - 4 wheels;Bedside commode;Wheelchair - manual      Prior Function Level of Independence: Needs assistance  Gait / Transfers Assistance Needed: requires assistance from wife for transfers using RW, has a w/c primarily for mobility  ADL's / Homemaking Assistance Needed: requires assistance from wife        Hand Dominance        Extremity/Trunk Assessment   Upper Extremity Assessment Upper Extremity Assessment: Overall WFL for tasks assessed    Lower Extremity Assessment Lower Extremity Assessment: LLE  deficits/detail LLE Deficits / Details: lower leg splint in place; pt had great difficulty maintaining NWB status without physical assistance during functional mobility  LLE: Unable to fully assess due to immobilization       Communication   Communication: No difficulties  Cognition Arousal/Alertness: Awake/alert Behavior During Therapy: Anxious;Impulsive Overall Cognitive Status: Impaired/Different from baseline Area of Impairment: Attention;Following commands;Memory;Safety/judgement;Problem solving                   Current Attention Level: Selective Memory: Decreased short-term memory;Decreased recall of precautions Following Commands: Follows one step commands consistently;Follows one step commands with increased time Safety/Judgement: Decreased awareness of safety;Decreased awareness of deficits   Problem Solving: Slow processing;Decreased initiation;Difficulty sequencing;Requires verbal cues;Requires tactile cues        General Comments      Exercises General Exercises - Lower Extremity Long Arc Quad: AROM;Strengthening;Left;10 reps;Seated   Assessment/Plan    PT Assessment Patient needs continued PT services  PT Problem List Decreased strength;Decreased range of motion;Decreased activity tolerance;Decreased balance;Decreased mobility;Decreased coordination;Decreased cognition;Decreased knowledge of use of DME;Decreased safety awareness;Decreased knowledge of precautions       PT Treatment Interventions DME instruction;Stair training;Gait training;Therapeutic activities;Functional mobility training;Therapeutic exercise;Balance training;Neuromuscular re-education;Patient/family education;Cognitive remediation    PT Goals (Current goals can be found in the Care Plan section)  Acute Rehab PT Goals Patient Stated Goal: patient wants to go home today; wife wants him to go to rehab to get stronger before returning home PT Goal Formulation: With patient/family Time  For Goal Achievement: 01/04/19 Potential to Achieve Goals: Good    Frequency Min 3X/week   Barriers to discharge        Co-evaluation               AM-PAC PT "6 Clicks" Mobility  Outcome Measure Help needed turning from your back to your side while in a flat bed without using bedrails?: A Little Help needed moving from lying on your back to sitting on the side of a flat bed without using bedrails?: A Little Help needed moving to and from a bed to a chair (including a wheelchair)?: A Lot Help needed standing up from a chair using your arms (e.g., wheelchair or bedside chair)?: A Lot Help needed to walk in hospital room?: A Lot Help needed climbing 3-5 steps with a railing? : Total 6 Click Score: 13    End of Session Equipment Utilized During Treatment: Gait belt Activity Tolerance: Patient limited by fatigue Patient left: with call bell/phone within reach;with family/visitor present;Other (comment)(seated on BSC to attempt to have a BM) Nurse Communication: Mobility status;Precautions;Weight bearing status PT Visit Diagnosis: Other abnormalities of gait and mobility (R26.89)    Time: 1937-9024 PT Time Calculation (min) (ACUTE ONLY): 23 min   Charges:   PT Evaluation $PT Eval Moderate Complexity: 1 Mod PT Treatments $Therapeutic Activity: 8-22 mins        Sherie Don, PT, DPT  Acute Rehabilitation Services Pager [phone] Office [place] 12/21/2018, 1:02 PM

## 2018-12-21 NOTE — NC FL2 (Signed)
[place] MEDICAID FL2 LEVEL OF CARE SCREENING TOOL     IDENTIFICATION  Patient Name: Ronald Rice Birthdate: 21-May-1946 Sex: male Admission Date (Current Location): 12/20/2018  [facility] and [place] Number:  Herbalist and Address:  [facility], [place] [address], [place], [place] [address]      Provider Number: [id number]  Attending Physician Name and Address:  Terrilee Croak, MD  Relative Name and Phone Number:  [name] (DGLOVF)[phone]    Current Level of Care: Hospital Recommended Level of Care: [place] Prior Approval Number:    Date Approved/Denied:   PASRR Number: [id number]  Discharge Plan: SNF    Current Diagnoses: Patient Active Problem List   Diagnosis Date Noted  . Complex sleep apnea syndrome 12/18/2018  . Goals of care, counseling/discussion 10/31/2018  . Diffuse large B-cell lymphoma of lymph nodes of multiple regions ([place]) 10/31/2018  . Cellulitis of left leg 10/27/2018  . Displaced fracture of lateral malleolus of left fibula 09/13/2018  . Dream enactment behavior 07/26/2018  . Snoring 07/26/2018  . Hypersomnia with sleep apnea 07/26/2018  . Fever   . Acute cystitis with hematuria   . Ischemic stroke ([place]) 11/22/2017  . Right hip pain 07/18/2017  . Cerebellar ataxia in diseases classified elsewhere ([place]) 07/18/2017  . Radiculopathy 04/20/2017  . Chronic lymphoid leukemia ([place]) 01/25/2017  . CAD (coronary artery disease) 10/31/2016  . Influenza A 05/24/2016  . Acute bronchitis 05/22/2016  . Weakness 05/22/2016  . Sepsis ([place]) 05/22/2016  . Pedal edema 05/22/2016  . Gait abnormality 07/28/2015  . Nonspecific elevation of levels of transaminase and lactic acid dehydrogenase (LDH) 03/02/2015  . Pseudarthrosis after fusion or arthrodesis 06/03/2014  . Fusion of spine of cervical region 06/05/2013  . Fusion of lumbar spine 03/07/2012  . Benign prostatic hyperplasia 06/15/2011  . Liver cyst 07/29/2009   . Kidney cyst, acquired 06/17/2009  . Type 2 diabetes mellitus without complications ([place]) 06/01/3233  . Diverticulosis of colon 08/03/2007  . Hyperlipidemia 03/14/2007  . Neuropathy, peripheral 12/03/2005  . Allergic rhinitis 03/01/2000  . Essential (primary) hypertension 10/03/1996  . GERD (gastroesophageal reflux disease) 07/02/1995    Orientation RESPIRATION BLADDER Height & Weight     Time, Self, Situation, Place  Normal Incontinent, External catheter Weight: 280 lb (127 kg) Height:  6\' 2"  (188 cm)  BEHAVIORAL SYMPTOMS/MOOD NEUROLOGICAL BOWEL NUTRITION STATUS      Continent Diet(see discharge summary)  AMBULATORY STATUS COMMUNICATION OF NEEDS Skin   Limited Assist Verbally Other (Comment)(MASD groin, left leg closed surgical incision)                       Personal Care Assistance Level of Assistance  Bathing, Feeding, Dressing, Total care Bathing Assistance: Limited assistance Feeding assistance: Independent Dressing Assistance: Limited assistance Total Care Assistance: Limited assistance   Functional Limitations Info  Sight, Hearing, Speech Sight Info: Adequate Hearing Info: Adequate Speech Info: Adequate    SPECIAL CARE FACTORS FREQUENCY  PT (By licensed PT), OT (By licensed OT)     PT Frequency: min 5x weekly OT Frequency: min 5x weekly            Contractures Contractures Info: Not present    Additional Factors Info  Code Status, Allergies Code Status Info: full Allergies Info: No Known Allergies           Current Medications (12/21/2018):  This is the current hospital active medication list Current Facility-Administered Medications  Medication Dose Route  Frequency Provider Last Rate Last Dose  . acetaminophen (TYLENOL) tablet 650 mg  650 mg Oral Q6H PRN Jani Gravel, MD   650 mg at 12/21/18 0740   Or  . acetaminophen (TYLENOL) suppository 650 mg  650 mg Rectal Q6H PRN Jani Gravel, MD      . allopurinol (ZYLOPRIM) tablet 300 mg  300 mg Oral  Daily Jani Gravel, MD   300 mg at 12/21/18 0846  . aspirin tablet 325 mg  325 mg Oral Daily Jani Gravel, MD   325 mg at 12/21/18 0845  . atenolol (TENORMIN) tablet 25 mg  25 mg Oral Daily Jani Gravel, MD   25 mg at 12/21/18 0847  . atorvastatin (LIPITOR) tablet 40 mg  40 mg Oral Daily Jani Gravel, MD   40 mg at 12/21/18 0846  . ceFEPIme (MAXIPIME) 2 g in sodium chloride 0.9 % 100 mL IVPB  2 g Intravenous Q8H Dahal, Binaya, MD 200 mL/hr at 12/21/18 0744 2 g at 12/21/18 0744  . diphenhydrAMINE (BENADRYL) capsule 25 mg  25 mg Oral QHS PRN Jani Gravel, MD      . Derrill Memo ON 12/22/2018] enoxaparin (LOVENOX) injection 65 mg  65 mg Subcutaneous Q24H [place]      . famotidine (PEPCID) tablet 20 mg  20 mg Oral QHS PRN Jani Gravel, MD      . hydrochlorothiazide (HYDRODIURIL) tablet 25 mg  25 mg Oral Daily Jani Gravel, MD   25 mg at 12/21/18 0846  . insulin aspart (novoLOG) injection 0-5 Units  0-5 Units Subcutaneous QHS Dahal, Binaya, MD      . insulin aspart (novoLOG) injection 0-9 Units  0-9 Units Subcutaneous TID WC Terrilee Croak, MD   2 Units at 12/21/18 1257  . lisinopril (ZESTRIL) tablet 40 mg  40 mg Oral Daily Jani Gravel, MD   40 mg at 12/21/18 0846  . loratadine (CLARITIN) tablet 10 mg  10 mg Oral Daily Jani Gravel, MD   10 mg at 12/21/18 0846  . LORazepam (ATIVAN) tablet 0.5 mg  0.5 mg Oral Q6H PRN Jani Gravel, MD   0.5 mg at 12/20/18 1144  . potassium chloride (K-DUR) CR tablet 10 mEq  10 mEq Oral Daily Jani Gravel, MD   10 mEq at 12/21/18 0846  . prochlorperazine (COMPAZINE) tablet 10 mg  10 mg Oral Q6H PRN Jani Gravel, MD      . tamsulosin [facility]) capsule 0.4 mg  0.4 mg Oral Daily Jani Gravel, MD   0.4 mg at 12/21/18 0846  . tiZANidine (ZANAFLEX) tablet 4 mg  4 mg Oral Q12H PRN Jani Gravel, MD   4 mg at 12/21/18 0740  . vancomycin (VANCOCIN) IVPB 1000 mg/200 mL premix  1,000 mg Intravenous Q12H Romona Curls, RPH 200 mL/hr at 12/21/18 0845 1,000 mg at 12/21/18 0845     Discharge  Medications: Please see discharge summary for a list of discharge medications.  Relevant Imaging Results:  Relevant Lab Results:   Additional Information SSN: [id number]  Alberteen Sam, LCSW

## 2018-12-21 NOTE — Progress Notes (Addendum)
PROGRESS NOTE  Ronald Rice EUM:[id number] DOB: 23-Jul-1945 DOA: 12/20/2018 PCP: Deland Pretty, MD   LOS: 0 days   Brief narrative: Patient is a 73 year old African-American male with history of hypertension, diabetes mellitus, CAD, GERD, diffuse large cell lymphoma, OSA who had left ankle revision ORIF revision on 7/28. At home, he had a postop fever and confusion. He was taken to [facility]  Over there, his lactic acid was elevated 2.53.  Blood culture was collected.  He was given IV Zosyn, IV vancomycin and 2 L of normal saline. Orthopedics at [facility] was called and patient was transferred to [facility] ED  In [facility] ED, temperature 98.9, heart rate 103, breathing comfortably on room air. WBC 9.6, hemoglobin 10.9, platelet 212 Sodium 136, creatinine 1.17  Urinalysis normal CT Angio of chest was negative for PE or thoracic aortic dissection and showed an overall decrease in left supraclavicular and mediastinal adenopathy. COVID-19 negative  Patient was admitted to hospitalist service and orthopedic consultation was called.  Subjective: Patient was seen and examined this afternoon.  Pleasant elderly African-American male.  Sitting up in commode.  Not in distress.  Mental status improving compared to yesterday, not back to baseline.  Wife at bedside.  He had a long conversation about his status, prognosis and disposition plan.  Assessment/Plan:  Principal Problem:   Fever Active Problems:   Chronic lymphoid leukemia (HCC)   Type 2 diabetes mellitus without complications (HCC)   Essential (primary) hypertension   Hyperlipidemia  Postop fever with altered mental status -Fever at home.  Unknown severity.  Associated with altered mental status. -Unclear source.  CT Angio of chest did not suggest any consolidation.  Urinalysis normal.  COVID-19 test negative. -Orthopedics evaluated right ankle surgical site.  No evidence of infection there. -Patient complains of left flank and  left upper quadrant pain today.  Gives a history of multiple renal cyst.  I ordered for renal ultrasound to rule out pyelonephritis. -Currently on broad-spectrum antibiotic coverage with IV cefepime and vancomycin.  No history of MRSA.  I will stop IV vancomycin today. -I looked up his records at [facility].  Preliminary blood culture report has not shown any growth. -WBC normal, no recurrence of fever in the hospital.  Continue to monitor. -Because of his immunocompromise status from chemotherapy, I will continue him on IV antibiotics for now till blood culture report finalizes.  Acute delirium  -Likely precipitated by fever.  Per patient's wife, patient tends to have confusion intermittently. -His mental status seems improving today, not yet back to baseline.  Struggles to tell the month and year.  Recent left leg surgery -On 7/28, patient had revision ORIF of left ankle and removal of hardware. -Orthopedic follow-up appreciated.  No evidence of wound infection at this time.  Noted a plan of splinting.  Postoperative follow-up in 2 weeks.  Nonweightbearing to the left lower extremity.  DM2 -Hemoglobin A1c 8. -Prior to admission, patient was on metformin.   Currently on hold because of the use of contrast dye. -Continue sliding scale insulin with Accu-Cheks. -Modified carb diet  Cardiovascular issues: hypertension, Hyperlipidemia, CAD PTA, patient was on aspirin, statin, atenolol, HCTZ, lisinopril.  Continue all.  BPH Cont Flomax  Arthritis - keep Meloxicam on hold.  Impaired mobility -Wife states that she does not have any help at home.  PT evaluation appreciated.  SNF recommended.  Patient and family inquiring about rehab placement.  Discussed with Education officer, museum.  Mobility:  PT eval appreciated.  Diet: Cardiac/diabetic diet DVT prophylaxis: Lovenox Code Status:  Code Status: Full Code  Family Communication: Spoke with patient's wife this afternoon.  Expected Discharge:  Anticipate medical stability by tomorrow.  -Because of his immunocompromise status from chemotherapy, I will continue him on IV antibiotics for now till blood culture report finalizes.  I will switch him to inpatient status.  Consultants:  Orthopedics  Procedures:  None  Antimicrobials:  Anti-infectives (From admission, onward)   Start     Dose/Rate Route Frequency Ordered Stop   12/20/18 2000  vancomycin (VANCOCIN) IVPB 1000 mg/200 mL premix     1,000 mg 200 mL/hr over 60 Minutes Intravenous Every 12 hours 12/20/18 1205     12/20/18 1600  ceFEPIme (MAXIPIME) 2 g in sodium chloride 0.9 % 100 mL IVPB     2 g 200 mL/hr over 30 Minutes Intravenous Every 8 hours 12/20/18 0723     12/20/18 0730  ceFEPIme (MAXIPIME) 2 g in sodium chloride 0.9 % 100 mL IVPB     2 g 200 mL/hr over 30 Minutes Intravenous  Once 12/20/18 0716 12/20/18 0828   12/20/18 0730  vancomycin (VANCOCIN) 2,500 mg in sodium chloride 0.9 % 500 mL IVPB  Status:  Discontinued     2,500 mg 250 mL/hr over 120 Minutes Intravenous Every 24 hours 12/20/18 0719 12/20/18 1205      Infusions:  . ceFEPime (MAXIPIME) IV 2 g (12/21/18 0744)  . vancomycin 1,000 mg (12/21/18 0845)    Scheduled Meds: . allopurinol  300 mg Oral Daily  . aspirin  325 mg Oral Daily  . atenolol  25 mg Oral Daily  . atorvastatin  40 mg Oral Daily  . [START ON 12/22/2018] enoxaparin (LOVENOX) injection  65 mg Subcutaneous Q24H  . hydrochlorothiazide  25 mg Oral Daily  . insulin aspart  0-5 Units Subcutaneous QHS  . insulin aspart  0-9 Units Subcutaneous TID WC  . lisinopril  40 mg Oral Daily  . loratadine  10 mg Oral Daily  . potassium chloride  10 mEq Oral Daily  . tamsulosin  0.4 mg Oral Daily    PRN meds: acetaminophen **OR** acetaminophen, diphenhydrAMINE, famotidine, LORazepam, prochlorperazine, tiZANidine   Objective: Vitals:   12/21/18 0514 12/21/18 1025  BP: (!) 145/88 (!) 141/87  Pulse: (!) 104 95  Resp:  16 15  Temp: 99.2 F (37.3 C) 98.4 F (36.9 C)  SpO2: 94% 99%    Intake/Output Summary (Last 24 hours) at 12/21/2018 1400 Last data filed at 12/21/2018 0815 Gross per 24 hour  Intake 760 ml  Output 800 ml  Net -40 ml   Filed Weights   12/20/18 0243  Weight: 127 kg   Weight change:  Body mass index is 35.95 kg/m.   Physical Exam: General exam: Appears calm and comfortable.  Skin: No rashes, lesions or ulcers. HEENT: Atraumatic, normocephalic, supple neck, no obvious bleeding Lungs: Clear to auscultation bilaterally CVS: Regular rate and rhythm, no murmur GI/Abd soft, mild tenderness present on left upper quadrant and left flank, bowel sounds present CNS: Alert, awake, oriented to place and person, struggles to tell the month and date. Psychiatry: Mood appropriate Extremities: Trace pedal edema on the right lower extremity, bandaged left lower extremity  Data Review: I have personally reviewed the laboratory data and studies available.  Recent Labs  Lab 12/19/18 0858 12/20/18 0252 12/21/18 0252  WBC 6.7 9.6 7.1  NEUTROABS  --  6.9  --   HGB 12.4* 10.9* 10.7*  HCT 34.7* 30.1*  29.6*  MCV 86.8 86.2 85.8  PLT 230 212 213   Recent Labs  Lab 12/19/18 0858 12/20/18 0252 12/21/18 0252  NA 138 136 137  K 4.4 3.8 3.4*  CL 100 101 102  CO2 27 24 25   GLUCOSE 173* 192* 160*  BUN 8 9 7*  CREATININE 1.11 1.17 0.96  CALCIUM 9.2 8.1* 8.7*    Terrilee Croak, MD  [facility] 12/21/2018

## 2018-12-22 ENCOUNTER — Ambulatory Visit: Payer: Medicare HMO

## 2018-12-22 DIAGNOSIS — I10 Essential (primary) hypertension: Secondary | ICD-10-CM

## 2018-12-22 LAB — GLUCOSE, CAPILLARY
Glucose-Capillary: 159 mg/dL — ABNORMAL HIGH (ref 70–99)
Glucose-Capillary: 152 mg/dL — ABNORMAL HIGH (ref 70–99)
Glucose-Capillary: 167 mg/dL — ABNORMAL HIGH (ref 70–99)
Glucose-Capillary: 159 mg/dL — ABNORMAL HIGH (ref 70–99)

## 2018-12-22 MED ORDER — SODIUM CHLORIDE 0.9% FLUSH: 10.0000 mL | INTRAVENOUS | Status: DC | PRN

## 2018-12-22 MED ORDER — AMOXICILLIN-POT CLAVULANATE 875-125 MG PO TABS
1.0000 | ORAL_TABLET | Freq: Two times a day (BID) | ORAL | Status: DC
  Administered 2018-12-22 – 2018-12-25 (×6): 1 via ORAL
  Filled 2018-12-22 (×6): qty 1

## 2018-12-22 NOTE — Progress Notes (Signed)
CSW updated patient's spouse that they have a bed at [facility] pending insurance authorization.   [place], [place]

## 2018-12-22 NOTE — Progress Notes (Signed)
Physical Therapy Treatment Patient Details Name: Ronald Rice MRN: [id number] DOB: 02-15-1946 Today's Date: 12/22/2018    History of Present Illness Pt is a 73 y/o male admitted secondary to fever. COVID test was negative. Pt recently had a L ankle ORIF on 12/19/18. PMH including but not limited to CLL on chemo, CAD, DM, HTN and original L ankle ORIF on 09/13/18.    PT Comments    Pt making slow progress with functional mobility. He continues to have difficulties with maintaining NWB L LE and requires constant cueing and occasional physical assistance to keep his L LE off of the floor. Pt practiced sit<>stands x3 and was able to stand statically for up to 60 seconds with min A x2 for stability and safety. Pt would continue to benefit from skilled physical therapy services at this time while admitted and after d/c to address the below listed limitations in order to improve overall safety and independence with functional mobility.    Follow Up Recommendations  SNF;Supervision/Assistance - 24 hour     Equipment Recommendations  None recommended by PT    Recommendations for Other Services       Precautions / Restrictions Precautions Precautions: Fall Precaution Comments: hx of falls Restrictions Weight Bearing Restrictions: Yes LLE Weight Bearing: Non weight bearing    Mobility  Bed Mobility Overal bed mobility: Needs Assistance Bed Mobility: Sit to Supine;Supine to Sit     Supine to sit: Min guard Sit to supine: Min guard   General bed mobility comments: increased time and effort needed, use of bed rails, cueing for technique  Transfers Overall transfer level: Needs assistance Equipment used: Rolling walker (2 wheeled) Transfers: Sit to/from Stand Sit to Stand: From elevated surface;+2 physical assistance;Mod assist;Min assist         General transfer comment: increased time and effort, max cueing to maintain NWB L LE throughout and for safe hand placement and technique.  Pt required physical assistance and frequent cueing to maintain NWB L LE throughout  Ambulation/Gait             General Gait Details: unsafe to attempt at this time secondary to pt's inability to maintain NWB L LE   Stairs             Wheelchair Mobility    Modified Rankin (Stroke Patients Only)       Balance Overall balance assessment: History of Falls;Needs assistance Sitting-balance support: Feet supported Sitting balance-Leahy Scale: Fair     Standing balance support: Bilateral upper extremity supported Standing balance-Leahy Scale: Poor Standing balance comment: heavily reliant on bilateral UEs for support                            Cognition Arousal/Alertness: Awake/alert Behavior During Therapy: Anxious;Impulsive Overall Cognitive Status: Impaired/Different from baseline Area of Impairment: Attention;Following commands;Memory;Safety/judgement;Problem solving                   Current Attention Level: Selective Memory: Decreased short-term memory;Decreased recall of precautions Following Commands: Follows one step commands consistently;Follows one step commands with increased time Safety/Judgement: Decreased awareness of safety;Decreased awareness of deficits   Problem Solving: Slow processing;Decreased initiation;Difficulty sequencing;Requires verbal cues;Requires tactile cues        Exercises General Exercises - Lower Extremity Long Arc Quad: AROM;Strengthening;Left;10 reps;Seated    General Comments        Pertinent Vitals/Pain Pain Assessment: Faces Faces Pain Scale: Hurts little more Pain Location: L ankle  and calf Pain Descriptors / Indicators: Guarding;Sore Pain Intervention(s): Monitored during session;Repositioned    Home Living                      Prior Function            PT Goals (current goals can now be found in the care plan section) Acute Rehab PT Goals PT Goal Formulation: With  patient/family Time For Goal Achievement: 01/04/19 Potential to Achieve Goals: Good Progress towards PT goals: Progressing toward goals    Frequency    Min 3X/week      PT Plan Current plan remains appropriate    Co-evaluation              AM-PAC PT "6 Clicks" Mobility   Outcome Measure  Help needed turning from your back to your side while in a flat bed without using bedrails?: A Little Help needed moving from lying on your back to sitting on the side of a flat bed without using bedrails?: A Little Help needed moving to and from a bed to a chair (including a wheelchair)?: A Lot Help needed standing up from a chair using your arms (e.g., wheelchair or bedside chair)?: A Lot Help needed to walk in hospital room?: A Lot Help needed climbing 3-5 steps with a railing? : Total 6 Click Score: 13    End of Session Equipment Utilized During Treatment: Gait belt Activity Tolerance: Patient limited by fatigue;Patient limited by pain Patient left: in bed;with call bell/phone within reach;with bed alarm set Nurse Communication: Mobility status;Precautions;Weight bearing status PT Visit Diagnosis: Other abnormalities of gait and mobility (R26.89)     Time: 8032-1224 PT Time Calculation (min) (ACUTE ONLY): 27 min  Charges:  $Therapeutic Activity: 23-37 mins                     Sherie Don, PT, DPT  Acute Rehabilitation Services Pager [phone] Office [place] 12/22/2018, 2:52 PM

## 2018-12-22 NOTE — Progress Notes (Signed)
PROGRESS NOTE  Ronald Rice QIO:[id number] DOB: [birth year] DOA: 12/20/2018 PCP: Deland Pretty, MD   LOS: 1 day   Brief narrative: Patient is a 73 year old African-American male with history of hypertension, diabetes mellitus, CAD, GERD, diffuse large cell lymphoma, OSA who had left ankle revision ORIF revision on 7/28. At home, he had a postop fever and confusion. He was taken to [facility]  Over there, his lactic acid was elevated 2.53.  Blood culture was collected.  He was given IV Zosyn, IV vancomycin and 2 L of normal saline. Orthopedics at [facility] was called and patient was transferred to [facility] ED  In [facility] ED, temperature 98.9, heart rate 103, breathing comfortably on room air. WBC 9.6, hemoglobin 10.9, platelet 212 Sodium 136, creatinine 1.17  Urinalysis normal CT Angioof chest was negative for PE or thoracic aortic dissection and showed an overall decrease in left supraclavicular and mediastinal adenopathy. COVID-19 negative  Patient was admitted to hospitalist service and orthopedic consultation was called. No surgical intervention was required.  Subjective: Patient was seen and examined this morning.  Pleasant elderly African-American male.  Lying down in bed.  Not in distress.  No recurrence of fever.  Mental status is clearing up.  Assessment/Plan:  Principal Problem:   Fever Active Problems:   Chronic lymphoid leukemia (HCC)   Type 2 diabetes mellitus without complications (HCC)   Essential (primary) hypertension   Hyperlipidemia  Postop fever with altered mental status -Fever at home.  Unknown severity.  Associated with altered mental status. -Unclear source.  CT Angioof chest did not suggest any consolidation. Urinalysis normal. COVID-19 test negative. -Orthopedicsevaluated right ankle surgical site. No evidence of infection there. -Patient complains of left flank and left upper quadrant pain today.  Gives a history of multiple renal cyst.  Renal  ultrasound was obtained which does not show any evidence of pyelonephritis or any change in the status of the cysts. -Wound culture obtained at [facility] ED records show any growth in 2 days. -WBC normal, no recurrence of fever in the hospital.  Continue to monitor. -Patient is currently on IV cefepime.  I will switch him to oral Augmentin at this time.  Acute delirium  -Likely precipitated by fever. Per patient's wife, patient tends to have confusion intermittently. -His mental status is gradually improving.    Recent left leg surgery -On 7/28, patient had revision ORIF of left ankle and removal of hardware. -Orthopedic follow-up appreciated.  No evidence of wound infection at this time.  Noted a plan of splinting.  Postoperative follow-up in 2 weeks.  Nonweightbearing to the left lower extremity.  DM2 -Hemoglobin A1c 8. -Prior to admission, patient was on metformin.  Currently on hold because of the use of contrast dye. -Continue sliding scale insulin with Accu-Cheks. -Modified carb diet  Cardiovascular issues:hypertension, Hyperlipidemia, CAD -PTA, patient was on aspirin, statin, atenolol, HCTZ, lisinopril. Continue all.  BPH -Cont Flomax  Arthritis - keep Meloxicam on hold.  Impaired mobility -Wife states that she does not have any help at home.  PT evaluation appreciated.  SNF recommended.  Pending insurance authorization  Mobility: PT eval appreciated. Diet:Cardiac/diabetic diet DVT prophylaxis:Lovenox Code Status:Code Status: Full Code Family Communication: Expected Discharge:Medically stable for discharge.  Pending insurance authorizing for SNF placement.  Antimicrobials:  Anti-infectives (From admission, onward)   Start     Dose/Rate Route Frequency Ordered Stop   12/22/18 2200  amoxicillin-clavulanate (AUGMENTIN) 875-125 MG per tablet 1 tablet     1 tablet Oral Every 12  hours 12/22/18 1558     12/20/18 2000  vancomycin (VANCOCIN) IVPB 1000 mg/200  mL premix  Status:  Discontinued     1,000 mg 200 mL/hr over 60 Minutes Intravenous Every 12 hours 12/20/18 1205 12/22/18 1035   12/20/18 1600  ceFEPIme (MAXIPIME) 2 g in sodium chloride 0.9 % 100 mL IVPB  Status:  Discontinued     2 g 200 mL/hr over 30 Minutes Intravenous Every 8 hours 12/20/18 0723 12/22/18 1558   12/20/18 0730  ceFEPIme (MAXIPIME) 2 g in sodium chloride 0.9 % 100 mL IVPB     2 g 200 mL/hr over 30 Minutes Intravenous  Once 12/20/18 0716 12/20/18 0828   12/20/18 0730  vancomycin (VANCOCIN) 2,500 mg in sodium chloride 0.9 % 500 mL IVPB  Status:  Discontinued     2,500 mg 250 mL/hr over 120 Minutes Intravenous Every 24 hours 12/20/18 0719 12/20/18 1205      Infusions:    Scheduled Meds: . allopurinol  300 mg Oral Daily  . amoxicillin-clavulanate  1 tablet Oral Q12H  . aspirin  325 mg Oral Daily  . atenolol  25 mg Oral Daily  . atorvastatin  40 mg Oral Daily  . enoxaparin (LOVENOX) injection  65 mg Subcutaneous Q24H  . hydrochlorothiazide  25 mg Oral Daily  . insulin aspart  0-5 Units Subcutaneous QHS  . insulin aspart  0-9 Units Subcutaneous TID WC  . lisinopril  40 mg Oral Daily  . loratadine  10 mg Oral Daily  . potassium chloride  10 mEq Oral Daily  . tamsulosin  0.4 mg Oral Daily    PRN meds: acetaminophen **OR** acetaminophen, diphenhydrAMINE, famotidine, LORazepam, prochlorperazine, sodium chloride flush, tiZANidine   Objective: Vitals:   12/22/18 0825 12/22/18 1449  BP: (!) 142/90 (!) 152/94  Pulse: 97 86  Resp: 16 16  Temp:  98.2 F (36.8 C)  SpO2: 100% 100%    Intake/Output Summary (Last 24 hours) at 12/22/2018 1559 Last data filed at 12/22/2018 1300 Gross per 24 hour  Intake 900 ml  Output 2400 ml  Net -1500 ml   Filed Weights   12/20/18 0243  Weight: 127 kg   Weight change:  Body mass index is 35.95 kg/m.   Physical Exam: General exam: Appears calm and comfortable.  Skin: No rashes, lesions or ulcers. HEENT: Atraumatic,  normocephalic, supple neck, no obvious bleeding Lungs: Clear to auscultation bilaterally CVS: Regular rate and rhythm, no murmur GI/Abd soft, nontender, nondistended, bowel sound present CNS: Alert, awake oriented x2.  Unable to specific in time Psychiatry: Mood appropriate Extremities: No pedal edema, no calf tenderness  Data Review: I have personally reviewed the laboratory data and studies available.  Recent Labs  Lab 12/19/18 0858 12/20/18 0252 12/21/18 0252  WBC 6.7 9.6 7.1  NEUTROABS  --  6.9  --   HGB 12.4* 10.9* 10.7*  HCT 34.7* 30.1* 29.6*  MCV 86.8 86.2 85.8  PLT 230 212 213   Recent Labs  Lab 12/19/18 0858 12/20/18 0252 12/21/18 0252  NA 138 136 137  K 4.4 3.8 3.4*  CL 100 101 102  CO2 27 24 25   GLUCOSE 173* 192* 160*  BUN 8 9 7*  CREATININE 1.11 1.17 0.96  CALCIUM 9.2 8.1* 8.7*    Terrilee Croak, MD  [facility] 12/22/2018

## 2018-12-23 LAB — CBC WITH DIFFERENTIAL/PLATELET
Abs Immature Granulocytes: 0.05 10*3/uL (ref 0.00–0.07)
Basophils Absolute: 0.1 10*3/uL (ref 0.0–0.1)
Basophils Relative: 1 %
Eosinophils Absolute: 0.2 10*3/uL (ref 0.0–0.5)
Eosinophils Relative: 5 %
HCT: 31.2 % — ABNORMAL LOW (ref 39.0–52.0)
Hemoglobin: 11.4 g/dL — ABNORMAL LOW (ref 13.0–17.0)
Immature Granulocytes: 1 %
Lymphocytes Relative: 14 %
Lymphs Abs: 0.7 10*3/uL (ref 0.7–4.0)
MCH: 31.1 pg (ref 26.0–34.0)
MCHC: 36.5 g/dL — ABNORMAL HIGH (ref 30.0–36.0)
MCV: 85.2 fL (ref 80.0–100.0)
Monocytes Absolute: 1.1 10*3/uL — ABNORMAL HIGH (ref 0.1–1.0)
Monocytes Relative: 23 %
Neutro Abs: 2.8 10*3/uL (ref 1.7–7.7)
Neutrophils Relative %: 56 %
Platelets: 242 10*3/uL (ref 150–400)
RBC: 3.66 MIL/uL — ABNORMAL LOW (ref 4.22–5.81)
RDW: 13.3 % (ref 11.5–15.5)
WBC: 5 10*3/uL (ref 4.0–10.5)
nRBC: 0 % (ref 0.0–0.2)

## 2018-12-23 LAB — BASIC METABOLIC PANEL
Anion gap: 12 (ref 5–15)
BUN: 10 mg/dL (ref 8–23)
CO2: 25 mmol/L (ref 22–32)
Calcium: 9 mg/dL (ref 8.9–10.3)
Chloride: 103 mmol/L (ref 98–111)
Creatinine, Ser: 0.95 mg/dL (ref 0.61–1.24)
GFR calc Af Amer: >60 mL/min (ref >60)
GFR calc non Af Amer: >60 mL/min (ref >60)
Glucose, Bld: 149 mg/dL — ABNORMAL HIGH (ref 70–99)
Potassium: 3.2 mmol/L — ABNORMAL LOW (ref 3.5–5.1)
Sodium: 140 mmol/L (ref 135–145)

## 2018-12-23 LAB — GLUCOSE, CAPILLARY
Glucose-Capillary: 139 mg/dL — ABNORMAL HIGH (ref 70–99)
Glucose-Capillary: 159 mg/dL — ABNORMAL HIGH (ref 70–99)
Glucose-Capillary: 166 mg/dL — ABNORMAL HIGH (ref 70–99)
Glucose-Capillary: 147 mg/dL — ABNORMAL HIGH (ref 70–99)
Glucose-Capillary: 132 mg/dL — ABNORMAL HIGH (ref 70–99)

## 2018-12-23 MED ORDER — TRAMADOL HCL 50 MG PO TABS
50.0000 mg | ORAL_TABLET | Freq: Four times a day (QID) | ORAL | Status: DC | PRN
  Administered 2018-12-23 – 2018-12-25 (×4): 50 mg via ORAL
  Filled 2018-12-23 (×4): qty 1

## 2018-12-23 MED ORDER — POTASSIUM CHLORIDE CRYS ER 20 MEQ PO TBCR
20.0000 meq | EXTENDED_RELEASE_TABLET | Freq: Every day | ORAL | Status: DC
  Administered 2018-12-23 – 2018-12-25 (×3): 20 meq via ORAL
  Filled 2018-12-23 (×3): qty 1

## 2018-12-23 NOTE — Progress Notes (Signed)
PROGRESS NOTE  Ronald Rice IRS:[id number] DOB: [birth year] DOA: 12/20/2018 PCP: Deland Pretty, MD   LOS: 2 days   Brief narrative: Patient is a 73 year old African-American male with history of hypertension, diabetes mellitus, CAD, GERD, diffuse large cell lymphoma, OSA who hadleftanklerevisionORIF revision on 7/28. At home, he had apostop fever and confusion. [facility] Over there, hislactic acid was elevated 2.53. Blood culture was collected. He was given IV Zosyn, IV vancomycin and 2 L of normal saline. [facility], temperature 98.9, heart rate 103, breathing comfortably on room air. WBC 9.6, hemoglobin 10.9, platelet 212 Sodium 136, creatinine 1.17  Urinalysis normal CT Angioof chest was negative for PE or thoracic aortic dissection and showed an overall decrease in left supraclavicular and mediastinal adenopathy. COVID-19 negative  Patient was admitted to hospitalist serviceand orthopedic consultation was called. No surgical intervention was required.  Subjective: Patient was seen and examined this morning. Pleasant elderly African-American male.  Lying down in bed.  Not in distress.  No recurrence of fever.  Mental status is clearing up.  Wife at bedside.  Complains of back pain.  Noted that he was on Zanaflex and tramadol as needed at home.  Currently on Zanaflex only.  Tramadol resumed. Pending insurance authorization for discharge to rehab.  Assessment/Plan:  Principal Problem:   Fever Active Problems:   Chronic lymphoid leukemia (HCC)   Type 2 diabetes mellitus without complications (HCC)   Essential (primary) hypertension   Hyperlipidemia  Postop fever with altered mental status -Fever at home.Unknown severity. Associated with altered mental status. -Unclear source. CT Angioof chest did not suggest any consolidation. Urinalysis normal. COVID-19 test  negative. -Orthopedicsevaluated right ankle surgical site. No evidence of infection there. -Patient complains of left flank and left upper quadrant pain. He has a history of multiple renal cyst.  Renal ultrasound was obtained which does not show any evidence of pyelonephritis or any change in the status of the cysts. -Wound culture obtained at [facility] ED records show any growth in 3 days. -WBC normal,no recurrence of fever in the hospital. Continue to monitor. -Patient is currently on oral Augmentin at this time.  Acute delirium  -Likely precipitated by fever. Per patient's wife, patient tends to have confusion intermittently. -His mental status is gradually improving.    Recent left leg surgery -On 7/28, patient had revision ORIF of left ankle and removal of hardware. -Orthopedic follow-up appreciated. No evidence of wound infection at this time. Noted a plan of splinting.Postoperative follow-up in 2 weeks. Nonweightbearing to the left lower extremity.  DM2 -Hemoglobin A1c 8. -Prior to admission, patient was on metformin.Currently onhold because of the use of contrast dye. -Continuesliding scale insulin with Accu-Cheks. -Modified carb diet  Cardiovascular issues:hypertension, Hyperlipidemia, CAD -PTA, patient was on aspirin, statin, atenolol, HCTZ, lisinopril. Continue all.  BPH -Cont Flomax  Arthritis - keep Meloxicam on hold.  Resumed Zanaflex and tramadol as needed.  Impaired mobility -Wife states that she does not have any help at home. PT evaluation appreciated. SNF recommended.  Pending insurance authorization  Mobility:PT eval appreciated. Diet:Cardiac/diabetic diet DVT prophylaxis:Lovenox Code Status:Code Status: Full Code Family Communication: Expected Discharge:Medically stable for discharge. Pending insurance authorizing for SNF placement.   Antimicrobials:  Anti-infectives (From admission, onward)   Start     Dose/Rate Route  Frequency Ordered Stop   12/22/18 2200  amoxicillin-clavulanate (AUGMENTIN) 875-125 MG per tablet 1 tablet     1 tablet Oral Every  12 hours 12/22/18 1558     12/20/18 2000  vancomycin (VANCOCIN) IVPB 1000 mg/200 mL premix  Status:  Discontinued     1,000 mg 200 mL/hr over 60 Minutes Intravenous Every 12 hours 12/20/18 1205 12/22/18 1035   12/20/18 1600  ceFEPIme (MAXIPIME) 2 g in sodium chloride 0.9 % 100 mL IVPB  Status:  Discontinued     2 g 200 mL/hr over 30 Minutes Intravenous Every 8 hours 12/20/18 0723 12/22/18 1558   12/20/18 0730  ceFEPIme (MAXIPIME) 2 g in sodium chloride 0.9 % 100 mL IVPB     2 g 200 mL/hr over 30 Minutes Intravenous  Once 12/20/18 0716 12/20/18 0828   12/20/18 0730  vancomycin (VANCOCIN) 2,500 mg in sodium chloride 0.9 % 500 mL IVPB  Status:  Discontinued     2,500 mg 250 mL/hr over 120 Minutes Intravenous Every 24 hours 12/20/18 0719 12/20/18 1205      Infusions:    Scheduled Meds: . allopurinol  300 mg Oral Daily  . amoxicillin-clavulanate  1 tablet Oral Q12H  . aspirin  325 mg Oral Daily  . atenolol  25 mg Oral Daily  . atorvastatin  40 mg Oral Daily  . enoxaparin (LOVENOX) injection  65 mg Subcutaneous Q24H  . hydrochlorothiazide  25 mg Oral Daily  . insulin aspart  0-5 Units Subcutaneous QHS  . insulin aspart  0-9 Units Subcutaneous TID WC  . lisinopril  40 mg Oral Daily  . loratadine  10 mg Oral Daily  . potassium chloride  20 mEq Oral Daily  . tamsulosin  0.4 mg Oral Daily    PRN meds: acetaminophen **OR** acetaminophen, diphenhydrAMINE, famotidine, LORazepam, prochlorperazine, sodium chloride flush, tiZANidine, traMADol   Objective: Vitals:   12/23/18 0405 12/23/18 0808  BP: (!) 141/84 (!) 166/81  Pulse: 96 97  Resp: 18 18  Temp: 98.3 F (36.8 C) 98.7 F (37.1 C)  SpO2: 98% 98%    Intake/Output Summary (Last 24 hours) at 12/23/2018 1306 Last data filed at 12/22/2018 2200 Gross per 24 hour  Intake 480 ml  Output 1100 ml  Net  -620 ml   Filed Weights   12/20/18 0243 12/23/18 0500  Weight: 127 kg 130 kg   Weight change:  Body mass index is 36.8 kg/m.   Physical Exam: General exam: Appears calm and comfortable.  Skin: No rashes, lesions or ulcers. HEENT: Atraumatic, normocephalic, supple neck, no obvious bleeding Lungs: Clear to auscultation bilaterally CVS: Regular rate and rhythm, no murmur GI/Abd soft, nontender, nondistended, bowel sound present CNS: Alert, awake, oriented x3 Psychiatry: Mood appropriate Extremities: Trace bilateral pedal edema, no calf tenderness, left leg on bandage.  Data Review: I have personally reviewed the laboratory data and studies available.  Recent Labs  Lab 12/19/18 0858 12/20/18 0252 12/21/18 0252 12/23/18 0400  WBC 6.7 9.6 7.1 5.0  NEUTROABS  --  6.9  --  2.8  HGB 12.4* 10.9* 10.7* 11.4*  HCT 34.7* 30.1* 29.6* 31.2*  MCV 86.8 86.2 85.8 85.2  PLT 230 212 213 242   Recent Labs  Lab 12/19/18 0858 12/20/18 0252 12/21/18 0252 12/23/18 0400  NA 138 136 137 140  K 4.4 3.8 3.4* 3.2*  CL 100 101 102 103  CO2 27 24 25 25   GLUCOSE 173* 192* 160* 149*  BUN 8 9 7* 10  CREATININE 1.11 1.17 0.96 0.95  CALCIUM 9.2 8.1* 8.7* 9.0    Terrilee Croak, MD  [facility] 12/23/2018

## 2018-12-23 NOTE — Plan of Care (Signed)
  Problem: Education: Goal: Knowledge of General Education information will improve Description: Including pain rating scale, medication(s)/side effects and non-pharmacologic comfort measures Outcome: Progressing   Problem: Activity: Goal: Risk for activity intolerance will decrease Outcome: Progressing   Problem: Nutrition: Goal: Adequate nutrition will be maintained Outcome: Progressing   

## 2018-12-24 LAB — GLUCOSE, CAPILLARY
Glucose-Capillary: 135 mg/dL — ABNORMAL HIGH (ref 70–99)
Glucose-Capillary: 174 mg/dL — ABNORMAL HIGH (ref 70–99)
Glucose-Capillary: 167 mg/dL — ABNORMAL HIGH (ref 70–99)
Glucose-Capillary: 142 mg/dL — ABNORMAL HIGH (ref 70–99)

## 2018-12-24 MED ORDER — TIZANIDINE HCL 4 MG PO TABS: 4.0000 mg | ORAL_TABLET | Freq: Two times a day (BID) | ORAL | 0 refills | Status: AC | PRN

## 2018-12-24 MED ORDER — TRAMADOL HCL 50 MG PO TABS: 50.0000 mg | ORAL_TABLET | Freq: Four times a day (QID) | ORAL | 0 refills | Status: AC | PRN

## 2018-12-24 MED ORDER — PROCHLORPERAZINE MALEATE 10 MG PO TABS: 10.0000 mg | ORAL_TABLET | Freq: Four times a day (QID) | ORAL | 0 refills | Status: DC | PRN

## 2018-12-24 MED ORDER — AMOXICILLIN-POT CLAVULANATE 875-125 MG PO TABS: 1.0000 | ORAL_TABLET | Freq: Two times a day (BID) | ORAL | 0 refills | Status: AC

## 2018-12-24 NOTE — Progress Notes (Signed)
PROGRESS NOTE  Ronald Rice OMV:[id number] DOB: 1945-09-04 DOA: 12/20/2018 PCP: Deland Pretty, MD   LOS: 3 days   Brief narrative: Patient is a 73 year old African-American male with history of hypertension, diabetes mellitus, CAD, GERD, diffuse large cell lymphoma, OSA who hadleftanklerevisionORIF revision on 7/28. At home, he had apostop fever and confusion. [facility] Over there, hislactic acid was elevated 2.53. Blood culture was collected. He was given IV Zosyn, IV vancomycin and 2 L of normal saline. [facility]  [facility], temperature 98.9, heart rate 103, breathing comfortably on room air. WBC 9.6, hemoglobin 10.9, platelet 212 Sodium 136, creatinine 1.17  Urinalysis normal CT Angioof chest was negative for PE or thoracic aortic dissection and showed an overall decrease in left supraclavicular and mediastinal adenopathy. COVID-19 negative  Patient was admitted to hospitalist serviceand orthopedic consultation was called.No surgical intervention was required.  Subjective: Patient was seen and examined this morning.  Patient elderly African-American male.  Sitting up in chair.  Not in distress.  Assessment/Plan:  Principal Problem:   Fever Active Problems:   Chronic lymphoid leukemia (HCC)   Type 2 diabetes mellitus without complications (HCC)   Essential (primary) hypertension   Hyperlipidemia  Postop fever with altered mental status -Fever at home.Unknown severity. Associated with altered mental status. -Unclear source. CT Angioof chest did not suggest any consolidation. Urinalysis normal. COVID-19 test negative. -Orthopedicsevaluated right ankle surgical site. No evidence of infection there. -Patient complains of left flank and left upper quadrant pain. He has a history of multiple renal cyst.Renal ultrasound was obtained which does not show any evidence of  pyelonephritis or any change in the status of the cysts. -Wound culture obtained [facility] ED records show any growth in 3 days. -WBC normal,no recurrence of fever in the hospital. Continue to monitor. -Patient is currently on oral Augmentin.  Acute delirium  -Likely precipitated by fever. Per patient's wife, patient tends to have confusion intermittently. -His mental statusis gradually improving.  Recent left leg surgery -On 7/28, patient had revision ORIF of left ankle and removal of hardware. -Orthopedic follow-up appreciated. No evidence of wound infection at this time. Noted a plan of splinting.Postoperative follow-up in 2 weeks. Nonweightbearing to the left lower extremity.  DM2 -Hemoglobin A1c 8. -Prior to admission, patient was on metformin.Currently onhold because of the use of contrast dye. -Continuesliding scale insulin with Accu-Cheks. -Modified carb diet  Cardiovascular issues:hypertension, Hyperlipidemia, CAD -PTA, patient was on aspirin, statin, atenolol, HCTZ, lisinopril. Continue all.  BPH -Cont Flomax  Arthritis - keep Meloxicam on hold.  Resumed Zanaflex and tramadol as needed.  Impaired mobility -Wife states that she does not have any help at home. PT evaluation appreciated. SNF recommended.Pending insurance authorization  Mobility:PT eval appreciated. Diet:Cardiac/diabetic diet DVT prophylaxis:Lovenox Code Status:Code Status: Full Code Family Communication: Expected Discharge:Medically stable for discharge. Pending insurance authorizing for SNF placement.  Antimicrobials:  Anti-infectives (From admission, onward)   Start     Dose/Rate Route Frequency Ordered Stop   12/22/18 2200  amoxicillin-clavulanate (AUGMENTIN) 875-125 MG per tablet 1 tablet     1 tablet Oral Every 12 hours 12/22/18 1558     12/20/18 2000  vancomycin (VANCOCIN) IVPB 1000 mg/200 mL premix  Status:  Discontinued     1,000 mg 200 mL/hr over 60  Minutes Intravenous Every 12 hours 12/20/18 1205 12/22/18 1035   12/20/18 1600  ceFEPIme (MAXIPIME) 2 g in sodium chloride 0.9 % 100 mL IVPB  Status:  Discontinued     2 g 200 mL/hr over 30 Minutes Intravenous Every 8 hours 12/20/18 0723 12/22/18 1558   12/20/18 0730  ceFEPIme (MAXIPIME) 2 g in sodium chloride 0.9 % 100 mL IVPB     2 g 200 mL/hr over 30 Minutes Intravenous  Once 12/20/18 0716 12/20/18 0828   12/20/18 0730  vancomycin (VANCOCIN) 2,500 mg in sodium chloride 0.9 % 500 mL IVPB  Status:  Discontinued     2,500 mg 250 mL/hr over 120 Minutes Intravenous Every 24 hours 12/20/18 0719 12/20/18 1205      Infusions:    Scheduled Meds: . allopurinol  300 mg Oral Daily  . amoxicillin-clavulanate  1 tablet Oral Q12H  . aspirin  325 mg Oral Daily  . atenolol  25 mg Oral Daily  . atorvastatin  40 mg Oral Daily  . enoxaparin (LOVENOX) injection  65 mg Subcutaneous Q24H  . hydrochlorothiazide  25 mg Oral Daily  . insulin aspart  0-5 Units Subcutaneous QHS  . insulin aspart  0-9 Units Subcutaneous TID WC  . lisinopril  40 mg Oral Daily  . loratadine  10 mg Oral Daily  . potassium chloride  20 mEq Oral Daily  . tamsulosin  0.4 mg Oral Daily    PRN meds: acetaminophen **OR** acetaminophen, diphenhydrAMINE, famotidine, LORazepam, prochlorperazine, sodium chloride flush, tiZANidine, traMADol   Objective: Vitals:   12/24/18 0527 12/24/18 0744  BP: (!) 145/94 (!) 159/98  Pulse: 93 95  Resp: 17 18  Temp: 98.1 F (36.7 C) 98.4 F (36.9 C)  SpO2: 99% 95%    Intake/Output Summary (Last 24 hours) at 12/24/2018 1131 Last data filed at 12/23/2018 1700 Gross per 24 hour  Intake 480 ml  Output -  Net 480 ml   Filed Weights   12/20/18 0243 12/23/18 0500 12/24/18 0600  Weight: 127 kg 130 kg 126.7 kg   Weight change: -3.3 kg Body mass index is 35.86 kg/m.   Physical Exam: General exam: Appears calm and comfortable.  Skin: No rashes, lesions or ulcers. HEENT: Atraumatic,  normocephalic, supple neck, no obvious bleeding Lungs: Clear to auscultation bilaterally CVS: Regular rate and rhythm, no murmur GI/Abd soft, nontender, nondistended, bowel sound present CNS: Alert, awake and oriented x3 Psychiatry: Mood appropriate Extremities: No pedal edema, no calf tenderness.  Left leg with bandage over the wound  Data Review: I have personally reviewed the laboratory data and studies available.  Recent Labs  Lab 12/19/18 0858 12/20/18 0252 12/21/18 0252 12/23/18 0400  WBC 6.7 9.6 7.1 5.0  NEUTROABS  --  6.9  --  2.8  HGB 12.4* 10.9* 10.7* 11.4*  HCT 34.7* 30.1* 29.6* 31.2*  MCV 86.8 86.2 85.8 85.2  PLT 230 212 213 242   Recent Labs  Lab 12/19/18 0858 12/20/18 0252 12/21/18 0252 12/23/18 0400  NA 138 136 137 140  K 4.4 3.8 3.4* 3.2*  CL 100 101 102 103  CO2 27 24 25 25   GLUCOSE 173* 192* 160* 149*  BUN 8 9 7* 10  CREATININE 1.11 1.17 0.96 0.95  CALCIUM 9.2 8.1* 8.7* 9.0    Terrilee Croak, MD  [facility] 12/24/2018

## 2018-12-24 NOTE — Plan of Care (Signed)
  Problem: Clinical Measurements: Goal: Cardiovascular complication will be avoided Outcome: Progressing   Problem: Activity: Goal: Risk for activity intolerance will decrease Outcome: Progressing   Problem: Coping: Goal: Level of anxiety will decrease Outcome: Progressing   Problem: Elimination: Goal: Will not experience complications related to bowel motility Outcome: Progressing   Problem: Safety: Goal: Ability to remain free from injury will improve Outcome: Progressing

## 2018-12-24 NOTE — Plan of Care (Signed)
  Problem: Coping: Goal: Level of anxiety will decrease Outcome: Progressing   Problem: Pain Managment: Goal: General experience of comfort will improve Outcome: Progressing   Problem: Safety: Goal: Ability to remain free from injury will improve Outcome: Progressing   Problem: Skin Integrity: Goal: Risk for impaired skin integrity will decrease Outcome: Progressing   Problem: Skin Integrity: Goal: Risk for impaired skin integrity will decrease Outcome: Progressing   

## 2018-12-24 NOTE — Progress Notes (Signed)
Patient c/o pain to left wrist, described as sharp. Given Tramadol as ordered prn at 1950. Patient stated the pain subsided. VSS. Will continue to monitor.

## 2018-12-25 ENCOUNTER — Encounter (HOSPITAL_COMMUNITY): Payer: Self-pay | Admitting: Orthopaedic Surgery

## 2018-12-25 DIAGNOSIS — G4733 Obstructive sleep apnea (adult) (pediatric): Secondary | ICD-10-CM | POA: Diagnosis not present

## 2018-12-25 DIAGNOSIS — I10 Essential (primary) hypertension: Secondary | ICD-10-CM | POA: Diagnosis not present

## 2018-12-25 DIAGNOSIS — S82892D Other fracture of left lower leg, subsequent encounter for closed fracture with routine healing: Secondary | ICD-10-CM | POA: Diagnosis not present

## 2018-12-25 DIAGNOSIS — R2689 Other abnormalities of gait and mobility: Secondary | ICD-10-CM | POA: Diagnosis not present

## 2018-12-25 DIAGNOSIS — Z7984 Long term (current) use of oral hypoglycemic drugs: Secondary | ICD-10-CM | POA: Diagnosis not present

## 2018-12-25 DIAGNOSIS — R590 Localized enlarged lymph nodes: Secondary | ICD-10-CM | POA: Diagnosis not present

## 2018-12-25 DIAGNOSIS — E1159 Type 2 diabetes mellitus with other circulatory complications: Secondary | ICD-10-CM | POA: Diagnosis not present

## 2018-12-25 DIAGNOSIS — S8262XG Displaced fracture of lateral malleolus of left fibula, subsequent encounter for closed fracture with delayed healing: Secondary | ICD-10-CM | POA: Diagnosis not present

## 2018-12-25 DIAGNOSIS — M2550 Pain in unspecified joint: Secondary | ICD-10-CM | POA: Diagnosis not present

## 2018-12-25 DIAGNOSIS — C9110 Chronic lymphocytic leukemia of B-cell type not having achieved remission: Secondary | ICD-10-CM | POA: Diagnosis not present

## 2018-12-25 DIAGNOSIS — M1990 Unspecified osteoarthritis, unspecified site: Secondary | ICD-10-CM | POA: Diagnosis not present

## 2018-12-25 DIAGNOSIS — Z79899 Other long term (current) drug therapy: Secondary | ICD-10-CM | POA: Diagnosis not present

## 2018-12-25 DIAGNOSIS — R2681 Unsteadiness on feet: Secondary | ICD-10-CM | POA: Diagnosis not present

## 2018-12-25 DIAGNOSIS — Z7982 Long term (current) use of aspirin: Secondary | ICD-10-CM | POA: Diagnosis not present

## 2018-12-25 DIAGNOSIS — K219 Gastro-esophageal reflux disease without esophagitis: Secondary | ICD-10-CM | POA: Diagnosis not present

## 2018-12-25 DIAGNOSIS — Z5111 Encounter for antineoplastic chemotherapy: Secondary | ICD-10-CM | POA: Diagnosis not present

## 2018-12-25 DIAGNOSIS — C8331 Diffuse large B-cell lymphoma, lymph nodes of head, face, and neck: Secondary | ICD-10-CM | POA: Diagnosis not present

## 2018-12-25 DIAGNOSIS — Z471 Aftercare following joint replacement surgery: Secondary | ICD-10-CM | POA: Diagnosis not present

## 2018-12-25 DIAGNOSIS — E119 Type 2 diabetes mellitus without complications: Secondary | ICD-10-CM | POA: Diagnosis not present

## 2018-12-25 DIAGNOSIS — C9190 Lymphoid leukemia, unspecified not having achieved remission: Secondary | ICD-10-CM | POA: Diagnosis not present

## 2018-12-25 DIAGNOSIS — R52 Pain, unspecified: Secondary | ICD-10-CM | POA: Diagnosis not present

## 2018-12-25 DIAGNOSIS — R509 Fever, unspecified: Secondary | ICD-10-CM | POA: Diagnosis not present

## 2018-12-25 DIAGNOSIS — C8338 Diffuse large B-cell lymphoma, lymph nodes of multiple sites: Secondary | ICD-10-CM | POA: Diagnosis not present

## 2018-12-25 DIAGNOSIS — Z7401 Bed confinement status: Secondary | ICD-10-CM | POA: Diagnosis not present

## 2018-12-25 DIAGNOSIS — Z791 Long term (current) use of non-steroidal anti-inflammatories (NSAID): Secondary | ICD-10-CM | POA: Diagnosis not present

## 2018-12-25 DIAGNOSIS — R5381 Other malaise: Secondary | ICD-10-CM | POA: Diagnosis not present

## 2018-12-25 DIAGNOSIS — Z5189 Encounter for other specified aftercare: Secondary | ICD-10-CM | POA: Diagnosis not present

## 2018-12-25 DIAGNOSIS — Z5112 Encounter for antineoplastic immunotherapy: Secondary | ICD-10-CM | POA: Diagnosis not present

## 2018-12-25 DIAGNOSIS — C8330 Diffuse large B-cell lymphoma, unspecified site: Secondary | ICD-10-CM | POA: Diagnosis not present

## 2018-12-25 DIAGNOSIS — M6281 Muscle weakness (generalized): Secondary | ICD-10-CM | POA: Diagnosis not present

## 2018-12-25 LAB — GLUCOSE, CAPILLARY
Glucose-Capillary: 150 mg/dL — ABNORMAL HIGH (ref 70–99)
Glucose-Capillary: 188 mg/dL — ABNORMAL HIGH (ref 70–99)
Glucose-Capillary: 159 mg/dL — ABNORMAL HIGH (ref 70–99)

## 2018-12-25 MED ORDER — HEPARIN SOD (PORK) LOCK FLUSH 100 UNIT/ML IV SOLN
500.0000 [IU] | INTRAVENOUS | Status: AC | PRN
  Administered 2018-12-25: 500 [IU]

## 2018-12-25 NOTE — Consult Note (Signed)
   [facility] CM Inpatient Consult   12/25/2018  Parris Cudworth March 17, 1946 [id number]   Patient screened for high risk score for unplanned readmission score and 2  hospitalizations and 3 ED visits in the past 6 months. Review of patient's medical record reveals patient is to transition today to [facility].  Patient with [facility] Medicare in [place].  Plan:  Patient for SNF, no [facility] follow up needs assessed at this time.    For questions contact:   Natividad Brood, RN BSN [facility] Liaison  [phone] business mobile phone Toll free office [phone]  Fax number: [phone] [email or web address]

## 2018-12-25 NOTE — Discharge Summary (Signed)
Physician Discharge Summary  Ronald Rice AVW:[id number] DOB: 1946/01/17 DOA: 12/20/2018  PCP: Deland Pretty, MD  Admit date: 12/20/2018 Discharge date: 12/25/2018  Admitted From: Home Discharge disposition: SNF   Code Status: Full Code   Recommendations for Outpatient Follow-Up:   1. Follow-up with oncology as an outpatient 2. Follow-up with orthopedics as outpatient.  Discharge Diagnosis:   Principal Problem:   Fever Active Problems:   Chronic lymphoid leukemia (HCC)   Type 2 diabetes mellitus without complications (HCC)   Essential (primary) hypertension   Hyperlipidemia   History of Present Illness / Brief narrative:  Patient is a 73 year old African-American male with history of hypertension, diabetes mellitus, CAD, GERD, diffuse large cell lymphoma, OSA who hadleftanklerevisionORIF revision on 7/28. At home, he had apostop fever and confusion. [facility] Over there, hislactic acid was elevated 2.53. Blood culture was collected. He was given IV Zosyn, IV vancomycin and 2 L of normal saline. [facility], temperature 98.9, heart rate 103, breathing comfortably on room air. WBC 9.6, hemoglobin 10.9, platelet 212 Sodium 136, creatinine 1.17  Urinalysis normal CT Angioof chest was negative for PE or thoracic aortic dissection and showed an overall decrease in left supraclavicular and mediastinal adenopathy. COVID-19 negative  Patient was admitted to hospitalist serviceand orthopedic consultation was called.No surgical intervention was required.  Subjective:  Patient was seen and examined this morning.  Pleasant elderly African-American male. Not in distress.  No new symptoms.  Hospital Course:  Postop fever with altered mental status -Presented with fever at home.Unknown severity. Associated with altered mental status. -Unclear source. CT Angioof  chest did not suggest any consolidation. Urinalysis normal. COVID-19 test negative. -Orthopedicsevaluated right ankle surgical site. No evidence of infection there. -Patient complained of left flank and left upper quadrant pain. He hasa history of multiple renal cyst.Renal ultrasound was obtained which does not show any evidence of pyelonephritis or any change in the status of the cysts. -Wound culture obtained [facility] ED records show any growth in3days. -WBC normal,no recurrence of fever in the hospital.  -Patient is currently onoral Augmentin to finish the course the next 3 days.  Acute delirium  -Likely precipitated by fever. Per patient's wife, patient tends to have confusion intermittently. -His mental statusis gradually improving. -He has normal to status for last 48 hours.  Recent left leg surgery -On 7/28, patient had revision ORIF of left ankle and removal of hardware. -Orthopedic follow-up appreciated. No evidence of wound infection at this time.  Patient has a splint on. Postoperative follow-up in 2 weeks. Nonweightbearing to the left lower extremity.  DM2 -Hemoglobin A1c 8. -Prior to admission, patient was on metformin.Resume metformin post discharge.-Continuesliding scale insulin with Accu-Cheks. -Modified carb diet  Cardiovascular issues:hypertension, Hyperlipidemia, CAD -PTA, patient was on aspirin, statin, atenolol, HCTZ, lisinopril. Continue all.  BPH -Cont Flomax  Arthritis - keep Meloxicam on hold.Continue Zanaflex and tramadol as needed.  Impaired mobility PT evaluation appreciated. SNF recommended.[facility]ed at [facility].  Stable for discharge to SNF today.   Discharge Exam:   Vitals:   12/24/18 1608 12/24/18 1950 12/25/18 0517 12/25/18 1026  BP: 127/81 122/86 119/87 140/84  Pulse: 86 90 93 93  Resp: 18 16 16 18   Temp: 98.3 F (36.8 C) 98 F (36.7 C) 98 F (36.7 C) 98.3 F (36.8 C)  TempSrc: Oral Oral Oral Oral   SpO2: 96% 96% 95% 96%  Weight:   126.9 kg   Height:  Body mass index is 35.92 kg/m.  General exam: Appears calm and comfortable.  Skin: No rashes, lesions or ulcers. HEENT: Atraumatic, normocephalic, supple neck, no obvious bleeding Lungs: Clear to auscultation bilaterally CVS: Regular rate and rhythm, no murmur GI/Abd soft, nondistended, nontender, bowel sound present CNS: Alert, awake, oriented x3 Psychiatry: Mood appropriate Extremities: Trace pedal edema on the right.  Left leg bandaged.  Discharge Instructions:  Wound care: Follow-up with orthopedics in 2 weeks. Discharge Instructions    Diet - low sodium heart healthy   Complete by: As directed    Increase activity slowly   Complete by: As directed      Follow-up Information    Erle Crocker, MD Follow up in 1 week(s).   Specialty: Orthopedic Surgery Why: Postop follow-up in 2 weeks Contact information: [place] [address] [phone]          Allergies as of 12/25/2018   No Known Allergies     Medication List    STOP taking these medications   Cialis 20 MG tablet Generic drug: tadalafil   famciclovir 500 MG tablet Commonly known as: FAMVIR   HYDROcodone-acetaminophen 5-325 MG tablet Commonly known as: NORCO/VICODIN   LORazepam 0.5 MG tablet Commonly known as: Ativan   meloxicam 15 MG tablet Commonly known as: MOBIC   oxyCODONE 5 MG immediate release tablet Commonly known as: Roxicodone     TAKE these medications   acetaminophen 500 MG tablet Commonly known as: TYLENOL Take 1,000 mg by mouth every 6 (six) hours as needed (pain).   allopurinol 300 MG tablet Commonly known as: ZYLOPRIM Take 300 mg by mouth daily.   amoxicillin-clavulanate 875-125 MG tablet Commonly known as: AUGMENTIN Take 1 tablet by mouth every 12 (twelve) hours for 3 days.   aspirin EC 81 MG tablet Take 81 mg by mouth daily. What changed: Another medication with the same name was  removed. Continue taking this medication, and follow the directions you see here.   atenolol 25 MG tablet Commonly known as: TENORMIN Take 25 mg by mouth daily.   atorvastatin 40 MG tablet Commonly known as: LIPITOR Take 40 mg by mouth daily.   CINNAMON PO Take 1 tablet by mouth daily.   diclofenac sodium 1 % Gel Commonly known as: VOLTAREN Apply 1 application topically as needed (pain).   famotidine 20 MG tablet Commonly known as: PEPCID Take 20 mg by mouth at bedtime as needed for heartburn.   hydrochlorothiazide 25 MG tablet Commonly known as: HYDRODIURIL Take 25 mg by mouth daily.   lidocaine-prilocaine cream Commonly known as: EMLA Apply to affected area once What changed:   how much to take  how to take this  when to take this  reasons to take this   lisinopril 40 MG tablet Commonly known as: ZESTRIL Take 1 tablet (40 mg total) by mouth daily for 30 days.   loratadine 10 MG tablet Commonly known as: CLARITIN Take 10 mg by mouth daily.   metFORMIN 500 MG 24 hr tablet Commonly known as: GLUCOPHAGE-XR Take 500 mg by mouth every evening.   NON FORMULARY Take 2 tablets by mouth daily. Blood Sugar Defense   ondansetron 8 MG tablet Commonly known as: Zofran Take 1 tablet (8 mg total) by mouth 2 (two) times daily as needed for refractory nausea / vomiting. Start on day 3 after cyclophosphamide chemotherapy.   potassium chloride 10 MEQ tablet Commonly known as: K-DUR Take 10 mEq by mouth daily.   predniSONE 20  MG tablet Commonly known as: DELTASONE Take 3 tablets (60 mg total) by mouth daily. Take on days 1-5 of chemotherapy.   prochlorperazine 10 MG tablet Commonly known as: COMPAZINE Take 1 tablet (10 mg total) by mouth every 6 (six) hours as needed for up to 5 days (Nausea or vomiting).   Simply Sleep 25 MG tablet Generic drug: diphenhydrAMINE Take 25 mg by mouth at bedtime as needed for sleep.   tamsulosin 0.4 MG Caps capsule Commonly known  as: FLOMAX Take 0.4 mg by mouth daily.   tiZANidine 4 MG tablet Commonly known as: ZANAFLEX Take 1 tablet (4 mg total) by mouth every 12 (twelve) hours as needed for up to 5 days for muscle spasms.   traMADol 50 MG tablet Commonly known as: ULTRAM Take 1 tablet (50 mg total) by mouth every 6 (six) hours as needed for up to 4 days for severe pain. What changed: reasons to take this       Time coordinating discharge: 35 minutes  The results of significant diagnostics from this hospitalization (including imaging, microbiology, ancillary and laboratory) are listed below for reference.    Procedures and Diagnostic Studies:   Ct Angio Chest Pe W Or Wo Contrast  Result Date: 12/20/2018 CLINICAL DATA:  Intermediate probability PE, positive D-dimer. History of diffuse large B-cell lymphoma. EXAM: CT ANGIOGRAPHY CHEST WITH CONTRAST TECHNIQUE: Multidetector CT imaging of the chest was performed using the standard protocol during bolus administration of intravenous contrast. Multiplanar CT image reconstructions and MIPs were obtained to evaluate the vascular anatomy. CONTRAST:  16mL OMNIPAQUE IOHEXOL 350 MG/ML SOLN COMPARISON:  10/27/2018 FINDINGS: Cardiovascular: Heart size normal. No pericardial effusion. Dilated central pulmonary arteries. Satisfactory opacification of pulmonary arteries noted, and there is no evidence of pulmonary emboli. Scattered coronary calcifications. Adequate contrast opacification of the thoracic aorta with no evidence of dissection, aneurysm, or stenosis. There is classic 3-vessel brachiocephalic arch anatomy without proximal stenosis. Mediastinum/Nodes: Bulky left supraclavicular lymph nodes seen previously have significantly decreased in size. Slight increase in AP window adenopathy. Precarinal and right paratracheal adenopathy stable. No hilar adenopathy. No hilar or mediastinal adenopathy. Lungs/Pleura: No significant pleural effusion. No pneumothorax. Linear atelectasis  or scarring in the left lower lobe slightly increased. Stable 6 mm subpleural nodule left lower lobe image 93/6. Upper Abdomen: Stable hepatic cysts. Bulky left renal cysts up to 14.6 cm as before. No acute findings. Musculoskeletal: Previous cement augmentation L1 vertebral body. Postop changes in the lower cervical spine. No acute fracture or worrisome bone lesion. Review of the MIP images confirms the above findings. IMPRESSION: 1. Negative for acute PE or thoracic aortic dissection. 2. Overall decrease in left supraclavicular and mediastinal adenopathy. 3. Coronary calcifications. The severity of coronary artery disease and any potential stenosis cannot be assessed on this non-gated CT examination. Assessment for potential risk factor modification, dietary therapy or pharmacologic therapy may be warranted, if clinically indicated. Electronically Signed   By: Lucrezia Europe M.D.   On: 12/20/2018 08:12   US Renal  Result Date: 12/21/2018 CLINICAL DATA:  Fever. EXAM: RENAL / URINARY TRACT ULTRASOUND COMPLETE COMPARISON:  CT chest, abdomen and pelvis 11/16/2018. Renal ultrasound 07/21/2018. FINDINGS: Right Kidney: Renal measurements: 12.1 x 5.3 x 5.9 cm = volume: 196.4 mL . Echogenicity within normal limits. No solid mass or hydronephrosis visualized. Three renal cysts measuring up to 5.4 cm in diameter are seen as on the prior exams. Left Kidney: Renal measurements: 29.2 x 15.1 x 15.5 = volume: 3,570 mL. Echogenicity within  normal limits. No solid mass or hydronephrosis visualized. Multiple large renal cysts again seen. Bladder: Appears normal for degree of bladder distention. IMPRESSION: Negative for hydronephrosis or acute abnormality. Bilateral renal cysts as seen on prior studies. Electronically Signed   By: Inge Rise M.D.   On: 12/21/2018 15:11   Dg Chest Port 1 View  Result Date: 12/20/2018 CLINICAL DATA:  Fever and confusion. EXAM: PORTABLE CHEST 1 VIEW COMPARISON:  CT 10/27/2018.  Portable chest  10/27/2018. FINDINGS: PowerPort catheter with tip over superior vena cava in stable position. Heart size normal. Low lung volumes. Mild left base atelectasis/infiltrate. No pleural effusion or pneumothorax. Prior cervical spine fusion. IMPRESSION: 1.  PowerPort catheter in stable position. 2.  Low lung volumes.  Mild left base atelectasis/infiltrate. Electronically Signed   By: Marcello Moores  Register   On: 12/20/2018 06:08     Labs:   Basic Metabolic Panel: Recent Labs  Lab 12/19/18 0858 12/20/18 0252 12/21/18 0252 12/23/18 0400  NA 138 136 137 140  K 4.4 3.8 3.4* 3.2*  CL 100 101 102 103  CO2 27 24 25 25   GLUCOSE 173* 192* 160* 149*  BUN 8 9 7* 10  CREATININE 1.11 1.17 0.96 0.95  CALCIUM 9.2 8.1* 8.7* 9.0   GFR Estimated Creatinine Clearance: 101 mL/min (by C-G formula based on SCr of 0.95 mg/dL). Liver Function Tests: Recent Labs  Lab 12/20/18 0557 12/21/18 0252  AST 24 20  ALT 24 20  ALKPHOS 46 47  BILITOT 1.1 1.1  PROT 6.0* 5.6*  ALBUMIN 3.4* 3.1*   No results for input(s): LIPASE, AMYLASE in the last 168 hours. No results for input(s): AMMONIA in the last 168 hours. Coagulation profile No results for input(s): INR, PROTIME in the last 168 hours.  CBC: Recent Labs  Lab 12/19/18 0858 12/20/18 0252 12/21/18 0252 12/23/18 0400  WBC 6.7 9.6 7.1 5.0  NEUTROABS  --  6.9  --  2.8  HGB 12.4* 10.9* 10.7* 11.4*  HCT 34.7* 30.1* 29.6* 31.2*  MCV 86.8 86.2 85.8 85.2  PLT 230 212 213 242   Cardiac Enzymes: No results for input(s): CKTOTAL, CKMB, CKMBINDEX, TROPONINI in the last 168 hours. BNP: Invalid input(s): POCBNP CBG: Recent Labs  Lab 12/24/18 0741 12/24/18 1123 12/24/18 1609 12/24/18 2122 12/25/18 0752  GLUCAP 135* 174* 167* 142* 150*   D-Dimer No results for input(s): DDIMER in the last 72 hours. Hgb A1c No results for input(s): HGBA1C in the last 72 hours. Lipid Profile No results for input(s): CHOL, HDL, LDLCALC, TRIG, CHOLHDL, LDLDIRECT in the last  72 hours. Thyroid function studies No results for input(s): TSH, T4TOTAL, T3FREE, THYROIDAB in the last 72 hours.  Invalid input(s): FREET3 Anemia work up No results for input(s): VITAMINB12, FOLATE, FERRITIN, TIBC, IRON, RETICCTPCT in the last 72 hours. Microbiology Recent Results (from the past 240 hour(s))  SARS Coronavirus 2 (CEPHEID - Performed in [place] hospital lab), Hosp Order     Status: None   Collection Time: 12/19/18  8:58 AM   Specimen: Nasopharyngeal Swab  Result Value Ref Range Status   SARS Coronavirus 2 NEGATIVE NEGATIVE Final    Comment: (NOTE) If result is NEGATIVE SARS-CoV-2 target nucleic acids are NOT DETECTED. The SARS-CoV-2 RNA is generally detectable in upper and lower  respiratory specimens during the acute phase of infection. The lowest  concentration of SARS-CoV-2 viral copies this assay can detect is 250  copies / mL. A negative result does not preclude SARS-CoV-2 infection  and should not  be used as the sole basis for treatment or other  patient management decisions.  A negative result may occur with  improper specimen collection / handling, submission of specimen other  than nasopharyngeal swab, presence of viral mutation(s) within the  areas targeted by this assay, and inadequate number of viral copies  (<250 copies / mL). A negative result must be combined with clinical  observations, patient history, and epidemiological information. If result is POSITIVE SARS-CoV-2 target nucleic acids are DETECTED. The SARS-CoV-2 RNA is generally detectable in upper and lower  respiratory specimens dur ing the acute phase of infection.  Positive  results are indicative of active infection with SARS-CoV-2.  Clinical  correlation with patient history and other diagnostic information is  necessary to determine patient infection status.  Positive results do  not rule out bacterial infection or co-infection with other viruses. If result is PRESUMPTIVE POSTIVE  SARS-CoV-2 nucleic acids MAY BE PRESENT.   A presumptive positive result was obtained on the submitted specimen  and confirmed on repeat testing.  While 2019 novel coronavirus  (SARS-CoV-2) nucleic acids may be present in the submitted sample  additional confirmatory testing may be necessary for epidemiological  and / or clinical management purposes  to differentiate between  SARS-CoV-2 and other Sarbecovirus currently known to infect humans.  If clinically indicated additional testing with an alternate test  methodology [phone]) is advised. The SARS-CoV-2 RNA is generally  detectable in upper and lower respiratory sp ecimens during the acute  phase of infection. The expected result is Negative. Fact Sheet for Patients:  StrictlyIdeas.no Fact Sheet for Healthcare Providers: BankingDealers.co.za This test is not yet approved or cleared by the [place] FDA and has been authorized for detection and/or diagnosis of SARS-CoV-2 by FDA under an Emergency Use Authorization (EUA).  This EUA will remain in effect (meaning this test can be used) for the duration of the COVID-19 declaration under Section 564(b)(1) of the Act, 21 U.S.C. section 360bbb-3(b)(1), unless the authorization is terminated or revoked sooner. Performed at [facility], [place] [address] [place], [place] [address]   SARS Coronavirus 2 (CEPHEID - Performed in [place] hospital lab), Hosp Order     Status: None   Collection Time: 12/20/18  6:30 AM   Specimen: Nasopharyngeal Swab  Result Value Ref Range Status   SARS Coronavirus 2 NEGATIVE NEGATIVE Final    Comment: (NOTE) If result is NEGATIVE SARS-CoV-2 target nucleic acids are NOT DETECTED. The SARS-CoV-2 RNA is generally detectable in upper and lower  respiratory specimens during the acute phase of infection. The lowest  concentration of SARS-CoV-2 viral copies this assay can detect is 250  copies / mL. A  negative result does not preclude SARS-CoV-2 infection  and should not be used as the sole basis for treatment or other  patient management decisions.  A negative result may occur with  improper specimen collection / handling, submission of specimen other  than nasopharyngeal swab, presence of viral mutation(s) within the  areas targeted by this assay, and inadequate number of viral copies  (<250 copies / mL). A negative result must be combined with clinical  observations, patient history, and epidemiological information. If result is POSITIVE SARS-CoV-2 target nucleic acids are DETECTED. The SARS-CoV-2 RNA is generally detectable in upper and lower  respiratory specimens dur ing the acute phase of infection.  Positive  results are indicative of active infection with SARS-CoV-2.  Clinical  correlation with patient history and other diagnostic information is  necessary to determine patient infection status.  Positive results do  not rule out bacterial infection or co-infection with other viruses. If result is PRESUMPTIVE POSTIVE SARS-CoV-2 nucleic acids MAY BE PRESENT.   A presumptive positive result was obtained on the submitted specimen  and confirmed on repeat testing.  While 2019 novel coronavirus  (SARS-CoV-2) nucleic acids may be present in the submitted sample  additional confirmatory testing may be necessary for epidemiological  and / or clinical management purposes  to differentiate between  SARS-CoV-2 and other Sarbecovirus currently known to infect humans.  If clinically indicated additional testing with an alternate test  methodology [phone]) is advised. The SARS-CoV-2 RNA is generally  detectable in upper and lower respiratory sp ecimens during the acute  phase of infection. The expected result is Negative. Fact Sheet for Patients:  StrictlyIdeas.no Fact Sheet for Healthcare Providers: BankingDealers.co.za This test is not  yet approved or cleared by the [place] FDA and has been authorized for detection and/or diagnosis of SARS-CoV-2 by FDA under an Emergency Use Authorization (EUA).  This EUA will remain in effect (meaning this test can be used) for the duration of the COVID-19 declaration under Section 564(b)(1) of the Act, 21 U.S.C. section 360bbb-3(b)(1), unless the authorization is terminated or revoked sooner. Performed at [facility], [place] [address] [place], [place] [address]   MRSA PCR Screening     Status: None   Collection Time: 12/21/18 12:54 PM   Specimen: Nasal Mucosa; Nasopharyngeal  Result Value Ref Range Status   MRSA by PCR NEGATIVE NEGATIVE Final    Comment:        The GeneXpert MRSA Assay (FDA approved for NASAL specimens only), is one component of a comprehensive MRSA colonization surveillance program. It is not intended to diagnose MRSA infection nor to guide or monitor treatment for MRSA infections. Performed at [facility], [place] [address] [place], [place] [address]     Signed: Terrilee Croak  [facility] 12/25/2018, 11:14 AM

## 2018-12-25 NOTE — Progress Notes (Signed)
Called [facility] charge RN for report & left message on voicemail; waiting for callback.

## 2018-12-25 NOTE — Progress Notes (Signed)
Gave report to [place] RN and they're ok with the negative Covid test dated 12/20/18.

## 2018-12-25 NOTE — Progress Notes (Signed)
Physical Therapy Treatment Patient Details Name: Ronald Rice MRN: [id number] DOB: Mar 26, 1946 Today's Date: 12/25/2018    History of Present Illness Pt is a 73 y/o male admitted secondary to fever. COVID test was negative. Pt recently had a L ankle ORIF on 12/19/18. PMH including but not limited to CLL on chemo, CAD, DM, HTN and original L ankle ORIF on 09/13/18.    PT Comments    Pt seated reclined in chair on arrival.  He wished to rest before d/c to rehab but agreeable to perform supine/reclined and seated exercises.  Pt tolerated exercises well but limited due to tightness and strength deficits.  Extensor lag noted during B SLRs.  Plan for d/c to SNF today to continue rehab.      Follow Up Recommendations  SNF;Supervision/Assistance - 24 hour     Equipment Recommendations  None recommended by PT    Recommendations for Other Services       Precautions / Restrictions Precautions Precautions: Fall Precaution Comments: hx of falls Restrictions Weight Bearing Restrictions: Yes LLE Weight Bearing: Non weight bearing    Mobility  Bed Mobility               General bed mobility comments: Refused mobility wishing to rest before d/c to SNF.  Transfers                    Ambulation/Gait                 Stairs             Wheelchair Mobility    Modified Rankin (Stroke Patients Only)       Balance                                            Cognition Arousal/Alertness: Awake/alert Behavior During Therapy: WFL for tasks assessed/performed Overall Cognitive Status: Impaired/Different from baseline Area of Impairment: Attention;Following commands;Memory;Safety/judgement;Problem solving                   Current Attention Level: Selective Memory: Decreased short-term memory;Decreased recall of precautions Following Commands: Follows one step commands consistently;Follows one step commands with increased  time Safety/Judgement: Decreased awareness of safety;Decreased awareness of deficits   Problem Solving: Slow processing;Decreased initiation;Difficulty sequencing;Requires verbal cues;Requires tactile cues        Exercises General Exercises - Lower Extremity Ankle Circles/Pumps: AROM;Right;20 reps;Supine(encouraged patient to wiggle L toes.) Quad Sets: AROM;Both;10 reps;Supine Long Arc Quad: AROM;Strengthening;10 reps;Seated;Both Heel Slides: AAROM;Both;10 reps;AROM;Supine Hip ABduction/ADduction: AAROM;Both;10 reps;Supine Straight Leg Raises: AAROM;Both;10 reps;Supine Hip Flexion/Marching: AROM;Both;10 reps;Seated    General Comments        Pertinent Vitals/Pain Pain Assessment: 0-10 Pain Score: 5  Pain Location: L ankle and calf Pain Descriptors / Indicators: Guarding;Sore Pain Intervention(s): Monitored during session;Repositioned    Home Living                      Prior Function            PT Goals (current goals can now be found in the care plan section) Acute Rehab PT Goals Patient Stated Goal: To go to rehab today. Potential to Achieve Goals: Good    Frequency    Min 3X/week      PT Plan Current plan remains appropriate    Co-evaluation  AM-PAC PT "6 Clicks" Mobility   Outcome Measure  Help needed turning from your back to your side while in a flat bed without using bedrails?: A Little Help needed moving from lying on your back to sitting on the side of a flat bed without using bedrails?: A Little Help needed moving to and from a bed to a chair (including a wheelchair)?: A Lot Help needed standing up from a chair using your arms (e.g., wheelchair or bedside chair)?: A Lot Help needed to walk in hospital room?: A Lot Help needed climbing 3-5 steps with a railing? : Total 6 Click Score: 13    End of Session Equipment Utilized During Treatment: Gait belt Activity Tolerance: Patient limited by fatigue;Patient limited by  pain Patient left: with call bell/phone within reach;in chair;with family/visitor present Nurse Communication: Mobility status;Precautions;Weight bearing status PT Visit Diagnosis: Other abnormalities of gait and mobility (R26.89)     Time: 1347-1400 PT Time Calculation (min) (ACUTE ONLY): 13 min  Charges:  $Therapeutic Exercise: 8-22 mins                     Governor Rooks, PTA Acute Rehabilitation Services Pager [phone] Office [phone]     Markella Dao Eli Hose 12/25/2018, 2:40 PM

## 2018-12-25 NOTE — Care Management Important Message (Signed)
Important Message  Patient Details  Name: King Pinzon MRN: [id number] Date of Birth: [birth year]   Medicare Important Message Given:  Yes     Shelda Altes 12/25/2018, 3:53 PM

## 2018-12-25 NOTE — TOC Transition Note (Signed)
Transition of Care [facility]) - CM/SW Discharge Note   Patient Details  Name: Ronald Rice MRN: [id number] Date of Birth: 1946/05/02  Transition of Care [facility] CM/SW Contact:  Gelene Mink, [place] Phone Number: 12/25/2018, 11:10 AM   Clinical Narrative:     Patient will DC to: [facility] Anticipated DC date: 12/25/2018 Family notified: Yes Transport by: Corey Harold   Per MD patient ready for DC to . RN, patient, patient's family, and facility notified of DC. Discharge Summary and FL2 sent to facility. RN to call report prior to discharge [phone] ). The patient will report to 7010.  DC packet on chart. Ambulance transport requested for patient.   CSW will sign off for now as social work intervention is no longer needed. Please consult [place] again if new needs arise.  Emett Stapel, LCSW-A [place]/Clinical Social Work Department Cell: [phone]    Final next level of care: [place] Barriers to Discharge: No Barriers Identified   Patient Goals and CMS Choice Patient states their goals for this hospitalization and ongoing recovery are:: Pt will go to rehab to complete therapy CMS Medicare.gov Compare Post Acute Care list provided to:: Patient Choice offered to / list presented to : Patient  Discharge Placement   Existing PASRR number confirmed : 12/21/18          Patient chooses bed at: [facility] Patient to be transferred to facility by: [place] Name of family member notified: [name] Patient and family notified of of transfer: 12/25/18  Discharge Plan and Services     Post Acute Care Choice: [place]          DME Arranged: N/A DME Agency: NA       HH Arranged: NA HH Agency: NA        Social Determinants of Health (SDOH) Interventions     Readmission Risk Interventions No flowsheet data found.

## 2018-12-26 ENCOUNTER — Telehealth: Payer: Self-pay | Admitting: Hematology & Oncology

## 2018-12-26 ENCOUNTER — Other Ambulatory Visit: Payer: Self-pay | Admitting: Hematology & Oncology

## 2018-12-26 ENCOUNTER — Other Ambulatory Visit: Payer: Self-pay | Admitting: *Deleted

## 2018-12-26 DIAGNOSIS — C8338 Diffuse large B-cell lymphoma, lymph nodes of multiple sites: Secondary | ICD-10-CM

## 2018-12-26 NOTE — Telephone Encounter (Signed)
Patient asked to leave a voicemail message to confirm r/s appt 8/12 at 0800 per 8/4 sch msg

## 2018-12-26 NOTE — Telephone Encounter (Signed)
Called and spoke with patient regarding PET scan date/time change per Dr Antonieta Pert order from 8/4.  He voiced understanding of this information as well

## 2018-12-26 NOTE — Patient Outreach (Signed)
[facility]) Care Management  12/26/2018  Ronald Rice 04/02/1946 [id number]  Case Closure  Hospital Liaison (Natividad Brood, RN) has indicated pt has discharged to a SNF and no longer a [facility] pt.   Case has been closed from further interventions.  Raina Mina, RN Care Management Coordinator [place] Office [phone]

## 2018-12-27 DIAGNOSIS — R509 Fever, unspecified: Secondary | ICD-10-CM | POA: Diagnosis not present

## 2018-12-27 DIAGNOSIS — S82892D Other fracture of left lower leg, subsequent encounter for closed fracture with routine healing: Secondary | ICD-10-CM | POA: Diagnosis not present

## 2018-12-27 DIAGNOSIS — C9110 Chronic lymphocytic leukemia of B-cell type not having achieved remission: Secondary | ICD-10-CM | POA: Diagnosis not present

## 2018-12-27 DIAGNOSIS — E1159 Type 2 diabetes mellitus with other circulatory complications: Secondary | ICD-10-CM | POA: Diagnosis not present

## 2019-01-01 ENCOUNTER — Telehealth: Payer: Self-pay | Admitting: *Deleted

## 2019-01-01 NOTE — Telephone Encounter (Signed)
Call received from patient's wife requesting to know how long pt.'s appt will be on 01/03/19 so that she can arrange pick up for patient with [facility].  Pt.'s wife notified that pt.'s treatment should be complete between approximately 3-4:00PM.  Pt.'s wife appreciative of assistance and has no further questions or concerns at this time.

## 2019-01-02 ENCOUNTER — Other Ambulatory Visit: Payer: Self-pay

## 2019-01-02 ENCOUNTER — Ambulatory Visit (HOSPITAL_COMMUNITY)
Admission: RE | Admit: 2019-01-02 | Discharge: 2019-01-02 | Disposition: A | Discharge status: Home / Self Care | Payer: Medicare HMO | Source: Ambulatory Visit | Attending: Hematology & Oncology | Admitting: Hematology & Oncology

## 2019-01-02 DIAGNOSIS — C8331 Diffuse large B-cell lymphoma, lymph nodes of head, face, and neck: Secondary | ICD-10-CM | POA: Diagnosis not present

## 2019-01-02 DIAGNOSIS — Z79899 Other long term (current) drug therapy: Secondary | ICD-10-CM | POA: Diagnosis not present

## 2019-01-02 DIAGNOSIS — C8338 Diffuse large B-cell lymphoma, lymph nodes of multiple sites: Secondary | ICD-10-CM | POA: Diagnosis not present

## 2019-01-02 DIAGNOSIS — R590 Localized enlarged lymph nodes: Secondary | ICD-10-CM | POA: Diagnosis not present

## 2019-01-02 DIAGNOSIS — S8262XG Displaced fracture of lateral malleolus of left fibula, subsequent encounter for closed fracture with delayed healing: Secondary | ICD-10-CM | POA: Diagnosis not present

## 2019-01-02 DIAGNOSIS — C8330 Diffuse large B-cell lymphoma, unspecified site: Secondary | ICD-10-CM | POA: Diagnosis not present

## 2019-01-02 LAB — GLUCOSE, CAPILLARY: Glucose-Capillary: 149 mg/dL — ABNORMAL HIGH (ref 70–99)

## 2019-01-02 MED ORDER — FLUDEOXYGLUCOSE F - 18 (FDG) INJECTION
13.7200 | Freq: Once | INTRAVENOUS | Status: AC | PRN
  Administered 2019-01-02: 13.72 via INTRAVENOUS

## 2019-01-02 NOTE — Progress Notes (Signed)
Called patient [facility]

## 2019-01-03 ENCOUNTER — Inpatient Hospital Stay (HOSPITAL_BASED_OUTPATIENT_CLINIC_OR_DEPARTMENT_OTHER): Payer: Medicare HMO | Admitting: Hematology & Oncology

## 2019-01-03 ENCOUNTER — Inpatient Hospital Stay: Payer: Medicare HMO | Attending: Hematology & Oncology

## 2019-01-03 ENCOUNTER — Other Ambulatory Visit: Payer: Self-pay

## 2019-01-03 ENCOUNTER — Telehealth: Payer: Self-pay | Admitting: Hematology & Oncology

## 2019-01-03 ENCOUNTER — Inpatient Hospital Stay: Payer: Medicare HMO

## 2019-01-03 ENCOUNTER — Encounter: Payer: Self-pay | Admitting: Hematology & Oncology

## 2019-01-03 VITALS — BP 116/80 | HR 87 | Temp 98.6°F | Resp 17

## 2019-01-03 VITALS — BP 136/85 | HR 90 | Temp 98.2°F | Resp 19

## 2019-01-03 DIAGNOSIS — C8338 Diffuse large B-cell lymphoma, lymph nodes of multiple sites: Secondary | ICD-10-CM

## 2019-01-03 DIAGNOSIS — Z5111 Encounter for antineoplastic chemotherapy: Secondary | ICD-10-CM | POA: Insufficient documentation

## 2019-01-03 DIAGNOSIS — Z791 Long term (current) use of non-steroidal anti-inflammatories (NSAID): Secondary | ICD-10-CM | POA: Insufficient documentation

## 2019-01-03 DIAGNOSIS — Z7984 Long term (current) use of oral hypoglycemic drugs: Secondary | ICD-10-CM | POA: Insufficient documentation

## 2019-01-03 DIAGNOSIS — Z7982 Long term (current) use of aspirin: Secondary | ICD-10-CM | POA: Insufficient documentation

## 2019-01-03 DIAGNOSIS — Z5189 Encounter for other specified aftercare: Secondary | ICD-10-CM | POA: Diagnosis not present

## 2019-01-03 DIAGNOSIS — Z5112 Encounter for antineoplastic immunotherapy: Secondary | ICD-10-CM | POA: Insufficient documentation

## 2019-01-03 DIAGNOSIS — Z79899 Other long term (current) drug therapy: Secondary | ICD-10-CM | POA: Insufficient documentation

## 2019-01-03 DIAGNOSIS — C9110 Chronic lymphocytic leukemia of B-cell type not having achieved remission: Secondary | ICD-10-CM | POA: Diagnosis not present

## 2019-01-03 LAB — CMP (CANCER CENTER ONLY)
ALT: 29 U/L (ref 0–44)
AST: 21 U/L (ref 15–41)
Albumin: 3.9 g/dL (ref 3.5–5.0)
Alkaline Phosphatase: 71 U/L (ref 38–126)
Anion gap: 11 (ref 5–15)
BUN: 17 mg/dL (ref 8–23)
CO2: 26 mmol/L (ref 22–32)
Calcium: 8.9 mg/dL (ref 8.9–10.3)
Chloride: 105 mmol/L (ref 98–111)
Creatinine: 1.03 mg/dL (ref 0.61–1.24)
GFR, Est AFR Am: >60 mL/min
GFR, Estimated: >60 mL/min
Glucose, Bld: 137 mg/dL — ABNORMAL HIGH (ref 70–99)
Potassium: 3.6 mmol/L (ref 3.5–5.1)
Sodium: 142 mmol/L (ref 135–145)
Total Bilirubin: 0.7 mg/dL (ref 0.3–1.2)
Total Protein: 6 g/dL — ABNORMAL LOW (ref 6.5–8.1)

## 2019-01-03 LAB — CBC WITH DIFFERENTIAL (CANCER CENTER ONLY)
Abs Immature Granulocytes: 0.02 10*3/uL (ref 0.00–0.07)
Basophils Absolute: 0.1 10*3/uL (ref 0.0–0.1)
Basophils Relative: 1 %
Eosinophils Absolute: 0.2 10*3/uL (ref 0.0–0.5)
Eosinophils Relative: 5 %
HCT: 36.4 % — ABNORMAL LOW (ref 39.0–52.0)
Hemoglobin: 12.8 g/dL — ABNORMAL LOW (ref 13.0–17.0)
Immature Granulocytes: 0 %
Lymphocytes Relative: 25 %
Lymphs Abs: 1.2 10*3/uL (ref 0.7–4.0)
MCH: 30.3 pg (ref 26.0–34.0)
MCHC: 35.2 g/dL (ref 30.0–36.0)
MCV: 86.1 fL (ref 80.0–100.0)
Monocytes Absolute: 1 10*3/uL (ref 0.1–1.0)
Monocytes Relative: 19 %
Neutro Abs: 2.5 10*3/uL (ref 1.7–7.7)
Neutrophils Relative %: 50 %
Platelet Count: 226 10*3/uL (ref 150–400)
RBC: 4.23 MIL/uL (ref 4.22–5.81)
RDW: 13.5 % (ref 11.5–15.5)
WBC Count: 5.1 10*3/uL (ref 4.0–10.5)
nRBC: 0 % (ref 0.0–0.2)

## 2019-01-03 LAB — LACTATE DEHYDROGENASE: LDH: 194 U/L — ABNORMAL HIGH (ref 98–192)

## 2019-01-03 MED ORDER — HEPARIN SOD (PORK) LOCK FLUSH 100 UNIT/ML IV SOLN
500.0000 [IU] | Freq: Once | INTRAVENOUS | Status: AC | PRN
  Administered 2019-01-03: 500 [IU]
  Filled 2019-01-03: qty 5

## 2019-01-03 MED ORDER — DIPHENHYDRAMINE HCL 25 MG PO CAPS
ORAL_CAPSULE | ORAL | Status: AC
  Filled 2019-01-03: qty 2

## 2019-01-03 MED ORDER — SODIUM CHLORIDE 0.9 % IV SOLN
375.0000 mg/m2 | Freq: Once | INTRAVENOUS | Status: DC
  Filled 2019-01-03: qty 100

## 2019-01-03 MED ORDER — LIDOCAINE-PRILOCAINE 2.5-2.5 % EX CREA: TOPICAL_CREAM | CUTANEOUS | 3 refills | Status: DC

## 2019-01-03 MED ORDER — VINCRISTINE SULFATE CHEMO INJECTION 1 MG/ML
1.1250 mg | Freq: Once | INTRAVENOUS | Status: AC
  Administered 2019-01-03: 1.1 mg via INTRAVENOUS
  Filled 2019-01-03: qty 1.1

## 2019-01-03 MED ORDER — SODIUM CHLORIDE 0.9 % IV SOLN
750.0000 mg/m2 | Freq: Once | INTRAVENOUS | Status: AC
  Administered 2019-01-03: 1960 mg via INTRAVENOUS
  Filled 2019-01-03: qty 98

## 2019-01-03 MED ORDER — SODIUM CHLORIDE 0.9% FLUSH
10.0000 mL | INTRAVENOUS | Status: DC | PRN
  Administered 2019-01-03: 10 mL
  Filled 2019-01-03: qty 10

## 2019-01-03 MED ORDER — PALONOSETRON HCL INJECTION 0.25 MG/5ML
0.2500 mg | Freq: Once | INTRAVENOUS | Status: AC
  Administered 2019-01-03: 0.25 mg via INTRAVENOUS

## 2019-01-03 MED ORDER — SODIUM CHLORIDE 0.9 % IV SOLN
Freq: Once | INTRAVENOUS | Status: AC
  Administered 2019-01-03: 10:00:00 via INTRAVENOUS
  Filled 2019-01-03: qty 5

## 2019-01-03 MED ORDER — DIPHENHYDRAMINE HCL 25 MG PO CAPS
50.0000 mg | ORAL_CAPSULE | Freq: Once | ORAL | Status: AC
  Administered 2019-01-03: 50 mg via ORAL

## 2019-01-03 MED ORDER — SODIUM CHLORIDE 0.9 % IV SOLN
Freq: Once | INTRAVENOUS | Status: AC
  Administered 2019-01-03: 10:00:00 via INTRAVENOUS
  Filled 2019-01-03: qty 250

## 2019-01-03 MED ORDER — PALONOSETRON HCL INJECTION 0.25 MG/5ML
INTRAVENOUS | Status: AC
  Filled 2019-01-03: qty 5

## 2019-01-03 MED ORDER — ACETAMINOPHEN 325 MG PO TABS
650.0000 mg | ORAL_TABLET | Freq: Once | ORAL | Status: AC
  Administered 2019-01-03: 650 mg via ORAL

## 2019-01-03 MED ORDER — SODIUM CHLORIDE 0.9 % IV SOLN
375.0000 mg/m2 | Freq: Once | INTRAVENOUS | Status: AC
  Administered 2019-01-03: 1000 mg via INTRAVENOUS
  Filled 2019-01-03: qty 100

## 2019-01-03 MED ORDER — ACETAMINOPHEN 325 MG PO TABS
ORAL_TABLET | ORAL | Status: AC
  Filled 2019-01-03: qty 2

## 2019-01-03 MED ORDER — DOXORUBICIN HCL CHEMO IV INJECTION 2 MG/ML
50.0000 mg/m2 | Freq: Once | INTRAVENOUS | Status: AC
  Administered 2019-01-03: 130 mg via INTRAVENOUS
  Filled 2019-01-03: qty 65

## 2019-01-03 NOTE — Telephone Encounter (Signed)
Appointments scheduled avs/calendar printed per 8/12 los °

## 2019-01-03 NOTE — Progress Notes (Signed)
Hematology and Oncology Follow Up Visit  Ronald Rice [id number] 28-Sep-1945 73 y.o. 01/03/2019   Principle Diagnosis:   Diffuse large cell non-Hodgkin's lymphoma-Richter's transformation from CLL  Current Therapy:    R-CHOP-s/p cycle #2 on 11/08/2018     Interim History:  Mr. Haye is back for for follow-up.   Unfortunately, he was in the hospital.  He rebroke his left foot.  He apparently fell.  He required surgery.  He cannot put any weight on the foot.  He had a PET scan done yesterday.  PET scan showed a very nice response.  The adenopathy in his neck now measures 3.3 x 2.7 cm.  The SUV went from 42 down to 3.5.  He has marked improvement in adenopathy elsewhere.  Basically, the the bulk of his lymphadenopathy is now resolved.  The SUV is below normal.  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  A lot of this is from his diabetes.  I am sure that the vincristine is not helping.  I will cut back on the vincristine dose a little bit.  He has had no fever.  He has had no rashes.  There is been no change in bowel or bladder habits.  He currently is at a rehab facility with his broken left leg.  His appetite is good.  He has had no bleeding.  He is on baby aspirin.    Overall, I would say his performance status is ECOG 1.  Medications:  Current Outpatient Medications:  .  acetaminophen (TYLENOL) 500 MG tablet, Take 1,000 mg by mouth every 6 (six) hours as needed (pain). , Disp: , Rfl:  .  allopurinol (ZYLOPRIM) 300 MG tablet, Take 300 mg by mouth daily., Disp: , Rfl:  .  aspirin EC 81 MG tablet, Take 81 mg by mouth daily., Disp: , Rfl:  .  atenolol (TENORMIN) 25 MG tablet, Take 25 mg by mouth daily. , Disp: , Rfl:  .  atorvastatin (LIPITOR) 40 MG tablet, Take 40 mg by mouth daily., Disp: , Rfl:  .  CINNAMON PO, Take 1 tablet by mouth daily., Disp: , Rfl:  .  diclofenac sodium (VOLTAREN) 1 % GEL, Apply 1 application topically as needed (pain). , Disp: , Rfl:  .  diphenhydrAMINE (SIMPLY  SLEEP) 25 MG tablet, Take 25 mg by mouth at bedtime as needed for sleep., Disp: , Rfl:  .  famotidine (PEPCID) 20 MG tablet, Take 20 mg by mouth at bedtime as needed for heartburn. , Disp: , Rfl:  .  hydrochlorothiazide (HYDRODIURIL) 25 MG tablet, Take 25 mg by mouth daily. , Disp: , Rfl:  .  lidocaine-prilocaine (EMLA) cream, Apply to PAC 1 hour prior to procedure., Disp: 30 g, Rfl: 3 .  lisinopril (ZESTRIL) 40 MG tablet, Take 1 tablet (40 mg total) by mouth daily for 30 days., Disp: 30 tablet, Rfl: 0 .  loratadine (CLARITIN) 10 MG tablet, Take 10 mg by mouth daily., Disp: , Rfl:  .  metFORMIN (GLUCOPHAGE-XR) 500 MG 24 hr tablet, Take 500 mg by mouth every evening. , Disp: , Rfl:  .  NON FORMULARY, Take 2 tablets by mouth daily. Blood Sugar Defense , Disp: , Rfl:  .  ondansetron (ZOFRAN) 8 MG tablet, Take 1 tablet (8 mg total) by mouth 2 (two) times daily as needed for refractory nausea / vomiting. Start on day 3 after cyclophosphamide chemotherapy., Disp: 30 tablet, Rfl: 1 .  potassium chloride (K-DUR) 10 MEQ tablet, Take 10 mEq by mouth daily. ,  Disp: , Rfl:  .  predniSONE (DELTASONE) 20 MG tablet, Take 3 tablets (60 mg total) by mouth daily. Take on days 1-5 of chemotherapy., Disp: 15 tablet, Rfl: 6 .  prochlorperazine (COMPAZINE) 10 MG tablet, Take 1 tablet (10 mg total) by mouth every 6 (six) hours as needed for up to 5 days (Nausea or vomiting)., Disp: 20 tablet, Rfl: 0 .  tamsulosin (FLOMAX) 0.4 MG CAPS capsule, Take 0.4 mg by mouth daily., Disp: , Rfl:   Allergies:  No Known Allergies  Past Medical History, Surgical history, Social history, and Family History were reviewed and updated.  Review of Systems: Review of Systems  Constitutional: Negative.   HENT:  Negative.   Eyes: Negative.   Respiratory: Negative.   Cardiovascular: Negative.   Gastrointestinal: Negative.   Endocrine: Negative.   Genitourinary: Positive for nocturia.   Musculoskeletal: Positive for arthralgias, gait  problem and myalgias.  Neurological: Positive for gait problem.  Hematological: Negative.   Psychiatric/Behavioral: Negative.     Physical Exam:  temporal temperature is 98.2 F (36.8 C). His blood pressure is 136/85 and his pulse is 90. His respiration is 19 and oxygen saturation is 99%.   Wt Readings from Last 3 Encounters:  12/25/18 279 lb 12.2 oz (126.9 kg)  12/19/18 279 lb 15.8 oz (127 kg)  12/13/18 280 lb (127 kg)    Physical Exam Vitals signs reviewed.  HENT:     Head: Normocephalic and atraumatic.  Eyes:     Pupils: Pupils are equal, round, and reactive to light.  Neck:     Comments: Examination of his neck shows marked improvement in the adenopathy in the left neck.  He still has some palpable posterior cervical lymphadenopathy.  It probably measures about 3 x 3 cm.  It is not as firm.  It is somewhat mobile.. Cardiovascular:     Rate and Rhythm: Normal rate and regular rhythm.     Heart sounds: Normal heart sounds.  Pulmonary:     Effort: Pulmonary effort is normal.     Breath sounds: Normal breath sounds.  Abdominal:     General: Bowel sounds are normal.     Palpations: Abdomen is soft.  Musculoskeletal: Normal range of motion.        General: No tenderness or deformity.     Comments: Extremities shows the cast on the left lower leg.  He has no issues with his right leg.  He has good pulses in his right leg.  Lymphadenopathy:     Cervical: No cervical adenopathy.  Skin:    General: Skin is warm and dry.     Findings: No erythema or rash.  Neurological:     Mental Status: He is alert and oriented to person, place, and time.  Psychiatric:        Behavior: Behavior normal.        Thought Content: Thought content normal.        Judgment: Judgment normal.      Lab Results  Component Value Date   WBC 5.1 01/03/2019   HGB 12.8 (L) 01/03/2019   HCT 36.4 (L) 01/03/2019   MCV 86.1 01/03/2019   PLT 226 01/03/2019     Chemistry      Component Value  Date/Time   NA 140 12/23/2018 0400   NA 142 01/25/2017 1217   K 3.2 (L) 12/23/2018 0400   K 4.1 01/25/2017 1217   CL 103 12/23/2018 0400   CL 102 01/25/2017 1217   CO2  25 12/23/2018 0400   CO2 33 01/25/2017 1217   BUN 10 12/23/2018 0400   BUN 12 01/25/2017 1217   CREATININE 0.95 12/23/2018 0400   CREATININE 1.04 11/29/2018 0835   CREATININE 1.4 (H) 01/25/2017 1217      Component Value Date/Time   CALCIUM 9.0 12/23/2018 0400   CALCIUM 9.7 01/25/2017 1217   ALKPHOS 47 12/21/2018 0252   ALKPHOS 59 01/25/2017 1217   AST 20 12/21/2018 0252   AST 22 11/29/2018 0835   ALT 20 12/21/2018 0252   ALT 25 11/29/2018 0835   ALT 32 01/25/2017 1217   BILITOT 1.1 12/21/2018 0252   BILITOT 0.5 11/29/2018 0835         Impression and Plan: Mr. Guo is a 73 year old African-American male.  He was diagnosed with CLL back in [place].  I think that the CLL probably has transformed to large cell lymphoma.  I would feel bad that he broke his left leg again.  I know this really is affecting his quality of life.  We will go ahead plan for his third cycle of treatment.  Again, we will have to watch out for this neuropathy.  I think that the PET scan is quite reassuring.  Again, he has had very nice resolution of the vast majority of his adenopathy.  The question now is whether or not we will need to do any radiation therapy to where he had the bulky disease in his neck lower dose of treatment.  We will plan to get him back in 3 more weeks.  I spent 30 minutes with him today.  Had to deal with his broken left foot and make adjustments with his chemotherapy and his medicines.    Volanda Napoleon, MD 8/12/20209:02 AM

## 2019-01-03 NOTE — Progress Notes (Signed)
Ok to proceed with Rituxan as a rapid Rituxan today per MD Ennever. Lattie Haw in pharmacy made aware.

## 2019-01-03 NOTE — Patient Instructions (Signed)
[facility] Discharge Instructions for Patients Receiving Chemotherapy  Today you received the following chemotherapy agents Adriamycin, Vincristine, cytoxan and Rituxan.  To help prevent nausea and vomiting after your treatment, we encourage you to take your nausea medication as directed BUT NO ZOFRAN FOR 3 DAYS POST CHEMO.   If you develop nausea and vomiting that is not controlled by your nausea medication, call the clinic.   BELOW ARE SYMPTOMS THAT SHOULD BE REPORTED IMMEDIATELY:  *FEVER GREATER THAN 100.5 F  *CHILLS WITH OR WITHOUT FEVER  NAUSEA AND VOMITING THAT IS NOT CONTROLLED WITH YOUR NAUSEA MEDICATION  *UNUSUAL SHORTNESS OF BREATH  *UNUSUAL BRUISING OR BLEEDING  TENDERNESS IN MOUTH AND THROAT WITH OR WITHOUT PRESENCE OF ULCERS  *URINARY PROBLEMS  *BOWEL PROBLEMS  UNUSUAL RASH Items with * indicate a potential emergency and should be followed up as soon as possible.  Feel free to call the clinic you have any questions or concerns. The clinic phone number is [phone].  Please show the [place] at check-in to the Emergency Department and triage nurse.

## 2019-01-05 ENCOUNTER — Inpatient Hospital Stay: Payer: Medicare HMO

## 2019-01-05 ENCOUNTER — Encounter (HOSPITAL_COMMUNITY): Payer: Medicare HMO

## 2019-01-05 ENCOUNTER — Other Ambulatory Visit: Payer: Self-pay

## 2019-01-05 VITALS — BP 132/78 | HR 80 | Temp 98.6°F | Resp 16

## 2019-01-05 DIAGNOSIS — Z7982 Long term (current) use of aspirin: Secondary | ICD-10-CM | POA: Diagnosis not present

## 2019-01-05 DIAGNOSIS — C9110 Chronic lymphocytic leukemia of B-cell type not having achieved remission: Secondary | ICD-10-CM | POA: Diagnosis not present

## 2019-01-05 DIAGNOSIS — Z5112 Encounter for antineoplastic immunotherapy: Secondary | ICD-10-CM | POA: Diagnosis not present

## 2019-01-05 DIAGNOSIS — Z79899 Other long term (current) drug therapy: Secondary | ICD-10-CM | POA: Diagnosis not present

## 2019-01-05 DIAGNOSIS — Z5111 Encounter for antineoplastic chemotherapy: Secondary | ICD-10-CM | POA: Diagnosis not present

## 2019-01-05 DIAGNOSIS — Z791 Long term (current) use of non-steroidal anti-inflammatories (NSAID): Secondary | ICD-10-CM | POA: Diagnosis not present

## 2019-01-05 DIAGNOSIS — Z7984 Long term (current) use of oral hypoglycemic drugs: Secondary | ICD-10-CM | POA: Diagnosis not present

## 2019-01-05 DIAGNOSIS — Z5189 Encounter for other specified aftercare: Secondary | ICD-10-CM | POA: Diagnosis not present

## 2019-01-05 DIAGNOSIS — C8338 Diffuse large B-cell lymphoma, lymph nodes of multiple sites: Secondary | ICD-10-CM

## 2019-01-05 MED ORDER — PEGFILGRASTIM-CBQV 6 MG/0.6ML ~~LOC~~ SOSY
6.0000 mg | PREFILLED_SYRINGE | Freq: Once | SUBCUTANEOUS | Status: AC
  Administered 2019-01-05: 09:00:00 6 mg via SUBCUTANEOUS

## 2019-01-05 MED ORDER — PEGFILGRASTIM-CBQV 6 MG/0.6ML ~~LOC~~ SOSY
PREFILLED_SYRINGE | SUBCUTANEOUS | Status: AC
  Filled 2019-01-05: qty 0.6

## 2019-01-05 NOTE — Patient Instructions (Signed)

## 2019-01-09 DIAGNOSIS — K219 Gastro-esophageal reflux disease without esophagitis: Secondary | ICD-10-CM | POA: Diagnosis not present

## 2019-01-09 DIAGNOSIS — I10 Essential (primary) hypertension: Secondary | ICD-10-CM | POA: Diagnosis not present

## 2019-01-09 DIAGNOSIS — N400 Enlarged prostate without lower urinary tract symptoms: Secondary | ICD-10-CM | POA: Diagnosis not present

## 2019-01-09 DIAGNOSIS — E119 Type 2 diabetes mellitus without complications: Secondary | ICD-10-CM | POA: Diagnosis not present

## 2019-01-09 DIAGNOSIS — M1990 Unspecified osteoarthritis, unspecified site: Secondary | ICD-10-CM | POA: Diagnosis not present

## 2019-01-09 DIAGNOSIS — S82891D Other fracture of right lower leg, subsequent encounter for closed fracture with routine healing: Secondary | ICD-10-CM | POA: Diagnosis not present

## 2019-01-09 DIAGNOSIS — I2510 Atherosclerotic heart disease of native coronary artery without angina pectoris: Secondary | ICD-10-CM | POA: Diagnosis not present

## 2019-01-09 DIAGNOSIS — G4733 Obstructive sleep apnea (adult) (pediatric): Secondary | ICD-10-CM | POA: Diagnosis not present

## 2019-01-09 DIAGNOSIS — C9110 Chronic lymphocytic leukemia of B-cell type not having achieved remission: Secondary | ICD-10-CM | POA: Diagnosis not present

## 2019-01-10 ENCOUNTER — Ambulatory Visit: Payer: Medicare HMO

## 2019-01-10 ENCOUNTER — Other Ambulatory Visit: Payer: Medicare HMO

## 2019-01-10 ENCOUNTER — Ambulatory Visit: Payer: Medicare HMO | Admitting: Hematology & Oncology

## 2019-01-12 ENCOUNTER — Ambulatory Visit: Payer: Medicare HMO

## 2019-01-13 DIAGNOSIS — S8262XA Displaced fracture of lateral malleolus of left fibula, initial encounter for closed fracture: Secondary | ICD-10-CM | POA: Diagnosis not present

## 2019-01-17 DIAGNOSIS — S8262XH Displaced fracture of lateral malleolus of left fibula, subsequent encounter for open fracture type I or II with delayed healing: Secondary | ICD-10-CM | POA: Diagnosis not present

## 2019-01-20 DIAGNOSIS — C9110 Chronic lymphocytic leukemia of B-cell type not having achieved remission: Secondary | ICD-10-CM | POA: Diagnosis not present

## 2019-01-20 DIAGNOSIS — K219 Gastro-esophageal reflux disease without esophagitis: Secondary | ICD-10-CM | POA: Diagnosis not present

## 2019-01-20 DIAGNOSIS — N400 Enlarged prostate without lower urinary tract symptoms: Secondary | ICD-10-CM | POA: Diagnosis not present

## 2019-01-20 DIAGNOSIS — M1990 Unspecified osteoarthritis, unspecified site: Secondary | ICD-10-CM | POA: Diagnosis not present

## 2019-01-20 DIAGNOSIS — I2510 Atherosclerotic heart disease of native coronary artery without angina pectoris: Secondary | ICD-10-CM | POA: Diagnosis not present

## 2019-01-20 DIAGNOSIS — E119 Type 2 diabetes mellitus without complications: Secondary | ICD-10-CM | POA: Diagnosis not present

## 2019-01-20 DIAGNOSIS — G4733 Obstructive sleep apnea (adult) (pediatric): Secondary | ICD-10-CM | POA: Diagnosis not present

## 2019-01-20 DIAGNOSIS — I10 Essential (primary) hypertension: Secondary | ICD-10-CM | POA: Diagnosis not present

## 2019-01-20 DIAGNOSIS — S82891D Other fracture of right lower leg, subsequent encounter for closed fracture with routine healing: Secondary | ICD-10-CM | POA: Diagnosis not present

## 2019-01-23 ENCOUNTER — Other Ambulatory Visit: Payer: Self-pay | Admitting: Hematology & Oncology

## 2019-01-23 ENCOUNTER — Other Ambulatory Visit: Payer: Self-pay | Admitting: Family

## 2019-01-23 DIAGNOSIS — C8338 Diffuse large B-cell lymphoma, lymph nodes of multiple sites: Secondary | ICD-10-CM

## 2019-01-24 ENCOUNTER — Inpatient Hospital Stay: Payer: Medicare HMO

## 2019-01-24 ENCOUNTER — Ambulatory Visit: Payer: Medicare HMO | Admitting: Hematology & Oncology

## 2019-01-24 ENCOUNTER — Other Ambulatory Visit: Payer: Self-pay

## 2019-01-24 ENCOUNTER — Inpatient Hospital Stay: Payer: Medicare HMO | Attending: Hematology & Oncology

## 2019-01-24 VITALS — BP 130/75 | HR 82 | Temp 98.2°F | Resp 16

## 2019-01-24 DIAGNOSIS — Z5111 Encounter for antineoplastic chemotherapy: Secondary | ICD-10-CM | POA: Insufficient documentation

## 2019-01-24 DIAGNOSIS — C8338 Diffuse large B-cell lymphoma, lymph nodes of multiple sites: Secondary | ICD-10-CM

## 2019-01-24 DIAGNOSIS — C9110 Chronic lymphocytic leukemia of B-cell type not having achieved remission: Secondary | ICD-10-CM | POA: Diagnosis not present

## 2019-01-24 DIAGNOSIS — Z5112 Encounter for antineoplastic immunotherapy: Secondary | ICD-10-CM | POA: Diagnosis not present

## 2019-01-24 DIAGNOSIS — Z5189 Encounter for other specified aftercare: Secondary | ICD-10-CM | POA: Insufficient documentation

## 2019-01-24 LAB — CBC WITH DIFFERENTIAL (CANCER CENTER ONLY)
Abs Immature Granulocytes: 0.03 10*3/uL (ref 0.00–0.07)
Basophils Absolute: 0.1 10*3/uL (ref 0.0–0.1)
Basophils Relative: 1 %
Eosinophils Absolute: 0.1 10*3/uL (ref 0.0–0.5)
Eosinophils Relative: 2 %
HCT: 34.1 % — ABNORMAL LOW (ref 39.0–52.0)
Hemoglobin: 12 g/dL — ABNORMAL LOW (ref 13.0–17.0)
Immature Granulocytes: 1 %
Lymphocytes Relative: 18 %
Lymphs Abs: 0.7 10*3/uL (ref 0.7–4.0)
MCH: 30.7 pg (ref 26.0–34.0)
MCHC: 35.2 g/dL (ref 30.0–36.0)
MCV: 87.2 fL (ref 80.0–100.0)
Monocytes Absolute: 0.5 10*3/uL (ref 0.1–1.0)
Monocytes Relative: 13 %
Neutro Abs: 2.6 10*3/uL (ref 1.7–7.7)
Neutrophils Relative %: 65 %
Platelet Count: 208 10*3/uL (ref 150–400)
RBC: 3.91 MIL/uL — ABNORMAL LOW (ref 4.22–5.81)
RDW: 14.5 % (ref 11.5–15.5)
WBC Count: 4 10*3/uL (ref 4.0–10.5)
nRBC: 0 % (ref 0.0–0.2)

## 2019-01-24 LAB — CMP (CANCER CENTER ONLY)
ALT: 24 U/L (ref 0–44)
AST: 18 U/L (ref 15–41)
Albumin: 4 g/dL (ref 3.5–5.0)
Alkaline Phosphatase: 50 U/L (ref 38–126)
Anion gap: 11 (ref 5–15)
BUN: 12 mg/dL (ref 8–23)
CO2: 25 mmol/L (ref 22–32)
Calcium: 8.9 mg/dL (ref 8.9–10.3)
Chloride: 105 mmol/L (ref 98–111)
Creatinine: 1.09 mg/dL (ref 0.61–1.24)
GFR, Est AFR Am: >60 mL/min (ref >60)
GFR, Estimated: >60 mL/min (ref >60)
Glucose, Bld: 257 mg/dL — ABNORMAL HIGH (ref 70–99)
Potassium: 3.5 mmol/L (ref 3.5–5.1)
Sodium: 141 mmol/L (ref 135–145)
Total Bilirubin: 0.4 mg/dL (ref 0.3–1.2)
Total Protein: 6.2 g/dL — ABNORMAL LOW (ref 6.5–8.1)

## 2019-01-24 MED ORDER — SODIUM CHLORIDE 0.9 % IV SOLN
375.0000 mg/m2 | Freq: Once | INTRAVENOUS | Status: AC
  Administered 2019-01-24: 1000 mg via INTRAVENOUS
  Filled 2019-01-24: qty 100

## 2019-01-24 MED ORDER — HEPARIN SOD (PORK) LOCK FLUSH 100 UNIT/ML IV SOLN
500.0000 [IU] | Freq: Once | INTRAVENOUS | Status: AC | PRN
  Administered 2019-01-24: 500 [IU]
  Filled 2019-01-24: qty 5

## 2019-01-24 MED ORDER — SODIUM CHLORIDE 0.9% FLUSH
10.0000 mL | INTRAVENOUS | Status: DC | PRN
  Administered 2019-01-24: 10 mL
  Filled 2019-01-24: qty 10

## 2019-01-24 MED ORDER — PALONOSETRON HCL INJECTION 0.25 MG/5ML
INTRAVENOUS | Status: AC
  Filled 2019-01-24: qty 5

## 2019-01-24 MED ORDER — VINCRISTINE SULFATE CHEMO INJECTION 1 MG/ML
1.0000 mg | Freq: Once | INTRAVENOUS | Status: AC
  Administered 2019-01-24: 1 mg via INTRAVENOUS
  Filled 2019-01-24: qty 1

## 2019-01-24 MED ORDER — DIPHENHYDRAMINE HCL 25 MG PO CAPS
ORAL_CAPSULE | ORAL | Status: AC
  Filled 2019-01-24: qty 2

## 2019-01-24 MED ORDER — PALONOSETRON HCL INJECTION 0.25 MG/5ML
0.2500 mg | Freq: Once | INTRAVENOUS | Status: AC
  Administered 2019-01-24: 0.25 mg via INTRAVENOUS

## 2019-01-24 MED ORDER — SODIUM CHLORIDE 0.9 % IV SOLN
750.0000 mg/m2 | Freq: Once | INTRAVENOUS | Status: AC
  Administered 2019-01-24: 1960 mg via INTRAVENOUS
  Filled 2019-01-24: qty 98

## 2019-01-24 MED ORDER — SODIUM CHLORIDE 0.9 % IV SOLN
Freq: Once | INTRAVENOUS | Status: AC
  Administered 2019-01-24: 10:00:00 via INTRAVENOUS
  Filled 2019-01-24: qty 5

## 2019-01-24 MED ORDER — ACETAMINOPHEN 325 MG PO TABS
ORAL_TABLET | ORAL | Status: AC
  Filled 2019-01-24: qty 2

## 2019-01-24 MED ORDER — DOXORUBICIN HCL CHEMO IV INJECTION 2 MG/ML
50.0000 mg/m2 | Freq: Once | INTRAVENOUS | Status: AC
  Administered 2019-01-24: 130 mg via INTRAVENOUS
  Filled 2019-01-24: qty 65

## 2019-01-24 MED ORDER — DIPHENHYDRAMINE HCL 25 MG PO CAPS
50.0000 mg | ORAL_CAPSULE | Freq: Once | ORAL | Status: AC
  Administered 2019-01-24: 50 mg via ORAL

## 2019-01-24 MED ORDER — SODIUM CHLORIDE 0.9 % IV SOLN
Freq: Once | INTRAVENOUS | Status: AC
  Administered 2019-01-24: 10:00:00 via INTRAVENOUS
  Filled 2019-01-24: qty 250

## 2019-01-24 MED ORDER — ACETAMINOPHEN 325 MG PO TABS
650.0000 mg | ORAL_TABLET | Freq: Once | ORAL | Status: AC
  Administered 2019-01-24: 650 mg via ORAL

## 2019-01-24 NOTE — Patient Instructions (Signed)
[facility] Discharge Instructions for Patients Receiving Chemotherapy  Today you received the following chemotherapy agents Adriamycin, Vincristine, cytoxan and Rituxan.  To help prevent nausea and vomiting after your treatment, we encourage you to take your nausea medication as directed BUT NO ZOFRAN FOR 3 DAYS POST CHEMO.   If you develop nausea and vomiting that is not controlled by your nausea medication, call the clinic.   BELOW ARE SYMPTOMS THAT SHOULD BE REPORTED IMMEDIATELY:  *FEVER GREATER THAN 100.5 F  *CHILLS WITH OR WITHOUT FEVER  NAUSEA AND VOMITING THAT IS NOT CONTROLLED WITH YOUR NAUSEA MEDICATION  *UNUSUAL SHORTNESS OF BREATH  *UNUSUAL BRUISING OR BLEEDING  TENDERNESS IN MOUTH AND THROAT WITH OR WITHOUT PRESENCE OF ULCERS  *URINARY PROBLEMS  *BOWEL PROBLEMS  UNUSUAL RASH Items with * indicate a potential emergency and should be followed up as soon as possible.  Feel free to call the clinic you have any questions or concerns. The clinic phone number is [phone].  Please show the CHEMO ALERT CARD at check-in to the Emergency Department and triage nurse.    

## 2019-01-24 NOTE — Patient Instructions (Signed)
Implanted Port Insertion, Care After This sheet gives you information about how to care for yourself after your procedure. Your health care provider may also give you more specific instructions. If you have problems or questions, contact your health care provider. What can I expect after the procedure? After the procedure, it is common to have:  Discomfort at the port insertion site.  Bruising on the skin over the port. This should improve over 3-4 days. Follow these instructions at home: Port care  After your port is placed, you will get a manufacturer's information card. The card has information about your port. Keep this card with you at all times.  Take care of the port as told by your health care provider. Ask your health care provider if you or a family member can get training for taking care of the port at home. A home health care nurse may also take care of the port.  Make sure to remember what type of port you have. Incision care      Follow instructions from your health care provider about how to take care of your port insertion site. Make sure you: ? Wash your hands with soap and water before and after you change your bandage (dressing). If soap and water are not available, use hand sanitizer. ? Change your dressing as told by your health care provider. ? Leave stitches (sutures), skin glue, or adhesive strips in place. These skin closures may need to stay in place for 2 weeks or longer. If adhesive strip edges start to loosen and curl up, you may trim the loose edges. Do not remove adhesive strips completely unless your health care provider tells you to do that.  Check your port insertion site every day for signs of infection. Check for: ? Redness, swelling, or pain. ? Fluid or blood. ? Warmth. ? Pus or a bad smell. Activity  Return to your normal activities as told by your health care provider. Ask your health care provider what activities are safe for you.  Do not  lift anything that is heavier than 10 lb (4.5 kg), or the limit that you are told, until your health care provider says that it is safe. General instructions  Take over-the-counter and prescription medicines only as told by your health care provider.  Do not take baths, swim, or use a hot tub until your health care provider approves. Ask your health care provider if you may take showers. You may only be allowed to take sponge baths.  Do not drive for 24 hours if you were given a sedative during your procedure.  Wear a medical alert bracelet in case of an emergency. This will tell any health care providers that you have a port.  Keep all follow-up visits as told by your health care provider. This is important. Contact a health care provider if:  You cannot flush your port with saline as directed, or you cannot draw blood from the port.  You have a fever or chills.  You have redness, swelling, or pain around your port insertion site.  You have fluid or blood coming from your port insertion site.  Your port insertion site feels warm to the touch.  You have pus or a bad smell coming from the port insertion site. Get help right away if:  You have chest pain or shortness of breath.  You have bleeding from your port that you cannot control. Summary  Take care of the port as told by your health   care provider. Keep the manufacturer's information card with you at all times.  Change your dressing as told by your health care provider.  Contact a health care provider if you have a fever or chills or if you have redness, swelling, or pain around your port insertion site.  Keep all follow-up visits as told by your health care provider. This information is not intended to replace advice given to you by your health care provider. Make sure you discuss any questions you have with your health care provider. Document Released: 02/28/2013 Document Revised: 12/06/2017 Document Reviewed: 12/06/2017  Elsevier Patient Education  2020 [employer]  

## 2019-01-24 NOTE — Progress Notes (Signed)
Reviewed all labwork with [facility] including blood glucose.  Ok to treat today

## 2019-01-25 DIAGNOSIS — I10 Essential (primary) hypertension: Secondary | ICD-10-CM | POA: Diagnosis not present

## 2019-01-25 DIAGNOSIS — I2510 Atherosclerotic heart disease of native coronary artery without angina pectoris: Secondary | ICD-10-CM | POA: Diagnosis not present

## 2019-01-25 DIAGNOSIS — G4733 Obstructive sleep apnea (adult) (pediatric): Secondary | ICD-10-CM | POA: Diagnosis not present

## 2019-01-25 DIAGNOSIS — M1990 Unspecified osteoarthritis, unspecified site: Secondary | ICD-10-CM | POA: Diagnosis not present

## 2019-01-25 DIAGNOSIS — N400 Enlarged prostate without lower urinary tract symptoms: Secondary | ICD-10-CM | POA: Diagnosis not present

## 2019-01-25 DIAGNOSIS — K219 Gastro-esophageal reflux disease without esophagitis: Secondary | ICD-10-CM | POA: Diagnosis not present

## 2019-01-25 DIAGNOSIS — E119 Type 2 diabetes mellitus without complications: Secondary | ICD-10-CM | POA: Diagnosis not present

## 2019-01-25 DIAGNOSIS — C9110 Chronic lymphocytic leukemia of B-cell type not having achieved remission: Secondary | ICD-10-CM | POA: Diagnosis not present

## 2019-01-25 DIAGNOSIS — S82891D Other fracture of right lower leg, subsequent encounter for closed fracture with routine healing: Secondary | ICD-10-CM | POA: Diagnosis not present

## 2019-01-26 ENCOUNTER — Other Ambulatory Visit: Payer: Self-pay

## 2019-01-26 ENCOUNTER — Inpatient Hospital Stay: Payer: Medicare HMO

## 2019-01-26 VITALS — BP 104/62 | HR 92 | Temp 97.9°F | Resp 16

## 2019-01-26 DIAGNOSIS — Z5112 Encounter for antineoplastic immunotherapy: Secondary | ICD-10-CM | POA: Diagnosis not present

## 2019-01-26 DIAGNOSIS — Z5189 Encounter for other specified aftercare: Secondary | ICD-10-CM | POA: Diagnosis not present

## 2019-01-26 DIAGNOSIS — Z5111 Encounter for antineoplastic chemotherapy: Secondary | ICD-10-CM | POA: Diagnosis not present

## 2019-01-26 DIAGNOSIS — C9110 Chronic lymphocytic leukemia of B-cell type not having achieved remission: Secondary | ICD-10-CM | POA: Diagnosis not present

## 2019-01-26 DIAGNOSIS — C8338 Diffuse large B-cell lymphoma, lymph nodes of multiple sites: Secondary | ICD-10-CM

## 2019-01-26 MED ORDER — PEGFILGRASTIM-CBQV 6 MG/0.6ML ~~LOC~~ SOSY
6.0000 mg | PREFILLED_SYRINGE | Freq: Once | SUBCUTANEOUS | Status: AC
  Administered 2019-01-26: 6 mg via SUBCUTANEOUS

## 2019-01-29 DIAGNOSIS — E119 Type 2 diabetes mellitus without complications: Secondary | ICD-10-CM | POA: Diagnosis not present

## 2019-01-29 DIAGNOSIS — I2510 Atherosclerotic heart disease of native coronary artery without angina pectoris: Secondary | ICD-10-CM | POA: Diagnosis not present

## 2019-01-29 DIAGNOSIS — K219 Gastro-esophageal reflux disease without esophagitis: Secondary | ICD-10-CM | POA: Diagnosis not present

## 2019-01-29 DIAGNOSIS — S82891D Other fracture of right lower leg, subsequent encounter for closed fracture with routine healing: Secondary | ICD-10-CM | POA: Diagnosis not present

## 2019-01-29 DIAGNOSIS — I10 Essential (primary) hypertension: Secondary | ICD-10-CM | POA: Diagnosis not present

## 2019-01-29 DIAGNOSIS — M1990 Unspecified osteoarthritis, unspecified site: Secondary | ICD-10-CM | POA: Diagnosis not present

## 2019-01-29 DIAGNOSIS — C9110 Chronic lymphocytic leukemia of B-cell type not having achieved remission: Secondary | ICD-10-CM | POA: Diagnosis not present

## 2019-01-29 DIAGNOSIS — G4733 Obstructive sleep apnea (adult) (pediatric): Secondary | ICD-10-CM | POA: Diagnosis not present

## 2019-01-29 DIAGNOSIS — N400 Enlarged prostate without lower urinary tract symptoms: Secondary | ICD-10-CM | POA: Diagnosis not present

## 2019-01-31 DIAGNOSIS — M1990 Unspecified osteoarthritis, unspecified site: Secondary | ICD-10-CM | POA: Diagnosis not present

## 2019-01-31 DIAGNOSIS — N400 Enlarged prostate without lower urinary tract symptoms: Secondary | ICD-10-CM | POA: Diagnosis not present

## 2019-01-31 DIAGNOSIS — I10 Essential (primary) hypertension: Secondary | ICD-10-CM | POA: Diagnosis not present

## 2019-01-31 DIAGNOSIS — I2510 Atherosclerotic heart disease of native coronary artery without angina pectoris: Secondary | ICD-10-CM | POA: Diagnosis not present

## 2019-01-31 DIAGNOSIS — G4733 Obstructive sleep apnea (adult) (pediatric): Secondary | ICD-10-CM | POA: Diagnosis not present

## 2019-01-31 DIAGNOSIS — C9110 Chronic lymphocytic leukemia of B-cell type not having achieved remission: Secondary | ICD-10-CM | POA: Diagnosis not present

## 2019-01-31 DIAGNOSIS — K219 Gastro-esophageal reflux disease without esophagitis: Secondary | ICD-10-CM | POA: Diagnosis not present

## 2019-01-31 DIAGNOSIS — E119 Type 2 diabetes mellitus without complications: Secondary | ICD-10-CM | POA: Diagnosis not present

## 2019-01-31 DIAGNOSIS — S82891D Other fracture of right lower leg, subsequent encounter for closed fracture with routine healing: Secondary | ICD-10-CM | POA: Diagnosis not present

## 2019-02-05 DIAGNOSIS — G4733 Obstructive sleep apnea (adult) (pediatric): Secondary | ICD-10-CM | POA: Diagnosis not present

## 2019-02-06 DIAGNOSIS — N400 Enlarged prostate without lower urinary tract symptoms: Secondary | ICD-10-CM | POA: Diagnosis not present

## 2019-02-06 DIAGNOSIS — G4733 Obstructive sleep apnea (adult) (pediatric): Secondary | ICD-10-CM | POA: Diagnosis not present

## 2019-02-06 DIAGNOSIS — C9110 Chronic lymphocytic leukemia of B-cell type not having achieved remission: Secondary | ICD-10-CM | POA: Diagnosis not present

## 2019-02-06 DIAGNOSIS — K219 Gastro-esophageal reflux disease without esophagitis: Secondary | ICD-10-CM | POA: Diagnosis not present

## 2019-02-06 DIAGNOSIS — I2510 Atherosclerotic heart disease of native coronary artery without angina pectoris: Secondary | ICD-10-CM | POA: Diagnosis not present

## 2019-02-06 DIAGNOSIS — S82891D Other fracture of right lower leg, subsequent encounter for closed fracture with routine healing: Secondary | ICD-10-CM | POA: Diagnosis not present

## 2019-02-06 DIAGNOSIS — I10 Essential (primary) hypertension: Secondary | ICD-10-CM | POA: Diagnosis not present

## 2019-02-06 DIAGNOSIS — E119 Type 2 diabetes mellitus without complications: Secondary | ICD-10-CM | POA: Diagnosis not present

## 2019-02-06 DIAGNOSIS — M1990 Unspecified osteoarthritis, unspecified site: Secondary | ICD-10-CM | POA: Diagnosis not present

## 2019-02-13 DIAGNOSIS — K219 Gastro-esophageal reflux disease without esophagitis: Secondary | ICD-10-CM | POA: Diagnosis not present

## 2019-02-13 DIAGNOSIS — C9110 Chronic lymphocytic leukemia of B-cell type not having achieved remission: Secondary | ICD-10-CM | POA: Diagnosis not present

## 2019-02-13 DIAGNOSIS — S8262XA Displaced fracture of lateral malleolus of left fibula, initial encounter for closed fracture: Secondary | ICD-10-CM | POA: Diagnosis not present

## 2019-02-13 DIAGNOSIS — I2510 Atherosclerotic heart disease of native coronary artery without angina pectoris: Secondary | ICD-10-CM | POA: Diagnosis not present

## 2019-02-13 DIAGNOSIS — G4733 Obstructive sleep apnea (adult) (pediatric): Secondary | ICD-10-CM | POA: Diagnosis not present

## 2019-02-13 DIAGNOSIS — N400 Enlarged prostate without lower urinary tract symptoms: Secondary | ICD-10-CM | POA: Diagnosis not present

## 2019-02-13 DIAGNOSIS — M1990 Unspecified osteoarthritis, unspecified site: Secondary | ICD-10-CM | POA: Diagnosis not present

## 2019-02-13 DIAGNOSIS — S82891D Other fracture of right lower leg, subsequent encounter for closed fracture with routine healing: Secondary | ICD-10-CM | POA: Diagnosis not present

## 2019-02-13 DIAGNOSIS — I10 Essential (primary) hypertension: Secondary | ICD-10-CM | POA: Diagnosis not present

## 2019-02-13 DIAGNOSIS — E119 Type 2 diabetes mellitus without complications: Secondary | ICD-10-CM | POA: Diagnosis not present

## 2019-02-14 ENCOUNTER — Inpatient Hospital Stay: Payer: Medicare HMO

## 2019-02-14 ENCOUNTER — Other Ambulatory Visit: Payer: Self-pay

## 2019-02-14 ENCOUNTER — Inpatient Hospital Stay (HOSPITAL_BASED_OUTPATIENT_CLINIC_OR_DEPARTMENT_OTHER): Payer: Medicare HMO | Admitting: Hematology & Oncology

## 2019-02-14 ENCOUNTER — Encounter: Payer: Self-pay | Admitting: Hematology & Oncology

## 2019-02-14 VITALS — BP 117/77 | HR 83 | Temp 97.1°F | Resp 18

## 2019-02-14 VITALS — BP 131/84 | HR 91 | Temp 97.1°F | Resp 18

## 2019-02-14 DIAGNOSIS — C9110 Chronic lymphocytic leukemia of B-cell type not having achieved remission: Secondary | ICD-10-CM | POA: Diagnosis not present

## 2019-02-14 DIAGNOSIS — Z5112 Encounter for antineoplastic immunotherapy: Secondary | ICD-10-CM | POA: Diagnosis not present

## 2019-02-14 DIAGNOSIS — C8338 Diffuse large B-cell lymphoma, lymph nodes of multiple sites: Secondary | ICD-10-CM | POA: Diagnosis not present

## 2019-02-14 DIAGNOSIS — Z5111 Encounter for antineoplastic chemotherapy: Secondary | ICD-10-CM | POA: Diagnosis not present

## 2019-02-14 DIAGNOSIS — Z5189 Encounter for other specified aftercare: Secondary | ICD-10-CM | POA: Diagnosis not present

## 2019-02-14 LAB — CMP (CANCER CENTER ONLY)
ALT: 24 U/L (ref 0–44)
AST: 21 U/L (ref 15–41)
Albumin: 3.9 g/dL (ref 3.5–5.0)
Alkaline Phosphatase: 53 U/L (ref 38–126)
Anion gap: 11 (ref 5–15)
BUN: 13 mg/dL (ref 8–23)
CO2: 26 mmol/L (ref 22–32)
Calcium: 8.9 mg/dL (ref 8.9–10.3)
Chloride: 101 mmol/L (ref 98–111)
Creatinine: 1.01 mg/dL (ref 0.61–1.24)
GFR, Est AFR Am: >60 mL/min (ref >60)
GFR, Estimated: >60 mL/min (ref >60)
Glucose, Bld: 271 mg/dL — ABNORMAL HIGH (ref 70–99)
Potassium: 3.6 mmol/L (ref 3.5–5.1)
Sodium: 138 mmol/L (ref 135–145)
Total Bilirubin: 0.5 mg/dL (ref 0.3–1.2)
Total Protein: 6.1 g/dL — ABNORMAL LOW (ref 6.5–8.1)

## 2019-02-14 LAB — CBC WITH DIFFERENTIAL (CANCER CENTER ONLY)
Abs Immature Granulocytes: 0.03 10*3/uL (ref 0.00–0.07)
Basophils Absolute: 0 10*3/uL (ref 0.0–0.1)
Basophils Relative: 1 %
Eosinophils Absolute: 0.1 10*3/uL (ref 0.0–0.5)
Eosinophils Relative: 2 %
HCT: 33.1 % — ABNORMAL LOW (ref 39.0–52.0)
Hemoglobin: 11.5 g/dL — ABNORMAL LOW (ref 13.0–17.0)
Immature Granulocytes: 1 %
Lymphocytes Relative: 15 %
Lymphs Abs: 0.7 10*3/uL (ref 0.7–4.0)
MCH: 30.5 pg (ref 26.0–34.0)
MCHC: 34.7 g/dL (ref 30.0–36.0)
MCV: 87.8 fL (ref 80.0–100.0)
Monocytes Absolute: 0.4 10*3/uL (ref 0.1–1.0)
Monocytes Relative: 9 %
Neutro Abs: 3.4 10*3/uL (ref 1.7–7.7)
Neutrophils Relative %: 72 %
Platelet Count: 203 10*3/uL (ref 150–400)
RBC: 3.77 MIL/uL — ABNORMAL LOW (ref 4.22–5.81)
RDW: 15 % (ref 11.5–15.5)
WBC Count: 4.6 10*3/uL (ref 4.0–10.5)
nRBC: 0 % (ref 0.0–0.2)

## 2019-02-14 LAB — URIC ACID: Uric Acid, Serum: 6 mg/dL (ref 3.7–8.6)

## 2019-02-14 LAB — LACTATE DEHYDROGENASE: LDH: 191 U/L (ref 98–192)

## 2019-02-14 MED ORDER — HEPARIN SOD (PORK) LOCK FLUSH 100 UNIT/ML IV SOLN
500.0000 [IU] | Freq: Once | INTRAVENOUS | Status: AC | PRN
  Administered 2019-02-14: 500 [IU]
  Filled 2019-02-14: qty 5

## 2019-02-14 MED ORDER — SODIUM CHLORIDE 0.9% FLUSH
10.0000 mL | INTRAVENOUS | Status: DC | PRN
  Administered 2019-02-14: 10 mL
  Filled 2019-02-14: qty 10

## 2019-02-14 MED ORDER — SODIUM CHLORIDE 0.9 % IV SOLN
750.0000 mg/m2 | Freq: Once | INTRAVENOUS | Status: AC
  Administered 2019-02-14: 1960 mg via INTRAVENOUS
  Filled 2019-02-14: qty 98

## 2019-02-14 MED ORDER — ACETAMINOPHEN 325 MG PO TABS
ORAL_TABLET | ORAL | Status: AC
Start: 2019-02-14 — End: ?
  Filled 2019-02-14: qty 2

## 2019-02-14 MED ORDER — ACETAMINOPHEN 325 MG PO TABS
650.0000 mg | ORAL_TABLET | Freq: Once | ORAL | Status: AC
  Administered 2019-02-14: 650 mg via ORAL

## 2019-02-14 MED ORDER — SODIUM CHLORIDE 0.9 % IV SOLN
Freq: Once | INTRAVENOUS | Status: AC
  Administered 2019-02-14: 09:00:00 via INTRAVENOUS
  Filled 2019-02-14: qty 5

## 2019-02-14 MED ORDER — SODIUM CHLORIDE 0.9 % IV SOLN
Freq: Once | INTRAVENOUS | Status: AC
  Administered 2019-02-14: 09:00:00 via INTRAVENOUS
  Filled 2019-02-14: qty 250

## 2019-02-14 MED ORDER — DIPHENHYDRAMINE HCL 25 MG PO CAPS
50.0000 mg | ORAL_CAPSULE | Freq: Once | ORAL | Status: AC
  Administered 2019-02-14: 50 mg via ORAL

## 2019-02-14 MED ORDER — DOXORUBICIN HCL CHEMO IV INJECTION 2 MG/ML
50.0000 mg/m2 | Freq: Once | INTRAVENOUS | Status: AC
  Administered 2019-02-14: 130 mg via INTRAVENOUS
  Filled 2019-02-14: qty 65

## 2019-02-14 MED ORDER — DIPHENHYDRAMINE HCL 25 MG PO CAPS
ORAL_CAPSULE | ORAL | Status: AC
Start: 2019-02-14 — End: ?
  Filled 2019-02-14: qty 2

## 2019-02-14 MED ORDER — PALONOSETRON HCL INJECTION 0.25 MG/5ML
0.2500 mg | Freq: Once | INTRAVENOUS | Status: AC
  Administered 2019-02-14: 0.25 mg via INTRAVENOUS

## 2019-02-14 MED ORDER — PALONOSETRON HCL INJECTION 0.25 MG/5ML
INTRAVENOUS | Status: AC
  Filled 2019-02-14: qty 5

## 2019-02-14 MED ORDER — SODIUM CHLORIDE 0.9 % IV SOLN
375.0000 mg/m2 | Freq: Once | INTRAVENOUS | Status: AC
  Administered 2019-02-14: 1000 mg via INTRAVENOUS
  Filled 2019-02-14: qty 100

## 2019-02-14 NOTE — Progress Notes (Signed)
Hematology and Oncology Follow Up Visit  Ronald Rice [id number] 1945/09/26 73 y.o. 02/14/2019   Principle Diagnosis:   Diffuse large cell non-Hodgkin's lymphoma-Richter's transformation from CLL  Current Therapy:    R-CHOP-s/p cycle #4 -- started on 11/08/2018     Interim History:  Ronald Rice is back for for follow-up.   Overall, he is doing well with his bed to the chemotherapy.  He is still dealing with the broken left ankle.  He has a cast on.  He is not sure when the cast will come off.  He cannot bear any weight on this.  Otherwise, he is really had no issues.  He has had no problems with nausea or vomiting.  His real problem has been with neuropathy.  I think the diabetes is certainly making this worse.  We will need to make sure that we drop his vincristine now.  I just do not want to see him have issues with permanent neuropathic issues with his hands.  He has not noted any swollen lymph nodes.  The adenopathy in the left neck really has responded quite nicely.  He has had no nausea or vomiting.  He has had no diarrhea.  He has had no rashes.  There has been no cough or shortness of breath.  Overall, his performance status is ECOG 1.   Medications:  Current Outpatient Medications:  .  acetaminophen (TYLENOL) 500 MG tablet, Take 1,000 mg by mouth every 6 (six) hours as needed (pain). , Disp: , Rfl:  .  allopurinol (ZYLOPRIM) 300 MG tablet, Take 300 mg by mouth daily., Disp: , Rfl:  .  aspirin EC 81 MG tablet, Take 81 mg by mouth daily., Disp: , Rfl:  .  atenolol (TENORMIN) 25 MG tablet, Take 25 mg by mouth daily. , Disp: , Rfl:  .  atorvastatin (LIPITOR) 40 MG tablet, Take 40 mg by mouth daily., Disp: , Rfl:  .  CINNAMON PO, Take 1 tablet by mouth daily., Disp: , Rfl:  .  diclofenac sodium (VOLTAREN) 1 % GEL, Apply 1 application topically as needed (pain). , Disp: , Rfl:  .  diphenhydrAMINE (SIMPLY SLEEP) 25 MG tablet, Take 25 mg by mouth at bedtime as needed for sleep., Disp:  , Rfl:  .  famotidine (PEPCID) 20 MG tablet, Take 20 mg by mouth at bedtime as needed for heartburn. , Disp: , Rfl:  .  hydrochlorothiazide (HYDRODIURIL) 25 MG tablet, Take 25 mg by mouth daily. , Disp: , Rfl:  .  lidocaine-prilocaine (EMLA) cream, Apply to PAC 1 hour prior to procedure., Disp: 30 g, Rfl: 3 .  lisinopril (ZESTRIL) 40 MG tablet, Take 1 tablet (40 mg total) by mouth daily for 30 days., Disp: 30 tablet, Rfl: 0 .  loratadine (CLARITIN) 10 MG tablet, Take 10 mg by mouth daily., Disp: , Rfl:  .  metFORMIN (GLUCOPHAGE-XR) 500 MG 24 hr tablet, Take 500 mg by mouth every evening. , Disp: , Rfl:  .  NON FORMULARY, Take 2 tablets by mouth daily. Blood Sugar Defense , Disp: , Rfl:  .  ondansetron (ZOFRAN) 8 MG tablet, Take 1 tablet (8 mg total) by mouth 2 (two) times daily as needed for refractory nausea / vomiting. Start on day 3 after cyclophosphamide chemotherapy., Disp: 30 tablet, Rfl: 1 .  potassium chloride (K-DUR) 10 MEQ tablet, Take 10 mEq by mouth daily. , Disp: , Rfl:  .  predniSONE (DELTASONE) 20 MG tablet, Take 3 tablets (60 mg total) by mouth daily.  Take on days 1-5 of chemotherapy., Disp: 15 tablet, Rfl: 6 .  tamsulosin (FLOMAX) 0.4 MG CAPS capsule, Take 0.4 mg by mouth daily., Disp: , Rfl:  .  prochlorperazine (COMPAZINE) 10 MG tablet, Take 1 tablet (10 mg total) by mouth every 6 (six) hours as needed for up to 5 days (Nausea or vomiting)., Disp: 20 tablet, Rfl: 0  Allergies:  No Known Allergies  Past Medical History, Surgical history, Social history, and Family History were reviewed and updated.  Review of Systems: Review of Systems  Constitutional: Negative.   HENT:  Negative.   Eyes: Negative.   Respiratory: Negative.   Cardiovascular: Negative.   Gastrointestinal: Negative.   Endocrine: Negative.   Genitourinary: Positive for nocturia.   Musculoskeletal: Positive for arthralgias, gait problem and myalgias.  Neurological: Positive for gait problem.   Hematological: Negative.   Psychiatric/Behavioral: Negative.     Physical Exam:  temporal temperature is 97.1 F (36.2 C) (abnormal). His blood pressure is 131/84 and his pulse is 91. His respiration is 18 and oxygen saturation is 97%.   Wt Readings from Last 3 Encounters:  12/25/18 279 lb 12.2 oz (126.9 kg)  12/19/18 279 lb 15.8 oz (127 kg)  12/13/18 280 lb (127 kg)    Physical Exam Vitals signs reviewed.  HENT:     Head: Normocephalic and atraumatic.  Eyes:     Pupils: Pupils are equal, round, and reactive to light.  Neck:     Comments: Examination of his neck shows marked improvement in the adenopathy in the left neck.  He still has some palpable posterior cervical lymphadenopathy.  It probably measures about 2 x 2 cm.  It is not as firm.  It is somewhat mobile.. Cardiovascular:     Rate and Rhythm: Normal rate and regular rhythm.     Heart sounds: Normal heart sounds.  Pulmonary:     Effort: Pulmonary effort is normal.     Breath sounds: Normal breath sounds.  Abdominal:     General: Bowel sounds are normal.     Palpations: Abdomen is soft.  Musculoskeletal: Normal range of motion.        General: No tenderness or deformity.     Comments: He has a cast on the right foot and lower leg.  This goes about two thirds the way up his right leg.  Left leg is unremarkable.  He has no swelling.  He has decent pulses.  He has good range of motion of his joints.  Lymphadenopathy:     Cervical: No cervical adenopathy.  Skin:    General: Skin is warm and dry.     Findings: No erythema or rash.  Neurological:     Mental Status: He is alert and oriented to person, place, and time.  Psychiatric:        Behavior: Behavior normal.        Thought Content: Thought content normal.        Judgment: Judgment normal.      Lab Results  Component Value Date   WBC 4.6 02/14/2019   HGB 11.5 (L) 02/14/2019   HCT 33.1 (L) 02/14/2019   MCV 87.8 02/14/2019   PLT 203 02/14/2019      Chemistry      Component Value Date/Time   NA 141 01/24/2019 0840   NA 142 01/25/2017 1217   K 3.5 01/24/2019 0840   K 4.1 01/25/2017 1217   CL 105 01/24/2019 0840   CL 102 01/25/2017 1217  CO2 25 01/24/2019 0840   CO2 33 01/25/2017 1217   BUN 12 01/24/2019 0840   BUN 12 01/25/2017 1217   CREATININE 1.09 01/24/2019 0840   CREATININE 1.4 (H) 01/25/2017 1217      Component Value Date/Time   CALCIUM 8.9 01/24/2019 0840   CALCIUM 9.7 01/25/2017 1217   ALKPHOS 50 01/24/2019 0840   ALKPHOS 59 01/25/2017 1217   AST 18 01/24/2019 0840   ALT 24 01/24/2019 0840   ALT 32 01/25/2017 1217   BILITOT 0.4 01/24/2019 0840         Impression and Plan: Ronald Rice is a 73 year old African-American male.  He was diagnosed with CLL back in [place].  We will go ahead with his fifth cycle of chemotherapy today.    Because of the bulky lymphadenopathy in the left neck.  We may have to think about radiation therapy to this area once we get done with treatment.  We will plan to get him back in 3 weeks for his sixth cycle of treatment.  He will need Neulasta.  I probably will also need to get him on some oral antibiotics to help with neutropenia.   Volanda Napoleon, MD 9/23/20208:41 AM

## 2019-02-14 NOTE — Patient Instructions (Signed)
[facility] Discharge Instructions for Patients Receiving Chemotherapy  Today you received the following chemotherapy agents Adriamycin, Cytoxan and Rituxan- NO Vectibix given today. To help prevent nausea and vomiting after your treatment, we encourage you to take your nausea medication as directed BUT NO ZOFRAN FOR 3 DAYS AFTER CHEMO.   If you develop nausea and vomiting that is not controlled by your nausea medication, call the clinic.   BELOW ARE SYMPTOMS THAT SHOULD BE REPORTED IMMEDIATELY:  *FEVER GREATER THAN 100.5 F  *CHILLS WITH OR WITHOUT FEVER  NAUSEA AND VOMITING THAT IS NOT CONTROLLED WITH YOUR NAUSEA MEDICATION  *UNUSUAL SHORTNESS OF BREATH  *UNUSUAL BRUISING OR BLEEDING  TENDERNESS IN MOUTH AND THROAT WITH OR WITHOUT PRESENCE OF ULCERS  *URINARY PROBLEMS  *BOWEL PROBLEMS  UNUSUAL RASH Items with * indicate a potential emergency and should be followed up as soon as possible.  Feel free to call the clinic you have any questions or concerns. The clinic phone number is [phone].  Please show the [facility] at check-in to the Emergency Department and triage nurse.

## 2019-02-15 DIAGNOSIS — I10 Essential (primary) hypertension: Secondary | ICD-10-CM | POA: Diagnosis not present

## 2019-02-15 DIAGNOSIS — N400 Enlarged prostate without lower urinary tract symptoms: Secondary | ICD-10-CM | POA: Diagnosis not present

## 2019-02-15 DIAGNOSIS — K219 Gastro-esophageal reflux disease without esophagitis: Secondary | ICD-10-CM | POA: Diagnosis not present

## 2019-02-15 DIAGNOSIS — C9110 Chronic lymphocytic leukemia of B-cell type not having achieved remission: Secondary | ICD-10-CM | POA: Diagnosis not present

## 2019-02-15 DIAGNOSIS — S82891D Other fracture of right lower leg, subsequent encounter for closed fracture with routine healing: Secondary | ICD-10-CM | POA: Diagnosis not present

## 2019-02-15 DIAGNOSIS — E119 Type 2 diabetes mellitus without complications: Secondary | ICD-10-CM | POA: Diagnosis not present

## 2019-02-15 DIAGNOSIS — I2510 Atherosclerotic heart disease of native coronary artery without angina pectoris: Secondary | ICD-10-CM | POA: Diagnosis not present

## 2019-02-15 DIAGNOSIS — M1990 Unspecified osteoarthritis, unspecified site: Secondary | ICD-10-CM | POA: Diagnosis not present

## 2019-02-15 DIAGNOSIS — G4733 Obstructive sleep apnea (adult) (pediatric): Secondary | ICD-10-CM | POA: Diagnosis not present

## 2019-02-16 ENCOUNTER — Ambulatory Visit: Payer: Medicare HMO | Admitting: Hematology & Oncology

## 2019-02-16 ENCOUNTER — Other Ambulatory Visit: Payer: Self-pay

## 2019-02-16 ENCOUNTER — Inpatient Hospital Stay: Payer: Medicare HMO

## 2019-02-16 VITALS — BP 93/57 | HR 88 | Temp 97.7°F | Resp 20

## 2019-02-16 DIAGNOSIS — Z5111 Encounter for antineoplastic chemotherapy: Secondary | ICD-10-CM | POA: Diagnosis not present

## 2019-02-16 DIAGNOSIS — C8338 Diffuse large B-cell lymphoma, lymph nodes of multiple sites: Secondary | ICD-10-CM

## 2019-02-16 DIAGNOSIS — Z5112 Encounter for antineoplastic immunotherapy: Secondary | ICD-10-CM | POA: Diagnosis not present

## 2019-02-16 DIAGNOSIS — Z5189 Encounter for other specified aftercare: Secondary | ICD-10-CM | POA: Diagnosis not present

## 2019-02-16 DIAGNOSIS — C9110 Chronic lymphocytic leukemia of B-cell type not having achieved remission: Secondary | ICD-10-CM | POA: Diagnosis not present

## 2019-02-16 MED ORDER — PEGFILGRASTIM-CBQV 6 MG/0.6ML ~~LOC~~ SOSY
6.0000 mg | PREFILLED_SYRINGE | Freq: Once | SUBCUTANEOUS | Status: AC
  Administered 2019-02-16: 6 mg via SUBCUTANEOUS

## 2019-02-16 MED ORDER — PEGFILGRASTIM-CBQV 6 MG/0.6ML ~~LOC~~ SOSY
PREFILLED_SYRINGE | SUBCUTANEOUS | Status: AC
  Filled 2019-02-16: qty 0.6

## 2019-02-16 NOTE — Patient Instructions (Signed)

## 2019-02-20 ENCOUNTER — Encounter (HOSPITAL_COMMUNITY): Payer: Self-pay | Admitting: Emergency Medicine

## 2019-02-20 ENCOUNTER — Emergency Department (HOSPITAL_COMMUNITY)
Admission: EM | Admit: 2019-02-20 | Discharge: 2019-02-20 | Discharge status: Against Medical Advice | Payer: Medicare HMO | Attending: Emergency Medicine | Admitting: Emergency Medicine

## 2019-02-20 ENCOUNTER — Other Ambulatory Visit: Payer: Self-pay

## 2019-02-20 ENCOUNTER — Telehealth: Payer: Self-pay | Admitting: *Deleted

## 2019-02-20 DIAGNOSIS — R5383 Other fatigue: Secondary | ICD-10-CM | POA: Diagnosis present

## 2019-02-20 DIAGNOSIS — Z5321 Procedure and treatment not carried out due to patient leaving prior to being seen by health care provider: Secondary | ICD-10-CM | POA: Insufficient documentation

## 2019-02-20 LAB — BASIC METABOLIC PANEL
Anion gap: 9 (ref 5–15)
BUN: 16 mg/dL (ref 8–23)
CO2: 27 mmol/L (ref 22–32)
Calcium: 9.4 mg/dL (ref 8.9–10.3)
Chloride: 103 mmol/L (ref 98–111)
Creatinine, Ser: 1.03 mg/dL (ref 0.61–1.24)
GFR calc Af Amer: >60 mL/min (ref >60)
GFR calc non Af Amer: >60 mL/min (ref >60)
Glucose, Bld: 125 mg/dL — ABNORMAL HIGH (ref 70–99)
Potassium: 4.6 mmol/L (ref 3.5–5.1)
Sodium: 139 mmol/L (ref 135–145)

## 2019-02-20 LAB — CBC
HCT: 34.6 % — ABNORMAL LOW (ref 39.0–52.0)
Hemoglobin: 12 g/dL — ABNORMAL LOW (ref 13.0–17.0)
MCH: 31.1 pg (ref 26.0–34.0)
MCHC: 34.7 g/dL (ref 30.0–36.0)
MCV: 89.6 fL (ref 80.0–100.0)
Platelets: 161 10*3/uL (ref 150–400)
RBC: 3.86 MIL/uL — ABNORMAL LOW (ref 4.22–5.81)
RDW: 15.3 % (ref 11.5–15.5)
WBC: 5.5 10*3/uL (ref 4.0–10.5)
nRBC: 0 % (ref 0.0–0.2)

## 2019-02-20 MED ORDER — SODIUM CHLORIDE 0.9% FLUSH: 3.0000 mL | Freq: Once | INTRAVENOUS | Status: DC

## 2019-02-20 NOTE — ED Notes (Signed)
Pt reports wanting to leave, pt encouraged to stay but he said his ride is already here.

## 2019-02-20 NOTE — ED Triage Notes (Signed)
Pt states starting today he has just been feeling "not well" pt states he has been sweating a lot and has no energy. Pt denies any pain or sob.

## 2019-02-20 NOTE — Telephone Encounter (Signed)
Call received from patient's wife stating that patient is having increased weakness, chills and diaphoresis.  Pt.'s wife does not know what pt.'s temperature is or his blood sugar is d/t thermometer and glucometer are not working at this time.  Dr. Marin Olp notified and order received for pt to go to the ED now.  Call placed back to patient and patient's wife notified to take pt to the [facility] ED now per order of Dr. Marin Olp.  Pt.'s wife states that she will take pt to the [facility] ED now.

## 2019-02-21 ENCOUNTER — Telehealth: Payer: Self-pay | Admitting: *Deleted

## 2019-02-21 DIAGNOSIS — S8262XH Displaced fracture of lateral malleolus of left fibula, subsequent encounter for open fracture type I or II with delayed healing: Secondary | ICD-10-CM | POA: Diagnosis not present

## 2019-02-21 NOTE — Telephone Encounter (Signed)
Call placed to check on patient's status today per order of Dr. Marin Olp.  Per pt.'s wife, pt is feeling "much better" today and is having no difficulties.  Pt.'s wife appreciative of call and has no questions or concerns at this time.

## 2019-02-22 ENCOUNTER — Telehealth: Payer: Self-pay | Admitting: *Deleted

## 2019-02-22 DIAGNOSIS — E119 Type 2 diabetes mellitus without complications: Secondary | ICD-10-CM | POA: Diagnosis not present

## 2019-02-22 DIAGNOSIS — M1990 Unspecified osteoarthritis, unspecified site: Secondary | ICD-10-CM | POA: Diagnosis not present

## 2019-02-22 DIAGNOSIS — K219 Gastro-esophageal reflux disease without esophagitis: Secondary | ICD-10-CM | POA: Diagnosis not present

## 2019-02-22 DIAGNOSIS — C9110 Chronic lymphocytic leukemia of B-cell type not having achieved remission: Secondary | ICD-10-CM | POA: Diagnosis not present

## 2019-02-22 DIAGNOSIS — S82891D Other fracture of right lower leg, subsequent encounter for closed fracture with routine healing: Secondary | ICD-10-CM | POA: Diagnosis not present

## 2019-02-22 DIAGNOSIS — N400 Enlarged prostate without lower urinary tract symptoms: Secondary | ICD-10-CM | POA: Diagnosis not present

## 2019-02-22 DIAGNOSIS — G4733 Obstructive sleep apnea (adult) (pediatric): Secondary | ICD-10-CM | POA: Diagnosis not present

## 2019-02-22 DIAGNOSIS — I10 Essential (primary) hypertension: Secondary | ICD-10-CM | POA: Diagnosis not present

## 2019-02-22 DIAGNOSIS — I2510 Atherosclerotic heart disease of native coronary artery without angina pectoris: Secondary | ICD-10-CM | POA: Diagnosis not present

## 2019-02-22 NOTE — Telephone Encounter (Signed)
Received a call from patient home nurse Patty from [facility] stating that patients pulse was 113-120 during their visit.  BP 126/70.  Pulse was regular.  Patient feels fine.  No other symptoms noted.  Dr. Marin Olp notified.  No orders received.  Patti notified that patient is to call primary care if any further issues with pulse.

## 2019-02-27 ENCOUNTER — Telehealth: Payer: Self-pay | Admitting: *Deleted

## 2019-02-27 ENCOUNTER — Other Ambulatory Visit: Payer: Self-pay | Admitting: Internal Medicine

## 2019-02-27 DIAGNOSIS — S82891D Other fracture of right lower leg, subsequent encounter for closed fracture with routine healing: Secondary | ICD-10-CM | POA: Diagnosis not present

## 2019-02-27 DIAGNOSIS — E041 Nontoxic single thyroid nodule: Secondary | ICD-10-CM

## 2019-02-27 DIAGNOSIS — C9110 Chronic lymphocytic leukemia of B-cell type not having achieved remission: Secondary | ICD-10-CM | POA: Diagnosis not present

## 2019-02-27 DIAGNOSIS — M1990 Unspecified osteoarthritis, unspecified site: Secondary | ICD-10-CM | POA: Diagnosis not present

## 2019-02-27 DIAGNOSIS — I2510 Atherosclerotic heart disease of native coronary artery without angina pectoris: Secondary | ICD-10-CM | POA: Diagnosis not present

## 2019-02-27 DIAGNOSIS — N400 Enlarged prostate without lower urinary tract symptoms: Secondary | ICD-10-CM | POA: Diagnosis not present

## 2019-02-27 DIAGNOSIS — K219 Gastro-esophageal reflux disease without esophagitis: Secondary | ICD-10-CM | POA: Diagnosis not present

## 2019-02-27 DIAGNOSIS — G4733 Obstructive sleep apnea (adult) (pediatric): Secondary | ICD-10-CM | POA: Diagnosis not present

## 2019-02-27 DIAGNOSIS — I10 Essential (primary) hypertension: Secondary | ICD-10-CM | POA: Diagnosis not present

## 2019-02-27 DIAGNOSIS — E119 Type 2 diabetes mellitus without complications: Secondary | ICD-10-CM | POA: Diagnosis not present

## 2019-02-27 NOTE — Telephone Encounter (Signed)
Call received from Patty with [facility] to notify Dr. Marin Olp that patient's HR is elevated to 109 at rest and 120 with movement.  Patty notified per order of Dr. Marin Olp to contact pt.'s PCP regarding elevated HR.

## 2019-02-28 ENCOUNTER — Telehealth: Payer: Self-pay | Admitting: *Deleted

## 2019-02-28 NOTE — Telephone Encounter (Signed)
Received phone call from patients wife Ronald Rice stating patient is still having numbness and tingling in his hands and difficulty gripping things. Dr. Marin Olp states that getting his blood sugars under control would help a great deal but he can take Vitamin B6 500 mg daily.  LMAM to call back.

## 2019-03-02 DIAGNOSIS — Z23 Encounter for immunization: Secondary | ICD-10-CM | POA: Diagnosis not present

## 2019-03-05 ENCOUNTER — Ambulatory Visit
Admission: RE | Admit: 2019-03-05 | Discharge: 2019-03-05 | Disposition: A | Discharge status: Home / Self Care | Payer: Medicare HMO | Source: Ambulatory Visit | Attending: Internal Medicine | Admitting: Internal Medicine

## 2019-03-05 DIAGNOSIS — E041 Nontoxic single thyroid nodule: Secondary | ICD-10-CM | POA: Diagnosis not present

## 2019-03-06 DIAGNOSIS — M7989 Other specified soft tissue disorders: Secondary | ICD-10-CM | POA: Diagnosis not present

## 2019-03-06 DIAGNOSIS — S82891D Other fracture of right lower leg, subsequent encounter for closed fracture with routine healing: Secondary | ICD-10-CM | POA: Diagnosis not present

## 2019-03-06 DIAGNOSIS — I2510 Atherosclerotic heart disease of native coronary artery without angina pectoris: Secondary | ICD-10-CM | POA: Diagnosis not present

## 2019-03-06 DIAGNOSIS — G4733 Obstructive sleep apnea (adult) (pediatric): Secondary | ICD-10-CM | POA: Diagnosis not present

## 2019-03-06 DIAGNOSIS — C9110 Chronic lymphocytic leukemia of B-cell type not having achieved remission: Secondary | ICD-10-CM | POA: Diagnosis not present

## 2019-03-06 DIAGNOSIS — M1990 Unspecified osteoarthritis, unspecified site: Secondary | ICD-10-CM | POA: Diagnosis not present

## 2019-03-06 DIAGNOSIS — I10 Essential (primary) hypertension: Secondary | ICD-10-CM | POA: Diagnosis not present

## 2019-03-06 DIAGNOSIS — K219 Gastro-esophageal reflux disease without esophagitis: Secondary | ICD-10-CM | POA: Diagnosis not present

## 2019-03-06 DIAGNOSIS — N400 Enlarged prostate without lower urinary tract symptoms: Secondary | ICD-10-CM | POA: Diagnosis not present

## 2019-03-06 DIAGNOSIS — R000 Tachycardia, unspecified: Secondary | ICD-10-CM | POA: Diagnosis not present

## 2019-03-06 DIAGNOSIS — C8598 Non-Hodgkin lymphoma, unspecified, lymph nodes of multiple sites: Secondary | ICD-10-CM | POA: Diagnosis not present

## 2019-03-06 DIAGNOSIS — E119 Type 2 diabetes mellitus without complications: Secondary | ICD-10-CM | POA: Diagnosis not present

## 2019-03-07 ENCOUNTER — Encounter: Payer: Self-pay | Admitting: Hematology & Oncology

## 2019-03-07 ENCOUNTER — Telehealth: Payer: Self-pay | Admitting: *Deleted

## 2019-03-07 ENCOUNTER — Inpatient Hospital Stay (HOSPITAL_BASED_OUTPATIENT_CLINIC_OR_DEPARTMENT_OTHER): Payer: Medicare HMO | Admitting: Hematology & Oncology

## 2019-03-07 ENCOUNTER — Inpatient Hospital Stay: Payer: Medicare HMO

## 2019-03-07 ENCOUNTER — Other Ambulatory Visit: Payer: Self-pay

## 2019-03-07 ENCOUNTER — Inpatient Hospital Stay: Payer: Medicare HMO | Attending: Hematology & Oncology

## 2019-03-07 ENCOUNTER — Other Ambulatory Visit: Payer: Self-pay | Admitting: *Deleted

## 2019-03-07 VITALS — BP 140/88 | HR 87 | Temp 96.6°F | Resp 18 | Ht 74.0 in | Wt 281.6 lb

## 2019-03-07 VITALS — BP 125/75 | HR 84 | Temp 97.5°F | Resp 18

## 2019-03-07 DIAGNOSIS — C9110 Chronic lymphocytic leukemia of B-cell type not having achieved remission: Secondary | ICD-10-CM | POA: Insufficient documentation

## 2019-03-07 DIAGNOSIS — Z5111 Encounter for antineoplastic chemotherapy: Secondary | ICD-10-CM | POA: Diagnosis not present

## 2019-03-07 DIAGNOSIS — E119 Type 2 diabetes mellitus without complications: Secondary | ICD-10-CM | POA: Insufficient documentation

## 2019-03-07 DIAGNOSIS — C8338 Diffuse large B-cell lymphoma, lymph nodes of multiple sites: Secondary | ICD-10-CM

## 2019-03-07 DIAGNOSIS — Z5189 Encounter for other specified aftercare: Secondary | ICD-10-CM | POA: Diagnosis not present

## 2019-03-07 DIAGNOSIS — G4733 Obstructive sleep apnea (adult) (pediatric): Secondary | ICD-10-CM | POA: Diagnosis not present

## 2019-03-07 DIAGNOSIS — Z5112 Encounter for antineoplastic immunotherapy: Secondary | ICD-10-CM | POA: Diagnosis not present

## 2019-03-07 LAB — CBC WITH DIFFERENTIAL (CANCER CENTER ONLY)
Abs Immature Granulocytes: 0.03 10*3/uL (ref 0.00–0.07)
Basophils Absolute: 0 10*3/uL (ref 0.0–0.1)
Basophils Relative: 1 %
Eosinophils Absolute: 0.1 10*3/uL (ref 0.0–0.5)
Eosinophils Relative: 1 %
HCT: 34.9 % — ABNORMAL LOW (ref 39.0–52.0)
Hemoglobin: 12.2 g/dL — ABNORMAL LOW (ref 13.0–17.0)
Immature Granulocytes: 1 %
Lymphocytes Relative: 13 %
Lymphs Abs: 0.7 10*3/uL (ref 0.7–4.0)
MCH: 30.7 pg (ref 26.0–34.0)
MCHC: 35 g/dL (ref 30.0–36.0)
MCV: 87.7 fL (ref 80.0–100.0)
Monocytes Absolute: 0.3 10*3/uL (ref 0.1–1.0)
Monocytes Relative: 6 %
Neutro Abs: 4.1 10*3/uL (ref 1.7–7.7)
Neutrophils Relative %: 78 %
Platelet Count: 205 10*3/uL (ref 150–400)
RBC: 3.98 MIL/uL — ABNORMAL LOW (ref 4.22–5.81)
RDW: 14.6 % (ref 11.5–15.5)
WBC Count: 5.2 10*3/uL (ref 4.0–10.5)
nRBC: 0 % (ref 0.0–0.2)

## 2019-03-07 LAB — CMP (CANCER CENTER ONLY)
ALT: 31 U/L (ref 0–44)
AST: 23 U/L (ref 15–41)
Albumin: 4.2 g/dL (ref 3.5–5.0)
Alkaline Phosphatase: 49 U/L (ref 38–126)
Anion gap: 9 (ref 5–15)
BUN: 15 mg/dL (ref 8–23)
CO2: 28 mmol/L (ref 22–32)
Calcium: 9.3 mg/dL (ref 8.9–10.3)
Chloride: 104 mmol/L (ref 98–111)
Creatinine: 1.07 mg/dL (ref 0.61–1.24)
GFR, Est AFR Am: >60 mL/min (ref >60)
GFR, Estimated: >60 mL/min (ref >60)
Glucose, Bld: 291 mg/dL — ABNORMAL HIGH (ref 70–99)
Potassium: 3.7 mmol/L (ref 3.5–5.1)
Sodium: 141 mmol/L (ref 135–145)
Total Bilirubin: 0.4 mg/dL (ref 0.3–1.2)
Total Protein: 6.1 g/dL — ABNORMAL LOW (ref 6.5–8.1)

## 2019-03-07 LAB — LACTATE DEHYDROGENASE: LDH: 209 U/L — ABNORMAL HIGH (ref 98–192)

## 2019-03-07 MED ORDER — GABAPENTIN 300 MG PO CAPS: 300.0000 mg | ORAL_CAPSULE | Freq: Three times a day (TID) | ORAL | 3 refills | Status: DC

## 2019-03-07 MED ORDER — SODIUM CHLORIDE 0.9 % IV SOLN
Freq: Once | INTRAVENOUS | Status: AC
  Administered 2019-03-07: 10:00:00 via INTRAVENOUS
  Filled 2019-03-07: qty 5

## 2019-03-07 MED ORDER — HEPARIN SOD (PORK) LOCK FLUSH 100 UNIT/ML IV SOLN
500.0000 [IU] | Freq: Once | INTRAVENOUS | Status: AC | PRN
  Administered 2019-03-07: 13:00:00 500 [IU]
  Filled 2019-03-07: qty 5

## 2019-03-07 MED ORDER — PALONOSETRON HCL INJECTION 0.25 MG/5ML
0.2500 mg | Freq: Once | INTRAVENOUS | Status: AC
  Administered 2019-03-07: 09:00:00 0.25 mg via INTRAVENOUS

## 2019-03-07 MED ORDER — SODIUM CHLORIDE 0.9 % IV SOLN
375.0000 mg/m2 | Freq: Once | INTRAVENOUS | Status: AC
  Administered 2019-03-07: 12:00:00 1000 mg via INTRAVENOUS
  Filled 2019-03-07: qty 100

## 2019-03-07 MED ORDER — SODIUM CHLORIDE 0.9 % IV SOLN
Freq: Once | INTRAVENOUS | Status: AC
  Administered 2019-03-07: 10:00:00 via INTRAVENOUS
  Filled 2019-03-07: qty 250

## 2019-03-07 MED ORDER — DIPHENHYDRAMINE HCL 25 MG PO CAPS
50.0000 mg | ORAL_CAPSULE | Freq: Once | ORAL | Status: AC
  Administered 2019-03-07: 09:00:00 50 mg via ORAL

## 2019-03-07 MED ORDER — PALONOSETRON HCL INJECTION 0.25 MG/5ML
INTRAVENOUS | Status: AC
  Filled 2019-03-07: qty 5

## 2019-03-07 MED ORDER — DULOXETINE HCL 30 MG PO CPEP: 30.0000 mg | ORAL_CAPSULE | Freq: Every day | ORAL | 3 refills | Status: DC

## 2019-03-07 MED ORDER — SODIUM CHLORIDE 0.9% FLUSH
10.0000 mL | INTRAVENOUS | Status: DC | PRN
  Administered 2019-03-07: 13:00:00 10 mL
  Filled 2019-03-07: qty 10

## 2019-03-07 MED ORDER — DOXORUBICIN HCL CHEMO IV INJECTION 2 MG/ML
50.0000 mg/m2 | Freq: Once | INTRAVENOUS | Status: AC
  Administered 2019-03-07: 130 mg via INTRAVENOUS
  Filled 2019-03-07: qty 65

## 2019-03-07 MED ORDER — DIPHENHYDRAMINE HCL 25 MG PO CAPS
ORAL_CAPSULE | ORAL | Status: AC
  Filled 2019-03-07: qty 2

## 2019-03-07 MED ORDER — INSULIN REGULAR HUMAN 100 UNIT/ML IJ SOLN
15.0000 [IU] | Freq: Once | INTRAMUSCULAR | Status: AC
  Administered 2019-03-07: 10:00:00 15 [IU] via SUBCUTANEOUS

## 2019-03-07 MED ORDER — ACETAMINOPHEN 325 MG PO TABS
650.0000 mg | ORAL_TABLET | Freq: Once | ORAL | Status: AC
  Administered 2019-03-07: 09:00:00 650 mg via ORAL

## 2019-03-07 MED ORDER — SODIUM CHLORIDE 0.9 % IV SOLN
750.0000 mg/m2 | Freq: Once | INTRAVENOUS | Status: AC
  Administered 2019-03-07: 11:00:00 1960 mg via INTRAVENOUS
  Filled 2019-03-07: qty 98

## 2019-03-07 MED ORDER — ACETAMINOPHEN 325 MG PO TABS
ORAL_TABLET | ORAL | Status: AC
  Filled 2019-03-07: qty 2

## 2019-03-07 NOTE — Patient Instructions (Signed)
[facility] Discharge Instructions for Patients Receiving Chemotherapy  Today you received the following chemotherapy agents Adriamycin, Cytoxan and Rituxan-  To help prevent nausea and vomiting after your treatment, we encourage you to take your nausea medication as directed BUT NO ZOFRAN FOR 3 DAYS AFTER CHEMO.   If you develop nausea and vomiting that is not controlled by your nausea medication, call the clinic.   BELOW ARE SYMPTOMS THAT SHOULD BE REPORTED IMMEDIATELY:  *FEVER GREATER THAN 100.5 F  *CHILLS WITH OR WITHOUT FEVER  NAUSEA AND VOMITING THAT IS NOT CONTROLLED WITH YOUR NAUSEA MEDICATION  *UNUSUAL SHORTNESS OF BREATH  *UNUSUAL BRUISING OR BLEEDING  TENDERNESS IN MOUTH AND THROAT WITH OR WITHOUT PRESENCE OF ULCERS  *URINARY PROBLEMS  *BOWEL PROBLEMS  UNUSUAL RASH Items with * indicate a potential emergency and should be followed up as soon as possible.  Feel free to call the clinic you have any questions or concerns. The clinic phone number is [phone].  Please show the [facility] at check-in to the Emergency Department and triage nurse.

## 2019-03-07 NOTE — Telephone Encounter (Signed)
Call received from patient stating that he is not able to take the Neurontin three times a day and is requesting a medication that he can take once a day.  Dr. Marin Olp notified and order received for pt to d/c neurontin and to take Cymbalta 30 mg daily.  Call placed back to patient and message left to notify him of changes.

## 2019-03-07 NOTE — Patient Instructions (Signed)

## 2019-03-07 NOTE — Progress Notes (Signed)
Hematology and Oncology Follow Up Visit  Ronald Rice [id number] 02/21/1946 73 y.o. 03/07/2019   Principle Diagnosis:   Diffuse large cell non-Hodgkin's lymphoma-Richter's transformation from CLL  Current Therapy:    R-CHOP-s/p cycle #5 -- started on 11/08/2018     Interim History:  Ronald Rice is back for for follow-up.   He has been making it okay.  He is doing some I think physical therapy at home.  He has a home health nurse come out.  She always calls is because his heart rate is on the high side.  Today, his heart rate was only 87 bpm.  He did see his doctor yesterday.  He is having neuropathy in his hands.  I think this is more so from his diabetes.  His blood sugar today was 291.  I will start him on some gabapentin at 300 mg p.o. 3 times daily.  He has had no issues with respect to the healing of the fracture in his left ankle.  Much sure how long the cast will stay on his left leg.  He has had no problems with rashes.  His appetite is been good.  There is no change in bowel or bladder habits.  He does have some sweats.  We will check a testosterone level on him.  Overall, I would say his performance status is probably ECOG 1.    Medications:  Current Outpatient Medications:  .  acetaminophen (TYLENOL) 500 MG tablet, Take 1,000 mg by mouth every 6 (six) hours as needed (pain). , Disp: , Rfl:  .  allopurinol (ZYLOPRIM) 300 MG tablet, Take 300 mg by mouth daily., Disp: , Rfl:  .  aspirin EC 81 MG tablet, Take 81 mg by mouth daily., Disp: , Rfl:  .  atenolol (TENORMIN) 25 MG tablet, Take 25 mg by mouth daily. , Disp: , Rfl:  .  atorvastatin (LIPITOR) 40 MG tablet, Take 40 mg by mouth daily., Disp: , Rfl:  .  CINNAMON PO, Take 1 tablet by mouth daily., Disp: , Rfl:  .  diclofenac sodium (VOLTAREN) 1 % GEL, Apply 1 application topically as needed (pain). , Disp: , Rfl:  .  diphenhydrAMINE (SIMPLY SLEEP) 25 MG tablet, Take 25 mg by mouth at bedtime as needed for sleep., Disp:  , Rfl:  .  famotidine (PEPCID) 20 MG tablet, Take 20 mg by mouth at bedtime as needed for heartburn. , Disp: , Rfl:  .  hydrochlorothiazide (HYDRODIURIL) 25 MG tablet, Take 25 mg by mouth daily. , Disp: , Rfl:  .  lidocaine-prilocaine (EMLA) cream, Apply to PAC 1 hour prior to procedure., Disp: 30 g, Rfl: 3 .  lisinopril (ZESTRIL) 40 MG tablet, Take 1 tablet (40 mg total) by mouth daily for 30 days., Disp: 30 tablet, Rfl: 0 .  loratadine (CLARITIN) 10 MG tablet, Take 10 mg by mouth daily., Disp: , Rfl:  .  metFORMIN (GLUCOPHAGE-XR) 500 MG 24 hr tablet, Take 500 mg by mouth every evening. , Disp: , Rfl:  .  NON FORMULARY, Take 2 tablets by mouth daily. Blood Sugar Defense , Disp: , Rfl:  .  ondansetron (ZOFRAN) 8 MG tablet, Take 1 tablet (8 mg total) by mouth 2 (two) times daily as needed for refractory nausea / vomiting. Start on day 3 after cyclophosphamide chemotherapy., Disp: 30 tablet, Rfl: 1 .  potassium chloride (K-DUR) 10 MEQ tablet, Take 10 mEq by mouth daily. , Disp: , Rfl:  .  predniSONE (DELTASONE) 20 MG tablet, Take 3  tablets (60 mg total) by mouth daily. Take on days 1-5 of chemotherapy., Disp: 15 tablet, Rfl: 6 .  tamsulosin (FLOMAX) 0.4 MG CAPS capsule, Take 0.4 mg by mouth daily., Disp: , Rfl:  .  prochlorperazine (COMPAZINE) 10 MG tablet, Take 1 tablet (10 mg total) by mouth every 6 (six) hours as needed for up to 5 days (Nausea or vomiting)., Disp: 20 tablet, Rfl: 0  Allergies:  No Known Allergies  Past Medical History, Surgical history, Social history, and Family History were reviewed and updated.  Review of Systems: Review of Systems  Constitutional: Negative.   HENT:  Negative.   Eyes: Negative.   Respiratory: Negative.   Cardiovascular: Negative.   Gastrointestinal: Negative.   Endocrine: Negative.   Genitourinary: Positive for nocturia.   Musculoskeletal: Positive for arthralgias, gait problem and myalgias.  Neurological: Positive for gait problem.   Hematological: Negative.   Psychiatric/Behavioral: Negative.     Physical Exam:  height is 6\' 2"  (1.88 m) and weight is 281 lb 9.6 oz (127.7 kg). His temporal temperature is 96.6 F (35.9 C) (abnormal). His blood pressure is 140/88 and his pulse is 87. His respiration is 18 and oxygen saturation is 100%.   Wt Readings from Last 3 Encounters:  03/07/19 281 lb 9.6 oz (127.7 kg)  12/25/18 279 lb 12.2 oz (126.9 kg)  12/19/18 279 lb 15.8 oz (127 kg)    Physical Exam Vitals signs reviewed.  HENT:     Head: Normocephalic and atraumatic.  Eyes:     Pupils: Pupils are equal, round, and reactive to light.  Neck:     Comments: Examination of his neck shows marked improvement in the adenopathy in the left neck.  He still has some palpable posterior cervical lymphadenopathy.  It probably measures about 2 x 2 cm.  It is not as firm.  It is somewhat mobile.. Cardiovascular:     Rate and Rhythm: Normal rate and regular rhythm.     Heart sounds: Normal heart sounds.  Pulmonary:     Effort: Pulmonary effort is normal.     Breath sounds: Normal breath sounds.  Abdominal:     General: Bowel sounds are normal.     Palpations: Abdomen is soft.  Musculoskeletal: Normal range of motion.        General: No tenderness or deformity.     Comments: He has a cast on the right foot and lower leg.  This goes about two thirds the way up his right leg.  Left leg is unremarkable.  He has no swelling.  He has decent pulses.  He has good range of motion of his joints.  Lymphadenopathy:     Cervical: No cervical adenopathy.  Skin:    General: Skin is warm and dry.     Findings: No erythema or rash.  Neurological:     Mental Status: He is alert and oriented to person, place, and time.  Psychiatric:        Behavior: Behavior normal.        Thought Content: Thought content normal.        Judgment: Judgment normal.      Lab Results  Component Value Date   WBC 5.2 03/07/2019   HGB 12.2 (L) 03/07/2019    HCT 34.9 (L) 03/07/2019   MCV 87.7 03/07/2019   PLT 205 03/07/2019     Chemistry      Component Value Date/Time   NA 139 02/20/2019 1517   NA 142 01/25/2017 1217  K 4.6 02/20/2019 1517   K 4.1 01/25/2017 1217   CL 103 02/20/2019 1517   CL 102 01/25/2017 1217   CO2 27 02/20/2019 1517   CO2 33 01/25/2017 1217   BUN 16 02/20/2019 1517   BUN 12 01/25/2017 1217   CREATININE 1.03 02/20/2019 1517   CREATININE 1.01 02/14/2019 0822   CREATININE 1.4 (H) 01/25/2017 1217      Component Value Date/Time   CALCIUM 9.4 02/20/2019 1517   CALCIUM 9.7 01/25/2017 1217   ALKPHOS 53 02/14/2019 0822   ALKPHOS 59 01/25/2017 1217   AST 21 02/14/2019 0822   ALT 24 02/14/2019 0822   ALT 32 01/25/2017 1217   BILITOT 0.5 02/14/2019 [id number]         Impression and Plan: Ronald Rice is a 73 year old African-American male.  He was diagnosed with CLL back in [place].  We will go ahead with his sixth cycle of chemotherapy today.    Because of the bulky lymphadenopathy in the left neck.  We may have to think about radiation therapy to this area once we get done with treatment.  We will plan to get him back in 3 weeks for his seventh cycle of treatment.  I will give a total of 8 cycles of treatment.  I will give him some insulin today.  I will give him 15 units of regular insulin.  He will need Neulasta.  I probably will also need to get him on some oral antibiotics to help with neutropenia.   Volanda Napoleon, MD 10/14/20209:02 AM

## 2019-03-08 LAB — TESTOSTERONE: Testosterone: 314 ng/dL (ref 264–916)

## 2019-03-09 ENCOUNTER — Other Ambulatory Visit: Payer: Self-pay

## 2019-03-09 ENCOUNTER — Inpatient Hospital Stay: Payer: Medicare HMO

## 2019-03-09 VITALS — BP 118/86 | HR 61 | Temp 98.1°F | Resp 19

## 2019-03-09 DIAGNOSIS — C9110 Chronic lymphocytic leukemia of B-cell type not having achieved remission: Secondary | ICD-10-CM | POA: Diagnosis not present

## 2019-03-09 DIAGNOSIS — E119 Type 2 diabetes mellitus without complications: Secondary | ICD-10-CM | POA: Diagnosis not present

## 2019-03-09 DIAGNOSIS — Z5111 Encounter for antineoplastic chemotherapy: Secondary | ICD-10-CM | POA: Diagnosis not present

## 2019-03-09 DIAGNOSIS — C8338 Diffuse large B-cell lymphoma, lymph nodes of multiple sites: Secondary | ICD-10-CM

## 2019-03-09 DIAGNOSIS — Z5112 Encounter for antineoplastic immunotherapy: Secondary | ICD-10-CM | POA: Diagnosis not present

## 2019-03-09 DIAGNOSIS — Z5189 Encounter for other specified aftercare: Secondary | ICD-10-CM | POA: Diagnosis not present

## 2019-03-09 DIAGNOSIS — S82091D Other fracture of right patella, subsequent encounter for closed fracture with routine healing: Secondary | ICD-10-CM | POA: Diagnosis not present

## 2019-03-09 MED ORDER — PEGFILGRASTIM-CBQV 6 MG/0.6ML ~~LOC~~ SOSY
6.0000 mg | PREFILLED_SYRINGE | Freq: Once | SUBCUTANEOUS | Status: AC
  Administered 2019-03-09: 6 mg via SUBCUTANEOUS

## 2019-03-09 NOTE — Patient Instructions (Signed)

## 2019-03-15 DIAGNOSIS — S8262XA Displaced fracture of lateral malleolus of left fibula, initial encounter for closed fracture: Secondary | ICD-10-CM | POA: Diagnosis not present

## 2019-03-21 DIAGNOSIS — S8262XH Displaced fracture of lateral malleolus of left fibula, subsequent encounter for open fracture type I or II with delayed healing: Secondary | ICD-10-CM | POA: Diagnosis not present

## 2019-03-26 DIAGNOSIS — R413 Other amnesia: Secondary | ICD-10-CM | POA: Diagnosis not present

## 2019-03-28 ENCOUNTER — Inpatient Hospital Stay: Payer: Medicare HMO

## 2019-03-28 ENCOUNTER — Other Ambulatory Visit: Payer: Self-pay

## 2019-03-28 ENCOUNTER — Inpatient Hospital Stay: Payer: Medicare HMO | Attending: Hematology & Oncology | Admitting: Hematology & Oncology

## 2019-03-28 ENCOUNTER — Encounter: Payer: Self-pay | Admitting: Hematology & Oncology

## 2019-03-28 VITALS — BP 129/86 | HR 80 | Temp 97.5°F

## 2019-03-28 VITALS — BP 141/89 | HR 97 | Temp 97.3°F | Resp 18 | Wt 290.0 lb

## 2019-03-28 DIAGNOSIS — Z5189 Encounter for other specified aftercare: Secondary | ICD-10-CM | POA: Diagnosis not present

## 2019-03-28 DIAGNOSIS — Z5111 Encounter for antineoplastic chemotherapy: Secondary | ICD-10-CM | POA: Insufficient documentation

## 2019-03-28 DIAGNOSIS — Z5112 Encounter for antineoplastic immunotherapy: Secondary | ICD-10-CM | POA: Insufficient documentation

## 2019-03-28 DIAGNOSIS — C8338 Diffuse large B-cell lymphoma, lymph nodes of multiple sites: Secondary | ICD-10-CM | POA: Diagnosis not present

## 2019-03-28 DIAGNOSIS — C9110 Chronic lymphocytic leukemia of B-cell type not having achieved remission: Secondary | ICD-10-CM | POA: Insufficient documentation

## 2019-03-28 LAB — CBC WITH DIFFERENTIAL (CANCER CENTER ONLY)
Abs Immature Granulocytes: 0.04 10*3/uL (ref 0.00–0.07)
Basophils Absolute: 0 10*3/uL (ref 0.0–0.1)
Basophils Relative: 1 %
Eosinophils Absolute: 0.1 10*3/uL (ref 0.0–0.5)
Eosinophils Relative: 2 %
HCT: 34.1 % — ABNORMAL LOW (ref 39.0–52.0)
Hemoglobin: 11.9 g/dL — ABNORMAL LOW (ref 13.0–17.0)
Immature Granulocytes: 1 %
Lymphocytes Relative: 20 %
Lymphs Abs: 0.8 10*3/uL (ref 0.7–4.0)
MCH: 30.7 pg (ref 26.0–34.0)
MCHC: 34.9 g/dL (ref 30.0–36.0)
MCV: 87.9 fL (ref 80.0–100.0)
Monocytes Absolute: 0.4 10*3/uL (ref 0.1–1.0)
Monocytes Relative: 11 %
Neutro Abs: 2.5 10*3/uL (ref 1.7–7.7)
Neutrophils Relative %: 65 %
Platelet Count: 185 10*3/uL (ref 150–400)
RBC: 3.88 MIL/uL — ABNORMAL LOW (ref 4.22–5.81)
RDW: 14.6 % (ref 11.5–15.5)
WBC Count: 3.9 10*3/uL — ABNORMAL LOW (ref 4.0–10.5)
nRBC: 0 % (ref 0.0–0.2)

## 2019-03-28 LAB — CMP (CANCER CENTER ONLY)
ALT: 32 U/L (ref 0–44)
AST: 26 U/L (ref 15–41)
Albumin: 4.2 g/dL (ref 3.5–5.0)
Alkaline Phosphatase: 54 U/L (ref 38–126)
Anion gap: 9 (ref 5–15)
BUN: 10 mg/dL (ref 8–23)
CO2: 28 mmol/L (ref 22–32)
Calcium: 8.9 mg/dL (ref 8.9–10.3)
Chloride: 103 mmol/L (ref 98–111)
Creatinine: 1.07 mg/dL (ref 0.61–1.24)
GFR, Est AFR Am: >60 mL/min (ref >60)
GFR, Estimated: >60 mL/min (ref >60)
Glucose, Bld: 241 mg/dL — ABNORMAL HIGH (ref 70–99)
Potassium: 3.5 mmol/L (ref 3.5–5.1)
Sodium: 140 mmol/L (ref 135–145)
Total Bilirubin: 0.4 mg/dL (ref 0.3–1.2)
Total Protein: 5.8 g/dL — ABNORMAL LOW (ref 6.5–8.1)

## 2019-03-28 LAB — LACTATE DEHYDROGENASE: LDH: 210 U/L — ABNORMAL HIGH (ref 98–192)

## 2019-03-28 MED ORDER — HOT PACK MISC ONCOLOGY
1.0000 | Freq: Once | Status: DC | PRN
  Filled 2019-03-28: qty 1

## 2019-03-28 MED ORDER — SODIUM CHLORIDE 0.9 % IV SOLN
375.0000 mg/m2 | Freq: Once | INTRAVENOUS | Status: AC
  Administered 2019-03-28: 11:00:00 1000 mg via INTRAVENOUS
  Filled 2019-03-28: qty 100

## 2019-03-28 MED ORDER — SODIUM CHLORIDE 0.9 % IV SOLN
Freq: Once | INTRAVENOUS | Status: AC
  Administered 2019-03-28: 09:00:00 via INTRAVENOUS
  Filled 2019-03-28: qty 250

## 2019-03-28 MED ORDER — SODIUM CHLORIDE 0.9% FLUSH
10.0000 mL | INTRAVENOUS | Status: DC | PRN
  Administered 2019-03-28: 13:00:00 10 mL
  Filled 2019-03-28: qty 10

## 2019-03-28 MED ORDER — DOXORUBICIN HCL CHEMO IV INJECTION 2 MG/ML
50.0000 mg/m2 | Freq: Once | INTRAVENOUS | Status: AC
  Administered 2019-03-28: 10:00:00 130 mg via INTRAVENOUS
  Filled 2019-03-28: qty 65

## 2019-03-28 MED ORDER — COLD PACK MISC ONCOLOGY
1.0000 | Freq: Once | Status: DC | PRN
  Filled 2019-03-28: qty 1

## 2019-03-28 MED ORDER — SODIUM CHLORIDE 0.9 % IV SOLN
750.0000 mg/m2 | Freq: Once | INTRAVENOUS | Status: AC
  Administered 2019-03-28: 1960 mg via INTRAVENOUS
  Filled 2019-03-28: qty 98

## 2019-03-28 MED ORDER — PALONOSETRON HCL INJECTION 0.25 MG/5ML
INTRAVENOUS | Status: AC
  Filled 2019-03-28: qty 5

## 2019-03-28 MED ORDER — HEPARIN SOD (PORK) LOCK FLUSH 100 UNIT/ML IV SOLN
500.0000 [IU] | Freq: Once | INTRAVENOUS | Status: AC | PRN
  Administered 2019-03-28: 500 [IU]
  Filled 2019-03-28: qty 5

## 2019-03-28 MED ORDER — PALONOSETRON HCL INJECTION 0.25 MG/5ML
0.2500 mg | Freq: Once | INTRAVENOUS | Status: AC
  Administered 2019-03-28: 09:00:00 0.25 mg via INTRAVENOUS

## 2019-03-28 MED ORDER — ACETAMINOPHEN 325 MG PO TABS
ORAL_TABLET | ORAL | Status: AC
  Filled 2019-03-28: qty 2

## 2019-03-28 MED ORDER — ACETAMINOPHEN 325 MG PO TABS
650.0000 mg | ORAL_TABLET | Freq: Once | ORAL | Status: AC
  Administered 2019-03-28: 09:00:00 650 mg via ORAL

## 2019-03-28 MED ORDER — DIPHENHYDRAMINE HCL 25 MG PO CAPS
ORAL_CAPSULE | ORAL | Status: AC
  Filled 2019-03-28: qty 2

## 2019-03-28 MED ORDER — DIPHENHYDRAMINE HCL 25 MG PO CAPS
50.0000 mg | ORAL_CAPSULE | Freq: Once | ORAL | Status: AC
  Administered 2019-03-28: 50 mg via ORAL

## 2019-03-28 MED ORDER — SODIUM CHLORIDE 0.9% FLUSH
3.0000 mL | INTRAVENOUS | Status: DC | PRN
  Filled 2019-03-28: qty 10

## 2019-03-28 MED ORDER — AMOXICILLIN-POT CLAVULANATE 875-125 MG PO TABS: 1.0000 | ORAL_TABLET | Freq: Two times a day (BID) | ORAL | 1 refills | Status: DC

## 2019-03-28 MED ORDER — SODIUM CHLORIDE 0.9 % IV SOLN
Freq: Once | INTRAVENOUS | Status: AC
  Administered 2019-03-28: 09:00:00 via INTRAVENOUS
  Filled 2019-03-28: qty 5

## 2019-03-28 NOTE — Progress Notes (Signed)
Hematology and Oncology Follow Up Visit  Ronald Rice [id number] [birth year] 73 y.o. 03/28/2019   Principle Diagnosis:   Diffuse large cell non-Hodgkin's lymphoma-Richter's transformation from CLL  Current Therapy:    R-CHOP-s/p cycle #6 -- started on 11/08/2018     Interim History:  Ronald Rice is back for for follow-up.   He is looking a lot better.  He still has a walking boot on the left foot.  He has been taking physical therapy.  This seems to be over with now.  He does not seem to be having cardiac issues.  His heart rate today is 97.  His blood pressure is under good control.  He has had no problems with nausea or vomiting.  He is eating well.  He has had no change in bowel or bladder habits.  He is not noted any positive hot flashes.  He still has occasional sweats.  We did check his testosterone level when he was last here.  His level was 314.  His LDH is 210 which is stable.  He has had no headache.  He has had no swallowing difficulties.  He has had no mouth sores.  Overall, his performance status is ECOG 1.    Medications:  Current Outpatient Medications:  .  acetaminophen (TYLENOL) 500 MG tablet, Take 1,000 mg by mouth every 6 (six) hours as needed (pain). , Disp: , Rfl:  .  allopurinol (ZYLOPRIM) 300 MG tablet, Take 300 mg by mouth daily., Disp: , Rfl:  .  aspirin EC 81 MG tablet, Take 81 mg by mouth daily., Disp: , Rfl:  .  atenolol (TENORMIN) 25 MG tablet, Take 25 mg by mouth daily. , Disp: , Rfl:  .  atorvastatin (LIPITOR) 40 MG tablet, Take 40 mg by mouth daily., Disp: , Rfl:  .  CINNAMON PO, Take 1 tablet by mouth daily., Disp: , Rfl:  .  diclofenac sodium (VOLTAREN) 1 % GEL, Apply 1 application topically as needed (pain). , Disp: , Rfl:  .  diphenhydrAMINE (SIMPLY SLEEP) 25 MG tablet, Take 25 mg by mouth at bedtime as needed for sleep., Disp: , Rfl:  .  [START ON 03/29/2019] donepezil (ARICEPT ODT) 5 MG disintegrating tablet, Take 5 mg by mouth at bedtime.,  Disp: , Rfl:  .  famotidine (PEPCID) 20 MG tablet, Take 20 mg by mouth at bedtime as needed for heartburn. , Disp: , Rfl:  .  gabapentin (NEURONTIN) 300 MG capsule, 300 mg daily as needed. , Disp: , Rfl:  .  hydrochlorothiazide (HYDRODIURIL) 25 MG tablet, Take 25 mg by mouth daily. , Disp: , Rfl:  .  lidocaine-prilocaine (EMLA) cream, Apply to PAC 1 hour prior to procedure., Disp: 30 g, Rfl: 3 .  lisinopril (ZESTRIL) 40 MG tablet, Take 1 tablet (40 mg total) by mouth daily for 30 days., Disp: 30 tablet, Rfl: 0 .  loratadine (CLARITIN) 10 MG tablet, Take 10 mg by mouth daily., Disp: , Rfl:  .  metFORMIN (GLUCOPHAGE-XR) 500 MG 24 hr tablet, Take 500 mg by mouth every evening. , Disp: , Rfl:  .  NON FORMULARY, Take 2 tablets by mouth daily. Blood Sugar Defense , Disp: , Rfl:  .  potassium chloride (K-DUR) 10 MEQ tablet, Take 10 mEq by mouth daily. , Disp: , Rfl:  .  predniSONE (DELTASONE) 20 MG tablet, Take 3 tablets (60 mg total) by mouth daily. Take on days 1-5 of chemotherapy., Disp: 15 tablet, Rfl: 6 .  tamsulosin (FLOMAX) 0.4 MG CAPS  capsule, Take 0.4 mg by mouth daily., Disp: , Rfl:  .  DULoxetine (CYMBALTA) 30 MG capsule, Take 1 capsule (30 mg total) by mouth daily., Disp: 30 capsule, Rfl: 3 .  ondansetron (ZOFRAN) 8 MG tablet, Take 1 tablet (8 mg total) by mouth 2 (two) times daily as needed for refractory nausea / vomiting. Start on day 3 after cyclophosphamide chemotherapy. (Patient not taking: Reported on 03/28/2019), Disp: 30 tablet, Rfl: 1 .  prochlorperazine (COMPAZINE) 10 MG tablet, Take 1 tablet (10 mg total) by mouth every 6 (six) hours as needed for up to 5 days (Nausea or vomiting)., Disp: 20 tablet, Rfl: 0  Allergies:  No Known Allergies  Past Medical History, Surgical history, Social history, and Family History were reviewed and updated.  Review of Systems: Review of Systems  Constitutional: Negative.   HENT:  Negative.   Eyes: Negative.   Respiratory: Negative.    Cardiovascular: Negative.   Gastrointestinal: Negative.   Endocrine: Negative.   Genitourinary: Positive for nocturia.   Musculoskeletal: Positive for arthralgias, gait problem and myalgias.  Neurological: Positive for gait problem.  Hematological: Negative.   Psychiatric/Behavioral: Negative.     Physical Exam:  weight is 290 lb (131.5 kg). His temporal temperature is 97.3 F (36.3 C) (abnormal). His blood pressure is 141/89 (abnormal) and his pulse is 97. His respiration is 18 and oxygen saturation is 98%.   Wt Readings from Last 3 Encounters:  03/28/19 290 lb (131.5 kg)  03/07/19 281 lb 9.6 oz (127.7 kg)  12/25/18 279 lb 12.2 oz (126.9 kg)    Physical Exam Vitals signs reviewed.  HENT:     Head: Normocephalic and atraumatic.  Eyes:     Pupils: Pupils are equal, round, and reactive to light.  Neck:     Comments: Examination of his neck shows marked improvement in the adenopathy in the left neck.  He still has some palpable posterior cervical lymphadenopathy.  It probably measures about 2 x 2 cm.  It is not as firm.  It is somewhat mobile.. Cardiovascular:     Rate and Rhythm: Normal rate and regular rhythm.     Heart sounds: Normal heart sounds.  Pulmonary:     Effort: Pulmonary effort is normal.     Breath sounds: Normal breath sounds.  Abdominal:     General: Bowel sounds are normal.     Palpations: Abdomen is soft.  Musculoskeletal: Normal range of motion.        General: No tenderness or deformity.     Comments: He has a cast on the right foot and lower leg.  This goes about two thirds the way up his right leg.  Left leg is unremarkable.  He has no swelling.  He has decent pulses.  He has good range of motion of his joints.  Lymphadenopathy:     Cervical: No cervical adenopathy.  Skin:    General: Skin is warm and dry.     Findings: No erythema or rash.  Neurological:     Mental Status: He is alert and oriented to person, place, and time.  Psychiatric:         Behavior: Behavior normal.        Thought Content: Thought content normal.        Judgment: Judgment normal.      Lab Results  Component Value Date   WBC 3.9 (L) 03/28/2019   HGB 11.9 (L) 03/28/2019   HCT 34.1 (L) 03/28/2019   MCV 87.9 03/28/2019  PLT 185 03/28/2019     Chemistry      Component Value Date/Time   NA 141 03/07/2019 0842   NA 142 01/25/2017 1217   K 3.7 03/07/2019 0842   K 4.1 01/25/2017 1217   CL 104 03/07/2019 0842   CL 102 01/25/2017 1217   CO2 28 03/07/2019 0842   CO2 33 01/25/2017 1217   BUN 15 03/07/2019 0842   BUN 12 01/25/2017 1217   CREATININE 1.07 03/07/2019 0842   CREATININE 1.4 (H) 01/25/2017 1217      Component Value Date/Time   CALCIUM 9.3 03/07/2019 0842   CALCIUM 9.7 01/25/2017 1217   ALKPHOS 49 03/07/2019 0842   ALKPHOS 59 01/25/2017 1217   AST 23 03/07/2019 0842   ALT 31 03/07/2019 0842   ALT 32 01/25/2017 1217   BILITOT 0.4 03/07/2019 0842         Impression and Plan: Mr. Holthusen is a 73 year old African-American male.  He was diagnosed with CLL back in [place].  We will go ahead with his seventh cycle of chemotherapy today.    Because of the bulky lymphadenopathy in the left neck,we may have to think about radiation therapy to this area once we get done with systemic treatment.  I truly believe that this will be very helpful for him.  We will plan to get him back in 4 weeks for his eighth and final cycle of treatment.  I would like to give him the week of Thanksgiving off so that he can enjoy Thanksgiving with his family.   His blood sugars are still on the high side.  This hopefully will improve once we have done with his chemotherapy.   Volanda Napoleon, MD 11/4/20208:42 AM

## 2019-03-28 NOTE — Patient Instructions (Addendum)
[facility] Discharge Instructions for Patients Receiving Chemotherapy  Today you received the following chemotherapy agents Rituxan, Adriamycin and Cytoxan  To help prevent nausea and vomiting after your treatment, we encourage you to take your nausea medication as prescribed by your MD.   If you develop nausea and vomiting that is not controlled by your nausea medication, call the clinic.   BELOW ARE SYMPTOMS THAT SHOULD BE REPORTED IMMEDIATELY:  *FEVER GREATER THAN 100.5 F  *CHILLS WITH OR WITHOUT FEVER  NAUSEA AND VOMITING THAT IS NOT CONTROLLED WITH YOUR NAUSEA MEDICATION  *UNUSUAL SHORTNESS OF BREATH  *UNUSUAL BRUISING OR BLEEDING  TENDERNESS IN MOUTH AND THROAT WITH OR WITHOUT PRESENCE OF ULCERS  *URINARY PROBLEMS  *BOWEL PROBLEMS  UNUSUAL RASH Items with * indicate a potential emergency and should be followed up as soon as possible.  Feel free to call the clinic should you have any questions or concerns. The clinic phone number is [phone].  Please show the [facility] at check-in to the Emergency Department and triage nurse.

## 2019-03-29 ENCOUNTER — Telehealth: Payer: Self-pay | Admitting: *Deleted

## 2019-03-29 NOTE — Telephone Encounter (Signed)
Call placed to patient to notify him per order of Dr. Marin Olp that a prescription for Augmentin has been sent in to his pharmacy, [facility] on [address] and to confirm that pt is aware of appt time for Neulasta injection tomorrow.  Pt states that he will pick up prescription for Augmentin today and is aware of appt for tomorrow for injection.

## 2019-03-30 ENCOUNTER — Other Ambulatory Visit: Payer: Self-pay

## 2019-03-30 ENCOUNTER — Inpatient Hospital Stay: Payer: Medicare HMO

## 2019-03-30 VITALS — BP 116/61 | HR 83 | Temp 97.1°F | Resp 16

## 2019-03-30 DIAGNOSIS — C8338 Diffuse large B-cell lymphoma, lymph nodes of multiple sites: Secondary | ICD-10-CM

## 2019-03-30 DIAGNOSIS — Z5111 Encounter for antineoplastic chemotherapy: Secondary | ICD-10-CM | POA: Diagnosis not present

## 2019-03-30 DIAGNOSIS — Z5189 Encounter for other specified aftercare: Secondary | ICD-10-CM | POA: Diagnosis not present

## 2019-03-30 DIAGNOSIS — C9110 Chronic lymphocytic leukemia of B-cell type not having achieved remission: Secondary | ICD-10-CM | POA: Diagnosis not present

## 2019-03-30 DIAGNOSIS — Z5112 Encounter for antineoplastic immunotherapy: Secondary | ICD-10-CM | POA: Diagnosis not present

## 2019-03-30 MED ORDER — PEGFILGRASTIM-CBQV 6 MG/0.6ML ~~LOC~~ SOSY
6.0000 mg | PREFILLED_SYRINGE | Freq: Once | SUBCUTANEOUS | Status: AC
  Administered 2019-03-30: 6 mg via SUBCUTANEOUS

## 2019-03-30 NOTE — Patient Instructions (Signed)

## 2019-04-02 DIAGNOSIS — C9110 Chronic lymphocytic leukemia of B-cell type not having achieved remission: Secondary | ICD-10-CM | POA: Diagnosis not present

## 2019-04-02 DIAGNOSIS — I2510 Atherosclerotic heart disease of native coronary artery without angina pectoris: Secondary | ICD-10-CM | POA: Diagnosis not present

## 2019-04-02 DIAGNOSIS — E119 Type 2 diabetes mellitus without complications: Secondary | ICD-10-CM | POA: Diagnosis not present

## 2019-04-02 DIAGNOSIS — I10 Essential (primary) hypertension: Secondary | ICD-10-CM | POA: Diagnosis not present

## 2019-04-07 DIAGNOSIS — G4733 Obstructive sleep apnea (adult) (pediatric): Secondary | ICD-10-CM | POA: Diagnosis not present

## 2019-04-11 ENCOUNTER — Other Ambulatory Visit: Payer: Self-pay

## 2019-04-12 DIAGNOSIS — H25813 Combined forms of age-related cataract, bilateral: Secondary | ICD-10-CM | POA: Diagnosis not present

## 2019-04-12 DIAGNOSIS — H5202 Hypermetropia, left eye: Secondary | ICD-10-CM | POA: Diagnosis not present

## 2019-04-12 DIAGNOSIS — H524 Presbyopia: Secondary | ICD-10-CM | POA: Diagnosis not present

## 2019-04-12 DIAGNOSIS — H16223 Keratoconjunctivitis sicca, not specified as Sjogren's, bilateral: Secondary | ICD-10-CM | POA: Diagnosis not present

## 2019-04-12 DIAGNOSIS — E119 Type 2 diabetes mellitus without complications: Secondary | ICD-10-CM | POA: Diagnosis not present

## 2019-04-12 DIAGNOSIS — H40013 Open angle with borderline findings, low risk, bilateral: Secondary | ICD-10-CM | POA: Diagnosis not present

## 2019-04-15 ENCOUNTER — Other Ambulatory Visit: Payer: Self-pay | Admitting: Hematology

## 2019-04-15 ENCOUNTER — Other Ambulatory Visit: Payer: Self-pay | Admitting: Hematology & Oncology

## 2019-04-15 DIAGNOSIS — S8262XA Displaced fracture of lateral malleolus of left fibula, initial encounter for closed fracture: Secondary | ICD-10-CM | POA: Diagnosis not present

## 2019-04-15 DIAGNOSIS — C8338 Diffuse large B-cell lymphoma, lymph nodes of multiple sites: Secondary | ICD-10-CM

## 2019-04-15 MED ORDER — PREDNISONE 20 MG PO TABS: 60.0000 mg | ORAL_TABLET | Freq: Every day | ORAL | 6 refills | Status: DC

## 2019-04-15 NOTE — Telephone Encounter (Signed)
Pt called tonight and reported that he run out of a medicine (I assume is prednisone) he is supposed to take daily for 5 days along with his iv chemo. He is due for chemo tomorrow. I reviewed his chart, he is on R-CHOP. I called in prednisone 20mg  #15, 3 tabs daily for 5 days. He will pick up tomorrow morning before his chemo appointment.   Truitt Merle  04/15/2019

## 2019-04-16 ENCOUNTER — Telehealth: Payer: Self-pay | Admitting: Neurology

## 2019-04-16 ENCOUNTER — Ambulatory Visit: Payer: Medicare HMO

## 2019-04-16 ENCOUNTER — Other Ambulatory Visit: Payer: Medicare HMO

## 2019-04-16 ENCOUNTER — Ambulatory Visit: Payer: Medicare HMO | Admitting: Hematology & Oncology

## 2019-04-16 NOTE — Telephone Encounter (Signed)
Received an email from [employer] stating the pt has not been using the machine and they are issuing a pick up ticket for the machine due to noncompliance. If the pt decides he would like to start using again would have to completed process all over again.

## 2019-04-18 ENCOUNTER — Ambulatory Visit: Payer: Medicare HMO

## 2019-04-25 ENCOUNTER — Inpatient Hospital Stay: Payer: Medicare HMO | Attending: Hematology & Oncology | Admitting: Hematology & Oncology

## 2019-04-25 ENCOUNTER — Telehealth: Payer: Self-pay | Admitting: Hematology & Oncology

## 2019-04-25 ENCOUNTER — Other Ambulatory Visit: Payer: Self-pay

## 2019-04-25 ENCOUNTER — Inpatient Hospital Stay: Payer: Medicare HMO

## 2019-04-25 ENCOUNTER — Encounter: Payer: Self-pay | Admitting: Hematology & Oncology

## 2019-04-25 VITALS — BP 128/85 | HR 83 | Temp 96.9°F | Resp 20

## 2019-04-25 VITALS — BP 138/83 | HR 88 | Temp 97.5°F | Resp 20 | Wt 284.1 lb

## 2019-04-25 DIAGNOSIS — M7989 Other specified soft tissue disorders: Secondary | ICD-10-CM | POA: Insufficient documentation

## 2019-04-25 DIAGNOSIS — Z5111 Encounter for antineoplastic chemotherapy: Secondary | ICD-10-CM | POA: Diagnosis not present

## 2019-04-25 DIAGNOSIS — C8338 Diffuse large B-cell lymphoma, lymph nodes of multiple sites: Secondary | ICD-10-CM

## 2019-04-25 DIAGNOSIS — C9110 Chronic lymphocytic leukemia of B-cell type not having achieved remission: Secondary | ICD-10-CM | POA: Diagnosis not present

## 2019-04-25 DIAGNOSIS — Z5112 Encounter for antineoplastic immunotherapy: Secondary | ICD-10-CM | POA: Insufficient documentation

## 2019-04-25 DIAGNOSIS — Z5189 Encounter for other specified aftercare: Secondary | ICD-10-CM | POA: Diagnosis not present

## 2019-04-25 LAB — CMP (CANCER CENTER ONLY)
ALT: 28 U/L (ref 0–44)
AST: 22 U/L (ref 15–41)
Albumin: 4.3 g/dL (ref 3.5–5.0)
Alkaline Phosphatase: 57 U/L (ref 38–126)
Anion gap: 9 (ref 5–15)
BUN: 14 mg/dL (ref 8–23)
CO2: 26 mmol/L (ref 22–32)
Calcium: 8.8 mg/dL — ABNORMAL LOW (ref 8.9–10.3)
Chloride: 104 mmol/L (ref 98–111)
Creatinine: 0.93 mg/dL (ref 0.61–1.24)
GFR, Est AFR Am: >60 mL/min (ref >60)
GFR, Estimated: >60 mL/min (ref >60)
Glucose, Bld: 205 mg/dL — ABNORMAL HIGH (ref 70–99)
Potassium: 3.7 mmol/L (ref 3.5–5.1)
Sodium: 139 mmol/L (ref 135–145)
Total Bilirubin: 0.4 mg/dL (ref 0.3–1.2)
Total Protein: 6.3 g/dL — ABNORMAL LOW (ref 6.5–8.1)

## 2019-04-25 LAB — CBC WITH DIFFERENTIAL (CANCER CENTER ONLY)
Abs Immature Granulocytes: 0.02 10*3/uL (ref 0.00–0.07)
Basophils Absolute: 0 10*3/uL (ref 0.0–0.1)
Basophils Relative: 1 %
Eosinophils Absolute: 0.1 10*3/uL (ref 0.0–0.5)
Eosinophils Relative: 3 %
HCT: 34.3 % — ABNORMAL LOW (ref 39.0–52.0)
Hemoglobin: 12.1 g/dL — ABNORMAL LOW (ref 13.0–17.0)
Immature Granulocytes: 1 %
Lymphocytes Relative: 26 %
Lymphs Abs: 0.9 10*3/uL (ref 0.7–4.0)
MCH: 31.1 pg (ref 26.0–34.0)
MCHC: 35.3 g/dL (ref 30.0–36.0)
MCV: 88.2 fL (ref 80.0–100.0)
Monocytes Absolute: 0.9 10*3/uL (ref 0.1–1.0)
Monocytes Relative: 26 %
Neutro Abs: 1.5 10*3/uL — ABNORMAL LOW (ref 1.7–7.7)
Neutrophils Relative %: 43 %
Platelet Count: 189 10*3/uL (ref 150–400)
RBC: 3.89 MIL/uL — ABNORMAL LOW (ref 4.22–5.81)
RDW: 14 % (ref 11.5–15.5)
WBC Count: 3.5 10*3/uL — ABNORMAL LOW (ref 4.0–10.5)
nRBC: 0 % (ref 0.0–0.2)

## 2019-04-25 LAB — LACTATE DEHYDROGENASE: LDH: 190 U/L (ref 98–192)

## 2019-04-25 MED ORDER — HEPARIN SOD (PORK) LOCK FLUSH 100 UNIT/ML IV SOLN
500.0000 [IU] | Freq: Once | INTRAVENOUS | Status: AC | PRN
  Administered 2019-04-25: 500 [IU]
  Filled 2019-04-25: qty 5

## 2019-04-25 MED ORDER — SODIUM CHLORIDE 0.9 % IV SOLN
750.0000 mg/m2 | Freq: Once | INTRAVENOUS | Status: AC
  Administered 2019-04-25: 1960 mg via INTRAVENOUS
  Filled 2019-04-25: qty 98

## 2019-04-25 MED ORDER — PALONOSETRON HCL INJECTION 0.25 MG/5ML
0.2500 mg | Freq: Once | INTRAVENOUS | Status: AC
  Administered 2019-04-25: 0.25 mg via INTRAVENOUS

## 2019-04-25 MED ORDER — ACETAMINOPHEN 325 MG PO TABS
ORAL_TABLET | ORAL | Status: AC
  Filled 2019-04-25: qty 2

## 2019-04-25 MED ORDER — PALONOSETRON HCL INJECTION 0.25 MG/5ML
INTRAVENOUS | Status: AC
  Filled 2019-04-25: qty 5

## 2019-04-25 MED ORDER — SODIUM CHLORIDE 0.9 % IV SOLN
Freq: Once | INTRAVENOUS | Status: AC
  Administered 2019-04-25: 09:00:00 via INTRAVENOUS
  Filled 2019-04-25: qty 250

## 2019-04-25 MED ORDER — SODIUM CHLORIDE 0.9% FLUSH
10.0000 mL | INTRAVENOUS | Status: DC | PRN
  Administered 2019-04-25: 10 mL
  Filled 2019-04-25: qty 10

## 2019-04-25 MED ORDER — DOXORUBICIN HCL CHEMO IV INJECTION 2 MG/ML
50.0000 mg/m2 | Freq: Once | INTRAVENOUS | Status: AC
  Administered 2019-04-25: 130 mg via INTRAVENOUS
  Filled 2019-04-25: qty 65

## 2019-04-25 MED ORDER — SODIUM CHLORIDE 0.9 % IV SOLN
375.0000 mg/m2 | Freq: Once | INTRAVENOUS | Status: AC
  Administered 2019-04-25: 1000 mg via INTRAVENOUS
  Filled 2019-04-25: qty 100

## 2019-04-25 MED ORDER — DIPHENHYDRAMINE HCL 25 MG PO CAPS
50.0000 mg | ORAL_CAPSULE | Freq: Once | ORAL | Status: AC
  Administered 2019-04-25: 50 mg via ORAL

## 2019-04-25 MED ORDER — SODIUM CHLORIDE 0.9 % IV SOLN
Freq: Once | INTRAVENOUS | Status: AC
  Administered 2019-04-25: 10:00:00 via INTRAVENOUS
  Filled 2019-04-25: qty 5

## 2019-04-25 MED ORDER — ACETAMINOPHEN 325 MG PO TABS
650.0000 mg | ORAL_TABLET | Freq: Once | ORAL | Status: AC
  Administered 2019-04-25: 650 mg via ORAL

## 2019-04-25 MED ORDER — DIPHENHYDRAMINE HCL 25 MG PO CAPS
ORAL_CAPSULE | ORAL | Status: AC
  Filled 2019-04-25: qty 2

## 2019-04-25 NOTE — Progress Notes (Signed)
0900 Patient presents with swollen, tender left little finger due to ring being too tight. Requests staff to cut off.  Consent obtained for ring cutting.  Dr. Marin Olp at bedside, beaver saw obtained from ED. Ring cut in two places to remove.  Ring and gold pieces returned to patient. Instructed to keep clean and dry and apply neosporin as needed.

## 2019-04-25 NOTE — Patient Instructions (Signed)
[facility] Discharge Instructions for Patients Receiving Chemotherapy  Today you received the following chemotherapy agents Rituxan, Adriamycin and Cytoxan  To help prevent nausea and vomiting after your treatment, we encourage you to take your nausea medication as prescribed by your MD.   If you develop nausea and vomiting that is not controlled by your nausea medication, call the clinic.   BELOW ARE SYMPTOMS THAT SHOULD BE REPORTED IMMEDIATELY:  *FEVER GREATER THAN 100.5 F  *CHILLS WITH OR WITHOUT FEVER  NAUSEA AND VOMITING THAT IS NOT CONTROLLED WITH YOUR NAUSEA MEDICATION  *UNUSUAL SHORTNESS OF BREATH  *UNUSUAL BRUISING OR BLEEDING  TENDERNESS IN MOUTH AND THROAT WITH OR WITHOUT PRESENCE OF ULCERS  *URINARY PROBLEMS  *BOWEL PROBLEMS  UNUSUAL RASH Items with * indicate a potential emergency and should be followed up as soon as possible.  Feel free to call the clinic should you have any questions or concerns. The clinic phone number is [phone].  Please show the [facility] at check-in to the Emergency Department and triage nurse.

## 2019-04-25 NOTE — Progress Notes (Signed)
Hematology and Oncology Follow Up Visit  Ronald Rice [id number] May 04, 1946 73 y.o. 04/25/2019   Principle Diagnosis:   Diffuse large cell non-Hodgkin's lymphoma-Richter's transformation from CLL  Current Therapy:    R-CHOP-s/p cycle #7 -- started on 11/08/2018     Interim History:  Ronald Rice is back for for follow-up.  The problem that he has today is that he has swelling in the fifth digit of his right hand.  He has a ring on that finger.  It is basically cutting into his skin.  We actually had to get a ring cutter from the emergency room.  Ronald Rice, my nurse today, did a fantastic job in getting the ring off.  This helped him tremendously.  This helped his pain.  We will put a little ice on that finger to try to help with some of the swelling.  This will be his last cycle of R-CHOP.  After this eighth cycle, we will plan for a PET scan.  He still has the walking boot on the left foot.  He goes to the orthopedist this Friday.  He did have a nice Thanksgiving.  Nausea has not been too bad for him.  He has not had any issues with bowels or bladder.  There is been no bleeding.  He has had no fever.  Taking this ring off his right fifth finger really helped him out quite a bit.  Overall, his performance status is ECOG 1.      Medications:  Current Outpatient Medications:    acetaminophen (TYLENOL) 500 MG tablet, Take 1,000 mg by mouth every 6 (six) hours as needed (pain). , Disp: , Rfl:    allopurinol (ZYLOPRIM) 300 MG tablet, Take 300 mg by mouth daily., Disp: , Rfl:    aspirin EC 81 MG tablet, Take 81 mg by mouth daily., Disp: , Rfl:    atenolol (TENORMIN) 25 MG tablet, Take 25 mg by mouth daily. , Disp: , Rfl:    atorvastatin (LIPITOR) 40 MG tablet, Take 40 mg by mouth daily., Disp: , Rfl:    CINNAMON PO, Take 1 tablet by mouth daily., Disp: , Rfl:    diclofenac sodium (VOLTAREN) 1 % GEL, Apply 1 application topically as needed (pain). , Disp: , Rfl:     diphenhydrAMINE (SIMPLY SLEEP) 25 MG tablet, Take 25 mg by mouth at bedtime as needed for sleep., Disp: , Rfl:    donepezil (ARICEPT ODT) 5 MG disintegrating tablet, Take 5 mg by mouth at bedtime., Disp: , Rfl:    DULoxetine (CYMBALTA) 30 MG capsule, Take 1 capsule (30 mg total) by mouth daily., Disp: 30 capsule, Rfl: 3   famotidine (PEPCID) 20 MG tablet, Take 20 mg by mouth at bedtime as needed for heartburn. , Disp: , Rfl:    hydrochlorothiazide (HYDRODIURIL) 25 MG tablet, Take 25 mg by mouth daily. , Disp: , Rfl:    lidocaine-prilocaine (EMLA) cream, Apply to PAC 1 hour prior to procedure., Disp: 30 g, Rfl: 3   lisinopril (ZESTRIL) 40 MG tablet, Take 1 tablet (40 mg total) by mouth daily for 30 days., Disp: 30 tablet, Rfl: 0   loratadine (CLARITIN) 10 MG tablet, Take 10 mg by mouth daily., Disp: , Rfl:    metFORMIN (GLUCOPHAGE-XR) 500 MG 24 hr tablet, Take 500 mg by mouth every evening. , Disp: , Rfl:    NON FORMULARY, Take 2 tablets by mouth daily. Blood Sugar Defense , Disp: , Rfl:    potassium chloride (K-DUR) 10 MEQ tablet,  Take 10 mEq by mouth daily. , Disp: , Rfl:    tamsulosin (FLOMAX) 0.4 MG CAPS capsule, Take 0.4 mg by mouth daily., Disp: , Rfl:    amoxicillin-clavulanate (AUGMENTIN) 875-125 MG tablet, Take 1 tablet by mouth 2 (two) times daily., Disp: 30 tablet, Rfl: 1   gabapentin (NEURONTIN) 300 MG capsule, 300 mg., Disp: , Rfl:    ondansetron (ZOFRAN) 8 MG tablet, Take 1 tablet (8 mg total) by mouth 2 (two) times daily as needed for refractory nausea / vomiting. Start on day 3 after cyclophosphamide chemotherapy. (Patient not taking: Reported on 04/25/2019), Disp: 30 tablet, Rfl: 1   predniSONE (DELTASONE) 20 MG tablet, TAKE 3 TABLETS BY MOUTH DAILY. TAKE ON DAYS 1 THROUGH 5 OF CHEMOTHERAPY (Patient not taking: Reported on 04/25/2019), Disp: 15 tablet, Rfl: 6   prochlorperazine (COMPAZINE) 10 MG tablet, Take 1 tablet (10 mg total) by mouth every 6 (six) hours as needed  for up to 5 days (Nausea or vomiting)., Disp: 20 tablet, Rfl: 0  Allergies:  No Known Allergies  Past Medical History, Surgical history, Social history, and Family History were reviewed and updated.  Review of Systems: Review of Systems  Constitutional: Negative.   HENT:  Negative.   Eyes: Negative.   Respiratory: Negative.   Cardiovascular: Negative.   Gastrointestinal: Negative.   Endocrine: Negative.   Genitourinary: Positive for nocturia.   Musculoskeletal: Positive for arthralgias, gait problem and myalgias.  Neurological: Positive for gait problem.  Hematological: Negative.   Psychiatric/Behavioral: Negative.     Physical Exam:  weight is 284 lb 1.9 oz (128.9 kg). His temporal temperature is 97.5 F (36.4 C) (abnormal). His blood pressure is 138/83 and his pulse is 88. His respiration is 20 and oxygen saturation is 99%.   Wt Readings from Last 3 Encounters:  04/25/19 284 lb 1.9 oz (128.9 kg)  03/28/19 290 lb (131.5 kg)  03/07/19 281 lb 9.6 oz (127.7 kg)    Physical Exam Vitals signs reviewed.  HENT:     Head: Normocephalic and atraumatic.  Eyes:     Pupils: Pupils are equal, round, and reactive to light.  Neck:     Comments: Examination of his neck shows marked improvement in the adenopathy in the left neck.  He still has some palpable posterior cervical lymphadenopathy.  It probably measures about 2 x 2 cm.  It is not as firm.  It is somewhat mobile.. Cardiovascular:     Rate and Rhythm: Normal rate and regular rhythm.     Heart sounds: Normal heart sounds.  Pulmonary:     Effort: Pulmonary effort is normal.     Breath sounds: Normal breath sounds.  Abdominal:     General: Bowel sounds are normal.     Palpations: Abdomen is soft.  Musculoskeletal: Normal range of motion.        General: No tenderness or deformity.     Comments: He has a cast on the right foot and lower leg.  This goes about two thirds the way up his right leg.  Left leg is unremarkable.  He  has no swelling.  He has decent pulses.  He has good range of motion of his joints.  Lymphadenopathy:     Cervical: No cervical adenopathy.  Skin:    General: Skin is warm and dry.     Findings: No erythema or rash.  Neurological:     Mental Status: He is alert and oriented to person, place, and time.  Psychiatric:  Behavior: Behavior normal.        Thought Content: Thought content normal.        Judgment: Judgment normal.      Lab Results  Component Value Date   WBC 3.5 (L) 04/25/2019   HGB 12.1 (L) 04/25/2019   HCT 34.3 (L) 04/25/2019   MCV 88.2 04/25/2019   PLT 189 04/25/2019     Chemistry      Component Value Date/Time   NA 140 03/28/2019 0821   NA 142 01/25/2017 1217   K 3.5 03/28/2019 0821   K 4.1 01/25/2017 1217   CL 103 03/28/2019 0821   CL 102 01/25/2017 1217   CO2 28 03/28/2019 0821   CO2 33 01/25/2017 1217   BUN 10 03/28/2019 0821   BUN 12 01/25/2017 1217   CREATININE 1.07 03/28/2019 0821   CREATININE 1.4 (H) 01/25/2017 1217      Component Value Date/Time   CALCIUM 8.9 03/28/2019 0821   CALCIUM 9.7 01/25/2017 1217   ALKPHOS 54 03/28/2019 0821   ALKPHOS 59 01/25/2017 1217   AST 26 03/28/2019 0821   ALT 32 03/28/2019 0821   ALT 32 01/25/2017 1217   BILITOT 0.4 03/28/2019 0821         Impression and Plan: Mr. Perpich is a 73 year old African-American male.  He was diagnosed with CLL back in [place].  Once again, this will be his eighth and final cycle of treatment.  After this, we will do a PET scan.  Apply get a PET scan in about 4 weeks or so.  I still believe that he will need radiation therapy to his neck.  This where he had a bulky adenopathy.  I think that if he recurs, this would be where he would more likely recur.  We had to spend quite a bit of time with him today.  It took about 15 minutes to get the ring off.  We spent almost 45 minutes with him today.    Volanda Napoleon, MD 12/2/20208:57 AM

## 2019-04-25 NOTE — Progress Notes (Signed)
8:56 AM Requests staff to cut off ring from right little finger. Consent signed. Dr. Marin Olp notified.

## 2019-04-25 NOTE — Telephone Encounter (Signed)
Appointments scheduled calendar to be printed by infusion per 12/2 los

## 2019-04-27 ENCOUNTER — Other Ambulatory Visit: Payer: Self-pay

## 2019-04-27 ENCOUNTER — Inpatient Hospital Stay: Payer: Medicare HMO

## 2019-04-27 VITALS — BP 116/66 | HR 84 | Temp 97.1°F | Resp 19

## 2019-04-27 DIAGNOSIS — C9110 Chronic lymphocytic leukemia of B-cell type not having achieved remission: Secondary | ICD-10-CM | POA: Diagnosis not present

## 2019-04-27 DIAGNOSIS — Z5111 Encounter for antineoplastic chemotherapy: Secondary | ICD-10-CM | POA: Diagnosis not present

## 2019-04-27 DIAGNOSIS — M7989 Other specified soft tissue disorders: Secondary | ICD-10-CM | POA: Diagnosis not present

## 2019-04-27 DIAGNOSIS — S8262XH Displaced fracture of lateral malleolus of left fibula, subsequent encounter for open fracture type I or II with delayed healing: Secondary | ICD-10-CM | POA: Diagnosis not present

## 2019-04-27 DIAGNOSIS — C8338 Diffuse large B-cell lymphoma, lymph nodes of multiple sites: Secondary | ICD-10-CM

## 2019-04-27 DIAGNOSIS — Z5189 Encounter for other specified aftercare: Secondary | ICD-10-CM | POA: Diagnosis not present

## 2019-04-27 DIAGNOSIS — Z5112 Encounter for antineoplastic immunotherapy: Secondary | ICD-10-CM | POA: Diagnosis not present

## 2019-04-27 MED ORDER — PEGFILGRASTIM-CBQV 6 MG/0.6ML ~~LOC~~ SOSY
PREFILLED_SYRINGE | SUBCUTANEOUS | Status: AC
  Filled 2019-04-27: qty 0.6

## 2019-04-27 MED ORDER — PEGFILGRASTIM-CBQV 6 MG/0.6ML ~~LOC~~ SOSY
6.0000 mg | PREFILLED_SYRINGE | Freq: Once | SUBCUTANEOUS | Status: AC
  Administered 2019-04-27: 6 mg via SUBCUTANEOUS

## 2019-04-27 NOTE — Patient Instructions (Signed)

## 2019-05-03 ENCOUNTER — Other Ambulatory Visit: Payer: Self-pay | Admitting: Hematology & Oncology

## 2019-05-15 DIAGNOSIS — S8262XA Displaced fracture of lateral malleolus of left fibula, initial encounter for closed fracture: Secondary | ICD-10-CM | POA: Diagnosis not present

## 2019-05-30 ENCOUNTER — Other Ambulatory Visit: Payer: Self-pay

## 2019-05-30 ENCOUNTER — Encounter (HOSPITAL_COMMUNITY)
Admission: RE | Admit: 2019-05-30 | Discharge: 2019-05-30 | Disposition: A | Discharge status: Home / Self Care | Payer: Medicare HMO | Source: Ambulatory Visit | Attending: Hematology & Oncology | Admitting: Hematology & Oncology

## 2019-05-30 DIAGNOSIS — C8338 Diffuse large B-cell lymphoma, lymph nodes of multiple sites: Secondary | ICD-10-CM

## 2019-05-30 DIAGNOSIS — Z79899 Other long term (current) drug therapy: Secondary | ICD-10-CM | POA: Insufficient documentation

## 2019-05-30 DIAGNOSIS — I700 Atherosclerosis of aorta: Secondary | ICD-10-CM | POA: Insufficient documentation

## 2019-05-30 DIAGNOSIS — Z7982 Long term (current) use of aspirin: Secondary | ICD-10-CM | POA: Diagnosis not present

## 2019-05-30 DIAGNOSIS — C8330 Diffuse large B-cell lymphoma, unspecified site: Secondary | ICD-10-CM | POA: Diagnosis not present

## 2019-05-30 DIAGNOSIS — C8331 Diffuse large B-cell lymphoma, lymph nodes of head, face, and neck: Secondary | ICD-10-CM | POA: Diagnosis not present

## 2019-05-30 LAB — GLUCOSE, CAPILLARY: Glucose-Capillary: 149 mg/dL — ABNORMAL HIGH (ref 70–99)

## 2019-05-30 MED ORDER — FLUDEOXYGLUCOSE F - 18 (FDG) INJECTION
13.8800 | Freq: Once | INTRAVENOUS | Status: AC
  Administered 2019-05-30: 13.88 via INTRAVENOUS

## 2019-06-05 ENCOUNTER — Telehealth: Payer: Self-pay | Admitting: *Deleted

## 2019-06-05 NOTE — Telephone Encounter (Signed)
Message received from patient requesting PET scan results.  Call placed back to patient and patient notified per order of Dr. Marin Olp that "there is some activity in the left neck.  Radiation therapy will be needed to help this and will be discussed with him at his next appt this Thursday."  Pt appreciative of call and states that he can not make appt on Thursday.  Pt transferred to scheduling.

## 2019-06-07 ENCOUNTER — Other Ambulatory Visit: Payer: Medicare HMO

## 2019-06-07 ENCOUNTER — Ambulatory Visit: Payer: Medicare HMO | Admitting: Hematology & Oncology

## 2019-06-07 ENCOUNTER — Other Ambulatory Visit: Payer: Self-pay | Admitting: *Deleted

## 2019-06-08 DIAGNOSIS — M25511 Pain in right shoulder: Secondary | ICD-10-CM | POA: Diagnosis not present

## 2019-06-08 DIAGNOSIS — M25551 Pain in right hip: Secondary | ICD-10-CM | POA: Diagnosis not present

## 2019-06-08 DIAGNOSIS — S8262XG Displaced fracture of lateral malleolus of left fibula, subsequent encounter for closed fracture with delayed healing: Secondary | ICD-10-CM | POA: Diagnosis not present

## 2019-06-12 ENCOUNTER — Encounter: Payer: Self-pay | Admitting: Hematology & Oncology

## 2019-06-12 ENCOUNTER — Inpatient Hospital Stay: Payer: Medicare HMO

## 2019-06-12 ENCOUNTER — Inpatient Hospital Stay (HOSPITAL_BASED_OUTPATIENT_CLINIC_OR_DEPARTMENT_OTHER): Payer: Medicare HMO | Admitting: Hematology & Oncology

## 2019-06-12 ENCOUNTER — Other Ambulatory Visit: Payer: Self-pay

## 2019-06-12 ENCOUNTER — Inpatient Hospital Stay: Payer: Medicare HMO | Attending: Hematology & Oncology

## 2019-06-12 VITALS — BP 174/92 | HR 88 | Temp 97.9°F | Resp 18 | Ht 74.0 in | Wt 273.0 lb

## 2019-06-12 DIAGNOSIS — C8338 Diffuse large B-cell lymphoma, lymph nodes of multiple sites: Secondary | ICD-10-CM

## 2019-06-12 DIAGNOSIS — C9110 Chronic lymphocytic leukemia of B-cell type not having achieved remission: Secondary | ICD-10-CM | POA: Insufficient documentation

## 2019-06-12 DIAGNOSIS — E1140 Type 2 diabetes mellitus with diabetic neuropathy, unspecified: Secondary | ICD-10-CM | POA: Diagnosis not present

## 2019-06-12 LAB — CMP (CANCER CENTER ONLY)
ALT: 23 U/L (ref 0–44)
AST: 20 U/L (ref 15–41)
Albumin: 4.3 g/dL (ref 3.5–5.0)
Alkaline Phosphatase: 61 U/L (ref 38–126)
Anion gap: 9 (ref 5–15)
BUN: 11 mg/dL (ref 8–23)
CO2: 27 mmol/L (ref 22–32)
Calcium: 9.7 mg/dL (ref 8.9–10.3)
Chloride: 103 mmol/L (ref 98–111)
Creatinine: 1.16 mg/dL (ref 0.61–1.24)
GFR, Est AFR Am: >60 mL/min (ref >60)
GFR, Estimated: >60 mL/min (ref >60)
Glucose, Bld: 258 mg/dL — ABNORMAL HIGH (ref 70–99)
Potassium: 3.3 mmol/L — ABNORMAL LOW (ref 3.5–5.1)
Sodium: 139 mmol/L (ref 135–145)
Total Bilirubin: 0.5 mg/dL (ref 0.3–1.2)
Total Protein: 6.5 g/dL (ref 6.5–8.1)

## 2019-06-12 LAB — CBC WITH DIFFERENTIAL (CANCER CENTER ONLY)
Abs Immature Granulocytes: 0.02 10*3/uL (ref 0.00–0.07)
Basophils Absolute: 0 10*3/uL (ref 0.0–0.1)
Basophils Relative: 1 %
Eosinophils Absolute: 0.2 10*3/uL (ref 0.0–0.5)
Eosinophils Relative: 7 %
HCT: 38.5 % — ABNORMAL LOW (ref 39.0–52.0)
Hemoglobin: 13.5 g/dL (ref 13.0–17.0)
Immature Granulocytes: 1 %
Lymphocytes Relative: 35 %
Lymphs Abs: 1.2 10*3/uL (ref 0.7–4.0)
MCH: 30.7 pg (ref 26.0–34.0)
MCHC: 35.1 g/dL (ref 30.0–36.0)
MCV: 87.5 fL (ref 80.0–100.0)
Monocytes Absolute: 0.5 10*3/uL (ref 0.1–1.0)
Monocytes Relative: 14 %
Neutro Abs: 1.5 10*3/uL — ABNORMAL LOW (ref 1.7–7.7)
Neutrophils Relative %: 42 %
Platelet Count: 169 10*3/uL (ref 150–400)
RBC: 4.4 MIL/uL (ref 4.22–5.81)
RDW: 12.6 % (ref 11.5–15.5)
WBC Count: 3.4 10*3/uL — ABNORMAL LOW (ref 4.0–10.5)
nRBC: 0 % (ref 0.0–0.2)

## 2019-06-12 LAB — LACTATE DEHYDROGENASE: LDH: 236 U/L — ABNORMAL HIGH (ref 98–192)

## 2019-06-12 MED ORDER — VITAMIN B-6 250 MG PO TABS: 250.0000 mg | ORAL_TABLET | Freq: Every day | ORAL | 6 refills | Status: DC

## 2019-06-12 NOTE — Patient Instructions (Signed)

## 2019-06-12 NOTE — Progress Notes (Signed)
Hematology and Oncology Follow Up Visit  Ronald Rice [id number] 1946-03-15 74 y.o. 06/12/2019   Principle Diagnosis:   Diffuse large cell non-Hodgkin's lymphoma-Richter's transformation from CLL  Current Therapy:    R-CHOP-s/p cycle #8-- started on 11/08/2018     Interim History:  [name] is back for for follow-up.  Overall, he is doing pretty well.  We did go ahead and do a PET scan on him.  The PET scan was done on January 6.  The PET scan seem to show some low-level activity in the left neck.  Everything else looked fine.  I am really not surprised by this activity.  This is where he presented with bulky disease.  He clearly will need radiation therapy.  I will have to speak with Radiation Oncology and see about getting him in for radiation.  His diabetes, I think in the long-term, is going to be his biggest problem.  He has neuropathy.  He says he does not does not see anybody for his diabetes.  I find this hard to believe.  I told him that if his blood sugars are not under under better control, that he will develop kidney failure and other issues that will be permanent.  He has had no fever.  He has had no cough.  Thankfully his left foot is doing a lot better.  The cast is off now.  He is able to walk.  There is been no problems with bleeding.  He has had no change in bowel or bladder habits.    Overall, his performance status is ECOG 1.      Medications:  Current Outpatient Medications:  .  acetaminophen (TYLENOL) 500 MG tablet, Take 1,000 mg by mouth every 6 (six) hours as needed (pain). , Disp: , Rfl:  .  allopurinol (ZYLOPRIM) 300 MG tablet, Take 300 mg by mouth daily., Disp: , Rfl:  .  amoxicillin-clavulanate (AUGMENTIN) 875-125 MG tablet, Take 1 tablet by mouth 2 (two) times daily., Disp: 30 tablet, Rfl: 1 .  aspirin EC 81 MG tablet, Take 81 mg by mouth daily., Disp: , Rfl:  .  atenolol (TENORMIN) 25 MG tablet, Take 25 mg by mouth daily. , Disp: , Rfl:  .   atorvastatin (LIPITOR) 40 MG tablet, Take 40 mg by mouth daily., Disp: , Rfl:  .  CINNAMON PO, Take 1 tablet by mouth daily., Disp: , Rfl:  .  diclofenac sodium (VOLTAREN) 1 % GEL, Apply 1 application topically as needed (pain). , Disp: , Rfl:  .  diphenhydrAMINE (SIMPLY SLEEP) 25 MG tablet, Take 25 mg by mouth at bedtime as needed for sleep., Disp: , Rfl:  .  donepezil (ARICEPT ODT) 5 MG disintegrating tablet, Take 5 mg by mouth at bedtime., Disp: , Rfl:  .  DULoxetine (CYMBALTA) 30 MG capsule, Take 1 capsule (30 mg total) by mouth daily., Disp: 30 capsule, Rfl: 3 .  famotidine (PEPCID) 20 MG tablet, Take 20 mg by mouth at bedtime as needed for heartburn. , Disp: , Rfl:  .  gabapentin (NEURONTIN) 300 MG capsule, 300 mg., Disp: , Rfl:  .  hydrochlorothiazide (HYDRODIURIL) 25 MG tablet, Take 25 mg by mouth daily. , Disp: , Rfl:  .  lidocaine-prilocaine (EMLA) cream, Apply to PAC 1 hour prior to procedure., Disp: 30 g, Rfl: 3 .  lisinopril (ZESTRIL) 40 MG tablet, Take 1 tablet (40 mg total) by mouth daily for 30 days., Disp: 30 tablet, Rfl: 0 .  loratadine (CLARITIN) 10 MG tablet,  Take 10 mg by mouth daily., Disp: , Rfl:  .  metFORMIN (GLUCOPHAGE-XR) 500 MG 24 hr tablet, Take 500 mg by mouth every evening. , Disp: , Rfl:  .  NON FORMULARY, Take 2 tablets by mouth daily. Blood Sugar Defense , Disp: , Rfl:  .  ondansetron (ZOFRAN) 8 MG tablet, Take 1 tablet (8 mg total) by mouth 2 (two) times daily as needed for refractory nausea / vomiting. Start on day 3 after cyclophosphamide chemotherapy., Disp: 30 tablet, Rfl: 1 .  potassium chloride (K-DUR) 10 MEQ tablet, Take 10 mEq by mouth daily. , Disp: , Rfl:  .  predniSONE (DELTASONE) 20 MG tablet, TAKE 3 TABLETS BY MOUTH DAILY. TAKE ON DAYS 1 THROUGH 5 OF CHEMOTHERAPY, Disp: 15 tablet, Rfl: 6 .  tamsulosin (FLOMAX) 0.4 MG CAPS capsule, Take 0.4 mg by mouth daily., Disp: , Rfl:  .  prochlorperazine (COMPAZINE) 10 MG tablet, Take 1 tablet (10 mg total) by  mouth every 6 (six) hours as needed for up to 5 days (Nausea or vomiting)., Disp: 20 tablet, Rfl: 0  Allergies:  No Known Allergies  Past Medical History, Surgical history, Social history, and Family History were reviewed and updated.  Review of Systems: Review of Systems  Constitutional: Negative.   HENT:  Negative.   Eyes: Negative.   Respiratory: Negative.   Cardiovascular: Negative.   Gastrointestinal: Negative.   Endocrine: Negative.   Genitourinary: Positive for nocturia.   Musculoskeletal: Positive for arthralgias, gait problem and myalgias.  Neurological: Positive for gait problem.  Hematological: Negative.   Psychiatric/Behavioral: Negative.     Physical Exam:  height is 6\' 2"  (1.88 m) and weight is 273 lb (123.8 kg). His temporal temperature is 97.9 F (36.6 C). His blood pressure is 174/92 (abnormal) and his pulse is 88. His respiration is 18 and oxygen saturation is 100%.   Wt Readings from Last 3 Encounters:  06/12/19 273 lb (123.8 kg)  04/25/19 284 lb 1.9 oz (128.9 kg)  03/28/19 290 lb (131.5 kg)    Physical Exam Vitals reviewed.  HENT:     Head: Normocephalic and atraumatic.  Eyes:     Pupils: Pupils are equal, round, and reactive to light.  Neck:     Comments: Examination of his neck shows marked improvement in the adenopathy in the left neck.  He still has some palpable posterior cervical lymphadenopathy.  It probably measures about 2 x 2 cm.  It is not as firm.  It is somewhat mobile.. Cardiovascular:     Rate and Rhythm: Normal rate and regular rhythm.     Heart sounds: Normal heart sounds.  Pulmonary:     Effort: Pulmonary effort is normal.     Breath sounds: Normal breath sounds.  Abdominal:     General: Bowel sounds are normal.     Palpations: Abdomen is soft.  Musculoskeletal:        General: No tenderness or deformity. Normal range of motion.     Comments: He has a cast on the right foot and lower leg.  This goes about two thirds the way up  his right leg.  Left leg is unremarkable.  He has no swelling.  He has decent pulses.  He has good range of motion of his joints.  Lymphadenopathy:     Cervical: No cervical adenopathy.  Skin:    General: Skin is warm and dry.     Findings: No erythema or rash.  Neurological:     Mental Status: He  is alert and oriented to person, place, and time.  Psychiatric:        Behavior: Behavior normal.        Thought Content: Thought content normal.        Judgment: Judgment normal.      Lab Results  Component Value Date   WBC 3.4 (L) 06/12/2019   HGB 13.5 06/12/2019   HCT 38.5 (L) 06/12/2019   MCV 87.5 06/12/2019   PLT 169 06/12/2019     Chemistry      Component Value Date/Time   NA 139 06/12/2019 0854   NA 142 01/25/2017 1217   K 3.3 (L) 06/12/2019 0854   K 4.1 01/25/2017 1217   CL 103 06/12/2019 0854   CL 102 01/25/2017 1217   CO2 27 06/12/2019 0854   CO2 33 01/25/2017 1217   BUN 11 06/12/2019 0854   BUN 12 01/25/2017 1217   CREATININE 1.16 06/12/2019 0854   CREATININE 1.4 (H) 01/25/2017 1217      Component Value Date/Time   CALCIUM 9.7 06/12/2019 0854   CALCIUM 9.7 01/25/2017 1217   ALKPHOS 61 06/12/2019 0854   ALKPHOS 59 01/25/2017 1217   AST 20 06/12/2019 0854   ALT 23 06/12/2019 0854   ALT 32 01/25/2017 1217   BILITOT 0.5 06/12/2019 0854       Impression and Plan: Mr. Freier is a 74 year old African-American male.  He was diagnosed with CLL back in [place].  For right now, I really think that he will need consolidative radiation therapy to the left neck.  I think this will be incredibly helpful.  I talked to him a little bit about radiation.  I gave him my overview of radiation, his duration, and side effects.  Again I told him that I thought his biggest problem in the long run will be his diabetes.  He really has to get this under better control or he will have permanent problems.  I spent about 45 minutes with him today.  All time spent face-to-face.   I went over his lab work.  Went over his PET scan.  I had to explain radiation.  I had to answer all of his questions.  It was quite complicated.   Volanda Napoleon, MD 1/19/20219:57 AM

## 2019-06-14 ENCOUNTER — Telehealth: Payer: Self-pay | Admitting: *Deleted

## 2019-06-14 NOTE — Telephone Encounter (Signed)
Message received from patient requesting a call back regarding prescription.  Call placed back to pt and patient states that Dr. Marin Olp told him that a prescription for Vitamin B-6 would be sent in for him.  Informed pt that he can obtain Vitamin B-6 over the counter.  Pt appreciative of call back and states that he will obtain OTC Vit B6.

## 2019-06-19 DIAGNOSIS — G959 Disease of spinal cord, unspecified: Secondary | ICD-10-CM | POA: Diagnosis not present

## 2019-06-19 DIAGNOSIS — C9190 Lymphoid leukemia, unspecified not having achieved remission: Secondary | ICD-10-CM | POA: Diagnosis not present

## 2019-06-19 DIAGNOSIS — E785 Hyperlipidemia, unspecified: Secondary | ICD-10-CM | POA: Diagnosis not present

## 2019-06-19 DIAGNOSIS — I10 Essential (primary) hypertension: Secondary | ICD-10-CM | POA: Diagnosis not present

## 2019-06-19 DIAGNOSIS — E119 Type 2 diabetes mellitus without complications: Secondary | ICD-10-CM | POA: Diagnosis not present

## 2019-06-19 DIAGNOSIS — Z0000 Encounter for general adult medical examination without abnormal findings: Secondary | ICD-10-CM | POA: Diagnosis not present

## 2019-06-19 NOTE — Progress Notes (Signed)
Histology and Location of Primary Cancer: Principle Diagnosis:  Diffuse large cell non-Hodgkin's lymphoma-Richter's transformation from CLL  Location(s) of Symptomatic tumor(s):The PET scan seem to show some low-level activity in the left neck.     Past/Anticipated chemotherapy by medical oncology, if any: Per Dr. Marin Olp 06/12/19: Impression and Plan: Ronald Rice is a 74 year old African-American male.  He was diagnosed with CLL back in [place].  For right now, I really think that he will need consolidative radiation therapy to the left neck.  I think this will be incredibly helpful.  I talked to him a little bit about radiation.  I gave him my overview of radiation, his duration, and side effects.  Again I told him that I thought his biggest problem in the long run will be his diabetes.  He really has to get this under better control or he will have permanent problems.  I spent about 45 minutes with him today.  All time spent face-to-face.  I went over his lab work.  Went over his PET scan.  I had to explain radiation.  I had to answer all of his questions.  It was quite complicated.  Pain on a scale of 0-10 is: pt denies c/o pain.   Ambulatory status? Walker? Wheelchair?: Pt ambulates with a rollator.   SAFETY ISSUES:  Prior radiation? No  Pacemaker/ICD? No  Possible current pregnancy? N/A  Is the patient on methotrexate? No  Additional Complaints / other details:  Pt presents today for initial consult with Dr. Sondra Come for Radiation Oncology. Accompanied by wife.  BP (!) 135/91 (BP Location: Left Arm, Patient Position: Sitting)   Pulse 79   Temp 98.2 F (36.8 C) (Temporal)   Resp 18   Ht 6\' 2"  (1.88 m)   Wt 283 lb 8 oz (128.6 kg)   SpO2 99%   BMI 36.40 kg/m   Wt Readings from Last 3 Encounters:  06/20/19 283 lb 8 oz (128.6 kg)  06/12/19 273 lb (123.8 kg)  04/25/19 284 lb 1.9 oz (128.9 kg)   Ronald Sousa, RN BSN

## 2019-06-20 ENCOUNTER — Ambulatory Visit
Admission: RE | Admit: 2019-06-20 | Discharge: 2019-06-20 | Disposition: A | Discharge status: Home / Self Care | Payer: Medicare HMO | Source: Ambulatory Visit | Attending: Radiation Oncology | Admitting: Radiation Oncology

## 2019-06-20 ENCOUNTER — Other Ambulatory Visit: Payer: Self-pay

## 2019-06-20 ENCOUNTER — Encounter: Payer: Self-pay | Admitting: Radiation Oncology

## 2019-06-20 VITALS — BP 135/91 | HR 79 | Temp 98.2°F | Resp 18 | Ht 74.0 in | Wt 283.5 lb

## 2019-06-20 DIAGNOSIS — I10 Essential (primary) hypertension: Secondary | ICD-10-CM | POA: Diagnosis not present

## 2019-06-20 DIAGNOSIS — K219 Gastro-esophageal reflux disease without esophagitis: Secondary | ICD-10-CM | POA: Insufficient documentation

## 2019-06-20 DIAGNOSIS — C8338 Diffuse large B-cell lymphoma, lymph nodes of multiple sites: Secondary | ICD-10-CM

## 2019-06-20 DIAGNOSIS — Z7982 Long term (current) use of aspirin: Secondary | ICD-10-CM | POA: Diagnosis not present

## 2019-06-20 DIAGNOSIS — Z79899 Other long term (current) drug therapy: Secondary | ICD-10-CM | POA: Diagnosis not present

## 2019-06-20 DIAGNOSIS — Z87891 Personal history of nicotine dependence: Secondary | ICD-10-CM | POA: Diagnosis not present

## 2019-06-20 DIAGNOSIS — K449 Diaphragmatic hernia without obstruction or gangrene: Secondary | ICD-10-CM | POA: Diagnosis not present

## 2019-06-20 DIAGNOSIS — E119 Type 2 diabetes mellitus without complications: Secondary | ICD-10-CM | POA: Diagnosis not present

## 2019-06-20 DIAGNOSIS — G4730 Sleep apnea, unspecified: Secondary | ICD-10-CM | POA: Insufficient documentation

## 2019-06-20 DIAGNOSIS — Z7984 Long term (current) use of oral hypoglycemic drugs: Secondary | ICD-10-CM | POA: Diagnosis not present

## 2019-06-20 DIAGNOSIS — Z856 Personal history of leukemia: Secondary | ICD-10-CM | POA: Diagnosis not present

## 2019-06-20 DIAGNOSIS — I2510 Atherosclerotic heart disease of native coronary artery without angina pectoris: Secondary | ICD-10-CM | POA: Insufficient documentation

## 2019-06-20 DIAGNOSIS — C8331 Diffuse large B-cell lymphoma, lymph nodes of head, face, and neck: Secondary | ICD-10-CM | POA: Insufficient documentation

## 2019-06-20 NOTE — Patient Instructions (Signed)
Coronavirus (COVID-19) Are you at risk?  Are you at risk for the Coronavirus (COVID-19)?  To be considered HIGH RISK for Coronavirus (COVID-19), you have to meet the following criteria:  . Traveled to [place], [place], [place], [place] or [place]; or in the [place] to [place], [place], [place], or [place]; and have fever, cough, and shortness of breath within the last 2 weeks of travel OR . Been in close contact with a person diagnosed with COVID-19 within the last 2 weeks and have fever, cough, and shortness of breath . IF YOU DO NOT MEET THESE CRITERIA, YOU ARE CONSIDERED LOW RISK FOR COVID-19.  What to do if you are HIGH RISK for COVID-19?  . If you are having a medical emergency, call 911. . Seek medical care right away. Before you go to a doctor's office, urgent care or emergency department, call ahead and tell them about your recent travel, contact with someone diagnosed with COVID-19, and your symptoms. You should receive instructions from your physician's office regarding next steps of care.  . When you arrive at healthcare provider, tell the healthcare staff immediately you have returned from visiting [place], [place], [place], [place] or [place]; or traveled in the [place] to [place], [place], [place], or [place]; in the last two weeks or you have been in close contact with a person diagnosed with COVID-19 in the last 2 weeks.   . Tell the health care staff about your symptoms: fever, cough and shortness of breath. . After you have been seen by a medical provider, you will be either: o Tested for (COVID-19) and discharged home on quarantine except to seek medical care if symptoms worsen, and asked to  - Stay home and avoid contact with others until you get your results (4-5 days)  - Avoid travel on public transportation if possible (such as bus, train, or airplane) or o Sent to the Emergency Department by EMS for evaluation, COVID-19 testing, and possible  admission depending on your condition and test results.  What to do if you are LOW RISK for COVID-19?  Reduce your risk of any infection by using the same precautions used for avoiding the common cold or flu:  . Wash your hands often with soap and warm water for at least 20 seconds.  If soap and water are not readily available, use an alcohol-based hand sanitizer with at least 60% alcohol.  . If coughing or sneezing, cover your mouth and nose by coughing or sneezing into the elbow areas of your shirt or coat, into a tissue or into your sleeve (not your hands). . Avoid shaking hands with others and consider head nods or verbal greetings only. . Avoid touching your eyes, nose, or mouth with unwashed hands.  . Avoid close contact with people who are sick. . Avoid places or events with large numbers of people in one location, like concerts or sporting events. . Carefully consider travel plans you have or are making. . If you are planning any travel outside or inside the [place], visit the CDC's Travelers' Health webpage for the latest health notices. . If you have some symptoms but not all symptoms, continue to monitor at home and seek medical attention if your symptoms worsen. . If you are having a medical emergency, call 911.   ADDITIONAL HEALTHCARE OPTIONS FOR PATIENTS  [facility] / e-Visit: [email or web address]         [facility]: [phone]     Urgent Care: [phone]                   [facility]: [phone]   

## 2019-06-20 NOTE — Progress Notes (Addendum)
Radiation Oncology         ([phone]) [phone] ________________________________  Initial outpatient Consultation  Name: Ronald Rice MRN: [id number]  Date: 06/20/2019  DOB: 12-20-1945  [id number], Ronald Jew, MD  Ronald Napoleon, MD   REFERRING PHYSICIAN: Volanda Napoleon, MD  DIAGNOSIS: The encounter diagnosis was Diffuse large B-cell lymphoma of lymph nodes of multiple regions [facility]).  Diffuse large cell non-Hodgkin's lymphoma-Richter's transformation from [name] is a 74 y.o. male who is accompanied by wife. The patient presented to the ED on 10/27/2018 with complaints of weakness and firm, tender left-sided neck mass. CT of soft tissue neck done on 10/27/2018 showed severe left-sided cervical and supraclavicular lymphadenopathy, including necrotic left level 2A node that measured 4.6 x 5.5 cm. There were also numerous right-sided cervical lymph nodes, which were predominantly sub-centimeter. Findings were most suggestive of lymphoma.  Patient also had a CT of chest/abdomen/pelvis done on 10/27/2018, which showed very bulky left cervical and supraclavicular lymphadenopathy, prominent bilateral axillary and mediastinal lymph nodes, and numerous prominent gastrohepatic, retroperitoneal, and mesenterial lymph nodes. It also showed an unchanged 6 mm pulmonary nodule of the left lung base and gross deformity of the left kidney secondary to extremely bulky exophytic cysts.  The patient underwent a biopsy on 10/27/2018 that showed high-grade B-cell lymphoma of the lymph node in the left neck.  Initial PET scan on 11/07/2018 showed bulky left cervical, left supraclavicular, and left thoracic inlet lymphadenopathy that was markedly hypermetabolic and consistent with Deauville 4-5 activity. It also showed an increased number of small lymph nodes in the right supraclavicular region, bilateral axillary regions, subpectoral regions, and mediastinum with Deauville 3 activity.  Finally, it showed mildly enlarged lymph nodes in the retroperitoneal space along both pelvis sidewalls showing FDG accumulation compatible with Deauville 3 activity and multiple large cystic legions in the left kidney that were distorting the renal anatomy. No splenic hypermetabolism.  Patient began R-CHOP chemotherapy on 11/08/2018 under the care of Dr. Marin Rice.  CTA of chest on 12/20/2018 showed an overall decrease in the left supraclavicular and mediastinal adenopathy.  PET scan on 01/02/2019 showed interval response to chemotherapy. The bulky left cervical, left supraclavicular, and left thoracic inlet lymphadenopathy had decreased in size and hypermetabolism. Uptake in those regions at that time was compatible with Deauville 3 disease. There was also an interval decrease in the bilateral subpectoral adenopathy, down to Deauville 2. Finally, the mediastinal, retroperitoneal, and pelvic sidewall lymph nodes measured smaller with FDG uptake compatible with Deauville 2 and 3 category disease. No new or progressive findings on that study.  The patient had an ultrasound of his thyroid on 03/05/2019 for follow-up of 1.4 cm left inferior thyroid nodule seen one year prior. Ultrasound at that time showed no further visualization of the 1.4 cm nodule in the inferior left lobe. Other scattered nodules and cysts appeared stable and did not meet criteria for biopsy or dedicated follow-up. There was also a decreased in the size of the left-sided cervical lymph nodes since biopsy in June of 2020.  Most recent PET scan on 05/30/2019 showed mixed interval response to chemotherapy. Hypermetabolic left neck lymph nodes demonstrated increased metabolism, despite decreasing in size. No residual hypermetabolic lymph nodes in the chest, abdomen, or pelvis. It did show some new mild patchy consolidation in the dependent basilar right lower lobe with mild hypermetabolism, probably infectious/inflammatory.  The patient was  last seen by Dr. Marin Rice on 06/12/2019. He is status post cycle #8 of R-CHOP  chemotherapy.  Of note, the patient has a history of CLL and Non-Hodkin's Lymphoma that was treated in [place] roughly four years ago. The patient does not remember the exact course of treatment but did recall taking Rituxan.   Patient is now seen in radiation oncology to be considered for involved field radiation therapy given the residual disease in the left neck, the site where the patient initially presented with bulky disease.  PREVIOUS RADIATION THERAPY: No  PAST MEDICAL HISTORY:  Past Medical History:  Diagnosis Date  . Cancer ([place])    Leukemia   . CLL (chronic lymphocytic leukemia) ([place])    see records in care everywhere from [place]  . Coronary artery disease    minimal nonobstructive by 10/31/16 cath at [facility] in [place]  . Diabetes mellitus without complication ([place])   . Diffuse large B-cell lymphoma of lymph nodes of multiple regions ([place]) 10/31/2018  . Fracture    left ankle fracture with retained hardware  . GERD (gastroesophageal reflux disease)   . Goals of care, counseling/discussion 10/31/2018  . Hypertension   . Sleep apnea    does not wear CPAP  . Wears dentures   . Wears glasses     PAST SURGICAL HISTORY: Past Surgical History:  Procedure Laterality Date  . ANKLE RECONSTRUCTION Left 12/19/2018   Procedure: LEFT ANKLE REVISION FIXATION, REMOVAL OF HARDWARE, OPEN TREATMENT, SYNDESMOSIS, POSSIBLE ANKLE ARTHROTOMY, DELTOID LIGAMENT REPAIR;  Surgeon: Erle Crocker, MD;  Location: [place];  Service: Orthopedics;  Laterality: Left;  . ANTERIOR CERVICAL DECOMP/DISCECTOMY FUSION N/A 04/20/2017   Procedure: ANTERIOR CERVICAL DECOMPRESSION FUSION, CERVICAL 3-4, CERVICAL 4-5 WITH INSTRUMENTATION AND ALLOGRAFT; REQUEST 3.5 HOURS;  Surgeon: Phylliss Bob, MD;  Location: [place];  Service: Orthopedics;  Laterality: N/A;  ANTERIOR CERVICAL DECOMPRESSION FUSION, CERVICAL 3-4, CERVICAL 4-5 WITH  INSTRUMENTATION AND ALLOGRAFT; REQUEST 3.5 HOURS  . BACK SURGERY    . CARDIAC CATHETERIZATION     in Care Everywhere 11/01/16  . COLONOSCOPY    . IR IMAGING GUIDED PORT INSERTION  11/10/2018  . MULTIPLE TOOTH EXTRACTIONS    . ORIF ANKLE FRACTURE Left 09/13/2018   Procedure: OPEN REDUCTION INTERNAL FIXATION (ORIF) LATERAL LEFT ANKLE FRACTURE;  Surgeon: Dorna Leitz, MD;  Location: [place];  Service: Orthopedics;  Laterality: Left;  . SYNDESMOSIS REPAIR Left 09/13/2018   Procedure: OPEN REDUCTION INTERNAL FIXATION SYNDESMOSIS REPAIR LEFT ANKLE;  Surgeon: Dorna Leitz, MD;  Location: [place];  Service: Orthopedics;  Laterality: Left;    FAMILY HISTORY:  Family History  Problem Relation Age of Onset  . Heart attack Mother   . Diabetes Sister   . Diabetes Brother     SOCIAL HISTORY:  Social History   Tobacco Use  . Smoking status: Former Smoker    Types: Pipe, Cigars, Cigarettes    Quit date: 10/31/1989    Years since quitting: 29.6  . Smokeless tobacco: Never Used  Substance Use Topics  . Alcohol use: Yes    Comment: occasional  . Drug use: No    ALLERGIES: No Known Allergies  MEDICATIONS:  Current Outpatient Medications  Medication Sig Dispense Refill  . acetaminophen (TYLENOL) 500 MG tablet Take 1,000 mg by mouth every 6 (six) hours as needed (pain).     Marland Kitchen allopurinol (ZYLOPRIM) 300 MG tablet Take 300 mg by mouth daily.    Marland Kitchen amoxicillin-clavulanate (AUGMENTIN) 875-125 MG tablet Take 1 tablet by mouth 2 (two) times daily. 30 tablet 1  . aspirin EC 81 MG tablet Take 81 mg by  mouth daily.    Marland Kitchen atenolol (TENORMIN) 25 MG tablet Take 25 mg by mouth daily.     Marland Kitchen atorvastatin (LIPITOR) 40 MG tablet Take 40 mg by mouth daily.    Marland Kitchen CINNAMON PO Take 1 tablet by mouth daily.    . diclofenac sodium (VOLTAREN) 1 % GEL Apply 1 application topically as needed (pain).     Marland Kitchen diphenhydrAMINE (SIMPLY SLEEP) 25 MG tablet Take 25 mg by mouth at bedtime as needed for sleep.    Marland Kitchen donepezil (ARICEPT  ODT) 5 MG disintegrating tablet Take 5 mg by mouth at bedtime.    . DULoxetine (CYMBALTA) 30 MG capsule Take 1 capsule (30 mg total) by mouth daily. 30 capsule 3  . famotidine (PEPCID) 20 MG tablet Take 20 mg by mouth at bedtime as needed for heartburn.     . gabapentin (NEURONTIN) 300 MG capsule 300 mg.    . hydrochlorothiazide (HYDRODIURIL) 25 MG tablet Take 25 mg by mouth daily.     Marland Kitchen lidocaine-prilocaine (EMLA) cream Apply to PAC 1 hour prior to procedure. 30 g 3  . lisinopril (ZESTRIL) 40 MG tablet Take 1 tablet (40 mg total) by mouth daily for 30 days. 30 tablet 0  . loratadine (CLARITIN) 10 MG tablet Take 10 mg by mouth daily.    . metFORMIN (GLUCOPHAGE-XR) 500 MG 24 hr tablet Take 500 mg by mouth every evening.     . NON FORMULARY Take 2 tablets by mouth daily. Blood Sugar Defense     . ondansetron (ZOFRAN) 8 MG tablet Take 1 tablet (8 mg total) by mouth 2 (two) times daily as needed for refractory nausea / vomiting. Start on day 3 after cyclophosphamide chemotherapy. 30 tablet 1  . potassium chloride (K-DUR) 10 MEQ tablet Take 10 mEq by mouth daily.     . predniSONE (DELTASONE) 20 MG tablet TAKE 3 TABLETS BY MOUTH DAILY. TAKE ON DAYS 1 THROUGH 5 OF CHEMOTHERAPY 15 tablet 6  . Pyridoxine HCl (VITAMIN B-6) 250 MG tablet Take 1 tablet (250 mg total) by mouth daily. 30 tablet 6  . tamsulosin (FLOMAX) 0.4 MG CAPS capsule Take 0.4 mg by mouth daily.    . prochlorperazine (COMPAZINE) 10 MG tablet Take 1 tablet (10 mg total) by mouth every 6 (six) hours as needed for up to 5 days (Nausea or vomiting). 20 tablet 0   No current facility-administered medications for this encounter.    REVIEW OF SYSTEMS:  A 10+ POINT REVIEW OF SYSTEMS WAS OBTAINED including neurology, dermatology, psychiatry, cardiac, respiratory, lymph, extremities, GI, GU, musculoskeletal, constitutional, reproductive, HEENT.  Denies any pain within the left neck area, voice changes or swallowing difficulties   PHYSICAL EXAM:   height is 6\' 2"  (1.88 m) and weight is 283 lb 8 oz (128.6 kg). His temporal temperature is 98.2 F (36.8 C). His blood pressure is 135/91 (abnormal) and his pulse is 79. His respiration is 18 and oxygen saturation is 99%.   General: Alert and oriented, in no acute distress HEENT: Head is normocephalic. Extraocular movements are intact. Oropharynx is clear infection.  Patient has dentures in place along the maxillary region.  He has gold crowns along the right molar, mandibular region.  Patient has several teeth missing along the left posterior mandible region Neck: Neck is supple, solitary node palpable in the left mid neck proximately 1-1/2 cm Heart: Regular in rate and rhythm with no murmurs, rubs, or gallops. Chest: Clear to auscultation bilaterally, with no rhonchi, wheezes, or rales.  Abdomen: Soft, nontender, nondistended, with no rigidity or guarding. Extremities: No cyanosis or edema. Lymphatics: see Neck Exam Skin: No concerning lesions. Musculoskeletal: symmetric strength and muscle tone throughout. Neurologic: Cranial nerves II through XII are grossly intact. No obvious focalities. Speech is fluent. Coordination is intact.  Does have some weakness with dorsiflexion of his left foot Psychiatric: Judgment and insight are intact. Affect is appropriate. Patient reports generalized leg weakness related to his multiple back surgeries.  He ambulates with the assistance of a walker  ECOG = 1  0 - Asymptomatic (Fully active, able to carry on all predisease activities without restriction)  1 - Symptomatic but completely ambulatory (Restricted in physically strenuous activity but ambulatory and able to carry out work of a light or sedentary nature. For example, light housework, office work)  2 - Symptomatic, <50% in bed during the day (Ambulatory and capable of all self care but unable to carry out any work activities. Up and about more than 50% of waking hours)  3 - Symptomatic, >50% in bed,  but not bedbound (Capable of only limited self-care, confined to bed or chair 50% or more of waking hours)  4 - Bedbound (Completely disabled. Cannot carry on any self-care. Totally confined to bed or chair)  5 - Death   Eustace Pen MM, Creech RH, Tormey DC, et al. [phone]). "Toxicity and response criteria of the [facility]". [place] Oncol. 5 (6): 649-55  LABORATORY DATA:  Lab Results  Component Value Date   WBC 3.4 (L) 06/12/2019   HGB 13.5 06/12/2019   HCT 38.5 (L) 06/12/2019   MCV 87.5 06/12/2019   PLT 169 06/12/2019   NEUTROABS 1.5 (L) 06/12/2019   Lab Results  Component Value Date   NA 139 06/12/2019   K 3.3 (L) 06/12/2019   CL 103 06/12/2019   CO2 27 06/12/2019   GLUCOSE 258 (H) 06/12/2019   CREATININE 1.16 06/12/2019   CALCIUM 9.7 06/12/2019      RADIOGRAPHY: NM PET Image Restag (PS) Skull Base To Thigh  Result Date: 05/30/2019 CLINICAL DATA:  Subsequent treatment strategy for diffuse large B-cell lymphoma status post 8 cycles chemotherapy. EXAM: NUCLEAR MEDICINE PET SKULL BASE TO THIGH TECHNIQUE: 13.9 mCi F-18 FDG was injected intravenously. Full-ring PET imaging was performed from the skull base to thigh after the radiotracer. CT data was obtained and used for attenuation correction and anatomic localization. Fasting blood glucose: 149 mg/dl COMPARISON:  01/02/2019 PET-CT. FINDINGS: Mediastinal blood pool activity: SUV max 4.1 Liver activity: SUV max 4.6 NECK: Hypermetabolic enlarged levels II, III and V left neck lymph nodes demonstrate a mixed response. Representative 1.2 cm left level V neck lymph node with max SUV 5.4 (series 4/image 24), previously 1.4 cm with max SUV 4.0, mildly decreased in size and mildly increased in metabolism. Representative 1.5 cm left level II neck node with max SUV 4.8 (series 4/image 23), previously 1.7 cm with max SUV 3.5, mildly decreased in size and mildly increased in metabolism. Representative 1.3 cm left level III neck  node with max SUV 6.6 (series 4/image 36), previously 0.9 cm with max SUV 3.1, increased in size and metabolism. No enlarged or hypermetabolic right neck lymph nodes. Incidental CT findings: Right internal jugular Port-A-Cath terminates in the lower third of the SVC. CHEST: No residual hypermetabolic axillary, retropectoral, mediastinal or hilar lymph nodes. Mildly enlarged 1.2 cm right paratracheal lymph node with max SUV 2.5 (series 4/image 69), previously 1.3 cm with max SUV 3.3, mildly decreased  in size and metabolism. New mild patchy consolidation in the dependent basilar right lower lobe with mild hypermetabolism with max SUV 3.8 (series 8/image 74). Two small scattered solid left pulmonary nodules, largest 5 mm in the peripheral left lower lobe (series 8/image 51), below PET resolution, presumably benign. No lung masses or new significant pulmonary nodules. Incidental CT findings: Coronary atherosclerosis. ABDOMEN/PELVIS: No abnormal hypermetabolic activity within the liver, pancreas, adrenal glands, or spleen. No hypermetabolic lymph nodes in the abdomen or pelvis. Incidental CT findings: Small hiatal hernia. Numerous small simple cysts scattered throughout the liver, largest 1.6 cm in the left lower lobe. Stable prominent enlargement of the left kidney by numerous large left renal cysts, the largest of which measures 13.8 cm in the posterior upper left kidney with thick mural calcification, unchanged. Several right renal cysts are unchanged measuring up to 5.7 cm in the lateral upper right kidney. No hydronephrosis. Atherosclerotic nonaneurysmal abdominal aorta. SKELETON: No focal hypermetabolic activity to suggest skeletal metastasis. Incidental CT findings: Stable bilateral posterior spinal fusion hardware in the lumbosacral spine. Surgical hardware from ACDF in the cervical spine. IMPRESSION: 1. Mixed interval response. Hypermetabolic left neck lymph nodes demonstrate increased metabolism, despite  decreasing in size, with Deauville score 4. No residual hypermetabolic lymph nodes in the chest, abdomen or pelvis. 2. New mild patchy consolidation in the dependent basilar right lower lobe with mild hypermetabolism, probably infectious/inflammatory, recommend attention on follow-up imaging. 3. Aortic Atherosclerosis (ICD10-I70.0). Additional chronic findings as detailed. Electronically Signed   By: Ilona Sorrel M.D.   On: 05/30/2019 14:21      IMPRESSION: Diffuse large cell non-Hodgkin's lymphoma-Richter's transformation from CLL  The patient has had an excellent response from his chemotherapy for his diffuse large cell non-Hodgkin's lymphoma.  Most recent PET scan however shows residual activity in the left neck, the site of initial bulk presentation.  Patient will be a good candidate for involved field radiation therapy with the initial field directed at the site of initial bulk disease in the left neck and left supraclavicular region.  We will initially treat with a larger field followed by boost to the palpable adenopathy in the left neck.  Anticipate approximately 4-1/2 to 5 weeks of radiation therapy  Today, I talked to the patient and his wife about the findings and work-up thus far.  We discussed the natural history of diffuse large cell non-Hodgkin's lymphoma and general treatment, highlighting the role of radiotherapy in the management.  We discussed the available radiation techniques, and focused on the details of logistics and delivery.  We reviewed the anticipated acute and late sequelae associated with radiation in this setting.  The patient was encouraged to ask questions that I answered to the best of my ability.  A patient consent form was discussed and signed.  We retained a copy for our records.  The patient would like to proceed with radiation and will be scheduled for CT simulation.  PLAN: The patient is scheduled for CT simulation later today.  Treatments to begin next week.   Anticipate between 4 and 5 weeks of radiation therapy directed at the left neck region    ------------------------------------------------  Blair Promise, PhD, MD  This document serves as a record of services personally performed by Gery Pray, MD. It was created on his behalf by Clerance Lav, a trained medical scribe. The creation of this record is based on the scribe's personal observations and the provider's statements to them. This document has been checked and approved by  the attending provider.

## 2019-06-25 DIAGNOSIS — Z510 Encounter for antineoplastic radiation therapy: Secondary | ICD-10-CM | POA: Diagnosis not present

## 2019-06-25 DIAGNOSIS — C8331 Diffuse large B-cell lymphoma, lymph nodes of head, face, and neck: Secondary | ICD-10-CM | POA: Insufficient documentation

## 2019-06-25 DIAGNOSIS — C8338 Diffuse large B-cell lymphoma, lymph nodes of multiple sites: Secondary | ICD-10-CM | POA: Diagnosis not present

## 2019-06-26 NOTE — Progress Notes (Signed)
  Radiation Oncology         [phone]) [phone] ________________________________  Name: Gildardo Lardner MRN: [id number]  Date: 06/27/2019  DOB: February 27, 1946  Simulation Verification Note  NARRATIVE: The patient was brought to the treatment unit and placed in the planned treatment position. The clinical setup was verified. Then port films were obtained and uploaded to the radiation oncology medical record software.  The treatment beams were carefully compared against the planned radiation fields. The position location and shape of the radiation fields was reviewed. They targeted volume of tissue appears to be appropriately covered by the radiation beams. Organs at risk appear to be excluded as planned.  Based on my personal review, I approved the simulation verification. The patient's treatment will proceed as planned.  -----------------------------------  Blair Promise, PhD, MD  This document serves as a record of services personally performed by Gery Pray, MD. It was created on his behalf by Clerance Lav, a trained medical scribe. The creation of this record is based on the scribe's personal observations and the provider's statements to them. This document has been checked and approved by the attending provider.

## 2019-06-27 ENCOUNTER — Ambulatory Visit
Admission: RE | Admit: 2019-06-27 | Discharge: 2019-06-27 | Disposition: A | Discharge status: Home / Self Care | Payer: Medicare HMO | Source: Ambulatory Visit | Attending: Radiation Oncology | Admitting: Radiation Oncology

## 2019-06-27 ENCOUNTER — Other Ambulatory Visit: Payer: Self-pay

## 2019-06-27 DIAGNOSIS — C8338 Diffuse large B-cell lymphoma, lymph nodes of multiple sites: Secondary | ICD-10-CM | POA: Diagnosis not present

## 2019-06-27 DIAGNOSIS — Z510 Encounter for antineoplastic radiation therapy: Secondary | ICD-10-CM | POA: Diagnosis not present

## 2019-06-27 DIAGNOSIS — C8331 Diffuse large B-cell lymphoma, lymph nodes of head, face, and neck: Secondary | ICD-10-CM | POA: Diagnosis not present

## 2019-06-28 ENCOUNTER — Other Ambulatory Visit: Payer: Self-pay

## 2019-06-28 ENCOUNTER — Ambulatory Visit
Admission: RE | Admit: 2019-06-28 | Discharge: 2019-06-28 | Disposition: A | Discharge status: Home / Self Care | Payer: Medicare HMO | Source: Ambulatory Visit | Attending: Radiation Oncology | Admitting: Radiation Oncology

## 2019-06-28 DIAGNOSIS — C8331 Diffuse large B-cell lymphoma, lymph nodes of head, face, and neck: Secondary | ICD-10-CM | POA: Diagnosis not present

## 2019-06-28 DIAGNOSIS — C8338 Diffuse large B-cell lymphoma, lymph nodes of multiple sites: Secondary | ICD-10-CM | POA: Diagnosis not present

## 2019-06-28 DIAGNOSIS — Z510 Encounter for antineoplastic radiation therapy: Secondary | ICD-10-CM | POA: Diagnosis not present

## 2019-06-29 ENCOUNTER — Ambulatory Visit
Admission: RE | Admit: 2019-06-29 | Discharge: 2019-06-29 | Disposition: A | Discharge status: Home / Self Care | Payer: Medicare HMO | Source: Ambulatory Visit | Attending: Radiation Oncology | Admitting: Radiation Oncology

## 2019-06-29 ENCOUNTER — Other Ambulatory Visit: Payer: Self-pay

## 2019-06-29 DIAGNOSIS — Z510 Encounter for antineoplastic radiation therapy: Secondary | ICD-10-CM | POA: Diagnosis not present

## 2019-06-29 DIAGNOSIS — C8338 Diffuse large B-cell lymphoma, lymph nodes of multiple sites: Secondary | ICD-10-CM | POA: Diagnosis not present

## 2019-06-29 DIAGNOSIS — C8331 Diffuse large B-cell lymphoma, lymph nodes of head, face, and neck: Secondary | ICD-10-CM | POA: Diagnosis not present

## 2019-07-02 ENCOUNTER — Other Ambulatory Visit: Payer: Self-pay

## 2019-07-02 ENCOUNTER — Ambulatory Visit
Admission: RE | Admit: 2019-07-02 | Discharge: 2019-07-02 | Disposition: A | Discharge status: Home / Self Care | Payer: Medicare HMO | Source: Ambulatory Visit | Attending: Radiation Oncology | Admitting: Radiation Oncology

## 2019-07-02 DIAGNOSIS — Z510 Encounter for antineoplastic radiation therapy: Secondary | ICD-10-CM | POA: Diagnosis not present

## 2019-07-02 DIAGNOSIS — C8338 Diffuse large B-cell lymphoma, lymph nodes of multiple sites: Secondary | ICD-10-CM | POA: Diagnosis not present

## 2019-07-02 DIAGNOSIS — C8331 Diffuse large B-cell lymphoma, lymph nodes of head, face, and neck: Secondary | ICD-10-CM | POA: Diagnosis not present

## 2019-07-03 ENCOUNTER — Other Ambulatory Visit: Payer: Self-pay

## 2019-07-03 ENCOUNTER — Ambulatory Visit
Admission: RE | Admit: 2019-07-03 | Discharge: 2019-07-03 | Disposition: A | Discharge status: Home / Self Care | Payer: Medicare HMO | Source: Ambulatory Visit | Attending: Radiation Oncology | Admitting: Radiation Oncology

## 2019-07-03 DIAGNOSIS — C8331 Diffuse large B-cell lymphoma, lymph nodes of head, face, and neck: Secondary | ICD-10-CM | POA: Diagnosis not present

## 2019-07-03 DIAGNOSIS — Z510 Encounter for antineoplastic radiation therapy: Secondary | ICD-10-CM | POA: Diagnosis not present

## 2019-07-03 DIAGNOSIS — C8338 Diffuse large B-cell lymphoma, lymph nodes of multiple sites: Secondary | ICD-10-CM | POA: Diagnosis not present

## 2019-07-04 ENCOUNTER — Ambulatory Visit
Admission: RE | Admit: 2019-07-04 | Discharge: 2019-07-04 | Disposition: A | Discharge status: Home / Self Care | Payer: Medicare HMO | Source: Ambulatory Visit | Attending: Radiation Oncology | Admitting: Radiation Oncology

## 2019-07-04 ENCOUNTER — Other Ambulatory Visit: Payer: Self-pay

## 2019-07-04 DIAGNOSIS — Z510 Encounter for antineoplastic radiation therapy: Secondary | ICD-10-CM | POA: Diagnosis not present

## 2019-07-04 DIAGNOSIS — C8331 Diffuse large B-cell lymphoma, lymph nodes of head, face, and neck: Secondary | ICD-10-CM | POA: Diagnosis not present

## 2019-07-04 DIAGNOSIS — C8338 Diffuse large B-cell lymphoma, lymph nodes of multiple sites: Secondary | ICD-10-CM | POA: Diagnosis not present

## 2019-07-05 ENCOUNTER — Other Ambulatory Visit: Payer: Self-pay

## 2019-07-05 ENCOUNTER — Ambulatory Visit
Admission: RE | Admit: 2019-07-05 | Discharge: 2019-07-05 | Disposition: A | Discharge status: Home / Self Care | Payer: Medicare HMO | Source: Ambulatory Visit | Attending: Radiation Oncology | Admitting: Radiation Oncology

## 2019-07-05 DIAGNOSIS — C8331 Diffuse large B-cell lymphoma, lymph nodes of head, face, and neck: Secondary | ICD-10-CM | POA: Diagnosis not present

## 2019-07-05 DIAGNOSIS — Z510 Encounter for antineoplastic radiation therapy: Secondary | ICD-10-CM | POA: Diagnosis not present

## 2019-07-05 DIAGNOSIS — C8338 Diffuse large B-cell lymphoma, lymph nodes of multiple sites: Secondary | ICD-10-CM | POA: Diagnosis not present

## 2019-07-06 ENCOUNTER — Other Ambulatory Visit: Payer: Self-pay

## 2019-07-06 ENCOUNTER — Ambulatory Visit
Admission: RE | Admit: 2019-07-06 | Discharge: 2019-07-06 | Disposition: A | Discharge status: Home / Self Care | Payer: Medicare HMO | Source: Ambulatory Visit | Attending: Radiation Oncology | Admitting: Radiation Oncology

## 2019-07-06 DIAGNOSIS — Z510 Encounter for antineoplastic radiation therapy: Secondary | ICD-10-CM | POA: Diagnosis not present

## 2019-07-06 DIAGNOSIS — C8331 Diffuse large B-cell lymphoma, lymph nodes of head, face, and neck: Secondary | ICD-10-CM | POA: Diagnosis not present

## 2019-07-06 DIAGNOSIS — C8338 Diffuse large B-cell lymphoma, lymph nodes of multiple sites: Secondary | ICD-10-CM | POA: Diagnosis not present

## 2019-07-09 ENCOUNTER — Other Ambulatory Visit: Payer: Self-pay

## 2019-07-09 ENCOUNTER — Ambulatory Visit
Admission: RE | Admit: 2019-07-09 | Discharge: 2019-07-09 | Disposition: A | Discharge status: Home / Self Care | Payer: Medicare HMO | Source: Ambulatory Visit | Attending: Radiation Oncology | Admitting: Radiation Oncology

## 2019-07-09 DIAGNOSIS — C8338 Diffuse large B-cell lymphoma, lymph nodes of multiple sites: Secondary | ICD-10-CM | POA: Diagnosis not present

## 2019-07-09 DIAGNOSIS — Z510 Encounter for antineoplastic radiation therapy: Secondary | ICD-10-CM | POA: Diagnosis not present

## 2019-07-09 DIAGNOSIS — C8331 Diffuse large B-cell lymphoma, lymph nodes of head, face, and neck: Secondary | ICD-10-CM | POA: Diagnosis not present

## 2019-07-10 ENCOUNTER — Other Ambulatory Visit: Payer: Self-pay

## 2019-07-10 ENCOUNTER — Ambulatory Visit
Admission: RE | Admit: 2019-07-10 | Discharge: 2019-07-10 | Disposition: A | Discharge status: Home / Self Care | Payer: Medicare HMO | Source: Ambulatory Visit | Attending: Radiation Oncology | Admitting: Radiation Oncology

## 2019-07-10 DIAGNOSIS — C8331 Diffuse large B-cell lymphoma, lymph nodes of head, face, and neck: Secondary | ICD-10-CM | POA: Diagnosis not present

## 2019-07-10 DIAGNOSIS — Z510 Encounter for antineoplastic radiation therapy: Secondary | ICD-10-CM | POA: Diagnosis not present

## 2019-07-10 DIAGNOSIS — C8338 Diffuse large B-cell lymphoma, lymph nodes of multiple sites: Secondary | ICD-10-CM | POA: Diagnosis not present

## 2019-07-11 ENCOUNTER — Ambulatory Visit
Admission: RE | Admit: 2019-07-11 | Discharge: 2019-07-11 | Disposition: A | Discharge status: Home / Self Care | Payer: Medicare HMO | Source: Ambulatory Visit | Attending: Radiation Oncology | Admitting: Radiation Oncology

## 2019-07-11 ENCOUNTER — Other Ambulatory Visit: Payer: Self-pay

## 2019-07-11 DIAGNOSIS — C8331 Diffuse large B-cell lymphoma, lymph nodes of head, face, and neck: Secondary | ICD-10-CM | POA: Diagnosis not present

## 2019-07-11 DIAGNOSIS — C8338 Diffuse large B-cell lymphoma, lymph nodes of multiple sites: Secondary | ICD-10-CM | POA: Diagnosis not present

## 2019-07-11 DIAGNOSIS — Z510 Encounter for antineoplastic radiation therapy: Secondary | ICD-10-CM | POA: Diagnosis not present

## 2019-07-12 ENCOUNTER — Ambulatory Visit: Payer: Medicare HMO

## 2019-07-13 ENCOUNTER — Other Ambulatory Visit: Payer: Self-pay

## 2019-07-13 ENCOUNTER — Other Ambulatory Visit: Payer: Self-pay | Admitting: Hematology & Oncology

## 2019-07-13 ENCOUNTER — Ambulatory Visit
Admission: RE | Admit: 2019-07-13 | Discharge: 2019-07-13 | Disposition: A | Discharge status: Home / Self Care | Payer: Medicare HMO | Source: Ambulatory Visit | Attending: Radiation Oncology | Admitting: Radiation Oncology

## 2019-07-13 DIAGNOSIS — C8331 Diffuse large B-cell lymphoma, lymph nodes of head, face, and neck: Secondary | ICD-10-CM | POA: Diagnosis not present

## 2019-07-13 DIAGNOSIS — Z510 Encounter for antineoplastic radiation therapy: Secondary | ICD-10-CM | POA: Diagnosis not present

## 2019-07-13 DIAGNOSIS — C8338 Diffuse large B-cell lymphoma, lymph nodes of multiple sites: Secondary | ICD-10-CM | POA: Diagnosis not present

## 2019-07-16 ENCOUNTER — Other Ambulatory Visit: Payer: Self-pay

## 2019-07-16 ENCOUNTER — Ambulatory Visit
Admission: RE | Admit: 2019-07-16 | Discharge: 2019-07-16 | Disposition: A | Discharge status: Home / Self Care | Payer: Medicare HMO | Source: Ambulatory Visit | Attending: Radiation Oncology | Admitting: Radiation Oncology

## 2019-07-16 DIAGNOSIS — C8331 Diffuse large B-cell lymphoma, lymph nodes of head, face, and neck: Secondary | ICD-10-CM | POA: Diagnosis not present

## 2019-07-16 DIAGNOSIS — Z510 Encounter for antineoplastic radiation therapy: Secondary | ICD-10-CM | POA: Diagnosis not present

## 2019-07-16 DIAGNOSIS — C8338 Diffuse large B-cell lymphoma, lymph nodes of multiple sites: Secondary | ICD-10-CM | POA: Diagnosis not present

## 2019-07-17 ENCOUNTER — Other Ambulatory Visit: Payer: Self-pay

## 2019-07-17 ENCOUNTER — Ambulatory Visit
Admission: RE | Admit: 2019-07-17 | Discharge: 2019-07-17 | Disposition: A | Discharge status: Home / Self Care | Payer: Medicare HMO | Source: Ambulatory Visit | Attending: Radiation Oncology | Admitting: Radiation Oncology

## 2019-07-17 DIAGNOSIS — C8338 Diffuse large B-cell lymphoma, lymph nodes of multiple sites: Secondary | ICD-10-CM | POA: Diagnosis not present

## 2019-07-17 DIAGNOSIS — C8331 Diffuse large B-cell lymphoma, lymph nodes of head, face, and neck: Secondary | ICD-10-CM | POA: Diagnosis not present

## 2019-07-17 DIAGNOSIS — Z510 Encounter for antineoplastic radiation therapy: Secondary | ICD-10-CM | POA: Diagnosis not present

## 2019-07-18 ENCOUNTER — Ambulatory Visit
Admission: RE | Admit: 2019-07-18 | Discharge: 2019-07-18 | Disposition: A | Discharge status: Home / Self Care | Payer: Medicare HMO | Source: Ambulatory Visit | Attending: Radiation Oncology | Admitting: Radiation Oncology

## 2019-07-18 ENCOUNTER — Other Ambulatory Visit: Payer: Self-pay

## 2019-07-18 DIAGNOSIS — C8331 Diffuse large B-cell lymphoma, lymph nodes of head, face, and neck: Secondary | ICD-10-CM | POA: Diagnosis not present

## 2019-07-18 DIAGNOSIS — Z510 Encounter for antineoplastic radiation therapy: Secondary | ICD-10-CM | POA: Diagnosis not present

## 2019-07-18 DIAGNOSIS — C8338 Diffuse large B-cell lymphoma, lymph nodes of multiple sites: Secondary | ICD-10-CM | POA: Diagnosis not present

## 2019-07-19 ENCOUNTER — Other Ambulatory Visit: Payer: Self-pay

## 2019-07-19 ENCOUNTER — Ambulatory Visit
Admission: RE | Admit: 2019-07-19 | Discharge: 2019-07-19 | Disposition: A | Discharge status: Home / Self Care | Payer: Medicare HMO | Source: Ambulatory Visit | Attending: Radiation Oncology | Admitting: Radiation Oncology

## 2019-07-19 DIAGNOSIS — C8331 Diffuse large B-cell lymphoma, lymph nodes of head, face, and neck: Secondary | ICD-10-CM | POA: Diagnosis not present

## 2019-07-19 DIAGNOSIS — Z510 Encounter for antineoplastic radiation therapy: Secondary | ICD-10-CM | POA: Diagnosis not present

## 2019-07-19 DIAGNOSIS — C8338 Diffuse large B-cell lymphoma, lymph nodes of multiple sites: Secondary | ICD-10-CM | POA: Diagnosis not present

## 2019-07-20 ENCOUNTER — Ambulatory Visit
Admission: RE | Admit: 2019-07-20 | Discharge: 2019-07-20 | Disposition: A | Discharge status: Home / Self Care | Payer: Medicare HMO | Source: Ambulatory Visit | Attending: Radiation Oncology | Admitting: Radiation Oncology

## 2019-07-20 ENCOUNTER — Other Ambulatory Visit: Payer: Self-pay

## 2019-07-20 DIAGNOSIS — C8338 Diffuse large B-cell lymphoma, lymph nodes of multiple sites: Secondary | ICD-10-CM | POA: Diagnosis not present

## 2019-07-20 DIAGNOSIS — C8331 Diffuse large B-cell lymphoma, lymph nodes of head, face, and neck: Secondary | ICD-10-CM | POA: Diagnosis not present

## 2019-07-20 DIAGNOSIS — Z510 Encounter for antineoplastic radiation therapy: Secondary | ICD-10-CM | POA: Diagnosis not present

## 2019-07-23 ENCOUNTER — Inpatient Hospital Stay (HOSPITAL_BASED_OUTPATIENT_CLINIC_OR_DEPARTMENT_OTHER): Payer: Medicare HMO | Admitting: Hematology & Oncology

## 2019-07-23 ENCOUNTER — Ambulatory Visit
Admission: RE | Admit: 2019-07-23 | Discharge: 2019-07-23 | Disposition: A | Discharge status: Home / Self Care | Payer: Medicare HMO | Source: Ambulatory Visit | Attending: Radiation Oncology | Admitting: Radiation Oncology

## 2019-07-23 ENCOUNTER — Encounter: Payer: Self-pay | Admitting: Hematology & Oncology

## 2019-07-23 ENCOUNTER — Other Ambulatory Visit: Payer: Self-pay

## 2019-07-23 ENCOUNTER — Ambulatory Visit: Payer: Medicare HMO | Admitting: Hematology & Oncology

## 2019-07-23 ENCOUNTER — Inpatient Hospital Stay: Payer: Medicare HMO

## 2019-07-23 ENCOUNTER — Inpatient Hospital Stay: Payer: Medicare HMO | Attending: Hematology & Oncology

## 2019-07-23 VITALS — BP 127/90 | HR 105 | Temp 97.3°F | Resp 18

## 2019-07-23 DIAGNOSIS — Z95828 Presence of other vascular implants and grafts: Secondary | ICD-10-CM

## 2019-07-23 DIAGNOSIS — C8338 Diffuse large B-cell lymphoma, lymph nodes of multiple sites: Secondary | ICD-10-CM | POA: Diagnosis not present

## 2019-07-23 DIAGNOSIS — Z452 Encounter for adjustment and management of vascular access device: Secondary | ICD-10-CM | POA: Diagnosis not present

## 2019-07-23 DIAGNOSIS — Z510 Encounter for antineoplastic radiation therapy: Secondary | ICD-10-CM | POA: Insufficient documentation

## 2019-07-23 DIAGNOSIS — C8331 Diffuse large B-cell lymphoma, lymph nodes of head, face, and neck: Secondary | ICD-10-CM | POA: Insufficient documentation

## 2019-07-23 DIAGNOSIS — C9110 Chronic lymphocytic leukemia of B-cell type not having achieved remission: Secondary | ICD-10-CM | POA: Diagnosis not present

## 2019-07-23 LAB — LACTATE DEHYDROGENASE: LDH: 164 U/L (ref 98–192)

## 2019-07-23 LAB — CMP (CANCER CENTER ONLY)
ALT: 21 U/L (ref 0–44)
AST: 16 U/L (ref 15–41)
Albumin: 4.3 g/dL (ref 3.5–5.0)
Alkaline Phosphatase: 67 U/L (ref 38–126)
Anion gap: 7 (ref 5–15)
BUN: 9 mg/dL (ref 8–23)
CO2: 29 mmol/L (ref 22–32)
Calcium: 9.5 mg/dL (ref 8.9–10.3)
Chloride: 105 mmol/L (ref 98–111)
Creatinine: 1.05 mg/dL (ref 0.61–1.24)
GFR, Est AFR Am: >60 mL/min (ref >60)
GFR, Estimated: >60 mL/min (ref >60)
Glucose, Bld: 255 mg/dL — ABNORMAL HIGH (ref 70–99)
Potassium: 3.8 mmol/L (ref 3.5–5.1)
Sodium: 141 mmol/L (ref 135–145)
Total Bilirubin: 0.5 mg/dL (ref 0.3–1.2)
Total Protein: 6.4 g/dL — ABNORMAL LOW (ref 6.5–8.1)

## 2019-07-23 LAB — CBC WITH DIFFERENTIAL (CANCER CENTER ONLY)
Abs Immature Granulocytes: 0.02 10*3/uL (ref 0.00–0.07)
Basophils Absolute: 0 10*3/uL (ref 0.0–0.1)
Basophils Relative: 1 %
Eosinophils Absolute: 0.1 10*3/uL (ref 0.0–0.5)
Eosinophils Relative: 3 %
HCT: 39.2 % (ref 39.0–52.0)
Hemoglobin: 14.1 g/dL (ref 13.0–17.0)
Immature Granulocytes: 1 %
Lymphocytes Relative: 18 %
Lymphs Abs: 0.6 10*3/uL — ABNORMAL LOW (ref 0.7–4.0)
MCH: 30.7 pg (ref 26.0–34.0)
MCHC: 36 g/dL (ref 30.0–36.0)
MCV: 85.2 fL (ref 80.0–100.0)
Monocytes Absolute: 0.4 10*3/uL (ref 0.1–1.0)
Monocytes Relative: 14 %
Neutro Abs: 2 10*3/uL (ref 1.7–7.7)
Neutrophils Relative %: 63 %
Platelet Count: 169 10*3/uL (ref 150–400)
RBC: 4.6 MIL/uL (ref 4.22–5.81)
RDW: 12.8 % (ref 11.5–15.5)
WBC Count: 3.1 10*3/uL — ABNORMAL LOW (ref 4.0–10.5)
nRBC: 0 % (ref 0.0–0.2)

## 2019-07-23 MED ORDER — FLUCONAZOLE 100 MG PO TABS: 100.0000 mg | ORAL_TABLET | Freq: Every day | ORAL | 0 refills | Status: DC

## 2019-07-23 MED ORDER — HEPARIN SOD (PORK) LOCK FLUSH 100 UNIT/ML IV SOLN
500.0000 [IU] | Freq: Once | INTRAVENOUS | Status: AC
  Administered 2019-07-23: 500 [IU] via INTRAVENOUS
  Filled 2019-07-23: qty 5

## 2019-07-23 MED ORDER — SODIUM CHLORIDE 0.9% FLUSH
10.0000 mL | INTRAVENOUS | Status: DC | PRN
  Administered 2019-07-23: 10 mL via INTRAVENOUS
  Filled 2019-07-23: qty 10

## 2019-07-23 NOTE — Patient Instructions (Signed)

## 2019-07-23 NOTE — Progress Notes (Signed)
Hematology and Oncology Follow Up Visit  Ronald Rice [id number] 1946-02-06 74 y.o. 07/23/2019   Principle Diagnosis:   Diffuse large cell non-Hodgkin's lymphoma-Richter's transformation from CLL  Current Therapy:    R-CHOP-s/p cycle #8-- started on 11/08/2018  XRT to the LEFT neck     Interim History:  Ronald Rice is back for for follow-up.  He is doing okay.  He is not sure how many radiation treatments he has had.  I have to believe that he probably has had 15 so far.  He is not sure how many more he has left.  The left neck looks great.  He has had a little bit of hyperpigmentation.  He has had no problems swallowing.  Has little bit of thrush on his tongue.  His blood sugars are horrible.  His blood sugar today is 255.  I am unsure how well he is controlling this at home.  He has had no problems with fever.  He has had no bleeding.  I am not sure how much she is really able to walk.  He has had the left foot issues.  I am unsure if is getting any physical therapy.  Nent.  Overall, his performance status is ECOG 1.      Medications:  Current Outpatient Medications:  .  acetaminophen (TYLENOL) 500 MG tablet, Take 1,000 mg by mouth every 6 (six) hours as needed (pain). , Disp: , Rfl:  .  allopurinol (ZYLOPRIM) 300 MG tablet, Take 300 mg by mouth daily., Disp: , Rfl:  .  amoxicillin-clavulanate (AUGMENTIN) 875-125 MG tablet, Take 1 tablet by mouth 2 (two) times daily., Disp: 30 tablet, Rfl: 1 .  aspirin EC 81 MG tablet, Take 81 mg by mouth daily., Disp: , Rfl:  .  atenolol (TENORMIN) 25 MG tablet, Take 25 mg by mouth daily. , Disp: , Rfl:  .  atorvastatin (LIPITOR) 40 MG tablet, Take 40 mg by mouth daily., Disp: , Rfl:  .  CINNAMON PO, Take 1 tablet by mouth daily., Disp: , Rfl:  .  diclofenac sodium (VOLTAREN) 1 % GEL, Apply 1 application topically as needed (pain). , Disp: , Rfl:  .  diphenhydrAMINE (SIMPLY SLEEP) 25 MG tablet, Take 25 mg by mouth at bedtime as needed for  sleep., Disp: , Rfl:  .  donepezil (ARICEPT ODT) 5 MG disintegrating tablet, Take 5 mg by mouth at bedtime., Disp: , Rfl:  .  DULoxetine (CYMBALTA) 30 MG capsule, TAKE 1 CAPSULE(30 MG) BY MOUTH DAILY, Disp: 30 capsule, Rfl: 3 .  famotidine (PEPCID) 20 MG tablet, Take 20 mg by mouth at bedtime as needed for heartburn. , Disp: , Rfl:  .  gabapentin (NEURONTIN) 300 MG capsule, 300 mg., Disp: , Rfl:  .  hydrochlorothiazide (HYDRODIURIL) 25 MG tablet, Take 25 mg by mouth daily. , Disp: , Rfl:  .  lidocaine-prilocaine (EMLA) cream, Apply to PAC 1 hour prior to procedure., Disp: 30 g, Rfl: 3 .  lisinopril (ZESTRIL) 40 MG tablet, Take 1 tablet (40 mg total) by mouth daily for 30 days., Disp: 30 tablet, Rfl: 0 .  loratadine (CLARITIN) 10 MG tablet, Take 10 mg by mouth daily., Disp: , Rfl:  .  metFORMIN (GLUCOPHAGE-XR) 500 MG 24 hr tablet, Take 500 mg by mouth every evening. , Disp: , Rfl:  .  NON FORMULARY, Take 2 tablets by mouth daily. Blood Sugar Defense , Disp: , Rfl:  .  ondansetron (ZOFRAN) 8 MG tablet, Take 1 tablet (8 mg total) by  mouth 2 (two) times daily as needed for refractory nausea / vomiting. Start on day 3 after cyclophosphamide chemotherapy., Disp: 30 tablet, Rfl: 1 .  potassium chloride (K-DUR) 10 MEQ tablet, Take 10 mEq by mouth daily. , Disp: , Rfl:  .  predniSONE (DELTASONE) 20 MG tablet, TAKE 3 TABLETS BY MOUTH DAILY. TAKE ON DAYS 1 THROUGH 5 OF CHEMOTHERAPY, Disp: 15 tablet, Rfl: 6 .  prochlorperazine (COMPAZINE) 10 MG tablet, Take 1 tablet (10 mg total) by mouth every 6 (six) hours as needed for up to 5 days (Nausea or vomiting)., Disp: 20 tablet, Rfl: 0 .  Pyridoxine HCl (VITAMIN B-6) 250 MG tablet, Take 1 tablet (250 mg total) by mouth daily., Disp: 30 tablet, Rfl: 6 .  tamsulosin (FLOMAX) 0.4 MG CAPS capsule, Take 0.4 mg by mouth daily., Disp: , Rfl:  No current facility-administered medications for this visit.  Facility-Administered Medications Ordered in Other Visits:  .   heparin lock flush 100 unit/mL, 500 Units, Intravenous, Once, Zaria Taha R, MD .  sodium chloride flush (NS) 0.9 % injection 10 mL, 10 mL, Intravenous, PRN, Volanda Napoleon, MD, 10 mL at 07/23/19 1130  Allergies:  No Known Allergies  Past Medical History, Surgical history, Social history, and Family History were reviewed and updated.  Review of Systems: Review of Systems  Constitutional: Negative.   HENT:  Negative.   Eyes: Negative.   Respiratory: Negative.   Cardiovascular: Negative.   Gastrointestinal: Negative.   Endocrine: Negative.   Genitourinary: Positive for nocturia.   Musculoskeletal: Positive for arthralgias, gait problem and myalgias.  Neurological: Positive for gait problem.  Hematological: Negative.   Psychiatric/Behavioral: Negative.     Physical Exam:  temporal temperature is 97.3 F (36.3 C) (abnormal). His blood pressure is 127/90 and his pulse is 105 (abnormal). His respiration is 18 and oxygen saturation is 96%.   Wt Readings from Last 3 Encounters:  06/20/19 283 lb 8 oz (128.6 kg)  06/12/19 273 lb (123.8 kg)  04/25/19 284 lb 1.9 oz (128.9 kg)    Physical Exam Vitals reviewed.  HENT:     Head: Normocephalic and atraumatic.  Eyes:     Pupils: Pupils are equal, round, and reactive to light.  Neck:     Comments: Examination of his neck shows marked improvement in the adenopathy in the left neck.  He still has some palpable posterior cervical lymphadenopathy.  It probably measures about 2 x 2 cm.  It is not as firm.  It is somewhat mobile.. Cardiovascular:     Rate and Rhythm: Normal rate and regular rhythm.     Heart sounds: Normal heart sounds.  Pulmonary:     Effort: Pulmonary effort is normal.     Breath sounds: Normal breath sounds.  Abdominal:     General: Bowel sounds are normal.     Palpations: Abdomen is soft.  Musculoskeletal:        General: No tenderness or deformity. Normal range of motion.     Comments: He has a cast on the  right foot and lower leg.  This goes about two thirds the way up his right leg.  Left leg is unremarkable.  He has no swelling.  He has decent pulses.  He has good range of motion of his joints.  Lymphadenopathy:     Cervical: No cervical adenopathy.  Skin:    General: Skin is warm and dry.     Findings: No erythema or rash.  Neurological:  Mental Status: He is alert and oriented to person, place, and time.  Psychiatric:        Behavior: Behavior normal.        Thought Content: Thought content normal.        Judgment: Judgment normal.      Lab Results  Component Value Date   WBC 3.1 (L) 07/23/2019   HGB 14.1 07/23/2019   HCT 39.2 07/23/2019   MCV 85.2 07/23/2019   PLT 169 07/23/2019     Chemistry      Component Value Date/Time   NA 141 07/23/2019 1203   NA 142 01/25/2017 1217   K 3.8 07/23/2019 1203   K 4.1 01/25/2017 1217   CL 105 07/23/2019 1203   CL 102 01/25/2017 1217   CO2 29 07/23/2019 1203   CO2 33 01/25/2017 1217   BUN 9 07/23/2019 1203   BUN 12 01/25/2017 1217   CREATININE 1.05 07/23/2019 1203   CREATININE 1.4 (H) 01/25/2017 1217      Component Value Date/Time   CALCIUM 9.5 07/23/2019 1203   CALCIUM 9.7 01/25/2017 1217   ALKPHOS 67 07/23/2019 1203   ALKPHOS 59 01/25/2017 1217   AST 16 07/23/2019 1203   ALT 21 07/23/2019 1203   ALT 32 01/25/2017 1217   BILITOT 0.5 07/23/2019 1203       Impression and Plan: Ronald Rice is a 74 year old African-American male.  He was diagnosed with CLL back in [place].  I have to believe that his radiation treatments will be over in a week or so.  I probably would not repeat a PET scan until May at the earliest.  I just wish his blood sugars were a little bit better.  I would like to see him back in 6 weeks.  At that point, he will have his Port-A-Cath flushed.  Volanda Napoleon, MD 3/1/20211:15 PM

## 2019-07-24 ENCOUNTER — Other Ambulatory Visit: Payer: Self-pay

## 2019-07-24 ENCOUNTER — Ambulatory Visit
Admission: RE | Admit: 2019-07-24 | Discharge: 2019-07-24 | Disposition: A | Discharge status: Home / Self Care | Payer: Medicare HMO | Source: Ambulatory Visit | Attending: Radiation Oncology | Admitting: Radiation Oncology

## 2019-07-24 DIAGNOSIS — Z510 Encounter for antineoplastic radiation therapy: Secondary | ICD-10-CM | POA: Diagnosis not present

## 2019-07-24 DIAGNOSIS — C8338 Diffuse large B-cell lymphoma, lymph nodes of multiple sites: Secondary | ICD-10-CM | POA: Diagnosis not present

## 2019-07-24 DIAGNOSIS — C8331 Diffuse large B-cell lymphoma, lymph nodes of head, face, and neck: Secondary | ICD-10-CM | POA: Diagnosis not present

## 2019-07-25 ENCOUNTER — Other Ambulatory Visit: Payer: Self-pay

## 2019-07-25 ENCOUNTER — Ambulatory Visit
Admission: RE | Admit: 2019-07-25 | Discharge: 2019-07-25 | Disposition: A | Discharge status: Home / Self Care | Payer: Medicare HMO | Source: Ambulatory Visit | Attending: Radiation Oncology | Admitting: Radiation Oncology

## 2019-07-25 DIAGNOSIS — C8331 Diffuse large B-cell lymphoma, lymph nodes of head, face, and neck: Secondary | ICD-10-CM | POA: Diagnosis not present

## 2019-07-25 DIAGNOSIS — C8338 Diffuse large B-cell lymphoma, lymph nodes of multiple sites: Secondary | ICD-10-CM | POA: Diagnosis not present

## 2019-07-25 DIAGNOSIS — Z510 Encounter for antineoplastic radiation therapy: Secondary | ICD-10-CM | POA: Diagnosis not present

## 2019-07-26 ENCOUNTER — Other Ambulatory Visit: Payer: Self-pay

## 2019-07-26 ENCOUNTER — Ambulatory Visit
Admission: RE | Admit: 2019-07-26 | Discharge: 2019-07-26 | Disposition: A | Discharge status: Home / Self Care | Payer: Medicare HMO | Source: Ambulatory Visit | Attending: Radiation Oncology | Admitting: Radiation Oncology

## 2019-07-26 DIAGNOSIS — Z510 Encounter for antineoplastic radiation therapy: Secondary | ICD-10-CM | POA: Diagnosis not present

## 2019-07-26 DIAGNOSIS — C8338 Diffuse large B-cell lymphoma, lymph nodes of multiple sites: Secondary | ICD-10-CM | POA: Diagnosis not present

## 2019-07-26 DIAGNOSIS — C8331 Diffuse large B-cell lymphoma, lymph nodes of head, face, and neck: Secondary | ICD-10-CM | POA: Diagnosis not present

## 2019-07-27 ENCOUNTER — Other Ambulatory Visit: Payer: Self-pay

## 2019-07-27 ENCOUNTER — Ambulatory Visit
Admission: RE | Admit: 2019-07-27 | Discharge: 2019-07-27 | Disposition: A | Discharge status: Home / Self Care | Payer: Medicare HMO | Source: Ambulatory Visit | Attending: Radiation Oncology | Admitting: Radiation Oncology

## 2019-07-27 DIAGNOSIS — Z510 Encounter for antineoplastic radiation therapy: Secondary | ICD-10-CM | POA: Diagnosis not present

## 2019-07-27 DIAGNOSIS — C8331 Diffuse large B-cell lymphoma, lymph nodes of head, face, and neck: Secondary | ICD-10-CM | POA: Diagnosis not present

## 2019-07-27 DIAGNOSIS — C8338 Diffuse large B-cell lymphoma, lymph nodes of multiple sites: Secondary | ICD-10-CM | POA: Diagnosis not present

## 2019-07-30 ENCOUNTER — Other Ambulatory Visit: Payer: Self-pay

## 2019-07-30 ENCOUNTER — Ambulatory Visit
Admission: RE | Admit: 2019-07-30 | Discharge: 2019-07-30 | Disposition: A | Discharge status: Home / Self Care | Payer: Medicare HMO | Source: Ambulatory Visit | Attending: Radiation Oncology | Admitting: Radiation Oncology

## 2019-07-30 DIAGNOSIS — Z510 Encounter for antineoplastic radiation therapy: Secondary | ICD-10-CM | POA: Diagnosis not present

## 2019-07-30 DIAGNOSIS — C8338 Diffuse large B-cell lymphoma, lymph nodes of multiple sites: Secondary | ICD-10-CM | POA: Diagnosis not present

## 2019-07-30 DIAGNOSIS — C8331 Diffuse large B-cell lymphoma, lymph nodes of head, face, and neck: Secondary | ICD-10-CM | POA: Diagnosis not present

## 2019-07-31 ENCOUNTER — Other Ambulatory Visit: Payer: Self-pay

## 2019-07-31 ENCOUNTER — Ambulatory Visit
Admission: RE | Admit: 2019-07-31 | Discharge: 2019-07-31 | Disposition: A | Discharge status: Home / Self Care | Payer: Medicare HMO | Source: Ambulatory Visit | Attending: Radiation Oncology | Admitting: Radiation Oncology

## 2019-07-31 DIAGNOSIS — C8338 Diffuse large B-cell lymphoma, lymph nodes of multiple sites: Secondary | ICD-10-CM | POA: Diagnosis not present

## 2019-07-31 DIAGNOSIS — C8331 Diffuse large B-cell lymphoma, lymph nodes of head, face, and neck: Secondary | ICD-10-CM | POA: Diagnosis not present

## 2019-07-31 DIAGNOSIS — Z510 Encounter for antineoplastic radiation therapy: Secondary | ICD-10-CM | POA: Diagnosis not present

## 2019-08-01 ENCOUNTER — Encounter: Payer: Self-pay | Admitting: Radiation Oncology

## 2019-08-01 ENCOUNTER — Other Ambulatory Visit: Payer: Self-pay

## 2019-08-01 ENCOUNTER — Ambulatory Visit
Admission: RE | Admit: 2019-08-01 | Discharge: 2019-08-01 | Disposition: A | Discharge status: Home / Self Care | Payer: Medicare HMO | Source: Ambulatory Visit | Attending: Radiation Oncology | Admitting: Radiation Oncology

## 2019-08-01 DIAGNOSIS — Z510 Encounter for antineoplastic radiation therapy: Secondary | ICD-10-CM | POA: Diagnosis not present

## 2019-08-01 DIAGNOSIS — C8338 Diffuse large B-cell lymphoma, lymph nodes of multiple sites: Secondary | ICD-10-CM | POA: Diagnosis not present

## 2019-08-01 DIAGNOSIS — C8331 Diffuse large B-cell lymphoma, lymph nodes of head, face, and neck: Secondary | ICD-10-CM | POA: Diagnosis not present

## 2019-08-07 ENCOUNTER — Ambulatory Visit: Payer: Medicare HMO | Admitting: Neurology

## 2019-08-07 ENCOUNTER — Encounter: Payer: Self-pay | Admitting: Neurology

## 2019-08-07 ENCOUNTER — Other Ambulatory Visit: Payer: Self-pay

## 2019-08-07 VITALS — BP 141/83 | HR 97 | Temp 96.8°F | Ht 74.0 in | Wt 281.0 lb

## 2019-08-07 DIAGNOSIS — G63 Polyneuropathy in diseases classified elsewhere: Secondary | ICD-10-CM | POA: Insufficient documentation

## 2019-08-07 DIAGNOSIS — R269 Unspecified abnormalities of gait and mobility: Secondary | ICD-10-CM

## 2019-08-07 DIAGNOSIS — M5416 Radiculopathy, lumbar region: Secondary | ICD-10-CM

## 2019-08-07 DIAGNOSIS — R531 Weakness: Secondary | ICD-10-CM | POA: Diagnosis not present

## 2019-08-07 NOTE — Progress Notes (Signed)
PATIENT: Ronald Rice DOB: 11-14-1945  Chief Complaint  Patient presents with  . Peripheral Neuropathy    He has returned for worsening paresthesia in bilateral hands and feet. Also, he is concerned about the weakness in his legs. He has to use a rolling walker to assist with ambulation.  At times, his legs will buckle without notice. Luckily, he has been able to avoid falls.   Marland Kitchen PCP    Deland Pretty, MD      HISTORICAL  Ronald Rice is a 74 year old male, seen in request by his primary care physician Dr. Deland Pretty for evaluation of worsening paresthesia of bilateral hands and feet, gait abnormality, he is accompanied by his wife at today's clinical visit,  I have seen him previously in February 2019.  he has past medical history of hypertension, hyperlipidemia, gout, diabetes, coronary artery disease, obesity  He had multiple lumbar decompression surgery, this was all done in [place], most recent lumbar decompression was in 2016, also had anterior cervical decompression fusion C4-5 with instrumentation and allograft on April 20, 2017, history of chronic lymphocytic leukemia, was treated at [place] with chemotherapy,  He reported gradual onset gait abnormality since 2014, gradually getting worse, despite the surgery, worsening low back pain, sometimes radiating pain to right leg, increased weakness in his right leg, also has occasionally bowel incontinence, urinary incontinence.  MRI of cervical spine March 2019, evidence of C3-7 fusion, metal artifact, residual mild stenosis at C3-4, C4-5, but no evidence of cord compression, there appears to be spinal cord atrophy at C3-4, C4-5, sagittal STIR imaging appears to show small foci of increased signal within the spinal cord adjacent to C4-5,  MRI of thoracic spine showed multilevel degenerative disc disease, facet arthropathy of the thoracic spine, central disc protrusion at T3-4, T4-5 contact the ventral cord, no spinal  cord and neural foraminal stenosis at this level, questionable increased central cord signal at T6-7, T7-8,  MRI of the lumbar spine in February 2019, status post L3 through S1 posterior decompression and fusion, moderate L1 compression fracture, status post cement augmentation, stable degenerative changes of lumbar spine, moderate canal stenosis at L2 and 3, most compatible with adjacent segment disease, multilevel neuroforaminal narrowing, moderate on the left at L1-2,  Patient complains of radiating pain from right lower back to right lower extremity, after bearing weight, sometimes with body changing position turning towards the right side, he has persistent bilateral distal ankle weakness,  EMG nerve conduction study in March 2019 showed evidence of chronic bilateral lumbosacral radiculopathy, right more obvious than the left side, mainly involving bilateral L3, L4 myotomes.  There is also evidence of severe right carpal tunnel syndrome, in the background of mild axonal sensorimotor polyneuropathy.  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  Since his visit in 2019, he was under the care of oncologist Dr. 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, R-CHOP started on November 03, 2018, status post cycle #8, currently receiving radiation therapy to left neck,  He complains lower extremity feeling cold all the time, numbness, significant lower extremity weakness, buckling underneath him, rely on his upper extremity distress to hold on to  walker, worsening bilateral hands numbness, he also has bowel and bladder urgency, occasionally incontinence,   REVIEW OF SYSTEMS: Full 14 system review of systems performed and notable only for as above All other review of systems were negative.  ALLERGIES: No Known Allergies  HOME MEDICATIONS: Current Outpatient Medications  Medication Sig Dispense Refill  . acetaminophen (TYLENOL) 500 MG tablet Take 1,000 mg by mouth every 6 (six) hours as needed (pain).     Marland Kitchen allopurinol (ZYLOPRIM) 300 MG tablet Take 300 mg by mouth daily.    Marland Kitchen aspirin EC 81 MG tablet Take 81 mg by mouth daily.    Marland Kitchen atenolol (TENORMIN) 25 MG tablet Take 25 mg by mouth daily.     Marland Kitchen atorvastatin (LIPITOR) 40 MG tablet Take 40 mg by mouth daily.    . Cholecalciferol (VITAMIN D3 PO) Take 1 tablet by mouth daily.    Marland Kitchen CINNAMON PO Take 1 tablet by mouth daily.    . diclofenac sodium (VOLTAREN) 1 % GEL Apply 1 application topically as needed (pain).     Marland Kitchen donepezil (ARICEPT ODT) 5 MG disintegrating tablet Take 5 mg by mouth at bedtime.    . DULoxetine (CYMBALTA) 30 MG capsule TAKE 1 CAPSULE(30 MG) BY MOUTH DAILY 30 capsule 3  . famotidine (PEPCID) 20 MG tablet Take 20 mg by mouth at bedtime as needed for heartburn.     . hydrochlorothiazide (HYDRODIURIL) 25 MG tablet Take 25 mg by mouth daily.     Marland Kitchen lidocaine-prilocaine (EMLA) cream Apply to PAC 1 hour prior to procedure. 30 g 3  . lisinopril (ZESTRIL) 40 MG tablet Take 1 tablet (40 mg total) by mouth daily for 30 days. 30 tablet 0  . metFORMIN (GLUCOPHAGE-XR) 500 MG 24 hr tablet Take 500 mg by mouth every evening.     . Misc Natural Products (TART CHERRY ADVANCED PO) Take 1 tablet by mouth daily.    . NON FORMULARY Take 2 tablets by mouth daily. Blood Sugar Defense     . ondansetron (ZOFRAN) 8 MG tablet Take 1 tablet (8 mg total) by mouth 2 (two) times daily as needed for refractory nausea / vomiting. Start on day 3 after cyclophosphamide chemotherapy. 30 tablet 1  .  potassium chloride (K-DUR) 10 MEQ tablet Take 10 mEq by mouth daily.     . Pyridoxine HCl (VITAMIN B-6) 250 MG tablet Take 1 tablet (250 mg total) by mouth daily. 30 tablet 6  . tamsulosin (FLOMAX) 0.4 MG CAPS capsule Take 0.4 mg by mouth daily.     No current facility-administered medications for this visit.    PAST MEDICAL HISTORY: Past Medical History:  Diagnosis Date  . Cancer ([place])    Leukemia   . Cervical myelopathy ([place])   . Chronic sinusitis   . CLL (chronic lymphocytic leukemia) ([place])    see records in care everywhere from [place]  . Coronary artery disease    minimal nonobstructive by 10/31/16 cath at [facility] in [place]  . Diabetes mellitus without complication ([place])   . Diffuse large B-cell lymphoma of lymph nodes of multiple regions ([place]) 10/31/2018  . Diverticulosis   . Erectile dysfunction   . Fracture    left ankle fracture with retained hardware  . GERD (gastroesophageal reflux disease)   . Goals of care, counseling/discussion 10/31/2018  . Gout   . Hyperlipemia   . Hypertension   . Lumbar radiculopathy   . Memory loss   . OSA (obstructive sleep apnea)   . Peripheral neuropathy   .  Renal cyst   . Sleep apnea    does not wear CPAP  . Thyroid nodule   . Wears dentures   . Wears glasses     PAST SURGICAL HISTORY: Past Surgical History:  Procedure Laterality Date  . ANKLE RECONSTRUCTION Left 12/19/2018   Procedure: LEFT ANKLE REVISION FIXATION, REMOVAL OF HARDWARE, OPEN TREATMENT, SYNDESMOSIS, POSSIBLE ANKLE ARTHROTOMY, DELTOID LIGAMENT REPAIR;  Surgeon: Erle Crocker, MD;  Location: [place];  Service: Orthopedics;  Laterality: Left;  . ANTERIOR CERVICAL DECOMP/DISCECTOMY FUSION N/A 04/20/2017   Procedure: ANTERIOR CERVICAL DECOMPRESSION FUSION, CERVICAL 3-4, CERVICAL 4-5 WITH INSTRUMENTATION AND ALLOGRAFT; REQUEST 3.5 HOURS;  Surgeon: Phylliss Bob, MD;  Location: [facility];  Service: Orthopedics;  Laterality: N/A;  ANTERIOR CERVICAL DECOMPRESSION FUSION,  CERVICAL 3-4, CERVICAL 4-5 WITH INSTRUMENTATION AND ALLOGRAFT; REQUEST 3.5 HOURS  . BACK SURGERY    . CARDIAC CATHETERIZATION     in Care Everywhere 11/01/16  . COLONOSCOPY    . IR IMAGING GUIDED PORT INSERTION  11/10/2018  . MULTIPLE TOOTH EXTRACTIONS    . ORIF ANKLE FRACTURE Left 09/13/2018   Procedure: OPEN REDUCTION INTERNAL FIXATION (ORIF) LATERAL LEFT ANKLE FRACTURE;  Surgeon: Dorna Leitz, MD;  Location: [facility];  Service: Orthopedics;  Laterality: Left;  . SYNDESMOSIS REPAIR Left 09/13/2018   Procedure: OPEN REDUCTION INTERNAL FIXATION SYNDESMOSIS REPAIR LEFT ANKLE;  Surgeon: Dorna Leitz, MD;  Location: [facility];  Service: Orthopedics;  Laterality: Left;    FAMILY HISTORY: Family History  Problem Relation Age of Onset  . Heart attack Mother   . Stroke Father   . Diabetes Sister   . Diabetes Brother     SOCIAL HISTORY: Social History   Socioeconomic History  . Marital status: Divorced    Spouse name: Not on file  . Number of children: 2  . Years of education: 5  . Highest education level: Not on file  Occupational History  . Occupation: Retired  Tobacco Use  . Smoking status: Former Smoker    Types: Pipe, Cigars, Cigarettes    Quit date: 10/31/1989    Years since quitting: 29.7  . Smokeless tobacco: Never Used  Substance and Sexual Activity  . Alcohol use: Yes    Comment: occasional  . Drug use: No  . Sexual activity: Not on file  Other Topics Concern  . Not on file  Social History Narrative   Lives with daughter.   Caffeine use: Drinks coffee daily (1-2 cups)   Right handed    Social Determinants of Health   Financial Resource Strain:   . Difficulty of Paying Living Expenses:   Food Insecurity:   . Worried About Charity fundraiser in the Last Year:   . Arboriculturist in the Last Year:   Transportation Needs:   . Film/video editor (Medical):   Marland Kitchen Lack of Transportation (Non-Medical):   Physical Activity:   . Days of Exercise per Week:   . Minutes of  Exercise per Session:   Stress:   . Feeling of Stress :   Social Connections:   . Frequency of Communication with Friends and Family:   . Frequency of Social Gatherings with Friends and Family:   . Attends Religious Services:   . Active Member of Clubs or Organizations:   . Attends Archivist Meetings:   Marland Kitchen Marital Status:   Intimate Partner Violence:   . Fear of Current or Ex-Partner:   . Emotionally Abused:   Marland Kitchen Physically Abused:   .  Sexually Abused:      PHYSICAL EXAM   Vitals:   08/07/19 1318  BP: (!) 141/83  Pulse: 97  Temp: (!) 96.8 F (36 C)  Weight: 281 lb (127.5 kg)  Height: 6\' 2"  (1.88 m)    Not recorded      Body mass index is 36.08 kg/m.  PHYSICAL EXAMNIATION:  Gen: NAD, conversant, well nourised, well groomed                     Cardiovascular: Regular rate rhythm, no peripheral edema, warm, nontender. Eyes: Conjunctivae clear without exudates or hemorrhage Neck: Supple, no carotid bruits. Pulmonary: Clear to auscultation bilaterally   NEUROLOGICAL EXAM:  MENTAL STATUS: Speech:    Speech is normal; fluent and spontaneous with normal comprehension.  Cognition:     Orientation to time, place and person     Normal recent and remote memory     Normal Attention span and concentration     Normal Language, naming, repeating,spontaneous speech     Fund of knowledge   CRANIAL NERVES: CN II: Visual fields are full to confrontation. Pupils are round equal and briskly reactive to light. CN III, IV, VI: extraocular movement are normal. No ptosis. CN V: Facial sensation is intact to light touch CN VII: Face is symmetric with normal eye closure  CN VIII: Hearing is normal to causal conversation. CN IX, X: Phonation is normal. CN XI: Head turning and shoulder shrug are intact  MOTOR: He has bilateral ankle dorsiflexion weakness, right side is moderate to severe, left side is mild to moderate, mild bilateral hip flexion  weakness  REFLEXES: Reflexes are hypoactive and symmetric at the biceps, triceps, knees, and absent at ankles. Plantar responses are flexor.  SENSORY: Length dependent decreased to light touch, pinprick, and vibratory sensation to ankle level   COORDINATION: Rapid alternating movements and fine finger movements are intact. There is no dysmetria on finger-to-nose and heel-knee-shin.    GAIT/STANCE: He needs pushed up to get up from seated position, dragging his bilateral lower extremity, across the floor, worsening on the right side, rely on his arm to hold onto his  walker   DIAGNOSTIC DATA (LABS, IMAGING, TESTING) - I reviewed patient records, labs, notes, testing and imaging myself where available.   ASSESSMENT AND PLAN  Ronald Rice is a 74 y.o. male    Gait abnormality Radiating pain of the right lower back to right lower extremity, history of lumbar decompression surgery History of cervical spondylitic myelopathy status post decompression November 2018 Worsening bilateral hands paresthesia,  His current complaints are continuation of previous difficulties in 2019, which has made worse by his current chemotherapy, radiation therapy, deconditioning, sedentary lifestyle,  His gait abnormality multifactorial, residual deficit from previous chronic lumbar radiculopathy, with persistent distal leg weakness, right worse than left, cervical spinal cord atrophy due to history of cervical spondylitic myelopathy, evidence of cervical cord signal change,  Laboratory evaluation to rule out treatable etiology,  Patient's and family desires further evaluation, will have a repeat EMG nerve conduction study  Refer him to Home physical therapy,  Encouraged him moderate exercise, he is a retired Airline pilot,                Ronald Rice, M.D. Ph.D.  [facility] [address], [place], [place] [address] Ph: [phone] Fax: [phone]  CC: Deland Pretty,  MD

## 2019-08-08 ENCOUNTER — Telehealth: Payer: Self-pay | Admitting: *Deleted

## 2019-08-08 LAB — TSH: TSH: 0.794 u[IU]/mL (ref 0.450–4.500)

## 2019-08-08 LAB — CK: Total CK: 127 U/L (ref 41–331)

## 2019-08-08 LAB — HEMOGLOBIN A1C
Est. average glucose Bld gHb Est-mCnc: 163 mg/dL
Hgb A1c MFr Bld: 7.3 % — ABNORMAL HIGH (ref 4.8–5.6)

## 2019-08-08 LAB — VITAMIN B12: Vitamin B-12: 538 pg/mL (ref 232–1245)

## 2019-08-08 NOTE — Telephone Encounter (Signed)
I spoke to the patient and informed him of the lab results. He plans to follow up w/ PCP about his elevated A1C.

## 2019-08-08 NOTE — Telephone Encounter (Signed)
-----   Message from Marcial Pacas, MD sent at 08/08/2019 11:33 AM EDT ----- Please call patient showed elevated A1c 7.3, consistent with his diagnosis of diabetes  Rest of the laboratory evaluations were normal.

## 2019-08-15 ENCOUNTER — Ambulatory Visit: Payer: Medicare HMO | Admitting: Neurology

## 2019-08-19 DIAGNOSIS — K5790 Diverticulosis of intestine, part unspecified, without perforation or abscess without bleeding: Secondary | ICD-10-CM | POA: Diagnosis not present

## 2019-08-19 DIAGNOSIS — G4733 Obstructive sleep apnea (adult) (pediatric): Secondary | ICD-10-CM | POA: Diagnosis not present

## 2019-08-19 DIAGNOSIS — E1142 Type 2 diabetes mellitus with diabetic polyneuropathy: Secondary | ICD-10-CM | POA: Diagnosis not present

## 2019-08-19 DIAGNOSIS — I10 Essential (primary) hypertension: Secondary | ICD-10-CM | POA: Diagnosis not present

## 2019-08-19 DIAGNOSIS — C9000 Multiple myeloma not having achieved remission: Secondary | ICD-10-CM | POA: Diagnosis not present

## 2019-08-19 DIAGNOSIS — M4712 Other spondylosis with myelopathy, cervical region: Secondary | ICD-10-CM | POA: Diagnosis not present

## 2019-08-19 DIAGNOSIS — M109 Gout, unspecified: Secondary | ICD-10-CM | POA: Diagnosis not present

## 2019-08-19 DIAGNOSIS — M5416 Radiculopathy, lumbar region: Secondary | ICD-10-CM | POA: Diagnosis not present

## 2019-08-19 DIAGNOSIS — I2510 Atherosclerotic heart disease of native coronary artery without angina pectoris: Secondary | ICD-10-CM | POA: Diagnosis not present

## 2019-08-22 ENCOUNTER — Other Ambulatory Visit: Payer: Self-pay

## 2019-08-22 DIAGNOSIS — I10 Essential (primary) hypertension: Secondary | ICD-10-CM | POA: Diagnosis not present

## 2019-08-22 DIAGNOSIS — M109 Gout, unspecified: Secondary | ICD-10-CM | POA: Diagnosis not present

## 2019-08-22 DIAGNOSIS — G4733 Obstructive sleep apnea (adult) (pediatric): Secondary | ICD-10-CM | POA: Diagnosis not present

## 2019-08-22 DIAGNOSIS — M4712 Other spondylosis with myelopathy, cervical region: Secondary | ICD-10-CM | POA: Diagnosis not present

## 2019-08-22 DIAGNOSIS — K5790 Diverticulosis of intestine, part unspecified, without perforation or abscess without bleeding: Secondary | ICD-10-CM | POA: Diagnosis not present

## 2019-08-22 DIAGNOSIS — I2510 Atherosclerotic heart disease of native coronary artery without angina pectoris: Secondary | ICD-10-CM | POA: Diagnosis not present

## 2019-08-22 DIAGNOSIS — C8338 Diffuse large B-cell lymphoma, lymph nodes of multiple sites: Secondary | ICD-10-CM

## 2019-08-22 DIAGNOSIS — E1142 Type 2 diabetes mellitus with diabetic polyneuropathy: Secondary | ICD-10-CM | POA: Diagnosis not present

## 2019-08-22 DIAGNOSIS — C9000 Multiple myeloma not having achieved remission: Secondary | ICD-10-CM | POA: Diagnosis not present

## 2019-08-22 DIAGNOSIS — M5416 Radiculopathy, lumbar region: Secondary | ICD-10-CM | POA: Diagnosis not present

## 2019-08-22 MED ORDER — TRAMADOL HCL 50 MG PO TABS: 50.0000 mg | ORAL_TABLET | Freq: Four times a day (QID) | ORAL | 0 refills | Status: DC | PRN

## 2019-08-23 DIAGNOSIS — M5416 Radiculopathy, lumbar region: Secondary | ICD-10-CM | POA: Diagnosis not present

## 2019-08-23 DIAGNOSIS — G4733 Obstructive sleep apnea (adult) (pediatric): Secondary | ICD-10-CM | POA: Diagnosis not present

## 2019-08-23 DIAGNOSIS — C9000 Multiple myeloma not having achieved remission: Secondary | ICD-10-CM | POA: Diagnosis not present

## 2019-08-23 DIAGNOSIS — I10 Essential (primary) hypertension: Secondary | ICD-10-CM | POA: Diagnosis not present

## 2019-08-23 DIAGNOSIS — M4712 Other spondylosis with myelopathy, cervical region: Secondary | ICD-10-CM | POA: Diagnosis not present

## 2019-08-23 DIAGNOSIS — M109 Gout, unspecified: Secondary | ICD-10-CM | POA: Diagnosis not present

## 2019-08-23 DIAGNOSIS — I2510 Atherosclerotic heart disease of native coronary artery without angina pectoris: Secondary | ICD-10-CM | POA: Diagnosis not present

## 2019-08-23 DIAGNOSIS — K5790 Diverticulosis of intestine, part unspecified, without perforation or abscess without bleeding: Secondary | ICD-10-CM | POA: Diagnosis not present

## 2019-08-23 DIAGNOSIS — E1142 Type 2 diabetes mellitus with diabetic polyneuropathy: Secondary | ICD-10-CM | POA: Diagnosis not present

## 2019-08-28 DIAGNOSIS — I2510 Atherosclerotic heart disease of native coronary artery without angina pectoris: Secondary | ICD-10-CM | POA: Diagnosis not present

## 2019-08-28 DIAGNOSIS — M109 Gout, unspecified: Secondary | ICD-10-CM | POA: Diagnosis not present

## 2019-08-28 DIAGNOSIS — I10 Essential (primary) hypertension: Secondary | ICD-10-CM | POA: Diagnosis not present

## 2019-08-28 DIAGNOSIS — M4712 Other spondylosis with myelopathy, cervical region: Secondary | ICD-10-CM | POA: Diagnosis not present

## 2019-08-28 DIAGNOSIS — C9000 Multiple myeloma not having achieved remission: Secondary | ICD-10-CM | POA: Diagnosis not present

## 2019-08-28 DIAGNOSIS — K5790 Diverticulosis of intestine, part unspecified, without perforation or abscess without bleeding: Secondary | ICD-10-CM | POA: Diagnosis not present

## 2019-08-28 DIAGNOSIS — G4733 Obstructive sleep apnea (adult) (pediatric): Secondary | ICD-10-CM | POA: Diagnosis not present

## 2019-08-28 DIAGNOSIS — M5416 Radiculopathy, lumbar region: Secondary | ICD-10-CM | POA: Diagnosis not present

## 2019-08-28 DIAGNOSIS — E1142 Type 2 diabetes mellitus with diabetic polyneuropathy: Secondary | ICD-10-CM | POA: Diagnosis not present

## 2019-08-29 DIAGNOSIS — I10 Essential (primary) hypertension: Secondary | ICD-10-CM | POA: Diagnosis not present

## 2019-08-29 DIAGNOSIS — R0982 Postnasal drip: Secondary | ICD-10-CM | POA: Diagnosis not present

## 2019-08-29 DIAGNOSIS — R600 Localized edema: Secondary | ICD-10-CM | POA: Diagnosis not present

## 2019-08-29 DIAGNOSIS — R350 Frequency of micturition: Secondary | ICD-10-CM | POA: Diagnosis not present

## 2019-08-29 DIAGNOSIS — E1140 Type 2 diabetes mellitus with diabetic neuropathy, unspecified: Secondary | ICD-10-CM | POA: Diagnosis not present

## 2019-08-29 DIAGNOSIS — C9590 Leukemia, unspecified not having achieved remission: Secondary | ICD-10-CM | POA: Diagnosis not present

## 2019-08-30 DIAGNOSIS — M7989 Other specified soft tissue disorders: Secondary | ICD-10-CM | POA: Diagnosis not present

## 2019-08-30 DIAGNOSIS — K219 Gastro-esophageal reflux disease without esophagitis: Secondary | ICD-10-CM | POA: Diagnosis not present

## 2019-08-30 DIAGNOSIS — M25472 Effusion, left ankle: Secondary | ICD-10-CM | POA: Diagnosis not present

## 2019-08-30 DIAGNOSIS — R6520 Severe sepsis without septic shock: Secondary | ICD-10-CM | POA: Diagnosis not present

## 2019-08-30 DIAGNOSIS — R4182 Altered mental status, unspecified: Secondary | ICD-10-CM | POA: Diagnosis not present

## 2019-08-30 DIAGNOSIS — Z20822 Contact with and (suspected) exposure to covid-19: Secondary | ICD-10-CM | POA: Diagnosis not present

## 2019-08-30 DIAGNOSIS — E1142 Type 2 diabetes mellitus with diabetic polyneuropathy: Secondary | ICD-10-CM | POA: Diagnosis not present

## 2019-08-30 DIAGNOSIS — A419 Sepsis, unspecified organism: Secondary | ICD-10-CM | POA: Diagnosis not present

## 2019-08-30 DIAGNOSIS — R6510 Systemic inflammatory response syndrome (SIRS) of non-infectious origin without acute organ dysfunction: Secondary | ICD-10-CM | POA: Diagnosis not present

## 2019-08-30 DIAGNOSIS — G9341 Metabolic encephalopathy: Secondary | ICD-10-CM | POA: Diagnosis not present

## 2019-08-30 DIAGNOSIS — B9620 Unspecified Escherichia coli [E. coli] as the cause of diseases classified elsewhere: Secondary | ICD-10-CM | POA: Diagnosis not present

## 2019-08-30 DIAGNOSIS — R0689 Other abnormalities of breathing: Secondary | ICD-10-CM | POA: Diagnosis not present

## 2019-08-30 DIAGNOSIS — N390 Urinary tract infection, site not specified: Secondary | ICD-10-CM | POA: Diagnosis not present

## 2019-08-30 DIAGNOSIS — R509 Fever, unspecified: Secondary | ICD-10-CM | POA: Diagnosis not present

## 2019-08-30 DIAGNOSIS — E1165 Type 2 diabetes mellitus with hyperglycemia: Secondary | ICD-10-CM | POA: Diagnosis not present

## 2019-08-30 DIAGNOSIS — E785 Hyperlipidemia, unspecified: Secondary | ICD-10-CM | POA: Diagnosis not present

## 2019-08-30 DIAGNOSIS — R319 Hematuria, unspecified: Secondary | ICD-10-CM | POA: Diagnosis not present

## 2019-08-30 DIAGNOSIS — N281 Cyst of kidney, acquired: Secondary | ICD-10-CM | POA: Diagnosis not present

## 2019-08-30 DIAGNOSIS — R410 Disorientation, unspecified: Secondary | ICD-10-CM | POA: Diagnosis not present

## 2019-08-30 DIAGNOSIS — D649 Anemia, unspecified: Secondary | ICD-10-CM | POA: Diagnosis not present

## 2019-08-30 DIAGNOSIS — M25572 Pain in left ankle and joints of left foot: Secondary | ICD-10-CM | POA: Diagnosis not present

## 2019-08-30 DIAGNOSIS — I10 Essential (primary) hypertension: Secondary | ICD-10-CM | POA: Diagnosis not present

## 2019-08-30 DIAGNOSIS — S82832A Other fracture of upper and lower end of left fibula, initial encounter for closed fracture: Secondary | ICD-10-CM | POA: Diagnosis not present

## 2019-08-30 DIAGNOSIS — R000 Tachycardia, unspecified: Secondary | ICD-10-CM | POA: Diagnosis not present

## 2019-08-30 DIAGNOSIS — Q619 Cystic kidney disease, unspecified: Secondary | ICD-10-CM | POA: Diagnosis not present

## 2019-08-30 DIAGNOSIS — E1169 Type 2 diabetes mellitus with other specified complication: Secondary | ICD-10-CM | POA: Diagnosis not present

## 2019-08-30 DIAGNOSIS — I152 Hypertension secondary to endocrine disorders: Secondary | ICD-10-CM | POA: Diagnosis not present

## 2019-08-30 DIAGNOSIS — E039 Hypothyroidism, unspecified: Secondary | ICD-10-CM | POA: Diagnosis not present

## 2019-08-30 DIAGNOSIS — R0902 Hypoxemia: Secondary | ICD-10-CM | POA: Diagnosis not present

## 2019-08-31 DIAGNOSIS — N281 Cyst of kidney, acquired: Secondary | ICD-10-CM | POA: Diagnosis not present

## 2019-08-31 MED ORDER — TAMSULOSIN HCL 0.4 MG PO CAPS
0.40 | ORAL_CAPSULE | ORAL | Status: DC
Start: 2019-09-04 — End: 2019-08-31

## 2019-08-31 MED ORDER — SENNOSIDES-DOCUSATE SODIUM 8.6-50 MG PO TABS
1.00 | ORAL_TABLET | ORAL | Status: DC
Start: ? — End: 2019-08-31

## 2019-08-31 MED ORDER — ACETAMINOPHEN 325 MG PO TABS
650.00 | ORAL_TABLET | ORAL | Status: DC
Start: ? — End: 2019-08-31

## 2019-08-31 MED ORDER — ALLOPURINOL 300 MG PO TABS
300.00 | ORAL_TABLET | ORAL | Status: DC
Start: 2019-09-04 — End: 2019-08-31

## 2019-08-31 MED ORDER — POLYVINYL ALCOHOL 1.4 % OP SOLN
1.00 | OPHTHALMIC | Status: DC
Start: ? — End: 2019-08-31

## 2019-08-31 MED ORDER — ALBUTEROL SULFATE HFA 108 (90 BASE) MCG/ACT IN AERS
2.00 | INHALATION_SPRAY | RESPIRATORY_TRACT | Status: DC
Start: ? — End: 2019-08-31

## 2019-08-31 MED ORDER — ATORVASTATIN CALCIUM 40 MG PO TABS
40.00 | ORAL_TABLET | ORAL | Status: DC
Start: 2019-09-04 — End: 2019-08-31

## 2019-08-31 MED ORDER — GLUCAGON (RDNA) 1 MG IJ KIT
1.00 | PACK | INTRAMUSCULAR | Status: DC
Start: ? — End: 2019-08-31

## 2019-08-31 MED ORDER — GLUCOSE 40 % PO GEL
15.00 | ORAL | Status: DC
Start: ? — End: 2019-08-31

## 2019-08-31 MED ORDER — ASPIRIN 81 MG PO TBEC
81.00 | DELAYED_RELEASE_TABLET | ORAL | Status: DC
Start: 2019-09-04 — End: 2019-08-31

## 2019-08-31 MED ORDER — FAMOTIDINE 20 MG PO TABS
20.00 | ORAL_TABLET | ORAL | Status: DC
Start: ? — End: 2019-08-31

## 2019-08-31 MED ORDER — DEXTROSE 10 % IV SOLN
125.00 | INTRAVENOUS | Status: DC
Start: ? — End: 2019-08-31

## 2019-08-31 MED ORDER — METOPROLOL SUCCINATE ER 50 MG PO TB24
50.00 | ORAL_TABLET | ORAL | Status: DC
Start: 2019-09-04 — End: 2019-08-31

## 2019-08-31 MED ORDER — ONDANSETRON HCL 4 MG/2ML IJ SOLN
4.00 | INTRAMUSCULAR | Status: DC
Start: ? — End: 2019-08-31

## 2019-08-31 MED ORDER — ACETAMINOPHEN 650 MG RE SUPP
650.00 | RECTAL | Status: DC
Start: ? — End: 2019-08-31

## 2019-08-31 MED ORDER — CYANOCOBALAMIN 1000 MCG PO TABS
1000.00 | ORAL_TABLET | ORAL | Status: DC
Start: 2019-09-04 — End: 2019-08-31

## 2019-08-31 MED ORDER — ENOXAPARIN SODIUM 40 MG/0.4ML ~~LOC~~ SOLN
40.00 | SUBCUTANEOUS | Status: DC
Start: 2019-09-03 — End: 2019-08-31

## 2019-08-31 MED ORDER — LISINOPRIL 20 MG PO TABS
40.00 | ORAL_TABLET | ORAL | Status: DC
Start: 2019-09-04 — End: 2019-08-31

## 2019-08-31 MED ORDER — GENERIC EXTERNAL MEDICATION
3.38 | Status: DC
Start: 2019-08-31 — End: 2019-08-31

## 2019-08-31 MED ORDER — SODIUM CHLORIDE 0.9 % IV SOLN
INTRAVENOUS | Status: DC
Start: ? — End: 2019-08-31

## 2019-08-31 MED ORDER — OXYCODONE HCL 5 MG PO TABS
5.00 | ORAL_TABLET | ORAL | Status: DC
Start: ? — End: 2019-08-31

## 2019-08-31 MED ORDER — INSULIN LISPRO 100 UNIT/ML ~~LOC~~ SOLN
2.00 | SUBCUTANEOUS | Status: DC
Start: 2019-09-03 — End: 2019-08-31

## 2019-09-01 DIAGNOSIS — N281 Cyst of kidney, acquired: Secondary | ICD-10-CM | POA: Diagnosis not present

## 2019-09-02 DIAGNOSIS — N281 Cyst of kidney, acquired: Secondary | ICD-10-CM | POA: Diagnosis not present

## 2019-09-03 ENCOUNTER — Ambulatory Visit: Admission: RE | Admit: 2019-09-03 | Payer: Medicare HMO | Source: Ambulatory Visit | Admitting: Radiation Oncology

## 2019-09-03 ENCOUNTER — Telehealth: Payer: Self-pay | Admitting: *Deleted

## 2019-09-03 DIAGNOSIS — N281 Cyst of kidney, acquired: Secondary | ICD-10-CM | POA: Diagnosis not present

## 2019-09-03 MED ORDER — GENERIC EXTERNAL MEDICATION
2.00 | Status: DC
Start: 2019-09-03 — End: 2019-09-03

## 2019-09-03 NOTE — Telephone Encounter (Signed)
Message received from patient's wife stating that patient has been having difficulty with numbness to his hands and holding utensils and would like to know if there is anything pt can take to help this per Dr. Marin Olp.  Call placed back to pt.'s wife to see if pt is currently taking Neurontin per order of Dr. Marin Olp.  Pt.'s wife states that she will check pt's med list when she gets home d/t pt is being discharged home from the hospital today and she is picking him up at this time.  Pt.'s wife made aware of appt for tomorrow, 09/04/19 and will call office back if pt is able to make appt.

## 2019-09-04 ENCOUNTER — Inpatient Hospital Stay: Payer: Medicare HMO | Admitting: Hematology & Oncology

## 2019-09-04 ENCOUNTER — Inpatient Hospital Stay: Payer: Medicare HMO

## 2019-09-04 DIAGNOSIS — K5790 Diverticulosis of intestine, part unspecified, without perforation or abscess without bleeding: Secondary | ICD-10-CM | POA: Diagnosis not present

## 2019-09-04 DIAGNOSIS — M5416 Radiculopathy, lumbar region: Secondary | ICD-10-CM | POA: Diagnosis not present

## 2019-09-04 DIAGNOSIS — I10 Essential (primary) hypertension: Secondary | ICD-10-CM | POA: Diagnosis not present

## 2019-09-04 DIAGNOSIS — E1142 Type 2 diabetes mellitus with diabetic polyneuropathy: Secondary | ICD-10-CM | POA: Diagnosis not present

## 2019-09-04 DIAGNOSIS — M4712 Other spondylosis with myelopathy, cervical region: Secondary | ICD-10-CM | POA: Diagnosis not present

## 2019-09-04 DIAGNOSIS — C9000 Multiple myeloma not having achieved remission: Secondary | ICD-10-CM | POA: Diagnosis not present

## 2019-09-04 DIAGNOSIS — M109 Gout, unspecified: Secondary | ICD-10-CM | POA: Diagnosis not present

## 2019-09-04 DIAGNOSIS — I2510 Atherosclerotic heart disease of native coronary artery without angina pectoris: Secondary | ICD-10-CM | POA: Diagnosis not present

## 2019-09-04 DIAGNOSIS — G4733 Obstructive sleep apnea (adult) (pediatric): Secondary | ICD-10-CM | POA: Diagnosis not present

## 2019-09-05 ENCOUNTER — Encounter: Payer: Medicare HMO | Admitting: Neurology

## 2019-09-05 DIAGNOSIS — G4733 Obstructive sleep apnea (adult) (pediatric): Secondary | ICD-10-CM | POA: Diagnosis not present

## 2019-09-05 DIAGNOSIS — I2510 Atherosclerotic heart disease of native coronary artery without angina pectoris: Secondary | ICD-10-CM | POA: Diagnosis not present

## 2019-09-05 DIAGNOSIS — M109 Gout, unspecified: Secondary | ICD-10-CM | POA: Diagnosis not present

## 2019-09-05 DIAGNOSIS — E1142 Type 2 diabetes mellitus with diabetic polyneuropathy: Secondary | ICD-10-CM | POA: Diagnosis not present

## 2019-09-05 DIAGNOSIS — I10 Essential (primary) hypertension: Secondary | ICD-10-CM | POA: Diagnosis not present

## 2019-09-05 DIAGNOSIS — C9000 Multiple myeloma not having achieved remission: Secondary | ICD-10-CM | POA: Diagnosis not present

## 2019-09-05 DIAGNOSIS — K5790 Diverticulosis of intestine, part unspecified, without perforation or abscess without bleeding: Secondary | ICD-10-CM | POA: Diagnosis not present

## 2019-09-05 DIAGNOSIS — M5416 Radiculopathy, lumbar region: Secondary | ICD-10-CM | POA: Diagnosis not present

## 2019-09-05 DIAGNOSIS — M4712 Other spondylosis with myelopathy, cervical region: Secondary | ICD-10-CM | POA: Diagnosis not present

## 2019-09-06 DIAGNOSIS — C9000 Multiple myeloma not having achieved remission: Secondary | ICD-10-CM | POA: Diagnosis not present

## 2019-09-06 DIAGNOSIS — E1142 Type 2 diabetes mellitus with diabetic polyneuropathy: Secondary | ICD-10-CM | POA: Diagnosis not present

## 2019-09-06 DIAGNOSIS — K5790 Diverticulosis of intestine, part unspecified, without perforation or abscess without bleeding: Secondary | ICD-10-CM | POA: Diagnosis not present

## 2019-09-06 DIAGNOSIS — I10 Essential (primary) hypertension: Secondary | ICD-10-CM | POA: Diagnosis not present

## 2019-09-06 DIAGNOSIS — M109 Gout, unspecified: Secondary | ICD-10-CM | POA: Diagnosis not present

## 2019-09-06 DIAGNOSIS — M5416 Radiculopathy, lumbar region: Secondary | ICD-10-CM | POA: Diagnosis not present

## 2019-09-06 DIAGNOSIS — G4733 Obstructive sleep apnea (adult) (pediatric): Secondary | ICD-10-CM | POA: Diagnosis not present

## 2019-09-06 DIAGNOSIS — I2510 Atherosclerotic heart disease of native coronary artery without angina pectoris: Secondary | ICD-10-CM | POA: Diagnosis not present

## 2019-09-06 DIAGNOSIS — M4712 Other spondylosis with myelopathy, cervical region: Secondary | ICD-10-CM | POA: Diagnosis not present

## 2019-09-10 DIAGNOSIS — M5416 Radiculopathy, lumbar region: Secondary | ICD-10-CM | POA: Diagnosis not present

## 2019-09-10 DIAGNOSIS — C9000 Multiple myeloma not having achieved remission: Secondary | ICD-10-CM | POA: Diagnosis not present

## 2019-09-10 DIAGNOSIS — K5790 Diverticulosis of intestine, part unspecified, without perforation or abscess without bleeding: Secondary | ICD-10-CM | POA: Diagnosis not present

## 2019-09-10 DIAGNOSIS — M109 Gout, unspecified: Secondary | ICD-10-CM | POA: Diagnosis not present

## 2019-09-10 DIAGNOSIS — I10 Essential (primary) hypertension: Secondary | ICD-10-CM | POA: Diagnosis not present

## 2019-09-10 DIAGNOSIS — E1142 Type 2 diabetes mellitus with diabetic polyneuropathy: Secondary | ICD-10-CM | POA: Diagnosis not present

## 2019-09-10 DIAGNOSIS — G4733 Obstructive sleep apnea (adult) (pediatric): Secondary | ICD-10-CM | POA: Diagnosis not present

## 2019-09-10 DIAGNOSIS — I2510 Atherosclerotic heart disease of native coronary artery without angina pectoris: Secondary | ICD-10-CM | POA: Diagnosis not present

## 2019-09-10 DIAGNOSIS — M4712 Other spondylosis with myelopathy, cervical region: Secondary | ICD-10-CM | POA: Diagnosis not present

## 2019-09-12 NOTE — Progress Notes (Signed)
  Patient Name: Ronald Rice MRN: [id number] DOB: 1946-03-03 Referring Physician: Deland Pretty (Profile Not Attached) Date of Service: 08/01/2019 [facility], [place]                                                        End Of Treatment Note  Diagnoses: C83.38-Diffuse large b-cell lymphoma, lymph nodes of multiple sites  Cancer Staging: Diffuse large cell non-Hodgkin's lymphoma-Richter's transformation from CLL  Intent: Curative  Radiation Treatment Dates: 06/27/2019 through 08/01/2019 Site Technique Total Dose (Gy) Dose per Fx (Gy) Completed Fx Beam Energies  Neck Left: HN_Lt 3D 45/45 1.8 25/25 6X, 10X   Narrative: The patient tolerated radiation therapy relatively well. He did report some mild to moderate fatigue, sore throat on the left side, and soreness to left side of jaw. He denied mouth pain, thick saliva, difficulty swallowing, and poor appetite. The patient's palpable lymph node was noted to be slightly smaller as treatment continued, but there were some slight hyperpigmentation changes to the left neck.    Plan: The patient will follow-up with radiation oncology in one month.  ________________________________________________   Blair Promise, PhD, MD  This document serves as a record of services personally performed by Gery Pray, MD. It was created on his behalf by Clerance Lav, a trained medical scribe. The creation of this record is based on the scribe's personal observations and the provider's statements to them. This document has been checked and approved by the attending provider.

## 2019-09-13 ENCOUNTER — Inpatient Hospital Stay: Payer: Medicare HMO | Attending: Hematology & Oncology

## 2019-09-13 ENCOUNTER — Inpatient Hospital Stay: Payer: Medicare HMO

## 2019-09-13 ENCOUNTER — Inpatient Hospital Stay (HOSPITAL_BASED_OUTPATIENT_CLINIC_OR_DEPARTMENT_OTHER): Payer: Medicare HMO | Admitting: Hematology & Oncology

## 2019-09-13 ENCOUNTER — Other Ambulatory Visit: Payer: Self-pay

## 2019-09-13 ENCOUNTER — Encounter: Payer: Self-pay | Admitting: Hematology & Oncology

## 2019-09-13 DIAGNOSIS — C9110 Chronic lymphocytic leukemia of B-cell type not having achieved remission: Secondary | ICD-10-CM | POA: Diagnosis not present

## 2019-09-13 DIAGNOSIS — E119 Type 2 diabetes mellitus without complications: Secondary | ICD-10-CM | POA: Diagnosis not present

## 2019-09-13 DIAGNOSIS — C8338 Diffuse large B-cell lymphoma, lymph nodes of multiple sites: Secondary | ICD-10-CM | POA: Diagnosis not present

## 2019-09-13 DIAGNOSIS — E1165 Type 2 diabetes mellitus with hyperglycemia: Secondary | ICD-10-CM | POA: Insufficient documentation

## 2019-09-13 LAB — CBC WITH DIFFERENTIAL (CANCER CENTER ONLY)
Abs Immature Granulocytes: 0.01 10*3/uL (ref 0.00–0.07)
Basophils Absolute: 0 10*3/uL (ref 0.0–0.1)
Basophils Relative: 1 %
Eosinophils Absolute: 0.1 10*3/uL (ref 0.0–0.5)
Eosinophils Relative: 3 %
HCT: 38.6 % — ABNORMAL LOW (ref 39.0–52.0)
Hemoglobin: 13.8 g/dL (ref 13.0–17.0)
Immature Granulocytes: 0 %
Lymphocytes Relative: 27 %
Lymphs Abs: 1.1 10*3/uL (ref 0.7–4.0)
MCH: 30.1 pg (ref 26.0–34.0)
MCHC: 35.8 g/dL (ref 30.0–36.0)
MCV: 84.3 fL (ref 80.0–100.0)
Monocytes Absolute: 0.5 10*3/uL (ref 0.1–1.0)
Monocytes Relative: 13 %
Neutro Abs: 2.2 10*3/uL (ref 1.7–7.7)
Neutrophils Relative %: 56 %
Platelet Count: 249 10*3/uL (ref 150–400)
RBC: 4.58 MIL/uL (ref 4.22–5.81)
RDW: 13.2 % (ref 11.5–15.5)
WBC Count: 3.9 10*3/uL — ABNORMAL LOW (ref 4.0–10.5)
nRBC: 0 % (ref 0.0–0.2)

## 2019-09-13 LAB — CMP (CANCER CENTER ONLY)
ALT: 23 U/L (ref 0–44)
AST: 17 U/L (ref 15–41)
Albumin: 4.1 g/dL (ref 3.5–5.0)
Alkaline Phosphatase: 67 U/L (ref 38–126)
Anion gap: 9 (ref 5–15)
BUN: 11 mg/dL (ref 8–23)
CO2: 28 mmol/L (ref 22–32)
Calcium: 9.3 mg/dL (ref 8.9–10.3)
Chloride: 104 mmol/L (ref 98–111)
Creatinine: 1.16 mg/dL (ref 0.61–1.24)
GFR, Est AFR Am: >60 mL/min (ref >60)
GFR, Estimated: >60 mL/min (ref >60)
Glucose, Bld: 185 mg/dL — ABNORMAL HIGH (ref 70–99)
Potassium: 4 mmol/L (ref 3.5–5.1)
Sodium: 141 mmol/L (ref 135–145)
Total Bilirubin: 0.5 mg/dL (ref 0.3–1.2)
Total Protein: 6.4 g/dL — ABNORMAL LOW (ref 6.5–8.1)

## 2019-09-13 LAB — LACTATE DEHYDROGENASE: LDH: 163 U/L (ref 98–192)

## 2019-09-13 MED ORDER — DULOXETINE HCL 60 MG PO CPEP: 60.0000 mg | ORAL_CAPSULE | Freq: Every day | ORAL | 3 refills | Status: DC

## 2019-09-13 NOTE — Patient Instructions (Signed)

## 2019-09-13 NOTE — Progress Notes (Signed)
Hematology and Oncology Follow Up Visit  Mathais Runkles [id number] September 04, 1945 74 y.o. 09/13/2019   Principle Diagnosis:   Diffuse large cell non-Hodgkin's lymphoma-Richter's transformation from CLL  Current Therapy:    R-CHOP-s/p cycle #8-- started on 11/08/2018  XRT to the LEFT neck -- 4500 rad -- completed on 08/01/2019     Interim History:  Mr. Coovert is back for for follow-up.  Thankfully, his wife comes in with him.  Is been a long time since we have seen her.  She is not been able to come in because of the coronavirus.  He does look quite good.  His main problem has been the neuropathy.  I am sure that the chemotherapy is some to do with it.  However I also feel that his diabetes, which is poorly controlled, is also a factor.  He has had no problems with pain.  He does not have a boot on his foot anymore.  This clearly has to be helpful.  He has had no fever.  He has had no cough.  He has had no bleeding.  There is been no change in bowel or bladder habits.  He really does not take much for diabetes.  I think his wife says he takes Metformin.  He has had no headache.  We will plan for a follow-up PET scan probably in mid May.  Overall, his performance status is ECOG 1.     Medications:  Current Outpatient Medications:  .  acetaminophen (TYLENOL) 500 MG tablet, Take 1,000 mg by mouth every 6 (six) hours as needed (pain). , Disp: , Rfl:  .  allopurinol (ZYLOPRIM) 300 MG tablet, Take 300 mg by mouth daily., Disp: , Rfl:  .  aspirin EC 81 MG tablet, Take 81 mg by mouth daily., Disp: , Rfl:  .  atenolol (TENORMIN) 25 MG tablet, Take 25 mg by mouth daily. , Disp: , Rfl:  .  atorvastatin (LIPITOR) 40 MG tablet, Take 40 mg by mouth daily., Disp: , Rfl:  .  Cholecalciferol (VITAMIN D3 PO), Take 1 tablet by mouth daily., Disp: , Rfl:  .  CINNAMON PO, Take 1 tablet by mouth daily., Disp: , Rfl:  .  diclofenac sodium (VOLTAREN) 1 % GEL, Apply 1 application topically as needed (pain).  , Disp: , Rfl:  .  donepezil (ARICEPT ODT) 5 MG disintegrating tablet, Take 5 mg by mouth at bedtime., Disp: , Rfl:  .  DULoxetine (CYMBALTA) 30 MG capsule, TAKE 1 CAPSULE(30 MG) BY MOUTH DAILY, Disp: 30 capsule, Rfl: 3 .  famotidine (PEPCID) 20 MG tablet, Take 20 mg by mouth at bedtime as needed for heartburn. , Disp: , Rfl:  .  hydrochlorothiazide (HYDRODIURIL) 25 MG tablet, Take 25 mg by mouth daily. , Disp: , Rfl:  .  lidocaine-prilocaine (EMLA) cream, Apply to PAC 1 hour prior to procedure., Disp: 30 g, Rfl: 3 .  lisinopril (ZESTRIL) 40 MG tablet, Take 1 tablet (40 mg total) by mouth daily for 30 days., Disp: 30 tablet, Rfl: 0 .  metFORMIN (GLUCOPHAGE-XR) 500 MG 24 hr tablet, Take 500 mg by mouth every evening. , Disp: , Rfl:  .  Misc Natural Products (TART CHERRY ADVANCED PO), Take 1 tablet by mouth daily., Disp: , Rfl:  .  NON FORMULARY, Take 2 tablets by mouth daily. Blood Sugar Defense , Disp: , Rfl:  .  ondansetron (ZOFRAN) 8 MG tablet, Take 1 tablet (8 mg total) by mouth 2 (two) times daily as needed for refractory nausea /  vomiting. Start on day 3 after cyclophosphamide chemotherapy., Disp: 30 tablet, Rfl: 1 .  potassium chloride (K-DUR) 10 MEQ tablet, Take 10 mEq by mouth daily. , Disp: , Rfl:  .  Pyridoxine HCl (VITAMIN B-6) 250 MG tablet, Take 1 tablet (250 mg total) by mouth daily., Disp: 30 tablet, Rfl: 6 .  tamsulosin (FLOMAX) 0.4 MG CAPS capsule, Take 0.4 mg by mouth daily., Disp: , Rfl:  .  traMADol (ULTRAM) 50 MG tablet, Take 1 tablet (50 mg total) by mouth every 6 (six) hours as needed., Disp: 90 tablet, Rfl: 0  Allergies:  No Known Allergies  Past Medical History, Surgical history, Social history, and Family History were reviewed and updated.  Review of Systems: Review of Systems  Constitutional: Negative.   HENT:  Negative.   Eyes: Negative.   Respiratory: Negative.   Cardiovascular: Negative.   Gastrointestinal: Negative.   Endocrine: Negative.   Genitourinary:  Positive for nocturia.   Musculoskeletal: Positive for arthralgias, gait problem and myalgias.  Neurological: Positive for gait problem.  Hematological: Negative.   Psychiatric/Behavioral: Negative.     Physical Exam:  vitals were not taken for this visit.   Wt Readings from Last 3 Encounters:  08/07/19 281 lb (127.5 kg)  06/20/19 283 lb 8 oz (128.6 kg)  06/12/19 273 lb (123.8 kg)    Physical Exam Vitals reviewed.  HENT:     Head: Normocephalic and atraumatic.  Eyes:     Pupils: Pupils are equal, round, and reactive to light.  Neck:     Comments: Examination of his neck shows marked improvement in the adenopathy in the left neck.  He still has some palpable posterior cervical lymphadenopathy.  It probably measures about 2 x 2 cm.  It is not as firm.  It is somewhat mobile.. Cardiovascular:     Rate and Rhythm: Normal rate and regular rhythm.     Heart sounds: Normal heart sounds.  Pulmonary:     Effort: Pulmonary effort is normal.     Breath sounds: Normal breath sounds.  Abdominal:     General: Bowel sounds are normal.     Palpations: Abdomen is soft.  Musculoskeletal:        General: No tenderness or deformity. Normal range of motion.     Comments: He has a cast on the right foot and lower leg.  This goes about two thirds the way up his right leg.  Left leg is unremarkable.  He has no swelling.  He has decent pulses.  He has good range of motion of his joints.  Lymphadenopathy:     Cervical: No cervical adenopathy.  Skin:    General: Skin is warm and dry.     Findings: No erythema or rash.  Neurological:     Mental Status: He is alert and oriented to person, place, and time.  Psychiatric:        Behavior: Behavior normal.        Thought Content: Thought content normal.        Judgment: Judgment normal.      Lab Results  Component Value Date   WBC 3.9 (L) 09/13/2019   HGB 13.8 09/13/2019   HCT 38.6 (L) 09/13/2019   MCV 84.3 09/13/2019   PLT 249 09/13/2019      Chemistry      Component Value Date/Time   NA 141 09/13/2019 0820   NA 142 01/25/2017 1217   K 4.0 09/13/2019 0820   K 4.1 01/25/2017 1217  CL 104 09/13/2019 0820   CL 102 01/25/2017 1217   CO2 28 09/13/2019 0820   CO2 33 01/25/2017 1217   BUN 11 09/13/2019 0820   BUN 12 01/25/2017 1217   CREATININE 1.16 09/13/2019 0820   CREATININE 1.4 (H) 01/25/2017 1217      Component Value Date/Time   CALCIUM 9.3 09/13/2019 0820   CALCIUM 9.7 01/25/2017 1217   ALKPHOS 67 09/13/2019 0820   ALKPHOS 59 01/25/2017 1217   AST 17 09/13/2019 0820   ALT 23 09/13/2019 0820   ALT 32 01/25/2017 1217   BILITOT 0.5 09/13/2019 0820       Impression and Plan: Mr. Steever is a 74 year old African-American male.  He was diagnosed with CLL back in [place].  I I am optimistic that he will be in remission.  Again, we will do a PET scan on him.  With his neuropathy, we will try and Cymbalta.  He will had been on gabapentin.  However, he says he cannot remember to take this 3 or 4 times a day.  He does have the Port-A-Cath.  I think this was flushed today.  We will plan to get him back in late May after his PET scan is done.  Hopefully, he will have seen his family doctor and get his diabetes under better control.    Volanda Napoleon, MD 4/22/20218:51 AM

## 2019-09-14 ENCOUNTER — Other Ambulatory Visit: Payer: Medicare HMO

## 2019-09-14 ENCOUNTER — Ambulatory Visit: Payer: Medicare HMO | Admitting: Hematology & Oncology

## 2019-09-14 DIAGNOSIS — I10 Essential (primary) hypertension: Secondary | ICD-10-CM | POA: Diagnosis not present

## 2019-09-14 DIAGNOSIS — M109 Gout, unspecified: Secondary | ICD-10-CM | POA: Diagnosis not present

## 2019-09-14 DIAGNOSIS — M5416 Radiculopathy, lumbar region: Secondary | ICD-10-CM | POA: Diagnosis not present

## 2019-09-14 DIAGNOSIS — G4733 Obstructive sleep apnea (adult) (pediatric): Secondary | ICD-10-CM | POA: Diagnosis not present

## 2019-09-14 DIAGNOSIS — K5790 Diverticulosis of intestine, part unspecified, without perforation or abscess without bleeding: Secondary | ICD-10-CM | POA: Diagnosis not present

## 2019-09-14 DIAGNOSIS — I2510 Atherosclerotic heart disease of native coronary artery without angina pectoris: Secondary | ICD-10-CM | POA: Diagnosis not present

## 2019-09-14 DIAGNOSIS — C9000 Multiple myeloma not having achieved remission: Secondary | ICD-10-CM | POA: Diagnosis not present

## 2019-09-14 DIAGNOSIS — M4712 Other spondylosis with myelopathy, cervical region: Secondary | ICD-10-CM | POA: Diagnosis not present

## 2019-09-14 DIAGNOSIS — E1142 Type 2 diabetes mellitus with diabetic polyneuropathy: Secondary | ICD-10-CM | POA: Diagnosis not present

## 2019-09-17 ENCOUNTER — Other Ambulatory Visit: Payer: Self-pay

## 2019-09-17 ENCOUNTER — Encounter: Payer: Self-pay | Admitting: Neurology

## 2019-09-17 ENCOUNTER — Ambulatory Visit: Payer: Medicare HMO | Admitting: Neurology

## 2019-09-17 VITALS — BP 139/86 | HR 92 | Temp 97.8°F | Ht 74.0 in | Wt 282.0 lb

## 2019-09-17 DIAGNOSIS — G4730 Sleep apnea, unspecified: Secondary | ICD-10-CM

## 2019-09-17 DIAGNOSIS — G4731 Primary central sleep apnea: Secondary | ICD-10-CM | POA: Diagnosis not present

## 2019-09-17 DIAGNOSIS — R351 Nocturia: Secondary | ICD-10-CM | POA: Diagnosis not present

## 2019-09-17 DIAGNOSIS — G4719 Other hypersomnia: Secondary | ICD-10-CM

## 2019-09-17 NOTE — Progress Notes (Signed)
SLEEP MEDICINE CLINIC   Provider:  Larey Seat, M D  Primary Care Physician:  Deland Pretty, MD   Referring Provider: Deland Pretty, MD / Lewayne Bunting , MD    Chief Complaint  Patient presents with  . Follow-up    pt with wife, rm 101. pt presents today because he is not treating his OSA. 06/2018 was Last SS and he had severe sleep apnea at that time and was started on CPAP. patient was unable to tolerate the pressure of the CPAP and it was lowered to try and help but still was unable. he never gave it more then a week to try. pt wife states that she has tried to explain importance of him needing to use it. she is hoping that we can attempt the process again so that he can effectively treat his apnea    HPI:  Ronald Rice is a 74 y.o. male patient of Dr. Krista Blue and Deland Pretty, MD , and was seen here for sleep evaluation on 06-28-2018 upon referral from Dr. Shelia Media. He now returns after he couldn't tolerate CPAP . His wife is here with him. He has undergone a sleep study on 07-17-2019: The sleep study showed a very high apnea-hypopnea index of 71 an hour.  The patient had actually worse apnea in non-REM sleep than in rem sleep.  The apnea was slightly accentuated with in supine position.  His wife reports that whenever he is on his back he snores loud and she notices no pauses in his breathing.  Let me just recap here that the patient had 232 respiratory events 30 classic obstructive apneas 55 central apneas 28 mixed apneas this is a very complex and more difficult to treat apnea but may not respond to simple CPAP. He couldn't get in for a PAP titration ,and due to the pandemic ended up , frustratingly - on auto CPAP.  His energy level is low, his wife feels he sleeps poorly, he is restless. She witnessed a lot of long apneas.       06-28-2018:  Ronald Rice, who is seen here accompanied by his wife.  The patient had been in and out of the hospital recently, he suffers also from chronic myelogenous  leukemia, currently in remission.  His oncologist is Dr. Burney Gauze.  He had multiple back and neck surgeries and carries also a diagnosis of hypertension, diabetes and hypercholesterolemia.  He presents here for sleepiness and snoring which has concerned his wife.  She has noted an unusual breathing pattern mostly a crescendo snoring that then is followed by an interrupted breathing pattern.   The patient feels that his sleep quality is not unusually poor, he just reports that he wakes up and usually early at about 5 AM and wanted.  Usually he is woken by the urge to urinate and then cannot go back to sleep.   He endorsed the Epworth sleepiness score at 8 points and the fatigue severity score at 19 points, the geriatric depression score was endorsed at 3 out of 15 points which is not a high level.  Chief complaint according to patient :The patient feels that his sleep quality is not unusually poor, he just reports that he wakes up and usually early at about 5 AM and wanted.  Usually he is woken by the urge to urinate and then cannot go back to sleep.   Sleep habits are as follows: Dinner time between 5 and 6 PM , followed by bedtime at 11 PM.  He is asleep in less than 30 minutes, immediately snoring. Sleeps on his sides ( back pain ), on one pillow. He wakes up four times for nocturia.  This is worse than last year- Seldomly dreams, he can;t recall, but his wife noted him talking. He sometimes moves in his dreams, has hallucinated while under medication influence- of gapapentin.  He still sweats excessively at night, notices that in AM when he goes to urinate. Averages 6 hours of interrupted sleep with snoring and apnea not waking him, but his wife- who wakes him. .      Sleep medical history;Multivel fusion surgeries, back problems, weakness in legs ( see Dr. Krista Blue reported ) DM, cholesterol, HTN. Leukemia.   Family sleep history: several siblings living,none with OSA.    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  Re-Married, since 09-2017, 2 children from first marriage, one deceased - suicide.    Review of Systems: Out of a complete 14 system review, the patient complains of only the following symptoms, and all other reviewed systems are negative.  snoring, early morning arousals.  Nodding off in daytime- While watching TV  Last time Epworth score 8/ 24  , Fatigue severity score now 46/ 63 - last time 19/ 63  , depression score 5/15   How likely are you to doze in the following situations: 0 = not likely, 1 = slight chance, 2 = moderate chance, 3 = high chance  Sitting and Reading? Watching Television? Sitting inactive in a public place (theater or meeting)? Lying down in the afternoon when circumstances permit? Sitting and talking to someone? Sitting quietly after lunch without alcohol? In a car, while stopped for a few minutes in traffic? As a passenger in a car for an hour without a break?  Total = 14     Social History   Socioeconomic History  . Marital status: Divorced    Spouse name: Not on file  . Number of children: 2  . Years of education: 34  . Highest education level: Not on file  Occupational History  . Occupation: Retired  Tobacco Use  . Smoking status: Former Smoker    Types: Pipe, Cigars, Cigarettes    Quit date: 10/31/1989    Years since quitting: 29.8  . Smokeless tobacco: Never Used  Substance and Sexual Activity  . Alcohol use: Yes    Comment: occasional  . Drug use: No  . Sexual activity: Not on file  Other Topics Concern  . Not on file  Social History Narrative   Lives with daughter.   Caffeine use: Drinks coffee daily (1-2 cups)   Right handed    Social Determinants of Health   Financial Resource Strain:   . Difficulty of Paying Living Expenses:   Food Insecurity:   . Worried About Charity fundraiser in the Last Year:   . Arboriculturist in the Last Year:   Transportation Needs:   . Lexicographer (Medical):   Marland Kitchen Lack of Transportation (Non-Medical):   Physical Activity:   . Days of Exercise per Week:   . Minutes of Exercise per Session:   Stress:   . Feeling of Stress :   Social Connections:   . Frequency of Communication with Friends and Family:   . Frequency of Social Gatherings with Friends and Family:   . Attends Religious Services:   . Active Member of Clubs or Organizations:   . Attends Archivist Meetings:   .  Marital Status:   Intimate Partner Violence:   . Fear of Current or Ex-Partner:   . Emotionally Abused:   Marland Kitchen Physically Abused:   . Sexually Abused:     Family History  Problem Relation Age of Onset  . Heart attack Mother   . Stroke Father   . Diabetes Sister   . Diabetes Brother     Past Medical History:  Diagnosis Date  . Cancer ([place])    Leukemia   . Cervical myelopathy ([place])   . Chronic sinusitis   . CLL (chronic lymphocytic leukemia) ([place])    see records in care everywhere from [place]  . Coronary artery disease    minimal nonobstructive by 10/31/16 cath at [facility] in [place]  . Diabetes mellitus without complication ([place])   . Diffuse large B-cell lymphoma of lymph nodes of multiple regions ([place]) 10/31/2018  . Diverticulosis   . Erectile dysfunction   . Fracture    left ankle fracture with retained hardware  . GERD (gastroesophageal reflux disease)   . Goals of care, counseling/discussion 10/31/2018  . Gout   . Hyperlipemia   . Hypertension   . Lumbar radiculopathy   . Memory loss   . OSA (obstructive sleep apnea)   . Peripheral neuropathy   . Renal cyst   . Sleep apnea    does not wear CPAP  . Thyroid nodule   . Wears dentures   . Wears glasses     Past Surgical History:  Procedure Laterality Date  . ANKLE RECONSTRUCTION Left 12/19/2018   Procedure: LEFT ANKLE REVISION FIXATION, REMOVAL OF HARDWARE, OPEN TREATMENT, SYNDESMOSIS, POSSIBLE ANKLE ARTHROTOMY, DELTOID LIGAMENT REPAIR;  Surgeon: Erle Crocker, MD;  Location: [facility];  Service: Orthopedics;  Laterality: Left;  . ANTERIOR CERVICAL DECOMP/DISCECTOMY FUSION N/A 04/20/2017   Procedure: ANTERIOR CERVICAL DECOMPRESSION FUSION, CERVICAL 3-4, CERVICAL 4-5 WITH INSTRUMENTATION AND ALLOGRAFT; REQUEST 3.5 HOURS;  Surgeon: Phylliss Bob, MD;  Location: [facility];  Service: Orthopedics;  Laterality: N/A;  ANTERIOR CERVICAL DECOMPRESSION FUSION, CERVICAL 3-4, CERVICAL 4-5 WITH INSTRUMENTATION AND ALLOGRAFT; REQUEST 3.5 HOURS  . BACK SURGERY    . CARDIAC CATHETERIZATION     in Care Everywhere 11/01/16  . COLONOSCOPY    . IR IMAGING GUIDED PORT INSERTION  11/10/2018  . MULTIPLE TOOTH EXTRACTIONS    . ORIF ANKLE FRACTURE Left 09/13/2018   Procedure: OPEN REDUCTION INTERNAL FIXATION (ORIF) LATERAL LEFT ANKLE FRACTURE;  Surgeon: Dorna Leitz, MD;  Location: [facility];  Service: Orthopedics;  Laterality: Left;  . SYNDESMOSIS REPAIR Left 09/13/2018   Procedure: OPEN REDUCTION INTERNAL FIXATION SYNDESMOSIS REPAIR LEFT ANKLE;  Surgeon: Dorna Leitz, MD;  Location: [facility];  Service: Orthopedics;  Laterality: Left;    Current Outpatient Medications  Medication Sig Dispense Refill  . acetaminophen (TYLENOL) 500 MG tablet Take 1,000 mg by mouth every 6 (six) hours as needed (pain).     Marland Kitchen allopurinol (ZYLOPRIM) 300 MG tablet Take 300 mg by mouth daily.    Marland Kitchen aspirin EC 81 MG tablet Take 81 mg by mouth daily.    Marland Kitchen atenolol (TENORMIN) 25 MG tablet Take 25 mg by mouth daily.     Marland Kitchen atorvastatin (LIPITOR) 40 MG tablet Take 40 mg by mouth daily.    . Cholecalciferol (VITAMIN D3 PO) Take 1 tablet by mouth daily.    Marland Kitchen CINNAMON PO Take 1 tablet by mouth daily.    . diclofenac sodium (VOLTAREN) 1 % GEL Apply 1 application topically as needed (pain).     Marland Kitchen  donepezil (ARICEPT ODT) 5 MG disintegrating tablet Take 5 mg by mouth at bedtime.    . DULoxetine (CYMBALTA) 60 MG capsule Take 1 capsule (60 mg total) by mouth daily. 30 capsule 3  . famotidine (PEPCID) 20 MG tablet Take 20 mg  by mouth at bedtime as needed for heartburn.     . lidocaine-prilocaine (EMLA) cream Apply to PAC 1 hour prior to procedure. 30 g 3  . lisinopril (ZESTRIL) 40 MG tablet Take 1 tablet (40 mg total) by mouth daily for 30 days. 30 tablet 0  . metFORMIN (GLUCOPHAGE-XR) 500 MG 24 hr tablet Take 500 mg by mouth every evening.     . Misc Natural Products (TART CHERRY ADVANCED PO) Take 1 tablet by mouth daily.    . NON FORMULARY Take 2 tablets by mouth daily. Blood Sugar Defense     . ondansetron (ZOFRAN) 8 MG tablet Take 1 tablet (8 mg total) by mouth 2 (two) times daily as needed for refractory nausea / vomiting. Start on day 3 after cyclophosphamide chemotherapy. 30 tablet 1  . potassium chloride (K-DUR) 10 MEQ tablet Take 10 mEq by mouth daily.     . tamsulosin (FLOMAX) 0.4 MG CAPS capsule Take 0.4 mg by mouth daily.    . traMADol (ULTRAM) 50 MG tablet Take 1 tablet (50 mg total) by mouth every 6 (six) hours as needed. 90 tablet 0   No current facility-administered medications for this visit.    Allergies as of 09/17/2019  . (No Known Allergies)    Vitals: BP 139/86   Pulse 92   Temp 97.8 F (36.6 C)   Ht 6\' 2"  (1.88 m)   Wt 282 lb (127.9 kg)   BMI 36.21 kg/m  Last Weight:  Wt Readings from Last 1 Encounters:  09/17/19 282 lb (127.9 kg)   [id number] mass index is 36.21 kg/m.      Ht Readings from Last 1 Encounters:  09/17/19 6\' 2"  (1.88 m)    Physical exam:  General: The patient is awake, alert and appears not in acute distress. The patient has a 3- day- stubble. Head: Normocephalic, atraumatic. Neck is supple. Mallampati 3,  neck circumference: 20. 25 . Nasal airflow patent , Retrognathia is seen.  Cardiovascular:  Regular rate and rhythm , without  murmurs or carotid bruit, and without distended neck veins. Respiratory: Lungs are clear to auscultation. Skin:  Without evidence of edema, or rash Trunk: BMI is 37 . The patient's posture is leaning to the right.   Neurologic  exam : The patient is awake and alert, oriented to place and time.    Attention span & concentration ability appears normal.  Speech is haltering, he seems to look for words.  Mood and affect are appropriate.  Cranial nerves: has slowly lost sense  of smell or taste- chemotherapy and radiation, patient has gotten both covid shots.  Pupils are equally round and sized, and briskly reactive to light. Extraocular movements  in vertical and horizontal planes intact and without nystagmus. Visual fields by finger perimetry are intact. Hearing to finger rub impaired. Facial sensation intact to fine touch.  Facial motor strength is symmetric and tongue and uvula move midline. Shoulder shrug was symmetrical.   Motor exam:  Normal tone, muscle bulk and symmetric strength in all extremities. ROm in left shoulder reduced.  Sensory:  Fine touch, pinprick and vibration were tested in all extremities. Proprioception tested in the upper extremities was normal. Coordination: Finger-to-nose maneuver normal without evidence of  ataxia, dysmetria . There is a mild symmetric resting tremor. Penmanship has changed.  Gait and station: Patient walks with a walker assistive device and is unable unassisted to climb up to the exam table.  Deep tendon reflexes: deferred.     Assessment:  After physical and neurologic examination, review of laboratory studies,  Personal review of imaging studies, reports of other /same  Imaging studies, results of polysomnography and / or neurophysiology testing and pre-existing records as far as provided in visit., my assessment is :    1) Mr. Causby has been continuously witnessed to have crescendo snoring, and interrupted nocturnal breathing so I have no doubt that severe sleep apnea is present. This was confirmed by basic PSG in 06-2019.he has central- complex apneas !  I will use his I as a baseline to get him into an attended titration , CPAP auto did not work , will likely need BiPAP.     Further noticed is mild retrognathia his lower jaw is smaller and he has an overbite.  This is an anatomical risk factor for sleep apnea.   His nasal patency is preserved.  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  2) he has much more severe nocturia now than last year- and higher EDS, fatigue.  3) He is also more likely to be fatigued as chemo- and radiation - a cancer patient, even in remission.  He is waiting for a PET scan next month.   The patient was advised of the nature of the diagnosed disorder , the treatment options and the  risks for general health and wellness arising from not treating the condition.   I spent more than 30 minutes of face to face time with the patient.  Greater than 50% of time was spent in counseling and coordination of care. We have discussed the diagnosis and differential and I answered the patient's questions.    Plan:  Treatment plan and additional workup :  Attended sleep study titration.  He had only few episodic enactment of dreams- REM sleep beahvior.    Larey Seat, MD [id number], [id number] PM  Certified in Neurology by ABPN Certified in [place] by [facility] [address] [place], [place] [address]

## 2019-09-18 DIAGNOSIS — A4151 Sepsis due to Escherichia coli [E. coli]: Secondary | ICD-10-CM | POA: Diagnosis not present

## 2019-09-18 DIAGNOSIS — E119 Type 2 diabetes mellitus without complications: Secondary | ICD-10-CM | POA: Diagnosis not present

## 2019-09-18 DIAGNOSIS — I2510 Atherosclerotic heart disease of native coronary artery without angina pectoris: Secondary | ICD-10-CM | POA: Diagnosis not present

## 2019-09-18 DIAGNOSIS — Z125 Encounter for screening for malignant neoplasm of prostate: Secondary | ICD-10-CM | POA: Diagnosis not present

## 2019-09-18 DIAGNOSIS — I10 Essential (primary) hypertension: Secondary | ICD-10-CM | POA: Diagnosis not present

## 2019-09-18 DIAGNOSIS — M1990 Unspecified osteoarthritis, unspecified site: Secondary | ICD-10-CM | POA: Diagnosis not present

## 2019-09-18 DIAGNOSIS — N390 Urinary tract infection, site not specified: Secondary | ICD-10-CM | POA: Diagnosis not present

## 2019-09-18 DIAGNOSIS — M5416 Radiculopathy, lumbar region: Secondary | ICD-10-CM | POA: Diagnosis not present

## 2019-09-18 DIAGNOSIS — E1142 Type 2 diabetes mellitus with diabetic polyneuropathy: Secondary | ICD-10-CM | POA: Diagnosis not present

## 2019-09-18 DIAGNOSIS — C8590 Non-Hodgkin lymphoma, unspecified, unspecified site: Secondary | ICD-10-CM | POA: Diagnosis not present

## 2019-09-18 DIAGNOSIS — C9110 Chronic lymphocytic leukemia of B-cell type not having achieved remission: Secondary | ICD-10-CM | POA: Diagnosis not present

## 2019-09-18 DIAGNOSIS — E785 Hyperlipidemia, unspecified: Secondary | ICD-10-CM | POA: Diagnosis not present

## 2019-09-18 DIAGNOSIS — M4712 Other spondylosis with myelopathy, cervical region: Secondary | ICD-10-CM | POA: Diagnosis not present

## 2019-09-19 ENCOUNTER — Telehealth: Payer: Self-pay

## 2019-09-19 DIAGNOSIS — G4731 Primary central sleep apnea: Secondary | ICD-10-CM

## 2019-09-19 DIAGNOSIS — R0683 Snoring: Secondary | ICD-10-CM

## 2019-09-19 DIAGNOSIS — G4730 Sleep apnea, unspecified: Secondary | ICD-10-CM

## 2019-09-19 DIAGNOSIS — G4710 Hypersomnia, unspecified: Secondary | ICD-10-CM

## 2019-09-19 NOTE — Telephone Encounter (Signed)
Order placed and added that information in the scheduling instructions.

## 2019-09-19 NOTE — Telephone Encounter (Signed)
Need a Split night study ordered.Patients last sleep is over a year old. We need a new baseline study to show an AHI for cpap. We will split after AHI is >10. Hopefully get him on cpap then to Bipap if not tolerated.

## 2019-09-25 DIAGNOSIS — C8590 Non-Hodgkin lymphoma, unspecified, unspecified site: Secondary | ICD-10-CM | POA: Diagnosis not present

## 2019-09-25 DIAGNOSIS — M5416 Radiculopathy, lumbar region: Secondary | ICD-10-CM | POA: Diagnosis not present

## 2019-09-25 DIAGNOSIS — A4151 Sepsis due to Escherichia coli [E. coli]: Secondary | ICD-10-CM | POA: Diagnosis not present

## 2019-09-25 DIAGNOSIS — I2510 Atherosclerotic heart disease of native coronary artery without angina pectoris: Secondary | ICD-10-CM | POA: Diagnosis not present

## 2019-09-25 DIAGNOSIS — C9110 Chronic lymphocytic leukemia of B-cell type not having achieved remission: Secondary | ICD-10-CM | POA: Diagnosis not present

## 2019-09-25 DIAGNOSIS — I10 Essential (primary) hypertension: Secondary | ICD-10-CM | POA: Diagnosis not present

## 2019-09-25 DIAGNOSIS — N390 Urinary tract infection, site not specified: Secondary | ICD-10-CM | POA: Diagnosis not present

## 2019-09-25 DIAGNOSIS — M4712 Other spondylosis with myelopathy, cervical region: Secondary | ICD-10-CM | POA: Diagnosis not present

## 2019-09-25 DIAGNOSIS — E1142 Type 2 diabetes mellitus with diabetic polyneuropathy: Secondary | ICD-10-CM | POA: Diagnosis not present

## 2019-10-02 DIAGNOSIS — I2510 Atherosclerotic heart disease of native coronary artery without angina pectoris: Secondary | ICD-10-CM | POA: Diagnosis not present

## 2019-10-02 DIAGNOSIS — E1142 Type 2 diabetes mellitus with diabetic polyneuropathy: Secondary | ICD-10-CM | POA: Diagnosis not present

## 2019-10-02 DIAGNOSIS — C9110 Chronic lymphocytic leukemia of B-cell type not having achieved remission: Secondary | ICD-10-CM | POA: Diagnosis not present

## 2019-10-02 DIAGNOSIS — I10 Essential (primary) hypertension: Secondary | ICD-10-CM | POA: Diagnosis not present

## 2019-10-02 DIAGNOSIS — M4712 Other spondylosis with myelopathy, cervical region: Secondary | ICD-10-CM | POA: Diagnosis not present

## 2019-10-02 DIAGNOSIS — N390 Urinary tract infection, site not specified: Secondary | ICD-10-CM | POA: Diagnosis not present

## 2019-10-02 DIAGNOSIS — A4151 Sepsis due to Escherichia coli [E. coli]: Secondary | ICD-10-CM | POA: Diagnosis not present

## 2019-10-02 DIAGNOSIS — M5416 Radiculopathy, lumbar region: Secondary | ICD-10-CM | POA: Diagnosis not present

## 2019-10-02 DIAGNOSIS — C8590 Non-Hodgkin lymphoma, unspecified, unspecified site: Secondary | ICD-10-CM | POA: Diagnosis not present

## 2019-10-04 DIAGNOSIS — N401 Enlarged prostate with lower urinary tract symptoms: Secondary | ICD-10-CM | POA: Diagnosis not present

## 2019-10-04 DIAGNOSIS — N281 Cyst of kidney, acquired: Secondary | ICD-10-CM | POA: Diagnosis not present

## 2019-10-04 DIAGNOSIS — R509 Fever, unspecified: Secondary | ICD-10-CM | POA: Diagnosis not present

## 2019-10-04 DIAGNOSIS — N390 Urinary tract infection, site not specified: Secondary | ICD-10-CM | POA: Diagnosis not present

## 2019-10-04 DIAGNOSIS — R3129 Other microscopic hematuria: Secondary | ICD-10-CM | POA: Diagnosis not present

## 2019-10-04 DIAGNOSIS — N3000 Acute cystitis without hematuria: Secondary | ICD-10-CM | POA: Diagnosis not present

## 2019-10-08 DIAGNOSIS — M4712 Other spondylosis with myelopathy, cervical region: Secondary | ICD-10-CM | POA: Diagnosis not present

## 2019-10-08 DIAGNOSIS — N390 Urinary tract infection, site not specified: Secondary | ICD-10-CM | POA: Diagnosis not present

## 2019-10-08 DIAGNOSIS — E1142 Type 2 diabetes mellitus with diabetic polyneuropathy: Secondary | ICD-10-CM | POA: Diagnosis not present

## 2019-10-08 DIAGNOSIS — I2510 Atherosclerotic heart disease of native coronary artery without angina pectoris: Secondary | ICD-10-CM | POA: Diagnosis not present

## 2019-10-08 DIAGNOSIS — C8590 Non-Hodgkin lymphoma, unspecified, unspecified site: Secondary | ICD-10-CM | POA: Diagnosis not present

## 2019-10-08 DIAGNOSIS — A4151 Sepsis due to Escherichia coli [E. coli]: Secondary | ICD-10-CM | POA: Diagnosis not present

## 2019-10-08 DIAGNOSIS — M5416 Radiculopathy, lumbar region: Secondary | ICD-10-CM | POA: Diagnosis not present

## 2019-10-08 DIAGNOSIS — C9110 Chronic lymphocytic leukemia of B-cell type not having achieved remission: Secondary | ICD-10-CM | POA: Diagnosis not present

## 2019-10-08 DIAGNOSIS — I10 Essential (primary) hypertension: Secondary | ICD-10-CM | POA: Diagnosis not present

## 2019-10-10 ENCOUNTER — Other Ambulatory Visit (HOSPITAL_COMMUNITY): Payer: Self-pay | Admitting: General Surgery

## 2019-10-10 DIAGNOSIS — N281 Cyst of kidney, acquired: Secondary | ICD-10-CM

## 2019-10-11 ENCOUNTER — Other Ambulatory Visit: Payer: Self-pay

## 2019-10-11 ENCOUNTER — Ambulatory Visit (INDEPENDENT_AMBULATORY_CARE_PROVIDER_SITE_OTHER): Payer: Medicare HMO | Admitting: Neurology

## 2019-10-11 ENCOUNTER — Encounter (HOSPITAL_COMMUNITY): Payer: Self-pay

## 2019-10-11 DIAGNOSIS — G4731 Primary central sleep apnea: Secondary | ICD-10-CM | POA: Diagnosis not present

## 2019-10-11 DIAGNOSIS — G4739 Other sleep apnea: Secondary | ICD-10-CM

## 2019-10-11 DIAGNOSIS — G4710 Hypersomnia, unspecified: Secondary | ICD-10-CM

## 2019-10-11 DIAGNOSIS — R0683 Snoring: Secondary | ICD-10-CM

## 2019-10-11 DIAGNOSIS — G4752 REM sleep behavior disorder: Secondary | ICD-10-CM

## 2019-10-11 NOTE — Progress Notes (Unsigned)
LE 2  Ronald Rice Male, 74 y.o., [birth year] MRN:  [id number] Phone:  [phone] Jerilynn Mages) PCP:  Deland Pretty, MD Coverage:  [facility] Medicare/[employer] Medicare Hmo Next Appt With Radiology ([facility] PET) 10/16/2019 at 9:00 AM  RE: Biopsy Received: Yesterday Message Contents  Arne Cleveland, MD  Mylen Mangan E      Ok   US aspiration  Consider ethanol sclerosis if drained dry  DDH   Previous Messages  ----- Message -----  From: Lenore Cordia  Sent: 10/10/2019  4:14 PM EDT  To: Ir Procedure Requests  Subject: Biopsy                      Procedure Requested: [place] FNA   Reason for Procedure: Renal Cyst aspiration w/sclerosis    Provider Requesting: Puschinsky, Fransico Him  Provider Telephone: [phone]    Other Info: Hx of Renal Cyst aspiration 3 liters @ [facility] May 23, 2018

## 2019-10-11 NOTE — Progress Notes (Signed)
Ronald Rice, 74 y.o., Jul 12, 1945 MRN:  [id number] Phone:  [phone] Ronald Rice) PCP:  Deland Pretty, MD Coverage:  [facility] Medicare/[employer] Medicare Hmo Next Appt With Radiology ([facility] PET) 10/16/2019 at 9:00 AM  RE: Biopsy Received: Today Message Contents  Corrie Mckusick, DO  [facility]      This can be performed at [facility] or at [facility].   I would, however, have the patient seen at [place] clinic for appointment before scheduling, as some VIR might not be treating this.    Recommend referring to [facility] for appointment.   Earleen Newport   Previous Messages  ----- Message -----  From: Lenore Cordia  Sent: 10/11/2019  9:21 AM EDT  To: Ir Procedure Requests  Subject: RE: Biopsy                    Can this be scheduled at [facility]? Or I need to schedule this at [facility] 8am?  ----- Message -----  From: Arne Cleveland, MD  Sent: 10/10/2019  5:06 PM EDT  To: Lenore Cordia  Subject: RE: Biopsy                    Ok   US aspiration  Consider ethanol sclerosis if drained dry  DDH   ----- Message -----  From: Lenore Cordia  Sent: 10/10/2019  4:14 PM EDT  To: Ir Procedure Requests  Subject: Biopsy                      Procedure Requested: [place] FNA   Reason for Procedure: Renal Cyst aspiration w/sclerosis    Provider Requesting: Puschinsky, Fransico Him  Provider Telephone: [phone]    Other Info: Hx of Renal Cyst aspiration 3 liters @ [facility] May 23, 2018

## 2019-10-12 ENCOUNTER — Other Ambulatory Visit: Payer: Self-pay | Admitting: General Surgery

## 2019-10-12 DIAGNOSIS — N281 Cyst of kidney, acquired: Secondary | ICD-10-CM

## 2019-10-16 ENCOUNTER — Other Ambulatory Visit: Payer: Self-pay

## 2019-10-16 ENCOUNTER — Encounter (HOSPITAL_COMMUNITY)
Admission: RE | Admit: 2019-10-16 | Discharge: 2019-10-16 | Disposition: A | Discharge status: Home / Self Care | Payer: Medicare HMO | Source: Ambulatory Visit | Attending: Hematology & Oncology | Admitting: Hematology & Oncology

## 2019-10-16 ENCOUNTER — Ambulatory Visit
Admission: RE | Admit: 2019-10-16 | Discharge: 2019-10-16 | Disposition: A | Discharge status: Home / Self Care | Payer: Medicare HMO | Source: Ambulatory Visit | Attending: General Surgery | Admitting: General Surgery

## 2019-10-16 ENCOUNTER — Encounter: Payer: Self-pay | Admitting: *Deleted

## 2019-10-16 DIAGNOSIS — C8330 Diffuse large B-cell lymphoma, unspecified site: Secondary | ICD-10-CM | POA: Diagnosis not present

## 2019-10-16 DIAGNOSIS — N281 Cyst of kidney, acquired: Secondary | ICD-10-CM | POA: Diagnosis not present

## 2019-10-16 DIAGNOSIS — D3502 Benign neoplasm of left adrenal gland: Secondary | ICD-10-CM | POA: Diagnosis not present

## 2019-10-16 DIAGNOSIS — I700 Atherosclerosis of aorta: Secondary | ICD-10-CM | POA: Insufficient documentation

## 2019-10-16 DIAGNOSIS — Z79899 Other long term (current) drug therapy: Secondary | ICD-10-CM | POA: Diagnosis not present

## 2019-10-16 DIAGNOSIS — C8338 Diffuse large B-cell lymphoma, lymph nodes of multiple sites: Secondary | ICD-10-CM | POA: Insufficient documentation

## 2019-10-16 DIAGNOSIS — C8331 Diffuse large B-cell lymphoma, lymph nodes of head, face, and neck: Secondary | ICD-10-CM | POA: Diagnosis not present

## 2019-10-16 HISTORY — PX: IR RADIOLOGIST EVAL & MGMT: IMG5224

## 2019-10-16 LAB — GLUCOSE, CAPILLARY: Glucose-Capillary: 154 mg/dL — ABNORMAL HIGH (ref 70–99)

## 2019-10-16 MED ORDER — FLUDEOXYGLUCOSE F - 18 (FDG) INJECTION
15.3000 | Freq: Once | INTRAVENOUS | Status: AC
  Administered 2019-10-16: 15.3 via INTRAVENOUS

## 2019-10-16 NOTE — Consult Note (Signed)
Chief Complaint: Patient was consulted remotely today (TeleHealth) for renal cyst aspiration at the request of Puschinsky,Richard W.    Referring Physician(s): Puschinsky,Richard W.  History of Present Illness: Ronald Rice is a 74 y.o. male with a history of prior recurrent Pseudomonas sepsis of unknown source and more recent hospitalization in early April for sepsis secondary to an E. coli urinary tract infection.  At that time he was hospitalized at [facility] from 08/30/2019 until 09/03/2019.  He has known longstanding multiple large cysts of the left kidney and associated decreased function of the left kidney.  Overall renal function is normal.  The 2 largest cysts yielded a total volume of 3 L of fluid with aspiration on 05/23/2018 with 20 cm upper pole and 16 cm lower pole cysts present at that time as well as several other smaller cysts.  Cytologic analysis and culture of the fluid from the cysts were negative.  Given recent urosepsis, the possibility of additional cyst aspiration and cyst cavity ablation was mentioned to Ronald Rice and his wife by Dr. Harlow Asa.  He did have a PET scan earlier today for follow-up of non-Hodgkin's lymphoma ordered by Dr. Marin Olp.  This demonstrates the upper pole cyst to currently measure approximately 13 cm in greatest diameter and the lower pole cyst 17 cm.  There are 3-4 other moderate sized cysts present measuring between 4.5-8.5 cm.  The upper pole cyst is partially rim calcified which is a chronic appearance.  There is no significant left-sided hydronephrosis.  The right kidney demonstrates 4 cysts with the largest measuring 5.5 cm.  Ronald Rice is asymptomatic with respect to the large left renal cysts.  He denies any left abdominal or flank pain.  Past Medical History:  Diagnosis Date  . Cancer ([place])    Leukemia   . Cervical myelopathy ([place])   . Chronic sinusitis   . CLL (chronic lymphocytic leukemia) ([place])    see records in care everywhere  from [place]  . Coronary artery disease    minimal nonobstructive by 10/31/16 cath at [facility] [place]  . Diabetes mellitus without complication ([place])   . Diffuse large B-cell lymphoma of lymph nodes of multiple regions ([place]) 10/31/2018  . Diverticulosis   . Erectile dysfunction   . Fracture    left ankle fracture with retained hardware  . GERD (gastroesophageal reflux disease)   . Goals of care, counseling/discussion 10/31/2018  . Gout   . Hyperlipemia   . Hypertension   . Lumbar radiculopathy   . Memory loss   . OSA (obstructive sleep apnea)   . Peripheral neuropathy   . Renal cyst   . Sleep apnea    does not wear CPAP  . Thyroid nodule   . Wears dentures   . Wears glasses     Past Surgical History:  Procedure Laterality Date  . ANKLE RECONSTRUCTION Left 12/19/2018   Procedure: LEFT ANKLE REVISION FIXATION, REMOVAL OF HARDWARE, OPEN TREATMENT, SYNDESMOSIS, POSSIBLE ANKLE ARTHROTOMY, DELTOID LIGAMENT REPAIR;  Surgeon: Erle Crocker, MD;  Location: [place];  Service: Orthopedics;  Laterality: Left;  . ANTERIOR CERVICAL DECOMP/DISCECTOMY FUSION N/A 04/20/2017   Procedure: ANTERIOR CERVICAL DECOMPRESSION FUSION, CERVICAL 3-4, CERVICAL 4-5 WITH INSTRUMENTATION AND ALLOGRAFT; REQUEST 3.5 HOURS;  Surgeon: Phylliss Bob, MD;  Location: [place];  Service: Orthopedics;  Laterality: N/A;  ANTERIOR CERVICAL DECOMPRESSION FUSION, CERVICAL 3-4, CERVICAL 4-5 WITH INSTRUMENTATION AND ALLOGRAFT; REQUEST 3.5 HOURS  . BACK SURGERY    . CARDIAC CATHETERIZATION  in [place] 11/01/16  . COLONOSCOPY    . IR IMAGING GUIDED PORT INSERTION  11/10/2018  . MULTIPLE TOOTH EXTRACTIONS    . ORIF ANKLE FRACTURE Left 09/13/2018   Procedure: OPEN REDUCTION INTERNAL FIXATION (ORIF) LATERAL LEFT ANKLE FRACTURE;  Surgeon: Dorna Leitz, MD;  Location: [place];  Service: Orthopedics;  Laterality: Left;  . SYNDESMOSIS REPAIR Left 09/13/2018   Procedure: OPEN REDUCTION INTERNAL FIXATION SYNDESMOSIS REPAIR  LEFT ANKLE;  Surgeon: Dorna Leitz, MD;  Location: [place];  Service: Orthopedics;  Laterality: Left;    Allergies: Patient has no known allergies.  Medications: Prior to Admission medications   Medication Sig Start Date End Date Taking? Authorizing Provider  acetaminophen (TYLENOL) 500 MG tablet Take 1,000 mg by mouth every 6 (six) hours as needed (pain).     [provider]  allopurinol (ZYLOPRIM) 300 MG tablet Take 300 mg by mouth daily.    [provider]  aspirin EC 81 MG tablet Take 81 mg by mouth daily.    [provider]  atenolol (TENORMIN) 25 MG tablet Take 25 mg by mouth daily.     [provider]  atorvastatin (LIPITOR) 40 MG tablet Take 40 mg by mouth daily.    [provider]  Cholecalciferol (VITAMIN D3 PO) Take 1 tablet by mouth daily.    [provider]  CINNAMON PO Take 1 tablet by mouth daily.    [provider]  diclofenac sodium (VOLTAREN) 1 % GEL Apply 1 application topically as needed (pain).  10/10/18   [provider]  donepezil (ARICEPT ODT) 5 MG disintegrating tablet Take 5 mg by mouth at bedtime. 03/29/19   [provider]  DULoxetine (CYMBALTA) 60 MG capsule Take 1 capsule (60 mg total) by mouth daily. 09/13/19   Volanda Napoleon, MD  famotidine (PEPCID) 20 MG tablet Take 20 mg by mouth at bedtime as needed for heartburn.     [provider]  lidocaine-prilocaine (EMLA) cream Apply to PAC 1 hour prior to procedure. 01/03/19   Volanda Napoleon, MD  lisinopril (ZESTRIL) 40 MG tablet Take 1 tablet (40 mg total) by mouth daily for 30 days. 10/29/18 12/20/27  Donne Hazel, MD  metFORMIN (GLUCOPHAGE-XR) 500 MG 24 hr tablet Take 500 mg by mouth every evening.  11/28/18   [provider]  Misc Natural Products (TART CHERRY ADVANCED PO) Take 1 tablet by mouth daily.    [provider]  NON FORMULARY Take 2 tablets by mouth daily. Blood Sugar Defense     [provider]  ondansetron (ZOFRAN) 8 MG tablet Take 1 tablet (8 mg total) by mouth 2 (two) times daily as needed for refractory nausea / vomiting. Start on day 3 after cyclophosphamide chemotherapy. 11/02/18   Volanda Napoleon, MD  potassium chloride (K-DUR) 10 MEQ tablet Take 10 mEq by mouth daily.     [provider]  tamsulosin (FLOMAX) 0.4 MG CAPS capsule Take 0.4 mg by mouth daily.    [provider]  traMADol (ULTRAM) 50 MG tablet Take 1 tablet (50 mg total) by mouth every 6 (six) hours as needed. 08/22/19   Volanda Napoleon, MD     Family History  Problem Relation Age of Onset  . Heart attack Mother   . Stroke Father   . Diabetes Sister   . Diabetes Brother     Social History   Socioeconomic History  . Marital status: Divorced    Spouse name: Not  on file  . Number of children: 2  . Years of education: 42  . Highest education level: Not on file  Occupational History  . Occupation: Retired  Tobacco Use  . Smoking status: Former Smoker    Types: Pipe, Cigars, Cigarettes    Quit date: 10/31/1989    Years since quitting: 29.9  . Smokeless tobacco: Never Used  Substance and Sexual Activity  . Alcohol use: Yes    Comment: occasional  . Drug use: No  . Sexual activity: Not on file  Other Topics Concern  . Not on file  Social History Narrative   Lives with daughter.   Caffeine use: Drinks coffee daily (1-2 cups)   Right handed    Social Determinants of Health   Financial Resource Strain:   . Difficulty of Paying Living Expenses:   Food Insecurity:   . Worried About Charity fundraiser in the Last Year:   . Arboriculturist in the Last Year:   Transportation Needs:   . Film/video editor (Medical):   Marland Kitchen Lack of Transportation (Non-Medical):   Physical Activity:   . Days of Exercise per Week:   . Minutes of Exercise per Session:   Stress:   . Feeling of Stress :   Social Connections:   . Frequency of Communication with Friends and Family:   . Frequency  of Social Gatherings with Friends and Family:   . Attends Religious Services:   . Active Member of Clubs or Organizations:   . Attends Archivist Meetings:   Marland Kitchen Marital Status:     ECOG Status: 0 - Asymptomatic  Review of Systems  Constitutional: Negative.   HENT: Negative.   Respiratory: Negative.   Cardiovascular: Negative.   Gastrointestinal: Negative.   Genitourinary: Negative.   Musculoskeletal: Negative.   Neurological: Negative.   Hematological: Negative.     Review of Systems: A 12 point ROS discussed and pertinent positives are indicated in the HPI above.  All other systems are negative.  Physical Exam No direct physical exam was performed (except for noted visual exam findings with Video Visits).    Vital Signs: There were no vitals taken for this visit.  Imaging: NM PET Image Restag (PS) Skull Base To Thigh  Result Date: 10/16/2019 CLINICAL DATA:  Subsequent treatment strategy for diffuse large B-cell lymphoma post chemotherapy, also with radiation to the LEFT neck. EXAM: NUCLEAR MEDICINE PET SKULL BASE TO THIGH TECHNIQUE: 15.3 mCi F-18 FDG was injected intravenously. Full-ring PET imaging was performed from the skull base to thigh after the radiotracer. CT data was obtained and used for attenuation correction and anatomic localization. Fasting blood glucose: 154 mg/dl COMPARISON:  05/30/2019, the most recent prior with numerous additional prior studies. FINDINGS: Mediastinal blood pool activity: SUV max 2.8 Liver activity: SUV max 4.44 NECK: Markedly diminished activity in the LEFT neck and resolution of many of the lymph nodes that were seen in this area on the previous study. LEFT level V lymph node is currently measures 5 mm (image 28, series 4) previously 11 mm. (SUVmax = 1.9) previously 5.4 LEFT level II lymph node not visible as a distinct lymph node on today's examination with no activity beyond blood pool within the the area. LEFT level III lymph node  also not visible as a discrete lymph node on today's study and without activity beyond blood pool. 1.5 cm ovoid area of decreased attenuation potentially a cystic or necrotic lymph node unchanged from previous exams. This  shows no activity beyond blood pool. No enlarged or hypermetabolic lymph nodes in the RIGHT neck. Incidental CT findings: RIGHT IJ Port-A-Cath remains in place. CHEST: No hypermetabolic mediastinal or hilar nodes. No suspicious pulmonary nodules on the CT scan. 1.2 cm RIGHT paratracheal lymph node (image 71, series 4) (SUVmax = 2.7) previously 2.5 Incidental CT findings: RIGHT IJ Port-A-Cath as described. Mild calcified atheromatous plaque in the thoracic aorta. Vasculature not well assessed given lack of intravenous contrast. ABDOMEN/PELVIS: No abnormal hypermetabolic activity within the liver, pancreas, adrenal glands, or spleen. No hypermetabolic lymph nodes in the abdomen or pelvis. Incidental CT findings: Stable appearance of presumed hepatic cysts. No pericholecystic stranding. Spleen is normal size without visible lesion. Pancreas is unremarkable. Mildly nodular appearance of the LEFT adrenal gland is a stable finding. Large LEFT renal cysts are similar to previous imaging. Smaller cysts on the RIGHT as before. Calcified atheromatous plaque of the abdominal aorta. No adenopathy by size criteria with scattered small lymph nodes that are unchanged. No acute gastrointestinal process. Herniation of small bowel through a small umbilical hernia. No acute bowel process. SKELETON: No focal hypermetabolic activity to suggest skeletal metastasis. Incidental CT findings: Signs of cervical spinal fusion, L1 cement augmentation and lumbosacral spinal fusion as before. IMPRESSION: 1. Resolution of FDG uptake in the LEFT neck and lymph nodes that showed increased metabolic activity on the prior study. 2. Ovoid area in the LEFT neck likely treated lymph node with cystic or necrotic change showing no FDG  uptake and unchanged in terms of size. Attention on follow-up. 3. Small RIGHT paratracheal lymph node with FDG activity just below blood pool or near blood pool. Deauville category 2 for this finding and overall assessment. Aortic Atherosclerosis (ICD10-I70.0). Electronically Signed   By: Zetta Bills M.D.   On: 10/16/2019 13:19    Labs:  CBC: Recent Labs    04/25/19 0842 06/12/19 0854 07/23/19 1203 09/13/19 0820  WBC 3.5* 3.4* 3.1* 3.9*  HGB 12.1* 13.5 14.1 13.8  HCT 34.3* 38.5* 39.2 38.6*  PLT 189 169 169 249    COAGS: Recent Labs    10/27/18 1452 11/10/18 0920  INR 1.1 0.9    BMP: Recent Labs    04/25/19 0842 06/12/19 0854 07/23/19 1203 09/13/19 0820  NA 139 139 141 141  K 3.7 3.3* 3.8 4.0  CL 104 103 105 104  CO2 26 27 29 28   GLUCOSE 205* 258* 255* 185*  BUN 14 11 9 11   CALCIUM 8.8* 9.7 9.5 9.3  CREATININE 0.93 1.16 1.05 1.16  GFRNONAA >60 >60 >60 >60  GFRAA >60 >60 >60 >60    LIVER FUNCTION TESTS: Recent Labs    04/25/19 0842 06/12/19 0854 07/23/19 1203 09/13/19 0820  BILITOT 0.4 0.5 0.5 0.5  AST 22 20 16 17   ALT 28 23 21 23   ALKPHOS 57 61 67 67  PROT 6.3* 6.5 6.4* 6.4*  ALBUMIN 4.3 4.3 4.3 4.1     Assessment and Plan:  I spoke with Ronald Rice and his wife by phone. His previous history is somewhat strange to implicate one or more large left renal cysts for recurrent infection.  Rather than trying to sclerose the largest cyst with alcohol which may or may not be very effective at that large of a cyst volume, a better approach may be to individually decompress cysts and analyze each separately for infection as well as inject them under fluoroscopy with contrast to see whether they communicate with the renal collecting system.  This  would allow a more selective approach to potential future cyst sclerosis with alcohol, if indicated.  At the volume of these large cysts, a somewhat reduced volume of alcohol would have to be utilized because of the risk  of some alcohol absorption and this would decrease the effectiveness of sclerosing the entire inner cyst lining.  Ronald Rice and his wife are in agreement to schedule cyst aspiration with contrast injection under fluoroscopy.  We will schedule this in IR at one of the hospitals.  Thank you for this interesting consult.  I greatly enjoyed meeting Ronald Rice and look forward to participating in their care.  A copy of this report was sent to the requesting provider on this date.  Electronically Signed: Azzie Roup 10/16/2019, 3:35 PM     I spent a total of 30 Minutes  in remote  clinical consultation, greater than 50% of which was counseling/coordinating care for left renal cyst aspiration.    Visit type: Audio only (telephone). Audio (no video) only due to patient's lack of internet/smartphone capability. Alternative for in-person consultation at [facility], [place] [address], [place], [place]. This visit type was conducted due to national recommendations for restrictions regarding the COVID-19 Pandemic (e.g. social distancing).  This format is felt to be most appropriate for this patient at this time.  All issues noted in this document were discussed and addressed.

## 2019-10-17 ENCOUNTER — Telehealth (HOSPITAL_COMMUNITY): Payer: Self-pay

## 2019-10-17 ENCOUNTER — Encounter: Payer: Medicare HMO | Admitting: Neurology

## 2019-10-17 ENCOUNTER — Telehealth: Payer: Self-pay | Admitting: *Deleted

## 2019-10-17 DIAGNOSIS — I2510 Atherosclerotic heart disease of native coronary artery without angina pectoris: Secondary | ICD-10-CM | POA: Diagnosis not present

## 2019-10-17 DIAGNOSIS — G4739 Other sleep apnea: Secondary | ICD-10-CM | POA: Insufficient documentation

## 2019-10-17 DIAGNOSIS — A4151 Sepsis due to Escherichia coli [E. coli]: Secondary | ICD-10-CM | POA: Diagnosis not present

## 2019-10-17 DIAGNOSIS — C9110 Chronic lymphocytic leukemia of B-cell type not having achieved remission: Secondary | ICD-10-CM | POA: Diagnosis not present

## 2019-10-17 DIAGNOSIS — M5416 Radiculopathy, lumbar region: Secondary | ICD-10-CM | POA: Diagnosis not present

## 2019-10-17 DIAGNOSIS — C8590 Non-Hodgkin lymphoma, unspecified, unspecified site: Secondary | ICD-10-CM | POA: Diagnosis not present

## 2019-10-17 DIAGNOSIS — G4752 REM sleep behavior disorder: Secondary | ICD-10-CM | POA: Insufficient documentation

## 2019-10-17 DIAGNOSIS — M4712 Other spondylosis with myelopathy, cervical region: Secondary | ICD-10-CM | POA: Diagnosis not present

## 2019-10-17 DIAGNOSIS — N390 Urinary tract infection, site not specified: Secondary | ICD-10-CM | POA: Diagnosis not present

## 2019-10-17 DIAGNOSIS — E1142 Type 2 diabetes mellitus with diabetic polyneuropathy: Secondary | ICD-10-CM | POA: Diagnosis not present

## 2019-10-17 DIAGNOSIS — I10 Essential (primary) hypertension: Secondary | ICD-10-CM | POA: Diagnosis not present

## 2019-10-17 NOTE — Progress Notes (Signed)
POLYSOMNOGRAPHY IMPRESSION :   1. Severe Obstructive Sleep Apnea (OSA) per HST had failed to be  treated with CPAP- this CPAP failure was noted in the SPLIT  protocol study. BiPAP at 13/9 cm water was the best tolerated and  effective setting and overcame Sleep Apnea in non-supine sleep  and allowed REM sleep.  2. In supine sleep , the patient required 18/13 cm water BiPAP  therapy.  3. Clear evidence in video and audio of REM BD in the second part  of this study.    RECOMMENDATIONS: BIPAP at 13/9 cm water was successful in  treating complex sleep apnea while sleeping in non- supine  position only.  I will order an auto BiPAP- starting at 13/9 and allowing an  increase in pressure up to 18/13 cm water.

## 2019-10-17 NOTE — Procedures (Signed)
PATIENT'S NAME:  Ronald Rice, Ronald Rice DOB:      [birth year]      MR#:    [id number]     DATE OF RECORDING: 10/11/2019 Rudene Christians REFERRING M.D.:  Marcial Pacas, MD and Deland Pretty, MD  Study Performed:  Split-Night Titration Study HISTORY:    " Patient had first been seen on 06-28-2018- now repeat visit 09-17-2019, presents because of untreated Complex SA.  2/23-2020 was last ST - he had severe complex sleep apnea ( AHI 71) and started on auto CPAP in 2020 when, due to the pandemic, he was placed on auto CPAP-not as intended in an attended sleep study , thus he was never fitted for a mask, etc-  -pressure was lowered but he still gave it no more than a week of trying. His type of apnea may need BIPAP anyway.  We are now hoping that we can attempt the process again so that he can effectively treat his apnea. He also has been severely fatigued, has Nocturia and is on chemotherapy and radiation for CLL "    The patient endorsed the Epworth Sleepiness Scale at 14 points   The patient's weight 282 pounds with a height of 74 (inches), resulting in a BMI of 36.2 kg/m2. The patient's neck circumference measured 20.25 inches.  CURRENT MEDICATIONS: Zestril, Flomax, K-Dur, Zofran, Zyloprim, Aspirin, Tenormin, Lipitor, Pepcid, Tylenol, Tramadol, Glucophage, Cymbalta, Aricept  PROCEDURE:  This is a multichannel digital polysomnogram utilizing the Somnostar 11.2 system.  Electrodes and sensors were applied and monitored per AASM Specifications.   EEG, EOG, Chin and Limb EMG, were sampled at 200 Hz.  ECG, Snore and Nasal Pressure, Thermal Airflow, Respiratory Effort, CPAP Flow and Pressure, Oximetry was sampled at 50 Hz. Digital video and audio were recorded.      BASELINE STUDY WITHOUT CPAP RESULTS:  Lights Out was at 23:00 and Lights On at 05:00.  Total recording time (TRT) was 175.5, with a total sleep time (TST) of 127.5 minutes.   The patient's sleep latency was 23 minutes.  REM latency was 0 minutes.  The sleep efficiency was 72.6  %.    SLEEP ARCHITECTURE: WASO (Wake after sleep onset) was 33 minutes, Stage N1 was 36 minutes, Stage N2 was 90.5 minutes, Stage N3 was 1 minutes and Stage R (REM sleep) was 0 minutes.  The percentages were Stage N1 28.2%, Stage N2 71.%, Stage N3 .8% and Stage R (REM sleep) 0% ( void of REM sleep) .   RESPIRATORY ANALYSIS:  There were a total of 27 respiratory events:  10 obstructive apneas, 0 central apneas and 0 mixed apneas with a total of 10 apneas and an apnea index (AI) of 4.7. There were 17 hypopneas with a hypopnea index of 8. The patient also had 0 respiratory event related arousals (RERAs).      The total APNEA/HYPOPNEA INDEX (AHI) was 12.7 /hour and the total RESPIRATORY DISTURBANCE INDEX was 12.7 /hour.  There was no REM sleep. The patient spent 94 minutes sleep time in the supine position 175 minutes in non-supine. The supine AHI was 77.8 /hour versus a non-supine AHI of 1.7 /hour. OXYGEN SATURATION & C02:  The wake baseline 02 saturation was 95%, with the lowest being 81%. Time spent below 89% saturation equaled 9 minutes. PERIODIC LIMB MOVEMENTS: The patient had a total of 0 Periodic Limb Movements.   EKG was in keeping with normal sinus rhythm (NSR)  TITRATION STUDY WITH CPAP RESULTS: PAP was initiated at 6 cmH20 with heated humidity  per AASM split night standards and pressure was advanced to 13 cmH20 because of hypopneas, apneas and desaturations. CPAP failed to reduce the AHI significantly and AHI rose to 48/h, in supine sleep. BiPAP was initiated at 14/10 cm water and increased gradually to 18/13cm in supine- hard to tolerate for the patient- changing to non-supine sleep a BiPAP pressure of 13/9 cm water was applied- achieving a reduction of the AHI to 0.0 /hour, 100% sleep efficiency was noted and 38 minutes of non -supine sleep at that setting, SpO2 nadir of 92% .   Total recording time (TRT) was 185 minutes, with a total sleep time (TST) of 141.5 minutes. The patient's sleep  latency was 29.5 minutes. REM latency was 68.5 minutes. The sleep efficiency was 76.5 %.    SLEEP ARCHITECTURE: Wake after sleep was 39.5 minutes, Stage N1 36 minutes, Stage N2 71.5 minutes, Stage N3 0 minutes and Stage R (REM sleep) 34 minutes. The percentages were: Stage N1 25.4%, Stage N2 50.5%, Stage N3 0% and Stage R (REM sleep) 24.0%.  RESPIRATORY ANALYSIS:  There were a total of 38 respiratory events: 32 obstructive apneas, 0 central apneas and 1 mixed apnea with 5 hypopneas.  The total APNEA/HYPOPNEA INDEX  (AHI) was 16.1 /hour and the total RESPIRATORY DISTURBANCE INDEX was 16.1 /hour.  0 events occurred in REM sleep and 38 events in NREM. The REM AHI was 0 /hour versus a non-REM AHI of 21.2 /hour. The patient spent 53% of total sleep time in the supine position. The supine AHI was 29.4 /hour, versus a non-supine AHI of 0.9/hour.  OXYGEN SATURATION & C02:  The wake baseline 02 saturation was 96%, with the lowest being 82%. Time spent below 89% saturation equaled only 3 minutes.  The arousals were noted as: 37 were spontaneous, 0 were associated with PLMs, 21 were associated with respiratory events. The patient had a total of 0 Periodic Limb Movements.   The patient displayed REM behavior disorder, frequent movements of the arms and legs and punching motions, grunting, moaning.    POLYSOMNOGRAPHY IMPRESSION :   1. Severe Obstructive Sleep Apnea (OSA) per HST had failed to be treated with CPAP- this CPAP failure was noted in the SPLIT protocol study. BiPAP at 13/9 cm water was the best tolerated and effective setting and overcame Sleep Apnea in non-supine sleep and allowed REM sleep.  2. In supine sleep , the patient required 18/13 cm water BiPAP therapy.  3. Clear evidence in video and audio of REM BD in the second part of this study.    RECOMMENDATIONS: BIPAP at 13/9 cm water was successful in treating complex sleep apnea while sleeping in non- supine position only.  I will order an  auto BiPAP- starting at 13/9 and allowing an increase in pressure up to 18/13 cm water.    A follow up appointment will be scheduled in the Sleep Clinic at [facility].      I certify that I have reviewed the entire raw data recording prior to the issuance of this report in accordance with the Standards of Accreditation of the American Academy of Sleep Medicine (AASM)    Larey Seat, M.D. Diplomat, Tax adviser of Psychiatry and Neurology  Diplomat, Tax adviser of Sleep Medicine Market researcher, [employer] [facility] at [employer]

## 2019-10-17 NOTE — Telephone Encounter (Signed)
-----   Message from Volanda Napoleon, MD sent at 10/16/2019  4:45 PM EDT ----- Call - NO active lymphoma!!  Ronald Rice

## 2019-10-17 NOTE — Telephone Encounter (Signed)
As noted below by Dr. Marin Olp, I informed the patient that there is NO active Lymphoma. He verbalized understanding.

## 2019-10-17 NOTE — Telephone Encounter (Signed)
Called to schedule renal cyst aspiration, no answer, vm full. Will try back later. AW

## 2019-10-17 NOTE — Addendum Note (Signed)
Addended by: Larey Seat on: 10/17/2019 03:15 PM   Modules accepted: Orders

## 2019-10-18 ENCOUNTER — Telehealth: Payer: Self-pay | Admitting: Neurology

## 2019-10-18 ENCOUNTER — Encounter: Payer: Self-pay | Admitting: Hematology & Oncology

## 2019-10-18 ENCOUNTER — Other Ambulatory Visit: Payer: Self-pay

## 2019-10-18 ENCOUNTER — Inpatient Hospital Stay: Payer: Medicare HMO

## 2019-10-18 ENCOUNTER — Inpatient Hospital Stay: Payer: Medicare HMO | Attending: Hematology & Oncology

## 2019-10-18 ENCOUNTER — Inpatient Hospital Stay (HOSPITAL_BASED_OUTPATIENT_CLINIC_OR_DEPARTMENT_OTHER): Payer: Medicare HMO | Admitting: Hematology & Oncology

## 2019-10-18 VITALS — BP 136/92 | HR 84 | Temp 97.1°F | Resp 17 | Wt 278.0 lb

## 2019-10-18 DIAGNOSIS — I10 Essential (primary) hypertension: Secondary | ICD-10-CM | POA: Diagnosis not present

## 2019-10-18 DIAGNOSIS — E785 Hyperlipidemia, unspecified: Secondary | ICD-10-CM | POA: Diagnosis not present

## 2019-10-18 DIAGNOSIS — A4151 Sepsis due to Escherichia coli [E. coli]: Secondary | ICD-10-CM | POA: Diagnosis not present

## 2019-10-18 DIAGNOSIS — I2510 Atherosclerotic heart disease of native coronary artery without angina pectoris: Secondary | ICD-10-CM | POA: Diagnosis not present

## 2019-10-18 DIAGNOSIS — Z452 Encounter for adjustment and management of vascular access device: Secondary | ICD-10-CM | POA: Diagnosis not present

## 2019-10-18 DIAGNOSIS — E1142 Type 2 diabetes mellitus with diabetic polyneuropathy: Secondary | ICD-10-CM | POA: Diagnosis not present

## 2019-10-18 DIAGNOSIS — Z0000 Encounter for general adult medical examination without abnormal findings: Secondary | ICD-10-CM | POA: Diagnosis not present

## 2019-10-18 DIAGNOSIS — C8338 Diffuse large B-cell lymphoma, lymph nodes of multiple sites: Secondary | ICD-10-CM | POA: Diagnosis not present

## 2019-10-18 DIAGNOSIS — C8590 Non-Hodgkin lymphoma, unspecified, unspecified site: Secondary | ICD-10-CM | POA: Diagnosis not present

## 2019-10-18 DIAGNOSIS — C9110 Chronic lymphocytic leukemia of B-cell type not having achieved remission: Secondary | ICD-10-CM | POA: Insufficient documentation

## 2019-10-18 DIAGNOSIS — E119 Type 2 diabetes mellitus without complications: Secondary | ICD-10-CM

## 2019-10-18 DIAGNOSIS — E1140 Type 2 diabetes mellitus with diabetic neuropathy, unspecified: Secondary | ICD-10-CM | POA: Diagnosis not present

## 2019-10-18 DIAGNOSIS — N390 Urinary tract infection, site not specified: Secondary | ICD-10-CM | POA: Diagnosis not present

## 2019-10-18 DIAGNOSIS — M5416 Radiculopathy, lumbar region: Secondary | ICD-10-CM | POA: Diagnosis not present

## 2019-10-18 DIAGNOSIS — Z95828 Presence of other vascular implants and grafts: Secondary | ICD-10-CM

## 2019-10-18 DIAGNOSIS — M4712 Other spondylosis with myelopathy, cervical region: Secondary | ICD-10-CM | POA: Diagnosis not present

## 2019-10-18 LAB — CMP (CANCER CENTER ONLY)
ALT: 16 U/L (ref 0–44)
AST: 15 U/L (ref 15–41)
Albumin: 4.1 g/dL (ref 3.5–5.0)
Alkaline Phosphatase: 66 U/L (ref 38–126)
Anion gap: 9 (ref 5–15)
BUN: 14 mg/dL (ref 8–23)
CO2: 26 mmol/L (ref 22–32)
Calcium: 9.2 mg/dL (ref 8.9–10.3)
Chloride: 107 mmol/L (ref 98–111)
Creatinine: 0.98 mg/dL (ref 0.61–1.24)
GFR, Est AFR Am: >60 mL/min (ref >60)
GFR, Estimated: >60 mL/min (ref >60)
Glucose, Bld: 198 mg/dL — ABNORMAL HIGH (ref 70–99)
Potassium: 3.8 mmol/L (ref 3.5–5.1)
Sodium: 142 mmol/L (ref 135–145)
Total Bilirubin: 0.4 mg/dL (ref 0.3–1.2)
Total Protein: 6.3 g/dL — ABNORMAL LOW (ref 6.5–8.1)

## 2019-10-18 LAB — CBC WITH DIFFERENTIAL (CANCER CENTER ONLY)
Abs Immature Granulocytes: 0.01 10*3/uL (ref 0.00–0.07)
Basophils Absolute: 0 10*3/uL (ref 0.0–0.1)
Basophils Relative: 1 %
Eosinophils Absolute: 0.1 10*3/uL (ref 0.0–0.5)
Eosinophils Relative: 3 %
HCT: 36.4 % — ABNORMAL LOW (ref 39.0–52.0)
Hemoglobin: 13 g/dL (ref 13.0–17.0)
Immature Granulocytes: 0 %
Lymphocytes Relative: 34 %
Lymphs Abs: 1.2 10*3/uL (ref 0.7–4.0)
MCH: 31 pg (ref 26.0–34.0)
MCHC: 35.7 g/dL (ref 30.0–36.0)
MCV: 86.9 fL (ref 80.0–100.0)
Monocytes Absolute: 0.5 10*3/uL (ref 0.1–1.0)
Monocytes Relative: 14 %
Neutro Abs: 1.7 10*3/uL (ref 1.7–7.7)
Neutrophils Relative %: 48 %
Platelet Count: 161 10*3/uL (ref 150–400)
RBC: 4.19 MIL/uL — ABNORMAL LOW (ref 4.22–5.81)
RDW: 13.5 % (ref 11.5–15.5)
WBC Count: 3.5 10*3/uL — ABNORMAL LOW (ref 4.0–10.5)
nRBC: 0 % (ref 0.0–0.2)

## 2019-10-18 LAB — HEMOGLOBIN A1C
Hgb A1c MFr Bld: 6.8 % — ABNORMAL HIGH (ref 4.8–5.6)
Mean Plasma Glucose: 148.46 mg/dL

## 2019-10-18 MED ORDER — SODIUM CHLORIDE 0.9% FLUSH
10.0000 mL | Freq: Once | INTRAVENOUS | Status: AC
  Administered 2019-10-18: 10 mL via INTRAVENOUS
  Filled 2019-10-18: qty 10

## 2019-10-18 MED ORDER — HEPARIN SOD (PORK) LOCK FLUSH 100 UNIT/ML IV SOLN
500.0000 [IU] | Freq: Once | INTRAVENOUS | Status: AC
  Administered 2019-10-18: 500 [IU] via INTRAVENOUS
  Filled 2019-10-18: qty 5

## 2019-10-18 NOTE — Telephone Encounter (Signed)
-----   Message from Larey Seat, MD sent at 10/17/2019  3:15 PM EDT ----- POLYSOMNOGRAPHY IMPRESSION :   1. Severe Obstructive Sleep Apnea (OSA) per HST had failed to be  treated with CPAP- this CPAP failure was noted in the SPLIT  protocol study. BiPAP at 13/9 cm water was the best tolerated and  effective setting and overcame Sleep Apnea in non-supine sleep  and allowed REM sleep.  2. In supine sleep , the patient required 18/13 cm water BiPAP  therapy.  3. Clear evidence in video and audio of REM BD in the second part  of this study.    RECOMMENDATIONS: BIPAP at 13/9 cm water was successful in  treating complex sleep apnea while sleeping in non- supine  position only.  I will order an auto BiPAP- starting at 13/9 and allowing an  increase in pressure up to 18/13 cm water.

## 2019-10-18 NOTE — Progress Notes (Signed)
Hematology and Oncology Follow Up Visit  Ronald Rice [id number] Apr 19, 1946 74 y.o. 10/18/2019   Principle Diagnosis:   Diffuse large cell non-Hodgkin's lymphoma-Richter's transformation from CLL  Current Therapy:    R-CHOP-s/p cycle #8-- started on 11/08/2018  XRT to the LEFT neck -- 4500 rad -- completed on 08/01/2019     Interim History:  Ronald Rice is back for for follow-up.  Overall, he is doing pretty well.  He comes in a wheelchair.  He still has a hard time get around because of the broken left leg that he incurred.,  Sure when he sees orthopedic surgery again.  Long-term, I clearly his diabetes can be his biggest problem.  Today, his blood sugar is 200.  I am not sure when he sees his diabetic doctor.  As far as the lymphoma is concerned, we did a PET scan on him last week.  The PET scan did not show any active disease.  I think that the radiation therapy that he completed back in March really helped.  I told him that he is in remission.  He is not cured yet.  I think if he can go 3 or 4 years in remission then the risk of his lymphoma coming back will be less than 10%.  He does have a little bit of neuropathy in his hands.  Again this is mostly from the diabetes but it could also be from the chemotherapy that he had.  He is on Cymbalta.  The dose of Cymbalta might need to be increased from 30 mg to 60 mg.  He supposed be on vitamin B6 at 250 mg a day.  I am not sure if he is taking this.  He has had no problems with bowels or bladder.  He has had little bit of leg swelling.  Overall, I would say his performance status is ECOG 1.    Medications:  Current Outpatient Medications:  .  acetaminophen (TYLENOL) 500 MG tablet, Take 1,000 mg by mouth every 6 (six) hours as needed (pain). , Disp: , Rfl:  .  allopurinol (ZYLOPRIM) 300 MG tablet, Take 300 mg by mouth daily., Disp: , Rfl:  .  amoxicillin-clavulanate (AUGMENTIN) 875-125 MG tablet, , Disp: , Rfl:  .  aspirin EC 81 MG  tablet, Take 81 mg by mouth daily., Disp: , Rfl:  .  atenolol (TENORMIN) 25 MG tablet, Take 25 mg by mouth daily. , Disp: , Rfl:  .  atorvastatin (LIPITOR) 40 MG tablet, Take 40 mg by mouth daily., Disp: , Rfl:  .  CINNAMON PO, Take 1 tablet by mouth daily., Disp: , Rfl:  .  donepezil (ARICEPT ODT) 5 MG disintegrating tablet, Take 5 mg by mouth at bedtime., Disp: , Rfl:  .  famotidine (PEPCID) 20 MG tablet, Take 20 mg by mouth at bedtime as needed for heartburn. , Disp: , Rfl:  .  Lancets (ONETOUCH DELICA PLUS 123XX123) MISC, , Disp: , Rfl:  .  lidocaine-prilocaine (EMLA) cream, Apply to PAC 1 hour prior to procedure., Disp: 30 g, Rfl: 3 .  lisinopril (ZESTRIL) 40 MG tablet, Take 1 tablet (40 mg total) by mouth daily for 30 days., Disp: 30 tablet, Rfl: 0 .  metFORMIN (GLUCOPHAGE-XR) 500 MG 24 hr tablet, Take 500 mg by mouth every evening. , Disp: , Rfl:  .  potassium chloride (K-DUR) 10 MEQ tablet, Take 10 mEq by mouth daily. , Disp: , Rfl:  .  tamsulosin (FLOMAX) 0.4 MG CAPS capsule, Take 0.4 mg  by mouth daily., Disp: , Rfl:  .  Cholecalciferol (VITAMIN D3 PO), Take 1 tablet by mouth daily., Disp: , Rfl:  .  diclofenac sodium (VOLTAREN) 1 % GEL, Apply 1 application topically as needed (pain). , Disp: , Rfl:  .  DULoxetine (CYMBALTA) 60 MG capsule, Take 1 capsule (60 mg total) by mouth daily., Disp: 30 capsule, Rfl: 3 .  ipratropium (ATROVENT) 0.06 % nasal spray, , Disp: , Rfl:  .  Misc Natural Products (TART CHERRY ADVANCED PO), Take 1 tablet by mouth daily., Disp: , Rfl:  .  NON FORMULARY, Take 2 tablets by mouth daily. Blood Sugar Defense , Disp: , Rfl:  .  traMADol (ULTRAM) 50 MG tablet, Take 1 tablet (50 mg total) by mouth every 6 (six) hours as needed., Disp: 90 tablet, Rfl: 0  Allergies:  No Known Allergies  Past Medical History, Surgical history, Social history, and Family History were reviewed and updated.  Review of Systems: Review of Systems  Constitutional: Negative.   HENT:   Negative.   Eyes: Negative.   Respiratory: Negative.   Cardiovascular: Negative.   Gastrointestinal: Negative.   Endocrine: Negative.   Genitourinary: Positive for nocturia.   Musculoskeletal: Positive for arthralgias, gait problem and myalgias.  Neurological: Positive for gait problem.  Hematological: Negative.   Psychiatric/Behavioral: Negative.     Physical Exam:  vitals were not taken for this visit.   Wt Readings from Last 3 Encounters:  10/18/19 278 lb 0.6 oz (126.1 kg)  09/17/19 282 lb (127.9 kg)  08/07/19 281 lb (127.5 kg)    Physical Exam Vitals reviewed.  HENT:     Head: Normocephalic and atraumatic.  Eyes:     Pupils: Pupils are equal, round, and reactive to light.  Neck:     Comments: Examination of his neck shows marked improvement in the adenopathy in the left neck.  He still has some palpable posterior cervical lymphadenopathy.  It probably measures about 2 x 2 cm.  It is not as firm.  It is somewhat mobile.. Cardiovascular:     Rate and Rhythm: Normal rate and regular rhythm.     Heart sounds: Normal heart sounds.  Pulmonary:     Effort: Pulmonary effort is normal.     Breath sounds: Normal breath sounds.  Abdominal:     General: Bowel sounds are normal.     Palpations: Abdomen is soft.  Musculoskeletal:        General: No tenderness or deformity. Normal range of motion.     Comments: He has a cast on the right foot and lower leg.  This goes about two thirds the way up his right leg.  Left leg is unremarkable.  He has no swelling.  He has decent pulses.  He has good range of motion of his joints.  Lymphadenopathy:     Cervical: No cervical adenopathy.  Skin:    General: Skin is warm and dry.     Findings: No erythema or rash.  Neurological:     Mental Status: He is alert and oriented to person, place, and time.  Psychiatric:        Behavior: Behavior normal.        Thought Content: Thought content normal.        Judgment: Judgment normal.       Lab Results  Component Value Date   WBC 3.5 (L) 10/18/2019   HGB 13.0 10/18/2019   HCT 36.4 (L) 10/18/2019   MCV 86.9 10/18/2019  PLT 161 10/18/2019     Chemistry      Component Value Date/Time   NA 142 10/18/2019 0915   NA 142 01/25/2017 1217   K 3.8 10/18/2019 0915   K 4.1 01/25/2017 1217   CL 107 10/18/2019 0915   CL 102 01/25/2017 1217   CO2 26 10/18/2019 0915   CO2 33 01/25/2017 1217   BUN 14 10/18/2019 0915   BUN 12 01/25/2017 1217   CREATININE 0.98 10/18/2019 0915   CREATININE 1.4 (H) 01/25/2017 1217      Component Value Date/Time   CALCIUM 9.2 10/18/2019 0915   CALCIUM 9.7 01/25/2017 1217   ALKPHOS 66 10/18/2019 0915   ALKPHOS 59 01/25/2017 1217   AST 15 10/18/2019 0915   ALT 16 10/18/2019 0915   ALT 32 01/25/2017 1217   BILITOT 0.4 10/18/2019 0915       Impression and Plan: Ronald Rice is a 74 year old African-American male.  He was diagnosed with CLL back in [place].  I told him that he can travel.  He wants to go down to [place] to see family.  I have no problems with that from my point of view.  I do not see that he needs another PET scan.  We will get one if lab start to change or if he starts noticed anything unusual.  I cannot emphasize enough that I do think that his diabetes is going to be a bigger problem long-term for him.  We will give him through the summertime now.  He will come back in 2 months for a Port-A-Cath flush.     Volanda Napoleon, MD 5/27/202110:19 AM

## 2019-10-18 NOTE — Addendum Note (Signed)
Addended by: Amelia Jo I on: 10/18/2019 10:27 AM   Modules accepted: Orders

## 2019-10-18 NOTE — Telephone Encounter (Signed)
I called pt. I advised pt that Dr. Brett Fairy reviewed their sleep study results and found that pt has apnea and was best treated on BiPAP. Dr. Brett Fairy recommends that pt starts auto BiPAP. I reviewed PAP compliance expectations with the pt. Pt is agreeable to starting a CPAP. I advised pt that an order will be sent to a DME, [facility], and [facility] will call the pt within about one week after they file with the pt's insurance. [facility] will show the pt how to use the machine, fit for masks, and troubleshoot the CPAP if needed. A follow up appt was made for insurance purposes with Ward Givens, NP Aug 17,2021 at 1:15 pm. Pt verbalized understanding to arrive 15 minutes early and bring their CPAP. A letter with all of this information in it will be mailed to the pt as a reminder. I verified with the pt that the address we have on file is correct. Pt verbalized understanding of results. Pt had no questions at this time but was encouraged to call back if questions arise. I have sent the order to [facility] and have received confirmation that they have received the order.

## 2019-10-18 NOTE — Patient Instructions (Signed)

## 2019-10-19 LAB — LACTATE DEHYDROGENASE: LDH: 159 U/L (ref 98–192)

## 2019-10-23 ENCOUNTER — Encounter (HOSPITAL_COMMUNITY): Payer: Self-pay

## 2019-10-23 ENCOUNTER — Ambulatory Visit (HOSPITAL_COMMUNITY): Payer: Medicare HMO

## 2019-10-24 DIAGNOSIS — M5416 Radiculopathy, lumbar region: Secondary | ICD-10-CM | POA: Diagnosis not present

## 2019-10-24 DIAGNOSIS — M4712 Other spondylosis with myelopathy, cervical region: Secondary | ICD-10-CM | POA: Diagnosis not present

## 2019-10-24 DIAGNOSIS — A4151 Sepsis due to Escherichia coli [E. coli]: Secondary | ICD-10-CM | POA: Diagnosis not present

## 2019-10-24 DIAGNOSIS — N390 Urinary tract infection, site not specified: Secondary | ICD-10-CM | POA: Diagnosis not present

## 2019-10-25 ENCOUNTER — Other Ambulatory Visit: Payer: Self-pay | Admitting: Hematology & Oncology

## 2019-10-25 DIAGNOSIS — C8338 Diffuse large B-cell lymphoma, lymph nodes of multiple sites: Secondary | ICD-10-CM

## 2019-10-25 IMAGING — DX LEFT FOOT - COMPLETE 3+ VIEW
3 series · 3 of 3 positions shown · non-contrast
Comparison: 10/26/2018, 09/10/2018

CLINICAL DATA: Fall getting out of bed

EXAM:
LEFT TIBIA AND FIBULA - 2 VIEW; LEFT ANKLE COMPLETE - 3+ VIEW; LEFT
FOOT - COMPLETE 3+ VIEW

[foot ap]
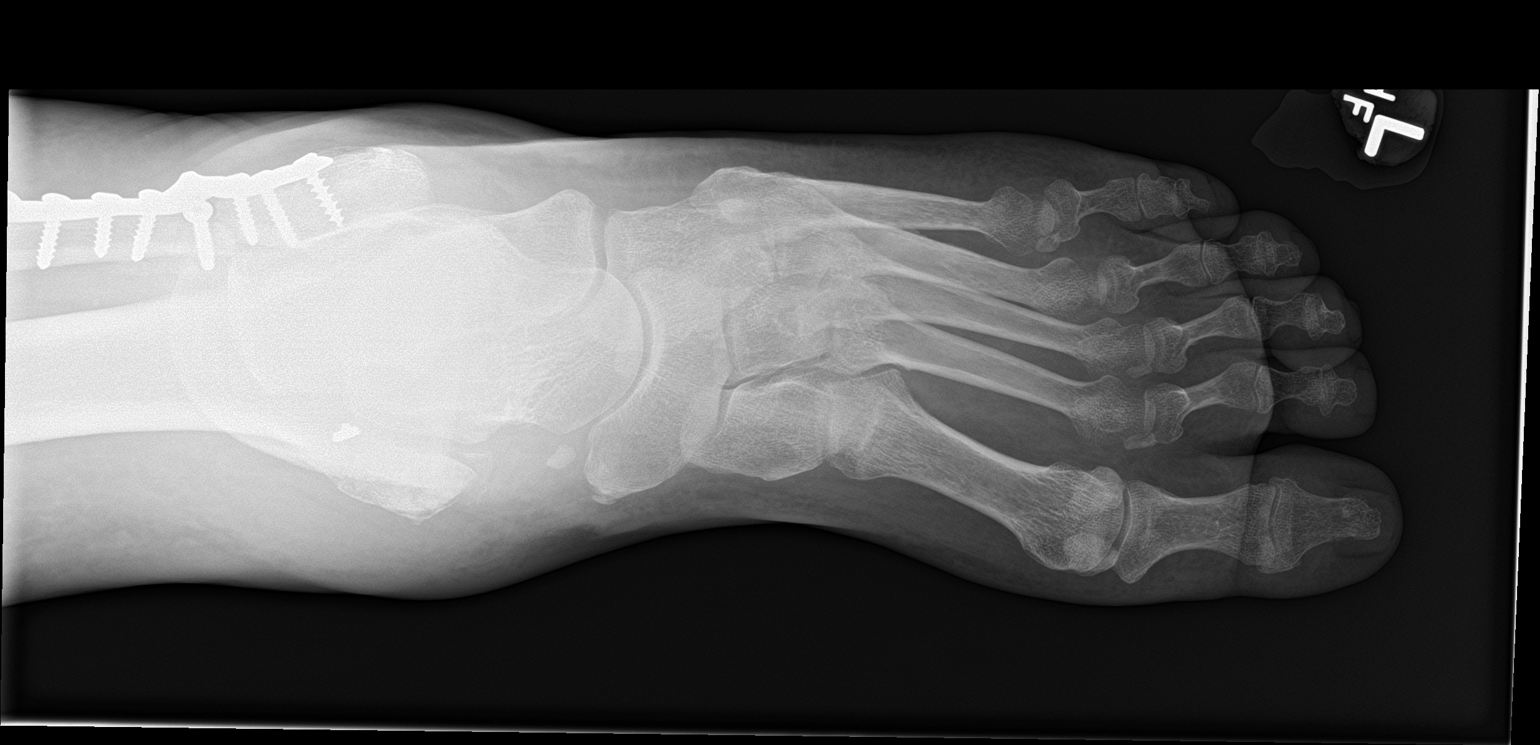

[foot obl]
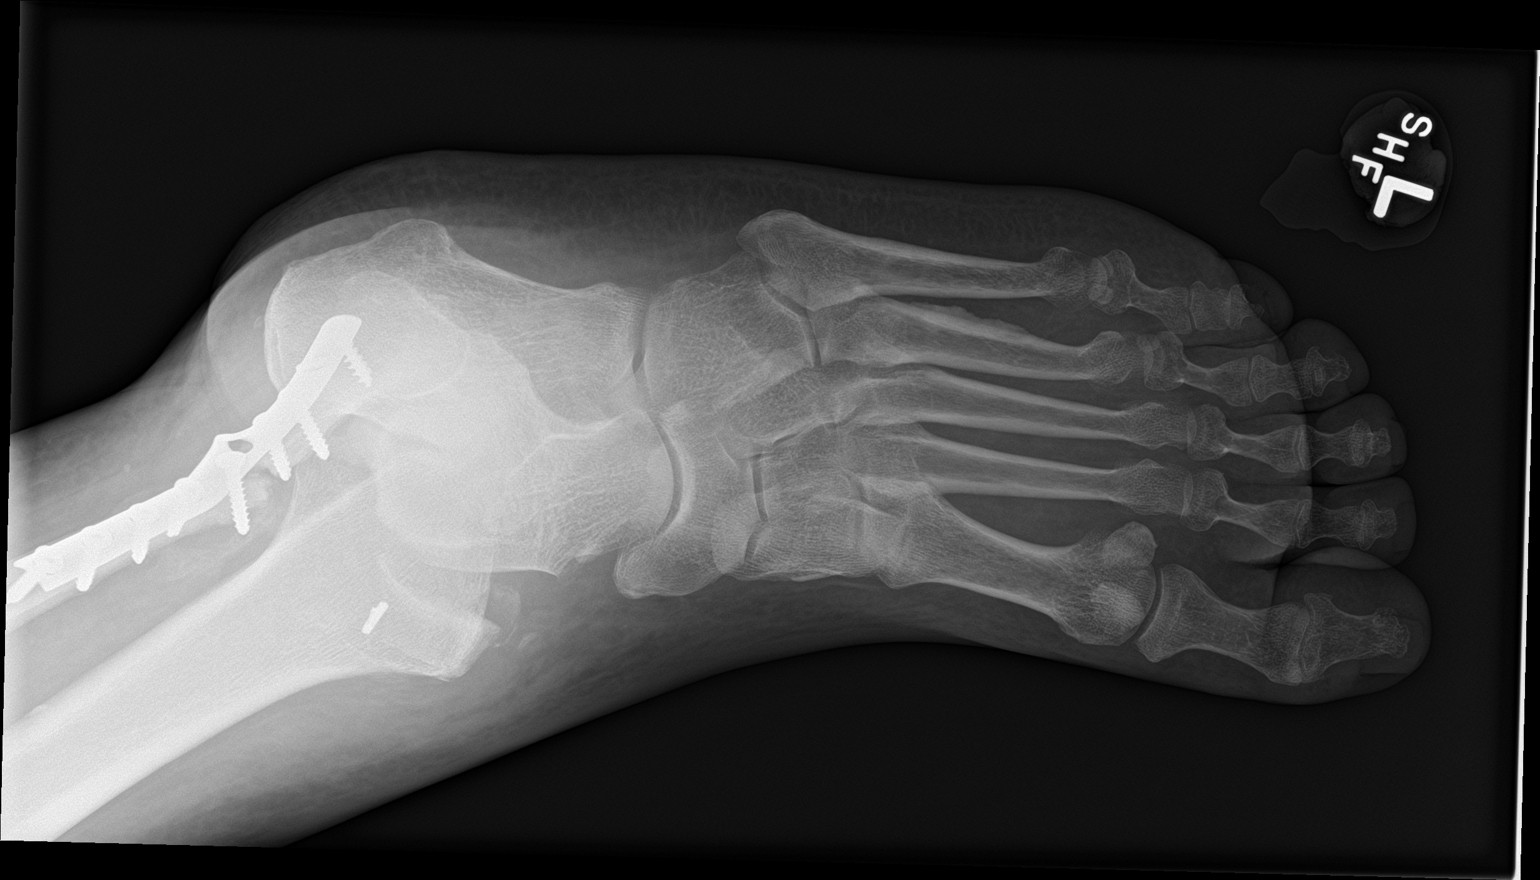

[foot lat]
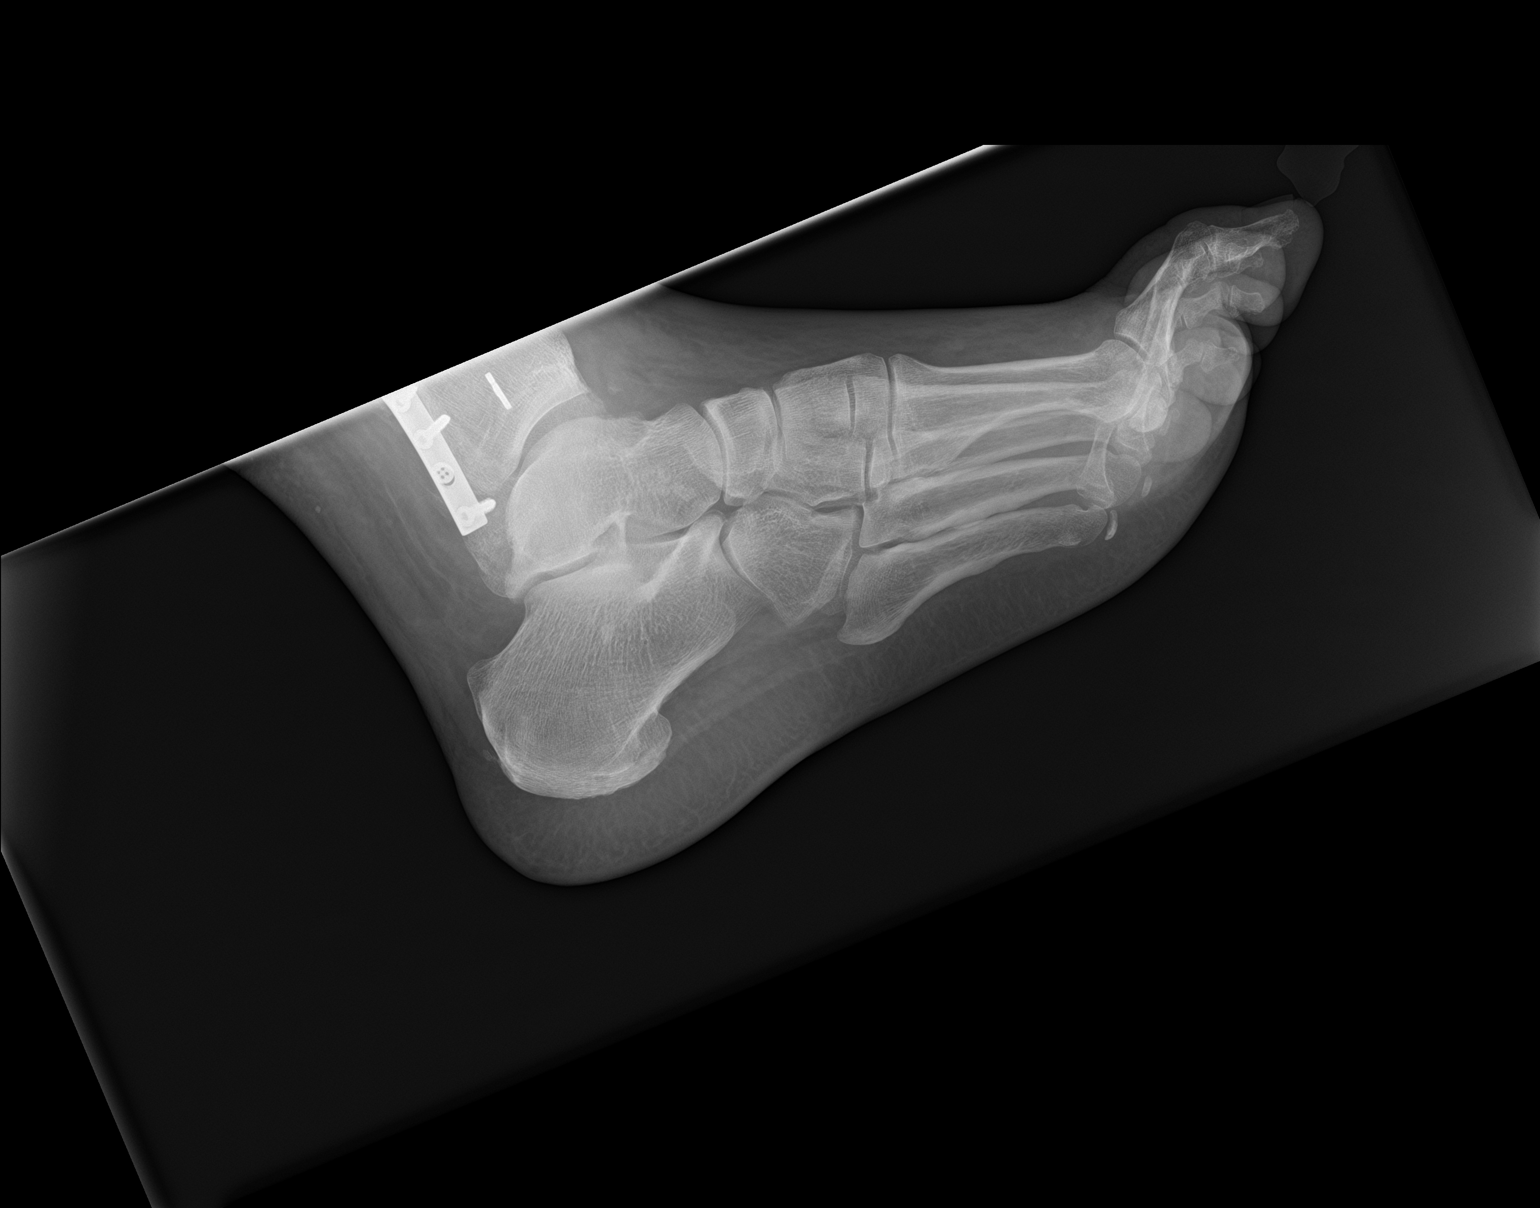

[3 of 3 positions shown; findings below may reference images not displayed]

FINDINGS: There is redemonstrated plate and screw fixation of an oblique,
mildly displaced fracture of the distal left tibia and fibula. The
plate is now bent, with new posterior angulation of the distal
fibular fragment. There is a new displaced fracture fragment of the
medial malleolar tip and lateral and anterior widening of the ankle
mortise. Extensive soft tissue edema about the foot and ankle. No
fracture or dislocation of the proximal left tibia or fibula or left
foot.
IMPRESSION: 1. There is redemonstrated plate and screw fixation of an oblique,
mildly displaced fracture of the distal left tibia and fibula. The
plate is now bent, with new posterior angulation of the distal
fibular fragment.

2. There is a new displaced fracture fragment of the medial
malleolar tip and lateral and anterior widening of the ankle
mortise.

3.  Extensive soft tissue edema about the left foot and ankle.

4. No fracture or dislocation of the proximal left tibia or fibula
or left foot.

## 2019-10-25 IMAGING — DX LEFT ANKLE COMPLETE - 3+ VIEW
4 series · 4 of 4 positions shown · non-contrast
Comparison: 10/26/2018, 09/10/2018

CLINICAL DATA: Fall getting out of bed

EXAM:
LEFT TIBIA AND FIBULA - 2 VIEW; LEFT ANKLE COMPLETE - 3+ VIEW; LEFT
FOOT - COMPLETE 3+ VIEW

[ankle ap]
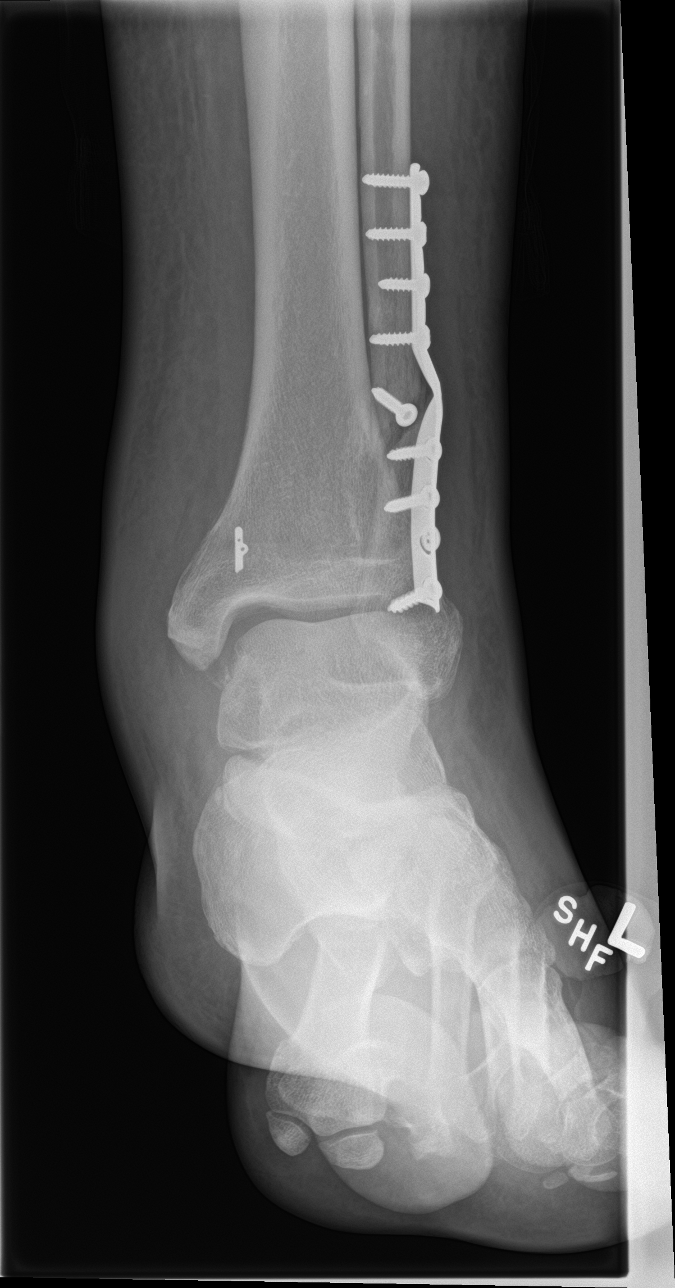

[ankle obl (1 of 2)]
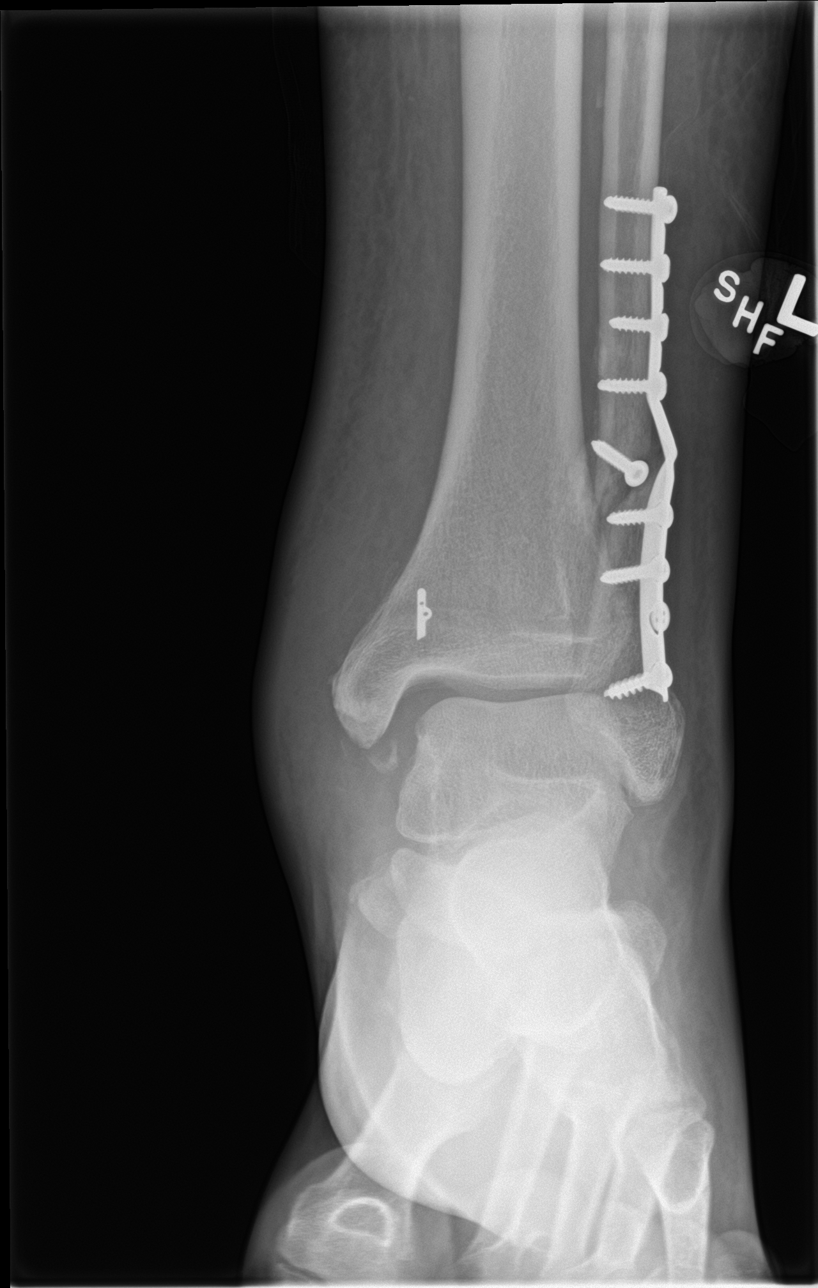

[ankle lat]
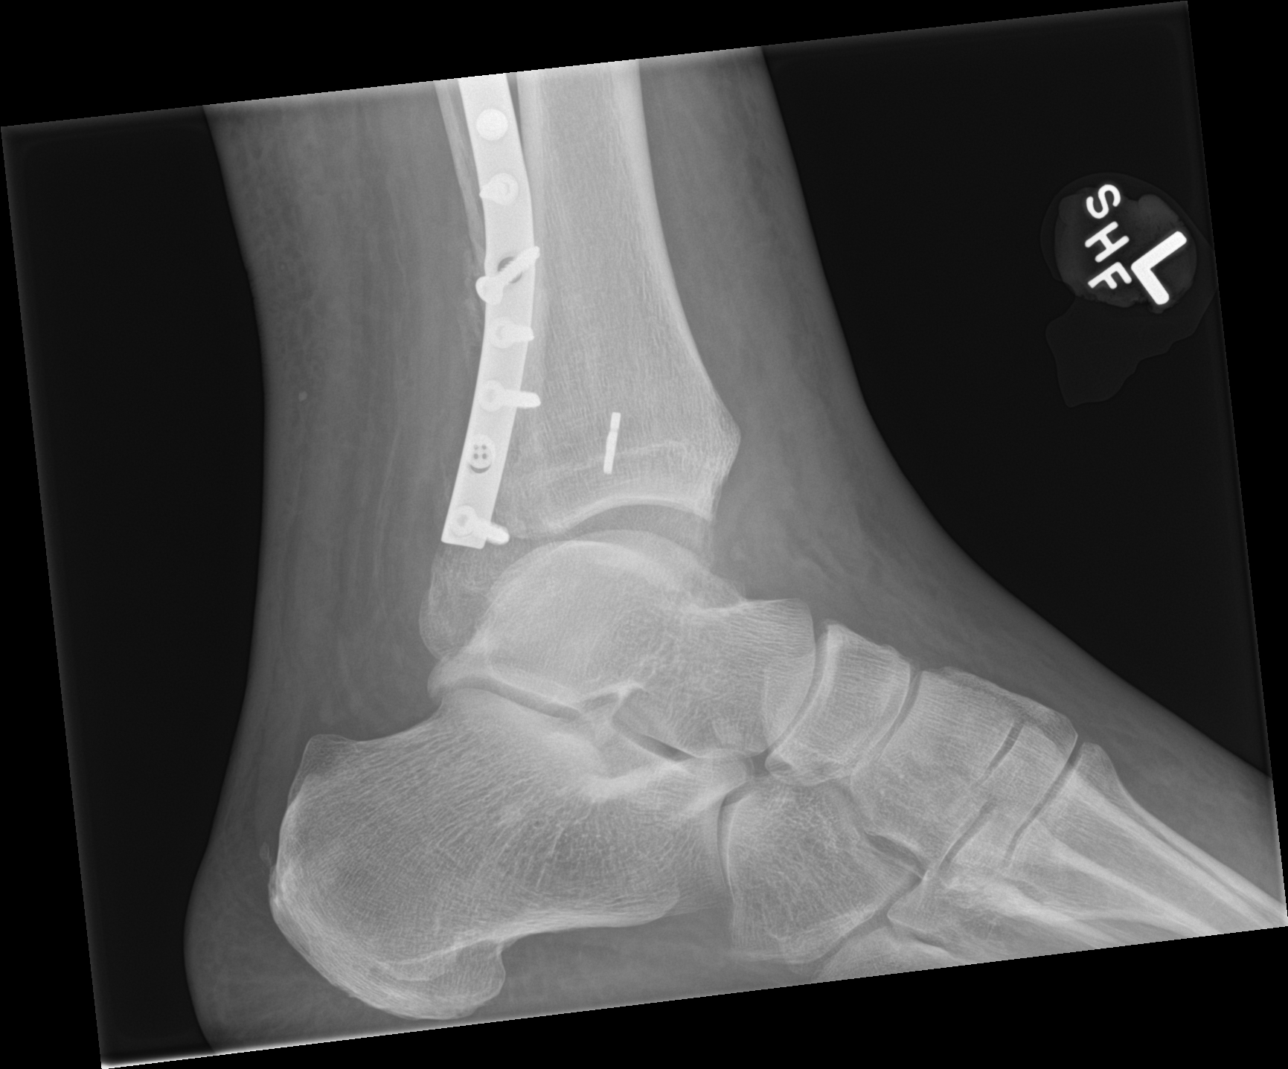

[ankle obl (2 of 2)]
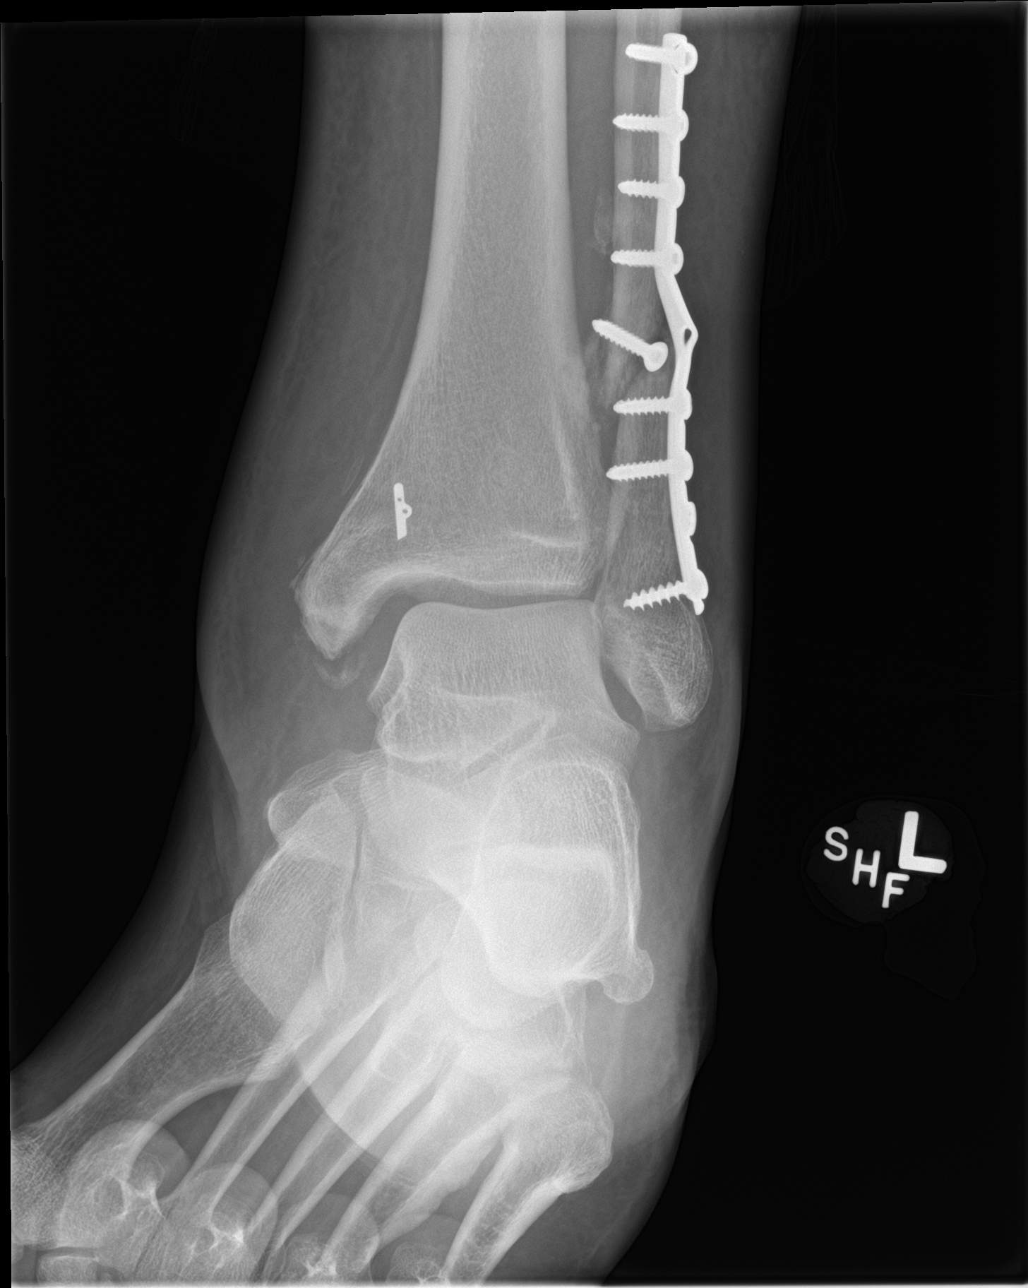

[4 of 4 positions shown; findings below may reference images not displayed]

FINDINGS: There is redemonstrated plate and screw fixation of an oblique,
mildly displaced fracture of the distal left tibia and fibula. The
plate is now bent, with new posterior angulation of the distal
fibular fragment. There is a new displaced fracture fragment of the
medial malleolar tip and lateral and anterior widening of the ankle
mortise. Extensive soft tissue edema about the foot and ankle. No
fracture or dislocation of the proximal left tibia or fibula or left
foot.
IMPRESSION: 1. There is redemonstrated plate and screw fixation of an oblique,
mildly displaced fracture of the distal left tibia and fibula. The
plate is now bent, with new posterior angulation of the distal
fibular fragment.

2. There is a new displaced fracture fragment of the medial
malleolar tip and lateral and anterior widening of the ankle
mortise.

3.  Extensive soft tissue edema about the left foot and ankle.

4. No fracture or dislocation of the proximal left tibia or fibula
or left foot.

## 2019-10-29 DIAGNOSIS — M5416 Radiculopathy, lumbar region: Secondary | ICD-10-CM | POA: Diagnosis not present

## 2019-10-29 DIAGNOSIS — I2510 Atherosclerotic heart disease of native coronary artery without angina pectoris: Secondary | ICD-10-CM | POA: Diagnosis not present

## 2019-10-29 DIAGNOSIS — A4151 Sepsis due to Escherichia coli [E. coli]: Secondary | ICD-10-CM | POA: Diagnosis not present

## 2019-10-29 DIAGNOSIS — N390 Urinary tract infection, site not specified: Secondary | ICD-10-CM | POA: Diagnosis not present

## 2019-10-29 DIAGNOSIS — C9110 Chronic lymphocytic leukemia of B-cell type not having achieved remission: Secondary | ICD-10-CM | POA: Diagnosis not present

## 2019-10-29 DIAGNOSIS — E1142 Type 2 diabetes mellitus with diabetic polyneuropathy: Secondary | ICD-10-CM | POA: Diagnosis not present

## 2019-10-29 DIAGNOSIS — M4712 Other spondylosis with myelopathy, cervical region: Secondary | ICD-10-CM | POA: Diagnosis not present

## 2019-10-29 DIAGNOSIS — C8590 Non-Hodgkin lymphoma, unspecified, unspecified site: Secondary | ICD-10-CM | POA: Diagnosis not present

## 2019-10-29 DIAGNOSIS — I10 Essential (primary) hypertension: Secondary | ICD-10-CM | POA: Diagnosis not present

## 2019-10-30 DIAGNOSIS — G4733 Obstructive sleep apnea (adult) (pediatric): Secondary | ICD-10-CM | POA: Diagnosis not present

## 2019-10-30 DIAGNOSIS — G4737 Central sleep apnea in conditions classified elsewhere: Secondary | ICD-10-CM | POA: Diagnosis not present

## 2019-10-31 DIAGNOSIS — G4733 Obstructive sleep apnea (adult) (pediatric): Secondary | ICD-10-CM | POA: Diagnosis not present

## 2019-11-02 DIAGNOSIS — N281 Cyst of kidney, acquired: Secondary | ICD-10-CM | POA: Diagnosis not present

## 2019-11-02 DIAGNOSIS — R300 Dysuria: Secondary | ICD-10-CM | POA: Diagnosis not present

## 2019-11-02 DIAGNOSIS — N401 Enlarged prostate with lower urinary tract symptoms: Secondary | ICD-10-CM | POA: Diagnosis not present

## 2019-11-02 DIAGNOSIS — N390 Urinary tract infection, site not specified: Secondary | ICD-10-CM | POA: Diagnosis not present

## 2019-11-02 DIAGNOSIS — R350 Frequency of micturition: Secondary | ICD-10-CM | POA: Diagnosis not present

## 2019-11-02 DIAGNOSIS — R509 Fever, unspecified: Secondary | ICD-10-CM | POA: Diagnosis not present

## 2019-11-02 DIAGNOSIS — R309 Painful micturition, unspecified: Secondary | ICD-10-CM | POA: Diagnosis not present

## 2019-11-07 DIAGNOSIS — R410 Disorientation, unspecified: Secondary | ICD-10-CM | POA: Diagnosis not present

## 2019-11-07 DIAGNOSIS — A419 Sepsis, unspecified organism: Secondary | ICD-10-CM | POA: Diagnosis not present

## 2019-11-07 DIAGNOSIS — R29898 Other symptoms and signs involving the musculoskeletal system: Secondary | ICD-10-CM | POA: Diagnosis not present

## 2019-11-07 DIAGNOSIS — E669 Obesity, unspecified: Secondary | ICD-10-CM | POA: Diagnosis not present

## 2019-11-07 DIAGNOSIS — I10 Essential (primary) hypertension: Secondary | ICD-10-CM | POA: Diagnosis not present

## 2019-11-07 DIAGNOSIS — Z7409 Other reduced mobility: Secondary | ICD-10-CM | POA: Diagnosis not present

## 2019-11-07 DIAGNOSIS — N390 Urinary tract infection, site not specified: Secondary | ICD-10-CM | POA: Diagnosis not present

## 2019-11-07 DIAGNOSIS — R000 Tachycardia, unspecified: Secondary | ICD-10-CM | POA: Diagnosis not present

## 2019-11-07 DIAGNOSIS — R0689 Other abnormalities of breathing: Secondary | ICD-10-CM | POA: Diagnosis not present

## 2019-11-07 DIAGNOSIS — R4182 Altered mental status, unspecified: Secondary | ICD-10-CM | POA: Diagnosis not present

## 2019-11-07 DIAGNOSIS — R509 Fever, unspecified: Secondary | ICD-10-CM | POA: Diagnosis not present

## 2019-11-07 DIAGNOSIS — R531 Weakness: Secondary | ICD-10-CM | POA: Diagnosis not present

## 2019-11-07 DIAGNOSIS — R300 Dysuria: Secondary | ICD-10-CM | POA: Diagnosis not present

## 2019-11-07 DIAGNOSIS — E119 Type 2 diabetes mellitus without complications: Secondary | ICD-10-CM | POA: Diagnosis not present

## 2019-11-07 DIAGNOSIS — M109 Gout, unspecified: Secondary | ICD-10-CM | POA: Diagnosis not present

## 2019-11-07 DIAGNOSIS — N401 Enlarged prostate with lower urinary tract symptoms: Secondary | ICD-10-CM | POA: Diagnosis not present

## 2019-11-07 DIAGNOSIS — K219 Gastro-esophageal reflux disease without esophagitis: Secondary | ICD-10-CM | POA: Diagnosis not present

## 2019-11-07 DIAGNOSIS — I2510 Atherosclerotic heart disease of native coronary artery without angina pectoris: Secondary | ICD-10-CM | POA: Diagnosis not present

## 2019-11-07 DIAGNOSIS — R9431 Abnormal electrocardiogram [ECG] [EKG]: Secondary | ICD-10-CM | POA: Diagnosis not present

## 2019-11-07 DIAGNOSIS — Z6830 Body mass index (BMI) 30.0-30.9, adult: Secondary | ICD-10-CM | POA: Diagnosis not present

## 2019-11-08 ENCOUNTER — Other Ambulatory Visit: Payer: Self-pay | Admitting: Radiology

## 2019-11-08 DIAGNOSIS — N281 Cyst of kidney, acquired: Secondary | ICD-10-CM | POA: Diagnosis not present

## 2019-11-08 DIAGNOSIS — N289 Disorder of kidney and ureter, unspecified: Secondary | ICD-10-CM | POA: Diagnosis not present

## 2019-11-08 DIAGNOSIS — N390 Urinary tract infection, site not specified: Secondary | ICD-10-CM | POA: Diagnosis not present

## 2019-11-08 DIAGNOSIS — E119 Type 2 diabetes mellitus without complications: Secondary | ICD-10-CM | POA: Diagnosis not present

## 2019-11-08 DIAGNOSIS — A419 Sepsis, unspecified organism: Secondary | ICD-10-CM | POA: Diagnosis not present

## 2019-11-08 DIAGNOSIS — K219 Gastro-esophageal reflux disease without esophagitis: Secondary | ICD-10-CM | POA: Diagnosis not present

## 2019-11-08 DIAGNOSIS — I10 Essential (primary) hypertension: Secondary | ICD-10-CM | POA: Diagnosis not present

## 2019-11-08 DIAGNOSIS — I2510 Atherosclerotic heart disease of native coronary artery without angina pectoris: Secondary | ICD-10-CM | POA: Diagnosis not present

## 2019-11-08 DIAGNOSIS — M109 Gout, unspecified: Secondary | ICD-10-CM | POA: Diagnosis not present

## 2019-11-09 DIAGNOSIS — N289 Disorder of kidney and ureter, unspecified: Secondary | ICD-10-CM | POA: Diagnosis not present

## 2019-11-09 DIAGNOSIS — A419 Sepsis, unspecified organism: Secondary | ICD-10-CM | POA: Diagnosis not present

## 2019-11-09 DIAGNOSIS — N281 Cyst of kidney, acquired: Secondary | ICD-10-CM | POA: Diagnosis not present

## 2019-11-09 DIAGNOSIS — Q6102 Congenital multiple renal cysts: Secondary | ICD-10-CM | POA: Diagnosis not present

## 2019-11-09 DIAGNOSIS — N390 Urinary tract infection, site not specified: Secondary | ICD-10-CM | POA: Diagnosis not present

## 2019-11-10 DIAGNOSIS — R5381 Other malaise: Secondary | ICD-10-CM | POA: Diagnosis not present

## 2019-11-10 DIAGNOSIS — N390 Urinary tract infection, site not specified: Secondary | ICD-10-CM | POA: Diagnosis not present

## 2019-11-10 DIAGNOSIS — A419 Sepsis, unspecified organism: Secondary | ICD-10-CM | POA: Diagnosis not present

## 2019-11-10 DIAGNOSIS — Q6102 Congenital multiple renal cysts: Secondary | ICD-10-CM | POA: Diagnosis not present

## 2019-11-10 DIAGNOSIS — Z7401 Bed confinement status: Secondary | ICD-10-CM | POA: Diagnosis not present

## 2019-11-10 DIAGNOSIS — N289 Disorder of kidney and ureter, unspecified: Secondary | ICD-10-CM | POA: Diagnosis not present

## 2019-11-10 DIAGNOSIS — M2550 Pain in unspecified joint: Secondary | ICD-10-CM | POA: Diagnosis not present

## 2019-11-12 ENCOUNTER — Ambulatory Visit (HOSPITAL_COMMUNITY): Admission: RE | Admit: 2019-11-12 | Payer: Medicare HMO | Source: Ambulatory Visit

## 2019-11-14 DIAGNOSIS — M5416 Radiculopathy, lumbar region: Secondary | ICD-10-CM | POA: Diagnosis not present

## 2019-11-14 DIAGNOSIS — N390 Urinary tract infection, site not specified: Secondary | ICD-10-CM | POA: Diagnosis not present

## 2019-11-14 DIAGNOSIS — C8590 Non-Hodgkin lymphoma, unspecified, unspecified site: Secondary | ICD-10-CM | POA: Diagnosis not present

## 2019-11-14 DIAGNOSIS — I2510 Atherosclerotic heart disease of native coronary artery without angina pectoris: Secondary | ICD-10-CM | POA: Diagnosis not present

## 2019-11-14 DIAGNOSIS — C9110 Chronic lymphocytic leukemia of B-cell type not having achieved remission: Secondary | ICD-10-CM | POA: Diagnosis not present

## 2019-11-14 DIAGNOSIS — A4151 Sepsis due to Escherichia coli [E. coli]: Secondary | ICD-10-CM | POA: Diagnosis not present

## 2019-11-14 DIAGNOSIS — I10 Essential (primary) hypertension: Secondary | ICD-10-CM | POA: Diagnosis not present

## 2019-11-14 DIAGNOSIS — M4712 Other spondylosis with myelopathy, cervical region: Secondary | ICD-10-CM | POA: Diagnosis not present

## 2019-11-14 DIAGNOSIS — E1142 Type 2 diabetes mellitus with diabetic polyneuropathy: Secondary | ICD-10-CM | POA: Diagnosis not present

## 2019-11-15 DIAGNOSIS — G4733 Obstructive sleep apnea (adult) (pediatric): Secondary | ICD-10-CM | POA: Diagnosis not present

## 2019-11-17 DIAGNOSIS — A4151 Sepsis due to Escherichia coli [E. coli]: Secondary | ICD-10-CM | POA: Diagnosis not present

## 2019-11-17 DIAGNOSIS — M5417 Radiculopathy, lumbosacral region: Secondary | ICD-10-CM | POA: Diagnosis not present

## 2019-11-17 DIAGNOSIS — M4712 Other spondylosis with myelopathy, cervical region: Secondary | ICD-10-CM | POA: Diagnosis not present

## 2019-11-17 DIAGNOSIS — N390 Urinary tract infection, site not specified: Secondary | ICD-10-CM | POA: Diagnosis not present

## 2019-11-17 DIAGNOSIS — I25118 Atherosclerotic heart disease of native coronary artery with other forms of angina pectoris: Secondary | ICD-10-CM | POA: Diagnosis not present

## 2019-11-17 DIAGNOSIS — I10 Essential (primary) hypertension: Secondary | ICD-10-CM | POA: Diagnosis not present

## 2019-11-17 DIAGNOSIS — E1142 Type 2 diabetes mellitus with diabetic polyneuropathy: Secondary | ICD-10-CM | POA: Diagnosis not present

## 2019-11-17 DIAGNOSIS — C9110 Chronic lymphocytic leukemia of B-cell type not having achieved remission: Secondary | ICD-10-CM | POA: Diagnosis not present

## 2019-11-17 DIAGNOSIS — C8590 Non-Hodgkin lymphoma, unspecified, unspecified site: Secondary | ICD-10-CM | POA: Diagnosis not present

## 2019-11-19 DIAGNOSIS — G4733 Obstructive sleep apnea (adult) (pediatric): Secondary | ICD-10-CM | POA: Diagnosis not present

## 2019-11-20 ENCOUNTER — Encounter: Payer: Medicare HMO | Admitting: Neurology

## 2019-11-20 ENCOUNTER — Encounter: Payer: Self-pay | Admitting: Neurology

## 2019-11-20 ENCOUNTER — Telehealth: Payer: Self-pay | Admitting: *Deleted

## 2019-11-20 NOTE — Telephone Encounter (Signed)
No showed NCV/EMG appointment. 

## 2019-11-22 DIAGNOSIS — E1142 Type 2 diabetes mellitus with diabetic polyneuropathy: Secondary | ICD-10-CM | POA: Diagnosis not present

## 2019-11-22 DIAGNOSIS — C9110 Chronic lymphocytic leukemia of B-cell type not having achieved remission: Secondary | ICD-10-CM | POA: Diagnosis not present

## 2019-11-22 DIAGNOSIS — N390 Urinary tract infection, site not specified: Secondary | ICD-10-CM | POA: Diagnosis not present

## 2019-11-22 DIAGNOSIS — A4151 Sepsis due to Escherichia coli [E. coli]: Secondary | ICD-10-CM | POA: Diagnosis not present

## 2019-11-22 DIAGNOSIS — M4712 Other spondylosis with myelopathy, cervical region: Secondary | ICD-10-CM | POA: Diagnosis not present

## 2019-11-22 DIAGNOSIS — C8590 Non-Hodgkin lymphoma, unspecified, unspecified site: Secondary | ICD-10-CM | POA: Diagnosis not present

## 2019-11-22 DIAGNOSIS — M5417 Radiculopathy, lumbosacral region: Secondary | ICD-10-CM | POA: Diagnosis not present

## 2019-11-22 DIAGNOSIS — I10 Essential (primary) hypertension: Secondary | ICD-10-CM | POA: Diagnosis not present

## 2019-11-22 DIAGNOSIS — I25118 Atherosclerotic heart disease of native coronary artery with other forms of angina pectoris: Secondary | ICD-10-CM | POA: Diagnosis not present

## 2019-11-27 ENCOUNTER — Other Ambulatory Visit: Payer: Self-pay | Admitting: Radiology

## 2019-11-28 ENCOUNTER — Ambulatory Visit (HOSPITAL_COMMUNITY)
Admission: RE | Admit: 2019-11-28 | Discharge: 2019-11-28 | Disposition: A | Discharge status: Home / Self Care | Payer: Medicare HMO | Source: Ambulatory Visit | Attending: General Surgery | Admitting: General Surgery

## 2019-11-28 ENCOUNTER — Other Ambulatory Visit: Payer: Self-pay

## 2019-11-28 ENCOUNTER — Encounter (HOSPITAL_COMMUNITY): Payer: Self-pay

## 2019-11-28 ENCOUNTER — Other Ambulatory Visit (HOSPITAL_COMMUNITY): Payer: Self-pay | Admitting: General Surgery

## 2019-11-28 DIAGNOSIS — Z981 Arthrodesis status: Secondary | ICD-10-CM | POA: Insufficient documentation

## 2019-11-28 DIAGNOSIS — Z87891 Personal history of nicotine dependence: Secondary | ICD-10-CM | POA: Insufficient documentation

## 2019-11-28 DIAGNOSIS — N281 Cyst of kidney, acquired: Secondary | ICD-10-CM

## 2019-11-28 DIAGNOSIS — Z79899 Other long term (current) drug therapy: Secondary | ICD-10-CM | POA: Insufficient documentation

## 2019-11-28 DIAGNOSIS — I10 Essential (primary) hypertension: Secondary | ICD-10-CM | POA: Insufficient documentation

## 2019-11-28 DIAGNOSIS — I2510 Atherosclerotic heart disease of native coronary artery without angina pectoris: Secondary | ICD-10-CM | POA: Diagnosis not present

## 2019-11-28 DIAGNOSIS — Z7984 Long term (current) use of oral hypoglycemic drugs: Secondary | ICD-10-CM | POA: Diagnosis not present

## 2019-11-28 DIAGNOSIS — Z8249 Family history of ischemic heart disease and other diseases of the circulatory system: Secondary | ICD-10-CM | POA: Insufficient documentation

## 2019-11-28 DIAGNOSIS — K219 Gastro-esophageal reflux disease without esophagitis: Secondary | ICD-10-CM | POA: Insufficient documentation

## 2019-11-28 DIAGNOSIS — M109 Gout, unspecified: Secondary | ICD-10-CM | POA: Diagnosis not present

## 2019-11-28 DIAGNOSIS — Z833 Family history of diabetes mellitus: Secondary | ICD-10-CM | POA: Diagnosis not present

## 2019-11-28 DIAGNOSIS — G4733 Obstructive sleep apnea (adult) (pediatric): Secondary | ICD-10-CM | POA: Insufficient documentation

## 2019-11-28 DIAGNOSIS — E785 Hyperlipidemia, unspecified: Secondary | ICD-10-CM | POA: Diagnosis not present

## 2019-11-28 DIAGNOSIS — D3002 Benign neoplasm of left kidney: Secondary | ICD-10-CM | POA: Diagnosis not present

## 2019-11-28 DIAGNOSIS — E1142 Type 2 diabetes mellitus with diabetic polyneuropathy: Secondary | ICD-10-CM | POA: Diagnosis not present

## 2019-11-28 DIAGNOSIS — Z7982 Long term (current) use of aspirin: Secondary | ICD-10-CM | POA: Insufficient documentation

## 2019-11-28 DIAGNOSIS — F068 Other specified mental disorders due to known physiological condition: Secondary | ICD-10-CM | POA: Diagnosis not present

## 2019-11-28 HISTORY — PX: IR RENAL CYST: IMG2376

## 2019-11-28 LAB — GLUCOSE, CAPILLARY: Glucose-Capillary: 124 mg/dL — ABNORMAL HIGH (ref 70–99)

## 2019-11-28 LAB — CBC
HCT: 35.4 % — ABNORMAL LOW (ref 39.0–52.0)
Hemoglobin: 12.3 g/dL — ABNORMAL LOW (ref 13.0–17.0)
MCH: 30.4 pg (ref 26.0–34.0)
MCHC: 34.7 g/dL (ref 30.0–36.0)
MCV: 87.6 fL (ref 80.0–100.0)
Platelets: 139 10*3/uL — ABNORMAL LOW (ref 150–400)
RBC: 4.04 MIL/uL — ABNORMAL LOW (ref 4.22–5.81)
RDW: 13.2 % (ref 11.5–15.5)
WBC: 3 10*3/uL — ABNORMAL LOW (ref 4.0–10.5)
nRBC: 0 % (ref 0.0–0.2)

## 2019-11-28 LAB — PROTIME-INR
INR: 1 (ref 0.8–1.2)
Prothrombin Time: 12.9 seconds (ref 11.4–15.2)

## 2019-11-28 MED ORDER — LIDOCAINE HCL 1 % IJ SOLN
INTRAMUSCULAR | Status: AC
  Filled 2019-11-28: qty 20

## 2019-11-28 MED ORDER — IOHEXOL 300 MG/ML  SOLN
50.0000 mL | Freq: Once | INTRAMUSCULAR | Status: AC | PRN
  Administered 2019-11-28: 45 mL

## 2019-11-28 MED ORDER — FENTANYL CITRATE (PF) 100 MCG/2ML IJ SOLN
INTRAMUSCULAR | Status: AC | PRN
  Administered 2019-11-28 (×4): 25 ug via INTRAVENOUS
  Administered 2019-11-28: 50 ug via INTRAVENOUS
  Administered 2019-11-28: 25 ug via INTRAVENOUS

## 2019-11-28 MED ORDER — MIDAZOLAM HCL 2 MG/2ML IJ SOLN
INTRAMUSCULAR | Status: AC | PRN
  Administered 2019-11-28: 1 mg via INTRAVENOUS
  Administered 2019-11-28: 0.5 mg via INTRAVENOUS
  Administered 2019-11-28: 1 mg via INTRAVENOUS
  Administered 2019-11-28 (×3): 0.5 mg via INTRAVENOUS

## 2019-11-28 MED ORDER — SODIUM CHLORIDE 0.9 % IV SOLN: INTRAVENOUS | Status: DC

## 2019-11-28 MED ORDER — FENTANYL CITRATE (PF) 100 MCG/2ML IJ SOLN
INTRAMUSCULAR | Status: AC
  Filled 2019-11-28: qty 4

## 2019-11-28 MED ORDER — MIDAZOLAM HCL 2 MG/2ML IJ SOLN
INTRAMUSCULAR | Status: AC
  Filled 2019-11-28: qty 6

## 2019-11-28 NOTE — Progress Notes (Signed)
Pt is dressed stable and ready to be discharged. Waiting for his wife to pick him up

## 2019-11-28 NOTE — Discharge Instructions (Addendum)
Needle Biopsy, Care After These instructions tell you how to care for yourself after your procedure. Your doctor may also give you more specific instructions. Call your doctor if you have any problems or questions. What can I expect after the procedure? After the procedure, it is common to have:  Soreness.  Bruising.  Mild pain. Follow these instructions at home:   Return to your normal activities as told by your doctor. Ask your doctor what activities are safe for you.  Take over-the-counter and prescription medicines only as told by your doctor.  Wash your hands with soap and water before you change your bandage (dressing). If you cannot use soap and water, use hand sanitizer.  Follow instructions from your doctor about: ? How to take care of your puncture site. ? When and how to change your bandage. ? When to remove your bandage.  Check your puncture site every day for signs of infection. Watch for: ? Redness, swelling, or pain. ? Fluid or blood. ? Pus or a bad smell. ? Warmth.  Do not take baths, swim, or use a hot tub until your doctor approves. Ask your doctor if you may take showers. You may only be allowed to take sponge baths.  Keep all follow-up visits as told by your doctor. This is important. Contact a doctor if you have:  A fever.  Redness, swelling, or pain at the puncture site, and it lasts longer than a few days.  Fluid, blood, or pus coming from the puncture site.  Warmth coming from the puncture site. Get help right away if:  You have a lot of bleeding from the puncture site. Summary  After the procedure, it is common to have soreness, bruising, or mild pain at the puncture site.  Check your puncture site every day for signs of infection, such as redness, swelling, or pain.  Get help right away if you have severe bleeding from your puncture site. This information is not intended to replace advice given to you by your health care provider. Make  sure you discuss any questions you have with your health care provider. Document Revised: 05/23/2017 Document Reviewed: 05/23/2017 Elsevier Patient Education  2020 [employer] Moderate Conscious Sedation, Adult Sedation is the use of medicines to promote relaxation and relieve discomfort and anxiety. Moderate conscious sedation is a type of sedation. Under moderate conscious sedation, you are less alert than normal, but you are still able to respond to instructions, touch, or both. Moderate conscious sedation is used during short medical and dental procedures. It is milder than deep sedation, which is a type of sedation under which you cannot be easily woken up. It is also milder than general anesthesia, which is the use of medicines to make you unconscious. Moderate conscious sedation allows you to return to your regular activities sooner. Tell a health care provider about:  Any allergies you have.  All medicines you are taking, including vitamins, herbs, eye drops, creams, and over-the-counter medicines.  Use of steroids (by mouth or creams).  Any problems you or family members have had with sedatives and anesthetic medicines.  Any blood disorders you have.  Any surgeries you have had.  Any medical conditions you have, such as sleep apnea.  Whether you are pregnant or may be pregnant.  Any use of cigarettes, alcohol, marijuana, or street drugs. What are the risks? Generally, this is a safe procedure. However, problems may occur, including:  Getting too much medicine (oversedation).  Nausea.  Allergic reaction to   medicines.  Trouble breathing. If this happens, a breathing tube may be used to help with breathing. It will be removed when you are awake and breathing on your own.  Heart trouble.  Lung trouble. What happens before the procedure? Staying hydrated Follow instructions from your health care provider about hydration, which may include:  Up to 2 hours before the  procedure - you may continue to drink clear liquids, such as water, clear fruit juice, black coffee, and plain tea. Eating and drinking restrictions Follow instructions from your health care provider about eating and drinking, which may include:  8 hours before the procedure - stop eating heavy meals or foods such as meat, fried foods, or fatty foods.  6 hours before the procedure - stop eating light meals or foods, such as toast or cereal.  6 hours before the procedure - stop drinking milk or drinks that contain milk.  2 hours before the procedure - stop drinking clear liquids. Medicine Ask your health care provider about:  Changing or stopping your regular medicines. This is especially important if you are taking diabetes medicines or blood thinners.  Taking medicines such as aspirin and ibuprofen. These medicines can thin your blood. Do not take these medicines before your procedure if your health care provider instructs you not to.  Tests and exams  You will have a physical exam.  You may have blood tests done to show: ? How well your kidneys and liver are working. ? How well your blood can clot. General instructions  Plan to have someone take you home from the hospital or clinic.  If you will be going home right after the procedure, plan to have someone with you for 24 hours. What happens during the procedure?  An IV tube will be inserted into one of your veins.  Medicine to help you relax (sedative) will be given through the IV tube.  The medical or dental procedure will be performed. What happens after the procedure?  Your blood pressure, heart rate, breathing rate, and blood oxygen level will be monitored often until the medicines you were given have worn off.  Do not drive for 24 hours. This information is not intended to replace advice given to you by your health care provider. Make sure you discuss any questions you have with your health care provider. Document  Revised: 04/22/2017 Document Reviewed: 08/30/2015 Elsevier Patient Education  2020 Else[employer] Inc.  

## 2019-11-28 NOTE — Procedures (Signed)
Interventional Radiology Procedure Note  Procedure: US guided left renal cyst aspiration x 4   Complications: None  Estimated Blood Loss: < 10 mL  Findings: Renal cyst aspiration x 4 of left kidney. #1 upper pole: 860 mL #2 upper pole: 225 mL #3 lower pole: 1500 mL #4 lower pole: 550 mL All cysts completely decompressed with aspiration via Yueh catheters. Contrast injection demonstrated collapsed cyst cavities with no communication to collecting system. Fluid samples from each cyst sent for culture and cytology.  Venetia Night. Kathlene Cote, M.D Pager:  [phone]

## 2019-11-28 NOTE — H&P (Signed)
Chief Complaint: Renal cyst  Referring Physician(s): Puschinsky,Richard W.  Supervising Physician: Aletta Edouard  Patient Status: [facility] - Out-pt  History of Present Illness: Ronald Rice is a 74 y.o. male known longstanding multiple large cysts of the left kidney and associated decreased function of the left kidney.    He was seen by Dr. Kathlene Cote via telephone visit on 10/16/19 for consideration of aspiration.  His renal function is normal.    The 2 largest cysts yielded a total volume of 3 L of fluid with aspiration on 05/23/2018 with 20 cm upper pole and 16 cm lower pole cysts present at that time as well as several other smaller cysts.    Cytologic analysis and culture of the fluid from the cysts were negative.  He was hospitalized in April for urosepsis from E. Coli.  PET scan done 10/16/19 for follow-up of non-Hodgkin's lymphoma ordered by Dr. Marin Olp showed the upper pole cyst to currently measure approximately 13 cm in greatest diameter and the lower pole cyst 17 cm.    There are 3-4 other moderate sized cysts present measuring between 4.5-8.5 cm.    The upper pole cyst is partially rim calcified which is a chronic appearance.  There is no significant left-sided hydronephrosis.    The right kidney demonstrates 4 cysts with the largest measuring 5.5 cm.  He is asymptomatic with respect to the large left renal cysts.  He denies any left abdominal or flank pain.  He is here today for aspiration. He is NPO.  Past Medical History:  Diagnosis Date  . Cancer ([place])    Leukemia   . Cervical myelopathy ([place])   . Chronic sinusitis   . CLL (chronic lymphocytic leukemia) ([place])    see records in care everywhere from [place]  . Coronary artery disease    minimal nonobstructive by 10/31/16 cath at [facility] in [place]  . Diabetes mellitus without complication ([place])   . Diffuse large B-cell lymphoma of lymph nodes of multiple regions ([place]) 10/31/2018  . Diverticulosis   .  Erectile dysfunction   . Fracture    left ankle fracture with retained hardware  . GERD (gastroesophageal reflux disease)   . Goals of care, counseling/discussion 10/31/2018  . Gout   . Hyperlipemia   . Hypertension   . Lumbar radiculopathy   . Memory loss   . OSA (obstructive sleep apnea)   . Peripheral neuropathy   . Renal cyst   . Sleep apnea    does not wear CPAP  . Thyroid nodule   . Wears dentures   . Wears glasses     Past Surgical History:  Procedure Laterality Date  . ANKLE RECONSTRUCTION Left 12/19/2018   Procedure: LEFT ANKLE REVISION FIXATION, REMOVAL OF HARDWARE, OPEN TREATMENT, SYNDESMOSIS, POSSIBLE ANKLE ARTHROTOMY, DELTOID LIGAMENT REPAIR;  Surgeon: Erle Crocker, MD;  Location: [place];  Service: Orthopedics;  Laterality: Left;  . ANTERIOR CERVICAL DECOMP/DISCECTOMY FUSION N/A 04/20/2017   Procedure: ANTERIOR CERVICAL DECOMPRESSION FUSION, CERVICAL 3-4, CERVICAL 4-5 WITH INSTRUMENTATION AND ALLOGRAFT; REQUEST 3.5 HOURS;  Surgeon: Phylliss Bob, MD;  Location: [place];  Service: Orthopedics;  Laterality: N/A;  ANTERIOR CERVICAL DECOMPRESSION FUSION, CERVICAL 3-4, CERVICAL 4-5 WITH INSTRUMENTATION AND ALLOGRAFT; REQUEST 3.5 HOURS  . BACK SURGERY    . CARDIAC CATHETERIZATION     in Care Everywhere 11/01/16  . COLONOSCOPY    . IR IMAGING GUIDED PORT INSERTION  11/10/2018  . IR RADIOLOGIST EVAL & MGMT  10/16/2019  . MULTIPLE TOOTH  EXTRACTIONS    . ORIF ANKLE FRACTURE Left 09/13/2018   Procedure: OPEN REDUCTION INTERNAL FIXATION (ORIF) LATERAL LEFT ANKLE FRACTURE;  Surgeon: Dorna Leitz, MD;  Location: [place];  Service: Orthopedics;  Laterality: Left;  . SYNDESMOSIS REPAIR Left 09/13/2018   Procedure: OPEN REDUCTION INTERNAL FIXATION SYNDESMOSIS REPAIR LEFT ANKLE;  Surgeon: Dorna Leitz, MD;  Location: [place];  Service: Orthopedics;  Laterality: Left;    Allergies: Patient has no known allergies.  Medications: Prior to Admission medications   Medication Sig Start Date  End Date Taking? Authorizing Provider  acetaminophen (TYLENOL) 500 MG tablet Take 1,000 mg by mouth every 6 (six) hours as needed (pain).     [provider]  allopurinol (ZYLOPRIM) 300 MG tablet Take 300 mg by mouth daily.    [provider]  aspirin EC 81 MG tablet Take 81 mg by mouth daily.    [provider]  atenolol (TENORMIN) 25 MG tablet Take 25 mg by mouth daily.     [provider]  atorvastatin (LIPITOR) 40 MG tablet Take 40 mg by mouth daily.    [provider]  Cholecalciferol (VITAMIN D3 PO) Take 1 tablet by mouth daily.    [provider]  CINNAMON PO Take 1 tablet by mouth daily.    [provider]  diclofenac sodium (VOLTAREN) 1 % GEL Apply 1 application topically as needed (pain).  10/10/18   [provider]  donepezil (ARICEPT ODT) 5 MG disintegrating tablet Take 5 mg by mouth at bedtime. 03/29/19   [provider]  DULoxetine (CYMBALTA) 60 MG capsule Take 1 capsule (60 mg total) by mouth daily. 09/13/19   Volanda Napoleon, MD  famotidine (PEPCID) 20 MG tablet Take 20 mg by mouth at bedtime as needed for heartburn.     [provider]  ipratropium (ATROVENT) 0.06 % nasal spray Place 1 spray into both nostrils daily as needed for rhinitis.  08/29/19   [provider]  Lancets (ONETOUCH DELICA PLUS [id number]) [place]  09/27/19   [provider]  lidocaine-prilocaine (EMLA) cream Apply to PAC 1 hour prior to procedure. 01/03/19   Volanda Napoleon, MD  lisinopril (ZESTRIL) 40 MG tablet Take 1 tablet (40 mg total) by mouth daily for 30 days. 10/29/18 12/20/27  Donne Hazel, MD  metFORMIN (GLUCOPHAGE-XR) 500 MG 24 hr tablet Take 500 mg by mouth every evening.  11/28/18   [provider]  NON FORMULARY Take 2 tablets by mouth daily. Blood Sugar Defense     [provider]  potassium chloride (K-DUR) 10 MEQ tablet Take 10 mEq by mouth daily.     [provider]    pyridoxine (B-6) 250 MG tablet Take 250 mg by mouth daily.    [provider]  tamsulosin (FLOMAX) 0.4 MG CAPS capsule Take 0.4 mg by mouth daily.    [provider]  traMADol (ULTRAM) 50 MG tablet TAKE 1 TABLET BY MOUTH EVERY 6 HOURS AS NEEDED Patient taking differently: Take 50 mg by mouth every 6 (six) hours as needed for moderate pain.  10/25/19   Volanda Napoleon, MD     Family History  Problem Relation Age of Onset  . Heart attack Mother   . Stroke Father   . Diabetes Sister   . Diabetes Brother     Social History   Socioeconomic History  . Marital status: Divorced    Spouse name: Not on file  . Number of children: 2  .  Years of education: 22  . Highest education level: Not on file  Occupational History  . Occupation: Retired  Tobacco Use  . Smoking status: Former Smoker    Types: Pipe, Cigars, Cigarettes    Quit date: 10/31/1989    Years since quitting: 30.0  . Smokeless tobacco: Never Used  Vaping Use  . Vaping Use: Never used  Substance and Sexual Activity  . Alcohol use: Yes    Comment: occasional  . Drug use: No  . Sexual activity: Not on file  Other Topics Concern  . Not on file  Social History Narrative   Lives with daughter.   Caffeine use: Drinks coffee daily (1-2 cups)   Right handed    Social Determinants of Health   Financial Resource Strain:   . Difficulty of Paying Living Expenses:   Food Insecurity:   . Worried About Charity fundraiser in the Last Year:   . Arboriculturist in the Last Year:   Transportation Needs:   . Film/video editor (Medical):   Marland Kitchen Lack of Transportation (Non-Medical):   Physical Activity:   . Days of Exercise per Week:   . Minutes of Exercise per Session:   Stress:   . Feeling of Stress :   Social Connections:   . Frequency of Communication with Friends and Family:   . Frequency of Social Gatherings with Friends and Family:   . Attends Religious Services:   . Active Member of Clubs or  Organizations:   . Attends Archivist Meetings:   Marland Kitchen Marital Status:      Review of Systems: A 12 point ROS discussed and pertinent positives are indicated in the HPI above.  All other systems are negative.  Review of Systems  Vital Signs: BP (!) 156/102   Pulse 70   Temp 97.8 F (36.6 C) (Oral)   Resp 17   Ht 6\' 2"  (1.88 m)   Wt 127 kg   SpO2 100%   BMI 35.95 kg/m   Physical Exam Vitals reviewed.  Constitutional:      Appearance: He is obese.  HENT:     Head: Normocephalic and atraumatic.  Eyes:     Extraocular Movements: Extraocular movements intact.  Cardiovascular:     Rate and Rhythm: Normal rate and regular rhythm.  Pulmonary:     Effort: Pulmonary effort is normal. No respiratory distress.     Breath sounds: Normal breath sounds.  Abdominal:     General: There is no distension.     Palpations: Abdomen is soft.  Musculoskeletal:        General: Normal range of motion.     Cervical back: Normal range of motion.  Skin:    General: Skin is warm and dry.  Neurological:     General: No focal deficit present.     Mental Status: He is alert and oriented to person, place, and time.  Psychiatric:        Mood and Affect: Mood normal.        Behavior: Behavior normal.        Thought Content: Thought content normal.        Judgment: Judgment normal.     Imaging: No results found.  Labs:  CBC: Recent Labs    06/12/19 0854 07/23/19 1203 09/13/19 0820 10/18/19 0915  WBC 3.4* 3.1* 3.9* 3.5*  HGB 13.5 14.1 13.8 13.0  HCT 38.5* 39.2 38.6* 36.4*  PLT 169 169 249 161  COAGS: No results for input(s): INR, APTT in the last 8760 hours.  BMP: Recent Labs    06/12/19 0854 07/23/19 1203 09/13/19 0820 10/18/19 0915  NA 139 141 141 142  K 3.3* 3.8 4.0 3.8  CL 103 105 104 107  CO2 27 29 28 26   GLUCOSE 258* 255* 185* 198*  BUN 11 9 11 14   CALCIUM 9.7 9.5 9.3 9.2  CREATININE 1.16 1.05 1.16 0.98  GFRNONAA >60 >60 >60 >60  GFRAA >60 >60 >60  >60    LIVER FUNCTION TESTS: Recent Labs    06/12/19 0854 07/23/19 1203 09/13/19 0820 10/18/19 0915  BILITOT 0.5 0.5 0.5 0.4  AST 20 16 17 15   ALT 23 21 23 16   ALKPHOS 61 67 67 66  PROT 6.5 6.4* 6.4* 6.3*  ALBUMIN 4.3 4.3 4.1 4.1    TUMOR MARKERS: No results for input(s): AFPTM, CEA, CA199, CHROMGRNA in the last 8760 hours.  Assessment and Plan:  Recurrent renal cysts.  Dr.Yamagata's consult note reads = His previous history is somewhat strange to implicate one or more large left renal cysts for recurrent infection.  Rather than trying to sclerose the largest cyst with alcohol which may or may not be very effective at that large of a cyst volume, a better approach may be to individually decompress cysts and analyze each separately for infection as well as inject them under fluoroscopy with contrast to see whether they communicate with the renal collecting system.  This would allow a more selective approach to potential future cyst sclerosis with alcohol, if indicated.  At the volume of these large cysts, a somewhat reduced volume of alcohol would have to be utilized because of the risk of some alcohol absorption and this would decrease the effectiveness of sclerosing the entire inner cyst lining."  Will proceed with image guided cyst aspiration with contrast injection under fluoroscopy today by Dr. Kathlene Cote.  Risks and benefits discussed with the patient including bleeding, infection, damage to adjacent structures, and sepsis.  All of the patient's questions were answered, patient is agreeable to proceed. Consent signed and in chart.  Electronically Signed: Murrell Redden, PA-C   11/28/2019, 11:05 AM      I spent a total of    15 Minutes in face to face in clinical consultation, greater than 50% of which was counseling/coordinating care for renal cyst aspiration.

## 2019-11-28 NOTE — Sedation Documentation (Signed)
Patient is resting comfortably. 

## 2019-11-29 DIAGNOSIS — G4737 Central sleep apnea in conditions classified elsewhere: Secondary | ICD-10-CM | POA: Diagnosis not present

## 2019-11-29 DIAGNOSIS — G4733 Obstructive sleep apnea (adult) (pediatric): Secondary | ICD-10-CM | POA: Diagnosis not present

## 2019-11-29 LAB — CYTOLOGY - NON PAP

## 2019-12-03 LAB — AEROBIC/ANAEROBIC CULTURE W GRAM STAIN (SURGICAL/DEEP WOUND)
Culture: NO GROWTH
Culture: NO GROWTH
Culture: NO GROWTH
Culture: NO GROWTH
Gram Stain: NONE SEEN
Gram Stain: NONE SEEN

## 2019-12-05 DIAGNOSIS — M5417 Radiculopathy, lumbosacral region: Secondary | ICD-10-CM | POA: Diagnosis not present

## 2019-12-05 DIAGNOSIS — I25118 Atherosclerotic heart disease of native coronary artery with other forms of angina pectoris: Secondary | ICD-10-CM | POA: Diagnosis not present

## 2019-12-05 DIAGNOSIS — I10 Essential (primary) hypertension: Secondary | ICD-10-CM | POA: Diagnosis not present

## 2019-12-05 DIAGNOSIS — M4712 Other spondylosis with myelopathy, cervical region: Secondary | ICD-10-CM | POA: Diagnosis not present

## 2019-12-05 DIAGNOSIS — C8590 Non-Hodgkin lymphoma, unspecified, unspecified site: Secondary | ICD-10-CM | POA: Diagnosis not present

## 2019-12-05 DIAGNOSIS — C9110 Chronic lymphocytic leukemia of B-cell type not having achieved remission: Secondary | ICD-10-CM | POA: Diagnosis not present

## 2019-12-05 DIAGNOSIS — A4151 Sepsis due to Escherichia coli [E. coli]: Secondary | ICD-10-CM | POA: Diagnosis not present

## 2019-12-05 DIAGNOSIS — N390 Urinary tract infection, site not specified: Secondary | ICD-10-CM | POA: Diagnosis not present

## 2019-12-05 DIAGNOSIS — E1142 Type 2 diabetes mellitus with diabetic polyneuropathy: Secondary | ICD-10-CM | POA: Diagnosis not present

## 2019-12-07 DIAGNOSIS — I25118 Atherosclerotic heart disease of native coronary artery with other forms of angina pectoris: Secondary | ICD-10-CM | POA: Diagnosis not present

## 2019-12-07 DIAGNOSIS — E1142 Type 2 diabetes mellitus with diabetic polyneuropathy: Secondary | ICD-10-CM | POA: Diagnosis not present

## 2019-12-07 DIAGNOSIS — C9110 Chronic lymphocytic leukemia of B-cell type not having achieved remission: Secondary | ICD-10-CM | POA: Diagnosis not present

## 2019-12-07 DIAGNOSIS — I10 Essential (primary) hypertension: Secondary | ICD-10-CM | POA: Diagnosis not present

## 2019-12-07 DIAGNOSIS — C8590 Non-Hodgkin lymphoma, unspecified, unspecified site: Secondary | ICD-10-CM | POA: Diagnosis not present

## 2019-12-07 DIAGNOSIS — M4712 Other spondylosis with myelopathy, cervical region: Secondary | ICD-10-CM | POA: Diagnosis not present

## 2019-12-07 DIAGNOSIS — N390 Urinary tract infection, site not specified: Secondary | ICD-10-CM | POA: Diagnosis not present

## 2019-12-07 DIAGNOSIS — M5417 Radiculopathy, lumbosacral region: Secondary | ICD-10-CM | POA: Diagnosis not present

## 2019-12-07 DIAGNOSIS — A4151 Sepsis due to Escherichia coli [E. coli]: Secondary | ICD-10-CM | POA: Diagnosis not present

## 2019-12-10 ENCOUNTER — Other Ambulatory Visit: Payer: Self-pay | Admitting: Interventional Radiology

## 2019-12-10 DIAGNOSIS — M4712 Other spondylosis with myelopathy, cervical region: Secondary | ICD-10-CM | POA: Diagnosis not present

## 2019-12-10 DIAGNOSIS — M5417 Radiculopathy, lumbosacral region: Secondary | ICD-10-CM | POA: Diagnosis not present

## 2019-12-10 DIAGNOSIS — N281 Cyst of kidney, acquired: Secondary | ICD-10-CM

## 2019-12-10 DIAGNOSIS — A4151 Sepsis due to Escherichia coli [E. coli]: Secondary | ICD-10-CM | POA: Diagnosis not present

## 2019-12-10 DIAGNOSIS — N390 Urinary tract infection, site not specified: Secondary | ICD-10-CM | POA: Diagnosis not present

## 2019-12-11 DIAGNOSIS — N281 Cyst of kidney, acquired: Secondary | ICD-10-CM | POA: Diagnosis not present

## 2019-12-12 DIAGNOSIS — C9110 Chronic lymphocytic leukemia of B-cell type not having achieved remission: Secondary | ICD-10-CM | POA: Diagnosis not present

## 2019-12-12 DIAGNOSIS — A4151 Sepsis due to Escherichia coli [E. coli]: Secondary | ICD-10-CM | POA: Diagnosis not present

## 2019-12-12 DIAGNOSIS — I25118 Atherosclerotic heart disease of native coronary artery with other forms of angina pectoris: Secondary | ICD-10-CM | POA: Diagnosis not present

## 2019-12-12 DIAGNOSIS — C8590 Non-Hodgkin lymphoma, unspecified, unspecified site: Secondary | ICD-10-CM | POA: Diagnosis not present

## 2019-12-12 DIAGNOSIS — E1142 Type 2 diabetes mellitus with diabetic polyneuropathy: Secondary | ICD-10-CM | POA: Diagnosis not present

## 2019-12-12 DIAGNOSIS — I10 Essential (primary) hypertension: Secondary | ICD-10-CM | POA: Diagnosis not present

## 2019-12-12 DIAGNOSIS — M4712 Other spondylosis with myelopathy, cervical region: Secondary | ICD-10-CM | POA: Diagnosis not present

## 2019-12-12 DIAGNOSIS — M5417 Radiculopathy, lumbosacral region: Secondary | ICD-10-CM | POA: Diagnosis not present

## 2019-12-12 DIAGNOSIS — N390 Urinary tract infection, site not specified: Secondary | ICD-10-CM | POA: Diagnosis not present

## 2019-12-13 DIAGNOSIS — I25118 Atherosclerotic heart disease of native coronary artery with other forms of angina pectoris: Secondary | ICD-10-CM | POA: Diagnosis not present

## 2019-12-13 DIAGNOSIS — N390 Urinary tract infection, site not specified: Secondary | ICD-10-CM | POA: Diagnosis not present

## 2019-12-13 DIAGNOSIS — M4712 Other spondylosis with myelopathy, cervical region: Secondary | ICD-10-CM | POA: Diagnosis not present

## 2019-12-13 DIAGNOSIS — M5417 Radiculopathy, lumbosacral region: Secondary | ICD-10-CM | POA: Diagnosis not present

## 2019-12-13 DIAGNOSIS — E1142 Type 2 diabetes mellitus with diabetic polyneuropathy: Secondary | ICD-10-CM | POA: Diagnosis not present

## 2019-12-13 DIAGNOSIS — C9110 Chronic lymphocytic leukemia of B-cell type not having achieved remission: Secondary | ICD-10-CM | POA: Diagnosis not present

## 2019-12-13 DIAGNOSIS — C8590 Non-Hodgkin lymphoma, unspecified, unspecified site: Secondary | ICD-10-CM | POA: Diagnosis not present

## 2019-12-13 DIAGNOSIS — I10 Essential (primary) hypertension: Secondary | ICD-10-CM | POA: Diagnosis not present

## 2019-12-13 DIAGNOSIS — A4151 Sepsis due to Escherichia coli [E. coli]: Secondary | ICD-10-CM | POA: Diagnosis not present

## 2019-12-14 ENCOUNTER — Other Ambulatory Visit: Payer: Self-pay | Admitting: Hematology & Oncology

## 2019-12-17 ENCOUNTER — Telehealth: Payer: Self-pay | Admitting: *Deleted

## 2019-12-17 DIAGNOSIS — M4712 Other spondylosis with myelopathy, cervical region: Secondary | ICD-10-CM | POA: Diagnosis not present

## 2019-12-17 DIAGNOSIS — I25118 Atherosclerotic heart disease of native coronary artery with other forms of angina pectoris: Secondary | ICD-10-CM | POA: Diagnosis not present

## 2019-12-17 DIAGNOSIS — N390 Urinary tract infection, site not specified: Secondary | ICD-10-CM | POA: Diagnosis not present

## 2019-12-17 DIAGNOSIS — C8590 Non-Hodgkin lymphoma, unspecified, unspecified site: Secondary | ICD-10-CM | POA: Diagnosis not present

## 2019-12-17 DIAGNOSIS — A4151 Sepsis due to Escherichia coli [E. coli]: Secondary | ICD-10-CM | POA: Diagnosis not present

## 2019-12-17 DIAGNOSIS — I10 Essential (primary) hypertension: Secondary | ICD-10-CM | POA: Diagnosis not present

## 2019-12-17 DIAGNOSIS — M5417 Radiculopathy, lumbosacral region: Secondary | ICD-10-CM | POA: Diagnosis not present

## 2019-12-17 DIAGNOSIS — E1142 Type 2 diabetes mellitus with diabetic polyneuropathy: Secondary | ICD-10-CM | POA: Diagnosis not present

## 2019-12-17 DIAGNOSIS — C9110 Chronic lymphocytic leukemia of B-cell type not having achieved remission: Secondary | ICD-10-CM | POA: Diagnosis not present

## 2019-12-17 NOTE — Telephone Encounter (Signed)
Message received from patient's wife, [name] requesting a prescription for Testosterone gel for patient.  Call placed back to patient's wife and message left to notify her per order of Dr. Marin Olp to contact pt.'s PCP regarding Testosterone gel.  Instructed pt.'s wife to call office back with any questions or concerns.

## 2019-12-19 DIAGNOSIS — N529 Male erectile dysfunction, unspecified: Secondary | ICD-10-CM | POA: Diagnosis not present

## 2019-12-20 ENCOUNTER — Ambulatory Visit
Admission: RE | Admit: 2019-12-20 | Discharge: 2019-12-20 | Disposition: A | Discharge status: Home / Self Care | Payer: Medicare HMO | Source: Ambulatory Visit | Attending: Interventional Radiology | Admitting: Interventional Radiology

## 2019-12-20 ENCOUNTER — Other Ambulatory Visit: Payer: Self-pay

## 2019-12-20 ENCOUNTER — Encounter: Payer: Self-pay | Admitting: *Deleted

## 2019-12-20 DIAGNOSIS — I10 Essential (primary) hypertension: Secondary | ICD-10-CM | POA: Diagnosis not present

## 2019-12-20 DIAGNOSIS — N281 Cyst of kidney, acquired: Secondary | ICD-10-CM | POA: Diagnosis not present

## 2019-12-20 DIAGNOSIS — C9110 Chronic lymphocytic leukemia of B-cell type not having achieved remission: Secondary | ICD-10-CM | POA: Diagnosis not present

## 2019-12-20 DIAGNOSIS — A4151 Sepsis due to Escherichia coli [E. coli]: Secondary | ICD-10-CM | POA: Diagnosis not present

## 2019-12-20 DIAGNOSIS — C8590 Non-Hodgkin lymphoma, unspecified, unspecified site: Secondary | ICD-10-CM | POA: Diagnosis not present

## 2019-12-20 DIAGNOSIS — Z9889 Other specified postprocedural states: Secondary | ICD-10-CM | POA: Diagnosis not present

## 2019-12-20 DIAGNOSIS — N390 Urinary tract infection, site not specified: Secondary | ICD-10-CM | POA: Diagnosis not present

## 2019-12-20 DIAGNOSIS — E1142 Type 2 diabetes mellitus with diabetic polyneuropathy: Secondary | ICD-10-CM | POA: Diagnosis not present

## 2019-12-20 DIAGNOSIS — I25118 Atherosclerotic heart disease of native coronary artery with other forms of angina pectoris: Secondary | ICD-10-CM | POA: Diagnosis not present

## 2019-12-20 DIAGNOSIS — M5417 Radiculopathy, lumbosacral region: Secondary | ICD-10-CM | POA: Diagnosis not present

## 2019-12-20 DIAGNOSIS — M4712 Other spondylosis with myelopathy, cervical region: Secondary | ICD-10-CM | POA: Diagnosis not present

## 2019-12-20 HISTORY — PX: IR RADIOLOGIST EVAL & MGMT: IMG5224

## 2019-12-20 NOTE — Progress Notes (Signed)
Chief Complaint: Patient was consulted remotely today (TeleHealth) for follow up after left renal cyst aspiration x 4.  History of Present Illness: Ronald Rice is a 74 y.o. male with a history of prior recurrent Pseudomonas sepsis of unknown source and more recent hospitalization in early April for sepsis secondary to an E. coli urinary tract infection.  At that time he was hospitalized at [facility] from 08/30/2019 until 09/03/2019.  He has known longstanding multiple large cysts of the left kidney and associated decreased function of the left kidney.  Overall renal function is normal.   He underwent ultrasound-guided left renal cyst aspiration of 4 separate large cysts (2 upper and 2 lower) yielding 860 mL, 225 mL, 1,500 mL and 550 mL of clear yellow fluid. All four cyst fluid samples were sent for cytologic and culture analysis.  Contrast injection into each cyst cavity under fluoroscopy after decompression demonstrated no communication with the renal collecting system.  He did well following the procedure and has not had any recurrent infection or left-sided flank/abdominal pain.  He has followed up with Dr. Harlow Asa since the procedure.    Past Medical History:  Diagnosis Date  . Cancer ([place])    Leukemia   . Cervical myelopathy ([place])   . Chronic sinusitis   . CLL (chronic lymphocytic leukemia) ([place])    see records in care everywhere from [place]  . Coronary artery disease    minimal nonobstructive by 10/31/16 cath at [facility] in [place]  . Diabetes mellitus without complication ([place])   . Diffuse large B-cell lymphoma of lymph nodes of multiple regions ([place]) 10/31/2018  . Diverticulosis   . Erectile dysfunction   . Fracture    left ankle fracture with retained hardware  . GERD (gastroesophageal reflux disease)   . Goals of care, counseling/discussion 10/31/2018  . Gout   . Hyperlipemia   . Hypertension   . Lumbar radiculopathy   . Memory loss   . OSA (obstructive sleep  apnea)   . Peripheral neuropathy   . Renal cyst   . Sleep apnea    does not wear CPAP  . Thyroid nodule   . Wears dentures   . Wears glasses     Past Surgical History:  Procedure Laterality Date  . ANKLE RECONSTRUCTION Left 12/19/2018   Procedure: LEFT ANKLE REVISION FIXATION, REMOVAL OF HARDWARE, OPEN TREATMENT, SYNDESMOSIS, POSSIBLE ANKLE ARTHROTOMY, DELTOID LIGAMENT REPAIR;  Surgeon: Erle Crocker, MD;  Location: [place];  Service: Orthopedics;  Laterality: Left;  . ANTERIOR CERVICAL DECOMP/DISCECTOMY FUSION N/A 04/20/2017   Procedure: ANTERIOR CERVICAL DECOMPRESSION FUSION, CERVICAL 3-4, CERVICAL 4-5 WITH INSTRUMENTATION AND ALLOGRAFT; REQUEST 3.5 HOURS;  Surgeon: Phylliss Bob, MD;  Location: [place];  Service: Orthopedics;  Laterality: N/A;  ANTERIOR CERVICAL DECOMPRESSION FUSION, CERVICAL 3-4, CERVICAL 4-5 WITH INSTRUMENTATION AND ALLOGRAFT; REQUEST 3.5 HOURS  . BACK SURGERY    . CARDIAC CATHETERIZATION     in Care Everywhere 11/01/16  . COLONOSCOPY    . IR IMAGING GUIDED PORT INSERTION  11/10/2018  . IR RADIOLOGIST EVAL & MGMT  10/16/2019  . IR RENAL CYST  11/28/2019  . IR RENAL CYST  11/28/2019  . IR RENAL CYST  11/28/2019  . IR RENAL CYST  11/28/2019  . MULTIPLE TOOTH EXTRACTIONS    . ORIF ANKLE FRACTURE Left 09/13/2018   Procedure: OPEN REDUCTION INTERNAL FIXATION (ORIF) LATERAL LEFT ANKLE FRACTURE;  Surgeon: Dorna Leitz, MD;  Location: [place];  Service: Orthopedics;  Laterality: Left;  . SYNDESMOSIS  REPAIR Left 09/13/2018   Procedure: OPEN REDUCTION INTERNAL FIXATION SYNDESMOSIS REPAIR LEFT ANKLE;  Surgeon: Dorna Leitz, MD;  Location: [place];  Service: Orthopedics;  Laterality: Left;    Allergies: Patient has no known allergies.  Medications: Prior to Admission medications   Medication Sig Start Date End Date Taking? Authorizing Provider  acetaminophen (TYLENOL) 500 MG tablet Take 1,000 mg by mouth every 6 (six) hours as needed (pain).     [provider]    allopurinol (ZYLOPRIM) 300 MG tablet Take 300 mg by mouth daily.    [provider]  aspirin EC 81 MG tablet Take 81 mg by mouth daily.    [provider]  atenolol (TENORMIN) 25 MG tablet Take 25 mg by mouth daily.     [provider]  atorvastatin (LIPITOR) 40 MG tablet Take 40 mg by mouth daily.    [provider]  Cholecalciferol (VITAMIN D3 PO) Take 1 tablet by mouth daily.    [provider]  CINNAMON PO Take 1 tablet by mouth daily.    [provider]  diclofenac sodium (VOLTAREN) 1 % GEL Apply 1 application topically as needed (pain).  10/10/18   [provider]  donepezil (ARICEPT ODT) 5 MG disintegrating tablet Take 5 mg by mouth at bedtime. 03/29/19   [provider]  DULoxetine (CYMBALTA) 60 MG capsule TAKE 1 CAPSULE(60 MG) BY MOUTH DAILY 12/14/19   Volanda Napoleon, MD  famotidine (PEPCID) 20 MG tablet Take 20 mg by mouth at bedtime as needed for heartburn.     [provider]  ipratropium (ATROVENT) 0.06 % nasal spray Place 1 spray into both nostrils daily as needed for rhinitis.  08/29/19   [provider]  Lancets (ONETOUCH DELICA PLUS [id number]) [place]  09/27/19   [provider]  lidocaine-prilocaine (EMLA) cream Apply to PAC 1 hour prior to procedure. 01/03/19   Volanda Napoleon, MD  lisinopril (ZESTRIL) 40 MG tablet Take 1 tablet (40 mg total) by mouth daily for 30 days. 10/29/18 12/20/27  Donne [place], MD  metFORMIN (GLUCOPHAGE-XR) 500 MG 24 hr tablet Take 500 mg by mouth every evening.  11/28/18   [provider]  NON FORMULARY Take 2 tablets by mouth daily. Blood Sugar Defense     [provider]  potassium chloride (K-DUR) 10 MEQ tablet Take 10 mEq by mouth daily.     [provider]  pyridoxine (B-6) 250 MG tablet Take 250 mg by mouth daily.    [provider]  tamsulosin (FLOMAX) 0.4 MG CAPS capsule Take 0.4 mg by mouth daily.    [provider]  traMADol (ULTRAM) 50 MG tablet TAKE 1 TABLET BY MOUTH EVERY 6 HOURS AS NEEDED Patient taking differently: Take 50 mg by mouth every 6 (six) hours as needed for moderate pain.  10/25/19   Volanda Napoleon, MD     Family History  Problem Relation Age of Onset  . Heart attack Mother   . Stroke Father   . Diabetes Sister   . Diabetes Brother     Social History   Socioeconomic History  . Marital status: Divorced    Spouse name: Not on file  . Number of children: 2  . Years of education: 64  . Highest education level: Not on file  Occupational History  . Occupation: Retired  Tobacco Use  . Smoking status: Former Smoker    Types: Pipe, Cigars, Cigarettes    Quit date: 10/31/1989  Years since quitting: 30.1  . Smokeless tobacco: Never Used  Vaping Use  . Vaping Use: Never used  Substance and Sexual Activity  . Alcohol use: Yes    Comment: occasional  . Drug use: No  . Sexual activity: Not on file  Other Topics Concern  . Not on file  Social History Narrative   Lives with daughter.   Caffeine use: Drinks coffee daily (1-2 cups)   Right handed    Social Determinants of Health   Financial Resource Strain:   . Difficulty of Paying Living Expenses:   Food Insecurity:   . Worried About Charity fundraiser in the Last Year:   . Arboriculturist in the Last Year:   Transportation Needs:   . Film/video editor (Medical):   Marland Kitchen Lack of Transportation (Non-Medical):   Physical Activity:   . Days of Exercise per Week:   . Minutes of Exercise per Session:   Stress:   . Feeling of Stress :   Social Connections:   . Frequency of Communication with Friends and Family:   . Frequency of Social Gatherings with Friends and Family:   . Attends Religious Services:   . Active Member of Clubs or Organizations:   . Attends Archivist Meetings:   Marland Kitchen Marital Status:     Review of Systems  Constitutional: Negative.   Respiratory: Negative.   Cardiovascular:  Negative.   Gastrointestinal: Negative.   Genitourinary: Negative.   Musculoskeletal: Negative.   Neurological: Negative.     Review of Systems: A 12 point ROS discussed and pertinent positives are indicated in the HPI above.  All other systems are negative.  Physical Exam No direct physical exam was performed (except for noted visual exam findings with Video Visits).   Vital Signs: There were no vitals taken for this visit.  Imaging: IR Renal Cyst  Result Date: 11/28/2019 INDICATION: History of large left-sided renal cysts and status post prior ultrasound-guided aspiration drainage of the largest two cysts on 05/23/2018. The patient has a history recurrent Pseudomonas sepsis of unknown source and urinary sepsis from E coli infection. He presents for renal cyst aspiration and contrast injection under fluoroscopy to determine whether they communicate with the renal collecting system. EXAM: RENAL CYST ASPIRATION X 4 MEDICATIONS: None ANESTHESIA/SEDATION: Fentanyl 175 mcg IV; Versed 4.0 mg IV Moderate Sedation Time:  50 minutes The patient was continuously monitored during the procedure by the interventional radiology nurse under my direct supervision. CONTRAST:  45 mL Omnipaque 300 FLUOROSCOPY TIME:  2 minutes and 18 seconds.  28.0 mGy. COMPLICATIONS: None immediate. PROCEDURE: Informed written consent was obtained from the patient after a thorough discussion of the procedural risks, benefits and alternatives. All questions were addressed. Maximal Sterile Barrier Technique was utilized including caps, mask, sterile gowns, sterile gloves, sterile drape, hand hygiene and skin antiseptic. A timeout was performed prior to the initiation of the procedure. Imaging of the left kidney and left renal cysts was performed initially by ultrasound in a prone position. Initially a left upper pole renal cyst was targeted and under ultrasound guidance, a 5 [place] Yueh centesis catheter inserted into the cyst cavity.  Aspiration was performed utilizing a vacuum bottle and fluid sample sent for culture analysis and fluid cytology labeled left upper pole cyst #1. Cyst cavity contrast injection was then performed under fluoroscopy via the Yueh catheter with injection of contrast material. Fluoroscopic spot images were saved. The cyst cavity was then decompressed of injected contrast  material. Identical procedure was then performed initially with ultrasound-guided catheter decompression left upper pole cyst #2 followed by cyst cavity contrast injection under fluoroscopy. Fluid sample was sent for culture analysis and cytology. Decompression left lower pole cyst #3 was then performed via a centesis catheter followed by cyst cavity contrast injection under fluoroscopy. Fluid sample was sent for culture analysis and cytology. Decompression left lower pole cyst #4 was then performed via a centesis catheter followed by cyst cavity contrast injection under fluoroscopy. Fluid sample was sent for culture analysis and cytology. FINDINGS: Large left renal cysts are again identified by ultrasound. 2 separate upper pole cysts and 2 separate lower pole cysts were targeted for decompression and were the largest cysts present. Left upper pole cyst 1 yielded 860 mL of clear yellow fluid. This resulted in complete decompression of the cyst cavity by ultrasound. Contrast injection demonstrates irregular cyst cavity without opacification of the renal collecting system. Left upper pole cyst 2 yielded 225 mL of clear yellow fluid. This resulted in complete decompression of the cyst cavity by ultrasound. Contrast injection demonstrates irregular cyst cavity without opacification of the renal collecting system. Left upper pole cyst 3 yielded 1,500 mL of clear yellow fluid. This resulted in complete decompression of the cyst cavity by ultrasound. Contrast injection demonstrates irregular cyst cavity without opacification of the renal collecting system. Left  upper pole cyst 4 yielded 550 mL of clear yellow fluid. This resulted in complete decompression of the cyst cavity by ultrasound. Contrast injection demonstrates irregular cyst cavity without opacification of the renal collecting system. IMPRESSION: Ultrasound-guided left renal cyst aspiration of 4 separate large cysts as detailed above. Fluid samples from each cyst were sent for both culture analysis and fluid cytology. These were labeled according to the numbering system above. Contrast injection of each cyst cavity did not demonstrate any communication of the cystic cavities with the left renal collecting system. Electronically Signed   By: Aletta Edouard M.D.   On: 11/28/2019 15:41   IR Renal Cyst  Result Date: 11/28/2019 INDICATION: History of large left-sided renal cysts and status post prior ultrasound-guided aspiration drainage of the largest two cysts on 05/23/2018. The patient has a history recurrent Pseudomonas sepsis of unknown source and urinary sepsis from E coli infection. He presents for renal cyst aspiration and contrast injection under fluoroscopy to determine whether they communicate with the renal collecting system. EXAM: RENAL CYST ASPIRATION X 4 MEDICATIONS: None ANESTHESIA/SEDATION: Fentanyl 175 mcg IV; Versed 4.0 mg IV Moderate Sedation Time:  50 minutes The patient was continuously monitored during the procedure by the interventional radiology nurse under my direct supervision. CONTRAST:  45 mL Omnipaque 300 FLUOROSCOPY TIME:  2 minutes and 18 seconds.  28.0 mGy. COMPLICATIONS: None immediate. PROCEDURE: Informed written consent was obtained from the patient after a thorough discussion of the procedural risks, benefits and alternatives. All questions were addressed. Maximal Sterile Barrier Technique was utilized including caps, mask, sterile gowns, sterile gloves, sterile drape, hand hygiene and skin antiseptic. A timeout was performed prior to the initiation of the procedure. Imaging of  the left kidney and left renal cysts was performed initially by ultrasound in a prone position. Initially a left upper pole renal cyst was targeted and under ultrasound guidance, a 5 [place] Yueh centesis catheter inserted into the cyst cavity. Aspiration was performed utilizing a vacuum bottle and fluid sample sent for culture analysis and fluid cytology labeled left upper pole cyst #1. Cyst cavity contrast injection was then performed under fluoroscopy  via the Kermit catheter with injection of contrast material. Fluoroscopic spot images were saved. The cyst cavity was then decompressed of injected contrast material. Identical procedure was then performed initially with ultrasound-guided catheter decompression left upper pole cyst #2 followed by cyst cavity contrast injection under fluoroscopy. Fluid sample was sent for culture analysis and cytology. Decompression left lower pole cyst #3 was then performed via a centesis catheter followed by cyst cavity contrast injection under fluoroscopy. Fluid sample was sent for culture analysis and cytology. Decompression left lower pole cyst #4 was then performed via a centesis catheter followed by cyst cavity contrast injection under fluoroscopy. Fluid sample was sent for culture analysis and cytology. FINDINGS: Large left renal cysts are again identified by ultrasound. 2 separate upper pole cysts and 2 separate lower pole cysts were targeted for decompression and were the largest cysts present. Left upper pole cyst 1 yielded 860 mL of clear yellow fluid. This resulted in complete decompression of the cyst cavity by ultrasound. Contrast injection demonstrates irregular cyst cavity without opacification of the renal collecting system. Left upper pole cyst 2 yielded 225 mL of clear yellow fluid. This resulted in complete decompression of the cyst cavity by ultrasound. Contrast injection demonstrates irregular cyst cavity without opacification of the renal collecting system. Left  upper pole cyst 3 yielded 1,500 mL of clear yellow fluid. This resulted in complete decompression of the cyst cavity by ultrasound. Contrast injection demonstrates irregular cyst cavity without opacification of the renal collecting system. Left upper pole cyst 4 yielded 550 mL of clear yellow fluid. This resulted in complete decompression of the cyst cavity by ultrasound. Contrast injection demonstrates irregular cyst cavity without opacification of the renal collecting system. IMPRESSION: Ultrasound-guided left renal cyst aspiration of 4 separate large cysts as detailed above. Fluid samples from each cyst were sent for both culture analysis and fluid cytology. These were labeled according to the numbering system above. Contrast injection of each cyst cavity did not demonstrate any communication of the cystic cavities with the left renal collecting system. Electronically Signed   By: Aletta Edouard M.D.   On: 11/28/2019 15:41   IR Renal Cyst  Result Date: 11/28/2019 INDICATION: History of large left-sided renal cysts and status post prior ultrasound-guided aspiration drainage of the largest two cysts on 05/23/2018. The patient has a history recurrent Pseudomonas sepsis of unknown source and urinary sepsis from E coli infection. He presents for renal cyst aspiration and contrast injection under fluoroscopy to determine whether they communicate with the renal collecting system. EXAM: RENAL CYST ASPIRATION X 4 MEDICATIONS: None ANESTHESIA/SEDATION: Fentanyl 175 mcg IV; Versed 4.0 mg IV Moderate Sedation Time:  50 minutes The patient was continuously monitored during the procedure by the interventional radiology nurse under my direct supervision. CONTRAST:  45 mL Omnipaque 300 FLUOROSCOPY TIME:  2 minutes and 18 seconds.  28.0 mGy. COMPLICATIONS: None immediate. PROCEDURE: Informed written consent was obtained from the patient after a thorough discussion of the procedural risks, benefits and alternatives. All  questions were addressed. Maximal Sterile Barrier Technique was utilized including caps, mask, sterile gowns, sterile gloves, sterile drape, hand hygiene and skin antiseptic. A timeout was performed prior to the initiation of the procedure. Imaging of the left kidney and left renal cysts was performed initially by ultrasound in a prone position. Initially a left upper pole renal cyst was targeted and under ultrasound guidance, a 5 [place] Yueh centesis catheter inserted into the cyst cavity. Aspiration was performed utilizing a vacuum bottle and fluid  sample sent for culture analysis and fluid cytology labeled left upper pole cyst #1. Cyst cavity contrast injection was then performed under fluoroscopy via the Yueh catheter with injection of contrast material. Fluoroscopic spot images were saved. The cyst cavity was then decompressed of injected contrast material. Identical procedure was then performed initially with ultrasound-guided catheter decompression left upper pole cyst #2 followed by cyst cavity contrast injection under fluoroscopy. Fluid sample was sent for culture analysis and cytology. Decompression left lower pole cyst #3 was then performed via a centesis catheter followed by cyst cavity contrast injection under fluoroscopy. Fluid sample was sent for culture analysis and cytology. Decompression left lower pole cyst #4 was then performed via a centesis catheter followed by cyst cavity contrast injection under fluoroscopy. Fluid sample was sent for culture analysis and cytology. FINDINGS: Large left renal cysts are again identified by ultrasound. 2 separate upper pole cysts and 2 separate lower pole cysts were targeted for decompression and were the largest cysts present. Left upper pole cyst 1 yielded 860 mL of clear yellow fluid. This resulted in complete decompression of the cyst cavity by ultrasound. Contrast injection demonstrates irregular cyst cavity without opacification of the renal collecting  system. Left upper pole cyst 2 yielded 225 mL of clear yellow fluid. This resulted in complete decompression of the cyst cavity by ultrasound. Contrast injection demonstrates irregular cyst cavity without opacification of the renal collecting system. Left upper pole cyst 3 yielded 1,500 mL of clear yellow fluid. This resulted in complete decompression of the cyst cavity by ultrasound. Contrast injection demonstrates irregular cyst cavity without opacification of the renal collecting system. Left upper pole cyst 4 yielded 550 mL of clear yellow fluid. This resulted in complete decompression of the cyst cavity by ultrasound. Contrast injection demonstrates irregular cyst cavity without opacification of the renal collecting system. IMPRESSION: Ultrasound-guided left renal cyst aspiration of 4 separate large cysts as detailed above. Fluid samples from each cyst were sent for both culture analysis and fluid cytology. These were labeled according to the numbering system above. Contrast injection of each cyst cavity did not demonstrate any communication of the cystic cavities with the left renal collecting system. Electronically Signed   By: Aletta Edouard M.D.   On: 11/28/2019 15:41   IR Renal Cyst  Result Date: 11/28/2019 INDICATION: History of large left-sided renal cysts and status post prior ultrasound-guided aspiration drainage of the largest two cysts on 05/23/2018. The patient has a history recurrent Pseudomonas sepsis of unknown source and urinary sepsis from E coli infection. He presents for renal cyst aspiration and contrast injection under fluoroscopy to determine whether they communicate with the renal collecting system. EXAM: RENAL CYST ASPIRATION X 4 MEDICATIONS: None ANESTHESIA/SEDATION: Fentanyl 175 mcg IV; Versed 4.0 mg IV Moderate Sedation Time:  50 minutes The patient was continuously monitored during the procedure by the interventional radiology nurse under my direct supervision. CONTRAST:  45 mL  Omnipaque 300 FLUOROSCOPY TIME:  2 minutes and 18 seconds.  28.0 mGy. COMPLICATIONS: None immediate. PROCEDURE: Informed written consent was obtained from the patient after a thorough discussion of the procedural risks, benefits and alternatives. All questions were addressed. Maximal Sterile Barrier Technique was utilized including caps, mask, sterile gowns, sterile gloves, sterile drape, hand hygiene and skin antiseptic. A timeout was performed prior to the initiation of the procedure. Imaging of the left kidney and left renal cysts was performed initially by ultrasound in a prone position. Initially a left upper pole renal cyst was targeted and  under ultrasound guidance, a 5 [place] Yueh centesis catheter inserted into the cyst cavity. Aspiration was performed utilizing a vacuum bottle and fluid sample sent for culture analysis and fluid cytology labeled left upper pole cyst #1. Cyst cavity contrast injection was then performed under fluoroscopy via the Yueh catheter with injection of contrast material. Fluoroscopic spot images were saved. The cyst cavity was then decompressed of injected contrast material. Identical procedure was then performed initially with ultrasound-guided catheter decompression left upper pole cyst #2 followed by cyst cavity contrast injection under fluoroscopy. Fluid sample was sent for culture analysis and cytology. Decompression left lower pole cyst #3 was then performed via a centesis catheter followed by cyst cavity contrast injection under fluoroscopy. Fluid sample was sent for culture analysis and cytology. Decompression left lower pole cyst #4 was then performed via a centesis catheter followed by cyst cavity contrast injection under fluoroscopy. Fluid sample was sent for culture analysis and cytology. FINDINGS: Large left renal cysts are again identified by ultrasound. 2 separate upper pole cysts and 2 separate lower pole cysts were targeted for decompression and were the largest  cysts present. Left upper pole cyst 1 yielded 860 mL of clear yellow fluid. This resulted in complete decompression of the cyst cavity by ultrasound. Contrast injection demonstrates irregular cyst cavity without opacification of the renal collecting system. Left upper pole cyst 2 yielded 225 mL of clear yellow fluid. This resulted in complete decompression of the cyst cavity by ultrasound. Contrast injection demonstrates irregular cyst cavity without opacification of the renal collecting system. Left upper pole cyst 3 yielded 1,500 mL of clear yellow fluid. This resulted in complete decompression of the cyst cavity by ultrasound. Contrast injection demonstrates irregular cyst cavity without opacification of the renal collecting system. Left upper pole cyst 4 yielded 550 mL of clear yellow fluid. This resulted in complete decompression of the cyst cavity by ultrasound. Contrast injection demonstrates irregular cyst cavity without opacification of the renal collecting system. IMPRESSION: Ultrasound-guided left renal cyst aspiration of 4 separate large cysts as detailed above. Fluid samples from each cyst were sent for both culture analysis and fluid cytology. These were labeled according to the numbering system above. Contrast injection of each cyst cavity did not demonstrate any communication of the cystic cavities with the left renal collecting system. Electronically Signed   By: Aletta Edouard M.D.   On: 11/28/2019 15:41    Labs:  CBC: Recent Labs    07/23/19 1203 09/13/19 0820 10/18/19 0915 11/28/19 1144  WBC 3.1* 3.9* 3.5* 3.0*  HGB 14.1 13.8 13.0 12.3*  HCT 39.2 38.6* 36.4* 35.4*  PLT 169 249 161 139*    COAGS: Recent Labs    11/28/19 1144  INR 1.0    BMP: Recent Labs    06/12/19 0854 07/23/19 1203 09/13/19 0820 10/18/19 0915  NA 139 141 141 142  K 3.3* 3.8 4.0 3.8  CL 103 105 104 107  CO2 27 29 28 26   GLUCOSE 258* 255* 185* 198*  BUN 11 9 11 14   CALCIUM 9.7 9.5 9.3 9.2   CREATININE 1.16 1.05 1.16 0.98  GFRNONAA >60 >60 >60 >60  GFRAA >60 >60 >60 >60    LIVER FUNCTION TESTS: Recent Labs    06/12/19 0854 07/23/19 1203 09/13/19 0820 10/18/19 0915  BILITOT 0.5 0.5 0.5 0.4  AST 20 16 17 15   ALT 23 21 23 16   ALKPHOS 61 67 67 66  PROT 6.5 6.4* 6.4* 6.3*  ALBUMIN 4.3 4.3 4.1 4.1  Assessment and Plan:  I spoke with Mr. Stegall by phone to review cyst analysis results with him.  Upper pole cyst 1 did contain some white blood cells but no organisms and culture was negative.  Lower pole cyst 4 demonstrated some rare white blood cells but no organisms or culture growth.  Upper pole cyst 2 and lower pole cyst 3 demonstrated no WBC or culture growth.  Cytology of all 4 cyst fluid samples demonstrated no malignant or atypical cells.  Given cyst analysis, I did not recommend any further intervention at this time unless he were to develop any concerning imaging findings or develop significant pain related to cyst enlargement.   Electronically Signed: Azzie Roup 12/20/2019, 12:46 PM     I spent a total of 10 Minutes in remote  clinical consultation, greater than 50% of which was counseling/coordinating care for left renal cysts.    Visit type: Audio only (telephone). Audio (no video) only due to patient's lack of internet/smartphone capability. Alternative for in-person consultation at [facility], [address] [place], [place], [place]. This visit type was conducted due to national recommendations for restrictions regarding the COVID-19 Pandemic (e.g. social distancing).  This format is felt to be most appropriate for this patient at this time.  All issues noted in this document were discussed and addressed.

## 2019-12-25 ENCOUNTER — Encounter: Payer: Self-pay | Admitting: Gastroenterology

## 2019-12-26 DIAGNOSIS — I10 Essential (primary) hypertension: Secondary | ICD-10-CM | POA: Diagnosis not present

## 2019-12-26 DIAGNOSIS — C9110 Chronic lymphocytic leukemia of B-cell type not having achieved remission: Secondary | ICD-10-CM | POA: Diagnosis not present

## 2019-12-26 DIAGNOSIS — C8590 Non-Hodgkin lymphoma, unspecified, unspecified site: Secondary | ICD-10-CM | POA: Diagnosis not present

## 2019-12-26 DIAGNOSIS — N390 Urinary tract infection, site not specified: Secondary | ICD-10-CM | POA: Diagnosis not present

## 2019-12-26 DIAGNOSIS — A4151 Sepsis due to Escherichia coli [E. coli]: Secondary | ICD-10-CM | POA: Diagnosis not present

## 2019-12-26 DIAGNOSIS — E1142 Type 2 diabetes mellitus with diabetic polyneuropathy: Secondary | ICD-10-CM | POA: Diagnosis not present

## 2019-12-26 DIAGNOSIS — I25118 Atherosclerotic heart disease of native coronary artery with other forms of angina pectoris: Secondary | ICD-10-CM | POA: Diagnosis not present

## 2019-12-26 DIAGNOSIS — M4712 Other spondylosis with myelopathy, cervical region: Secondary | ICD-10-CM | POA: Diagnosis not present

## 2019-12-26 DIAGNOSIS — M5417 Radiculopathy, lumbosacral region: Secondary | ICD-10-CM | POA: Diagnosis not present

## 2019-12-27 ENCOUNTER — Inpatient Hospital Stay: Payer: Medicare HMO | Attending: Hematology & Oncology

## 2019-12-30 DIAGNOSIS — G4737 Central sleep apnea in conditions classified elsewhere: Secondary | ICD-10-CM | POA: Diagnosis not present

## 2019-12-30 DIAGNOSIS — G4733 Obstructive sleep apnea (adult) (pediatric): Secondary | ICD-10-CM | POA: Diagnosis not present

## 2019-12-31 ENCOUNTER — Ambulatory Visit: Payer: Medicare HMO | Admitting: Neurology

## 2019-12-31 ENCOUNTER — Ambulatory Visit (INDEPENDENT_AMBULATORY_CARE_PROVIDER_SITE_OTHER): Payer: Medicare HMO | Admitting: Neurology

## 2019-12-31 DIAGNOSIS — R269 Unspecified abnormalities of gait and mobility: Secondary | ICD-10-CM

## 2019-12-31 DIAGNOSIS — M5416 Radiculopathy, lumbar region: Secondary | ICD-10-CM | POA: Diagnosis not present

## 2019-12-31 DIAGNOSIS — R531 Weakness: Secondary | ICD-10-CM

## 2019-12-31 DIAGNOSIS — G63 Polyneuropathy in diseases classified elsewhere: Secondary | ICD-10-CM

## 2019-12-31 NOTE — Patient Instructions (Addendum)
[facility]right [place] Ad3.5   [phone])  Medical supply store [place], [place] Open ? Closes 6PM  [phone] In-store shopping [place] 4.8   [phone])  Medical supply store [place] [address] Open ? Closes 5:30PM  [phone]  "... [employer] for all your DME needs, etc." In-store shopping  [facility] 2.9   (9)  Medical supply store [place] # [phone] Open ? Closes 5PM  [phone] In-store shopping [place] 4.0   (2)  Medical supply store [place] now  [phone] In-store shopping  [place] No reviews  Home health care service [place] [phone]  [facility] Medical Supply [place] 3.5   [phone])  Medical supply store [address] Open ? Closes 6PM  [phone]  Their website mentions durable medical equipment In-store shopping Delivery  In the [place] No reviews  Medical supply store [address]  In [place] [phone] In-store shopping  [place] 2.6   (7)  Medical supply store [address] Open ? Closes 6PM  [phone] In-store shopping  [employer] 2.5   (28)  Oxygen equipment supplier [address] Open ? Closes 5PM  [phone]  [place] No reviews  Home health care service [place] [phone]  DME [place] No reviews  Plumbing supply store Open ? Closes 5PM  [phone]  [place] 2.0   (21)  Home health care service [place]  In [place] ? Closes 5PM  [phone]  "... is the most incompetent DME provider I have ever had to deal with."  [employer] an [place] 3.8   (96)  Medical supply store [place], [place] Open ? Closes 5PM  [phone]  [facility] [employer] 4.0   (2)  Medical supply store [address] Jerilynn Mages Open ? Closes 5PM  [phone] In-store shopping  [facility] No reviews  Medical supply store [place]  In [facility] Open now  [phone]  Medical [employer] 5.0   (3)  Medical supply store [place] [phone]  [employer] 4.8   [phone])  Medical supply store [place] ? Closes 5PM  [phone]  Their website mentions dme  [employer] 1.9   (17)  Wheelchair store [place] [address] Open ? Closes 5PM  [phone]  [facility] 3.7   (3)  Medical supply store [place] [address] Open ? Closes 5PM  [phone] In-store shopping  [employer] 2.5   (6)  Medical supply store [place], [place]  In [place] ? Closes 5AM Tue  [phone] Delivery  [place] 3.8   (4)  Home health care service [place], [place] Open now

## 2019-12-31 NOTE — Progress Notes (Signed)
PATIENT: Ronald Rice DOB: 01-20-1946  No chief complaint on file.    HISTORICAL  Ronald Rice is a 74 year old male, seen in request by his primary care physician Dr. Deland Pretty for evaluation of worsening paresthesia of bilateral hands and feet, gait abnormality, he is accompanied by his wife at today's clinical visit,  I have seen him previously in February 2019.  he has past medical history of hypertension, hyperlipidemia, gout, diabetes, coronary artery disease, obesity  He had multiple lumbar decompression surgery, this was all done in [place], most recent lumbar decompression was in 2016, also had anterior cervical decompression fusion C4-5 with instrumentation and allograft on April 20, 2017, history of chronic lymphocytic leukemia, was treated at [place] with chemotherapy,  He reported gradual onset gait abnormality since 2014, gradually getting worse, despite the surgery, worsening low back pain, sometimes radiating pain to right leg, increased weakness in his right leg, also has occasionally bowel incontinence, urinary incontinence.  MRI of cervical spine March 2019, evidence of C3-7 fusion, metal artifact, residual mild stenosis at C3-4, C4-5, but no evidence of cord compression, there appears to be spinal cord atrophy at C3-4, C4-5, sagittal STIR imaging appears to show small foci of increased signal within the spinal cord adjacent to C4-5,  MRI of thoracic spine showed multilevel degenerative disc disease, facet arthropathy of the thoracic spine, central disc protrusion at T3-4, T4-5 contact the ventral cord, no spinal cord and neural foraminal stenosis at this level, questionable increased central cord signal at T6-7, T7-8,  MRI of the lumbar spine in February 2019, status post L3 through S1 posterior decompression and fusion, moderate L1 compression fracture, status post cement augmentation, stable degenerative changes of lumbar spine, moderate canal  stenosis at L2 and 3, most compatible with adjacent segment disease, multilevel neuroforaminal narrowing, moderate on the left at L1-2,  Patient complains of radiating pain from right lower back to right lower extremity, after bearing weight, sometimes with body changing position turning towards the right side, he has persistent bilateral distal ankle weakness,  EMG nerve conduction study in March 2019 showed evidence of chronic bilateral lumbosacral radiculopathy, right more obvious than the left side, mainly involving bilateral L3, L4 myotomes.  There is also evidence of severe right carpal tunnel syndrome, in the background of mild axonal sensorimotor polyneuropathy.  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  Since his visit in 2019, he was under the care of oncologist Dr. 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, R-CHOP started on November 03, 2018, status post cycle #8, currently receiving radiation therapy to left neck,  He complains lower extremity feeling cold all the time, numbness, significant lower extremity weakness, buckling underneath him, rely on his upper extremity distress to hold on to walker, worsening bilateral hands numbness, he also has bowel and bladder urgency, occasionally incontinence,   Update January 02, 2020 Continue to have significant bilateral lower extremity and hands paresthesia, gait abnormality,  Return for electrodiagnostic study today, which confirmed evidence of chronic bilateral lumbosacral  radiculopathy, right worse than left, in addition, severe length dependent axonal sensorimotor polyneuropathy, evidence of progression compared to previous study in March 2019, likely  due to chemotherapy treatment for non-Hodgkin's lymphoma in 2019  REVIEW OF SYSTEMS: Full 14 system review of systems performed and notable only for as above All other review of systems were negative.  ALLERGIES: No Known Allergies  HOME MEDICATIONS: Current Outpatient Medications  Medication Sig Dispense Refill  . acetaminophen (TYLENOL) 500 MG tablet Take 1,000 mg by mouth every 6 (six) hours as needed (pain).     Marland Kitchen allopurinol (ZYLOPRIM) 300 MG tablet Take 300 mg by mouth daily.    Marland Kitchen aspirin EC 81 MG tablet Take 81 mg by mouth daily.    Marland Kitchen atenolol (TENORMIN) 25 MG tablet Take 25 mg by mouth daily.     Marland Kitchen atorvastatin (LIPITOR) 40 MG tablet Take 40 mg by mouth daily.    . Cholecalciferol (VITAMIN D3 PO) Take 1 tablet by mouth daily.    Marland Kitchen CINNAMON PO Take 1 tablet by mouth daily.    . diclofenac sodium (VOLTAREN) 1 % GEL Apply 1 application topically as needed (pain).     Marland Kitchen donepezil (ARICEPT ODT) 5 MG disintegrating tablet Take 5 mg by mouth at bedtime.    . DULoxetine (CYMBALTA) 60 MG capsule TAKE 1 CAPSULE(60 MG) BY MOUTH DAILY 90 capsule 3  . famotidine (PEPCID) 20 MG tablet Take 20 mg by mouth at bedtime as needed for heartburn.     Marland Kitchen ipratropium (ATROVENT) 0.06 % nasal spray Place 1 spray into both nostrils daily as needed for rhinitis.     . Lancets (ONETOUCH DELICA PLUS [id number]) MISC     . lidocaine-prilocaine (EMLA) cream Apply to PAC 1 hour prior to procedure. 30 g 3  . lisinopril (ZESTRIL) 40 MG tablet Take 1 tablet (40 mg total) by mouth daily for 30 days. 30 tablet 0  . metFORMIN (GLUCOPHAGE-XR) 500 MG 24 hr tablet Take 500 mg by mouth every evening.     . NON FORMULARY Take 2 tablets by mouth daily. Blood Sugar Defense     . potassium chloride (K-DUR) 10 MEQ tablet Take 10 mEq by mouth daily.      Marland Kitchen pyridoxine (B-6) 250 MG tablet Take 250 mg by mouth daily.    . tamsulosin (FLOMAX) 0.4 MG CAPS capsule Take 0.4 mg by mouth daily.    . traMADol (ULTRAM) 50 MG tablet TAKE 1 TABLET BY MOUTH EVERY 6 HOURS AS NEEDED (Patient taking differently: Take 50 mg by mouth every 6 (six) hours as needed for moderate pain. ) 90 tablet 0   No current facility-administered medications for this visit.    PAST MEDICAL HISTORY: Past Medical History:  Diagnosis Date  . Cancer ([place])    Leukemia   . Cervical myelopathy ([place])   . Chronic sinusitis   . CLL (chronic lymphocytic leukemia) ([place])    see records in care everywhere from [place]  . Coronary artery disease    minimal nonobstructive by 10/31/16 cath at [facility] in [place]  . Diabetes mellitus without complication ([place])   . Diffuse large B-cell lymphoma of lymph nodes of multiple regions ([place]) 10/31/2018  . Diverticulosis   . Erectile dysfunction   . Fracture    left ankle fracture with retained hardware  . GERD (gastroesophageal reflux disease)   . Goals of care, counseling/discussion 10/31/2018  . Gout   . Hyperlipemia   . Hypertension   . Lumbar radiculopathy   . Memory loss   . OSA (obstructive sleep apnea)   . Peripheral neuropathy   . Renal cyst   . Sleep apnea  does not wear CPAP  . Thyroid nodule   . Wears dentures   . Wears glasses     PAST SURGICAL HISTORY: Past Surgical History:  Procedure Laterality Date  . ANKLE RECONSTRUCTION Left 12/19/2018   Procedure: LEFT ANKLE REVISION FIXATION, REMOVAL OF HARDWARE, OPEN TREATMENT, SYNDESMOSIS, POSSIBLE ANKLE ARTHROTOMY, DELTOID LIGAMENT REPAIR;  Surgeon: Erle Crocker, MD;  Location: [facility];  Service: Orthopedics;  Laterality: Left;  . ANTERIOR CERVICAL DECOMP/DISCECTOMY FUSION N/A 04/20/2017   Procedure: ANTERIOR CERVICAL DECOMPRESSION FUSION, CERVICAL 3-4, CERVICAL 4-5 WITH INSTRUMENTATION AND ALLOGRAFT; REQUEST 3.5 HOURS;  Surgeon: Phylliss Bob, MD;  Location: [place] [facility];   Service: Orthopedics;  Laterality: N/A;  ANTERIOR CERVICAL DECOMPRESSION FUSION, CERVICAL 3-4, CERVICAL 4-5 WITH INSTRUMENTATION AND ALLOGRAFT; REQUEST 3.5 HOURS  . BACK SURGERY    . CARDIAC CATHETERIZATION     in Care Everywhere 11/01/16  . COLONOSCOPY    . IR IMAGING GUIDED PORT INSERTION  11/10/2018  . IR RADIOLOGIST EVAL & MGMT  10/16/2019  . IR RADIOLOGIST EVAL & MGMT  12/20/2019  . IR RENAL CYST  11/28/2019  . IR RENAL CYST  11/28/2019  . IR RENAL CYST  11/28/2019  . IR RENAL CYST  11/28/2019  . MULTIPLE TOOTH EXTRACTIONS    . ORIF ANKLE FRACTURE Left 09/13/2018   Procedure: OPEN REDUCTION INTERNAL FIXATION (ORIF) LATERAL LEFT ANKLE FRACTURE;  Surgeon: Dorna Leitz, MD;  Location: [facility];  Service: Orthopedics;  Laterality: Left;  . SYNDESMOSIS REPAIR Left 09/13/2018   Procedure: OPEN REDUCTION INTERNAL FIXATION SYNDESMOSIS REPAIR LEFT ANKLE;  Surgeon: Dorna Leitz, MD;  Location: [facility];  Service: Orthopedics;  Laterality: Left;    FAMILY HISTORY: Family History  Problem Relation Age of Onset  . Heart attack Mother   . Stroke Father   . Diabetes Sister   . Diabetes Brother     SOCIAL HISTORY: Social History   Socioeconomic History  . Marital status: Divorced    Spouse name: Not on file  . Number of children: 2  . Years of education: 71  . Highest education level: Not on file  Occupational History  . Occupation: Retired  Tobacco Use  . Smoking status: Former Smoker    Types: Pipe, Cigars, Cigarettes    Quit date: 10/31/1989    Years since quitting: 30.1  . Smokeless tobacco: Never Used  Vaping Use  . Vaping Use: Never used  Substance and Sexual Activity  . Alcohol use: Yes    Comment: occasional  . Drug use: No  . Sexual activity: Not on file  Other Topics Concern  . Not on file  Social History Narrative   Lives with daughter.   Caffeine use: Drinks coffee daily (1-2 cups)   Right handed    Social Determinants of Health   Financial Resource Strain:   . Difficulty of  Paying Living Expenses:   Food Insecurity:   . Worried About Charity fundraiser in the Last Year:   . Arboriculturist in the Last Year:   Transportation Needs:   . Film/video editor (Medical):   Marland Kitchen Lack of Transportation (Non-Medical):   Physical Activity:   . Days of Exercise per Week:   . Minutes of Exercise per Session:   Stress:   . Feeling of Stress :   Social Connections:   . Frequency of Communication with Friends and Family:   . Frequency of Social Gatherings with Friends and Family:   . Attends Religious Services:   .  Active Member of Clubs or Organizations:   . Attends Archivist Meetings:   Marland Kitchen Marital Status:   Intimate Partner Violence:   . Fear of Current or Ex-Partner:   . Emotionally Abused:   Marland Kitchen Physically Abused:   . Sexually Abused:      PHYSICAL EXAM   There were no vitals filed for this visit. Not recorded     There is no height or weight on file to calculate BMI.  PHYSICAL EXAMNIATION:  Gen: NAD, conversant, well nourised, well groomed  NEUROLOGICAL EXAM:  MENTAL STATUS: Speech/cognition: Awake alert oriented to history taking care of conversation   CRANIAL NERVES: CN II: Visual fields are full to confrontation. Pupils are round equal and briskly reactive to light. CN III, IV, VI: extraocular movement are normal. No ptosis. CN V: Facial sensation is intact to light touch CN VII: Face is symmetric with normal eye closure  CN VIII: Hearing is normal to causal conversation. CN IX, X: Phonation is normal. CN XI: Head turning and shoulder shrug are intact  MOTOR: He has bilateral ankle dorsiflexion weakness, right side is moderate to severe, left side is mild to moderate, mild bilateral hip flexion weakness  REFLEXES: Reflexes are hypoactive and symmetric at the biceps, triceps, knees, and absent at ankles. Plantar responses are flexor.  SENSORY: Length dependent decreased to light touch, pinprick, and vibratory sensation to  ankle level   COORDINATION: Rapid alternating movements and fine finger movements are intact. There is no dysmetria on finger-to-nose and heel-knee-shin.    GAIT/STANCE: He needs pushed up to get up from seated position, dragging his bilateral lower extremity, across the floor, worsening on the right side, rely on his arm to hold onto his  walker   DIAGNOSTIC DATA (LABS, IMAGING, TESTING) - I reviewed patient records, labs, notes, testing and imaging myself where available.   ASSESSMENT AND PLAN  Clare Casto is a 74 y.o. male   Residual neck weakness from lumbosacral radiculopathy, status post lumbar decompression surgery, and cervical decompression surgery for cervical spondylitic myelopathy in 2018 Status post chemotherapy for lymphoma in 2019, Progressive worsening gait abnormality  Today's electrodiagnostic study redemonstrate significant chronic bilateral lumbosacral radiculopathy, right worse than left, this would explain his distal leg weakness, in addition, there is evidence of length dependent severe axonal sensorimotor polyneuropathy, which has progressed since previous study, likely due to his chemo treatment for lymphoma  Emphasized importance of weight loss, keep moderate exercise   Marcial Pacas, M.D. Ph.D.  [facility] [address] [place], [place] [address] Ph: [phone] Fax: [phone]  CC: Deland Pretty, MD

## 2020-01-01 DIAGNOSIS — I10 Essential (primary) hypertension: Secondary | ICD-10-CM | POA: Diagnosis not present

## 2020-01-01 DIAGNOSIS — E1142 Type 2 diabetes mellitus with diabetic polyneuropathy: Secondary | ICD-10-CM | POA: Diagnosis not present

## 2020-01-01 DIAGNOSIS — I25118 Atherosclerotic heart disease of native coronary artery with other forms of angina pectoris: Secondary | ICD-10-CM | POA: Diagnosis not present

## 2020-01-01 DIAGNOSIS — A4151 Sepsis due to Escherichia coli [E. coli]: Secondary | ICD-10-CM | POA: Diagnosis not present

## 2020-01-01 DIAGNOSIS — C9110 Chronic lymphocytic leukemia of B-cell type not having achieved remission: Secondary | ICD-10-CM | POA: Diagnosis not present

## 2020-01-01 DIAGNOSIS — N390 Urinary tract infection, site not specified: Secondary | ICD-10-CM | POA: Diagnosis not present

## 2020-01-01 DIAGNOSIS — M5417 Radiculopathy, lumbosacral region: Secondary | ICD-10-CM | POA: Diagnosis not present

## 2020-01-01 DIAGNOSIS — C8590 Non-Hodgkin lymphoma, unspecified, unspecified site: Secondary | ICD-10-CM | POA: Diagnosis not present

## 2020-01-01 DIAGNOSIS — M4712 Other spondylosis with myelopathy, cervical region: Secondary | ICD-10-CM | POA: Diagnosis not present

## 2020-01-02 NOTE — Procedures (Signed)
Full Name: Ronald Rice Gender: Male MRN #: [id number] Date of Birth: 07/28/1945    Visit Date: 12/31/2019 09:19 Age: 74 Years Examining Physician: Marcial Pacas, MD  Referring Physician: Marcial Pacas, MD Height: 6 feet 2 inch History: 73 year old male with history of lumbar stenosis, status post decompression, distal weakness, gait abnormality, complains of worsening bilateral upper lower extremity paresthesia, weakness following his chemotherapy for lymphoma  Summary of the test:  Nerve conduction study:  Right sural, superficial peroneal, median, ulnar radial sensory responses were absent.  Right peroneal to EDB, tibial motor responses were absent  Right median and ulnar motor responses showed significantly decreased CMAP amplitude, with prolonged distal latency, slow conduction velocity  Electromyography: Selected needle examination of bilateral lower extremity muscles, right upper extremity muscles, bilateral lumbosacral paraspinal muscles, right cervical paraspinal muscles were performed  There was evidence of chronic neuropathic changes involving bilateral lumbar sacral myotomes, L 4,5,s1. in addition, there is evidence of chronic neuropathic changes involving right hand muscle   conclusion: This is an abnormal study.  There is electrodiagnostic evidence of chronic bilateral lumbosacral radiculopathy.  In addition, there is evidence of severe length dependent axonal sensorimotor polyneuropathy.  Compared to previous study in March 2019, there is evidence of progression of his neuropathy.    ------------------------------- Marcial Pacas, M.D. PhD  [facility] [address], [place] [address] Tel: [phone] Fax: [phone]  Verbal informed consent was obtained from the patient, patient was informed of potential risk of procedure, including bruising, bleeding, hematoma formation, infection, muscle weakness, muscle pain, numbness, among others.           [place]    Nerve / Sites Muscle Latency Ref. Amplitude Ref. Rel Amp Segments Distance Velocity Ref. Area    ms ms mV mV %  cm m/s m/s mVms  R Median - APB     Wrist APB 9.8 ?4.4 0.2 ?4.0 100 Wrist - APB 7   0.7     Upper arm APB 14.6  0.4  220 Upper arm - Wrist 26 53 ?49 0.8  R Ulnar - ADM     Wrist ADM 4.2 ?3.3 0.5 ?6.0 100 Wrist - ADM 7   1.6     B.Elbow ADM 9.6  0.2  45.8 B.Elbow - Wrist 24 44 ?49 1.2     A.Elbow ADM 12.1  0.9  444 A.Elbow - B.Elbow 10 41 ?49 8.3         A.Elbow - Wrist      R Peroneal - EDB     Ankle EDB NR ?6.5 NR ?2.0 NR Ankle - EDB 9   NR     Fib head EDB NR  NR  NR Fib head - Ankle 29 NR ?44 NR  R Tibial - AH     Ankle AH NR ?5.8 NR ?4.0 NR Ankle - AH 9 NR  NR             SNC    Nerve / Sites Rec. Site Peak Lat Ref.  Amp Ref. Segments Distance    ms ms V V  cm  R Radial - Anatomical snuff box (Forearm)     Forearm Wrist NR ?2.9 NR ?15 Forearm - Wrist 10  R Sural - Ankle (Calf)     Calf Ankle NR ?4.4 NR ?6 Calf - Ankle 14  R Superficial peroneal - Ankle     Lat leg Ankle NR ?4.4 NR ?6 Lat leg -  Ankle 14  R Median - Orthodromic (Dig II, Mid palm)     Dig II Wrist NR ?3.4 NR ?10 Dig II - Wrist 13  R Ulnar - Orthodromic, (Dig V, Mid palm)     Dig V Wrist NR ?3.1 NR ?5 Dig V - Wrist 34               F  Wave    Nerve F Lat Ref.   ms ms  R Ulnar - ADM 34.8 ?32.0       EMG Summary Table    Spontaneous MUAP Recruitment  Muscle IA Fib PSW Fasc Other Amp Dur. Poly Pattern  R. Tibialis anterior Increased 3+ 3+ None _______ Increased Increased 3+   R. Tibialis posterior Increased 1+ 1+ None _______ Increased Increased Normal Reduced  R. Peroneus longus Increased 3+ 3+ None _______ Increased Increased Normal Reduced  R. Vastus lateralis Increased None None None _______ Increased Increased 1+ Reduced  L. Tibialis anterior Increased 2+ 2+ None _______ Increased Increased 1+ Reduced  L. Tibialis posterior Increased 1+ None None _______ Increased Increased  1+ Reduced  L. Peroneus longus Increased 2+ 2+ None _______ Increased Increased 1+ Reduced  L. Gastrocnemius (Medial head) Increased 1+ 1+ None _______ Increased Increased 1+ Reduced  L. Vastus lateralis Increased None None None _______ Increased Increased 1+ Reduced  R. Gastrocnemius (Medial head) Increased 1+ 1+ None _______ Increased Increased 1+ Reduced  R. Lumbar paraspinals (low) Increased 1+ 1+ None _______ Normal Normal Normal Normal  R. Lumbar paraspinals (mid) Increased 1+ 1+ None _______ Normal Normal Normal Normal  L. Lumbar paraspinals (low) Increased None None None _______ Normal Normal Normal Normal  L. Lumbar paraspinals (mid) Increased None None None _______ Normal Normal Normal Normal  R. First dorsal interosseous Increased None None None _______ Increased Increased 1+ Reduced  R. Pronator teres Normal None None None _______ Normal Normal Normal Normal  R. Biceps brachii Normal None None None _______ Normal Normal Normal Normal  R. Deltoid Normal None None None _______ Normal Normal Normal Normal  R. Extensor digitorum communis Normal None None None _______ Normal Normal Normal Normal  R. Cervical paraspinals Normal None None None _______ Normal Normal Normal Normal

## 2020-01-04 DIAGNOSIS — E1142 Type 2 diabetes mellitus with diabetic polyneuropathy: Secondary | ICD-10-CM | POA: Diagnosis not present

## 2020-01-04 DIAGNOSIS — M5417 Radiculopathy, lumbosacral region: Secondary | ICD-10-CM | POA: Diagnosis not present

## 2020-01-04 DIAGNOSIS — I10 Essential (primary) hypertension: Secondary | ICD-10-CM | POA: Diagnosis not present

## 2020-01-04 DIAGNOSIS — N390 Urinary tract infection, site not specified: Secondary | ICD-10-CM | POA: Diagnosis not present

## 2020-01-04 DIAGNOSIS — C8590 Non-Hodgkin lymphoma, unspecified, unspecified site: Secondary | ICD-10-CM | POA: Diagnosis not present

## 2020-01-04 DIAGNOSIS — M4712 Other spondylosis with myelopathy, cervical region: Secondary | ICD-10-CM | POA: Diagnosis not present

## 2020-01-04 DIAGNOSIS — C9110 Chronic lymphocytic leukemia of B-cell type not having achieved remission: Secondary | ICD-10-CM | POA: Diagnosis not present

## 2020-01-04 DIAGNOSIS — A4151 Sepsis due to Escherichia coli [E. coli]: Secondary | ICD-10-CM | POA: Diagnosis not present

## 2020-01-04 DIAGNOSIS — I25118 Atherosclerotic heart disease of native coronary artery with other forms of angina pectoris: Secondary | ICD-10-CM | POA: Diagnosis not present

## 2020-01-08 ENCOUNTER — Telehealth: Payer: Self-pay | Admitting: Neurology

## 2020-01-08 ENCOUNTER — Ambulatory Visit: Payer: Self-pay | Admitting: Adult Health

## 2020-01-08 NOTE — Telephone Encounter (Signed)
Pt's wife called wanting to know if provider can call pt's muscle relaxer in to the [facility] on [address] She could not remember the name of the medication. Please advise.

## 2020-01-08 NOTE — Telephone Encounter (Signed)
The patient has no active muscle relaxer in his medication list. I called [facility] who stated the last prescription they had on file is for tizanidine 4mg , one tab BID (last filled 10/28/2018). This was not prescribed by our office. I called his wife to ask about this medication. She is placing a call to his PCP for refills. I invited her to call our office back, if she has any further needs or concerns.

## 2020-01-09 ENCOUNTER — Encounter: Payer: Self-pay | Admitting: Adult Health

## 2020-01-10 ENCOUNTER — Ambulatory Visit: Payer: Self-pay | Admitting: Family Medicine

## 2020-01-10 DIAGNOSIS — M5417 Radiculopathy, lumbosacral region: Secondary | ICD-10-CM | POA: Diagnosis not present

## 2020-01-10 DIAGNOSIS — E1142 Type 2 diabetes mellitus with diabetic polyneuropathy: Secondary | ICD-10-CM | POA: Diagnosis not present

## 2020-01-10 DIAGNOSIS — I10 Essential (primary) hypertension: Secondary | ICD-10-CM | POA: Diagnosis not present

## 2020-01-10 DIAGNOSIS — C9110 Chronic lymphocytic leukemia of B-cell type not having achieved remission: Secondary | ICD-10-CM | POA: Diagnosis not present

## 2020-01-10 DIAGNOSIS — N390 Urinary tract infection, site not specified: Secondary | ICD-10-CM | POA: Diagnosis not present

## 2020-01-10 DIAGNOSIS — C8590 Non-Hodgkin lymphoma, unspecified, unspecified site: Secondary | ICD-10-CM | POA: Diagnosis not present

## 2020-01-10 DIAGNOSIS — I25118 Atherosclerotic heart disease of native coronary artery with other forms of angina pectoris: Secondary | ICD-10-CM | POA: Diagnosis not present

## 2020-01-10 DIAGNOSIS — M4712 Other spondylosis with myelopathy, cervical region: Secondary | ICD-10-CM | POA: Diagnosis not present

## 2020-01-10 DIAGNOSIS — A4151 Sepsis due to Escherichia coli [E. coli]: Secondary | ICD-10-CM | POA: Diagnosis not present

## 2020-01-11 DIAGNOSIS — A4151 Sepsis due to Escherichia coli [E. coli]: Secondary | ICD-10-CM | POA: Diagnosis not present

## 2020-01-11 DIAGNOSIS — N390 Urinary tract infection, site not specified: Secondary | ICD-10-CM | POA: Diagnosis not present

## 2020-01-11 DIAGNOSIS — M4712 Other spondylosis with myelopathy, cervical region: Secondary | ICD-10-CM | POA: Diagnosis not present

## 2020-01-11 DIAGNOSIS — M5417 Radiculopathy, lumbosacral region: Secondary | ICD-10-CM | POA: Diagnosis not present

## 2020-01-14 ENCOUNTER — Other Ambulatory Visit: Payer: Self-pay

## 2020-01-14 ENCOUNTER — Encounter: Payer: Self-pay | Admitting: Adult Health

## 2020-01-14 ENCOUNTER — Ambulatory Visit: Payer: Medicare HMO | Admitting: Adult Health

## 2020-01-14 VITALS — BP 126/76 | HR 88 | Ht 74.0 in | Wt 286.0 lb

## 2020-01-14 DIAGNOSIS — Z9989 Dependence on other enabling machines and devices: Secondary | ICD-10-CM | POA: Diagnosis not present

## 2020-01-14 DIAGNOSIS — G4733 Obstructive sleep apnea (adult) (pediatric): Secondary | ICD-10-CM

## 2020-01-14 NOTE — Progress Notes (Signed)
PATIENT: Ronald Rice DOB: Aug 31, 1945  REASON FOR VISIT: follow up HISTORY FROM: patient  HISTORY OF PRESENT ILLNESS: Today 01/14/20: Mr. Bomar is a 74 year old male with a history of obstructive sleep apnea on CPAP.  His download indicates that he use his machine nightly for compliance of 100%.  He uses machine greater than 4 hours 29 days for compliance of 97%.  On average he uses his machine 9 hours and 20 minutes.  His residual AHI is 7.3 on 18/9 centimeters of water with pressure support of 4.  His leak in the 95th percentile is 2.6 L/min.  Reports that the CPAP is working well for him.  He returns today for evaluation.  HISTORY Ronald Rice is a 74 y.o. male patient of Dr. Krista Blue and Deland Pretty, MD , and was seen here for sleep evaluation on 06-28-2018 upon referral from Dr. Shelia Media. He now returns after he couldn't tolerate CPAP . His wife is here with him. He has undergone a sleep study on 07-17-2019: The sleep study showed a very high apnea-hypopnea index of 71 an hour.  The patient had actually worse apnea in non-REM sleep than in rem sleep.  The apnea was slightly accentuated with in supine position.  His wife reports that whenever he is on his back he snores loud and she notices no pauses in his breathing.  Let me just recap here that the patient had 232 respiratory events 30 classic obstructive apneas 55 central apneas 28 mixed apneas this is a very complex and more difficult to treat apnea but may not respond to simple CPAP. He couldn't get in for a PAP titration ,and due to the pandemic ended up , frustratingly - on auto CPAP.  His energy level is low, his wife feels he sleeps poorly, he is restless. She witnessed a lot of long apneas.      REVIEW OF SYSTEMS: Out of a complete 14 system review of symptoms, the patient complains only of the following symptoms, and all other reviewed systems are negative.  FSS 45  ESS 12  ALLERGIES: No Known Allergies  HOME  MEDICATIONS: Outpatient Medications Prior to Visit  Medication Sig Dispense Refill  . acetaminophen (TYLENOL) 500 MG tablet Take 1,000 mg by mouth every 6 (six) hours as needed (pain).     Marland Kitchen allopurinol (ZYLOPRIM) 300 MG tablet Take 300 mg by mouth daily.    Marland Kitchen aspirin EC 81 MG tablet Take 81 mg by mouth daily.    Marland Kitchen atenolol (TENORMIN) 25 MG tablet Take 25 mg by mouth daily.     Marland Kitchen atorvastatin (LIPITOR) 40 MG tablet Take 40 mg by mouth daily.    . Cholecalciferol (VITAMIN D3 PO) Take 1 tablet by mouth daily.    Marland Kitchen CINNAMON PO Take 1 tablet by mouth daily.    . diclofenac sodium (VOLTAREN) 1 % GEL Apply 1 application topically as needed (pain).     Marland Kitchen donepezil (ARICEPT ODT) 5 MG disintegrating tablet Take 5 mg by mouth at bedtime.    . DULoxetine (CYMBALTA) 60 MG capsule TAKE 1 CAPSULE(60 MG) BY MOUTH DAILY 90 capsule 3  . famotidine (PEPCID) 20 MG tablet Take 20 mg by mouth at bedtime as needed for heartburn.     Marland Kitchen ipratropium (ATROVENT) 0.06 % nasal spray Place 1 spray into both nostrils daily as needed for rhinitis.     . Lancets (ONETOUCH DELICA PLUS [id number]) MISC     . lidocaine-prilocaine (EMLA) cream Apply to PAC 1  hour prior to procedure. 30 g 3  . lisinopril (ZESTRIL) 40 MG tablet Take 1 tablet (40 mg total) by mouth daily for 30 days. 30 tablet 0  . metFORMIN (GLUCOPHAGE-XR) 500 MG 24 hr tablet Take 500 mg by mouth every evening.     . NON FORMULARY Take 2 tablets by mouth daily. Blood Sugar Defense     . potassium chloride (K-DUR) 10 MEQ tablet Take 10 mEq by mouth daily.     Marland Kitchen pyridoxine (B-6) 250 MG tablet Take 250 mg by mouth daily.    . tamsulosin (FLOMAX) 0.4 MG CAPS capsule Take 0.4 mg by mouth daily.    . traMADol (ULTRAM) 50 MG tablet TAKE 1 TABLET BY MOUTH EVERY 6 HOURS AS NEEDED (Patient taking differently: Take 50 mg by mouth every 6 (six) hours as needed for moderate pain. ) 90 tablet 0   No facility-administered medications prior to visit.    PAST MEDICAL  HISTORY: Past Medical History:  Diagnosis Date  . Cancer ([place])    Leukemia   . Cervical myelopathy ([place])   . Chronic sinusitis   . CLL (chronic lymphocytic leukemia) ([place])    see records in care everywhere from [place]  . Coronary artery disease    minimal nonobstructive by 10/31/16 cath at [facility] in [place]  . Diabetes mellitus without complication ([place])   . Diffuse large B-cell lymphoma of lymph nodes of multiple regions ([place]) 10/31/2018  . Diverticulosis   . Erectile dysfunction   . Fracture    left ankle fracture with retained hardware  . GERD (gastroesophageal reflux disease)   . Goals of care, counseling/discussion 10/31/2018  . Gout   . Hyperlipemia   . Hypertension   . Lumbar radiculopathy   . Memory loss   . OSA (obstructive sleep apnea)   . Peripheral neuropathy   . Renal cyst   . Sleep apnea    does not wear CPAP  . Thyroid nodule   . Wears dentures   . Wears glasses     PAST SURGICAL HISTORY: Past Surgical History:  Procedure Laterality Date  . ANKLE RECONSTRUCTION Left 12/19/2018   Procedure: LEFT ANKLE REVISION FIXATION, REMOVAL OF HARDWARE, OPEN TREATMENT, SYNDESMOSIS, POSSIBLE ANKLE ARTHROTOMY, DELTOID LIGAMENT REPAIR;  Surgeon: Erle Crocker, MD;  Location: [place];  Service: Orthopedics;  Laterality: Left;  . ANTERIOR CERVICAL DECOMP/DISCECTOMY FUSION N/A 04/20/2017   Procedure: ANTERIOR CERVICAL DECOMPRESSION FUSION, CERVICAL 3-4, CERVICAL 4-5 WITH INSTRUMENTATION AND ALLOGRAFT; REQUEST 3.5 HOURS;  Surgeon: Phylliss Bob, MD;  Location: [place];  Service: Orthopedics;  Laterality: N/A;  ANTERIOR CERVICAL DECOMPRESSION FUSION, CERVICAL 3-4, CERVICAL 4-5 WITH INSTRUMENTATION AND ALLOGRAFT; REQUEST 3.5 HOURS  . BACK SURGERY    . CARDIAC CATHETERIZATION     in Care Everywhere 11/01/16  . COLONOSCOPY    . IR IMAGING GUIDED PORT INSERTION  11/10/2018  . IR RADIOLOGIST EVAL & MGMT  10/16/2019  . IR RADIOLOGIST EVAL & MGMT  12/20/2019  . IR RENAL CYST  11/28/2019   . IR RENAL CYST  11/28/2019  . IR RENAL CYST  11/28/2019  . IR RENAL CYST  11/28/2019  . MULTIPLE TOOTH EXTRACTIONS    . ORIF ANKLE FRACTURE Left 09/13/2018   Procedure: OPEN REDUCTION INTERNAL FIXATION (ORIF) LATERAL LEFT ANKLE FRACTURE;  Surgeon: Dorna Leitz, MD;  Location: [facility];  Service: Orthopedics;  Laterality: Left;  . SYNDESMOSIS REPAIR Left 09/13/2018   Procedure: OPEN REDUCTION INTERNAL FIXATION SYNDESMOSIS REPAIR LEFT ANKLE;  Surgeon: Dorna Leitz, MD;  Location: [facility];  Service: Orthopedics;  Laterality: Left;    FAMILY HISTORY: Family History  Problem Relation Age of Onset  . Heart attack Mother   . Stroke Father   . Diabetes Sister   . Diabetes Brother     SOCIAL HISTORY: Social History   Socioeconomic History  . Marital status: Divorced    Spouse name: Not on file  . Number of children: 2  . Years of education: 24  . Highest education level: Not on file  Occupational History  . Occupation: Retired  Tobacco Use  . Smoking status: Former Smoker    Types: Pipe, Cigars, Cigarettes    Quit date: 10/31/1989    Years since quitting: 30.2  . Smokeless tobacco: Never Used  Vaping Use  . Vaping Use: Never used  Substance and Sexual Activity  . Alcohol use: Yes    Comment: occasional  . Drug use: No  . Sexual activity: Not on file  Other Topics Concern  . Not on file  Social History Narrative   Lives with daughter.   Caffeine use: Drinks coffee daily (1-2 cups)   Right handed    Social Determinants of Health   Financial Resource Strain:   . Difficulty of Paying Living Expenses: Not on file  Food Insecurity:   . Worried About Charity fundraiser in the Last Year: Not on file  . Ran Out of Food in the Last Year: Not on file  Transportation Needs:   . Lack of Transportation (Medical): Not on file  . Lack of Transportation (Non-Medical): Not on file  Physical Activity:   . Days of Exercise per Week: Not on file  . Minutes of Exercise per Session: Not on file   Stress:   . Feeling of Stress : Not on file  Social Connections:   . Frequency of Communication with Friends and Family: Not on file  . Frequency of Social Gatherings with Friends and Family: Not on file  . Attends Religious Services: Not on file  . Active Member of Clubs or Organizations: Not on file  . Attends Archivist Meetings: Not on file  . Marital Status: Not on file  Intimate Partner Violence:   . Fear of Current or Ex-Partner: Not on file  . Emotionally Abused: Not on file  . Physically Abused: Not on file  . Sexually Abused: Not on file      PHYSICAL EXAM  Vitals:   01/14/20 1242  BP: 126/76  Pulse: 88  Weight: 286 lb (129.7 kg)  Height: 6\' 2"  (1.88 m)   Body mass index is 36.72 kg/m.  Generalized: Well developed, in no acute distress  Chest: Lungs clear to auscultation bilaterally  Neurological examination  Mentation: Alert oriented to time, place, history taking. Follows all commands speech and language fluent Cranial nerve II-XII: Extraocular movements were full, visual field were full on confrontational test Head turning and shoulder shrug  were normal and symmetric. Motor: The motor testing reveals 5 over 5 strength of all 4 extremities. Good symmetric motor tone is noted throughout.  Sensory: Sensory testing is intact to soft touch on all 4 extremities. No evidence of extinction is noted.  Gait and station: Gait is normal.    DIAGNOSTIC DATA (LABS, IMAGING, TESTING) - I reviewed patient records, labs, notes, testing and imaging myself where available.  Lab Results  Component Value Date   WBC 3.0 (L) 11/28/2019   HGB 12.3 (L) 11/28/2019   HCT 35.4 (L) 11/28/2019  MCV 87.6 11/28/2019   PLT 139 (L) 11/28/2019      Component Value Date/Time   NA 142 10/18/2019 0915   NA 142 01/25/2017 1217   K 3.8 10/18/2019 0915   K 4.1 01/25/2017 1217   CL 107 10/18/2019 0915   CL 102 01/25/2017 1217   CO2 26 10/18/2019 0915   CO2 33 01/25/2017  1217   GLUCOSE 198 (H) 10/18/2019 0915   GLUCOSE 128 (H) 01/25/2017 1217   BUN 14 10/18/2019 0915   BUN 12 01/25/2017 1217   CREATININE 0.98 10/18/2019 0915   CREATININE 1.4 (H) 01/25/2017 1217   CALCIUM 9.2 10/18/2019 0915   CALCIUM 9.7 01/25/2017 1217   PROT 6.3 (L) 10/18/2019 0915   PROT 7.2 01/25/2017 1217   ALBUMIN 4.1 10/18/2019 0915   ALBUMIN 4.2 01/25/2017 1217   AST 15 10/18/2019 0915   ALT 16 10/18/2019 0915   ALT 32 01/25/2017 1217   ALKPHOS 66 10/18/2019 0915   ALKPHOS 59 01/25/2017 1217   BILITOT 0.4 10/18/2019 0915   GFRNONAA >60 10/18/2019 0915   GFRAA >60 10/18/2019 0915   Lab Results  Component Value Date   CHOL 90 11/23/2017   HDL 27 (L) 11/23/2017   LDLCALC 49 11/23/2017   TRIG 72 11/23/2017   CHOLHDL 3.3 11/23/2017   Lab Results  Component Value Date   HGBA1C 6.8 (H) 10/18/2019   Lab Results  Component Value Date   VITAMINB12 538 08/07/2019   Lab Results  Component Value Date   TSH 0.794 08/07/2019      ASSESSMENT AND PLAN 74 y.o. year old male  has a past medical history of Cancer ([place]), Cervical myelopathy ([place]), Chronic sinusitis, CLL (chronic lymphocytic leukemia) ([place]), Coronary artery disease, Diabetes mellitus without complication ([place]), Diffuse large B-cell lymphoma of lymph nodes of multiple regions ([place]) (10/31/2018), Diverticulosis, Erectile dysfunction, Fracture, GERD (gastroesophageal reflux disease), Goals of care, counseling/discussion (10/31/2018), Gout, Hyperlipemia, Hypertension, Lumbar radiculopathy, Memory loss, OSA (obstructive sleep apnea), Peripheral neuropathy, Renal cyst, Sleep apnea, Thyroid nodule, Wears dentures, and Wears glasses. here with:  1. OSA on CPAP  - CPAP compliance excellent - Good treatment of AHI  - Encourage patient to use CPAP nightly and > 4 hours each night - F/U in 1 year or sooner if needed   I spent 20 minutes of face-to-face and non-face-to-face time with patient.  This included previsit chart  review, lab review, study review, order entry, electronic health record documentation, patient education.  Ward Givens, MSN, NP-C 01/14/2020, 1:13 PM [facility] [address] [place], [place] [address] [phone]

## 2020-01-14 NOTE — Patient Instructions (Signed)
Your Plan: Continue using BiPAP nightly and greater than 4 hours each night If your symptoms worsen or you develop new symptoms please let us know.        Thank you for coming to see [place] at [facility]. I hope we have been able to provide you high quality care today.  You may receive a patient satisfaction survey over the next few weeks. We would appreciate your feedback and comments so that we may continue to improve ourselves and the health of our patients.

## 2020-01-15 DIAGNOSIS — C9110 Chronic lymphocytic leukemia of B-cell type not having achieved remission: Secondary | ICD-10-CM | POA: Diagnosis not present

## 2020-01-15 DIAGNOSIS — E1142 Type 2 diabetes mellitus with diabetic polyneuropathy: Secondary | ICD-10-CM | POA: Diagnosis not present

## 2020-01-15 DIAGNOSIS — A4151 Sepsis due to Escherichia coli [E. coli]: Secondary | ICD-10-CM | POA: Diagnosis not present

## 2020-01-15 DIAGNOSIS — C8590 Non-Hodgkin lymphoma, unspecified, unspecified site: Secondary | ICD-10-CM | POA: Diagnosis not present

## 2020-01-15 DIAGNOSIS — M4712 Other spondylosis with myelopathy, cervical region: Secondary | ICD-10-CM | POA: Diagnosis not present

## 2020-01-15 DIAGNOSIS — M5417 Radiculopathy, lumbosacral region: Secondary | ICD-10-CM | POA: Diagnosis not present

## 2020-01-15 DIAGNOSIS — I10 Essential (primary) hypertension: Secondary | ICD-10-CM | POA: Diagnosis not present

## 2020-01-15 DIAGNOSIS — N390 Urinary tract infection, site not specified: Secondary | ICD-10-CM | POA: Diagnosis not present

## 2020-01-15 DIAGNOSIS — I25118 Atherosclerotic heart disease of native coronary artery with other forms of angina pectoris: Secondary | ICD-10-CM | POA: Diagnosis not present

## 2020-01-16 DIAGNOSIS — I25118 Atherosclerotic heart disease of native coronary artery with other forms of angina pectoris: Secondary | ICD-10-CM | POA: Diagnosis not present

## 2020-01-16 DIAGNOSIS — C8590 Non-Hodgkin lymphoma, unspecified, unspecified site: Secondary | ICD-10-CM | POA: Diagnosis not present

## 2020-01-16 DIAGNOSIS — E1142 Type 2 diabetes mellitus with diabetic polyneuropathy: Secondary | ICD-10-CM | POA: Diagnosis not present

## 2020-01-16 DIAGNOSIS — M5417 Radiculopathy, lumbosacral region: Secondary | ICD-10-CM | POA: Diagnosis not present

## 2020-01-16 DIAGNOSIS — C9110 Chronic lymphocytic leukemia of B-cell type not having achieved remission: Secondary | ICD-10-CM | POA: Diagnosis not present

## 2020-01-16 DIAGNOSIS — N390 Urinary tract infection, site not specified: Secondary | ICD-10-CM | POA: Diagnosis not present

## 2020-01-16 DIAGNOSIS — M4712 Other spondylosis with myelopathy, cervical region: Secondary | ICD-10-CM | POA: Diagnosis not present

## 2020-01-16 DIAGNOSIS — I10 Essential (primary) hypertension: Secondary | ICD-10-CM | POA: Diagnosis not present

## 2020-01-16 DIAGNOSIS — A4151 Sepsis due to Escherichia coli [E. coli]: Secondary | ICD-10-CM | POA: Diagnosis not present

## 2020-01-22 DIAGNOSIS — N401 Enlarged prostate with lower urinary tract symptoms: Secondary | ICD-10-CM | POA: Diagnosis not present

## 2020-01-22 DIAGNOSIS — R339 Retention of urine, unspecified: Secondary | ICD-10-CM | POA: Diagnosis not present

## 2020-01-22 DIAGNOSIS — E291 Testicular hypofunction: Secondary | ICD-10-CM | POA: Diagnosis not present

## 2020-01-22 DIAGNOSIS — N281 Cyst of kidney, acquired: Secondary | ICD-10-CM | POA: Diagnosis not present

## 2020-01-22 DIAGNOSIS — N138 Other obstructive and reflux uropathy: Secondary | ICD-10-CM | POA: Diagnosis not present

## 2020-01-22 DIAGNOSIS — R358 Other polyuria: Secondary | ICD-10-CM | POA: Diagnosis not present

## 2020-01-22 DIAGNOSIS — T387X5A Adverse effect of androgens and anabolic congeners, initial encounter: Secondary | ICD-10-CM | POA: Diagnosis not present

## 2020-01-25 DIAGNOSIS — M4712 Other spondylosis with myelopathy, cervical region: Secondary | ICD-10-CM | POA: Diagnosis not present

## 2020-01-25 DIAGNOSIS — A4151 Sepsis due to Escherichia coli [E. coli]: Secondary | ICD-10-CM | POA: Diagnosis not present

## 2020-01-25 DIAGNOSIS — E1142 Type 2 diabetes mellitus with diabetic polyneuropathy: Secondary | ICD-10-CM | POA: Diagnosis not present

## 2020-01-25 DIAGNOSIS — I25118 Atherosclerotic heart disease of native coronary artery with other forms of angina pectoris: Secondary | ICD-10-CM | POA: Diagnosis not present

## 2020-01-25 DIAGNOSIS — C8590 Non-Hodgkin lymphoma, unspecified, unspecified site: Secondary | ICD-10-CM | POA: Diagnosis not present

## 2020-01-25 DIAGNOSIS — I10 Essential (primary) hypertension: Secondary | ICD-10-CM | POA: Diagnosis not present

## 2020-01-25 DIAGNOSIS — M5417 Radiculopathy, lumbosacral region: Secondary | ICD-10-CM | POA: Diagnosis not present

## 2020-01-25 DIAGNOSIS — N390 Urinary tract infection, site not specified: Secondary | ICD-10-CM | POA: Diagnosis not present

## 2020-01-25 DIAGNOSIS — C9110 Chronic lymphocytic leukemia of B-cell type not having achieved remission: Secondary | ICD-10-CM | POA: Diagnosis not present

## 2020-01-29 DIAGNOSIS — A4151 Sepsis due to Escherichia coli [E. coli]: Secondary | ICD-10-CM | POA: Diagnosis not present

## 2020-01-29 DIAGNOSIS — G4733 Obstructive sleep apnea (adult) (pediatric): Secondary | ICD-10-CM | POA: Diagnosis not present

## 2020-01-29 DIAGNOSIS — E1142 Type 2 diabetes mellitus with diabetic polyneuropathy: Secondary | ICD-10-CM | POA: Diagnosis not present

## 2020-01-29 DIAGNOSIS — I10 Essential (primary) hypertension: Secondary | ICD-10-CM | POA: Diagnosis not present

## 2020-01-29 DIAGNOSIS — C9110 Chronic lymphocytic leukemia of B-cell type not having achieved remission: Secondary | ICD-10-CM | POA: Diagnosis not present

## 2020-01-29 DIAGNOSIS — M5417 Radiculopathy, lumbosacral region: Secondary | ICD-10-CM | POA: Diagnosis not present

## 2020-01-29 DIAGNOSIS — C8590 Non-Hodgkin lymphoma, unspecified, unspecified site: Secondary | ICD-10-CM | POA: Diagnosis not present

## 2020-01-29 DIAGNOSIS — I25118 Atherosclerotic heart disease of native coronary artery with other forms of angina pectoris: Secondary | ICD-10-CM | POA: Diagnosis not present

## 2020-01-29 DIAGNOSIS — N390 Urinary tract infection, site not specified: Secondary | ICD-10-CM | POA: Diagnosis not present

## 2020-01-29 DIAGNOSIS — M4712 Other spondylosis with myelopathy, cervical region: Secondary | ICD-10-CM | POA: Diagnosis not present

## 2020-01-30 DIAGNOSIS — G4733 Obstructive sleep apnea (adult) (pediatric): Secondary | ICD-10-CM | POA: Diagnosis not present

## 2020-01-30 DIAGNOSIS — G4737 Central sleep apnea in conditions classified elsewhere: Secondary | ICD-10-CM | POA: Diagnosis not present

## 2020-02-05 DIAGNOSIS — H16223 Keratoconjunctivitis sicca, not specified as Sjogren's, bilateral: Secondary | ICD-10-CM | POA: Diagnosis not present

## 2020-02-05 DIAGNOSIS — H40013 Open angle with borderline findings, low risk, bilateral: Secondary | ICD-10-CM | POA: Diagnosis not present

## 2020-02-05 DIAGNOSIS — E119 Type 2 diabetes mellitus without complications: Secondary | ICD-10-CM | POA: Diagnosis not present

## 2020-02-05 DIAGNOSIS — H25813 Combined forms of age-related cataract, bilateral: Secondary | ICD-10-CM | POA: Diagnosis not present

## 2020-02-06 DIAGNOSIS — I10 Essential (primary) hypertension: Secondary | ICD-10-CM | POA: Diagnosis not present

## 2020-02-06 DIAGNOSIS — M5417 Radiculopathy, lumbosacral region: Secondary | ICD-10-CM | POA: Diagnosis not present

## 2020-02-06 DIAGNOSIS — C8590 Non-Hodgkin lymphoma, unspecified, unspecified site: Secondary | ICD-10-CM | POA: Diagnosis not present

## 2020-02-06 DIAGNOSIS — C9110 Chronic lymphocytic leukemia of B-cell type not having achieved remission: Secondary | ICD-10-CM | POA: Diagnosis not present

## 2020-02-06 DIAGNOSIS — M4712 Other spondylosis with myelopathy, cervical region: Secondary | ICD-10-CM | POA: Diagnosis not present

## 2020-02-06 DIAGNOSIS — E1142 Type 2 diabetes mellitus with diabetic polyneuropathy: Secondary | ICD-10-CM | POA: Diagnosis not present

## 2020-02-06 DIAGNOSIS — N390 Urinary tract infection, site not specified: Secondary | ICD-10-CM | POA: Diagnosis not present

## 2020-02-06 DIAGNOSIS — I25118 Atherosclerotic heart disease of native coronary artery with other forms of angina pectoris: Secondary | ICD-10-CM | POA: Diagnosis not present

## 2020-02-06 DIAGNOSIS — A4151 Sepsis due to Escherichia coli [E. coli]: Secondary | ICD-10-CM | POA: Diagnosis not present

## 2020-02-13 DIAGNOSIS — E1142 Type 2 diabetes mellitus with diabetic polyneuropathy: Secondary | ICD-10-CM | POA: Diagnosis not present

## 2020-02-13 DIAGNOSIS — N390 Urinary tract infection, site not specified: Secondary | ICD-10-CM | POA: Diagnosis not present

## 2020-02-13 DIAGNOSIS — M5417 Radiculopathy, lumbosacral region: Secondary | ICD-10-CM | POA: Diagnosis not present

## 2020-02-13 DIAGNOSIS — M4712 Other spondylosis with myelopathy, cervical region: Secondary | ICD-10-CM | POA: Diagnosis not present

## 2020-02-13 DIAGNOSIS — C8590 Non-Hodgkin lymphoma, unspecified, unspecified site: Secondary | ICD-10-CM | POA: Diagnosis not present

## 2020-02-13 DIAGNOSIS — C9110 Chronic lymphocytic leukemia of B-cell type not having achieved remission: Secondary | ICD-10-CM | POA: Diagnosis not present

## 2020-02-13 DIAGNOSIS — I25118 Atherosclerotic heart disease of native coronary artery with other forms of angina pectoris: Secondary | ICD-10-CM | POA: Diagnosis not present

## 2020-02-13 DIAGNOSIS — I10 Essential (primary) hypertension: Secondary | ICD-10-CM | POA: Diagnosis not present

## 2020-02-13 DIAGNOSIS — A4151 Sepsis due to Escherichia coli [E. coli]: Secondary | ICD-10-CM | POA: Diagnosis not present

## 2020-02-18 DIAGNOSIS — G4733 Obstructive sleep apnea (adult) (pediatric): Secondary | ICD-10-CM | POA: Diagnosis not present

## 2020-02-21 DIAGNOSIS — E785 Hyperlipidemia, unspecified: Secondary | ICD-10-CM | POA: Diagnosis not present

## 2020-02-21 DIAGNOSIS — Z23 Encounter for immunization: Secondary | ICD-10-CM | POA: Diagnosis not present

## 2020-02-21 DIAGNOSIS — M109 Gout, unspecified: Secondary | ICD-10-CM | POA: Diagnosis not present

## 2020-02-21 DIAGNOSIS — R61 Generalized hyperhidrosis: Secondary | ICD-10-CM | POA: Diagnosis not present

## 2020-02-21 DIAGNOSIS — E1140 Type 2 diabetes mellitus with diabetic neuropathy, unspecified: Secondary | ICD-10-CM | POA: Diagnosis not present

## 2020-02-21 DIAGNOSIS — I10 Essential (primary) hypertension: Secondary | ICD-10-CM | POA: Diagnosis not present

## 2020-02-26 ENCOUNTER — Encounter: Payer: Medicare HMO | Admitting: Gastroenterology

## 2020-02-28 ENCOUNTER — Inpatient Hospital Stay: Payer: Medicare HMO

## 2020-02-28 ENCOUNTER — Inpatient Hospital Stay: Payer: Medicare HMO | Attending: Hematology & Oncology | Admitting: Hematology & Oncology

## 2020-02-28 ENCOUNTER — Other Ambulatory Visit: Payer: Self-pay

## 2020-02-28 ENCOUNTER — Encounter: Payer: Self-pay | Admitting: Hematology & Oncology

## 2020-02-28 VITALS — BP 159/85 | HR 85 | Temp 98.0°F | Resp 17 | Wt 284.0 lb

## 2020-02-28 DIAGNOSIS — C9110 Chronic lymphocytic leukemia of B-cell type not having achieved remission: Secondary | ICD-10-CM | POA: Insufficient documentation

## 2020-02-28 DIAGNOSIS — G629 Polyneuropathy, unspecified: Secondary | ICD-10-CM | POA: Diagnosis not present

## 2020-02-28 DIAGNOSIS — Z7984 Long term (current) use of oral hypoglycemic drugs: Secondary | ICD-10-CM | POA: Insufficient documentation

## 2020-02-28 DIAGNOSIS — C8338 Diffuse large B-cell lymphoma, lymph nodes of multiple sites: Secondary | ICD-10-CM

## 2020-02-28 DIAGNOSIS — Z7982 Long term (current) use of aspirin: Secondary | ICD-10-CM | POA: Insufficient documentation

## 2020-02-28 DIAGNOSIS — Z452 Encounter for adjustment and management of vascular access device: Secondary | ICD-10-CM | POA: Diagnosis not present

## 2020-02-28 DIAGNOSIS — Z79899 Other long term (current) drug therapy: Secondary | ICD-10-CM | POA: Insufficient documentation

## 2020-02-28 DIAGNOSIS — Z9221 Personal history of antineoplastic chemotherapy: Secondary | ICD-10-CM | POA: Diagnosis not present

## 2020-02-28 DIAGNOSIS — Z95828 Presence of other vascular implants and grafts: Secondary | ICD-10-CM

## 2020-02-28 LAB — CBC WITH DIFFERENTIAL (CANCER CENTER ONLY)
Abs Immature Granulocytes: 0.01 10*3/uL (ref 0.00–0.07)
Basophils Absolute: 0 10*3/uL (ref 0.0–0.1)
Basophils Relative: 1 %
Eosinophils Absolute: 0.2 10*3/uL (ref 0.0–0.5)
Eosinophils Relative: 4 %
HCT: 37.2 % — ABNORMAL LOW (ref 39.0–52.0)
Hemoglobin: 13.4 g/dL (ref 13.0–17.0)
Immature Granulocytes: 0 %
Lymphocytes Relative: 31 %
Lymphs Abs: 1.2 10*3/uL (ref 0.7–4.0)
MCH: 31.7 pg (ref 26.0–34.0)
MCHC: 36 g/dL (ref 30.0–36.0)
MCV: 87.9 fL (ref 80.0–100.0)
Monocytes Absolute: 0.5 10*3/uL (ref 0.1–1.0)
Monocytes Relative: 12 %
Neutro Abs: 2 10*3/uL (ref 1.7–7.7)
Neutrophils Relative %: 52 %
Platelet Count: 141 10*3/uL — ABNORMAL LOW (ref 150–400)
RBC: 4.23 MIL/uL (ref 4.22–5.81)
RDW: 13 % (ref 11.5–15.5)
WBC Count: 3.8 10*3/uL — ABNORMAL LOW (ref 4.0–10.5)
nRBC: 0 % (ref 0.0–0.2)

## 2020-02-28 LAB — CMP (CANCER CENTER ONLY)
ALT: 24 U/L (ref 0–44)
AST: 18 U/L (ref 15–41)
Albumin: 4.3 g/dL (ref 3.5–5.0)
Alkaline Phosphatase: 59 U/L (ref 38–126)
Anion gap: 10 (ref 5–15)
BUN: 11 mg/dL (ref 8–23)
CO2: 29 mmol/L (ref 22–32)
Calcium: 9.4 mg/dL (ref 8.9–10.3)
Chloride: 102 mmol/L (ref 98–111)
Creatinine: 1.11 mg/dL (ref 0.61–1.24)
GFR, Estimated: >60 mL/min (ref >60)
Glucose, Bld: 228 mg/dL — ABNORMAL HIGH (ref 70–99)
Potassium: 3.5 mmol/L (ref 3.5–5.1)
Sodium: 141 mmol/L (ref 135–145)
Total Bilirubin: 0.6 mg/dL (ref 0.3–1.2)
Total Protein: 6.2 g/dL — ABNORMAL LOW (ref 6.5–8.1)

## 2020-02-28 LAB — LACTATE DEHYDROGENASE: LDH: 166 U/L (ref 98–192)

## 2020-02-28 MED ORDER — HEPARIN SOD (PORK) LOCK FLUSH 100 UNIT/ML IV SOLN
500.0000 [IU] | Freq: Once | INTRAVENOUS | Status: DC
  Filled 2020-02-28: qty 5

## 2020-02-28 MED ORDER — SODIUM CHLORIDE 0.9% FLUSH
10.0000 mL | Freq: Once | INTRAVENOUS | Status: AC
  Administered 2020-02-28: 10 mL via INTRAVENOUS
  Filled 2020-02-28: qty 10

## 2020-02-28 MED ORDER — SODIUM CHLORIDE 0.9% FLUSH
10.0000 mL | Freq: Once | INTRAVENOUS | Status: DC
  Filled 2020-02-28: qty 10

## 2020-02-28 MED ORDER — HEPARIN SOD (PORK) LOCK FLUSH 100 UNIT/ML IV SOLN
500.0000 [IU] | Freq: Once | INTRAVENOUS | Status: AC
  Administered 2020-02-28: 500 [IU] via INTRAVENOUS
  Filled 2020-02-28: qty 5

## 2020-02-28 NOTE — Progress Notes (Signed)
Hematology and Oncology Follow Up Visit  Ronald Rice [id number] 1945-05-27 74 y.o. 02/28/2020   Principle Diagnosis:   Diffuse large cell non-Hodgkin's lymphoma-Richter's transformation from CLL  Current Therapy:    R-CHOP-s/p cycle #8-- started on 11/08/2018  XRT to the LEFT neck -- 4500 rad -- completed on 08/01/2019     Interim History:  Ronald Rice is back for for follow-up.  Overall, he is doing pretty well.  He comes with a rolling walker now.  He does seem to be a little bit more active.  His blood sugars still are the biggest problem.  Today, his blood sugar is 228.  I suspect that he probably ultimately will need insulin.  He really wants to avoid insulin.  He has neuropathy in his hands.  I told him that the neuropathy is not going to improve until his blood sugars get better.  I am sure he has an element of neuropathy from his chemotherapy however.  He has had no fever.  He has had no change in bowel or bladder habits.  He has had no nausea or vomiting.  His weight is up 7 pounds since we last saw him.  There has been no problems with headache.  He already has had the coronavirus booster.  Overall, his performance status is ECOG 1.   Medications:  Current Outpatient Medications:  .  acetaminophen (TYLENOL) 500 MG tablet, Take 1,000 mg by mouth every 6 (six) hours as needed (pain). , Disp: , Rfl:  .  allopurinol (ZYLOPRIM) 300 MG tablet, Take 300 mg by mouth daily., Disp: , Rfl:  .  aspirin EC 81 MG tablet, Take 81 mg by mouth daily., Disp: , Rfl:  .  atenolol (TENORMIN) 25 MG tablet, Take 25 mg by mouth daily. , Disp: , Rfl:  .  atorvastatin (LIPITOR) 40 MG tablet, Take 40 mg by mouth daily., Disp: , Rfl:  .  Cholecalciferol (VITAMIN D3 PO), Take 1 tablet by mouth daily., Disp: , Rfl:  .  CINNAMON PO, Take 1 tablet by mouth daily., Disp: , Rfl:  .  diclofenac sodium (VOLTAREN) 1 % GEL, Apply 1 application topically as needed (pain). , Disp: , Rfl:  .  diclofenac Sodium  (VOLTAREN) 1 % GEL, , Disp: , Rfl:  .  donepezil (ARICEPT ODT) 5 MG disintegrating tablet, Take 5 mg by mouth at bedtime., Disp: , Rfl:  .  DULoxetine (CYMBALTA) 60 MG capsule, TAKE 1 CAPSULE(60 MG) BY MOUTH DAILY, Disp: 90 capsule, Rfl: 3 .  famotidine (PEPCID) 20 MG tablet, Take 20 mg by mouth at bedtime as needed for heartburn. , Disp: , Rfl:  .  ipratropium (ATROVENT) 0.06 % nasal spray, Place 1 spray into both nostrils daily as needed for rhinitis. , Disp: , Rfl:  .  Lancets (ONETOUCH DELICA PLUS [id number]) MISC, , Disp: , Rfl:  .  lidocaine-prilocaine (EMLA) cream, Apply to PAC 1 hour prior to procedure., Disp: 30 g, Rfl: 3 .  lisinopril (ZESTRIL) 40 MG tablet, Take 1 tablet (40 mg total) by mouth daily for 30 days., Disp: 30 tablet, Rfl: 0 .  metFORMIN (GLUCOPHAGE-XR) 500 MG 24 hr tablet, Take 500 mg by mouth every evening. , Disp: , Rfl:  .  NON FORMULARY, Take 2 tablets by mouth daily. Blood Sugar Defense , Disp: , Rfl:  .  potassium chloride (K-DUR) 10 MEQ tablet, Take 10 mEq by mouth daily. , Disp: , Rfl:  .  pyridoxine (B-6) 250 MG tablet, Take 250 mg  by mouth daily., Disp: , Rfl:  .  tamsulosin (FLOMAX) 0.4 MG CAPS capsule, Take 0.4 mg by mouth daily., Disp: , Rfl:  .  traMADol (ULTRAM) 50 MG tablet, TAKE 1 TABLET BY MOUTH EVERY 6 HOURS AS NEEDED (Patient taking differently: Take 50 mg by mouth every 6 (six) hours as needed for moderate pain. ), Disp: 90 tablet, Rfl: 0  Allergies:  No Known Allergies  Past Medical History, Surgical history, Social history, and Family History were reviewed and updated.  Review of Systems: Review of Systems  Constitutional: Negative.   HENT:  Negative.   Eyes: Negative.   Respiratory: Negative.   Cardiovascular: Negative.   Gastrointestinal: Negative.   Endocrine: Negative.   Genitourinary: Positive for nocturia.   Musculoskeletal: Positive for arthralgias, gait problem and myalgias.  Neurological: Positive for gait problem.  Hematological:  Negative.   Psychiatric/Behavioral: Negative.     Physical Exam:  weight is 284 lb (128.8 kg). His oral temperature is 98 F (36.7 C). His blood pressure is 159/85 (abnormal) and his pulse is 85. His respiration is 17 and oxygen saturation is 97%.   Wt Readings from Last 3 Encounters:  02/28/20 284 lb (128.8 kg)  01/14/20 286 lb (129.7 kg)  11/28/19 280 lb (127 kg)    Physical Exam Vitals reviewed.  HENT:     Head: Normocephalic and atraumatic.  Eyes:     Pupils: Pupils are equal, round, and reactive to light.  Neck:     Comments: Examination of his neck shows marked improvement in the adenopathy in the left neck.  He still has some palpable posterior cervical lymphadenopathy.  It probably measures about 2 x 2 cm.  It is not as firm.  It is somewhat mobile.. Cardiovascular:     Rate and Rhythm: Normal rate and regular rhythm.     Heart sounds: Normal heart sounds.  Pulmonary:     Effort: Pulmonary effort is normal.     Breath sounds: Normal breath sounds.  Abdominal:     General: Bowel sounds are normal.     Palpations: Abdomen is soft.  Musculoskeletal:        General: No tenderness or deformity. Normal range of motion.     Comments: He has a cast on the right foot and lower leg.  This goes about two thirds the way up his right leg.  Left leg is unremarkable.  He has no swelling.  He has decent pulses.  He has good range of motion of his joints.  Lymphadenopathy:     Cervical: No cervical adenopathy.  Skin:    General: Skin is warm and dry.     Findings: No erythema or rash.  Neurological:     Mental Status: He is alert and oriented to person, place, and time.  Psychiatric:        Behavior: Behavior normal.        Thought Content: Thought content normal.        Judgment: Judgment normal.      Lab Results  Component Value Date   WBC 3.8 (L) 02/28/2020   HGB 13.4 02/28/2020   HCT 37.2 (L) 02/28/2020   MCV 87.9 02/28/2020   PLT 141 (L) 02/28/2020     Chemistry       Component Value Date/Time   NA 141 02/28/2020 1208   NA 142 01/25/2017 1217   K 3.5 02/28/2020 1208   K 4.1 01/25/2017 1217   CL 102 02/28/2020 1208   CL  102 01/25/2017 1217   CO2 29 02/28/2020 1208   CO2 33 01/25/2017 1217   BUN 11 02/28/2020 1208   BUN 12 01/25/2017 1217   CREATININE 1.11 02/28/2020 1208   CREATININE 1.4 (H) 01/25/2017 1217      Component Value Date/Time   CALCIUM 9.4 02/28/2020 1208   CALCIUM 9.7 01/25/2017 1217   ALKPHOS 59 02/28/2020 1208   ALKPHOS 59 01/25/2017 1217   AST 18 02/28/2020 1208   ALT 24 02/28/2020 1208   ALT 32 01/25/2017 1217   BILITOT 0.6 02/28/2020 1208      Impression and Plan: Mr. Renstrom is a 74 year old African-American male.  He was diagnosed with CLL back in [place].  He then had transformation to a large cell non-Hodgkin's lymphoma.  He was treated with chemotherapy followed by radiation therapy to the neck given that he had bulky disease.   I told him that if he wants to go to [place] next month, he can.  I do not see a reason why I cannot go down to [place].  He has family around the [place] area.  Hopefully, his blood sugars will get under better control.  This will definitely be his long-term issue.  I suppose his lymphoma could always come back.  I know he was treated aggressively.    He still has a Port-A-Cath in.  We will flush his every couple months.  I will plan to see him back in 4 months.  Volanda Napoleon, MD 10/7/20211:04 PM

## 2020-02-28 NOTE — Patient Instructions (Signed)

## 2020-02-29 DIAGNOSIS — G4733 Obstructive sleep apnea (adult) (pediatric): Secondary | ICD-10-CM | POA: Diagnosis not present

## 2020-02-29 DIAGNOSIS — G4737 Central sleep apnea in conditions classified elsewhere: Secondary | ICD-10-CM | POA: Diagnosis not present

## 2020-03-10 ENCOUNTER — Other Ambulatory Visit: Payer: Self-pay | Admitting: Internal Medicine

## 2020-03-10 DIAGNOSIS — Z8579 Personal history of other malignant neoplasms of lymphoid, hematopoietic and related tissues: Secondary | ICD-10-CM | POA: Diagnosis not present

## 2020-03-10 DIAGNOSIS — L749 Eccrine sweat disorder, unspecified: Secondary | ICD-10-CM

## 2020-03-10 DIAGNOSIS — I10 Essential (primary) hypertension: Secondary | ICD-10-CM | POA: Diagnosis not present

## 2020-03-12 DIAGNOSIS — M25551 Pain in right hip: Secondary | ICD-10-CM | POA: Diagnosis not present

## 2020-03-12 DIAGNOSIS — M25572 Pain in left ankle and joints of left foot: Secondary | ICD-10-CM | POA: Diagnosis not present

## 2020-03-12 DIAGNOSIS — M25511 Pain in right shoulder: Secondary | ICD-10-CM | POA: Diagnosis not present

## 2020-03-17 ENCOUNTER — Telehealth: Payer: Self-pay | Admitting: *Deleted

## 2020-03-17 NOTE — Telephone Encounter (Signed)
Message received from patient's wife wanting to know if it is ok with Dr. Marin Olp that patient have a CT scan of the chest/abdomen and pelvis ordered by Dr. Shelia Media.  Call placed back to patient's wife and patient's wife notified that it is ok for pt to have CT C/A/P per Dr. Marin Olp.  Pt.'s wife appreciative of call back and has no further questions at this time.

## 2020-03-25 ENCOUNTER — Inpatient Hospital Stay: Admission: RE | Admit: 2020-03-25 | Payer: Medicare HMO | Source: Ambulatory Visit

## 2020-03-25 ENCOUNTER — Other Ambulatory Visit: Payer: Medicare HMO

## 2020-03-26 ENCOUNTER — Other Ambulatory Visit: Payer: Self-pay

## 2020-03-26 ENCOUNTER — Ambulatory Visit (AMBULATORY_SURGERY_CENTER): Payer: Self-pay | Admitting: *Deleted

## 2020-03-26 VITALS — Ht 74.0 in | Wt 281.0 lb

## 2020-03-26 DIAGNOSIS — Z1211 Encounter for screening for malignant neoplasm of colon: Secondary | ICD-10-CM

## 2020-03-26 MED ORDER — NA SULFATE-K SULFATE-MG SULF 17.5-3.13-1.6 GM/177ML PO SOLN: ORAL | 0 refills | Status: DC

## 2020-03-26 NOTE — Progress Notes (Signed)
Patient and wife is here in-person for PV. Patient denies any allergies to eggs or soy. Patient denies any problems with anesthesia/sedation. Patient denies any oxygen use at home. Patient denies taking any diet/weight loss medications or blood thinners. Patient is not being treated for MRSA or C-diff. Patient is aware of our care-partner policy and [id number] safety protocol.    COVID-19 vaccines completed on 01/19/2020 booster, per patient.

## 2020-03-28 ENCOUNTER — Encounter: Payer: Self-pay | Admitting: Gastroenterology

## 2020-03-31 DIAGNOSIS — G4737 Central sleep apnea in conditions classified elsewhere: Secondary | ICD-10-CM | POA: Diagnosis not present

## 2020-03-31 DIAGNOSIS — G4733 Obstructive sleep apnea (adult) (pediatric): Secondary | ICD-10-CM | POA: Diagnosis not present

## 2020-04-07 DIAGNOSIS — G4733 Obstructive sleep apnea (adult) (pediatric): Secondary | ICD-10-CM | POA: Diagnosis not present

## 2020-04-07 DIAGNOSIS — Z6838 Body mass index (BMI) 38.0-38.9, adult: Secondary | ICD-10-CM | POA: Diagnosis not present

## 2020-04-07 DIAGNOSIS — Z7982 Long term (current) use of aspirin: Secondary | ICD-10-CM | POA: Diagnosis not present

## 2020-04-07 DIAGNOSIS — M109 Gout, unspecified: Secondary | ICD-10-CM | POA: Diagnosis not present

## 2020-04-07 DIAGNOSIS — L749 Eccrine sweat disorder, unspecified: Secondary | ICD-10-CM | POA: Diagnosis not present

## 2020-04-07 DIAGNOSIS — I10 Essential (primary) hypertension: Secondary | ICD-10-CM | POA: Diagnosis not present

## 2020-04-07 DIAGNOSIS — E119 Type 2 diabetes mellitus without complications: Secondary | ICD-10-CM | POA: Diagnosis not present

## 2020-04-07 DIAGNOSIS — E785 Hyperlipidemia, unspecified: Secondary | ICD-10-CM | POA: Diagnosis not present

## 2020-04-08 ENCOUNTER — Telehealth: Payer: Self-pay | Admitting: *Deleted

## 2020-04-08 ENCOUNTER — Telehealth: Payer: Self-pay | Admitting: Gastroenterology

## 2020-04-08 NOTE — Telephone Encounter (Signed)
Message received from patient's wife requesting a call back.  Call placed back to patient's wife and patient's wife is requesting a letter from Dr. Marin Olp that will assist him in getting VA benefits.  Informed pt.'s wife that Dr. Marin Olp is out of the office today and that I would speak with him tomorrow regarding her request.  Pt.'s wife is appreciative of assistance and has no further questions at this time.

## 2020-04-08 NOTE — Telephone Encounter (Signed)
Noted. Thanks.

## 2020-04-08 NOTE — Telephone Encounter (Signed)
Patient wife called to cancel the procedure scheduled for 04/09/2020 states the patients BP is high and is needing to follow up with dr. Deland Pretty instead

## 2020-04-09 ENCOUNTER — Encounter: Payer: Self-pay | Admitting: *Deleted

## 2020-04-09 ENCOUNTER — Encounter: Payer: Medicare HMO | Admitting: Gastroenterology

## 2020-04-09 DIAGNOSIS — E1140 Type 2 diabetes mellitus with diabetic neuropathy, unspecified: Secondary | ICD-10-CM | POA: Diagnosis not present

## 2020-04-09 DIAGNOSIS — L749 Eccrine sweat disorder, unspecified: Secondary | ICD-10-CM | POA: Diagnosis not present

## 2020-04-09 DIAGNOSIS — I10 Essential (primary) hypertension: Secondary | ICD-10-CM | POA: Diagnosis not present

## 2020-04-10 ENCOUNTER — Other Ambulatory Visit: Payer: Medicare HMO

## 2020-04-10 ENCOUNTER — Telehealth: Payer: Self-pay | Admitting: *Deleted

## 2020-04-10 NOTE — Telephone Encounter (Signed)
Late entry: Called and spoke with pt and wife regarding requested letter. Discussed VA disability process with pt and wife. Pt will need to call or visit the [facility] disability office in [place] to start the process. A counselor will be assigned to the case and assist pt through the process. The VA will have access to all pt records and will not need a ,letter to support pts claims or diagnosis.  Pt and wife verbalized understanding, Phone #'s and addresses to  [facility] and [place] office given to pt along with web site to help navigate process. No further concerns.

## 2020-04-22 DIAGNOSIS — E785 Hyperlipidemia, unspecified: Secondary | ICD-10-CM | POA: Diagnosis not present

## 2020-04-22 DIAGNOSIS — I10 Essential (primary) hypertension: Secondary | ICD-10-CM | POA: Diagnosis not present

## 2020-04-22 DIAGNOSIS — E119 Type 2 diabetes mellitus without complications: Secondary | ICD-10-CM | POA: Diagnosis not present

## 2020-04-22 DIAGNOSIS — Z7982 Long term (current) use of aspirin: Secondary | ICD-10-CM | POA: Diagnosis not present

## 2020-04-22 DIAGNOSIS — M109 Gout, unspecified: Secondary | ICD-10-CM | POA: Diagnosis not present

## 2020-04-22 DIAGNOSIS — G4733 Obstructive sleep apnea (adult) (pediatric): Secondary | ICD-10-CM | POA: Diagnosis not present

## 2020-04-22 DIAGNOSIS — Z6838 Body mass index (BMI) 38.0-38.9, adult: Secondary | ICD-10-CM | POA: Diagnosis not present

## 2020-04-22 DIAGNOSIS — L749 Eccrine sweat disorder, unspecified: Secondary | ICD-10-CM | POA: Diagnosis not present

## 2020-04-24 ENCOUNTER — Other Ambulatory Visit: Payer: Medicare HMO

## 2020-04-25 DIAGNOSIS — G4733 Obstructive sleep apnea (adult) (pediatric): Secondary | ICD-10-CM | POA: Diagnosis not present

## 2020-04-25 DIAGNOSIS — L749 Eccrine sweat disorder, unspecified: Secondary | ICD-10-CM | POA: Diagnosis not present

## 2020-04-25 DIAGNOSIS — E119 Type 2 diabetes mellitus without complications: Secondary | ICD-10-CM | POA: Diagnosis not present

## 2020-04-25 DIAGNOSIS — Z6838 Body mass index (BMI) 38.0-38.9, adult: Secondary | ICD-10-CM | POA: Diagnosis not present

## 2020-04-25 DIAGNOSIS — Z7982 Long term (current) use of aspirin: Secondary | ICD-10-CM | POA: Diagnosis not present

## 2020-04-25 DIAGNOSIS — M109 Gout, unspecified: Secondary | ICD-10-CM | POA: Diagnosis not present

## 2020-04-25 DIAGNOSIS — I10 Essential (primary) hypertension: Secondary | ICD-10-CM | POA: Diagnosis not present

## 2020-04-25 DIAGNOSIS — E785 Hyperlipidemia, unspecified: Secondary | ICD-10-CM | POA: Diagnosis not present

## 2020-04-28 DIAGNOSIS — I10 Essential (primary) hypertension: Secondary | ICD-10-CM | POA: Diagnosis not present

## 2020-04-28 DIAGNOSIS — E785 Hyperlipidemia, unspecified: Secondary | ICD-10-CM | POA: Diagnosis not present

## 2020-04-28 DIAGNOSIS — M109 Gout, unspecified: Secondary | ICD-10-CM | POA: Diagnosis not present

## 2020-04-28 DIAGNOSIS — Z7982 Long term (current) use of aspirin: Secondary | ICD-10-CM | POA: Diagnosis not present

## 2020-04-28 DIAGNOSIS — L749 Eccrine sweat disorder, unspecified: Secondary | ICD-10-CM | POA: Diagnosis not present

## 2020-04-28 DIAGNOSIS — E119 Type 2 diabetes mellitus without complications: Secondary | ICD-10-CM | POA: Diagnosis not present

## 2020-04-28 DIAGNOSIS — G4733 Obstructive sleep apnea (adult) (pediatric): Secondary | ICD-10-CM | POA: Diagnosis not present

## 2020-04-28 DIAGNOSIS — Z6838 Body mass index (BMI) 38.0-38.9, adult: Secondary | ICD-10-CM | POA: Diagnosis not present

## 2020-04-29 ENCOUNTER — Other Ambulatory Visit: Payer: Self-pay

## 2020-04-29 ENCOUNTER — Inpatient Hospital Stay: Payer: Medicare HMO | Attending: Hematology & Oncology

## 2020-04-29 DIAGNOSIS — Z95828 Presence of other vascular implants and grafts: Secondary | ICD-10-CM

## 2020-04-29 DIAGNOSIS — E119 Type 2 diabetes mellitus without complications: Secondary | ICD-10-CM | POA: Diagnosis not present

## 2020-04-29 DIAGNOSIS — Z452 Encounter for adjustment and management of vascular access device: Secondary | ICD-10-CM | POA: Insufficient documentation

## 2020-04-29 DIAGNOSIS — I10 Essential (primary) hypertension: Secondary | ICD-10-CM | POA: Diagnosis not present

## 2020-04-29 DIAGNOSIS — L749 Eccrine sweat disorder, unspecified: Secondary | ICD-10-CM | POA: Diagnosis not present

## 2020-04-29 DIAGNOSIS — C9110 Chronic lymphocytic leukemia of B-cell type not having achieved remission: Secondary | ICD-10-CM | POA: Insufficient documentation

## 2020-04-29 DIAGNOSIS — M109 Gout, unspecified: Secondary | ICD-10-CM | POA: Diagnosis not present

## 2020-04-29 MED ORDER — SODIUM CHLORIDE 0.9% FLUSH
10.0000 mL | Freq: Once | INTRAVENOUS | Status: AC
  Administered 2020-04-29: 10 mL via INTRAVENOUS
  Filled 2020-04-29: qty 10

## 2020-04-29 MED ORDER — HEPARIN SOD (PORK) LOCK FLUSH 100 UNIT/ML IV SOLN
500.0000 [IU] | Freq: Once | INTRAVENOUS | Status: AC
  Administered 2020-04-29: 500 [IU] via INTRAVENOUS
  Filled 2020-04-29: qty 5

## 2020-04-29 NOTE — Patient Instructions (Signed)
Tunneled Central Venous Catheter Flushing Guide  It is important to flush your tunneled central venous catheter each time you use it, both before and after you use it. Flushing your catheter will help prevent it from clogging. What are the risks? Risks may include:  Infection.  Air getting into the catheter and bloodstream. Supplies needed:  A clean pair of gloves.  A disinfecting wipe. Use an alcohol wipe, chlorhexidine wipe, or iodine wipe as told by your health care provider.  A 10 mL syringe that has been prefilled with saline solution.  An empty 10 mL syringe, if a substance called heparin was injected into your catheter. How to flush your catheter When you flush your catheter, make sure you follow any specific instructions from your health care provider or the manufacturer. These are general guidelines. Flushing your catheter before use If there is heparin in your catheter: 1. Wash your hands with soap and water. 2. Put on gloves. 3. Scrub the injection cap for a minimum of 15 seconds with a disinfecting wipe. 4. Unclamp the catheter. 5. Attach the empty syringe to the injection cap. 6. Pull the syringe plunger back and withdraw 10 mL of blood. 7. Place the syringe into an appropriate waste container. 8. Scrub the injection cap for 15 seconds with a disinfecting wipe. 9. Attach the prefilled syringe to the injection cap. 10. Flush the catheter by pushing the plunger forward until all the liquid from the syringe is in the catheter. 11. Remove the syringe from the injection cap. 12. Clamp the catheter. If there is no heparin in your catheter: 1. Wash your hands with soap and water. 2. Put on gloves. 3. Scrub the injection cap for 15 seconds with a disinfecting wipe. 4. Unclamp the catheter. 5. Attach the prefilled syringe to the injection cap. 6. Flush the catheter by pushing the plunger forward until 5 mL of the liquid from the syringe is in the catheter. 7. Pull back on  the syringe until you see blood in the catheter. 8. If you have been asked to collect any blood, follow your health care provider's instructions. Otherwise, flush the catheter with the rest of the solution from the syringe. 9. Remove the syringe from the injection cap. 10. Clamp the catheter.  Flushing your catheter after use 1. Wash your hands with soap and water. 2. Put on gloves. 3. Scrub the injection cap for 15 seconds with a disinfecting wipe. 4. Unclamp the catheter. 5. Attach the prefilled syringe to the injection cap. 6. Flush the catheter by pushing the plunger forward until all of the liquid from the syringe is in the catheter. 7. Remove the syringe from the injection cap. 8. Clamp the catheter. Problems and solutions  If blood cannot be completely cleared from the injection cap, you may need to have the injection cap replaced.  If the catheter is difficult to flush, use the pulsing method. The pulsing method involves pushing only a few milliliters of solution into the catheter at a time and pausing between pushes.  If you do not see blood in the catheter when you pull back on the syringe, change your body position, such as by raising your arms above your head. Take a deep breath and cough. Then, pull back on the syringe. If you still do not see blood, flush the catheter with a small amount of solution. Then, change positions again and take a breath or cough. Pull back on the syringe again. If you still do not see   blood, finish flushing the catheter and contact your health care provider. Do not use your catheter until your health care provider says it is okay. General tips  Have someone help you flush your catheter, if possible.  Do not force fluid through your catheter.  Do not use a syringe that is larger or smaller than 10 mL. Using a smaller syringe can make the catheter burst.  Do not use your catheter without flushing it first if it has heparin in it. Contact a health  care provider if:  You cannot see any blood in the catheter when you flush it before using it.  Your catheter is difficult to flush. Get help right away if:  You cannot flush the catheter.  The catheter leaks when you flush it or when there is fluid in it.  There are cracks or breaks in the catheter. Summary  It is important to flush your tunneled central venous catheter each time you use it, both before and after you use it.  Scrub the injection cap for 15 seconds with a disinfecting wipe before and after you flush it.  When you flush your catheter, make sure you follow any specific instructions from your health care provider or the manufacturer.  Get help right away if you cannot flush the catheter. This information is not intended to replace advice given to you by your health care provider. Make sure you discuss any questions you have with your health care provider. Document Revised: 02/02/2019 Document Reviewed: 07/26/2018 Elsevier Patient Education  2020 [employer]  

## 2020-04-29 NOTE — Progress Notes (Signed)
Pt discharged in no apparent distress. Pt left ambulatory with rolling walker. Pt aware of discharge instructions and verbalized understanding and had no further questions.

## 2020-04-30 DIAGNOSIS — G4737 Central sleep apnea in conditions classified elsewhere: Secondary | ICD-10-CM | POA: Diagnosis not present

## 2020-04-30 DIAGNOSIS — G4733 Obstructive sleep apnea (adult) (pediatric): Secondary | ICD-10-CM | POA: Diagnosis not present

## 2020-05-01 DIAGNOSIS — E785 Hyperlipidemia, unspecified: Secondary | ICD-10-CM | POA: Diagnosis not present

## 2020-05-01 DIAGNOSIS — M109 Gout, unspecified: Secondary | ICD-10-CM | POA: Diagnosis not present

## 2020-05-01 DIAGNOSIS — I10 Essential (primary) hypertension: Secondary | ICD-10-CM | POA: Diagnosis not present

## 2020-05-01 DIAGNOSIS — Z6838 Body mass index (BMI) 38.0-38.9, adult: Secondary | ICD-10-CM | POA: Diagnosis not present

## 2020-05-01 DIAGNOSIS — Z7982 Long term (current) use of aspirin: Secondary | ICD-10-CM | POA: Diagnosis not present

## 2020-05-01 DIAGNOSIS — L749 Eccrine sweat disorder, unspecified: Secondary | ICD-10-CM | POA: Diagnosis not present

## 2020-05-01 DIAGNOSIS — E119 Type 2 diabetes mellitus without complications: Secondary | ICD-10-CM | POA: Diagnosis not present

## 2020-05-01 DIAGNOSIS — G4733 Obstructive sleep apnea (adult) (pediatric): Secondary | ICD-10-CM | POA: Diagnosis not present

## 2020-05-07 DIAGNOSIS — E119 Type 2 diabetes mellitus without complications: Secondary | ICD-10-CM | POA: Diagnosis not present

## 2020-05-07 DIAGNOSIS — I10 Essential (primary) hypertension: Secondary | ICD-10-CM | POA: Diagnosis not present

## 2020-05-07 DIAGNOSIS — E785 Hyperlipidemia, unspecified: Secondary | ICD-10-CM | POA: Diagnosis not present

## 2020-05-07 DIAGNOSIS — L749 Eccrine sweat disorder, unspecified: Secondary | ICD-10-CM | POA: Diagnosis not present

## 2020-05-07 DIAGNOSIS — Z6838 Body mass index (BMI) 38.0-38.9, adult: Secondary | ICD-10-CM | POA: Diagnosis not present

## 2020-05-07 DIAGNOSIS — G4733 Obstructive sleep apnea (adult) (pediatric): Secondary | ICD-10-CM | POA: Diagnosis not present

## 2020-05-07 DIAGNOSIS — M109 Gout, unspecified: Secondary | ICD-10-CM | POA: Diagnosis not present

## 2020-05-07 DIAGNOSIS — Z7982 Long term (current) use of aspirin: Secondary | ICD-10-CM | POA: Diagnosis not present

## 2020-05-12 DIAGNOSIS — E785 Hyperlipidemia, unspecified: Secondary | ICD-10-CM | POA: Diagnosis not present

## 2020-05-12 DIAGNOSIS — M109 Gout, unspecified: Secondary | ICD-10-CM | POA: Diagnosis not present

## 2020-05-12 DIAGNOSIS — E119 Type 2 diabetes mellitus without complications: Secondary | ICD-10-CM | POA: Diagnosis not present

## 2020-05-12 DIAGNOSIS — L749 Eccrine sweat disorder, unspecified: Secondary | ICD-10-CM | POA: Diagnosis not present

## 2020-05-12 DIAGNOSIS — G4733 Obstructive sleep apnea (adult) (pediatric): Secondary | ICD-10-CM | POA: Diagnosis not present

## 2020-05-12 DIAGNOSIS — Z7982 Long term (current) use of aspirin: Secondary | ICD-10-CM | POA: Diagnosis not present

## 2020-05-12 DIAGNOSIS — Z6838 Body mass index (BMI) 38.0-38.9, adult: Secondary | ICD-10-CM | POA: Diagnosis not present

## 2020-05-12 DIAGNOSIS — I10 Essential (primary) hypertension: Secondary | ICD-10-CM | POA: Diagnosis not present

## 2020-05-13 ENCOUNTER — Other Ambulatory Visit: Payer: Medicare HMO

## 2020-05-16 DIAGNOSIS — Z20828 Contact with and (suspected) exposure to other viral communicable diseases: Secondary | ICD-10-CM | POA: Diagnosis not present

## 2020-05-16 DIAGNOSIS — R0600 Dyspnea, unspecified: Secondary | ICD-10-CM | POA: Diagnosis not present

## 2020-05-16 DIAGNOSIS — U071 COVID-19: Secondary | ICD-10-CM | POA: Diagnosis not present

## 2020-05-19 ENCOUNTER — Telehealth: Payer: Self-pay | Admitting: *Deleted

## 2020-05-19 DIAGNOSIS — G4733 Obstructive sleep apnea (adult) (pediatric): Secondary | ICD-10-CM | POA: Diagnosis not present

## 2020-05-19 NOTE — Telephone Encounter (Signed)
Message received from patient's wife, [name] stating that they are in [place], have Covid-19 and would like to know if it is ok with Dr. Myna Hidalgo if pt takes Zinc.  Call placed back to [name] and [name] informed per Dr. Myna Hidalgo that it is ok for pt to take Zinc.  [name] is appreciative of call back and has no further questions at this time.

## 2020-05-31 DIAGNOSIS — G4733 Obstructive sleep apnea (adult) (pediatric): Secondary | ICD-10-CM | POA: Diagnosis not present

## 2020-05-31 DIAGNOSIS — G4737 Central sleep apnea in conditions classified elsewhere: Secondary | ICD-10-CM | POA: Diagnosis not present

## 2020-06-05 DIAGNOSIS — L749 Eccrine sweat disorder, unspecified: Secondary | ICD-10-CM | POA: Diagnosis not present

## 2020-06-05 DIAGNOSIS — M109 Gout, unspecified: Secondary | ICD-10-CM | POA: Diagnosis not present

## 2020-06-05 DIAGNOSIS — E785 Hyperlipidemia, unspecified: Secondary | ICD-10-CM | POA: Diagnosis not present

## 2020-06-05 DIAGNOSIS — I10 Essential (primary) hypertension: Secondary | ICD-10-CM | POA: Diagnosis not present

## 2020-06-05 DIAGNOSIS — E119 Type 2 diabetes mellitus without complications: Secondary | ICD-10-CM | POA: Diagnosis not present

## 2020-06-05 DIAGNOSIS — Z6838 Body mass index (BMI) 38.0-38.9, adult: Secondary | ICD-10-CM | POA: Diagnosis not present

## 2020-06-05 DIAGNOSIS — G4733 Obstructive sleep apnea (adult) (pediatric): Secondary | ICD-10-CM | POA: Diagnosis not present

## 2020-06-05 DIAGNOSIS — Z7982 Long term (current) use of aspirin: Secondary | ICD-10-CM | POA: Diagnosis not present

## 2020-06-06 DIAGNOSIS — G4733 Obstructive sleep apnea (adult) (pediatric): Secondary | ICD-10-CM | POA: Diagnosis not present

## 2020-06-06 DIAGNOSIS — L749 Eccrine sweat disorder, unspecified: Secondary | ICD-10-CM | POA: Diagnosis not present

## 2020-06-06 DIAGNOSIS — I10 Essential (primary) hypertension: Secondary | ICD-10-CM | POA: Diagnosis not present

## 2020-06-06 DIAGNOSIS — G3281 Cerebellar ataxia in diseases classified elsewhere: Secondary | ICD-10-CM | POA: Diagnosis not present

## 2020-06-06 DIAGNOSIS — Z6838 Body mass index (BMI) 38.0-38.9, adult: Secondary | ICD-10-CM | POA: Diagnosis not present

## 2020-06-06 DIAGNOSIS — E1142 Type 2 diabetes mellitus with diabetic polyneuropathy: Secondary | ICD-10-CM | POA: Diagnosis not present

## 2020-06-06 DIAGNOSIS — M109 Gout, unspecified: Secondary | ICD-10-CM | POA: Diagnosis not present

## 2020-06-06 DIAGNOSIS — E785 Hyperlipidemia, unspecified: Secondary | ICD-10-CM | POA: Diagnosis not present

## 2020-06-09 DIAGNOSIS — G4733 Obstructive sleep apnea (adult) (pediatric): Secondary | ICD-10-CM | POA: Diagnosis not present

## 2020-06-09 DIAGNOSIS — L749 Eccrine sweat disorder, unspecified: Secondary | ICD-10-CM | POA: Diagnosis not present

## 2020-06-09 DIAGNOSIS — M109 Gout, unspecified: Secondary | ICD-10-CM | POA: Diagnosis not present

## 2020-06-09 DIAGNOSIS — I10 Essential (primary) hypertension: Secondary | ICD-10-CM | POA: Diagnosis not present

## 2020-06-09 DIAGNOSIS — E785 Hyperlipidemia, unspecified: Secondary | ICD-10-CM | POA: Diagnosis not present

## 2020-06-09 DIAGNOSIS — Z6838 Body mass index (BMI) 38.0-38.9, adult: Secondary | ICD-10-CM | POA: Diagnosis not present

## 2020-06-09 DIAGNOSIS — E1142 Type 2 diabetes mellitus with diabetic polyneuropathy: Secondary | ICD-10-CM | POA: Diagnosis not present

## 2020-06-09 DIAGNOSIS — G3281 Cerebellar ataxia in diseases classified elsewhere: Secondary | ICD-10-CM | POA: Diagnosis not present

## 2020-06-17 DIAGNOSIS — G4733 Obstructive sleep apnea (adult) (pediatric): Secondary | ICD-10-CM | POA: Diagnosis not present

## 2020-06-17 DIAGNOSIS — E1142 Type 2 diabetes mellitus with diabetic polyneuropathy: Secondary | ICD-10-CM | POA: Diagnosis not present

## 2020-06-17 DIAGNOSIS — Z6838 Body mass index (BMI) 38.0-38.9, adult: Secondary | ICD-10-CM | POA: Diagnosis not present

## 2020-06-17 DIAGNOSIS — I10 Essential (primary) hypertension: Secondary | ICD-10-CM | POA: Diagnosis not present

## 2020-06-17 DIAGNOSIS — L749 Eccrine sweat disorder, unspecified: Secondary | ICD-10-CM | POA: Diagnosis not present

## 2020-06-17 DIAGNOSIS — M109 Gout, unspecified: Secondary | ICD-10-CM | POA: Diagnosis not present

## 2020-06-17 DIAGNOSIS — G3281 Cerebellar ataxia in diseases classified elsewhere: Secondary | ICD-10-CM | POA: Diagnosis not present

## 2020-06-17 DIAGNOSIS — E785 Hyperlipidemia, unspecified: Secondary | ICD-10-CM | POA: Diagnosis not present

## 2020-06-26 DIAGNOSIS — Z6838 Body mass index (BMI) 38.0-38.9, adult: Secondary | ICD-10-CM | POA: Diagnosis not present

## 2020-06-26 DIAGNOSIS — E1142 Type 2 diabetes mellitus with diabetic polyneuropathy: Secondary | ICD-10-CM | POA: Diagnosis not present

## 2020-06-26 DIAGNOSIS — G3281 Cerebellar ataxia in diseases classified elsewhere: Secondary | ICD-10-CM | POA: Diagnosis not present

## 2020-06-26 DIAGNOSIS — I10 Essential (primary) hypertension: Secondary | ICD-10-CM | POA: Diagnosis not present

## 2020-06-26 DIAGNOSIS — E785 Hyperlipidemia, unspecified: Secondary | ICD-10-CM | POA: Diagnosis not present

## 2020-06-26 DIAGNOSIS — M109 Gout, unspecified: Secondary | ICD-10-CM | POA: Diagnosis not present

## 2020-06-26 DIAGNOSIS — L749 Eccrine sweat disorder, unspecified: Secondary | ICD-10-CM | POA: Diagnosis not present

## 2020-06-26 DIAGNOSIS — G4733 Obstructive sleep apnea (adult) (pediatric): Secondary | ICD-10-CM | POA: Diagnosis not present

## 2020-06-27 DIAGNOSIS — N390 Urinary tract infection, site not specified: Secondary | ICD-10-CM | POA: Diagnosis not present

## 2020-06-27 DIAGNOSIS — N401 Enlarged prostate with lower urinary tract symptoms: Secondary | ICD-10-CM | POA: Diagnosis not present

## 2020-06-27 DIAGNOSIS — N138 Other obstructive and reflux uropathy: Secondary | ICD-10-CM | POA: Diagnosis not present

## 2020-06-30 ENCOUNTER — Inpatient Hospital Stay: Payer: Medicare HMO | Admitting: Hematology & Oncology

## 2020-06-30 ENCOUNTER — Telehealth: Payer: Self-pay

## 2020-06-30 ENCOUNTER — Inpatient Hospital Stay: Payer: Medicare HMO

## 2020-06-30 NOTE — Telephone Encounter (Signed)
Diane called to r/s todays appt to march due to weather.  Pt was offered to see sarah but declined      Sidney Silberman

## 2020-07-01 DIAGNOSIS — Z6838 Body mass index (BMI) 38.0-38.9, adult: Secondary | ICD-10-CM | POA: Diagnosis not present

## 2020-07-01 DIAGNOSIS — E785 Hyperlipidemia, unspecified: Secondary | ICD-10-CM | POA: Diagnosis not present

## 2020-07-01 DIAGNOSIS — M109 Gout, unspecified: Secondary | ICD-10-CM | POA: Diagnosis not present

## 2020-07-01 DIAGNOSIS — I10 Essential (primary) hypertension: Secondary | ICD-10-CM | POA: Diagnosis not present

## 2020-07-01 DIAGNOSIS — G3281 Cerebellar ataxia in diseases classified elsewhere: Secondary | ICD-10-CM | POA: Diagnosis not present

## 2020-07-01 DIAGNOSIS — E1142 Type 2 diabetes mellitus with diabetic polyneuropathy: Secondary | ICD-10-CM | POA: Diagnosis not present

## 2020-07-01 DIAGNOSIS — L749 Eccrine sweat disorder, unspecified: Secondary | ICD-10-CM | POA: Diagnosis not present

## 2020-07-01 DIAGNOSIS — G4733 Obstructive sleep apnea (adult) (pediatric): Secondary | ICD-10-CM | POA: Diagnosis not present

## 2020-07-01 DIAGNOSIS — G4737 Central sleep apnea in conditions classified elsewhere: Secondary | ICD-10-CM | POA: Diagnosis not present

## 2020-07-06 DIAGNOSIS — L749 Eccrine sweat disorder, unspecified: Secondary | ICD-10-CM | POA: Diagnosis not present

## 2020-07-06 DIAGNOSIS — M109 Gout, unspecified: Secondary | ICD-10-CM | POA: Diagnosis not present

## 2020-07-06 DIAGNOSIS — E1142 Type 2 diabetes mellitus with diabetic polyneuropathy: Secondary | ICD-10-CM | POA: Diagnosis not present

## 2020-07-06 DIAGNOSIS — G3281 Cerebellar ataxia in diseases classified elsewhere: Secondary | ICD-10-CM | POA: Diagnosis not present

## 2020-07-06 DIAGNOSIS — Z6838 Body mass index (BMI) 38.0-38.9, adult: Secondary | ICD-10-CM | POA: Diagnosis not present

## 2020-07-06 DIAGNOSIS — I10 Essential (primary) hypertension: Secondary | ICD-10-CM | POA: Diagnosis not present

## 2020-07-06 DIAGNOSIS — G4733 Obstructive sleep apnea (adult) (pediatric): Secondary | ICD-10-CM | POA: Diagnosis not present

## 2020-07-06 DIAGNOSIS — E785 Hyperlipidemia, unspecified: Secondary | ICD-10-CM | POA: Diagnosis not present

## 2020-07-08 DIAGNOSIS — L749 Eccrine sweat disorder, unspecified: Secondary | ICD-10-CM | POA: Diagnosis not present

## 2020-07-08 DIAGNOSIS — Z6838 Body mass index (BMI) 38.0-38.9, adult: Secondary | ICD-10-CM | POA: Diagnosis not present

## 2020-07-08 DIAGNOSIS — G4733 Obstructive sleep apnea (adult) (pediatric): Secondary | ICD-10-CM | POA: Diagnosis not present

## 2020-07-08 DIAGNOSIS — I10 Essential (primary) hypertension: Secondary | ICD-10-CM | POA: Diagnosis not present

## 2020-07-08 DIAGNOSIS — M109 Gout, unspecified: Secondary | ICD-10-CM | POA: Diagnosis not present

## 2020-07-08 DIAGNOSIS — E1142 Type 2 diabetes mellitus with diabetic polyneuropathy: Secondary | ICD-10-CM | POA: Diagnosis not present

## 2020-07-08 DIAGNOSIS — G3281 Cerebellar ataxia in diseases classified elsewhere: Secondary | ICD-10-CM | POA: Diagnosis not present

## 2020-07-08 DIAGNOSIS — E785 Hyperlipidemia, unspecified: Secondary | ICD-10-CM | POA: Diagnosis not present

## 2020-07-14 DIAGNOSIS — G4733 Obstructive sleep apnea (adult) (pediatric): Secondary | ICD-10-CM | POA: Diagnosis not present

## 2020-07-14 DIAGNOSIS — E1142 Type 2 diabetes mellitus with diabetic polyneuropathy: Secondary | ICD-10-CM | POA: Diagnosis not present

## 2020-07-14 DIAGNOSIS — M109 Gout, unspecified: Secondary | ICD-10-CM | POA: Diagnosis not present

## 2020-07-14 DIAGNOSIS — Z6838 Body mass index (BMI) 38.0-38.9, adult: Secondary | ICD-10-CM | POA: Diagnosis not present

## 2020-07-14 DIAGNOSIS — L749 Eccrine sweat disorder, unspecified: Secondary | ICD-10-CM | POA: Diagnosis not present

## 2020-07-14 DIAGNOSIS — I10 Essential (primary) hypertension: Secondary | ICD-10-CM | POA: Diagnosis not present

## 2020-07-14 DIAGNOSIS — G3281 Cerebellar ataxia in diseases classified elsewhere: Secondary | ICD-10-CM | POA: Diagnosis not present

## 2020-07-14 DIAGNOSIS — E785 Hyperlipidemia, unspecified: Secondary | ICD-10-CM | POA: Diagnosis not present

## 2020-07-16 ENCOUNTER — Ambulatory Visit: Payer: Medicare HMO | Admitting: Adult Health

## 2020-07-16 ENCOUNTER — Encounter: Payer: Self-pay | Admitting: Adult Health

## 2020-07-17 DIAGNOSIS — E785 Hyperlipidemia, unspecified: Secondary | ICD-10-CM | POA: Diagnosis not present

## 2020-07-17 DIAGNOSIS — L749 Eccrine sweat disorder, unspecified: Secondary | ICD-10-CM | POA: Diagnosis not present

## 2020-07-17 DIAGNOSIS — I10 Essential (primary) hypertension: Secondary | ICD-10-CM | POA: Diagnosis not present

## 2020-07-17 DIAGNOSIS — E1140 Type 2 diabetes mellitus with diabetic neuropathy, unspecified: Secondary | ICD-10-CM | POA: Diagnosis not present

## 2020-07-21 DIAGNOSIS — L749 Eccrine sweat disorder, unspecified: Secondary | ICD-10-CM | POA: Diagnosis not present

## 2020-07-21 DIAGNOSIS — I10 Essential (primary) hypertension: Secondary | ICD-10-CM | POA: Diagnosis not present

## 2020-07-21 DIAGNOSIS — M109 Gout, unspecified: Secondary | ICD-10-CM | POA: Diagnosis not present

## 2020-07-21 DIAGNOSIS — E1142 Type 2 diabetes mellitus with diabetic polyneuropathy: Secondary | ICD-10-CM | POA: Diagnosis not present

## 2020-07-23 ENCOUNTER — Inpatient Hospital Stay: Payer: Medicare HMO | Admitting: Hematology & Oncology

## 2020-07-23 ENCOUNTER — Inpatient Hospital Stay: Payer: Medicare HMO

## 2020-07-23 ENCOUNTER — Inpatient Hospital Stay: Payer: Medicare HMO | Attending: Hematology & Oncology

## 2020-07-23 DIAGNOSIS — Z6838 Body mass index (BMI) 38.0-38.9, adult: Secondary | ICD-10-CM | POA: Diagnosis not present

## 2020-07-23 DIAGNOSIS — G3281 Cerebellar ataxia in diseases classified elsewhere: Secondary | ICD-10-CM | POA: Diagnosis not present

## 2020-07-23 DIAGNOSIS — I10 Essential (primary) hypertension: Secondary | ICD-10-CM | POA: Diagnosis not present

## 2020-07-23 DIAGNOSIS — L749 Eccrine sweat disorder, unspecified: Secondary | ICD-10-CM | POA: Diagnosis not present

## 2020-07-23 DIAGNOSIS — E1142 Type 2 diabetes mellitus with diabetic polyneuropathy: Secondary | ICD-10-CM | POA: Diagnosis not present

## 2020-07-23 DIAGNOSIS — G4733 Obstructive sleep apnea (adult) (pediatric): Secondary | ICD-10-CM | POA: Diagnosis not present

## 2020-07-23 DIAGNOSIS — M109 Gout, unspecified: Secondary | ICD-10-CM | POA: Diagnosis not present

## 2020-07-23 DIAGNOSIS — E785 Hyperlipidemia, unspecified: Secondary | ICD-10-CM | POA: Diagnosis not present

## 2020-07-25 DIAGNOSIS — N401 Enlarged prostate with lower urinary tract symptoms: Secondary | ICD-10-CM | POA: Diagnosis not present

## 2020-07-25 DIAGNOSIS — N138 Other obstructive and reflux uropathy: Secondary | ICD-10-CM | POA: Diagnosis not present

## 2020-07-29 DIAGNOSIS — G4737 Central sleep apnea in conditions classified elsewhere: Secondary | ICD-10-CM | POA: Diagnosis not present

## 2020-07-29 DIAGNOSIS — G4733 Obstructive sleep apnea (adult) (pediatric): Secondary | ICD-10-CM | POA: Diagnosis not present

## 2020-07-31 DIAGNOSIS — G4733 Obstructive sleep apnea (adult) (pediatric): Secondary | ICD-10-CM | POA: Diagnosis not present

## 2020-07-31 DIAGNOSIS — E785 Hyperlipidemia, unspecified: Secondary | ICD-10-CM | POA: Diagnosis not present

## 2020-07-31 DIAGNOSIS — Z6838 Body mass index (BMI) 38.0-38.9, adult: Secondary | ICD-10-CM | POA: Diagnosis not present

## 2020-07-31 DIAGNOSIS — G3281 Cerebellar ataxia in diseases classified elsewhere: Secondary | ICD-10-CM | POA: Diagnosis not present

## 2020-07-31 DIAGNOSIS — I10 Essential (primary) hypertension: Secondary | ICD-10-CM | POA: Diagnosis not present

## 2020-07-31 DIAGNOSIS — E1142 Type 2 diabetes mellitus with diabetic polyneuropathy: Secondary | ICD-10-CM | POA: Diagnosis not present

## 2020-07-31 DIAGNOSIS — M109 Gout, unspecified: Secondary | ICD-10-CM | POA: Diagnosis not present

## 2020-07-31 DIAGNOSIS — L749 Eccrine sweat disorder, unspecified: Secondary | ICD-10-CM | POA: Diagnosis not present

## 2020-08-14 ENCOUNTER — Telehealth: Payer: Self-pay

## 2020-08-14 NOTE — Telephone Encounter (Signed)
pt called and r/s his appt due to a conflict    Ronald Rice

## 2020-08-15 ENCOUNTER — Inpatient Hospital Stay: Payer: Medicare HMO | Admitting: Hematology & Oncology

## 2020-08-15 ENCOUNTER — Inpatient Hospital Stay: Payer: Medicare HMO

## 2020-08-15 DIAGNOSIS — N401 Enlarged prostate with lower urinary tract symptoms: Secondary | ICD-10-CM | POA: Diagnosis not present

## 2020-08-15 DIAGNOSIS — N138 Other obstructive and reflux uropathy: Secondary | ICD-10-CM | POA: Diagnosis not present

## 2020-08-16 DIAGNOSIS — R509 Fever, unspecified: Secondary | ICD-10-CM | POA: Diagnosis not present

## 2020-08-16 DIAGNOSIS — I10 Essential (primary) hypertension: Secondary | ICD-10-CM | POA: Diagnosis not present

## 2020-08-16 DIAGNOSIS — R2689 Other abnormalities of gait and mobility: Secondary | ICD-10-CM | POA: Diagnosis not present

## 2020-08-16 DIAGNOSIS — R0689 Other abnormalities of breathing: Secondary | ICD-10-CM | POA: Diagnosis not present

## 2020-08-16 DIAGNOSIS — R590 Localized enlarged lymph nodes: Secondary | ICD-10-CM | POA: Diagnosis not present

## 2020-08-16 DIAGNOSIS — R1110 Vomiting, unspecified: Secondary | ICD-10-CM | POA: Diagnosis not present

## 2020-08-16 DIAGNOSIS — R404 Transient alteration of awareness: Secondary | ICD-10-CM | POA: Diagnosis not present

## 2020-08-16 DIAGNOSIS — R000 Tachycardia, unspecified: Secondary | ICD-10-CM | POA: Diagnosis not present

## 2020-08-16 DIAGNOSIS — K429 Umbilical hernia without obstruction or gangrene: Secondary | ICD-10-CM | POA: Diagnosis not present

## 2020-08-16 DIAGNOSIS — E876 Hypokalemia: Secondary | ICD-10-CM | POA: Diagnosis not present

## 2020-08-16 DIAGNOSIS — E119 Type 2 diabetes mellitus without complications: Secondary | ICD-10-CM | POA: Diagnosis not present

## 2020-08-16 DIAGNOSIS — C9111 Chronic lymphocytic leukemia of B-cell type in remission: Secondary | ICD-10-CM | POA: Diagnosis not present

## 2020-08-16 DIAGNOSIS — K7689 Other specified diseases of liver: Secondary | ICD-10-CM | POA: Diagnosis not present

## 2020-08-16 DIAGNOSIS — A419 Sepsis, unspecified organism: Secondary | ICD-10-CM | POA: Diagnosis not present

## 2020-08-16 DIAGNOSIS — N281 Cyst of kidney, acquired: Secondary | ICD-10-CM | POA: Diagnosis not present

## 2020-08-17 DIAGNOSIS — R509 Fever, unspecified: Secondary | ICD-10-CM | POA: Diagnosis not present

## 2020-08-17 DIAGNOSIS — C9110 Chronic lymphocytic leukemia of B-cell type not having achieved remission: Secondary | ICD-10-CM | POA: Diagnosis not present

## 2020-08-17 DIAGNOSIS — R6510 Systemic inflammatory response syndrome (SIRS) of non-infectious origin without acute organ dysfunction: Secondary | ICD-10-CM | POA: Diagnosis not present

## 2020-08-17 DIAGNOSIS — I10 Essential (primary) hypertension: Secondary | ICD-10-CM | POA: Diagnosis not present

## 2020-08-17 DIAGNOSIS — I2510 Atherosclerotic heart disease of native coronary artery without angina pectoris: Secondary | ICD-10-CM | POA: Diagnosis not present

## 2020-08-17 DIAGNOSIS — N390 Urinary tract infection, site not specified: Secondary | ICD-10-CM | POA: Diagnosis not present

## 2020-08-17 DIAGNOSIS — N281 Cyst of kidney, acquired: Secondary | ICD-10-CM | POA: Diagnosis not present

## 2020-08-17 DIAGNOSIS — E119 Type 2 diabetes mellitus without complications: Secondary | ICD-10-CM | POA: Diagnosis not present

## 2020-08-17 DIAGNOSIS — K429 Umbilical hernia without obstruction or gangrene: Secondary | ICD-10-CM | POA: Diagnosis not present

## 2020-08-17 DIAGNOSIS — E876 Hypokalemia: Secondary | ICD-10-CM | POA: Diagnosis not present

## 2020-08-17 DIAGNOSIS — R4182 Altered mental status, unspecified: Secondary | ICD-10-CM | POA: Diagnosis not present

## 2020-08-17 DIAGNOSIS — K7689 Other specified diseases of liver: Secondary | ICD-10-CM | POA: Diagnosis not present

## 2020-08-18 DIAGNOSIS — I493 Ventricular premature depolarization: Secondary | ICD-10-CM | POA: Diagnosis not present

## 2020-08-18 DIAGNOSIS — R000 Tachycardia, unspecified: Secondary | ICD-10-CM | POA: Diagnosis not present

## 2020-08-18 DIAGNOSIS — N390 Urinary tract infection, site not specified: Secondary | ICD-10-CM | POA: Diagnosis not present

## 2020-08-20 DIAGNOSIS — G3281 Cerebellar ataxia in diseases classified elsewhere: Secondary | ICD-10-CM | POA: Diagnosis not present

## 2020-08-20 DIAGNOSIS — E1142 Type 2 diabetes mellitus with diabetic polyneuropathy: Secondary | ICD-10-CM | POA: Diagnosis not present

## 2020-08-20 DIAGNOSIS — C9110 Chronic lymphocytic leukemia of B-cell type not having achieved remission: Secondary | ICD-10-CM | POA: Diagnosis not present

## 2020-08-20 DIAGNOSIS — I10 Essential (primary) hypertension: Secondary | ICD-10-CM | POA: Diagnosis not present

## 2020-08-20 DIAGNOSIS — E1165 Type 2 diabetes mellitus with hyperglycemia: Secondary | ICD-10-CM | POA: Diagnosis not present

## 2020-08-20 DIAGNOSIS — C8331 Diffuse large B-cell lymphoma, lymph nodes of head, face, and neck: Secondary | ICD-10-CM | POA: Diagnosis not present

## 2020-08-20 DIAGNOSIS — I2510 Atherosclerotic heart disease of native coronary artery without angina pectoris: Secondary | ICD-10-CM | POA: Diagnosis not present

## 2020-08-20 DIAGNOSIS — B348 Other viral infections of unspecified site: Secondary | ICD-10-CM | POA: Diagnosis not present

## 2020-08-20 DIAGNOSIS — N400 Enlarged prostate without lower urinary tract symptoms: Secondary | ICD-10-CM | POA: Diagnosis not present

## 2020-08-21 DIAGNOSIS — G4733 Obstructive sleep apnea (adult) (pediatric): Secondary | ICD-10-CM | POA: Diagnosis not present

## 2020-08-27 DIAGNOSIS — E1142 Type 2 diabetes mellitus with diabetic polyneuropathy: Secondary | ICD-10-CM | POA: Diagnosis not present

## 2020-08-27 DIAGNOSIS — G3281 Cerebellar ataxia in diseases classified elsewhere: Secondary | ICD-10-CM | POA: Diagnosis not present

## 2020-08-27 DIAGNOSIS — B348 Other viral infections of unspecified site: Secondary | ICD-10-CM | POA: Diagnosis not present

## 2020-08-27 DIAGNOSIS — N400 Enlarged prostate without lower urinary tract symptoms: Secondary | ICD-10-CM | POA: Diagnosis not present

## 2020-08-27 DIAGNOSIS — I10 Essential (primary) hypertension: Secondary | ICD-10-CM | POA: Diagnosis not present

## 2020-08-27 DIAGNOSIS — I2510 Atherosclerotic heart disease of native coronary artery without angina pectoris: Secondary | ICD-10-CM | POA: Diagnosis not present

## 2020-08-27 DIAGNOSIS — C9110 Chronic lymphocytic leukemia of B-cell type not having achieved remission: Secondary | ICD-10-CM | POA: Diagnosis not present

## 2020-08-27 DIAGNOSIS — C8331 Diffuse large B-cell lymphoma, lymph nodes of head, face, and neck: Secondary | ICD-10-CM | POA: Diagnosis not present

## 2020-08-27 DIAGNOSIS — E1165 Type 2 diabetes mellitus with hyperglycemia: Secondary | ICD-10-CM | POA: Diagnosis not present

## 2020-08-28 DIAGNOSIS — N400 Enlarged prostate without lower urinary tract symptoms: Secondary | ICD-10-CM | POA: Diagnosis not present

## 2020-08-28 DIAGNOSIS — C8331 Diffuse large B-cell lymphoma, lymph nodes of head, face, and neck: Secondary | ICD-10-CM | POA: Diagnosis not present

## 2020-08-28 DIAGNOSIS — C9110 Chronic lymphocytic leukemia of B-cell type not having achieved remission: Secondary | ICD-10-CM | POA: Diagnosis not present

## 2020-08-28 DIAGNOSIS — B348 Other viral infections of unspecified site: Secondary | ICD-10-CM | POA: Diagnosis not present

## 2020-08-28 DIAGNOSIS — G3281 Cerebellar ataxia in diseases classified elsewhere: Secondary | ICD-10-CM | POA: Diagnosis not present

## 2020-08-28 DIAGNOSIS — I2510 Atherosclerotic heart disease of native coronary artery without angina pectoris: Secondary | ICD-10-CM | POA: Diagnosis not present

## 2020-08-28 DIAGNOSIS — E1142 Type 2 diabetes mellitus with diabetic polyneuropathy: Secondary | ICD-10-CM | POA: Diagnosis not present

## 2020-08-28 DIAGNOSIS — E1165 Type 2 diabetes mellitus with hyperglycemia: Secondary | ICD-10-CM | POA: Diagnosis not present

## 2020-08-28 DIAGNOSIS — I10 Essential (primary) hypertension: Secondary | ICD-10-CM | POA: Diagnosis not present

## 2020-08-29 DIAGNOSIS — G4737 Central sleep apnea in conditions classified elsewhere: Secondary | ICD-10-CM | POA: Diagnosis not present

## 2020-08-29 DIAGNOSIS — G4733 Obstructive sleep apnea (adult) (pediatric): Secondary | ICD-10-CM | POA: Diagnosis not present

## 2020-09-03 DIAGNOSIS — E1142 Type 2 diabetes mellitus with diabetic polyneuropathy: Secondary | ICD-10-CM | POA: Diagnosis not present

## 2020-09-03 DIAGNOSIS — N400 Enlarged prostate without lower urinary tract symptoms: Secondary | ICD-10-CM | POA: Diagnosis not present

## 2020-09-03 DIAGNOSIS — I10 Essential (primary) hypertension: Secondary | ICD-10-CM | POA: Diagnosis not present

## 2020-09-03 DIAGNOSIS — B348 Other viral infections of unspecified site: Secondary | ICD-10-CM | POA: Diagnosis not present

## 2020-09-03 DIAGNOSIS — C9110 Chronic lymphocytic leukemia of B-cell type not having achieved remission: Secondary | ICD-10-CM | POA: Diagnosis not present

## 2020-09-03 DIAGNOSIS — E1165 Type 2 diabetes mellitus with hyperglycemia: Secondary | ICD-10-CM | POA: Diagnosis not present

## 2020-09-03 DIAGNOSIS — I2510 Atherosclerotic heart disease of native coronary artery without angina pectoris: Secondary | ICD-10-CM | POA: Diagnosis not present

## 2020-09-03 DIAGNOSIS — C8331 Diffuse large B-cell lymphoma, lymph nodes of head, face, and neck: Secondary | ICD-10-CM | POA: Diagnosis not present

## 2020-09-03 DIAGNOSIS — G3281 Cerebellar ataxia in diseases classified elsewhere: Secondary | ICD-10-CM | POA: Diagnosis not present

## 2020-09-09 ENCOUNTER — Ambulatory Visit: Payer: Medicare HMO | Admitting: Adult Health

## 2020-09-10 DIAGNOSIS — B348 Other viral infections of unspecified site: Secondary | ICD-10-CM | POA: Diagnosis not present

## 2020-09-10 DIAGNOSIS — I10 Essential (primary) hypertension: Secondary | ICD-10-CM | POA: Diagnosis not present

## 2020-09-10 DIAGNOSIS — N400 Enlarged prostate without lower urinary tract symptoms: Secondary | ICD-10-CM | POA: Diagnosis not present

## 2020-09-10 DIAGNOSIS — C8331 Diffuse large B-cell lymphoma, lymph nodes of head, face, and neck: Secondary | ICD-10-CM | POA: Diagnosis not present

## 2020-09-10 DIAGNOSIS — G3281 Cerebellar ataxia in diseases classified elsewhere: Secondary | ICD-10-CM | POA: Diagnosis not present

## 2020-09-10 DIAGNOSIS — C9110 Chronic lymphocytic leukemia of B-cell type not having achieved remission: Secondary | ICD-10-CM | POA: Diagnosis not present

## 2020-09-10 DIAGNOSIS — E1142 Type 2 diabetes mellitus with diabetic polyneuropathy: Secondary | ICD-10-CM | POA: Diagnosis not present

## 2020-09-10 DIAGNOSIS — E1165 Type 2 diabetes mellitus with hyperglycemia: Secondary | ICD-10-CM | POA: Diagnosis not present

## 2020-09-10 DIAGNOSIS — I2510 Atherosclerotic heart disease of native coronary artery without angina pectoris: Secondary | ICD-10-CM | POA: Diagnosis not present

## 2020-09-11 DIAGNOSIS — B348 Other viral infections of unspecified site: Secondary | ICD-10-CM | POA: Diagnosis not present

## 2020-09-11 DIAGNOSIS — E1165 Type 2 diabetes mellitus with hyperglycemia: Secondary | ICD-10-CM | POA: Diagnosis not present

## 2020-09-11 DIAGNOSIS — C9110 Chronic lymphocytic leukemia of B-cell type not having achieved remission: Secondary | ICD-10-CM | POA: Diagnosis not present

## 2020-09-11 DIAGNOSIS — N400 Enlarged prostate without lower urinary tract symptoms: Secondary | ICD-10-CM | POA: Diagnosis not present

## 2020-09-11 DIAGNOSIS — I2510 Atherosclerotic heart disease of native coronary artery without angina pectoris: Secondary | ICD-10-CM | POA: Diagnosis not present

## 2020-09-11 DIAGNOSIS — I10 Essential (primary) hypertension: Secondary | ICD-10-CM | POA: Diagnosis not present

## 2020-09-11 DIAGNOSIS — G3281 Cerebellar ataxia in diseases classified elsewhere: Secondary | ICD-10-CM | POA: Diagnosis not present

## 2020-09-11 DIAGNOSIS — E1142 Type 2 diabetes mellitus with diabetic polyneuropathy: Secondary | ICD-10-CM | POA: Diagnosis not present

## 2020-09-11 DIAGNOSIS — C8331 Diffuse large B-cell lymphoma, lymph nodes of head, face, and neck: Secondary | ICD-10-CM | POA: Diagnosis not present

## 2020-09-16 DIAGNOSIS — N400 Enlarged prostate without lower urinary tract symptoms: Secondary | ICD-10-CM | POA: Diagnosis not present

## 2020-09-16 DIAGNOSIS — E1142 Type 2 diabetes mellitus with diabetic polyneuropathy: Secondary | ICD-10-CM | POA: Diagnosis not present

## 2020-09-16 DIAGNOSIS — B348 Other viral infections of unspecified site: Secondary | ICD-10-CM | POA: Diagnosis not present

## 2020-09-16 DIAGNOSIS — E1165 Type 2 diabetes mellitus with hyperglycemia: Secondary | ICD-10-CM | POA: Diagnosis not present

## 2020-09-16 DIAGNOSIS — C9110 Chronic lymphocytic leukemia of B-cell type not having achieved remission: Secondary | ICD-10-CM | POA: Diagnosis not present

## 2020-09-16 DIAGNOSIS — G3281 Cerebellar ataxia in diseases classified elsewhere: Secondary | ICD-10-CM | POA: Diagnosis not present

## 2020-09-16 DIAGNOSIS — C8331 Diffuse large B-cell lymphoma, lymph nodes of head, face, and neck: Secondary | ICD-10-CM | POA: Diagnosis not present

## 2020-09-16 DIAGNOSIS — I10 Essential (primary) hypertension: Secondary | ICD-10-CM | POA: Diagnosis not present

## 2020-09-16 DIAGNOSIS — I2510 Atherosclerotic heart disease of native coronary artery without angina pectoris: Secondary | ICD-10-CM | POA: Diagnosis not present

## 2020-09-17 ENCOUNTER — Inpatient Hospital Stay: Payer: Medicare HMO

## 2020-09-17 ENCOUNTER — Telehealth: Payer: Self-pay

## 2020-09-17 ENCOUNTER — Inpatient Hospital Stay: Payer: Medicare HMO | Attending: Hematology & Oncology

## 2020-09-17 ENCOUNTER — Inpatient Hospital Stay (HOSPITAL_BASED_OUTPATIENT_CLINIC_OR_DEPARTMENT_OTHER): Payer: Medicare HMO | Admitting: Hematology & Oncology

## 2020-09-17 ENCOUNTER — Other Ambulatory Visit: Payer: Self-pay

## 2020-09-17 ENCOUNTER — Encounter: Payer: Self-pay | Admitting: Hematology & Oncology

## 2020-09-17 VITALS — BP 128/81 | HR 87 | Temp 97.6°F | Resp 17 | Wt 266.0 lb

## 2020-09-17 DIAGNOSIS — Z8572 Personal history of non-Hodgkin lymphomas: Secondary | ICD-10-CM | POA: Diagnosis not present

## 2020-09-17 DIAGNOSIS — Z9221 Personal history of antineoplastic chemotherapy: Secondary | ICD-10-CM | POA: Diagnosis not present

## 2020-09-17 DIAGNOSIS — E119 Type 2 diabetes mellitus without complications: Secondary | ICD-10-CM | POA: Diagnosis not present

## 2020-09-17 DIAGNOSIS — Z95828 Presence of other vascular implants and grafts: Secondary | ICD-10-CM

## 2020-09-17 DIAGNOSIS — C8338 Diffuse large B-cell lymphoma, lymph nodes of multiple sites: Secondary | ICD-10-CM | POA: Diagnosis not present

## 2020-09-17 DIAGNOSIS — Z8616 Personal history of COVID-19: Secondary | ICD-10-CM | POA: Diagnosis not present

## 2020-09-17 DIAGNOSIS — Z923 Personal history of irradiation: Secondary | ICD-10-CM | POA: Insufficient documentation

## 2020-09-17 LAB — CBC WITH DIFFERENTIAL (CANCER CENTER ONLY)
Abs Immature Granulocytes: 0.02 10*3/uL (ref 0.00–0.07)
Basophils Absolute: 0 10*3/uL (ref 0.0–0.1)
Basophils Relative: 0 %
Eosinophils Absolute: 0.1 10*3/uL (ref 0.0–0.5)
Eosinophils Relative: 2 %
HCT: 38.1 % — ABNORMAL LOW (ref 39.0–52.0)
Hemoglobin: 13.8 g/dL (ref 13.0–17.0)
Immature Granulocytes: 0 %
Lymphocytes Relative: 56 %
Lymphs Abs: 5 10*3/uL — ABNORMAL HIGH (ref 0.7–4.0)
MCH: 31.2 pg (ref 26.0–34.0)
MCHC: 36.2 g/dL — ABNORMAL HIGH (ref 30.0–36.0)
MCV: 86.2 fL (ref 80.0–100.0)
Monocytes Absolute: 1.3 10*3/uL — ABNORMAL HIGH (ref 0.1–1.0)
Monocytes Relative: 14 %
Neutro Abs: 2.5 10*3/uL (ref 1.7–7.7)
Neutrophils Relative %: 28 %
Platelet Count: 124 10*3/uL — ABNORMAL LOW (ref 150–400)
RBC: 4.42 MIL/uL (ref 4.22–5.81)
RDW: 12.6 % (ref 11.5–15.5)
WBC Count: 8.9 10*3/uL (ref 4.0–10.5)
nRBC: 0 % (ref 0.0–0.2)

## 2020-09-17 LAB — CMP (CANCER CENTER ONLY)
ALT: 26 U/L (ref 0–44)
AST: 23 U/L (ref 15–41)
Albumin: 4.5 g/dL (ref 3.5–5.0)
Alkaline Phosphatase: 55 U/L (ref 38–126)
Anion gap: 8 (ref 5–15)
BUN: 12 mg/dL (ref 8–23)
CO2: 31 mmol/L (ref 22–32)
Calcium: 9.9 mg/dL (ref 8.9–10.3)
Chloride: 103 mmol/L (ref 98–111)
Creatinine: 1.14 mg/dL (ref 0.61–1.24)
GFR, Estimated: >60 mL/min (ref >60)
Glucose, Bld: 119 mg/dL — ABNORMAL HIGH (ref 70–99)
Potassium: 3.3 mmol/L — ABNORMAL LOW (ref 3.5–5.1)
Sodium: 142 mmol/L (ref 135–145)
Total Bilirubin: 0.6 mg/dL (ref 0.3–1.2)
Total Protein: 6.9 g/dL (ref 6.5–8.1)

## 2020-09-17 LAB — LACTATE DEHYDROGENASE: LDH: 173 U/L (ref 98–192)

## 2020-09-17 LAB — HEMOGLOBIN A1C
Hgb A1c MFr Bld: 6.4 % — ABNORMAL HIGH (ref 4.8–5.6)
Mean Plasma Glucose: 136.98 mg/dL

## 2020-09-17 MED ORDER — SODIUM CHLORIDE 0.9% FLUSH
10.0000 mL | Freq: Once | INTRAVENOUS | Status: AC
Start: 2020-09-17 — End: 2020-09-17
  Administered 2020-09-17: 10 mL via INTRAVENOUS
  Filled 2020-09-17: qty 10

## 2020-09-17 MED ORDER — HEPARIN SOD (PORK) LOCK FLUSH 100 UNIT/ML IV SOLN
500.0000 [IU] | Freq: Once | INTRAVENOUS | Status: AC
Start: 2020-09-17 — End: 2020-09-17
  Administered 2020-09-17: 500 [IU] via INTRAVENOUS
  Filled 2020-09-17: qty 5

## 2020-09-17 NOTE — Patient Instructions (Signed)
Implanted Port Insertion, Care After This sheet gives you information about how to care for yourself after your procedure. Your health care provider may also give you more specific instructions. If you have problems or questions, contact your health care provider. What can I expect after the procedure? After the procedure, it is common to have:  Discomfort at the port insertion site.  Bruising on the skin over the port. This should improve over 3-4 days. Follow these instructions at home: Port care  After your port is placed, you will get a manufacturer's information card. The card has information about your port. Keep this card with you at all times.  Take care of the port as told by your health care provider. Ask your health care provider if you or a family member can get training for taking care of the port at home. A home health care nurse may also take care of the port.  Make sure to remember what type of port you have. Incision care  Follow instructions from your health care provider about how to take care of your port insertion site. Make sure you: ? Wash your hands with soap and water before and after you change your bandage (dressing). If soap and water are not available, use hand sanitizer. ? Change your dressing as told by your health care provider. ? Leave stitches (sutures), skin glue, or adhesive strips in place. These skin closures may need to stay in place for 2 weeks or longer. If adhesive strip edges start to loosen and curl up, you may trim the loose edges. Do not remove adhesive strips completely unless your health care provider tells you to do that.  Check your port insertion site every day for signs of infection. Check for: ? Redness, swelling, or pain. ? Fluid or blood. ? Warmth. ? Pus or a bad smell.      Activity  Return to your normal activities as told by your health care provider. Ask your health care provider what activities are safe for you.  Do not  lift anything that is heavier than 10 lb (4.5 kg), or the limit that you are told, until your health care provider says that it is safe. General instructions  Take over-the-counter and prescription medicines only as told by your health care provider.  Do not take baths, swim, or use a hot tub until your health care provider approves. Ask your health care provider if you may take showers. You may only be allowed to take sponge baths.  Do not drive for 24 hours if you were given a sedative during your procedure.  Wear a medical alert bracelet in case of an emergency. This will tell any health care providers that you have a port.  Keep all follow-up visits as told by your health care provider. This is important. Contact a health care provider if:  You cannot flush your port with saline as directed, or you cannot draw blood from the port.  You have a fever or chills.  You have redness, swelling, or pain around your port insertion site.  You have fluid or blood coming from your port insertion site.  Your port insertion site feels warm to the touch.  You have pus or a bad smell coming from the port insertion site. Get help right away if:  You have chest pain or shortness of breath.  You have bleeding from your port that you cannot control. Summary  Take care of the port as told by your   health care provider. Keep the manufacturer's information card with you at all times.  Change your dressing as told by your health care provider.  Contact a health care provider if you have a fever or chills or if you have redness, swelling, or pain around your port insertion site.  Keep all follow-up visits as told by your health care provider. This information is not intended to replace advice given to you by your health care provider. Make sure you discuss any questions you have with your health care provider. Document Revised: 12/06/2017 Document Reviewed: 12/06/2017 Elsevier Patient Education   2021 Elsevier Inc.  

## 2020-09-17 NOTE — Telephone Encounter (Signed)
appts made and printed for pt per 09/17/20 los   Ronald Rice 

## 2020-09-17 NOTE — Progress Notes (Signed)
Hematology and Oncology Follow Up Visit  Ronald Rice [id number] 09/05/1945 75 y.o. 09/17/2020   Principle Diagnosis:   Diffuse large cell non-Hodgkin's lymphoma-Richter's transformation from CLL  Current Therapy:    R-CHOP-s/p cycle #8-- started on 11/08/2018  XRT to the LEFT neck -- 4500 rad -- completed on 08/01/2019     Interim History:  Ronald Rice is back for for follow-up.  We last saw him back in October.  He went down to [place] for Christmas to visit family.  Unfortunately, he developed COVID while he was down there.  He was isolated for 2 weeks.  He seems to be managing okay right now.  His diabetes still is a problem.  His blood sugar is better today.  He does have neuropathy.  He is felt more fullness in the left neck.  This is where his lymphoma initially presented.  This only would not be a bad idea to do a PET scan on him and see how everything looks.  He has had no problems with his bowels or bladder.  He has had a little bit of diarrhea.  He has had no bleeding.  His legs are doing okay.  He did fall in the past and I think he broke his left foot or left leg.  He seems to be improving with this.  Currently, his performance status is ECOG 1.     Medications:  Current Outpatient Medications:  .  acetaminophen (TYLENOL) 500 MG tablet, Take 1,000 mg by mouth every 6 (six) hours as needed (pain). , Disp: , Rfl:  .  allopurinol (ZYLOPRIM) 300 MG tablet, Take 300 mg by mouth daily., Disp: , Rfl:  .  aspirin EC 81 MG tablet, Take 81 mg by mouth daily., Disp: , Rfl:  .  atenolol (TENORMIN) 25 MG tablet, Take 25 mg by mouth daily. , Disp: , Rfl:  .  atorvastatin (LIPITOR) 40 MG tablet, Take 40 mg by mouth daily., Disp: , Rfl:  .  Cholecalciferol (VITAMIN D3 PO), Take 1 tablet by mouth daily., Disp: , Rfl:  .  CINNAMON PO, Take 1 tablet by mouth daily., Disp: , Rfl:  .  diclofenac sodium (VOLTAREN) 1 % GEL, Apply 1 application topically as needed (pain). , Disp: , Rfl:  .   donepezil (ARICEPT ODT) 5 MG disintegrating tablet, Take 5 mg by mouth at bedtime., Disp: , Rfl:  .  DULoxetine (CYMBALTA) 60 MG capsule, TAKE 1 CAPSULE(60 MG) BY MOUTH DAILY, Disp: 90 capsule, Rfl: 3 .  famotidine (PEPCID) 20 MG tablet, Take 20 mg by mouth at bedtime as needed for heartburn.  (Patient not taking: Reported on 03/26/2020), Disp: , Rfl:  .  ipratropium (ATROVENT) 0.06 % nasal spray, Place 1 spray into both nostrils daily as needed for rhinitis. , Disp: , Rfl:  .  Lancets (ONETOUCH DELICA PLUS [id number]) MISC, , Disp: , Rfl:  .  lidocaine-prilocaine (EMLA) cream, Apply to PAC 1 hour prior to procedure. (Patient not taking: Reported on 03/26/2020), Disp: 30 g, Rfl: 3 .  lisinopril (ZESTRIL) 40 MG tablet, Take 1 tablet (40 mg total) by mouth daily for 30 days., Disp: 30 tablet, Rfl: 0 .  metFORMIN (GLUCOPHAGE-XR) 500 MG 24 hr tablet, Take 500 mg by mouth every evening. , Disp: , Rfl:  .  Na Sulfate-K Sulfate-Mg Sulf 17.5-3.13-1.6 GM/177ML SOLN, Suprep (no substitutions)-TAKE AS DIRECTED., Disp: 354 mL, Rfl: 0 .  NON FORMULARY, Take 2 tablets by mouth daily. Blood Sugar Defense , Disp: , Rfl:  .  potassium chloride (K-DUR) 10 MEQ tablet, Take 10 mEq by mouth daily. , Disp: , Rfl:  .  pyridoxine (B-6) 250 MG tablet, Take 250 mg by mouth daily., Disp: , Rfl:  .  tamsulosin (FLOMAX) 0.4 MG CAPS capsule, Take 0.4 mg by mouth daily., Disp: , Rfl:  .  traMADol (ULTRAM) 50 MG tablet, TAKE 1 TABLET BY MOUTH EVERY 6 HOURS AS NEEDED (Patient taking differently: Take 50 mg by mouth every 6 (six) hours as needed for moderate pain. ), Disp: 90 tablet, Rfl: 0  Allergies:  No Known Allergies  Past Medical History, Surgical history, Social history, and Family History were reviewed and updated.  Review of Systems: Review of Systems  Constitutional: Negative.   HENT:  Negative.   Eyes: Negative.   Respiratory: Negative.   Cardiovascular: Negative.   Gastrointestinal: Negative.   Endocrine:  Negative.   Genitourinary: Positive for nocturia.   Musculoskeletal: Positive for arthralgias, gait problem and myalgias.  Neurological: Positive for gait problem.  Hematological: Negative.   Psychiatric/Behavioral: Negative.     Physical Exam:  vitals were not taken for this visit.   Wt Readings from Last 3 Encounters:  03/26/20 281 lb (127.5 kg)  02/28/20 284 lb (128.8 kg)  01/14/20 286 lb (129.7 kg)    Physical Exam Vitals reviewed.  HENT:     Head: Normocephalic and atraumatic.  Eyes:     Pupils: Pupils are equal, round, and reactive to light.  Neck:     Comments: Examination of his neck shows marked improvement in the adenopathy in the left neck.  He still has some palpable posterior cervical lymphadenopathy.  It probably measures about 2 x 2 cm.  It is not as firm.  It is somewhat mobile.. Cardiovascular:     Rate and Rhythm: Normal rate and regular rhythm.     Heart sounds: Normal heart sounds.  Pulmonary:     Effort: Pulmonary effort is normal.     Breath sounds: Normal breath sounds.  Abdominal:     General: Bowel sounds are normal.     Palpations: Abdomen is soft.  Musculoskeletal:        General: No tenderness or deformity. Normal range of motion.     Comments: He has a cast on the right foot and lower leg.  This goes about two thirds the way up his right leg.  Left leg is unremarkable.  He has no swelling.  He has decent pulses.  He has good range of motion of his joints.  Lymphadenopathy:     Cervical: No cervical adenopathy.  Skin:    General: Skin is warm and dry.     Findings: No erythema or rash.  Neurological:     Mental Status: He is alert and oriented to person, place, and time.  Psychiatric:        Behavior: Behavior normal.        Thought Content: Thought content normal.        Judgment: Judgment normal.      Lab Results  Component Value Date   WBC 8.9 09/17/2020   HGB 13.8 09/17/2020   HCT 38.1 (L) 09/17/2020   MCV 86.2 09/17/2020   PLT  124 (L) 09/17/2020     Chemistry      Component Value Date/Time   NA 142 09/17/2020 1440   NA 142 01/25/2017 1217   K 3.3 (L) 09/17/2020 1440   K 4.1 01/25/2017 1217   CL 103 09/17/2020 1440   CL  102 01/25/2017 1217   CO2 31 09/17/2020 1440   CO2 33 01/25/2017 1217   BUN 12 09/17/2020 1440   BUN 12 01/25/2017 1217   CREATININE 1.14 09/17/2020 1440   CREATININE 1.4 (H) 01/25/2017 1217      Component Value Date/Time   CALCIUM 9.9 09/17/2020 1440   CALCIUM 9.7 01/25/2017 1217   ALKPHOS 55 09/17/2020 1440   ALKPHOS 59 01/25/2017 1217   AST 23 09/17/2020 1440   ALT 26 09/17/2020 1440   ALT 32 01/25/2017 1217   BILITOT 0.6 09/17/2020 1440      Impression and Plan: Ronald Rice is a 75 year old African-American male.  He was diagnosed with CLL back in [place].  He then had transformation to a large cell non-Hodgkin's lymphoma.  He was treated with chemotherapy followed by radiation therapy to the neck given that he had bulky disease.   We will go ahead with another PET scan on him.  I think this will be very important.  Again he is at risk for recurrence.  I am sad that he had the [place] when he was down visiting over the holidays in [place].  Hopefully, his diabetes is going to be okay.  We will plan to get him back to see [place] in another couple months just to follow-up with everything.   Volanda Napoleon, MD 4/27/20223:46 PM

## 2020-09-19 DIAGNOSIS — C9110 Chronic lymphocytic leukemia of B-cell type not having achieved remission: Secondary | ICD-10-CM | POA: Diagnosis not present

## 2020-09-19 DIAGNOSIS — E1165 Type 2 diabetes mellitus with hyperglycemia: Secondary | ICD-10-CM | POA: Diagnosis not present

## 2020-09-19 DIAGNOSIS — N400 Enlarged prostate without lower urinary tract symptoms: Secondary | ICD-10-CM | POA: Diagnosis not present

## 2020-09-19 DIAGNOSIS — E1142 Type 2 diabetes mellitus with diabetic polyneuropathy: Secondary | ICD-10-CM | POA: Diagnosis not present

## 2020-09-19 DIAGNOSIS — B348 Other viral infections of unspecified site: Secondary | ICD-10-CM | POA: Diagnosis not present

## 2020-09-19 DIAGNOSIS — G3281 Cerebellar ataxia in diseases classified elsewhere: Secondary | ICD-10-CM | POA: Diagnosis not present

## 2020-09-19 DIAGNOSIS — I10 Essential (primary) hypertension: Secondary | ICD-10-CM | POA: Diagnosis not present

## 2020-09-19 DIAGNOSIS — C8331 Diffuse large B-cell lymphoma, lymph nodes of head, face, and neck: Secondary | ICD-10-CM | POA: Diagnosis not present

## 2020-09-19 DIAGNOSIS — I2510 Atherosclerotic heart disease of native coronary artery without angina pectoris: Secondary | ICD-10-CM | POA: Diagnosis not present

## 2020-09-23 DIAGNOSIS — E1142 Type 2 diabetes mellitus with diabetic polyneuropathy: Secondary | ICD-10-CM | POA: Diagnosis not present

## 2020-09-23 DIAGNOSIS — N400 Enlarged prostate without lower urinary tract symptoms: Secondary | ICD-10-CM | POA: Diagnosis not present

## 2020-09-23 DIAGNOSIS — B348 Other viral infections of unspecified site: Secondary | ICD-10-CM | POA: Diagnosis not present

## 2020-09-23 DIAGNOSIS — C9110 Chronic lymphocytic leukemia of B-cell type not having achieved remission: Secondary | ICD-10-CM | POA: Diagnosis not present

## 2020-09-23 DIAGNOSIS — E1165 Type 2 diabetes mellitus with hyperglycemia: Secondary | ICD-10-CM | POA: Diagnosis not present

## 2020-09-23 DIAGNOSIS — C8331 Diffuse large B-cell lymphoma, lymph nodes of head, face, and neck: Secondary | ICD-10-CM | POA: Diagnosis not present

## 2020-09-23 DIAGNOSIS — G3281 Cerebellar ataxia in diseases classified elsewhere: Secondary | ICD-10-CM | POA: Diagnosis not present

## 2020-09-23 DIAGNOSIS — I10 Essential (primary) hypertension: Secondary | ICD-10-CM | POA: Diagnosis not present

## 2020-09-23 DIAGNOSIS — I2510 Atherosclerotic heart disease of native coronary artery without angina pectoris: Secondary | ICD-10-CM | POA: Diagnosis not present

## 2020-09-28 DIAGNOSIS — G4737 Central sleep apnea in conditions classified elsewhere: Secondary | ICD-10-CM | POA: Diagnosis not present

## 2020-09-28 DIAGNOSIS — G4733 Obstructive sleep apnea (adult) (pediatric): Secondary | ICD-10-CM | POA: Diagnosis not present

## 2020-10-02 DIAGNOSIS — C8331 Diffuse large B-cell lymphoma, lymph nodes of head, face, and neck: Secondary | ICD-10-CM | POA: Diagnosis not present

## 2020-10-02 DIAGNOSIS — G3281 Cerebellar ataxia in diseases classified elsewhere: Secondary | ICD-10-CM | POA: Diagnosis not present

## 2020-10-02 DIAGNOSIS — B348 Other viral infections of unspecified site: Secondary | ICD-10-CM | POA: Diagnosis not present

## 2020-10-02 DIAGNOSIS — I10 Essential (primary) hypertension: Secondary | ICD-10-CM | POA: Diagnosis not present

## 2020-10-02 DIAGNOSIS — E1142 Type 2 diabetes mellitus with diabetic polyneuropathy: Secondary | ICD-10-CM | POA: Diagnosis not present

## 2020-10-02 DIAGNOSIS — I2510 Atherosclerotic heart disease of native coronary artery without angina pectoris: Secondary | ICD-10-CM | POA: Diagnosis not present

## 2020-10-02 DIAGNOSIS — E1165 Type 2 diabetes mellitus with hyperglycemia: Secondary | ICD-10-CM | POA: Diagnosis not present

## 2020-10-02 DIAGNOSIS — N400 Enlarged prostate without lower urinary tract symptoms: Secondary | ICD-10-CM | POA: Diagnosis not present

## 2020-10-02 DIAGNOSIS — C9110 Chronic lymphocytic leukemia of B-cell type not having achieved remission: Secondary | ICD-10-CM | POA: Diagnosis not present

## 2020-10-03 ENCOUNTER — Other Ambulatory Visit: Payer: Self-pay

## 2020-10-03 ENCOUNTER — Ambulatory Visit (HOSPITAL_COMMUNITY)
Admission: RE | Admit: 2020-10-03 | Discharge: 2020-10-03 | Disposition: A | Discharge status: Home / Self Care | Payer: Medicare HMO | Source: Ambulatory Visit | Attending: Hematology & Oncology | Admitting: Hematology & Oncology

## 2020-10-03 DIAGNOSIS — C8331 Diffuse large B-cell lymphoma, lymph nodes of head, face, and neck: Secondary | ICD-10-CM | POA: Diagnosis not present

## 2020-10-03 DIAGNOSIS — R591 Generalized enlarged lymph nodes: Secondary | ICD-10-CM | POA: Diagnosis not present

## 2020-10-03 DIAGNOSIS — C8338 Diffuse large B-cell lymphoma, lymph nodes of multiple sites: Secondary | ICD-10-CM | POA: Diagnosis not present

## 2020-10-03 LAB — GLUCOSE, CAPILLARY: Glucose-Capillary: 134 mg/dL — ABNORMAL HIGH (ref 70–99)

## 2020-10-03 MED ORDER — FLUDEOXYGLUCOSE F - 18 (FDG) INJECTION
13.2200 | Freq: Once | INTRAVENOUS | Status: AC | PRN
  Administered 2020-10-03: 13.22 via INTRAVENOUS

## 2020-10-07 ENCOUNTER — Inpatient Hospital Stay: Payer: Medicare HMO | Attending: Hematology & Oncology | Admitting: Hematology & Oncology

## 2020-10-07 ENCOUNTER — Encounter: Payer: Self-pay | Admitting: Hematology & Oncology

## 2020-10-07 ENCOUNTER — Other Ambulatory Visit: Payer: Self-pay

## 2020-10-07 VITALS — BP 99/56 | HR 81 | Temp 98.3°F | Resp 18 | Ht 74.0 in | Wt 276.1 lb

## 2020-10-07 DIAGNOSIS — R590 Localized enlarged lymph nodes: Secondary | ICD-10-CM | POA: Insufficient documentation

## 2020-10-07 DIAGNOSIS — Z8572 Personal history of non-Hodgkin lymphomas: Secondary | ICD-10-CM | POA: Insufficient documentation

## 2020-10-07 DIAGNOSIS — C8338 Diffuse large B-cell lymphoma, lymph nodes of multiple sites: Secondary | ICD-10-CM

## 2020-10-08 ENCOUNTER — Telehealth: Payer: Self-pay

## 2020-10-08 NOTE — Telephone Encounter (Signed)
No 10/07/20 LOS   Ronald Rice 

## 2020-10-08 NOTE — Progress Notes (Signed)
Hematology and Oncology Follow Up Visit  Ronald Rice [id number] [birth year] 75 y.o. 10/08/2020   Principle Diagnosis:   Diffuse large cell non-Hodgkin's lymphoma-Richter's transformation from CLL  Current Therapy:    R-CHOP-s/p cycle #8-- started on 11/08/2018  XRT to the LEFT neck -- 4500 rad -- completed on 08/01/2019     Interim History:  [name] is back for for an early visit.  We did do a PET scan on him.  This was done on 10/03/2020.  Surprising, the PET scan does show active lymph nodes now.  There is new lymphadenopathy with activity in the neck, chest, abdomen.  The axilla also has some active lymph nodes.  None of the lymph nodes were all that large.  The activity is not all that great.  I just have to wonder if the lymphoma was not recurred.  1 of a little bit troubled by is the lack of obvious adenopathy that he has palpable that is palpable.  Also, the SUV is now that great on the lymph nodes.  Since he initially presented with CLL that we treated, I just wonder if somehow this is not a low-grade type of process.  His diabetes is about the same.  He has neuropathy from back surgeries.  He uses a rolling walker to get around.  He has had no fever.  He has had no sweats.  There is been no rashes.  His appetite has been pretty decent.  He has had no change in bowel or bladder habits.  Thankfully, he comes in with his wife who is very supportive.  She is truly a good advocate for him.   Currently, his performance status is ECOG 1.     Medications:  Current Outpatient Medications:  .  acetaminophen (TYLENOL) 500 MG tablet, Take 1,000 mg by mouth every 6 (six) hours as needed (pain). , Disp: , Rfl:  .  allopurinol (ZYLOPRIM) 300 MG tablet, Take 300 mg by mouth daily., Disp: , Rfl:  .  aspirin EC 81 MG tablet, Take 81 mg by mouth daily., Disp: , Rfl:  .  atenolol (TENORMIN) 25 MG tablet, Take 25 mg by mouth daily. , Disp: , Rfl:  .  atorvastatin (LIPITOR) 40 MG tablet,  Take 40 mg by mouth daily., Disp: , Rfl:  .  chlorthalidone (HYGROTON) 25 MG tablet, Take 25 mg by mouth daily with breakfast., Disp: , Rfl:  .  Cholecalciferol (VITAMIN D3 PO), Take 1 tablet by mouth daily., Disp: , Rfl:  .  CINNAMON PO, Take 1 tablet by mouth daily., Disp: , Rfl:  .  diclofenac sodium (VOLTAREN) 1 % GEL, Apply 1 application topically as needed (pain). , Disp: , Rfl:  .  donepezil (ARICEPT ODT) 5 MG disintegrating tablet, Take 5 mg by mouth at bedtime., Disp: , Rfl:  .  DULoxetine (CYMBALTA) 60 MG capsule, TAKE 1 CAPSULE(60 MG) BY MOUTH DAILY, Disp: 90 capsule, Rfl: 3 .  famotidine (PEPCID) 20 MG tablet, Take 20 mg by mouth at bedtime as needed for heartburn., Disp: , Rfl:  .  ipratropium (ATROVENT) 0.06 % nasal spray, Place 1 spray into both nostrils daily as needed for rhinitis. , Disp: , Rfl:  .  Lancets (ONETOUCH DELICA PLUS [id number]) MISC, , Disp: , Rfl:  .  lidocaine-prilocaine (EMLA) cream, Apply to PAC 1 hour prior to procedure., Disp: 30 g, Rfl: 3 .  lisinopril (ZESTRIL) 40 MG tablet, Take 1 tablet (40 mg total) by mouth daily for 30 days., Disp:  30 tablet, Rfl: 0 .  loratadine (CLARITIN) 10 MG tablet, Take 10 mg by mouth as needed., Disp: , Rfl:  .  metFORMIN (GLUCOPHAGE-XR) 500 MG 24 hr tablet, Take 500 mg by mouth every evening. , Disp: , Rfl:  .  Na Sulfate-K Sulfate-Mg Sulf 17.5-3.13-1.6 GM/177ML SOLN, Suprep (no substitutions)-TAKE AS DIRECTED., Disp: 354 mL, Rfl: 0 .  NON FORMULARY, Take 2 tablets by mouth daily. Blood Sugar Defense, Disp: , Rfl:  .  potassium chloride (K-DUR) 10 MEQ tablet, Take 10 mEq by mouth daily. , Disp: , Rfl:  .  pyridoxine (B-6) 250 MG tablet, Take 250 mg by mouth daily., Disp: , Rfl:  .  Semaglutide,0.25 or 0.5MG /DOS, (OZEMPIC, 0.25 OR 0.5 MG/DOSE,) 2 MG/1.5ML SOPN, 0.25-0.5 mg, Disp: , Rfl:  .  tadalafil (CIALIS) 20 MG tablet, Take 20 mg by mouth as needed., Disp: , Rfl:  .  tamsulosin (FLOMAX) 0.4 MG CAPS capsule, Take 0.4 mg by  mouth daily., Disp: , Rfl:  .  tiZANidine (ZANAFLEX) 4 MG tablet, Take 4 mg by mouth 2 (two) times daily as needed., Disp: , Rfl:  .  traMADol (ULTRAM) 50 MG tablet, TAKE 1 TABLET BY MOUTH EVERY 6 HOURS AS NEEDED (Patient taking differently: Take 50 mg by mouth every 6 (six) hours as needed for moderate pain.), Disp: 90 tablet, Rfl: 0  Allergies:  No Known Allergies  Past Medical History, Surgical history, Social history, and Family History were reviewed and updated.  Review of Systems: Review of Systems  Constitutional: Negative.   HENT:  Negative.   Eyes: Negative.   Respiratory: Negative.   Cardiovascular: Negative.   Gastrointestinal: Negative.   Endocrine: Negative.   Genitourinary: Positive for nocturia.   Musculoskeletal: Positive for arthralgias, gait problem and myalgias.  Neurological: Positive for gait problem.  Hematological: Negative.   Psychiatric/Behavioral: Negative.     Physical Exam:  height is 6\' 2"  (1.88 m) and weight is 276 lb 1.9 oz (125.2 kg). His oral temperature is 98.3 F (36.8 C). His blood pressure is 99/56 (abnormal) and his pulse is 81. His respiration is 18 and oxygen saturation is 100%.   Wt Readings from Last 3 Encounters:  10/07/20 276 lb 1.9 oz (125.2 kg)  09/17/20 266 lb (120.7 kg)  03/26/20 281 lb (127.5 kg)    Physical Exam Vitals reviewed.  HENT:     Head: Normocephalic and atraumatic.  Eyes:     Pupils: Pupils are equal, round, and reactive to light.  Neck:     Comments: Examination of his neck shows no obvious palpable adenopathy.  He has some changes from radiation on the left side of the neck.  I cannot palpate any supraclavicular lymph nodes.   Cardiovascular:     Rate and Rhythm: Normal rate and regular rhythm.     Heart sounds: Normal heart sounds.  Pulmonary:     Effort: Pulmonary effort is normal.     Breath sounds: Normal breath sounds.  Abdominal:     General: Bowel sounds are normal.     Palpations: Abdomen is soft.   Musculoskeletal:        General: No tenderness or deformity. Normal range of motion.  Lymphadenopathy:     Cervical: No cervical adenopathy.  Skin:    General: Skin is warm and dry.     Findings: No erythema or rash.  Neurological:     Mental Status: He is alert and oriented to person, place, and time.  Psychiatric:  Behavior: Behavior normal.        Thought Content: Thought content normal.        Judgment: Judgment normal.    Lab Results  Component Value Date   WBC 8.9 09/17/2020   HGB 13.8 09/17/2020   HCT 38.1 (L) 09/17/2020   MCV 86.2 09/17/2020   PLT 124 (L) 09/17/2020     Chemistry      Component Value Date/Time   NA 142 09/17/2020 1440   NA 142 01/25/2017 1217   K 3.3 (L) 09/17/2020 1440   K 4.1 01/25/2017 1217   CL 103 09/17/2020 1440   CL 102 01/25/2017 1217   CO2 31 09/17/2020 1440   CO2 33 01/25/2017 1217   BUN 12 09/17/2020 1440   BUN 12 01/25/2017 1217   CREATININE 1.14 09/17/2020 1440   CREATININE 1.4 (H) 01/25/2017 1217      Component Value Date/Time   CALCIUM 9.9 09/17/2020 1440   CALCIUM 9.7 01/25/2017 1217   ALKPHOS 55 09/17/2020 1440   ALKPHOS 59 01/25/2017 1217   AST 23 09/17/2020 1440   ALT 26 09/17/2020 1440   ALT 32 01/25/2017 1217   BILITOT 0.6 09/17/2020 1440      Impression and Plan: Mr. Eves is a 75 year old African-American male.  He was diagnosed with CLL back in [place].  He then had transformation to a large cell non-Hodgkin's lymphoma.  He was treated with chemotherapy followed by radiation therapy to the neck given that he had bulky disease.   He completed his chemotherapy back in December 2020.  He then underwent radiation therapy which he completed I think in early February 2021.  Again, we have to worry about recurrence.  We will have to see about getting a biopsy.  Hopefully we can get a needle biopsy.  If not, then we will have to think about surgically removing one of the cervical lymph nodes.  I probably  would go after one of the cervical lymph nodes.  I just hate this.  He does have other health issues.  He did do well with chemotherapy.  If we find that he has recurrence of his large cell lymphoma, we will have to see about another regimen.  As above another possibility for him might be CAR-T therapy.  This might be a reasonable option.  Again we will see about the biopsy.  We will see if radiology can do this for [place].  We will plan to get him back once I do have a firm diagnosis and then we will decide on treatment options.   Volanda Napoleon, MD 5/18/20227:19 AM

## 2020-10-12 DIAGNOSIS — I2510 Atherosclerotic heart disease of native coronary artery without angina pectoris: Secondary | ICD-10-CM | POA: Diagnosis not present

## 2020-10-12 DIAGNOSIS — N400 Enlarged prostate without lower urinary tract symptoms: Secondary | ICD-10-CM | POA: Diagnosis not present

## 2020-10-12 DIAGNOSIS — B348 Other viral infections of unspecified site: Secondary | ICD-10-CM | POA: Diagnosis not present

## 2020-10-12 DIAGNOSIS — C8331 Diffuse large B-cell lymphoma, lymph nodes of head, face, and neck: Secondary | ICD-10-CM | POA: Diagnosis not present

## 2020-10-12 DIAGNOSIS — E1142 Type 2 diabetes mellitus with diabetic polyneuropathy: Secondary | ICD-10-CM | POA: Diagnosis not present

## 2020-10-12 DIAGNOSIS — I10 Essential (primary) hypertension: Secondary | ICD-10-CM | POA: Diagnosis not present

## 2020-10-12 DIAGNOSIS — G3281 Cerebellar ataxia in diseases classified elsewhere: Secondary | ICD-10-CM | POA: Diagnosis not present

## 2020-10-12 DIAGNOSIS — E1165 Type 2 diabetes mellitus with hyperglycemia: Secondary | ICD-10-CM | POA: Diagnosis not present

## 2020-10-12 DIAGNOSIS — C9110 Chronic lymphocytic leukemia of B-cell type not having achieved remission: Secondary | ICD-10-CM | POA: Diagnosis not present

## 2020-10-14 ENCOUNTER — Telehealth: Payer: Self-pay | Admitting: *Deleted

## 2020-10-14 NOTE — Telephone Encounter (Signed)
Patient called inquiring about biopsy that Dr Marin Olp wanted him to have.  Sent message to schedulers who are working on it now.  Awaiting call from [place].  Patient aware.

## 2020-10-15 ENCOUNTER — Telehealth: Payer: Self-pay | Admitting: *Deleted

## 2020-10-15 DIAGNOSIS — R7301 Impaired fasting glucose: Secondary | ICD-10-CM | POA: Diagnosis not present

## 2020-10-15 DIAGNOSIS — N138 Other obstructive and reflux uropathy: Secondary | ICD-10-CM | POA: Diagnosis not present

## 2020-10-15 DIAGNOSIS — N401 Enlarged prostate with lower urinary tract symptoms: Secondary | ICD-10-CM | POA: Diagnosis not present

## 2020-10-15 NOTE — Telephone Encounter (Signed)
Called twice and lvm to centralized scheduling to schedule a IR US guided biopsy for this patient and contact the patient. Tried to call patient's wife to give an update. No answer.

## 2020-10-16 ENCOUNTER — Telehealth: Payer: Self-pay | Admitting: *Deleted

## 2020-10-16 ENCOUNTER — Encounter (HOSPITAL_COMMUNITY): Payer: Self-pay

## 2020-10-16 DIAGNOSIS — I10 Essential (primary) hypertension: Secondary | ICD-10-CM | POA: Diagnosis not present

## 2020-10-16 DIAGNOSIS — N400 Enlarged prostate without lower urinary tract symptoms: Secondary | ICD-10-CM | POA: Diagnosis not present

## 2020-10-16 DIAGNOSIS — B348 Other viral infections of unspecified site: Secondary | ICD-10-CM | POA: Diagnosis not present

## 2020-10-16 DIAGNOSIS — E1165 Type 2 diabetes mellitus with hyperglycemia: Secondary | ICD-10-CM | POA: Diagnosis not present

## 2020-10-16 DIAGNOSIS — C8331 Diffuse large B-cell lymphoma, lymph nodes of head, face, and neck: Secondary | ICD-10-CM | POA: Diagnosis not present

## 2020-10-16 DIAGNOSIS — C9110 Chronic lymphocytic leukemia of B-cell type not having achieved remission: Secondary | ICD-10-CM | POA: Diagnosis not present

## 2020-10-16 DIAGNOSIS — G3281 Cerebellar ataxia in diseases classified elsewhere: Secondary | ICD-10-CM | POA: Diagnosis not present

## 2020-10-16 DIAGNOSIS — I2510 Atherosclerotic heart disease of native coronary artery without angina pectoris: Secondary | ICD-10-CM | POA: Diagnosis not present

## 2020-10-16 DIAGNOSIS — E1142 Type 2 diabetes mellitus with diabetic polyneuropathy: Secondary | ICD-10-CM | POA: Diagnosis not present

## 2020-10-16 NOTE — Telephone Encounter (Signed)
Patient's wife called to check on the status of getting imaging scheduled. Contacted centralized scheduling to check so I call patient back with an answer.

## 2020-10-16 NOTE — Progress Notes (Signed)
LE    2       Ronald Rice Male, 75 y.o., 05/15/1946  MRN:  [id number] Phone:  [phone] Ronald Rice)       PCP:  Deland Pretty, MD Coverage:  [facility] Medicare/[employer] Medicare Hmo  Next Appt With Radiology ([facility]-US 2) 10/24/2020 at 1:00 PM           RE: Biopsy Received: Yesterday  Message Details  Ronald Cleveland, MD  Ronald Rice Ok   [place] core R cerv LAN  R/o lymphoma   DDH    Previous Messages  ----- Message -----  From: Ronald Rice  Sent: 10/15/2020  3:57 PM EDT  To: Ir Procedure Requests  Subject: Biopsy                      Procedure Requested: ir US guide bx/asp drain    Reason for Procedure: Positive PET scan. History of large cell lymphoma. Is there recurrent disease? We need core biopsy if possible.    Provider Requesting: Volanda Napoleon  Provider Telephone: [phone]    Other Info:

## 2020-10-23 ENCOUNTER — Other Ambulatory Visit: Payer: Self-pay | Admitting: Student

## 2020-10-24 ENCOUNTER — Ambulatory Visit (HOSPITAL_COMMUNITY)
Admission: RE | Admit: 2020-10-24 | Discharge: 2020-10-24 | Disposition: A | Discharge status: Home / Self Care | Payer: Medicare HMO | Source: Ambulatory Visit | Attending: Hematology & Oncology | Admitting: Hematology & Oncology

## 2020-10-24 ENCOUNTER — Encounter (HOSPITAL_COMMUNITY): Payer: Self-pay

## 2020-10-24 ENCOUNTER — Other Ambulatory Visit: Payer: Self-pay

## 2020-10-24 DIAGNOSIS — Z9221 Personal history of antineoplastic chemotherapy: Secondary | ICD-10-CM | POA: Insufficient documentation

## 2020-10-24 DIAGNOSIS — Z7982 Long term (current) use of aspirin: Secondary | ICD-10-CM | POA: Insufficient documentation

## 2020-10-24 DIAGNOSIS — C9110 Chronic lymphocytic leukemia of B-cell type not having achieved remission: Secondary | ICD-10-CM | POA: Insufficient documentation

## 2020-10-24 DIAGNOSIS — Z79899 Other long term (current) drug therapy: Secondary | ICD-10-CM | POA: Insufficient documentation

## 2020-10-24 DIAGNOSIS — Z7984 Long term (current) use of oral hypoglycemic drugs: Secondary | ICD-10-CM | POA: Diagnosis not present

## 2020-10-24 DIAGNOSIS — Z923 Personal history of irradiation: Secondary | ICD-10-CM | POA: Diagnosis not present

## 2020-10-24 DIAGNOSIS — C8338 Diffuse large B-cell lymphoma, lymph nodes of multiple sites: Secondary | ICD-10-CM | POA: Diagnosis not present

## 2020-10-24 DIAGNOSIS — C8301 Small cell B-cell lymphoma, lymph nodes of head, face, and neck: Secondary | ICD-10-CM | POA: Diagnosis not present

## 2020-10-24 DIAGNOSIS — C8330 Diffuse large B-cell lymphoma, unspecified site: Secondary | ICD-10-CM | POA: Diagnosis not present

## 2020-10-24 DIAGNOSIS — R590 Localized enlarged lymph nodes: Secondary | ICD-10-CM | POA: Diagnosis not present

## 2020-10-24 LAB — GLUCOSE, CAPILLARY: Glucose-Capillary: 135 mg/dL — ABNORMAL HIGH (ref 70–99)

## 2020-10-24 MED ORDER — FENTANYL CITRATE (PF) 100 MCG/2ML IJ SOLN
INTRAMUSCULAR | Status: AC | PRN
  Administered 2020-10-24 (×2): 50 ug via INTRAVENOUS

## 2020-10-24 MED ORDER — LIDOCAINE-EPINEPHRINE (PF) 1 %-1:200000 IJ SOLN
INTRAMUSCULAR | Status: AC | PRN
  Administered 2020-10-24: 10 mL via INTRADERMAL

## 2020-10-24 MED ORDER — HEPARIN SOD (PORK) LOCK FLUSH 100 UNIT/ML IV SOLN
500.0000 [IU] | INTRAVENOUS | Status: AC | PRN
  Administered 2020-10-24: 500 [IU]
  Filled 2020-10-24: qty 5

## 2020-10-24 MED ORDER — SODIUM CHLORIDE 0.9 % IV SOLN: INTRAVENOUS | Status: DC

## 2020-10-24 MED ORDER — LIDOCAINE-EPINEPHRINE (PF) 2 %-1:200000 IJ SOLN
INTRAMUSCULAR | Status: AC
  Filled 2020-10-24: qty 20

## 2020-10-24 MED ORDER — MIDAZOLAM HCL 2 MG/2ML IJ SOLN
INTRAMUSCULAR | Status: AC
  Filled 2020-10-24: qty 2

## 2020-10-24 MED ORDER — FENTANYL CITRATE (PF) 100 MCG/2ML IJ SOLN
INTRAMUSCULAR | Status: AC
  Filled 2020-10-24: qty 2

## 2020-10-24 MED ORDER — MIDAZOLAM HCL 2 MG/2ML IJ SOLN
INTRAMUSCULAR | Status: AC | PRN
  Administered 2020-10-24 (×2): 1 mg via INTRAVENOUS

## 2020-10-24 NOTE — Procedures (Signed)
Pre Procedure Dx: Diffuse large B-cell lymphoma Post Procedural Dx: Same  Technically successful US guided biopsy of dominant right cervical lymph node.   EBL: None No immediate complications.   Ronny Bacon, MD Pager #: [phone]

## 2020-10-24 NOTE — Discharge Instructions (Signed)
For questions /concerns may call Interventional Radiology at [phone]  You may remove your dressing and shower tomorrow afternoon      Needle Biopsy, Care After These instructions tell you how to care for yourself after your procedure. Your doctor may also give you more specific instructions. Call your doctor if you have any problems or questions. What can I expect after the procedure? After the procedure, it is common to have:  Soreness.  Bruising.  Mild pain. Follow these instructions at home:  Return to your normal activities as told by your doctor. Ask your doctor what activities are safe for you.  Take over-the-counter and prescription medicines only as told by your doctor.  Wash your hands with soap and water before you change your bandage (dressing). If you cannot use soap and water, use hand sanitizer.  Follow instructions from your doctor about: ? How to take care of your puncture site. ? When and how to change your bandage. ? When to remove your bandage.  Check your puncture site every day for signs of infection. Watch for: ? Redness, swelling, or pain. ? Fluid or blood. ? Pus or a bad smell. ? Warmth.  Do not take baths, swim, or use a hot tub until your doctor approves. Ask your doctor if you may take showers. You may only be allowed to take sponge baths.  Keep all follow-up visits as told by your doctor. This is important.   Contact a doctor if you have:  A fever.  Redness, swelling, or pain at the puncture site, and it lasts longer than a few days.  Fluid, blood, or pus coming from the puncture site.  Warmth coming from the puncture site. Get help right away if:  You have a lot of bleeding from the puncture site. Summary  After the procedure, it is common to have soreness, bruising, or mild pain at the puncture site.  Check your puncture site every day for signs of infection, such as redness, swelling, or pain.  Get help right away if you have  severe bleeding from your puncture site. This information is not intended to replace advice given to you by your health care provider. Make sure you discuss any questions you have with your health care provider.    Moderate Conscious Sedation, Adult, Care After This sheet gives you information about how to care for yourself after your procedure. Your health care provider may also give you more specific instructions. If you have problems or questions, contact your health care provider. What can I expect after the procedure? After the procedure, it is common to have:  Sleepiness for several hours.  Impaired judgment for several hours.  Difficulty with balance.  Vomiting if you eat too soon. Follow these instructions at home: For the time period you were told by your health care provider:  Rest.  Do not participate in activities where you could fall or become injured.  Do not drive or use machinery.  Do not drink alcohol.  Do not take sleeping pills or medicines that cause drowsiness.  Do not make important decisions or sign legal documents.  Do not take care of children on your own.      Eating and drinking  Follow the diet recommended by your health care provider.  Drink enough fluid to keep your urine pale yellow.  If you vomit: ? Drink water, juice, or soup when you can drink without vomiting. ? Make sure you have little or no nausea before eating  solid foods.   General instructions  Take over-the-counter and prescription medicines only as told by your health care provider.  Have a responsible adult stay with you for the time you are told. It is important to have someone help care for you until you are awake and alert.  Do not smoke.  Keep all follow-up visits as told by your health care provider. This is important. Contact a health care provider if:  You are still sleepy or having trouble with balance after 24 hours.  You feel light-headed.  You keep  feeling nauseous or you keep vomiting.  You develop a rash.  You have a fever.  You have redness or swelling around the IV site. Get help right away if:  You have trouble breathing.  You have new-onset confusion at home. Summary  After the procedure, it is common to feel sleepy, have impaired judgment, or feel nauseous if you eat too soon.  Rest after you get home. Know the things you should not do after the procedure.  Follow the diet recommended by your health care provider and drink enough fluid to keep your urine pale yellow.  Get help right away if you have trouble breathing or new-onset confusion at home. This information is not intended to replace advice given to you by your health care provider. Make sure you discuss any questions you have with your health care provider. Document Revised: 09/07/2019 Document Reviewed: 04/05/2019 Elsevier Patient Education  2021 [employer].

## 2020-10-24 NOTE — H&P (Signed)
Referring Physician(s): Ennever,Peter R  Supervising Physician: Sandi Mariscal  Patient Status:  [facility] OP  Chief Complaint:  "I'm having a biopsy"  Subjective: Patient familiar to IR service from left cervical lymph node biopsy in 2020 and Port-A-Cath placement in 2020.  He has a history of CLL with transformation to diffuse large B-cell lymphoma.  He is status post chemoradiation.  PET scan performed on 10/03/2020 revealed:  1. Newly enlarged multifocal lymphadenopathy with mild metabolic activity consistent with lymphoma recurrence ( [place] 5). 2. While the metabolic activity is relatively mild the nodes are clearly increased in size including cervical lymph nodes on the RIGHT, bilateral axillary lymph nodes, mediastinal lymph nodes, periaortic retroperitoneal nodes, and iliac nodes. 3. Spleen and marrow appear normal  He presents today for image guided right cervical lymph node biopsy for further evaluation.  He currently denies fever, headache, chest pain, dyspnea, cough, abdominal/back pain, nausea, vomiting or bleeding.  Additional medical history as below.  Past Medical History:  Diagnosis Date  . Cancer ([place])    Leukemia   . Cervical myelopathy ([place])   . Chronic sinusitis   . CLL (chronic lymphocytic leukemia) ([place])    see records in care everywhere from [place]  . Coronary artery disease    minimal nonobstructive by 10/31/16 cath at [facility] in [place]  . Diabetes mellitus without complication ([place])   . Diffuse large B-cell lymphoma of lymph nodes of multiple regions ([place]) 10/31/2018  . Diverticulosis   . Erectile dysfunction   . Fracture    left ankle fracture with retained hardware  . GERD (gastroesophageal reflux disease)   . Goals of care, counseling/discussion 10/31/2018  . Gout   . Hyperlipemia   . Hypertension   . Lumbar radiculopathy   . Memory loss   . OSA (obstructive sleep apnea)   . Peripheral neuropathy   . Renal cyst   . Sleep apnea     CPAP  .  Thyroid nodule   . Wears dentures   . Wears glasses    Past Surgical History:  Procedure Laterality Date  . ANKLE RECONSTRUCTION Left 12/19/2018   Procedure: LEFT ANKLE REVISION FIXATION, REMOVAL OF HARDWARE, OPEN TREATMENT, SYNDESMOSIS, POSSIBLE ANKLE ARTHROTOMY, DELTOID LIGAMENT REPAIR;  Surgeon: Erle Crocker, MD;  Location: [facility];  Service: Orthopedics;  Laterality: Left;  . ANTERIOR CERVICAL DECOMP/DISCECTOMY FUSION N/A 04/20/2017   Procedure: ANTERIOR CERVICAL DECOMPRESSION FUSION, CERVICAL 3-4, CERVICAL 4-5 WITH INSTRUMENTATION AND ALLOGRAFT; REQUEST 3.5 HOURS;  Surgeon: Phylliss Bob, MD;  Location: [facility];  Service: Orthopedics;  Laterality: N/A;  ANTERIOR CERVICAL DECOMPRESSION FUSION, CERVICAL 3-4, CERVICAL 4-5 WITH INSTRUMENTATION AND ALLOGRAFT; REQUEST 3.5 HOURS  . BACK SURGERY    . CARDIAC CATHETERIZATION     in Care Everywhere 11/01/16  . COLONOSCOPY  10 years ago   in [place]-normal exam  . IR IMAGING GUIDED PORT INSERTION  11/10/2018  . IR RADIOLOGIST EVAL & MGMT  10/16/2019  . IR RADIOLOGIST EVAL & MGMT  12/20/2019  . IR RENAL CYST  11/28/2019  . IR RENAL CYST  11/28/2019  . IR RENAL CYST  11/28/2019  . IR RENAL CYST  11/28/2019  . LEG SURGERY Left 2019   from fracture  . MULTIPLE TOOTH EXTRACTIONS    . ORIF ANKLE FRACTURE Left 09/13/2018   Procedure: OPEN REDUCTION INTERNAL FIXATION (ORIF) LATERAL LEFT ANKLE FRACTURE;  Surgeon: Dorna Leitz, MD;  Location: [facility];  Service: Orthopedics;  Laterality: Left;  . SYNDESMOSIS REPAIR Left 09/13/2018  Procedure: OPEN REDUCTION INTERNAL FIXATION SYNDESMOSIS REPAIR LEFT ANKLE;  Surgeon: Dorna Leitz, MD;  Location: [facility];  Service: Orthopedics;  Laterality: Left;     Allergies: Patient has no known allergies.  Medications: Prior to Admission medications   Medication Sig Start Date End Date Taking? Authorizing Provider  acetaminophen (TYLENOL) 500 MG tablet Take 1,000 mg by mouth every 6 (six) hours as needed (pain).    Yes  [provider]  allopurinol (ZYLOPRIM) 300 MG tablet Take 300 mg by mouth daily.   Yes [provider]  aspirin EC 81 MG tablet Take 81 mg by mouth daily.   Yes [provider]  atenolol (TENORMIN) 25 MG tablet Take 25 mg by mouth daily.    Yes [provider]  atorvastatin (LIPITOR) 40 MG tablet Take 40 mg by mouth daily.   Yes [provider]  chlorthalidone (HYGROTON) 25 MG tablet Take 25 mg by mouth daily with breakfast. 04/09/20  Yes [provider]  Cholecalciferol (VITAMIN D3 PO) Take 1 tablet by mouth daily.   Yes [provider]  CINNAMON PO Take 1 tablet by mouth daily.   Yes [provider]  donepezil (ARICEPT ODT) 5 MG disintegrating tablet Take 5 mg by mouth at bedtime. 03/29/19  Yes [provider]  DULoxetine (CYMBALTA) 60 MG capsule TAKE 1 CAPSULE(60 MG) BY MOUTH DAILY 12/14/19  Yes Ennever, Rudell Cobb, MD  famotidine (PEPCID) 20 MG tablet Take 20 mg by mouth at bedtime as needed for heartburn.   Yes [provider]  lidocaine-prilocaine (EMLA) cream Apply to PAC 1 hour prior to procedure. 01/03/19  Yes Volanda Napoleon, MD  lisinopril (ZESTRIL) 40 MG tablet Take 1 tablet (40 mg total) by mouth daily for 30 days. 10/29/18 12/20/27 Yes Donne Hazel, MD  loratadine (CLARITIN) 10 MG tablet Take 10 mg by mouth as needed.   Yes [provider]  metFORMIN (GLUCOPHAGE-XR) 500 MG 24 hr tablet Take 500 mg by mouth every evening.  11/28/18  Yes [provider]  potassium chloride (K-DUR) 10 MEQ tablet Take 10 mEq by mouth daily.    Yes [provider]  Semaglutide,0.25 or 0.5MG /DOS, (OZEMPIC, 0.25 OR 0.5 MG/DOSE,) 2 MG/1.5ML SOPN 0.25-0.5 mg 07/17/20  Yes [provider]  tamsulosin (FLOMAX) 0.4 MG CAPS capsule Take 0.4 mg by mouth daily.   Yes [provider]  traMADol (ULTRAM) 50 MG tablet TAKE 1 TABLET BY MOUTH EVERY 6 HOURS AS NEEDED Patient taking differently:  Take 50 mg by mouth every 6 (six) hours as needed for moderate pain. 10/25/19  Yes Volanda Napoleon, MD  diclofenac sodium (VOLTAREN) 1 % GEL Apply 1 application topically as needed (pain).  10/10/18   [provider]  ipratropium (ATROVENT) 0.06 % nasal spray Place 1 spray into both nostrils daily as needed for rhinitis.  08/29/19   [provider]  Lancets (ONETOUCH DELICA PLUS [id number]) [place]  09/27/19   [provider]  Na Sulfate-K Sulfate-Mg Sulf 17.5-3.13-1.6 GM/177ML SOLN Suprep (no substitutions)-TAKE AS DIRECTED. 03/26/20   Thornton Park, MD  NON FORMULARY Take 2 tablets by mouth daily. Blood Sugar Defense    [provider]  pyridoxine (B-6) 250 MG tablet Take 250 mg by mouth daily.    [provider]  tadalafil (CIALIS) 20 MG tablet Take 20 mg by mouth as needed. 08/15/20   [provider]  tiZANidine (ZANAFLEX) 4 MG tablet Take 4 mg by mouth 2 (two) times daily  as needed.    [provider]     Vital Signs: Vitals:   10/24/20 1148  BP: 116/82  Pulse: 81  Resp: 18  Temp: 98.3 F (36.8 C)  SpO2: 97%    Ht 6\' 2"  (1.88 m)   Wt 275 lb (124.7 kg)   BMI 35.31 kg/m   Physical Exam awake, alert.  Chest clear to auscultation bilaterally.  Clean, intact right chest wall Port-A-Cath.  Heart with regular rate and rhythm.  Abdomen soft, positive bowel sounds, nontender ;bilateral pretibial edema noted.  Imaging: No results found.  Labs:  CBC: Recent Labs    11/28/19 1144 02/28/20 1208 09/17/20 1440  WBC 3.0* 3.8* 8.9  HGB 12.3* 13.4 13.8  HCT 35.4* 37.2* 38.1*  PLT 139* 141* 124*    COAGS: Recent Labs    11/28/19 1144  INR 1.0    BMP: Recent Labs    02/28/20 1208 09/17/20 1440  NA 141 142  K 3.5 3.3*  CL 102 103  CO2 29 31  GLUCOSE 228* 119*  BUN 11 12  CALCIUM 9.4 9.9  CREATININE 1.11 1.14  GFRNONAA >60 >60    LIVER FUNCTION TESTS: Recent Labs    02/28/20 1208 09/17/20 1440  BILITOT  0.6 0.6  AST 18 23  ALT 24 26  ALKPHOS 59 55  PROT 6.2* 6.9  ALBUMIN 4.3 4.5    Assessment and Plan: Patient familiar to IR service from left cervical lymph node biopsy in 2020 and Port-A-Cath placement in 2020.  He has a history of CLL with transformation to diffuse large B-cell lymphoma.  He is status post chemoradiation.  PET scan performed on 10/03/2020 revealed:  1. Newly enlarged multifocal lymphadenopathy with mild metabolic activity consistent with lymphoma recurrence ( [place] 5). 2. While the metabolic activity is relatively mild the nodes are clearly increased in size including cervical lymph nodes on the RIGHT, bilateral axillary lymph nodes, mediastinal lymph nodes, periaortic retroperitoneal nodes, and iliac nodes. 3. Spleen and marrow appear normal  He presents today for image guided right cervical lymph node biopsy for further evaluation.  Risks and benefits of procedure was discussed with the patient  including, but not limited to bleeding, infection, damage to adjacent structures or low yield requiring additional tests.  All of the questions were answered and there is agreement to proceed.  Consent signed and in chart.     Electronically Signed: D. Rowe Robert, PA-C 10/24/2020, 12:15 PM   I spent a total of 20 minutes at the the patient's bedside AND on the patient's hospital floor or unit, greater than 50% of which was counseling/coordinating care for image guided right cervical lymph node biopsy

## 2020-10-28 LAB — SURGICAL PATHOLOGY

## 2020-10-30 ENCOUNTER — Telehealth: Payer: Self-pay

## 2020-10-30 DIAGNOSIS — H40021 Open angle with borderline findings, high risk, right eye: Secondary | ICD-10-CM | POA: Diagnosis not present

## 2020-10-30 DIAGNOSIS — H25813 Combined forms of age-related cataract, bilateral: Secondary | ICD-10-CM | POA: Diagnosis not present

## 2020-10-30 DIAGNOSIS — E785 Hyperlipidemia, unspecified: Secondary | ICD-10-CM | POA: Diagnosis not present

## 2020-10-30 DIAGNOSIS — H40013 Open angle with borderline findings, low risk, bilateral: Secondary | ICD-10-CM | POA: Diagnosis not present

## 2020-10-30 DIAGNOSIS — I10 Essential (primary) hypertension: Secondary | ICD-10-CM | POA: Diagnosis not present

## 2020-10-30 DIAGNOSIS — E1140 Type 2 diabetes mellitus with diabetic neuropathy, unspecified: Secondary | ICD-10-CM | POA: Diagnosis not present

## 2020-10-30 DIAGNOSIS — L749 Eccrine sweat disorder, unspecified: Secondary | ICD-10-CM | POA: Diagnosis not present

## 2020-10-30 DIAGNOSIS — H16223 Keratoconjunctivitis sicca, not specified as Sjogren's, bilateral: Secondary | ICD-10-CM | POA: Diagnosis not present

## 2020-10-30 DIAGNOSIS — E119 Type 2 diabetes mellitus without complications: Secondary | ICD-10-CM | POA: Diagnosis not present

## 2020-10-31 ENCOUNTER — Encounter: Payer: Self-pay | Admitting: Hematology & Oncology

## 2020-10-31 ENCOUNTER — Telehealth: Payer: Self-pay | Admitting: Pharmacist

## 2020-10-31 ENCOUNTER — Other Ambulatory Visit (HOSPITAL_COMMUNITY): Payer: Self-pay

## 2020-10-31 ENCOUNTER — Inpatient Hospital Stay: Payer: Medicare HMO | Attending: Hematology & Oncology

## 2020-10-31 ENCOUNTER — Inpatient Hospital Stay (HOSPITAL_BASED_OUTPATIENT_CLINIC_OR_DEPARTMENT_OTHER): Payer: Medicare HMO | Admitting: Hematology & Oncology

## 2020-10-31 ENCOUNTER — Telehealth: Payer: Self-pay

## 2020-10-31 ENCOUNTER — Inpatient Hospital Stay: Payer: Medicare HMO

## 2020-10-31 ENCOUNTER — Other Ambulatory Visit: Payer: Self-pay

## 2020-10-31 ENCOUNTER — Telehealth: Payer: Self-pay | Admitting: Pharmacy Technician

## 2020-10-31 VITALS — BP 96/76 | HR 83 | Temp 98.0°F | Resp 16 | Wt 273.0 lb

## 2020-10-31 DIAGNOSIS — Z8572 Personal history of non-Hodgkin lymphomas: Secondary | ICD-10-CM | POA: Diagnosis not present

## 2020-10-31 DIAGNOSIS — Z452 Encounter for adjustment and management of vascular access device: Secondary | ICD-10-CM | POA: Insufficient documentation

## 2020-10-31 DIAGNOSIS — C9110 Chronic lymphocytic leukemia of B-cell type not having achieved remission: Secondary | ICD-10-CM | POA: Diagnosis not present

## 2020-10-31 DIAGNOSIS — C9112 Chronic lymphocytic leukemia of B-cell type in relapse: Secondary | ICD-10-CM | POA: Insufficient documentation

## 2020-10-31 DIAGNOSIS — Z95828 Presence of other vascular implants and grafts: Secondary | ICD-10-CM

## 2020-10-31 DIAGNOSIS — C8338 Diffuse large B-cell lymphoma, lymph nodes of multiple sites: Secondary | ICD-10-CM

## 2020-10-31 LAB — CBC WITH DIFFERENTIAL (CANCER CENTER ONLY)
Abs Immature Granulocytes: 0.02 10*3/uL (ref 0.00–0.07)
Basophils Absolute: 0 10*3/uL (ref 0.0–0.1)
Basophils Relative: 1 %
Eosinophils Absolute: 0 10*3/uL (ref 0.0–0.5)
Eosinophils Relative: 0 %
HCT: 35.6 % — ABNORMAL LOW (ref 39.0–52.0)
Hemoglobin: 12.8 g/dL — ABNORMAL LOW (ref 13.0–17.0)
Immature Granulocytes: 0 %
Lymphocytes Relative: 70 %
Lymphs Abs: 6.2 10*3/uL — ABNORMAL HIGH (ref 0.7–4.0)
MCH: 31.4 pg (ref 26.0–34.0)
MCHC: 36 g/dL (ref 30.0–36.0)
MCV: 87.5 fL (ref 80.0–100.0)
Monocytes Absolute: 0.6 10*3/uL (ref 0.1–1.0)
Monocytes Relative: 7 %
Neutro Abs: 2 10*3/uL (ref 1.7–7.7)
Neutrophils Relative %: 22 %
Platelet Count: 123 10*3/uL — ABNORMAL LOW (ref 150–400)
RBC: 4.07 MIL/uL — ABNORMAL LOW (ref 4.22–5.81)
RDW: 13.3 % (ref 11.5–15.5)
WBC Count: 8.8 10*3/uL (ref 4.0–10.5)
nRBC: 0 % (ref 0.0–0.2)

## 2020-10-31 LAB — CMP (CANCER CENTER ONLY)
ALT: 23 U/L (ref 0–44)
AST: 20 U/L (ref 15–41)
Albumin: 4.4 g/dL (ref 3.5–5.0)
Alkaline Phosphatase: 61 U/L (ref 38–126)
Anion gap: 9 (ref 5–15)
BUN: 15 mg/dL (ref 8–23)
CO2: 29 mmol/L (ref 22–32)
Calcium: 9.8 mg/dL (ref 8.9–10.3)
Chloride: 105 mmol/L (ref 98–111)
Creatinine: 1.22 mg/dL (ref 0.61–1.24)
GFR, Estimated: >60 mL/min (ref >60)
Glucose, Bld: 151 mg/dL — ABNORMAL HIGH (ref 70–99)
Potassium: 3.6 mmol/L (ref 3.5–5.1)
Sodium: 143 mmol/L (ref 135–145)
Total Bilirubin: 0.6 mg/dL (ref 0.3–1.2)
Total Protein: 6.3 g/dL — ABNORMAL LOW (ref 6.5–8.1)

## 2020-10-31 LAB — URIC ACID: Uric Acid, Serum: 5.8 mg/dL (ref 3.7–8.6)

## 2020-10-31 LAB — LACTATE DEHYDROGENASE: LDH: 176 U/L (ref 98–192)

## 2020-10-31 MED ORDER — SODIUM CHLORIDE 0.9% FLUSH
10.0000 mL | Freq: Once | INTRAVENOUS | Status: AC
  Administered 2020-10-31: 10 mL via INTRAVENOUS
  Filled 2020-10-31: qty 10

## 2020-10-31 MED ORDER — HEPARIN SOD (PORK) LOCK FLUSH 100 UNIT/ML IV SOLN
500.0000 [IU] | Freq: Once | INTRAVENOUS | Status: AC
Start: 2020-10-31 — End: 2020-10-31
  Administered 2020-10-31: 500 [IU] via INTRAVENOUS
  Filled 2020-10-31: qty 5

## 2020-10-31 MED ORDER — CALQUENCE 100 MG PO CAPS
100.0000 mg | ORAL_CAPSULE | Freq: Two times a day (BID) | ORAL | 6 refills | Status: DC
Start: 2020-10-31 — End: 2021-10-12
  Filled 2020-10-31: qty 60, 30d supply, fill #0

## 2020-10-31 NOTE — Telephone Encounter (Signed)
Oral Oncology Patient Advocate Encounter  Prior Authorization for Calquence has been approved.    PA# [id number] Effective dates: 10/31/20 through 05/23/21  Patients co-pay is $2688.20.  Oral Oncology Clinic will continue to follow.   Ronald Rice Patient [place] Phone [phone] Fax [phone] 10/31/2020 3:49 PM

## 2020-10-31 NOTE — Progress Notes (Signed)
Hematology and Oncology Follow Up Visit  Ronald Rice [id number] [birth year] 75 y.o. 10/31/2020   Principle Diagnosis:  Diffuse large cell non-Hodgkin's lymphoma-Richter's transformation from CLL CLL recurrence  Current Therapy:   R-CHOP-s/p cycle #8-- started on 11/08/2018 XRT to the LEFT neck -- 4500 rad -- completed on 08/01/2019 Acalabrutinib 100 mg po BID -- start on 11/04/2020     Interim History:  Ronald Rice is back for follow-up.  We do would go ahead and get a biopsy of a lymph node.  This was performed on a lymph node on his right neck.  This was done on 10/24/2020.  The pathology report ([id number]) showed chronic lymphocytic leukemia.  I am happy about this.  Ronald Rice initially had CLL.  I believe Ronald Rice was in [place] when Ronald Rice was treated for this.  We are not sure what kind of treatment Ronald Rice had.  Ronald Rice then had presentation with a large cell lymphoma which I thought was transformation from the CLL.  We successfully treated him for this.  Ronald Rice then had a recurrence of the CLL.  I really think that we can go ahead and treat him with acalabrutinib and venetoclax.  I think this would clearly be a reasonable option for him.  I realize that Ronald Rice does have a lot of medications.  Ronald Rice does have quite a few health issues.  I talked to him about this.  Ronald Rice had his wife on the mobile phone listening.  I believe that acalabrutinib would be tolerable for him.  I do not think it would cause cardiac issues.  Ronald Rice actually does look fairly good right now.  Ronald Rice does have the leg issues.  Ronald Rice has had problems with his legs in the past.  Has had surgery.  Ronald Rice has not noted any issues with respect to swollen lymph nodes or more palpable lymph nodes.  Currently, his performance status is ECOG 1.    Medications:  Current Outpatient Medications:    acalabrutinib (CALQUENCE) 100 MG capsule, Take 1 capsule (100 mg total) by mouth 2 (two) times daily., Disp: 60 capsule, Rfl: 6   pregabalin (LYRICA) 75 MG  capsule, Take 75 mg by mouth 2 (two) times daily., Disp: , Rfl:    acetaminophen (TYLENOL) 500 MG tablet, Take 1,000 mg by mouth every 6 (six) hours as needed (pain). , Disp: , Rfl:    allopurinol (ZYLOPRIM) 300 MG tablet, Take 300 mg by mouth daily., Disp: , Rfl:    aspirin EC 81 MG tablet, Take 81 mg by mouth daily., Disp: , Rfl:    atenolol (TENORMIN) 25 MG tablet, Take 25 mg by mouth daily. , Disp: , Rfl:    atorvastatin (LIPITOR) 40 MG tablet, Take 40 mg by mouth daily., Disp: , Rfl:    chlorthalidone (HYGROTON) 25 MG tablet, Take 25 mg by mouth daily with breakfast., Disp: , Rfl:    Cholecalciferol (VITAMIN D3 PO), Take 1 tablet by mouth daily., Disp: , Rfl:    CINNAMON PO, Take 1 tablet by mouth daily., Disp: , Rfl:    diclofenac sodium (VOLTAREN) 1 % GEL, Apply 1 application topically as needed (pain). , Disp: , Rfl:    donepezil (ARICEPT ODT) 5 MG disintegrating tablet, Take 5 mg by mouth at bedtime., Disp: , Rfl:    DULoxetine (CYMBALTA) 60 MG capsule, TAKE 1 CAPSULE(60 MG) BY MOUTH DAILY, Disp: 90 capsule, Rfl: 3   famotidine (PEPCID) 20 MG tablet, Take 20 mg by mouth at bedtime as needed for heartburn., Disp: ,  Rfl:    ipratropium (ATROVENT) 0.06 % nasal spray, Place 1 spray into both nostrils daily as needed for rhinitis. , Disp: , Rfl:    Lancets (ONETOUCH DELICA PLUS CHEKBT24E) MISC, , Disp: , Rfl:    lidocaine-prilocaine (EMLA) cream, Apply to PAC 1 hour prior to procedure., Disp: 30 g, Rfl: 3   lisinopril (ZESTRIL) 40 MG tablet, Take 1 tablet (40 mg total) by mouth daily for 30 days., Disp: 30 tablet, Rfl: 0   loratadine (CLARITIN) 10 MG tablet, Take 10 mg by mouth as needed., Disp: , Rfl:    metFORMIN (GLUCOPHAGE-XR) 500 MG 24 hr tablet, Take 500 mg by mouth every evening. , Disp: , Rfl:    Na Sulfate-K Sulfate-Mg Sulf 17.5-3.13-1.6 GM/177ML SOLN, Suprep (no substitutions)-TAKE AS DIRECTED., Disp: 354 mL, Rfl: 0   NON FORMULARY, Take 2 tablets by mouth daily. Blood Sugar  Defense, Disp: , Rfl:    potassium chloride (K-DUR) 10 MEQ tablet, Take 10 mEq by mouth daily. , Disp: , Rfl:    pyridoxine (B-6) 250 MG tablet, Take 250 mg by mouth daily., Disp: , Rfl:    Semaglutide,0.25 or 0.5MG /DOS, (OZEMPIC, 0.25 OR 0.5 MG/DOSE,) 2 MG/1.5ML SOPN, 0.25-0.5 mg, Disp: , Rfl:    tadalafil (CIALIS) 20 MG tablet, Take 20 mg by mouth as needed., Disp: , Rfl:    tamsulosin (FLOMAX) 0.4 MG CAPS capsule, Take 0.4 mg by mouth daily., Disp: , Rfl:    tiZANidine (ZANAFLEX) 4 MG tablet, Take 4 mg by mouth 2 (two) times daily as needed., Disp: , Rfl:    traMADol (ULTRAM) 50 MG tablet, TAKE 1 TABLET BY MOUTH EVERY 6 HOURS AS NEEDED (Patient taking differently: Take 50 mg by mouth every 6 (six) hours as needed for moderate pain.), Disp: 90 tablet, Rfl: 0  Allergies:  No Known Allergies  Past Medical History, Surgical history, Social history, and Family History were reviewed and updated.  Review of Systems: Review of Systems  Constitutional: Negative.   HENT:  Negative.    Eyes: Negative.   Respiratory: Negative.    Cardiovascular: Negative.   Gastrointestinal: Negative.   Endocrine: Negative.   Genitourinary:  Positive for nocturia.   Musculoskeletal:  Positive for arthralgias, gait problem and myalgias.  Neurological:  Positive for gait problem.  Hematological: Negative.   Psychiatric/Behavioral: Negative.     Physical Exam:  weight is 273 lb (123.8 kg). His oral temperature is 98 F (36.7 C). His blood pressure is 96/76 and his pulse is 83. His respiration is 16 and oxygen saturation is 99%.   Wt Readings from Last 3 Encounters:  10/31/20 273 lb (123.8 kg)  10/24/20 275 lb (124.7 kg)  10/07/20 276 lb 1.9 oz (125.2 kg)    Physical Exam Vitals reviewed.  HENT:     Head: Normocephalic and atraumatic.  Eyes:     Pupils: Pupils are equal, round, and reactive to light.  Neck:     Comments: Examination of his neck shows no obvious palpable adenopathy.  Ronald Rice has some  changes from radiation on the left side of the neck.  I cannot palpate any supraclavicular lymph nodes.   Cardiovascular:     Rate and Rhythm: Normal rate and regular rhythm.     Heart sounds: Normal heart sounds.  Pulmonary:     Effort: Pulmonary effort is normal.     Breath sounds: Normal breath sounds.  Abdominal:     General: Bowel sounds are normal.     Palpations: Abdomen is  soft.  Musculoskeletal:        General: No tenderness or deformity. Normal range of motion.  Lymphadenopathy:     Cervical: No cervical adenopathy.  Skin:    General: Skin is warm and dry.     Findings: No erythema or rash.  Neurological:     Mental Status: Ronald Rice is alert and oriented to person, place, and time.  Psychiatric:        Behavior: Behavior normal.        Thought Content: Thought content normal.        Judgment: Judgment normal.   Lab Results  Component Value Date   WBC 8.8 10/31/2020   HGB 12.8 (L) 10/31/2020   HCT 35.6 (L) 10/31/2020   MCV 87.5 10/31/2020   PLT 123 (L) 10/31/2020     Chemistry      Component Value Date/Time   NA 143 10/31/2020 0957   NA 142 01/25/2017 1217   K 3.6 10/31/2020 0957   K 4.1 01/25/2017 1217   CL 105 10/31/2020 0957   CL 102 01/25/2017 1217   CO2 29 10/31/2020 0957   CO2 33 01/25/2017 1217   BUN 15 10/31/2020 0957   BUN 12 01/25/2017 1217   CREATININE 1.22 10/31/2020 0957   CREATININE 1.4 (H) 01/25/2017 1217      Component Value Date/Time   CALCIUM 9.8 10/31/2020 0957   CALCIUM 9.7 01/25/2017 1217   ALKPHOS 61 10/31/2020 0957   ALKPHOS 59 01/25/2017 1217   AST 20 10/31/2020 0957   ALT 23 10/31/2020 0957   ALT 32 01/25/2017 1217   BILITOT 0.6 10/31/2020 0957      Impression and Plan: Ronald Rice is a 75 year old African-American male.  Ronald Rice was diagnosed with CLL back in [place].  Ronald Rice then had transformation to a large cell non-Hodgkin's lymphoma.  Ronald Rice was treated with chemotherapy followed by radiation therapy to the neck given that Ronald Rice had  bulky disease.   Ronald Rice completed his chemotherapy back in December 2020.  Ronald Rice then underwent radiation therapy which Ronald Rice completed I think in early February 2021.  Again, looks like it is the CLL that is come back.  We will start him off with acalabrutinib.  We will use 100 mg p.o. twice daily dosing.  I would do this for 2 or 3 months and then we would add venetoclax.  I think this would be a very reasonable combination for him.  I went over the side effects of the acalabrutinib.  Again I think this would be tolerable for him.  Ronald Rice is in agreement to get started.  I will send the prescription in and then are pharmacists who handle are oral agents will be able to get everything settled.  I would like to see him back in about 3 weeks or so.  We will see how Ronald Rice is doing and how well things are being tolerated with the acalabrutinib.    Volanda Napoleon, MD 6/10/20222:39 PM

## 2020-10-31 NOTE — Telephone Encounter (Signed)
Oral Oncology Patient Advocate Encounter   Received notification from [facility] that prior authorization for Calquence is required.   PA submitted on CoverMyMeds Key [id number] Status is pending   Oral Oncology Clinic will continue to follow.  [place] Patient [place] Phone [phone] Fax [phone] 10/31/2020 3:48 PM

## 2020-10-31 NOTE — Patient Instructions (Signed)
Tunneled Central Venous Catheter Flushing Guide It is important to flush your tunneled central venous catheter each time you use it, both before and after you use it. Flushing your catheter will help prevent it from clogging. What are the risks? Risks may include: Infection. Air getting into the catheter and bloodstream. Supplies needed: A clean pair of gloves. A disinfecting wipe. Use an alcohol wipe, chlorhexidine wipe, or iodine wipe as told by your health care provider. A 10 mL syringe that has been prefilled with saline solution. An empty 10 mL syringe, if a substance called heparin was injected into your catheter. How to flush your catheter When you flush your catheter, make sure you follow any specific instructions from your health care provider or the manufacturer. These are general guidelines. Flushing your catheter before use If there is heparin in your catheter: Wash your hands with soap and water. Put on gloves. Scrub the injection cap for a minimum of 15 seconds with a disinfecting wipe. Unclamp the catheter. Attach the empty syringe to the injection cap. Pull the syringe plunger back and withdraw 10 mL of blood. Place the syringe into an appropriate waste container. Scrub the injection cap for 15 seconds with a disinfecting wipe. Attach the prefilled syringe to the injection cap. Flush the catheter by pushing the plunger forward until all the liquid from the syringe is in the catheter. Remove the syringe from the injection cap. Clamp the catheter. If there is no heparin in your catheter: Wash your hands with soap and water. Put on gloves. Scrub the injection cap for 15 seconds with a disinfecting wipe. Unclamp the catheter. Attach the prefilled syringe to the injection cap. Flush the catheter by pushing the plunger forward until 5 mL of the liquid from the syringe is in the catheter. Pull back on the syringe until you see blood in the catheter. If you have been asked  to collect any blood, follow your health care provider's instructions. Otherwise, flush the catheter with the rest of the solution from the syringe. Remove the syringe from the injection cap. Clamp the catheter.  Flushing your catheter after use Wash your hands with soap and water. Put on gloves. Scrub the injection cap for 15 seconds with a disinfecting wipe. Unclamp the catheter. Attach the prefilled syringe to the injection cap. Flush the catheter by pushing the plunger forward until all of the liquid from the syringe is in the catheter. Remove the syringe from the injection cap. Clamp the catheter. Problems and solutions If blood cannot be completely cleared from the injection cap, you may need to have the injection cap replaced. If the catheter is difficult to flush, use the pulsing method. The pulsing method involves pushing only a few milliliters of solution into the catheter at a time and pausing between pushes. If you do not see blood in the catheter when you pull back on the syringe, change your body position, such as by raising your arms above your head. Take a deep breath and cough. Then, pull back on the syringe. If you still do not see blood, flush the catheter with a small amount of solution. Then, change positions again and take a breath or cough. Pull back on the syringe again. If you still do not see blood, finish flushing the catheter and contact your health care provider. Do not use your catheter until your health care provider says it is okay. General tips Have someone help you flush your catheter, if possible. Do not force fluid   through your catheter. Do not use a syringe that is larger or smaller than 10 mL. Using a smaller syringe can make the catheter burst. Do not use your catheter without flushing it first if it has heparin in it. Contact a health care provider if: You cannot see any blood in the catheter when you flush it before using it. Your catheter is difficult  to flush. Get help right away if: You cannot flush the catheter. The catheter leaks when you flush it or when there is fluid in it. There are cracks or breaks in the catheter. Summary It is important to flush your tunneled central venous catheter each time you use it, both before and after you use it. Scrub the injection cap for 15 seconds with a disinfecting wipe before and after you flush it. When you flush your catheter, make sure you follow any specific instructions from your health care provider or the manufacturer. Get help right away if you cannot flush the catheter. This information is not intended to replace advice given to you by your health care provider. Make sure you discuss any questions you have with your health care provider. Document Revised: 07/19/2019 Document Reviewed: 07/26/2018 Elsevier Patient Education  2022 Elsevier Inc.  

## 2020-10-31 NOTE — Telephone Encounter (Signed)
Oral Oncology Pharmacist Encounter  Received new prescription for Calquence (acalabrutinib) for the treatment of recurrent CLL, planned duration until disease progression or unacceptable drug toxicity.  Prescription dose and frequency assessed for appropriateness. Appropriate for therapy initiation.   CBC w/ Diff and CMP from 10/31/20 assessed, noted pltc 123 K/uL - pt will need to be monitored closely for thrombocytopenia. No adjustments required at this time. Patient's uric acid is WNL and LDH WNL as well.   Current medication list in Epic reviewed, DDIs with Calquence identified: Category D DDI between Calquence and Famotidine - H2RAs can decrease serum concentrations of Calquence. Will counsel patient to administer Calquence 2 hours before famotidine.  Category C DDI between Calquence and Aspirin as well as Duloxetine due to Calquence possibly enhancing antiplatelet properties of these agents. Will counsel patient to monitor for s/sx of bleeding including but not limited to increased bruising or bleeding that is hard to stop.   Evaluated chart and no patient barriers to medication adherence noted.   Patient agreement for treatment documented in MD note on 10/31/20.  Prescription has been e-scribed to the [facility] for benefits analysis and approval.  Oral Oncology Clinic will continue to follow for insurance authorization, copayment issues, initial counseling and start date.  Leron Croak, PharmD, BCPS Hematology/Oncology Clinical Pharmacist [facility] [phone] 10/31/2020 3:34 PM

## 2020-10-31 NOTE — Telephone Encounter (Signed)
Appts made and printed per 10/31/20 los   [employer]

## 2020-11-01 ENCOUNTER — Other Ambulatory Visit (HOSPITAL_COMMUNITY): Payer: Self-pay

## 2020-11-03 ENCOUNTER — Encounter: Payer: Self-pay | Admitting: Hematology & Oncology

## 2020-11-03 ENCOUNTER — Other Ambulatory Visit (HOSPITAL_COMMUNITY): Payer: Self-pay

## 2020-11-03 DIAGNOSIS — N138 Other obstructive and reflux uropathy: Secondary | ICD-10-CM | POA: Diagnosis not present

## 2020-11-03 DIAGNOSIS — N401 Enlarged prostate with lower urinary tract symptoms: Secondary | ICD-10-CM | POA: Diagnosis not present

## 2020-11-04 NOTE — Telephone Encounter (Signed)
Oral Chemotherapy Pharmacist Encounter  I spoke with patient's wife, Essie Gehret for overview of: Calquence (acalabrutinib) for the treatment of recurrent CLL, planned duration until disease progression or unacceptable toxicity.   Counseled on administration, dosing, side effects, monitoring, drug-food interactions, safe handling, storage, and disposal.  Patient will take Calquence 100mg  capsules, 1 capsule by mouth approximately 12 hours apart, with or with out food, with a glass of water.  Ms. Bowring knows patient is to avoid acid suppressing agents, if needed, may seperate use of an H2RA blocker or antacid by 2 hours from Calquence admisinstration. Wife states that patient only takes famotide PRN currently, but will ensure Calquence is spaced out appropriately (either 2 hours before famotidine or 10 hours after famotidine) when Mr. Cutbirth does take Calquence.  Calquence start date: 11/04/20  Adverse effects include but are not limited to: headache, diarrhea, fatigue, rash, muscle pain, bruising, decreased blood counts, and altered cardiac conduction.    Reviewed importance of keeping a medication schedule and plan for any missed doses. No barriers to medication adherence identified.  Medication reconciliation performed and medication/allergy list updated.  Insurance authorization for [employer] has been obtained. Test claim at the pharmacy revealed copayment $2688.20 for 1st fill of Calquence. Bethena Roys was able to obtain patient a 30-day free trial card for Calquence while awaiting manufacturer assistance determination.  This will ship from the [place] outpatient pharmacy to deliver to patient's home on 11/04/20.  All questions answered.  Ms. Bushnell voiced understanding and appreciation.   Medication education handout placed in mail for patient. Patient and wife knows to call the office with questions or concerns. Oral Chemotherapy Clinic phone number provided to patient.   Leron Croak, PharmD, BCPS Hematology/Oncology Clinical Pharmacist [facility] [phone] 11/04/2020 10:57 AM

## 2020-11-06 ENCOUNTER — Other Ambulatory Visit (HOSPITAL_COMMUNITY): Payer: Self-pay

## 2020-11-06 DIAGNOSIS — B348 Other viral infections of unspecified site: Secondary | ICD-10-CM | POA: Diagnosis not present

## 2020-11-06 DIAGNOSIS — C9110 Chronic lymphocytic leukemia of B-cell type not having achieved remission: Secondary | ICD-10-CM | POA: Diagnosis not present

## 2020-11-06 DIAGNOSIS — E1165 Type 2 diabetes mellitus with hyperglycemia: Secondary | ICD-10-CM | POA: Diagnosis not present

## 2020-11-06 DIAGNOSIS — C8331 Diffuse large B-cell lymphoma, lymph nodes of head, face, and neck: Secondary | ICD-10-CM | POA: Diagnosis not present

## 2020-11-06 NOTE — Telephone Encounter (Signed)
Oral Oncology Patient Advocate Encounter  I spoke with Ronald Rice on 11/03/20 to set up a one time fill of Calquence (30 day free sample).  Address verified for shipment.  Calquence was filled through [facility] and mailed 11/03/20 for delivery 11/04/20.   Emailed patients wife application for assistance.  She will fax forms back to me when completed.  [place] Patient [place] Phone [phone] Fax [phone] 11/06/2020 12:19 PM

## 2020-11-12 ENCOUNTER — Inpatient Hospital Stay: Payer: Medicare HMO

## 2020-11-12 ENCOUNTER — Ambulatory Visit: Payer: Medicare HMO | Admitting: Hematology & Oncology

## 2020-11-12 ENCOUNTER — Other Ambulatory Visit: Payer: Medicare HMO

## 2020-11-13 DIAGNOSIS — C8331 Diffuse large B-cell lymphoma, lymph nodes of head, face, and neck: Secondary | ICD-10-CM | POA: Diagnosis not present

## 2020-11-13 DIAGNOSIS — B348 Other viral infections of unspecified site: Secondary | ICD-10-CM | POA: Diagnosis not present

## 2020-11-13 DIAGNOSIS — C9110 Chronic lymphocytic leukemia of B-cell type not having achieved remission: Secondary | ICD-10-CM | POA: Diagnosis not present

## 2020-11-13 DIAGNOSIS — E1165 Type 2 diabetes mellitus with hyperglycemia: Secondary | ICD-10-CM | POA: Diagnosis not present

## 2020-11-25 ENCOUNTER — Other Ambulatory Visit (HOSPITAL_COMMUNITY): Payer: Self-pay

## 2020-11-25 NOTE — Telephone Encounter (Addendum)
Received completed portion of application from patient in the mail today.  Pending physician signature and then will fax to [employer].  [place] Patient [place] Phone [phone] Fax [phone] 11/25/2020 3:02 PM

## 2020-11-28 DIAGNOSIS — G4733 Obstructive sleep apnea (adult) (pediatric): Secondary | ICD-10-CM | POA: Diagnosis not present

## 2020-11-28 DIAGNOSIS — G4737 Central sleep apnea in conditions classified elsewhere: Secondary | ICD-10-CM | POA: Diagnosis not present

## 2020-12-01 ENCOUNTER — Inpatient Hospital Stay: Payer: Medicare HMO | Attending: Hematology & Oncology

## 2020-12-01 ENCOUNTER — Encounter: Payer: Self-pay | Admitting: Hematology & Oncology

## 2020-12-01 ENCOUNTER — Other Ambulatory Visit (HOSPITAL_COMMUNITY): Payer: Self-pay

## 2020-12-01 ENCOUNTER — Inpatient Hospital Stay: Payer: Medicare HMO | Admitting: Hematology & Oncology

## 2020-12-01 ENCOUNTER — Other Ambulatory Visit: Payer: Self-pay

## 2020-12-01 ENCOUNTER — Inpatient Hospital Stay: Payer: Medicare HMO

## 2020-12-01 ENCOUNTER — Telehealth: Payer: Self-pay

## 2020-12-01 VITALS — BP 139/77 | HR 98 | Temp 97.9°F | Resp 18 | Wt 271.0 lb

## 2020-12-01 DIAGNOSIS — E119 Type 2 diabetes mellitus without complications: Secondary | ICD-10-CM | POA: Diagnosis not present

## 2020-12-01 DIAGNOSIS — C9112 Chronic lymphocytic leukemia of B-cell type in relapse: Secondary | ICD-10-CM | POA: Diagnosis not present

## 2020-12-01 DIAGNOSIS — Z8572 Personal history of non-Hodgkin lymphomas: Secondary | ICD-10-CM | POA: Insufficient documentation

## 2020-12-01 DIAGNOSIS — C9110 Chronic lymphocytic leukemia of B-cell type not having achieved remission: Secondary | ICD-10-CM | POA: Diagnosis not present

## 2020-12-01 LAB — CMP (CANCER CENTER ONLY)
ALT: 16 U/L (ref 0–44)
AST: 15 U/L (ref 15–41)
Albumin: 4.5 g/dL (ref 3.5–5.0)
Alkaline Phosphatase: 67 U/L (ref 38–126)
Anion gap: 10 (ref 5–15)
BUN: 12 mg/dL (ref 8–23)
CO2: 26 mmol/L (ref 22–32)
Calcium: 9.8 mg/dL (ref 8.9–10.3)
Chloride: 104 mmol/L (ref 98–111)
Creatinine: 1.19 mg/dL (ref 0.61–1.24)
GFR, Estimated: >60 mL/min (ref >60)
Glucose, Bld: 166 mg/dL — ABNORMAL HIGH (ref 70–99)
Potassium: 3.5 mmol/L (ref 3.5–5.1)
Sodium: 140 mmol/L (ref 135–145)
Total Bilirubin: 0.6 mg/dL (ref 0.3–1.2)
Total Protein: 6.6 g/dL (ref 6.5–8.1)

## 2020-12-01 LAB — CBC WITH DIFFERENTIAL (CANCER CENTER ONLY)
Abs Immature Granulocytes: 0.06 10*3/uL (ref 0.00–0.07)
Basophils Absolute: 0.1 10*3/uL (ref 0.0–0.1)
Basophils Relative: 0 %
Eosinophils Absolute: 0 10*3/uL (ref 0.0–0.5)
Eosinophils Relative: 0 %
HCT: 37.7 % — ABNORMAL LOW (ref 39.0–52.0)
Hemoglobin: 13.3 g/dL (ref 13.0–17.0)
Immature Granulocytes: 0 %
Lymphocytes Relative: 89 %
Lymphs Abs: 29.5 10*3/uL — ABNORMAL HIGH (ref 0.7–4.0)
MCH: 31.2 pg (ref 26.0–34.0)
MCHC: 35.3 g/dL (ref 30.0–36.0)
MCV: 88.5 fL (ref 80.0–100.0)
Monocytes Absolute: 0.7 10*3/uL (ref 0.1–1.0)
Monocytes Relative: 2 %
Neutro Abs: 3.1 10*3/uL (ref 1.7–7.7)
Neutrophils Relative %: 9 %
Platelet Count: 166 10*3/uL (ref 150–400)
RBC: 4.26 MIL/uL (ref 4.22–5.81)
RDW: 13.2 % (ref 11.5–15.5)
WBC Count: 33.5 10*3/uL — ABNORMAL HIGH (ref 4.0–10.5)
nRBC: 0 % (ref 0.0–0.2)

## 2020-12-01 LAB — SAVE SMEAR(SSMR), FOR PROVIDER SLIDE REVIEW

## 2020-12-01 LAB — LACTATE DEHYDROGENASE: LDH: 150 U/L (ref 98–192)

## 2020-12-01 NOTE — Progress Notes (Signed)
Hematology and Oncology Follow Up Visit  Ronald Rice [id number] Jan 13, 1946 75 y.o. 12/01/2020   Principle Diagnosis:  Diffuse large cell non-Hodgkin's lymphoma-Richter's transformation from CLL CLL recurrence  Current Therapy:   R-CHOP-s/p cycle #8-- started on 11/08/2018 XRT to the LEFT neck -- 4500 rad -- completed on 08/01/2019 Acalabrutinib 100 mg po BID -- start on 11/04/2020     Interim History:  Ronald Rice is back for follow-up.  So far, Ronald Rice is doing okay.  Ronald Rice has been on acalabrutinib for about 3 weeks.  It is no surprise that the white cell count is higher.  We can typically see this with all TKI therapy initially.  Has not noted any swollen lymph nodes.  Ronald Rice has had no fever.  There is been no bleeding.  Ronald Rice has had some diarrhea.  Ronald Rice has had no obvious nausea or vomiting.  Ronald Rice does have some memory difficulties.  I will think there is anything related to his treatments.  This is being going on for a while.  Ronald Rice has had no cough.  Thing his main problem continues to be diabetes.  His blood sugars just are not all that well controlled.  Currently, I would say his performance status is probably ECOG 2.    Medications:  Current Outpatient Medications:    acalabrutinib (CALQUENCE) 100 MG capsule, Take 1 capsule (100 mg total) by mouth 2 (two) times daily., Disp: 60 capsule, Rfl: 6   acetaminophen (TYLENOL) 500 MG tablet, Take 1,000 mg by mouth every 6 (six) hours as needed (pain). , Disp: , Rfl:    allopurinol (ZYLOPRIM) 300 MG tablet, Take 300 mg by mouth daily., Disp: , Rfl:    aspirin EC 81 MG tablet, Take 81 mg by mouth daily., Disp: , Rfl:    atenolol (TENORMIN) 25 MG tablet, Take 25 mg by mouth daily. , Disp: , Rfl:    atorvastatin (LIPITOR) 40 MG tablet, Take 40 mg by mouth daily., Disp: , Rfl:    chlorthalidone (HYGROTON) 25 MG tablet, Take 25 mg by mouth daily with breakfast., Disp: , Rfl:    Cholecalciferol (VITAMIN D3 PO), Take 1 tablet by mouth daily., Disp: , Rfl:     CINNAMON PO, Take 1 tablet by mouth daily., Disp: , Rfl:    diclofenac sodium (VOLTAREN) 1 % GEL, Apply 1 application topically as needed (pain). , Disp: , Rfl:    donepezil (ARICEPT ODT) 5 MG disintegrating tablet, Take 5 mg by mouth at bedtime., Disp: , Rfl:    Lancets (ONETOUCH DELICA PLUS [id number]) MISC, , Disp: , Rfl:    lidocaine-prilocaine (EMLA) cream, Apply to PAC 1 hour prior to procedure., Disp: 30 g, Rfl: 3   lisinopril (ZESTRIL) 40 MG tablet, Take 1 tablet (40 mg total) by mouth daily for 30 days., Disp: 30 tablet, Rfl: 0   metFORMIN (GLUCOPHAGE-XR) 500 MG 24 hr tablet, Take 500 mg by mouth every evening. , Disp: , Rfl:    potassium chloride (K-DUR) 10 MEQ tablet, Take 10 mEq by mouth daily. , Disp: , Rfl:    pregabalin (LYRICA) 75 MG capsule, Take 75 mg by mouth 2 (two) times daily., Disp: , Rfl:    pyridoxine (B-6) 250 MG tablet, Take 250 mg by mouth daily., Disp: , Rfl:    Semaglutide,0.25 or 0.5MG /DOS, (OZEMPIC, 0.25 OR 0.5 MG/DOSE,) 2 MG/1.5ML SOPN, 12/01/2020 0.25., Disp: , Rfl:    tadalafil (CIALIS) 20 MG tablet, Take 20 mg by mouth as needed., Disp: , Rfl:  tamsulosin (FLOMAX) 0.4 MG CAPS capsule, Take 0.4 mg by mouth daily., Disp: , Rfl:    traMADol (ULTRAM) 50 MG tablet, TAKE 1 TABLET BY MOUTH EVERY 6 HOURS AS NEEDED (Patient taking differently: Take 50 mg by mouth every 6 (six) hours as needed for moderate pain.), Disp: 90 tablet, Rfl: 0   DULoxetine (CYMBALTA) 60 MG capsule, TAKE 1 CAPSULE(60 MG) BY MOUTH DAILY (Patient not taking: Reported on 12/01/2020), Disp: 90 capsule, Rfl: 3   famotidine (PEPCID) 20 MG tablet, Take 20 mg by mouth at bedtime as needed for heartburn. (Patient not taking: Reported on 12/01/2020), Disp: , Rfl:    ipratropium (ATROVENT) 0.06 % nasal spray, Place 1 spray into both nostrils daily as needed for rhinitis.  (Patient not taking: Reported on 12/01/2020), Disp: , Rfl:    loratadine (CLARITIN) 10 MG tablet, Take 10 mg by mouth as needed. (Patient  not taking: Reported on 12/01/2020), Disp: , Rfl:    Na Sulfate-K Sulfate-Mg Sulf 17.5-3.13-1.6 GM/177ML SOLN, Suprep (no substitutions)-TAKE AS DIRECTED. (Patient not taking: Reported on 12/01/2020), Disp: 354 mL, Rfl: 0   tiZANidine (ZANAFLEX) 4 MG tablet, Take 4 mg by mouth 2 (two) times daily as needed. (Patient not taking: Reported on 12/01/2020), Disp: , Rfl:   Allergies:  No Known Allergies  Past Medical History, Surgical history, Social history, and Family History were reviewed and updated.  Review of Systems: Review of Systems  Constitutional: Negative.   HENT:  Negative.    Eyes: Negative.   Respiratory: Negative.    Cardiovascular: Negative.   Gastrointestinal: Negative.   Endocrine: Negative.   Genitourinary:  Positive for nocturia.   Musculoskeletal:  Positive for arthralgias, gait problem and myalgias.  Neurological:  Positive for gait problem.  Hematological: Negative.   Psychiatric/Behavioral: Negative.     Physical Exam:  weight is 271 lb (122.9 kg). His oral temperature is 97.9 F (36.6 C). His blood pressure is 139/77 and his pulse is 98. His respiration is 18 and oxygen saturation is 100%.   Wt Readings from Last 3 Encounters:  12/01/20 271 lb (122.9 kg)  10/31/20 273 lb (123.8 kg)  10/24/20 275 lb (124.7 kg)    Physical Exam Vitals reviewed.  HENT:     Head: Normocephalic and atraumatic.  Eyes:     Pupils: Pupils are equal, round, and reactive to light.  Neck:     Comments: Examination of his neck shows no obvious palpable adenopathy.  Ronald Rice has some changes from radiation on the left side of the neck.  I cannot palpate any supraclavicular lymph nodes.   Cardiovascular:     Rate and Rhythm: Normal rate and regular rhythm.     Heart sounds: Normal heart sounds.  Pulmonary:     Effort: Pulmonary effort is normal.     Breath sounds: Normal breath sounds.  Abdominal:     General: Bowel sounds are normal.     Palpations: Abdomen is soft.  Musculoskeletal:         General: No tenderness or deformity. Normal range of motion.  Lymphadenopathy:     Cervical: No cervical adenopathy.  Skin:    General: Skin is warm and dry.     Findings: No erythema or rash.  Neurological:     Mental Status: Ronald Rice is alert and oriented to person, place, and time.  Psychiatric:        Behavior: Behavior normal.        Thought Content: Thought content normal.  Judgment: Judgment normal.   Lab Results  Component Value Date   WBC 33.5 (H) 12/01/2020   HGB 13.3 12/01/2020   HCT 37.7 (L) 12/01/2020   MCV 88.5 12/01/2020   PLT 166 12/01/2020     Chemistry      Component Value Date/Time   NA 140 12/01/2020 1019   NA 142 01/25/2017 1217   K 3.5 12/01/2020 1019   K 4.1 01/25/2017 1217   CL 104 12/01/2020 1019   CL 102 01/25/2017 1217   CO2 26 12/01/2020 1019   CO2 33 01/25/2017 1217   BUN 12 12/01/2020 1019   BUN 12 01/25/2017 1217   CREATININE 1.19 12/01/2020 1019   CREATININE 1.4 (H) 01/25/2017 1217      Component Value Date/Time   CALCIUM 9.8 12/01/2020 1019   CALCIUM 9.7 01/25/2017 1217   ALKPHOS 67 12/01/2020 1019   ALKPHOS 59 01/25/2017 1217   AST 15 12/01/2020 1019   ALT 16 12/01/2020 1019   ALT 32 01/25/2017 1217   BILITOT 0.6 12/01/2020 1019      Impression and Plan: Ronald Rice is a 75 year old African-American male.  Ronald Rice was diagnosed with CLL back in [place].  Ronald Rice then had transformation to a large cell non-Hodgkin's lymphoma.  Ronald Rice was treated with chemotherapy followed by radiation therapy to the neck given that Ronald Rice had bulky disease.   Ronald Rice completed his chemotherapy back in December 2020.  Ronald Rice then underwent radiation therapy which Ronald Rice completed I think in early February 2021.  Ronald Rice now has recurrence of the CLL.  Ronald Rice is on acalabrutinib.  Again, his white cell count has come up quite a bit.  I expect that this will start going back down.  At some point in the future, we will add venetoclax.  I think this is something that we will be  very helpful.  I told him that his  Take a good year or so before we can think about getting him off treatment.  I told him that Ronald Rice likely will have to live with the CLL and be treated for this.  Hopefully, our goal is that Ronald Rice not die from CLL.  I will plan to see him back in 6 weeks.   Volanda Napoleon, MD 7/11/202211:44 AM

## 2020-12-01 NOTE — Patient Instructions (Signed)
Implanted Port Home Guide An implanted port is a device that is placed under the skin. It is usually placed in the chest. The device can be used to give IV medicine, to take blood, or for dialysis. You may have an implanted port if: You need IV medicine that would be irritating to the small veins in your hands or arms. You need IV medicines, such as antibiotics, for a long period of time. You need IV nutrition for a long period of time. You need dialysis. When you have a port, your health care provider can choose to use the port instead of veins in your arms for these procedures. You may have fewer limitations when using a port than you would if you used other types of long-term IVs, and you will likely be able to return to normal activities afteryour incision heals. An implanted port has two main parts: Reservoir. The reservoir is the part where a needle is inserted to give medicines or draw blood. The reservoir is round. After it is placed, it appears as a small, raised area under your skin. Catheter. The catheter is a thin, flexible tube that connects the reservoir to a vein. Medicine that is inserted into the reservoir goes into the catheter and then into the vein. How is my port accessed? To access your port: A numbing cream may be placed on the skin over the port site. Your health care provider will put on a mask and sterile gloves. The skin over your port will be cleaned carefully with a germ-killing soap and allowed to dry. Your health care provider will gently pinch the port and insert a needle into it. Your health care provider will check for a blood return to make sure the port is in the vein and is not clogged. If your port needs to remain accessed to get medicine continuously (constant infusion), your health care provider will place a clear bandage (dressing) over the needle site. The dressing and needle will need to be changed every week, or as told by your health care provider. What  is flushing? Flushing helps keep the port from getting clogged. Follow instructions from your health care provider about how and when to flush the port. Ports are usually flushed with saline solution or a medicine called heparin. The need for flushing will depend on how the port is used: If the port is only used from time to time to give medicines or draw blood, the port may need to be flushed: Before and after medicines have been given. Before and after blood has been drawn. As part of routine maintenance. Flushing may be recommended every 4-6 weeks. If a constant infusion is running, the port may not need to be flushed. Throw away any syringes in a disposal container that is meant for sharp items (sharps container). You can buy a sharps container from a pharmacy, or you can make one by using an empty hard plastic bottle with a cover. How long will my port stay implanted? The port can stay in for as long as your health care provider thinks it is needed. When it is time for the port to come out, a surgery will be done to remove it. The surgery will be similar to the procedure that was done to putthe port in. Follow these instructions at home:  Flush your port as told by your health care provider. If you need an infusion over several days, follow instructions from your health care provider about how to take   care of your port site. Make sure you: Wash your hands with soap and water before you change your dressing. If soap and water are not available, use alcohol-based hand sanitizer. Change your dressing as told by your health care provider. Place any used dressings or infusion bags into a plastic bag. Throw that bag in the trash. Keep the dressing that covers the needle clean and dry. Do not get it wet. Do not use scissors or sharp objects near the tube. Keep the tube clamped, unless it is being used. Check your port site every day for signs of infection. Check for: Redness, swelling, or  pain. Fluid or blood. Pus or a bad smell. Protect the skin around the port site. Avoid wearing bra straps that rub or irritate the site. Protect the skin around your port from seat belts. Place a soft pad over your chest if needed. Bathe or shower as told by your health care provider. The site may get wet as long as you are not actively receiving an infusion. Return to your normal activities as told by your health care provider. Ask your health care provider what activities are safe for you. Carry a medical alert card or wear a medical alert bracelet at all times. This will let health care providers know that you have an implanted port in case of an emergency. Get help right away if: You have redness, swelling, or pain at the port site. You have fluid or blood coming from your port site. You have pus or a bad smell coming from the port site. You have a fever. Summary Implanted ports are usually placed in the chest for long-term IV access. Follow instructions from your health care provider about flushing the port and changing bandages (dressings). Take care of the area around your port by avoiding clothing that puts pressure on the area, and by watching for signs of infection. Protect the skin around your port from seat belts. Place a soft pad over your chest if needed. Get help right away if you have a fever or you have redness, swelling, pain, drainage, or a bad smell at the port site. This information is not intended to replace advice given to you by your health care provider. Make sure you discuss any questions you have with your healthcare provider. Document Revised: 09/24/2019 Document Reviewed: 09/24/2019 Elsevier Patient Education  2022 Elsevier Inc.  

## 2020-12-04 NOTE — Telephone Encounter (Signed)
Oral Oncology Patient Advocate Encounter  [employer] ([employer]) application completed and faxed to [phone].   [employer] patient assistance phone number for follow up is [phone].   This encounter will be updated until final determination.   [facility] [place] Phone [phone] Fax [phone]

## 2020-12-04 NOTE — Telephone Encounter (Signed)
Oral Oncology Patient Advocate Encounter  Received notification from [employer] Patient Assistance program that patient has been successfully enrolled into their program to receive Calquence from the manufacturer at $0 out of pocket until 05/23/21.    I called and spoke with patients wife, she knows we will have to re-apply.   Specialty Pharmacy that will dispense medication is [facility].  Patient knows to call the office with questions or concerns.   Oral Oncology Clinic will continue to follow.  [place] [place] Phone [phone] Fax [phone] 12/04/2020 8:19 AM

## 2020-12-14 DIAGNOSIS — Z9989 Dependence on other enabling machines and devices: Secondary | ICD-10-CM | POA: Diagnosis not present

## 2020-12-14 DIAGNOSIS — R509 Fever, unspecified: Secondary | ICD-10-CM | POA: Diagnosis not present

## 2020-12-14 DIAGNOSIS — Z20822 Contact with and (suspected) exposure to covid-19: Secondary | ICD-10-CM | POA: Diagnosis not present

## 2020-12-14 DIAGNOSIS — I129 Hypertensive chronic kidney disease with stage 1 through stage 4 chronic kidney disease, or unspecified chronic kidney disease: Secondary | ICD-10-CM | POA: Diagnosis not present

## 2020-12-14 DIAGNOSIS — R0902 Hypoxemia: Secondary | ICD-10-CM | POA: Diagnosis not present

## 2020-12-14 DIAGNOSIS — I10 Essential (primary) hypertension: Secondary | ICD-10-CM | POA: Diagnosis not present

## 2020-12-14 DIAGNOSIS — R000 Tachycardia, unspecified: Secondary | ICD-10-CM | POA: Diagnosis not present

## 2020-12-14 DIAGNOSIS — A419 Sepsis, unspecified organism: Secondary | ICD-10-CM | POA: Diagnosis not present

## 2020-12-14 DIAGNOSIS — R0689 Other abnormalities of breathing: Secondary | ICD-10-CM | POA: Diagnosis not present

## 2020-12-14 DIAGNOSIS — G4733 Obstructive sleep apnea (adult) (pediatric): Secondary | ICD-10-CM | POA: Diagnosis not present

## 2020-12-14 DIAGNOSIS — N390 Urinary tract infection, site not specified: Secondary | ICD-10-CM | POA: Diagnosis not present

## 2020-12-14 DIAGNOSIS — E872 Acidosis: Secondary | ICD-10-CM | POA: Diagnosis not present

## 2020-12-14 DIAGNOSIS — D72829 Elevated white blood cell count, unspecified: Secondary | ICD-10-CM | POA: Diagnosis not present

## 2020-12-14 DIAGNOSIS — R404 Transient alteration of awareness: Secondary | ICD-10-CM | POA: Diagnosis not present

## 2020-12-14 DIAGNOSIS — Z87898 Personal history of other specified conditions: Secondary | ICD-10-CM | POA: Diagnosis not present

## 2020-12-14 DIAGNOSIS — N281 Cyst of kidney, acquired: Secondary | ICD-10-CM | POA: Diagnosis not present

## 2020-12-14 DIAGNOSIS — C9110 Chronic lymphocytic leukemia of B-cell type not having achieved remission: Secondary | ICD-10-CM | POA: Diagnosis not present

## 2020-12-14 DIAGNOSIS — E119 Type 2 diabetes mellitus without complications: Secondary | ICD-10-CM | POA: Diagnosis not present

## 2020-12-14 DIAGNOSIS — E1122 Type 2 diabetes mellitus with diabetic chronic kidney disease: Secondary | ICD-10-CM | POA: Diagnosis not present

## 2020-12-14 DIAGNOSIS — E785 Hyperlipidemia, unspecified: Secondary | ICD-10-CM | POA: Diagnosis not present

## 2020-12-14 DIAGNOSIS — I2510 Atherosclerotic heart disease of native coronary artery without angina pectoris: Secondary | ICD-10-CM | POA: Diagnosis not present

## 2020-12-14 DIAGNOSIS — Z8572 Personal history of non-Hodgkin lymphomas: Secondary | ICD-10-CM | POA: Diagnosis not present

## 2020-12-15 ENCOUNTER — Other Ambulatory Visit: Payer: Self-pay | Admitting: Hematology & Oncology

## 2020-12-21 DIAGNOSIS — N390 Urinary tract infection, site not specified: Secondary | ICD-10-CM | POA: Diagnosis not present

## 2020-12-21 DIAGNOSIS — I10 Essential (primary) hypertension: Secondary | ICD-10-CM | POA: Diagnosis not present

## 2020-12-21 DIAGNOSIS — R5381 Other malaise: Secondary | ICD-10-CM | POA: Diagnosis not present

## 2020-12-21 DIAGNOSIS — E119 Type 2 diabetes mellitus without complications: Secondary | ICD-10-CM | POA: Diagnosis not present

## 2020-12-21 DIAGNOSIS — D72820 Lymphocytosis (symptomatic): Secondary | ICD-10-CM | POA: Diagnosis not present

## 2020-12-21 DIAGNOSIS — Z8572 Personal history of non-Hodgkin lymphomas: Secondary | ICD-10-CM | POA: Diagnosis not present

## 2020-12-23 DIAGNOSIS — E119 Type 2 diabetes mellitus without complications: Secondary | ICD-10-CM | POA: Diagnosis not present

## 2020-12-23 DIAGNOSIS — N390 Urinary tract infection, site not specified: Secondary | ICD-10-CM | POA: Diagnosis not present

## 2020-12-23 DIAGNOSIS — D72829 Elevated white blood cell count, unspecified: Secondary | ICD-10-CM | POA: Diagnosis not present

## 2020-12-23 DIAGNOSIS — R5381 Other malaise: Secondary | ICD-10-CM | POA: Diagnosis not present

## 2020-12-23 DIAGNOSIS — I10 Essential (primary) hypertension: Secondary | ICD-10-CM | POA: Diagnosis not present

## 2020-12-24 ENCOUNTER — Telehealth: Payer: Self-pay

## 2020-12-24 NOTE — Telephone Encounter (Signed)
Received call from pt's wife stating he was recently d/c from [facility] for a UTI. Questions if Dr Marin Olp needs to see him sooner than 8/22 for lab check. Labs from yesterday reviewed by MD. No need to be seen sooner. [name] aware to keep appts as scheduled for 8/22 and verbalizes understanding using teachback. dph

## 2020-12-26 DIAGNOSIS — M1990 Unspecified osteoarthritis, unspecified site: Secondary | ICD-10-CM | POA: Diagnosis not present

## 2020-12-26 DIAGNOSIS — N401 Enlarged prostate with lower urinary tract symptoms: Secondary | ICD-10-CM | POA: Diagnosis not present

## 2020-12-26 DIAGNOSIS — A419 Sepsis, unspecified organism: Secondary | ICD-10-CM | POA: Diagnosis not present

## 2020-12-26 DIAGNOSIS — I25118 Atherosclerotic heart disease of native coronary artery with other forms of angina pectoris: Secondary | ICD-10-CM | POA: Diagnosis not present

## 2020-12-26 DIAGNOSIS — E1142 Type 2 diabetes mellitus with diabetic polyneuropathy: Secondary | ICD-10-CM | POA: Diagnosis not present

## 2020-12-26 DIAGNOSIS — M109 Gout, unspecified: Secondary | ICD-10-CM | POA: Diagnosis not present

## 2020-12-26 DIAGNOSIS — N390 Urinary tract infection, site not specified: Secondary | ICD-10-CM | POA: Diagnosis not present

## 2020-12-26 DIAGNOSIS — G4731 Primary central sleep apnea: Secondary | ICD-10-CM | POA: Diagnosis not present

## 2020-12-26 DIAGNOSIS — N39498 Other specified urinary incontinence: Secondary | ICD-10-CM | POA: Diagnosis not present

## 2020-12-31 DIAGNOSIS — G4733 Obstructive sleep apnea (adult) (pediatric): Secondary | ICD-10-CM | POA: Diagnosis not present

## 2021-01-06 DIAGNOSIS — E1142 Type 2 diabetes mellitus with diabetic polyneuropathy: Secondary | ICD-10-CM | POA: Diagnosis not present

## 2021-01-06 DIAGNOSIS — M109 Gout, unspecified: Secondary | ICD-10-CM | POA: Diagnosis not present

## 2021-01-06 DIAGNOSIS — G4731 Primary central sleep apnea: Secondary | ICD-10-CM | POA: Diagnosis not present

## 2021-01-06 DIAGNOSIS — M1990 Unspecified osteoarthritis, unspecified site: Secondary | ICD-10-CM | POA: Diagnosis not present

## 2021-01-06 DIAGNOSIS — N401 Enlarged prostate with lower urinary tract symptoms: Secondary | ICD-10-CM | POA: Diagnosis not present

## 2021-01-06 DIAGNOSIS — N390 Urinary tract infection, site not specified: Secondary | ICD-10-CM | POA: Diagnosis not present

## 2021-01-06 DIAGNOSIS — I25118 Atherosclerotic heart disease of native coronary artery with other forms of angina pectoris: Secondary | ICD-10-CM | POA: Diagnosis not present

## 2021-01-06 DIAGNOSIS — N39498 Other specified urinary incontinence: Secondary | ICD-10-CM | POA: Diagnosis not present

## 2021-01-06 DIAGNOSIS — A419 Sepsis, unspecified organism: Secondary | ICD-10-CM | POA: Diagnosis not present

## 2021-01-08 DIAGNOSIS — T387X5D Adverse effect of androgens and anabolic congeners, subsequent encounter: Secondary | ICD-10-CM | POA: Diagnosis not present

## 2021-01-08 DIAGNOSIS — E291 Testicular hypofunction: Secondary | ICD-10-CM | POA: Diagnosis not present

## 2021-01-08 DIAGNOSIS — R339 Retention of urine, unspecified: Secondary | ICD-10-CM | POA: Diagnosis not present

## 2021-01-08 DIAGNOSIS — N138 Other obstructive and reflux uropathy: Secondary | ICD-10-CM | POA: Diagnosis not present

## 2021-01-08 DIAGNOSIS — N401 Enlarged prostate with lower urinary tract symptoms: Secondary | ICD-10-CM | POA: Diagnosis not present

## 2021-01-08 DIAGNOSIS — R3589 Other polyuria: Secondary | ICD-10-CM | POA: Diagnosis not present

## 2021-01-12 ENCOUNTER — Inpatient Hospital Stay (HOSPITAL_BASED_OUTPATIENT_CLINIC_OR_DEPARTMENT_OTHER): Payer: Medicare HMO | Admitting: Hematology & Oncology

## 2021-01-12 ENCOUNTER — Other Ambulatory Visit: Payer: Self-pay

## 2021-01-12 ENCOUNTER — Inpatient Hospital Stay: Payer: Medicare HMO

## 2021-01-12 ENCOUNTER — Telehealth: Payer: Self-pay

## 2021-01-12 ENCOUNTER — Encounter: Payer: Self-pay | Admitting: Hematology & Oncology

## 2021-01-12 ENCOUNTER — Inpatient Hospital Stay: Payer: Medicare HMO | Attending: Hematology & Oncology

## 2021-01-12 VITALS — BP 117/72 | HR 83 | Temp 98.2°F | Resp 18

## 2021-01-12 DIAGNOSIS — Z452 Encounter for adjustment and management of vascular access device: Secondary | ICD-10-CM | POA: Diagnosis not present

## 2021-01-12 DIAGNOSIS — E1140 Type 2 diabetes mellitus with diabetic neuropathy, unspecified: Secondary | ICD-10-CM | POA: Insufficient documentation

## 2021-01-12 DIAGNOSIS — C9112 Chronic lymphocytic leukemia of B-cell type in relapse: Secondary | ICD-10-CM | POA: Diagnosis not present

## 2021-01-12 DIAGNOSIS — Z8572 Personal history of non-Hodgkin lymphomas: Secondary | ICD-10-CM | POA: Insufficient documentation

## 2021-01-12 DIAGNOSIS — C9110 Chronic lymphocytic leukemia of B-cell type not having achieved remission: Secondary | ICD-10-CM

## 2021-01-12 DIAGNOSIS — Z95828 Presence of other vascular implants and grafts: Secondary | ICD-10-CM

## 2021-01-12 DIAGNOSIS — C8338 Diffuse large B-cell lymphoma, lymph nodes of multiple sites: Secondary | ICD-10-CM | POA: Diagnosis not present

## 2021-01-12 LAB — SAVE SMEAR(SSMR), FOR PROVIDER SLIDE REVIEW

## 2021-01-12 LAB — CBC WITH DIFFERENTIAL (CANCER CENTER ONLY)
Abs Immature Granulocytes: 0.33 10*3/uL — ABNORMAL HIGH (ref 0.00–0.07)
Basophils Absolute: 0.1 10*3/uL (ref 0.0–0.1)
Basophils Relative: 0 %
Eosinophils Absolute: 0.1 10*3/uL (ref 0.0–0.5)
Eosinophils Relative: 1 %
HCT: 35 % — ABNORMAL LOW (ref 39.0–52.0)
Hemoglobin: 12.3 g/dL — ABNORMAL LOW (ref 13.0–17.0)
Immature Granulocytes: 1 %
Lymphocytes Relative: 85 %
Lymphs Abs: 22.5 10*3/uL — ABNORMAL HIGH (ref 0.7–4.0)
MCH: 30.1 pg (ref 26.0–34.0)
MCHC: 35.1 g/dL (ref 30.0–36.0)
MCV: 85.6 fL (ref 80.0–100.0)
Monocytes Absolute: 0.6 10*3/uL (ref 0.1–1.0)
Monocytes Relative: 2 %
Neutro Abs: 2.8 10*3/uL (ref 1.7–7.7)
Neutrophils Relative %: 11 %
Platelet Count: 173 10*3/uL (ref 150–400)
RBC: 4.09 MIL/uL — ABNORMAL LOW (ref 4.22–5.81)
RDW: 13.7 % (ref 11.5–15.5)
WBC Count: 26.5 10*3/uL — ABNORMAL HIGH (ref 4.0–10.5)
nRBC: 0 % (ref 0.0–0.2)

## 2021-01-12 LAB — CMP (CANCER CENTER ONLY)
ALT: 17 U/L (ref 0–44)
AST: 18 U/L (ref 15–41)
Albumin: 4.2 g/dL (ref 3.5–5.0)
Alkaline Phosphatase: 62 U/L (ref 38–126)
Anion gap: 10 (ref 5–15)
BUN: 16 mg/dL (ref 8–23)
CO2: 25 mmol/L (ref 22–32)
Calcium: 9.4 mg/dL (ref 8.9–10.3)
Chloride: 104 mmol/L (ref 98–111)
Creatinine: 1.5 mg/dL — ABNORMAL HIGH (ref 0.61–1.24)
GFR, Estimated: 49 mL/min — ABNORMAL LOW (ref >60)
Glucose, Bld: 119 mg/dL — ABNORMAL HIGH (ref 70–99)
Potassium: 4 mmol/L (ref 3.5–5.1)
Sodium: 139 mmol/L (ref 135–145)
Total Bilirubin: 0.6 mg/dL (ref 0.3–1.2)
Total Protein: 6.7 g/dL (ref 6.5–8.1)

## 2021-01-12 LAB — LACTATE DEHYDROGENASE: LDH: 177 U/L (ref 98–192)

## 2021-01-12 MED ORDER — HEPARIN SOD (PORK) LOCK FLUSH 100 UNIT/ML IV SOLN
500.0000 [IU] | Freq: Once | INTRAVENOUS | Status: AC
  Administered 2021-01-12: 500 [IU] via INTRAVENOUS

## 2021-01-12 MED ORDER — SODIUM CHLORIDE 0.9% FLUSH
10.0000 mL | Freq: Once | INTRAVENOUS | Status: AC
  Administered 2021-01-12: 10 mL via INTRAVENOUS

## 2021-01-12 NOTE — Progress Notes (Signed)
Hematology and Oncology Follow Up Visit  Pavan Stopka [id number] May 17, 1946 75 y.o. 01/12/2021   Principle Diagnosis:  Diffuse large cell non-Hodgkin's lymphoma-Richter's transformation from CLL CLL recurrence  Current Therapy:   R-CHOP-s/p cycle #8-- started on 11/08/2018 XRT to the LEFT neck -- 4500 rad -- completed on 08/01/2019 Acalabrutinib 100 mg po BID -- start on 11/04/2020     Interim History:  Mr. Cuvelier is back for follow-up.  We saw him 6 weeks ago.  He has been doing pretty well.  He really has had no specific complaints.  He says he cannot really walk.  This is from his diabetes.  He has neuropathy.  He is doing okay with the acalabrutinib.  He really has had no problems with nausea or vomiting.  He does have some sweats.  This could be from the CLL or from the acalabrutinib.  It also could be secondary to blood sugars if they get on the low side.  He has had no issues with swollen lymph nodes.  His white cell count is trending downward again.  This also does not surprise me.  He has had no actual fever.  There is been no bleeding.  He has had no cough or shortness of breath.  Overall, I would have to say his performance status is probably ECOG 2.     Medications:  Current Outpatient Medications:    acalabrutinib (CALQUENCE) 100 MG capsule, Take 1 capsule (100 mg total) by mouth 2 (two) times daily., Disp: 60 capsule, Rfl: 6   acetaminophen (TYLENOL) 500 MG tablet, Take 1,000 mg by mouth every 6 (six) hours as needed (pain). , Disp: , Rfl:    allopurinol (ZYLOPRIM) 300 MG tablet, Take 300 mg by mouth daily., Disp: , Rfl:    aspirin EC 81 MG tablet, Take 81 mg by mouth daily., Disp: , Rfl:    atenolol (TENORMIN) 25 MG tablet, Take 25 mg by mouth daily. , Disp: , Rfl:    atorvastatin (LIPITOR) 40 MG tablet, Take 40 mg by mouth daily., Disp: , Rfl:    chlorthalidone (HYGROTON) 25 MG tablet, Take 25 mg by mouth daily with breakfast., Disp: , Rfl:    Cholecalciferol  (VITAMIN D3 PO), Take 1 tablet by mouth daily., Disp: , Rfl:    CINNAMON PO, Take 1 tablet by mouth daily., Disp: , Rfl:    diclofenac sodium (VOLTAREN) 1 % GEL, Apply 1 application topically as needed (pain). , Disp: , Rfl:    donepezil (ARICEPT ODT) 5 MG disintegrating tablet, Take 5 mg by mouth at bedtime., Disp: , Rfl:    DULoxetine (CYMBALTA) 60 MG capsule, TAKE 1 CAPSULE(60 MG) BY MOUTH DAILY, Disp: 90 capsule, Rfl: 3   Lancets (ONETOUCH DELICA PLUS [id number]) MISC, , Disp: , Rfl:    lidocaine-prilocaine (EMLA) cream, Apply to PAC 1 hour prior to procedure., Disp: 30 g, Rfl: 3   lisinopril (ZESTRIL) 40 MG tablet, Take 1 tablet (40 mg total) by mouth daily for 30 days., Disp: 30 tablet, Rfl: 0   metFORMIN (GLUCOPHAGE-XR) 500 MG 24 hr tablet, Take 500 mg by mouth every evening. , Disp: , Rfl:    potassium chloride (K-DUR) 10 MEQ tablet, Take 10 mEq by mouth daily. , Disp: , Rfl:    pregabalin (LYRICA) 75 MG capsule, Take 75 mg by mouth 2 (two) times daily., Disp: , Rfl:    pyridoxine (B-6) 250 MG tablet, Take 250 mg by mouth daily., Disp: , Rfl:    Semaglutide,0.25 or  0.'5MG'$ /DOS, (OZEMPIC, 0.25 OR 0.5 MG/DOSE,) 2 MG/1.5ML SOPN, once a week. 12/01/2020 0.25., Disp: , Rfl:    tadalafil (CIALIS) 20 MG tablet, Take 20 mg by mouth as needed., Disp: , Rfl:    tamsulosin (FLOMAX) 0.4 MG CAPS capsule, Take 0.4 mg by mouth daily., Disp: , Rfl:    traMADol (ULTRAM) 50 MG tablet, TAKE 1 TABLET BY MOUTH EVERY 6 HOURS AS NEEDED (Patient taking differently: Take 50 mg by mouth every 6 (six) hours as needed for moderate pain.), Disp: 90 tablet, Rfl: 0   famotidine (PEPCID) 20 MG tablet, Take 20 mg by mouth at bedtime as needed for heartburn. (Patient not taking: No sig reported), Disp: , Rfl:    ipratropium (ATROVENT) 0.06 % nasal spray, Place 1 spray into both nostrils daily as needed for rhinitis.  (Patient not taking: No sig reported), Disp: , Rfl:    loratadine (CLARITIN) 10 MG tablet, Take 10 mg by mouth  as needed. (Patient not taking: No sig reported), Disp: , Rfl:    Na Sulfate-K Sulfate-Mg Sulf 17.5-3.13-1.6 GM/177ML SOLN, Suprep (no substitutions)-TAKE AS DIRECTED. (Patient not taking: No sig reported), Disp: 354 mL, Rfl: 0  Allergies:  No Known Allergies  Past Medical History, Surgical history, Social history, and Family History were reviewed and updated.  Review of Systems: Review of Systems  Constitutional: Negative.   HENT:  Negative.    Eyes: Negative.   Respiratory: Negative.    Cardiovascular: Negative.   Gastrointestinal: Negative.   Endocrine: Negative.   Genitourinary:  Positive for nocturia.   Musculoskeletal:  Positive for arthralgias, gait problem and myalgias.  Neurological:  Positive for gait problem.  Hematological: Negative.   Psychiatric/Behavioral: Negative.     Physical Exam:  oral temperature is 98.2 F (36.8 C). His blood pressure is 117/72 and his pulse is 83. His respiration is 18 and oxygen saturation is 99%.   Wt Readings from Last 3 Encounters:  12/01/20 271 lb (122.9 kg)  10/31/20 273 lb (123.8 kg)  10/24/20 275 lb (124.7 kg)    Physical Exam Vitals reviewed.  HENT:     Head: Normocephalic and atraumatic.  Eyes:     Pupils: Pupils are equal, round, and reactive to light.  Neck:     Comments: Examination of his neck shows no obvious palpable adenopathy.  He has some changes from radiation on the left side of the neck.  I cannot palpate any supraclavicular lymph nodes.   Cardiovascular:     Rate and Rhythm: Normal rate and regular rhythm.     Heart sounds: Normal heart sounds.  Pulmonary:     Effort: Pulmonary effort is normal.     Breath sounds: Normal breath sounds.  Abdominal:     General: Bowel sounds are normal.     Palpations: Abdomen is soft.  Musculoskeletal:        General: No tenderness or deformity. Normal range of motion.  Lymphadenopathy:     Cervical: No cervical adenopathy.  Skin:    General: Skin is warm and dry.      Findings: No erythema or rash.  Neurological:     Mental Status: He is alert and oriented to person, place, and time.  Psychiatric:        Behavior: Behavior normal.        Thought Content: Thought content normal.        Judgment: Judgment normal.   Lab Results  Component Value Date   WBC 26.5 (H) 01/12/2021  HGB 12.3 (L) 01/12/2021   HCT 35.0 (L) 01/12/2021   MCV 85.6 01/12/2021   PLT 173 01/12/2021     Chemistry      Component Value Date/Time   NA 139 01/12/2021 1124   NA 142 01/25/2017 1217   K 4.0 01/12/2021 1124   K 4.1 01/25/2017 1217   CL 104 01/12/2021 1124   CL 102 01/25/2017 1217   CO2 25 01/12/2021 1124   CO2 33 01/25/2017 1217   BUN 16 01/12/2021 1124   BUN 12 01/25/2017 1217   CREATININE 1.50 (H) 01/12/2021 1124   CREATININE 1.4 (H) 01/25/2017 1217      Component Value Date/Time   CALCIUM 9.4 01/12/2021 1124   CALCIUM 9.7 01/25/2017 1217   ALKPHOS 62 01/12/2021 1124   ALKPHOS 59 01/25/2017 1217   AST 18 01/12/2021 1124   ALT 17 01/12/2021 1124   ALT 32 01/25/2017 1217   BILITOT 0.6 01/12/2021 1124      Impression and Plan: Mr. Volkman is a 75 year old African-American male.  He was diagnosed with CLL back in [place].  He then had transformation to a large cell non-Hodgkin's lymphoma.  He was treated with chemotherapy followed by radiation therapy to the neck given that he had bulky disease.   He completed his chemotherapy back in December 2020.  He then underwent radiation therapy which he completed I think in early February 2021.  He now has recurrence of the CLL.  He is on acalabrutinib.  His white cell count is trending downward again.  Hopefully, we will see his white cell count coming down really well when we see him back.  At some point, when I have to get him on venetoclax.  I think this would be a very reasonable way to go to try to help the CLL.  His blood sugars are doing really well right now.  I am happy about that part.  I would  like to see him back in October.  We will try to move his appointments out a little bit longer if we can.  This will just be easier on he and his wife.  Of note, his wife's brother apparently has had myeloma.  He lives in [place].  Sounds like he has rectal cancer now.  Things to look all that good for him.  If they want to go down to visit him, I have no problems with Mr. Ahlman traveling.    Volanda Napoleon, MD 8/22/202211:53 AM

## 2021-01-12 NOTE — Telephone Encounter (Signed)
Appts made and printed per 01/12/21 los  Bonniejean Piano 

## 2021-01-12 NOTE — Patient Instructions (Signed)

## 2021-01-13 DIAGNOSIS — N39498 Other specified urinary incontinence: Secondary | ICD-10-CM | POA: Diagnosis not present

## 2021-01-13 DIAGNOSIS — A419 Sepsis, unspecified organism: Secondary | ICD-10-CM | POA: Diagnosis not present

## 2021-01-13 DIAGNOSIS — N390 Urinary tract infection, site not specified: Secondary | ICD-10-CM | POA: Diagnosis not present

## 2021-01-13 DIAGNOSIS — N401 Enlarged prostate with lower urinary tract symptoms: Secondary | ICD-10-CM | POA: Diagnosis not present

## 2021-01-13 DIAGNOSIS — G4731 Primary central sleep apnea: Secondary | ICD-10-CM | POA: Diagnosis not present

## 2021-01-13 DIAGNOSIS — M1990 Unspecified osteoarthritis, unspecified site: Secondary | ICD-10-CM | POA: Diagnosis not present

## 2021-01-13 DIAGNOSIS — I25118 Atherosclerotic heart disease of native coronary artery with other forms of angina pectoris: Secondary | ICD-10-CM | POA: Diagnosis not present

## 2021-01-13 DIAGNOSIS — E1142 Type 2 diabetes mellitus with diabetic polyneuropathy: Secondary | ICD-10-CM | POA: Diagnosis not present

## 2021-01-13 DIAGNOSIS — M109 Gout, unspecified: Secondary | ICD-10-CM | POA: Diagnosis not present

## 2021-01-14 DIAGNOSIS — G4733 Obstructive sleep apnea (adult) (pediatric): Secondary | ICD-10-CM | POA: Diagnosis not present

## 2021-01-19 LAB — FISH,CLL PROGNOSTIC PANEL

## 2021-01-21 DIAGNOSIS — G4731 Primary central sleep apnea: Secondary | ICD-10-CM | POA: Diagnosis not present

## 2021-01-21 DIAGNOSIS — A419 Sepsis, unspecified organism: Secondary | ICD-10-CM | POA: Diagnosis not present

## 2021-01-21 DIAGNOSIS — I25118 Atherosclerotic heart disease of native coronary artery with other forms of angina pectoris: Secondary | ICD-10-CM | POA: Diagnosis not present

## 2021-01-21 DIAGNOSIS — N39498 Other specified urinary incontinence: Secondary | ICD-10-CM | POA: Diagnosis not present

## 2021-01-21 DIAGNOSIS — E1142 Type 2 diabetes mellitus with diabetic polyneuropathy: Secondary | ICD-10-CM | POA: Diagnosis not present

## 2021-01-21 DIAGNOSIS — N390 Urinary tract infection, site not specified: Secondary | ICD-10-CM | POA: Diagnosis not present

## 2021-01-21 DIAGNOSIS — N401 Enlarged prostate with lower urinary tract symptoms: Secondary | ICD-10-CM | POA: Diagnosis not present

## 2021-01-21 DIAGNOSIS — M1990 Unspecified osteoarthritis, unspecified site: Secondary | ICD-10-CM | POA: Diagnosis not present

## 2021-01-21 DIAGNOSIS — M109 Gout, unspecified: Secondary | ICD-10-CM | POA: Diagnosis not present

## 2021-01-25 DIAGNOSIS — M1990 Unspecified osteoarthritis, unspecified site: Secondary | ICD-10-CM | POA: Diagnosis not present

## 2021-01-25 DIAGNOSIS — N39498 Other specified urinary incontinence: Secondary | ICD-10-CM | POA: Diagnosis not present

## 2021-01-25 DIAGNOSIS — G4731 Primary central sleep apnea: Secondary | ICD-10-CM | POA: Diagnosis not present

## 2021-01-25 DIAGNOSIS — N401 Enlarged prostate with lower urinary tract symptoms: Secondary | ICD-10-CM | POA: Diagnosis not present

## 2021-01-25 DIAGNOSIS — A419 Sepsis, unspecified organism: Secondary | ICD-10-CM | POA: Diagnosis not present

## 2021-01-25 DIAGNOSIS — E1142 Type 2 diabetes mellitus with diabetic polyneuropathy: Secondary | ICD-10-CM | POA: Diagnosis not present

## 2021-01-25 DIAGNOSIS — M109 Gout, unspecified: Secondary | ICD-10-CM | POA: Diagnosis not present

## 2021-01-25 DIAGNOSIS — I25118 Atherosclerotic heart disease of native coronary artery with other forms of angina pectoris: Secondary | ICD-10-CM | POA: Diagnosis not present

## 2021-01-25 DIAGNOSIS — N390 Urinary tract infection, site not specified: Secondary | ICD-10-CM | POA: Diagnosis not present

## 2021-01-27 DIAGNOSIS — N390 Urinary tract infection, site not specified: Secondary | ICD-10-CM | POA: Diagnosis not present

## 2021-01-27 DIAGNOSIS — I25118 Atherosclerotic heart disease of native coronary artery with other forms of angina pectoris: Secondary | ICD-10-CM | POA: Diagnosis not present

## 2021-01-27 DIAGNOSIS — N39498 Other specified urinary incontinence: Secondary | ICD-10-CM | POA: Diagnosis not present

## 2021-01-27 DIAGNOSIS — M109 Gout, unspecified: Secondary | ICD-10-CM | POA: Diagnosis not present

## 2021-01-27 DIAGNOSIS — E1142 Type 2 diabetes mellitus with diabetic polyneuropathy: Secondary | ICD-10-CM | POA: Diagnosis not present

## 2021-01-27 DIAGNOSIS — G4731 Primary central sleep apnea: Secondary | ICD-10-CM | POA: Diagnosis not present

## 2021-01-27 DIAGNOSIS — N401 Enlarged prostate with lower urinary tract symptoms: Secondary | ICD-10-CM | POA: Diagnosis not present

## 2021-01-27 DIAGNOSIS — M1990 Unspecified osteoarthritis, unspecified site: Secondary | ICD-10-CM | POA: Diagnosis not present

## 2021-01-27 DIAGNOSIS — A419 Sepsis, unspecified organism: Secondary | ICD-10-CM | POA: Diagnosis not present

## 2021-01-28 DIAGNOSIS — E1140 Type 2 diabetes mellitus with diabetic neuropathy, unspecified: Secondary | ICD-10-CM | POA: Diagnosis not present

## 2021-02-02 DIAGNOSIS — I10 Essential (primary) hypertension: Secondary | ICD-10-CM | POA: Diagnosis not present

## 2021-02-02 DIAGNOSIS — Z23 Encounter for immunization: Secondary | ICD-10-CM | POA: Diagnosis not present

## 2021-02-02 DIAGNOSIS — E1140 Type 2 diabetes mellitus with diabetic neuropathy, unspecified: Secondary | ICD-10-CM | POA: Diagnosis not present

## 2021-02-03 DIAGNOSIS — A419 Sepsis, unspecified organism: Secondary | ICD-10-CM | POA: Diagnosis not present

## 2021-02-03 DIAGNOSIS — N401 Enlarged prostate with lower urinary tract symptoms: Secondary | ICD-10-CM | POA: Diagnosis not present

## 2021-02-03 DIAGNOSIS — N39498 Other specified urinary incontinence: Secondary | ICD-10-CM | POA: Diagnosis not present

## 2021-02-03 DIAGNOSIS — I25118 Atherosclerotic heart disease of native coronary artery with other forms of angina pectoris: Secondary | ICD-10-CM | POA: Diagnosis not present

## 2021-02-03 DIAGNOSIS — N390 Urinary tract infection, site not specified: Secondary | ICD-10-CM | POA: Diagnosis not present

## 2021-02-03 DIAGNOSIS — M1990 Unspecified osteoarthritis, unspecified site: Secondary | ICD-10-CM | POA: Diagnosis not present

## 2021-02-03 DIAGNOSIS — M109 Gout, unspecified: Secondary | ICD-10-CM | POA: Diagnosis not present

## 2021-02-03 DIAGNOSIS — G4731 Primary central sleep apnea: Secondary | ICD-10-CM | POA: Diagnosis not present

## 2021-02-03 DIAGNOSIS — E1142 Type 2 diabetes mellitus with diabetic polyneuropathy: Secondary | ICD-10-CM | POA: Diagnosis not present

## 2021-02-09 DIAGNOSIS — E1142 Type 2 diabetes mellitus with diabetic polyneuropathy: Secondary | ICD-10-CM | POA: Diagnosis not present

## 2021-02-09 DIAGNOSIS — A419 Sepsis, unspecified organism: Secondary | ICD-10-CM | POA: Diagnosis not present

## 2021-02-09 DIAGNOSIS — G4731 Primary central sleep apnea: Secondary | ICD-10-CM | POA: Diagnosis not present

## 2021-02-09 DIAGNOSIS — I25118 Atherosclerotic heart disease of native coronary artery with other forms of angina pectoris: Secondary | ICD-10-CM | POA: Diagnosis not present

## 2021-02-09 DIAGNOSIS — N401 Enlarged prostate with lower urinary tract symptoms: Secondary | ICD-10-CM | POA: Diagnosis not present

## 2021-02-09 DIAGNOSIS — N390 Urinary tract infection, site not specified: Secondary | ICD-10-CM | POA: Diagnosis not present

## 2021-02-09 DIAGNOSIS — N39498 Other specified urinary incontinence: Secondary | ICD-10-CM | POA: Diagnosis not present

## 2021-02-09 DIAGNOSIS — M1990 Unspecified osteoarthritis, unspecified site: Secondary | ICD-10-CM | POA: Diagnosis not present

## 2021-02-09 DIAGNOSIS — M109 Gout, unspecified: Secondary | ICD-10-CM | POA: Diagnosis not present

## 2021-02-11 ENCOUNTER — Encounter: Payer: Self-pay | Admitting: *Deleted

## 2021-02-23 DIAGNOSIS — G4731 Primary central sleep apnea: Secondary | ICD-10-CM | POA: Diagnosis not present

## 2021-02-23 DIAGNOSIS — M109 Gout, unspecified: Secondary | ICD-10-CM | POA: Diagnosis not present

## 2021-02-23 DIAGNOSIS — M1990 Unspecified osteoarthritis, unspecified site: Secondary | ICD-10-CM | POA: Diagnosis not present

## 2021-02-23 DIAGNOSIS — N390 Urinary tract infection, site not specified: Secondary | ICD-10-CM | POA: Diagnosis not present

## 2021-02-23 DIAGNOSIS — E1142 Type 2 diabetes mellitus with diabetic polyneuropathy: Secondary | ICD-10-CM | POA: Diagnosis not present

## 2021-02-23 DIAGNOSIS — I25118 Atherosclerotic heart disease of native coronary artery with other forms of angina pectoris: Secondary | ICD-10-CM | POA: Diagnosis not present

## 2021-02-23 DIAGNOSIS — N401 Enlarged prostate with lower urinary tract symptoms: Secondary | ICD-10-CM | POA: Diagnosis not present

## 2021-02-23 DIAGNOSIS — A419 Sepsis, unspecified organism: Secondary | ICD-10-CM | POA: Diagnosis not present

## 2021-02-23 DIAGNOSIS — N39498 Other specified urinary incontinence: Secondary | ICD-10-CM | POA: Diagnosis not present

## 2021-02-24 DIAGNOSIS — N39498 Other specified urinary incontinence: Secondary | ICD-10-CM | POA: Diagnosis not present

## 2021-02-24 DIAGNOSIS — N401 Enlarged prostate with lower urinary tract symptoms: Secondary | ICD-10-CM | POA: Diagnosis not present

## 2021-02-24 DIAGNOSIS — M109 Gout, unspecified: Secondary | ICD-10-CM | POA: Diagnosis not present

## 2021-02-24 DIAGNOSIS — G4731 Primary central sleep apnea: Secondary | ICD-10-CM | POA: Diagnosis not present

## 2021-02-24 DIAGNOSIS — M1990 Unspecified osteoarthritis, unspecified site: Secondary | ICD-10-CM | POA: Diagnosis not present

## 2021-02-24 DIAGNOSIS — E1142 Type 2 diabetes mellitus with diabetic polyneuropathy: Secondary | ICD-10-CM | POA: Diagnosis not present

## 2021-02-24 DIAGNOSIS — I25118 Atherosclerotic heart disease of native coronary artery with other forms of angina pectoris: Secondary | ICD-10-CM | POA: Diagnosis not present

## 2021-02-24 DIAGNOSIS — E041 Nontoxic single thyroid nodule: Secondary | ICD-10-CM | POA: Diagnosis not present

## 2021-02-24 DIAGNOSIS — N529 Male erectile dysfunction, unspecified: Secondary | ICD-10-CM | POA: Diagnosis not present

## 2021-03-04 DIAGNOSIS — E041 Nontoxic single thyroid nodule: Secondary | ICD-10-CM | POA: Diagnosis not present

## 2021-03-04 DIAGNOSIS — N401 Enlarged prostate with lower urinary tract symptoms: Secondary | ICD-10-CM | POA: Diagnosis not present

## 2021-03-04 DIAGNOSIS — I25118 Atherosclerotic heart disease of native coronary artery with other forms of angina pectoris: Secondary | ICD-10-CM | POA: Diagnosis not present

## 2021-03-04 DIAGNOSIS — N529 Male erectile dysfunction, unspecified: Secondary | ICD-10-CM | POA: Diagnosis not present

## 2021-03-04 DIAGNOSIS — G4731 Primary central sleep apnea: Secondary | ICD-10-CM | POA: Diagnosis not present

## 2021-03-04 DIAGNOSIS — E1142 Type 2 diabetes mellitus with diabetic polyneuropathy: Secondary | ICD-10-CM | POA: Diagnosis not present

## 2021-03-04 DIAGNOSIS — N39498 Other specified urinary incontinence: Secondary | ICD-10-CM | POA: Diagnosis not present

## 2021-03-04 DIAGNOSIS — M1990 Unspecified osteoarthritis, unspecified site: Secondary | ICD-10-CM | POA: Diagnosis not present

## 2021-03-04 DIAGNOSIS — M109 Gout, unspecified: Secondary | ICD-10-CM | POA: Diagnosis not present

## 2021-03-05 ENCOUNTER — Inpatient Hospital Stay: Payer: Medicare HMO | Attending: Hematology & Oncology

## 2021-03-05 ENCOUNTER — Inpatient Hospital Stay: Payer: Medicare HMO | Admitting: Hematology & Oncology

## 2021-03-05 ENCOUNTER — Inpatient Hospital Stay: Payer: Medicare HMO

## 2021-03-05 DIAGNOSIS — E1141 Type 2 diabetes mellitus with diabetic mononeuropathy: Secondary | ICD-10-CM | POA: Diagnosis not present

## 2021-03-05 DIAGNOSIS — E785 Hyperlipidemia, unspecified: Secondary | ICD-10-CM | POA: Diagnosis not present

## 2021-03-05 DIAGNOSIS — I10 Essential (primary) hypertension: Secondary | ICD-10-CM | POA: Diagnosis not present

## 2021-03-05 DIAGNOSIS — K5900 Constipation, unspecified: Secondary | ICD-10-CM | POA: Diagnosis not present

## 2021-03-05 DIAGNOSIS — G4733 Obstructive sleep apnea (adult) (pediatric): Secondary | ICD-10-CM | POA: Diagnosis not present

## 2021-03-11 DIAGNOSIS — N39498 Other specified urinary incontinence: Secondary | ICD-10-CM | POA: Diagnosis not present

## 2021-03-11 DIAGNOSIS — N401 Enlarged prostate with lower urinary tract symptoms: Secondary | ICD-10-CM | POA: Diagnosis not present

## 2021-03-11 DIAGNOSIS — I25118 Atherosclerotic heart disease of native coronary artery with other forms of angina pectoris: Secondary | ICD-10-CM | POA: Diagnosis not present

## 2021-03-11 DIAGNOSIS — E1142 Type 2 diabetes mellitus with diabetic polyneuropathy: Secondary | ICD-10-CM | POA: Diagnosis not present

## 2021-03-11 DIAGNOSIS — G4731 Primary central sleep apnea: Secondary | ICD-10-CM | POA: Diagnosis not present

## 2021-03-11 DIAGNOSIS — M1990 Unspecified osteoarthritis, unspecified site: Secondary | ICD-10-CM | POA: Diagnosis not present

## 2021-03-11 DIAGNOSIS — M109 Gout, unspecified: Secondary | ICD-10-CM | POA: Diagnosis not present

## 2021-03-11 DIAGNOSIS — E041 Nontoxic single thyroid nodule: Secondary | ICD-10-CM | POA: Diagnosis not present

## 2021-03-11 DIAGNOSIS — N529 Male erectile dysfunction, unspecified: Secondary | ICD-10-CM | POA: Diagnosis not present

## 2021-03-17 DIAGNOSIS — N529 Male erectile dysfunction, unspecified: Secondary | ICD-10-CM | POA: Diagnosis not present

## 2021-03-17 DIAGNOSIS — M109 Gout, unspecified: Secondary | ICD-10-CM | POA: Diagnosis not present

## 2021-03-17 DIAGNOSIS — G4731 Primary central sleep apnea: Secondary | ICD-10-CM | POA: Diagnosis not present

## 2021-03-17 DIAGNOSIS — I25118 Atherosclerotic heart disease of native coronary artery with other forms of angina pectoris: Secondary | ICD-10-CM | POA: Diagnosis not present

## 2021-03-17 DIAGNOSIS — E1142 Type 2 diabetes mellitus with diabetic polyneuropathy: Secondary | ICD-10-CM | POA: Diagnosis not present

## 2021-03-17 DIAGNOSIS — N401 Enlarged prostate with lower urinary tract symptoms: Secondary | ICD-10-CM | POA: Diagnosis not present

## 2021-03-17 DIAGNOSIS — E041 Nontoxic single thyroid nodule: Secondary | ICD-10-CM | POA: Diagnosis not present

## 2021-03-17 DIAGNOSIS — N39498 Other specified urinary incontinence: Secondary | ICD-10-CM | POA: Diagnosis not present

## 2021-03-17 DIAGNOSIS — M1990 Unspecified osteoarthritis, unspecified site: Secondary | ICD-10-CM | POA: Diagnosis not present

## 2021-03-23 ENCOUNTER — Telehealth: Payer: Self-pay | Admitting: Hematology & Oncology

## 2021-03-24 DIAGNOSIS — G4731 Primary central sleep apnea: Secondary | ICD-10-CM | POA: Diagnosis not present

## 2021-03-24 DIAGNOSIS — M109 Gout, unspecified: Secondary | ICD-10-CM | POA: Diagnosis not present

## 2021-03-24 DIAGNOSIS — M1990 Unspecified osteoarthritis, unspecified site: Secondary | ICD-10-CM | POA: Diagnosis not present

## 2021-03-24 DIAGNOSIS — E1142 Type 2 diabetes mellitus with diabetic polyneuropathy: Secondary | ICD-10-CM | POA: Diagnosis not present

## 2021-03-24 DIAGNOSIS — E041 Nontoxic single thyroid nodule: Secondary | ICD-10-CM | POA: Diagnosis not present

## 2021-03-24 DIAGNOSIS — N401 Enlarged prostate with lower urinary tract symptoms: Secondary | ICD-10-CM | POA: Diagnosis not present

## 2021-03-24 DIAGNOSIS — I25118 Atherosclerotic heart disease of native coronary artery with other forms of angina pectoris: Secondary | ICD-10-CM | POA: Diagnosis not present

## 2021-03-24 DIAGNOSIS — N39498 Other specified urinary incontinence: Secondary | ICD-10-CM | POA: Diagnosis not present

## 2021-03-24 DIAGNOSIS — N529 Male erectile dysfunction, unspecified: Secondary | ICD-10-CM | POA: Diagnosis not present

## 2021-03-26 DIAGNOSIS — I25118 Atherosclerotic heart disease of native coronary artery with other forms of angina pectoris: Secondary | ICD-10-CM | POA: Diagnosis not present

## 2021-03-26 DIAGNOSIS — G4731 Primary central sleep apnea: Secondary | ICD-10-CM | POA: Diagnosis not present

## 2021-03-26 DIAGNOSIS — N39498 Other specified urinary incontinence: Secondary | ICD-10-CM | POA: Diagnosis not present

## 2021-03-26 DIAGNOSIS — N401 Enlarged prostate with lower urinary tract symptoms: Secondary | ICD-10-CM | POA: Diagnosis not present

## 2021-03-26 DIAGNOSIS — E1142 Type 2 diabetes mellitus with diabetic polyneuropathy: Secondary | ICD-10-CM | POA: Diagnosis not present

## 2021-03-26 DIAGNOSIS — E041 Nontoxic single thyroid nodule: Secondary | ICD-10-CM | POA: Diagnosis not present

## 2021-03-26 DIAGNOSIS — M1990 Unspecified osteoarthritis, unspecified site: Secondary | ICD-10-CM | POA: Diagnosis not present

## 2021-03-26 DIAGNOSIS — N529 Male erectile dysfunction, unspecified: Secondary | ICD-10-CM | POA: Diagnosis not present

## 2021-03-26 DIAGNOSIS — M109 Gout, unspecified: Secondary | ICD-10-CM | POA: Diagnosis not present

## 2021-04-01 DIAGNOSIS — M109 Gout, unspecified: Secondary | ICD-10-CM | POA: Diagnosis not present

## 2021-04-01 DIAGNOSIS — I25118 Atherosclerotic heart disease of native coronary artery with other forms of angina pectoris: Secondary | ICD-10-CM | POA: Diagnosis not present

## 2021-04-01 DIAGNOSIS — N401 Enlarged prostate with lower urinary tract symptoms: Secondary | ICD-10-CM | POA: Diagnosis not present

## 2021-04-01 DIAGNOSIS — E041 Nontoxic single thyroid nodule: Secondary | ICD-10-CM | POA: Diagnosis not present

## 2021-04-01 DIAGNOSIS — M1990 Unspecified osteoarthritis, unspecified site: Secondary | ICD-10-CM | POA: Diagnosis not present

## 2021-04-01 DIAGNOSIS — E1142 Type 2 diabetes mellitus with diabetic polyneuropathy: Secondary | ICD-10-CM | POA: Diagnosis not present

## 2021-04-01 DIAGNOSIS — G4731 Primary central sleep apnea: Secondary | ICD-10-CM | POA: Diagnosis not present

## 2021-04-01 DIAGNOSIS — N39498 Other specified urinary incontinence: Secondary | ICD-10-CM | POA: Diagnosis not present

## 2021-04-01 DIAGNOSIS — N529 Male erectile dysfunction, unspecified: Secondary | ICD-10-CM | POA: Diagnosis not present

## 2021-04-07 DIAGNOSIS — N529 Male erectile dysfunction, unspecified: Secondary | ICD-10-CM | POA: Diagnosis not present

## 2021-04-07 DIAGNOSIS — G4731 Primary central sleep apnea: Secondary | ICD-10-CM | POA: Diagnosis not present

## 2021-04-07 DIAGNOSIS — M1990 Unspecified osteoarthritis, unspecified site: Secondary | ICD-10-CM | POA: Diagnosis not present

## 2021-04-07 DIAGNOSIS — N401 Enlarged prostate with lower urinary tract symptoms: Secondary | ICD-10-CM | POA: Diagnosis not present

## 2021-04-07 DIAGNOSIS — E1142 Type 2 diabetes mellitus with diabetic polyneuropathy: Secondary | ICD-10-CM | POA: Diagnosis not present

## 2021-04-07 DIAGNOSIS — N39498 Other specified urinary incontinence: Secondary | ICD-10-CM | POA: Diagnosis not present

## 2021-04-07 DIAGNOSIS — M109 Gout, unspecified: Secondary | ICD-10-CM | POA: Diagnosis not present

## 2021-04-07 DIAGNOSIS — I25118 Atherosclerotic heart disease of native coronary artery with other forms of angina pectoris: Secondary | ICD-10-CM | POA: Diagnosis not present

## 2021-04-07 DIAGNOSIS — E041 Nontoxic single thyroid nodule: Secondary | ICD-10-CM | POA: Diagnosis not present

## 2021-04-21 DIAGNOSIS — I25118 Atherosclerotic heart disease of native coronary artery with other forms of angina pectoris: Secondary | ICD-10-CM | POA: Diagnosis not present

## 2021-04-21 DIAGNOSIS — E041 Nontoxic single thyroid nodule: Secondary | ICD-10-CM | POA: Diagnosis not present

## 2021-04-21 DIAGNOSIS — N401 Enlarged prostate with lower urinary tract symptoms: Secondary | ICD-10-CM | POA: Diagnosis not present

## 2021-04-21 DIAGNOSIS — N529 Male erectile dysfunction, unspecified: Secondary | ICD-10-CM | POA: Diagnosis not present

## 2021-04-21 DIAGNOSIS — M1990 Unspecified osteoarthritis, unspecified site: Secondary | ICD-10-CM | POA: Diagnosis not present

## 2021-04-21 DIAGNOSIS — G4731 Primary central sleep apnea: Secondary | ICD-10-CM | POA: Diagnosis not present

## 2021-04-21 DIAGNOSIS — E1142 Type 2 diabetes mellitus with diabetic polyneuropathy: Secondary | ICD-10-CM | POA: Diagnosis not present

## 2021-04-21 DIAGNOSIS — N39498 Other specified urinary incontinence: Secondary | ICD-10-CM | POA: Diagnosis not present

## 2021-04-21 DIAGNOSIS — M109 Gout, unspecified: Secondary | ICD-10-CM | POA: Diagnosis not present

## 2021-04-27 DIAGNOSIS — Z981 Arthrodesis status: Secondary | ICD-10-CM | POA: Diagnosis not present

## 2021-04-27 DIAGNOSIS — M542 Cervicalgia: Secondary | ICD-10-CM | POA: Diagnosis not present

## 2021-04-27 DIAGNOSIS — M25551 Pain in right hip: Secondary | ICD-10-CM | POA: Diagnosis not present

## 2021-04-27 DIAGNOSIS — Y998 Other external cause status: Secondary | ICD-10-CM | POA: Diagnosis not present

## 2021-04-27 DIAGNOSIS — S0990XA Unspecified injury of head, initial encounter: Secondary | ICD-10-CM | POA: Diagnosis not present

## 2021-04-27 DIAGNOSIS — M25572 Pain in left ankle and joints of left foot: Secondary | ICD-10-CM | POA: Diagnosis not present

## 2021-04-27 DIAGNOSIS — W19XXXA Unspecified fall, initial encounter: Secondary | ICD-10-CM | POA: Diagnosis not present

## 2021-04-27 DIAGNOSIS — W01198A Fall on same level from slipping, tripping and stumbling with subsequent striking against other object, initial encounter: Secondary | ICD-10-CM | POA: Diagnosis not present

## 2021-04-27 DIAGNOSIS — Z043 Encounter for examination and observation following other accident: Secondary | ICD-10-CM | POA: Diagnosis not present

## 2021-04-27 DIAGNOSIS — M4802 Spinal stenosis, cervical region: Secondary | ICD-10-CM | POA: Diagnosis not present

## 2021-04-27 DIAGNOSIS — M25552 Pain in left hip: Secondary | ICD-10-CM | POA: Diagnosis not present

## 2021-04-27 DIAGNOSIS — M545 Low back pain, unspecified: Secondary | ICD-10-CM | POA: Diagnosis not present

## 2021-04-27 DIAGNOSIS — R9082 White matter disease, unspecified: Secondary | ICD-10-CM | POA: Diagnosis not present

## 2021-05-04 ENCOUNTER — Telehealth: Payer: Self-pay

## 2021-05-04 NOTE — Telephone Encounter (Signed)
Patients wife called asking for referral to PCP, called wife back and informed her we could not refer as a specialty clinic but recommended [facility] in the same building. She appreciated call back and denies any questions or concerns.

## 2021-05-05 ENCOUNTER — Ambulatory Visit: Payer: Medicare HMO | Admitting: Hematology & Oncology

## 2021-05-05 ENCOUNTER — Other Ambulatory Visit: Payer: Medicare HMO

## 2021-05-05 DIAGNOSIS — R109 Unspecified abdominal pain: Secondary | ICD-10-CM | POA: Diagnosis not present

## 2021-05-05 DIAGNOSIS — R197 Diarrhea, unspecified: Secondary | ICD-10-CM | POA: Diagnosis not present

## 2021-05-05 DIAGNOSIS — M109 Gout, unspecified: Secondary | ICD-10-CM | POA: Diagnosis not present

## 2021-05-05 DIAGNOSIS — I10 Essential (primary) hypertension: Secondary | ICD-10-CM | POA: Diagnosis not present

## 2021-05-05 DIAGNOSIS — K219 Gastro-esophageal reflux disease without esophagitis: Secondary | ICD-10-CM | POA: Diagnosis not present

## 2021-05-05 DIAGNOSIS — C9110 Chronic lymphocytic leukemia of B-cell type not having achieved remission: Secondary | ICD-10-CM | POA: Diagnosis not present

## 2021-05-05 DIAGNOSIS — E1142 Type 2 diabetes mellitus with diabetic polyneuropathy: Secondary | ICD-10-CM | POA: Diagnosis not present

## 2021-05-05 DIAGNOSIS — I639 Cerebral infarction, unspecified: Secondary | ICD-10-CM | POA: Diagnosis not present

## 2021-05-05 DIAGNOSIS — E785 Hyperlipidemia, unspecified: Secondary | ICD-10-CM | POA: Diagnosis not present

## 2021-05-05 DIAGNOSIS — R000 Tachycardia, unspecified: Secondary | ICD-10-CM | POA: Diagnosis not present

## 2021-05-05 DIAGNOSIS — K7689 Other specified diseases of liver: Secondary | ICD-10-CM | POA: Diagnosis not present

## 2021-05-05 DIAGNOSIS — K5730 Diverticulosis of large intestine without perforation or abscess without bleeding: Secondary | ICD-10-CM | POA: Diagnosis not present

## 2021-05-05 DIAGNOSIS — K529 Noninfective gastroenteritis and colitis, unspecified: Secondary | ICD-10-CM | POA: Diagnosis not present

## 2021-05-05 DIAGNOSIS — I959 Hypotension, unspecified: Secondary | ICD-10-CM | POA: Diagnosis not present

## 2021-05-05 DIAGNOSIS — R0902 Hypoxemia: Secondary | ICD-10-CM | POA: Diagnosis not present

## 2021-05-06 DIAGNOSIS — R197 Diarrhea, unspecified: Secondary | ICD-10-CM | POA: Diagnosis not present

## 2021-05-06 DIAGNOSIS — C9110 Chronic lymphocytic leukemia of B-cell type not having achieved remission: Secondary | ICD-10-CM | POA: Diagnosis not present

## 2021-05-06 DIAGNOSIS — E119 Type 2 diabetes mellitus without complications: Secondary | ICD-10-CM | POA: Diagnosis not present

## 2021-05-07 DIAGNOSIS — K529 Noninfective gastroenteritis and colitis, unspecified: Secondary | ICD-10-CM | POA: Diagnosis not present

## 2021-05-28 ENCOUNTER — Inpatient Hospital Stay: Payer: Medicare HMO

## 2021-05-28 ENCOUNTER — Telehealth: Payer: Self-pay | Admitting: Pharmacy Technician

## 2021-05-28 ENCOUNTER — Other Ambulatory Visit (HOSPITAL_COMMUNITY): Payer: Self-pay

## 2021-05-28 ENCOUNTER — Inpatient Hospital Stay (HOSPITAL_BASED_OUTPATIENT_CLINIC_OR_DEPARTMENT_OTHER): Payer: Medicare HMO | Admitting: Hematology & Oncology

## 2021-05-28 ENCOUNTER — Other Ambulatory Visit: Payer: Self-pay

## 2021-05-28 ENCOUNTER — Encounter: Payer: Self-pay | Admitting: Hematology & Oncology

## 2021-05-28 ENCOUNTER — Telehealth: Payer: Self-pay | Admitting: Pharmacist

## 2021-05-28 ENCOUNTER — Inpatient Hospital Stay: Payer: Medicare HMO | Attending: Hematology & Oncology

## 2021-05-28 VITALS — BP 116/64 | HR 77 | Temp 98.3°F | Resp 18 | Wt 274.0 lb

## 2021-05-28 DIAGNOSIS — Z95828 Presence of other vascular implants and grafts: Secondary | ICD-10-CM

## 2021-05-28 DIAGNOSIS — Z8572 Personal history of non-Hodgkin lymphomas: Secondary | ICD-10-CM | POA: Diagnosis not present

## 2021-05-28 DIAGNOSIS — C9110 Chronic lymphocytic leukemia of B-cell type not having achieved remission: Secondary | ICD-10-CM

## 2021-05-28 DIAGNOSIS — C9112 Chronic lymphocytic leukemia of B-cell type in relapse: Secondary | ICD-10-CM | POA: Insufficient documentation

## 2021-05-28 DIAGNOSIS — Z452 Encounter for adjustment and management of vascular access device: Secondary | ICD-10-CM | POA: Insufficient documentation

## 2021-05-28 LAB — CBC WITH DIFFERENTIAL (CANCER CENTER ONLY)
Abs Immature Granulocytes: 0.01 10*3/uL (ref 0.00–0.07)
Basophils Absolute: 0 10*3/uL (ref 0.0–0.1)
Basophils Relative: 0 %
Eosinophils Absolute: 0.1 10*3/uL (ref 0.0–0.5)
Eosinophils Relative: 1 %
HCT: 38.9 % — ABNORMAL LOW (ref 39.0–52.0)
Hemoglobin: 13.6 g/dL (ref 13.0–17.0)
Immature Granulocytes: 0 %
Lymphocytes Relative: 49 %
Lymphs Abs: 3.1 10*3/uL (ref 0.7–4.0)
MCH: 29.6 pg (ref 26.0–34.0)
MCHC: 35 g/dL (ref 30.0–36.0)
MCV: 84.6 fL (ref 80.0–100.0)
Monocytes Absolute: 0.6 10*3/uL (ref 0.1–1.0)
Monocytes Relative: 10 %
Neutro Abs: 2.5 10*3/uL (ref 1.7–7.7)
Neutrophils Relative %: 40 %
Platelet Count: 146 10*3/uL — ABNORMAL LOW (ref 150–400)
RBC: 4.6 MIL/uL (ref 4.22–5.81)
RDW: 14.4 % (ref 11.5–15.5)
WBC Count: 6.4 10*3/uL (ref 4.0–10.5)
nRBC: 0 % (ref 0.0–0.2)

## 2021-05-28 LAB — CMP (CANCER CENTER ONLY)
ALT: 20 U/L (ref 0–44)
AST: 17 U/L (ref 15–41)
Albumin: 4.2 g/dL (ref 3.5–5.0)
Alkaline Phosphatase: 45 U/L (ref 38–126)
Anion gap: 9 (ref 5–15)
BUN: 12 mg/dL (ref 8–23)
CO2: 30 mmol/L (ref 22–32)
Calcium: 9.5 mg/dL (ref 8.9–10.3)
Chloride: 101 mmol/L (ref 98–111)
Creatinine: 1.11 mg/dL (ref 0.61–1.24)
GFR, Estimated: >60 mL/min (ref >60)
Glucose, Bld: 162 mg/dL — ABNORMAL HIGH (ref 70–99)
Potassium: 3.6 mmol/L (ref 3.5–5.1)
Sodium: 140 mmol/L (ref 135–145)
Total Bilirubin: 0.7 mg/dL (ref 0.3–1.2)
Total Protein: 6.1 g/dL — ABNORMAL LOW (ref 6.5–8.1)

## 2021-05-28 LAB — SAVE SMEAR(SSMR), FOR PROVIDER SLIDE REVIEW

## 2021-05-28 LAB — URIC ACID: Uric Acid, Serum: 8.2 mg/dL (ref 3.7–8.6)

## 2021-05-28 LAB — LACTATE DEHYDROGENASE: LDH: 142 U/L (ref 98–192)

## 2021-05-28 MED ORDER — VENETOCLAX 100 MG PO TABS
100.0000 mg | ORAL_TABLET | Freq: Every day | ORAL | 6 refills | Status: DC
  Filled 2021-05-28: qty 30, 30d supply, fill #0

## 2021-05-28 MED ORDER — SODIUM CHLORIDE 0.9% FLUSH
10.0000 mL | Freq: Once | INTRAVENOUS | Status: AC
  Administered 2021-05-28: 10 mL via INTRAVENOUS

## 2021-05-28 MED ORDER — HEPARIN SOD (PORK) LOCK FLUSH 100 UNIT/ML IV SOLN
500.0000 [IU] | Freq: Once | INTRAVENOUS | Status: AC
  Administered 2021-05-28: 500 [IU] via INTRAVENOUS

## 2021-05-28 NOTE — Telephone Encounter (Signed)
Oral Oncology Pharmacist Encounter  Received new prescription for Venclexta (venetoclax) for the treatment of CLL in conjunction with Calquence, planned duration until disease progression or unacceptable drug toxicity.  CBC w/ Diff, CMP, LDH and Uric acid from 05/28/21 assessed, noted Uric acid of 8.2 mg/dL (would recommend continuing to monitor uric acid after therapy initiation), LDH stable WNL. Pltc of 146 K/uL. No plans for ramp up per MD. Prescription dosing and frequency per MD.  Current medication list in Epic reviewed, DDIs with Venclexta identified: Category C DDI between Venetoclax and Metformin as well as Venetoclax and Ozempic. Venetoclax may cause hyperglycemia, thus diminishing effects of metformin and Ozempic. Recommend monitoring for increase in blood glucose while on therapy.   Evaluated chart and no patient barriers to medication adherence noted.   Prescription has been e-scribed to the [facility] for benefits analysis and approval.  Oral Oncology Clinic will continue to follow for insurance authorization, copayment issues, initial counseling and start date.  Leron Croak, PharmD, BCPS Hematology/Oncology Clinical Pharmacist [facility] [place] [phone] 05/28/2021 3:31 PM

## 2021-05-28 NOTE — Progress Notes (Signed)
Hematology and Oncology Follow Up Visit  Ronald Rice [id number] 15-Apr-1946 76 y.o. 05/28/2021   Principle Diagnosis:  Diffuse large cell non-Hodgkin's lymphoma-Richter's transformation from CLL CLL recurrence  Current Therapy:   R-CHOP-s/p cycle #8-- started on 11/08/2018 XRT to the LEFT neck -- 4500 rad -- completed on 08/01/2019 Acalabrutinib 100 mg po BID -- start on 11/04/2020 Venetoclax 100 mg po q day -- start on 06/06/2021     Interim History:  Ronald Rice is back for follow-up.  For right now, we last saw him about 5 months ago.  He had she is doing quite nicely.  He is doing well on the acalabrutinib.  His white cell count is come down incredibly well.  I think that we can now think about doing venetoclax along with the acalabrutinib.  I think the 2 in combination will be able to work and be able to get him off treatment after couple years.  His blood sugars are doing very well.  His blood sugar today was 162.  He has had no problems with diarrhea.  He has had no issues with nausea or vomiting.    He still has neuropathy in his feet.  He comes in a wheelchair.  He has had no cough.  Thankfully, has been no problems with COVID.  Overall, his performance status is ECOG 2.   Medications:  Current Outpatient Medications:    acalabrutinib (CALQUENCE) 100 MG capsule, Take 1 capsule (100 mg total) by mouth 2 (two) times daily., Disp: 60 capsule, Rfl: 6   acetaminophen (TYLENOL) 500 MG tablet, Take 1,000 mg by mouth every 6 (six) hours as needed (pain). , Disp: , Rfl:    allopurinol (ZYLOPRIM) 300 MG tablet, Take 300 mg by mouth daily., Disp: , Rfl:    aspirin EC 81 MG tablet, Take 81 mg by mouth daily., Disp: , Rfl:    atenolol (TENORMIN) 25 MG tablet, Take 25 mg by mouth daily. , Disp: , Rfl:    atorvastatin (LIPITOR) 40 MG tablet, Take 40 mg by mouth daily., Disp: , Rfl:    chlorthalidone (HYGROTON) 25 MG tablet, Take 25 mg by mouth daily with breakfast., Disp: , Rfl:     Cholecalciferol (VITAMIN D3 PO), Take 1 tablet by mouth daily., Disp: , Rfl:    CINNAMON PO, Take 1 tablet by mouth daily., Disp: , Rfl:    diclofenac sodium (VOLTAREN) 1 % GEL, Apply 1 application topically as needed (pain). , Disp: , Rfl:    donepezil (ARICEPT ODT) 5 MG disintegrating tablet, Take 5 mg by mouth at bedtime., Disp: , Rfl:    DULoxetine (CYMBALTA) 60 MG capsule, TAKE 1 CAPSULE(60 MG) BY MOUTH DAILY, Disp: 90 capsule, Rfl: 3   famotidine (PEPCID) 20 MG tablet, Take 20 mg by mouth at bedtime as needed for heartburn. (Patient not taking: No sig reported), Disp: , Rfl:    ipratropium (ATROVENT) 0.06 % nasal spray, Place 1 spray into both nostrils daily as needed for rhinitis.  (Patient not taking: No sig reported), Disp: , Rfl:    Lancets (ONETOUCH DELICA PLUS [id number]) MISC, , Disp: , Rfl:    lidocaine-prilocaine (EMLA) cream, Apply to PAC 1 hour prior to procedure., Disp: 30 g, Rfl: 3   lisinopril (ZESTRIL) 40 MG tablet, Take 1 tablet (40 mg total) by mouth daily for 30 days., Disp: 30 tablet, Rfl: 0   loratadine (CLARITIN) 10 MG tablet, Take 10 mg by mouth as needed. (Patient not taking: No sig reported), Disp: ,  Rfl:    metFORMIN (GLUCOPHAGE-XR) 500 MG 24 hr tablet, Take 500 mg by mouth every evening. , Disp: , Rfl:    Na Sulfate-K Sulfate-Mg Sulf 17.5-3.13-1.6 GM/177ML SOLN, Suprep (no substitutions)-TAKE AS DIRECTED. (Patient not taking: No sig reported), Disp: 354 mL, Rfl: 0   potassium chloride (K-DUR) 10 MEQ tablet, Take 10 mEq by mouth daily. , Disp: , Rfl:    pregabalin (LYRICA) 75 MG capsule, Take 75 mg by mouth 2 (two) times daily., Disp: , Rfl:    pyridoxine (B-6) 250 MG tablet, Take 250 mg by mouth daily., Disp: , Rfl:    Semaglutide,0.25 or 0.5MG /DOS, (OZEMPIC, 0.25 OR 0.5 MG/DOSE,) 2 MG/1.5ML SOPN, once a week. 12/01/2020 0.25., Disp: , Rfl:    tadalafil (CIALIS) 20 MG tablet, Take 20 mg by mouth as needed., Disp: , Rfl:    tamsulosin (FLOMAX) 0.4 MG CAPS capsule, Take  0.4 mg by mouth daily., Disp: , Rfl:    traMADol (ULTRAM) 50 MG tablet, TAKE 1 TABLET BY MOUTH EVERY 6 HOURS AS NEEDED (Patient taking differently: Take 50 mg by mouth every 6 (six) hours as needed for moderate pain.), Disp: 90 tablet, Rfl: 0  Allergies:  No Known Allergies  Past Medical History, Surgical history, Social history, and Family History were reviewed and updated.  Review of Systems: Review of Systems  Constitutional: Negative.   HENT:  Negative.    Eyes: Negative.   Respiratory: Negative.    Cardiovascular: Negative.   Gastrointestinal: Negative.   Endocrine: Negative.   Genitourinary:  Positive for nocturia.   Musculoskeletal:  Positive for arthralgias, gait problem and myalgias.  Neurological:  Positive for gait problem.  Hematological: Negative.   Psychiatric/Behavioral: Negative.     Physical Exam:  weight is 274 lb (124.3 kg). His oral temperature is 98.3 F (36.8 C). His blood pressure is 116/64 and his pulse is 77. His respiration is 18 and oxygen saturation is 99%.   Wt Readings from Last 3 Encounters:  05/28/21 274 lb (124.3 kg)  12/01/20 271 lb (122.9 kg)  10/31/20 273 lb (123.8 kg)    Physical Exam Vitals reviewed.  HENT:     Head: Normocephalic and atraumatic.  Eyes:     Pupils: Pupils are equal, round, and reactive to light.  Neck:     Comments: Examination of his neck shows no obvious palpable adenopathy.  He has some changes from radiation on the left side of the neck.  I cannot palpate any supraclavicular lymph nodes.   Cardiovascular:     Rate and Rhythm: Normal rate and regular rhythm.     Heart sounds: Normal heart sounds.  Pulmonary:     Effort: Pulmonary effort is normal.     Breath sounds: Normal breath sounds.  Abdominal:     General: Bowel sounds are normal.     Palpations: Abdomen is soft.  Musculoskeletal:        General: No tenderness or deformity. Normal range of motion.  Lymphadenopathy:     Cervical: No cervical  adenopathy.  Skin:    General: Skin is warm and dry.     Findings: No erythema or rash.  Neurological:     Mental Status: He is alert and oriented to person, place, and time.  Psychiatric:        Behavior: Behavior normal.        Thought Content: Thought content normal.        Judgment: Judgment normal.   Lab Results  Component Value Date  WBC 6.4 05/28/2021   HGB 13.6 05/28/2021   HCT 38.9 (L) 05/28/2021   MCV 84.6 05/28/2021   PLT 146 (L) 05/28/2021     Chemistry      Component Value Date/Time   NA 139 01/12/2021 1124   NA 142 01/25/2017 1217   K 4.0 01/12/2021 1124   K 4.1 01/25/2017 1217   CL 104 01/12/2021 1124   CL 102 01/25/2017 1217   CO2 25 01/12/2021 1124   CO2 33 01/25/2017 1217   BUN 16 01/12/2021 1124   BUN 12 01/25/2017 1217   CREATININE 1.50 (H) 01/12/2021 1124   CREATININE 1.4 (H) 01/25/2017 1217      Component Value Date/Time   CALCIUM 9.4 01/12/2021 1124   CALCIUM 9.7 01/25/2017 1217   ALKPHOS 62 01/12/2021 1124   ALKPHOS 59 01/25/2017 1217   AST 18 01/12/2021 1124   ALT 17 01/12/2021 1124   ALT 32 01/25/2017 1217   BILITOT 0.6 01/12/2021 1124      Impression and Plan: Ronald Rice is a 76 year old African-American male.  He was diagnosed with CLL back in [place].  He then had transformation to a large cell non-Hodgkin's lymphoma.  He was treated with chemotherapy followed by radiation therapy to the neck given that he had bulky disease.   He completed his chemotherapy back in December 2020.  He then underwent radiation therapy which he completed I think in early February 2021.  He now has recurrence of the CLL.  He is on acalabrutinib.  We are going to go ahead and start him on venetoclax along with the acalabrutinib.  I really think that this would be a good way to go for [place].  I think this will help him.  I think this will improve his remission status.  His white cell count is low enough.  I do not think that there was going to be a problem  with tumor lysis.  We will try to get the venetoclax started.  I will start 100 mg a day.  I think this would be appropriate for him.  I will plan to get him back in about 7 weeks.  By then, he should be on the venetoclax had been on the combination for 1 month.    Volanda Napoleon, MD 1/5/20232:15 PM

## 2021-05-28 NOTE — Patient Instructions (Signed)

## 2021-05-28 NOTE — Telephone Encounter (Signed)
Oral Oncology Patient Advocate Encounter   Received notification from [employer] that prior authorization for Venclexta is required.   PA submitted on CoverMyMeds Key [id number] Status is pending   Oral Oncology Clinic will continue to follow.  [place] Patient [place] Phone [phone] Fax [phone] 05/29/2021 12:04 PM

## 2021-05-29 ENCOUNTER — Telehealth: Payer: Self-pay | Admitting: Hematology & Oncology

## 2021-05-29 LAB — IGG, IGA, IGM
IgA: 366 mg/dL (ref 61–437)
IgG (Immunoglobin G), Serum: 596 mg/dL — ABNORMAL LOW (ref 603–1613)
IgM (Immunoglobulin M), Srm: 20 mg/dL (ref 15–143)

## 2021-05-29 NOTE — Telephone Encounter (Signed)
Oral Oncology Patient Advocate Encounter  Received notification from [facility] that the request for prior authorization for Venclexta has been denied due to: the accepted use of this drug for your condition requires use as a single agent or in combination with rituximab.   An appeal will be submitted to reconsider this determination.  This encounter will continue to be updated until final determination.    [place] Patient [place] Phone [phone] Fax [phone] 05/29/2021 12:06 PM

## 2021-06-01 DIAGNOSIS — N189 Chronic kidney disease, unspecified: Secondary | ICD-10-CM | POA: Diagnosis not present

## 2021-06-01 DIAGNOSIS — I129 Hypertensive chronic kidney disease with stage 1 through stage 4 chronic kidney disease, or unspecified chronic kidney disease: Secondary | ICD-10-CM | POA: Diagnosis not present

## 2021-06-01 DIAGNOSIS — E1122 Type 2 diabetes mellitus with diabetic chronic kidney disease: Secondary | ICD-10-CM | POA: Diagnosis not present

## 2021-06-01 DIAGNOSIS — K529 Noninfective gastroenteritis and colitis, unspecified: Secondary | ICD-10-CM | POA: Diagnosis not present

## 2021-06-01 NOTE — Telephone Encounter (Signed)
Oral Oncology Pharmacist Encounter   Prior Authorization for Lynita Lombard has been denied.     Appeal letter sent to [facility] with supporting documentation. Appeal faxed to: [phone]    Oral Oncology Clinic will continue to follow.    Leron Croak, PharmD, BCPS Hematology/Oncology Clinical Pharmacist [facility] [phone] 06/01/2021 12:00 PM

## 2021-06-02 NOTE — Telephone Encounter (Signed)
Oral Oncology Pharmacist Encounter   1st level appeal for Venclexta has been upheld.   2nd level appeal letter sent to [facility] Part D reconsideration department with supporting documentation. Appeal faxed to: [phone]    Oral Oncology Clinic will continue to follow.    Leron Croak, PharmD, BCPS Hematology/Oncology Clinical Pharmacist [facility] [phone] 06/02/2021 10:21 AM

## 2021-06-05 NOTE — Telephone Encounter (Signed)
Oral Oncology Patient Advocate Encounter  An expedited appeal for the denial of Venclexta was faxed on 06/01/21 to [facility] by Leron Croak.  Received fax notification on 06/01/21 that the PA denial has been upheld.  A second level appeal was filed on 06/02/21 and on 06/05/21 received a voicemail from [facility] that stated the denial decision was being upheld. Denial packet was faxed to the office for Dr Marin Olp to review and decide how to proceed.  Ronald Rice Patient Ronald Rice Phone [phone] Fax [phone] 06/05/2021 10:18 AM

## 2021-06-05 NOTE — Telephone Encounter (Addendum)
Oral Oncology Pharmacist Encounter   Notified that 2st level appeal for Venclexta has been upheld. Patient's insurance will not approve Venclexta at this time to be used in combination with Calquence.    Determination letter will be sent to office if MD decides to proceed with further appeal at that time with medicare.   Patient's wife is aware that [place] treatment is on hold at this point unless there is a change in insurance decision with further appeals by the MD's office.   Leron Croak, PharmD, BCPS Hematology/Oncology Clinical Pharmacist [facility] [place] [phone] 06/05/2021 9:25 AM

## 2021-06-08 ENCOUNTER — Other Ambulatory Visit (HOSPITAL_COMMUNITY): Payer: Self-pay

## 2021-06-08 ENCOUNTER — Other Ambulatory Visit: Payer: Self-pay

## 2021-06-08 ENCOUNTER — Encounter: Payer: Self-pay | Admitting: Hematology & Oncology

## 2021-06-08 ENCOUNTER — Telehealth: Payer: Self-pay | Admitting: Pharmacy Technician

## 2021-06-08 DIAGNOSIS — I10 Essential (primary) hypertension: Secondary | ICD-10-CM | POA: Diagnosis not present

## 2021-06-08 DIAGNOSIS — Z125 Encounter for screening for malignant neoplasm of prostate: Secondary | ICD-10-CM | POA: Diagnosis not present

## 2021-06-08 DIAGNOSIS — E1140 Type 2 diabetes mellitus with diabetic neuropathy, unspecified: Secondary | ICD-10-CM | POA: Diagnosis not present

## 2021-06-08 DIAGNOSIS — E785 Hyperlipidemia, unspecified: Secondary | ICD-10-CM | POA: Diagnosis not present

## 2021-06-08 DIAGNOSIS — C9110 Chronic lymphocytic leukemia of B-cell type not having achieved remission: Secondary | ICD-10-CM

## 2021-06-08 MED ORDER — CALQUENCE 100 MG PO TABS
100.0000 mg | ORAL_TABLET | Freq: Two times a day (BID) | ORAL | 6 refills | Status: DC
  Filled 2021-06-08: qty 60, 30d supply, fill #0
  Filled 2021-06-08: qty 60, fill #0
  Filled 2021-06-29: qty 60, 30d supply, fill #1
  Filled 2021-07-23: qty 60, 30d supply, fill #2
  Filled 2021-08-19: qty 60, 30d supply, fill #3
  Filled 2021-09-07: qty 60, 30d supply, fill #4
  Filled 2021-10-05: qty 60, 30d supply, fill #5

## 2021-06-17 ENCOUNTER — Telehealth: Payer: Self-pay

## 2021-06-17 NOTE — Telephone Encounter (Signed)
Dr Marin Olp received call this am from [facility] attorney Tomasita Crumble. Per Tomasita Crumble, Venetoclax has received 3rd level appeal and requires a hearing with an Systems developer judge. Instructions to be faxed to our office.   "Notice of filing defect" and [phone]" forms received. Dr Antonieta Pert portion completed and signed. Letter written on behalf of pt appointing Dr Marin Olp as pt's representative during the hearing transcribed.   Pt's wife [name] notified via phone that pt will need to come in to sign letter and [id number] forms. [name] states they have appts most of the day tomorrow but they will come to the office by end of the day Friday to sign forms.   Hearing scheduled for 07/10/2021 at 3pm EST [facility]). Scheduling notified to clear Dr Antonieta Pert schedule after 1330.

## 2021-06-29 ENCOUNTER — Other Ambulatory Visit (HOSPITAL_COMMUNITY): Payer: Self-pay

## 2021-06-29 NOTE — Telephone Encounter (Signed)
Oral Oncology Patient Advocate Encounter   Was successful in securing patient a $ 10000 grant from [employer] to provide copayment coverage for his Calquence.  This will keep the out of pocket expense at $0.     I will call the patient to let them know of the approval.  The billing information is as follows and has been shared with Arnold Line.   Member ID: [id number] Group ID: [id number] RxBin: [id number] Dates of Eligibility: 03/10/21 through 06/08/22  Fund:  [place] Patient [place] Phone [phone] Fax [phone] 06/29/2021 8:50 AM

## 2021-06-30 ENCOUNTER — Other Ambulatory Visit (HOSPITAL_COMMUNITY): Payer: Self-pay

## 2021-07-01 ENCOUNTER — Other Ambulatory Visit (HOSPITAL_COMMUNITY): Payer: Self-pay

## 2021-07-07 ENCOUNTER — Encounter: Payer: Self-pay | Admitting: Hematology & Oncology

## 2021-07-09 DIAGNOSIS — Z125 Encounter for screening for malignant neoplasm of prostate: Secondary | ICD-10-CM | POA: Diagnosis not present

## 2021-07-09 DIAGNOSIS — C9190 Lymphoid leukemia, unspecified not having achieved remission: Secondary | ICD-10-CM | POA: Diagnosis not present

## 2021-07-09 DIAGNOSIS — E785 Hyperlipidemia, unspecified: Secondary | ICD-10-CM | POA: Diagnosis not present

## 2021-07-09 DIAGNOSIS — I10 Essential (primary) hypertension: Secondary | ICD-10-CM | POA: Diagnosis not present

## 2021-07-09 DIAGNOSIS — E1140 Type 2 diabetes mellitus with diabetic neuropathy, unspecified: Secondary | ICD-10-CM | POA: Diagnosis not present

## 2021-07-16 ENCOUNTER — Other Ambulatory Visit (HOSPITAL_COMMUNITY): Payer: Self-pay

## 2021-07-17 ENCOUNTER — Inpatient Hospital Stay: Payer: Medicare HMO | Admitting: Hematology & Oncology

## 2021-07-17 ENCOUNTER — Inpatient Hospital Stay: Payer: Medicare HMO | Attending: Hematology & Oncology

## 2021-07-17 ENCOUNTER — Inpatient Hospital Stay: Payer: Medicare HMO

## 2021-07-23 ENCOUNTER — Other Ambulatory Visit (HOSPITAL_COMMUNITY): Payer: Self-pay

## 2021-07-24 ENCOUNTER — Other Ambulatory Visit (HOSPITAL_COMMUNITY): Payer: Self-pay

## 2021-08-07 DIAGNOSIS — R0689 Other abnormalities of breathing: Secondary | ICD-10-CM | POA: Diagnosis not present

## 2021-08-07 DIAGNOSIS — I25118 Atherosclerotic heart disease of native coronary artery with other forms of angina pectoris: Secondary | ICD-10-CM | POA: Diagnosis not present

## 2021-08-07 DIAGNOSIS — R5381 Other malaise: Secondary | ICD-10-CM | POA: Diagnosis not present

## 2021-08-07 DIAGNOSIS — M25512 Pain in left shoulder: Secondary | ICD-10-CM | POA: Diagnosis not present

## 2021-08-07 DIAGNOSIS — G319 Degenerative disease of nervous system, unspecified: Secondary | ICD-10-CM | POA: Diagnosis not present

## 2021-08-07 DIAGNOSIS — B9620 Unspecified Escherichia coli [E. coli] as the cause of diseases classified elsewhere: Secondary | ICD-10-CM | POA: Diagnosis not present

## 2021-08-07 DIAGNOSIS — Z743 Need for continuous supervision: Secondary | ICD-10-CM | POA: Diagnosis not present

## 2021-08-07 DIAGNOSIS — C8338 Diffuse large B-cell lymphoma, lymph nodes of multiple sites: Secondary | ICD-10-CM | POA: Diagnosis not present

## 2021-08-07 DIAGNOSIS — A419 Sepsis, unspecified organism: Secondary | ICD-10-CM | POA: Diagnosis not present

## 2021-08-07 DIAGNOSIS — R739 Hyperglycemia, unspecified: Secondary | ICD-10-CM | POA: Diagnosis not present

## 2021-08-07 DIAGNOSIS — N401 Enlarged prostate with lower urinary tract symptoms: Secondary | ICD-10-CM | POA: Diagnosis not present

## 2021-08-07 DIAGNOSIS — N3289 Other specified disorders of bladder: Secondary | ICD-10-CM | POA: Diagnosis not present

## 2021-08-07 DIAGNOSIS — N3000 Acute cystitis without hematuria: Secondary | ICD-10-CM | POA: Diagnosis not present

## 2021-08-07 DIAGNOSIS — K7689 Other specified diseases of liver: Secondary | ICD-10-CM | POA: Diagnosis not present

## 2021-08-07 DIAGNOSIS — G119 Hereditary ataxia, unspecified: Secondary | ICD-10-CM | POA: Diagnosis not present

## 2021-08-07 DIAGNOSIS — G629 Polyneuropathy, unspecified: Secondary | ICD-10-CM | POA: Diagnosis not present

## 2021-08-07 DIAGNOSIS — R4182 Altered mental status, unspecified: Secondary | ICD-10-CM | POA: Diagnosis not present

## 2021-08-07 DIAGNOSIS — K429 Umbilical hernia without obstruction or gangrene: Secondary | ICD-10-CM | POA: Diagnosis not present

## 2021-08-07 DIAGNOSIS — R410 Disorientation, unspecified: Secondary | ICD-10-CM | POA: Diagnosis not present

## 2021-08-07 DIAGNOSIS — R531 Weakness: Secondary | ICD-10-CM | POA: Diagnosis not present

## 2021-08-07 DIAGNOSIS — C8518 Unspecified B-cell lymphoma, lymph nodes of multiple sites: Secondary | ICD-10-CM | POA: Diagnosis not present

## 2021-08-07 DIAGNOSIS — E1142 Type 2 diabetes mellitus with diabetic polyneuropathy: Secondary | ICD-10-CM | POA: Diagnosis not present

## 2021-08-07 DIAGNOSIS — C8590 Non-Hodgkin lymphoma, unspecified, unspecified site: Secondary | ICD-10-CM | POA: Diagnosis not present

## 2021-08-07 DIAGNOSIS — R6520 Severe sepsis without septic shock: Secondary | ICD-10-CM | POA: Diagnosis not present

## 2021-08-07 DIAGNOSIS — F330 Major depressive disorder, recurrent, mild: Secondary | ICD-10-CM | POA: Diagnosis not present

## 2021-08-07 DIAGNOSIS — N281 Cyst of kidney, acquired: Secondary | ICD-10-CM | POA: Diagnosis not present

## 2021-08-07 DIAGNOSIS — G928 Other toxic encephalopathy: Secondary | ICD-10-CM | POA: Diagnosis not present

## 2021-08-07 DIAGNOSIS — T82848A Pain from vascular prosthetic devices, implants and grafts, initial encounter: Secondary | ICD-10-CM | POA: Diagnosis not present

## 2021-08-07 DIAGNOSIS — R509 Fever, unspecified: Secondary | ICD-10-CM | POA: Diagnosis not present

## 2021-08-07 DIAGNOSIS — N390 Urinary tract infection, site not specified: Secondary | ICD-10-CM | POA: Diagnosis not present

## 2021-08-07 DIAGNOSIS — E872 Acidosis, unspecified: Secondary | ICD-10-CM | POA: Diagnosis not present

## 2021-08-07 DIAGNOSIS — G9341 Metabolic encephalopathy: Secondary | ICD-10-CM | POA: Diagnosis not present

## 2021-08-07 DIAGNOSIS — Z7409 Other reduced mobility: Secondary | ICD-10-CM | POA: Diagnosis not present

## 2021-08-07 DIAGNOSIS — I2510 Atherosclerotic heart disease of native coronary artery without angina pectoris: Secondary | ICD-10-CM | POA: Diagnosis not present

## 2021-08-07 DIAGNOSIS — E878 Other disorders of electrolyte and fluid balance, not elsewhere classified: Secondary | ICD-10-CM | POA: Diagnosis not present

## 2021-08-07 DIAGNOSIS — K5730 Diverticulosis of large intestine without perforation or abscess without bleeding: Secondary | ICD-10-CM | POA: Diagnosis not present

## 2021-08-08 DIAGNOSIS — A419 Sepsis, unspecified organism: Secondary | ICD-10-CM | POA: Diagnosis not present

## 2021-08-08 DIAGNOSIS — G629 Polyneuropathy, unspecified: Secondary | ICD-10-CM | POA: Diagnosis not present

## 2021-08-08 DIAGNOSIS — C8590 Non-Hodgkin lymphoma, unspecified, unspecified site: Secondary | ICD-10-CM | POA: Diagnosis not present

## 2021-08-08 DIAGNOSIS — R6520 Severe sepsis without septic shock: Secondary | ICD-10-CM | POA: Diagnosis not present

## 2021-08-08 DIAGNOSIS — I2510 Atherosclerotic heart disease of native coronary artery without angina pectoris: Secondary | ICD-10-CM | POA: Diagnosis not present

## 2021-08-09 DIAGNOSIS — A419 Sepsis, unspecified organism: Secondary | ICD-10-CM | POA: Diagnosis not present

## 2021-08-09 DIAGNOSIS — R6520 Severe sepsis without septic shock: Secondary | ICD-10-CM | POA: Diagnosis not present

## 2021-08-09 DIAGNOSIS — G629 Polyneuropathy, unspecified: Secondary | ICD-10-CM | POA: Diagnosis not present

## 2021-08-09 DIAGNOSIS — C8590 Non-Hodgkin lymphoma, unspecified, unspecified site: Secondary | ICD-10-CM | POA: Diagnosis not present

## 2021-08-09 DIAGNOSIS — I2510 Atherosclerotic heart disease of native coronary artery without angina pectoris: Secondary | ICD-10-CM | POA: Diagnosis not present

## 2021-08-10 DIAGNOSIS — G629 Polyneuropathy, unspecified: Secondary | ICD-10-CM | POA: Diagnosis not present

## 2021-08-10 DIAGNOSIS — I2510 Atherosclerotic heart disease of native coronary artery without angina pectoris: Secondary | ICD-10-CM | POA: Diagnosis not present

## 2021-08-10 DIAGNOSIS — R6520 Severe sepsis without septic shock: Secondary | ICD-10-CM | POA: Diagnosis not present

## 2021-08-10 DIAGNOSIS — A419 Sepsis, unspecified organism: Secondary | ICD-10-CM | POA: Diagnosis not present

## 2021-08-10 DIAGNOSIS — N3000 Acute cystitis without hematuria: Secondary | ICD-10-CM | POA: Diagnosis not present

## 2021-08-10 DIAGNOSIS — C8590 Non-Hodgkin lymphoma, unspecified, unspecified site: Secondary | ICD-10-CM | POA: Diagnosis not present

## 2021-08-11 DIAGNOSIS — M25512 Pain in left shoulder: Secondary | ICD-10-CM | POA: Diagnosis not present

## 2021-08-11 DIAGNOSIS — E878 Other disorders of electrolyte and fluid balance, not elsewhere classified: Secondary | ICD-10-CM | POA: Diagnosis not present

## 2021-08-11 DIAGNOSIS — N390 Urinary tract infection, site not specified: Secondary | ICD-10-CM | POA: Diagnosis not present

## 2021-08-11 DIAGNOSIS — A419 Sepsis, unspecified organism: Secondary | ICD-10-CM | POA: Diagnosis not present

## 2021-08-11 DIAGNOSIS — C8590 Non-Hodgkin lymphoma, unspecified, unspecified site: Secondary | ICD-10-CM | POA: Diagnosis not present

## 2021-08-11 DIAGNOSIS — R5381 Other malaise: Secondary | ICD-10-CM | POA: Diagnosis not present

## 2021-08-12 DIAGNOSIS — N390 Urinary tract infection, site not specified: Secondary | ICD-10-CM | POA: Diagnosis not present

## 2021-08-12 DIAGNOSIS — A419 Sepsis, unspecified organism: Secondary | ICD-10-CM | POA: Diagnosis not present

## 2021-08-12 DIAGNOSIS — G928 Other toxic encephalopathy: Secondary | ICD-10-CM | POA: Diagnosis not present

## 2021-08-12 DIAGNOSIS — Z7409 Other reduced mobility: Secondary | ICD-10-CM | POA: Diagnosis not present

## 2021-08-12 DIAGNOSIS — C8590 Non-Hodgkin lymphoma, unspecified, unspecified site: Secondary | ICD-10-CM | POA: Diagnosis not present

## 2021-08-13 DIAGNOSIS — E119 Type 2 diabetes mellitus without complications: Secondary | ICD-10-CM | POA: Diagnosis not present

## 2021-08-13 DIAGNOSIS — E1142 Type 2 diabetes mellitus with diabetic polyneuropathy: Secondary | ICD-10-CM | POA: Diagnosis not present

## 2021-08-13 DIAGNOSIS — B9620 Unspecified Escherichia coli [E. coli] as the cause of diseases classified elsewhere: Secondary | ICD-10-CM | POA: Diagnosis not present

## 2021-08-13 DIAGNOSIS — F330 Major depressive disorder, recurrent, mild: Secondary | ICD-10-CM | POA: Diagnosis not present

## 2021-08-13 DIAGNOSIS — N401 Enlarged prostate with lower urinary tract symptoms: Secondary | ICD-10-CM | POA: Diagnosis not present

## 2021-08-13 DIAGNOSIS — R7881 Bacteremia: Secondary | ICD-10-CM | POA: Diagnosis not present

## 2021-08-13 DIAGNOSIS — R6520 Severe sepsis without septic shock: Secondary | ICD-10-CM | POA: Diagnosis not present

## 2021-08-13 DIAGNOSIS — I2510 Atherosclerotic heart disease of native coronary artery without angina pectoris: Secondary | ICD-10-CM | POA: Diagnosis not present

## 2021-08-13 DIAGNOSIS — R5381 Other malaise: Secondary | ICD-10-CM | POA: Diagnosis not present

## 2021-08-13 DIAGNOSIS — A419 Sepsis, unspecified organism: Secondary | ICD-10-CM | POA: Diagnosis not present

## 2021-08-13 DIAGNOSIS — R2681 Unsteadiness on feet: Secondary | ICD-10-CM | POA: Diagnosis not present

## 2021-08-13 DIAGNOSIS — Z743 Need for continuous supervision: Secondary | ICD-10-CM | POA: Diagnosis not present

## 2021-08-13 DIAGNOSIS — I10 Essential (primary) hypertension: Secondary | ICD-10-CM | POA: Diagnosis not present

## 2021-08-13 DIAGNOSIS — G629 Polyneuropathy, unspecified: Secondary | ICD-10-CM | POA: Diagnosis not present

## 2021-08-13 DIAGNOSIS — G119 Hereditary ataxia, unspecified: Secondary | ICD-10-CM | POA: Diagnosis not present

## 2021-08-13 DIAGNOSIS — G4733 Obstructive sleep apnea (adult) (pediatric): Secondary | ICD-10-CM | POA: Diagnosis not present

## 2021-08-13 DIAGNOSIS — C8590 Non-Hodgkin lymphoma, unspecified, unspecified site: Secondary | ICD-10-CM | POA: Diagnosis not present

## 2021-08-13 DIAGNOSIS — N390 Urinary tract infection, site not specified: Secondary | ICD-10-CM | POA: Diagnosis not present

## 2021-08-13 DIAGNOSIS — C8300 Small cell B-cell lymphoma, unspecified site: Secondary | ICD-10-CM | POA: Diagnosis not present

## 2021-08-13 DIAGNOSIS — R531 Weakness: Secondary | ICD-10-CM | POA: Diagnosis not present

## 2021-08-13 DIAGNOSIS — N400 Enlarged prostate without lower urinary tract symptoms: Secondary | ICD-10-CM | POA: Diagnosis not present

## 2021-08-13 DIAGNOSIS — C8518 Unspecified B-cell lymphoma, lymph nodes of multiple sites: Secondary | ICD-10-CM | POA: Diagnosis not present

## 2021-08-13 DIAGNOSIS — F0390 Unspecified dementia without behavioral disturbance: Secondary | ICD-10-CM | POA: Diagnosis not present

## 2021-08-13 DIAGNOSIS — M25512 Pain in left shoulder: Secondary | ICD-10-CM | POA: Diagnosis not present

## 2021-08-14 ENCOUNTER — Encounter: Payer: Self-pay | Admitting: *Deleted

## 2021-08-14 ENCOUNTER — Telehealth: Payer: Self-pay | Admitting: Hematology & Oncology

## 2021-08-14 DIAGNOSIS — R2681 Unsteadiness on feet: Secondary | ICD-10-CM | POA: Diagnosis not present

## 2021-08-14 DIAGNOSIS — C8300 Small cell B-cell lymphoma, unspecified site: Secondary | ICD-10-CM | POA: Diagnosis not present

## 2021-08-14 DIAGNOSIS — E1142 Type 2 diabetes mellitus with diabetic polyneuropathy: Secondary | ICD-10-CM | POA: Diagnosis not present

## 2021-08-14 DIAGNOSIS — N390 Urinary tract infection, site not specified: Secondary | ICD-10-CM | POA: Diagnosis not present

## 2021-08-14 DIAGNOSIS — R7881 Bacteremia: Secondary | ICD-10-CM | POA: Diagnosis not present

## 2021-08-14 NOTE — Progress Notes (Signed)
Fax received from office of medicare hearings and appeals to inform Dr. Marin Olp that Ronald Rice was denied after hearing on 07/10/2021.  Dr. Marin Olp notified.  ?

## 2021-08-14 NOTE — Telephone Encounter (Signed)
Called to schedule per 3/24 sch msg, patient needs a follow up appointment , left voicemail  ?

## 2021-08-17 ENCOUNTER — Telehealth: Payer: Self-pay | Admitting: Hematology & Oncology

## 2021-08-17 DIAGNOSIS — I10 Essential (primary) hypertension: Secondary | ICD-10-CM | POA: Diagnosis not present

## 2021-08-17 DIAGNOSIS — C8590 Non-Hodgkin lymphoma, unspecified, unspecified site: Secondary | ICD-10-CM | POA: Diagnosis not present

## 2021-08-17 DIAGNOSIS — G4733 Obstructive sleep apnea (adult) (pediatric): Secondary | ICD-10-CM | POA: Diagnosis not present

## 2021-08-17 DIAGNOSIS — R7881 Bacteremia: Secondary | ICD-10-CM | POA: Diagnosis not present

## 2021-08-17 DIAGNOSIS — E1142 Type 2 diabetes mellitus with diabetic polyneuropathy: Secondary | ICD-10-CM | POA: Diagnosis not present

## 2021-08-17 DIAGNOSIS — N390 Urinary tract infection, site not specified: Secondary | ICD-10-CM | POA: Diagnosis not present

## 2021-08-17 DIAGNOSIS — C8300 Small cell B-cell lymphoma, unspecified site: Secondary | ICD-10-CM | POA: Diagnosis not present

## 2021-08-17 DIAGNOSIS — R2681 Unsteadiness on feet: Secondary | ICD-10-CM | POA: Diagnosis not present

## 2021-08-17 DIAGNOSIS — G629 Polyneuropathy, unspecified: Secondary | ICD-10-CM | POA: Diagnosis not present

## 2021-08-17 DIAGNOSIS — R6520 Severe sepsis without septic shock: Secondary | ICD-10-CM | POA: Diagnosis not present

## 2021-08-17 DIAGNOSIS — E119 Type 2 diabetes mellitus without complications: Secondary | ICD-10-CM | POA: Diagnosis not present

## 2021-08-17 DIAGNOSIS — A419 Sepsis, unspecified organism: Secondary | ICD-10-CM | POA: Diagnosis not present

## 2021-08-17 NOTE — Telephone Encounter (Signed)
2nd attempt - called patient to schedule per 3/24 sch msg , follow up with Dr.Ennever - patient answerd but would like to call us back later today , he did not have his schedule  ?

## 2021-08-18 ENCOUNTER — Other Ambulatory Visit (HOSPITAL_COMMUNITY): Payer: Self-pay

## 2021-08-18 ENCOUNTER — Telehealth: Payer: Self-pay | Admitting: *Deleted

## 2021-08-18 DIAGNOSIS — N390 Urinary tract infection, site not specified: Secondary | ICD-10-CM | POA: Diagnosis not present

## 2021-08-18 DIAGNOSIS — E119 Type 2 diabetes mellitus without complications: Secondary | ICD-10-CM | POA: Diagnosis not present

## 2021-08-18 DIAGNOSIS — I10 Essential (primary) hypertension: Secondary | ICD-10-CM | POA: Diagnosis not present

## 2021-08-18 DIAGNOSIS — A419 Sepsis, unspecified organism: Secondary | ICD-10-CM | POA: Diagnosis not present

## 2021-08-18 DIAGNOSIS — F0390 Unspecified dementia without behavioral disturbance: Secondary | ICD-10-CM | POA: Diagnosis not present

## 2021-08-18 DIAGNOSIS — R5381 Other malaise: Secondary | ICD-10-CM | POA: Diagnosis not present

## 2021-08-18 NOTE — Telephone Encounter (Signed)
Per scheduling message Ronald Rice - called and gave upcoming appointments - confirmed 

## 2021-08-19 ENCOUNTER — Other Ambulatory Visit (HOSPITAL_COMMUNITY): Payer: Self-pay

## 2021-08-19 DIAGNOSIS — R7881 Bacteremia: Secondary | ICD-10-CM | POA: Diagnosis not present

## 2021-08-19 DIAGNOSIS — R2681 Unsteadiness on feet: Secondary | ICD-10-CM | POA: Diagnosis not present

## 2021-08-19 DIAGNOSIS — N390 Urinary tract infection, site not specified: Secondary | ICD-10-CM | POA: Diagnosis not present

## 2021-08-19 DIAGNOSIS — E1142 Type 2 diabetes mellitus with diabetic polyneuropathy: Secondary | ICD-10-CM | POA: Diagnosis not present

## 2021-08-19 DIAGNOSIS — C8300 Small cell B-cell lymphoma, unspecified site: Secondary | ICD-10-CM | POA: Diagnosis not present

## 2021-08-20 DIAGNOSIS — G4733 Obstructive sleep apnea (adult) (pediatric): Secondary | ICD-10-CM | POA: Diagnosis not present

## 2021-08-20 DIAGNOSIS — I10 Essential (primary) hypertension: Secondary | ICD-10-CM | POA: Diagnosis not present

## 2021-08-20 DIAGNOSIS — R5381 Other malaise: Secondary | ICD-10-CM | POA: Diagnosis not present

## 2021-08-20 DIAGNOSIS — N400 Enlarged prostate without lower urinary tract symptoms: Secondary | ICD-10-CM | POA: Diagnosis not present

## 2021-08-20 DIAGNOSIS — E119 Type 2 diabetes mellitus without complications: Secondary | ICD-10-CM | POA: Diagnosis not present

## 2021-08-24 DIAGNOSIS — C8300 Small cell B-cell lymphoma, unspecified site: Secondary | ICD-10-CM | POA: Diagnosis not present

## 2021-08-24 DIAGNOSIS — E1142 Type 2 diabetes mellitus with diabetic polyneuropathy: Secondary | ICD-10-CM | POA: Diagnosis not present

## 2021-08-24 DIAGNOSIS — R2681 Unsteadiness on feet: Secondary | ICD-10-CM | POA: Diagnosis not present

## 2021-08-24 DIAGNOSIS — N390 Urinary tract infection, site not specified: Secondary | ICD-10-CM | POA: Diagnosis not present

## 2021-08-24 DIAGNOSIS — R7881 Bacteremia: Secondary | ICD-10-CM | POA: Diagnosis not present

## 2021-08-25 DIAGNOSIS — G4733 Obstructive sleep apnea (adult) (pediatric): Secondary | ICD-10-CM | POA: Diagnosis not present

## 2021-08-25 DIAGNOSIS — F0390 Unspecified dementia without behavioral disturbance: Secondary | ICD-10-CM | POA: Diagnosis not present

## 2021-08-25 DIAGNOSIS — I10 Essential (primary) hypertension: Secondary | ICD-10-CM | POA: Diagnosis not present

## 2021-08-25 DIAGNOSIS — N390 Urinary tract infection, site not specified: Secondary | ICD-10-CM | POA: Diagnosis not present

## 2021-08-25 DIAGNOSIS — E119 Type 2 diabetes mellitus without complications: Secondary | ICD-10-CM | POA: Diagnosis not present

## 2021-08-25 DIAGNOSIS — A419 Sepsis, unspecified organism: Secondary | ICD-10-CM | POA: Diagnosis not present

## 2021-08-25 DIAGNOSIS — C8590 Non-Hodgkin lymphoma, unspecified, unspecified site: Secondary | ICD-10-CM | POA: Diagnosis not present

## 2021-08-25 DIAGNOSIS — N400 Enlarged prostate without lower urinary tract symptoms: Secondary | ICD-10-CM | POA: Diagnosis not present

## 2021-08-26 DIAGNOSIS — R7881 Bacteremia: Secondary | ICD-10-CM | POA: Diagnosis not present

## 2021-08-26 DIAGNOSIS — N390 Urinary tract infection, site not specified: Secondary | ICD-10-CM | POA: Diagnosis not present

## 2021-08-26 DIAGNOSIS — C8300 Small cell B-cell lymphoma, unspecified site: Secondary | ICD-10-CM | POA: Diagnosis not present

## 2021-08-26 DIAGNOSIS — R2681 Unsteadiness on feet: Secondary | ICD-10-CM | POA: Diagnosis not present

## 2021-08-26 DIAGNOSIS — E1142 Type 2 diabetes mellitus with diabetic polyneuropathy: Secondary | ICD-10-CM | POA: Diagnosis not present

## 2021-08-31 ENCOUNTER — Other Ambulatory Visit: Payer: Self-pay

## 2021-08-31 NOTE — Patient Outreach (Deleted)
[facility]) Care Management  ? ?08/31/2021 ? ?Deanna Artis ?1946-01-06 ?[id number] ? ?Cordai Rodrigue is an 76 y.o. male ? ?Subjective:  ? ?Objective:   ?Review of Systems ? ?Physical Exam ? ?Encounter Medications:   ?Outpatient Encounter Medications as of 08/31/2021  ?Medication Sig Note  ? acalabrutinib (CALQUENCE) 100 MG capsule Take 1 capsule (100 mg total) by mouth 2 (two) times daily.   ? Acalabrutinib Maleate (CALQUENCE) 100 MG TABS Take 1 tablet (100 mg) by mouth in the morning and at bedtime.   ? acetaminophen (TYLENOL) 500 MG tablet Take 1,000 mg by mouth every 6 (six) hours as needed (pain).    ? allopurinol (ZYLOPRIM) 300 MG tablet Take 300 mg by mouth daily.   ? aspirin EC 81 MG tablet Take 81 mg by mouth daily.   ? atenolol (TENORMIN) 25 MG tablet Take 25 mg by mouth daily.    ? atorvastatin (LIPITOR) 40 MG tablet Take 40 mg by mouth daily.   ? chlorthalidone (HYGROTON) 25 MG tablet Take 25 mg by mouth daily with breakfast.   ? Cholecalciferol (VITAMIN D3 PO) Take 1 tablet by mouth daily.   ? CINNAMON PO Take 1 tablet by mouth daily.   ? diclofenac sodium (VOLTAREN) 1 % GEL Apply 1 application topically as needed (pain).    ? donepezil (ARICEPT ODT) 5 MG disintegrating tablet Take 5 mg by mouth at bedtime.   ? DULoxetine (CYMBALTA) 60 MG capsule TAKE 1 CAPSULE(60 MG) BY MOUTH DAILY   ? famotidine (PEPCID) 20 MG tablet Take 20 mg by mouth at bedtime as needed for heartburn. (Patient not taking: No sig reported)   ? ipratropium (ATROVENT) 0.06 % nasal spray Place 1 spray into both nostrils daily as needed for rhinitis.  (Patient not taking: No sig reported)   ? Lancets (ONETOUCH DELICA PLUS [id number]) MISC    ? lidocaine-prilocaine (EMLA) cream Apply to PAC 1 hour prior to procedure. 08/07/2019: 08/07/19 - as needed  ? lisinopril (ZESTRIL) 40 MG tablet Take 1 tablet (40 mg total) by mouth daily for 30 days.   ? loratadine (CLARITIN) 10 MG tablet Take 10 mg by mouth as needed. (Patient not  taking: No sig reported)   ? metFORMIN (GLUCOPHAGE-XR) 500 MG 24 hr tablet Take 500 mg by mouth every evening.    ? Na Sulfate-K Sulfate-Mg Sulf 17.5-3.13-1.6 GM/177ML SOLN Suprep (no substitutions)-TAKE AS DIRECTED. (Patient not taking: No sig reported)   ? potassium chloride (K-DUR) 10 MEQ tablet Take 10 mEq by mouth daily.    ? pregabalin (LYRICA) 75 MG capsule Take 75 mg by mouth 2 (two) times daily.   ? pyridoxine (B-6) 250 MG tablet Take 250 mg by mouth daily.   ? Semaglutide,0.25 or 0.'5MG'$ /DOS, (OZEMPIC, 0.25 OR 0.5 MG/DOSE,) 2 MG/1.5ML SOPN once a week. 12/01/2020 ?0.25.   ? tadalafil (CIALIS) 20 MG tablet Take 20 mg by mouth as needed.   ? tamsulosin (FLOMAX) 0.4 MG CAPS capsule Take 0.4 mg by mouth daily.   ? traMADol (ULTRAM) 50 MG tablet TAKE 1 TABLET BY MOUTH EVERY 6 HOURS AS NEEDED (Patient taking differently: Take 50 mg by mouth every 6 (six) hours as needed for moderate pain.)   ? venetoclax (VENCLEXTA) 100 MG tablet Take 1 tablet (100 mg total) by mouth daily. Tablets should be swallowed whole with a meal and a full glass of water.   ? ?No facility-administered encounter medications on file as of 08/31/2021.  ? ? ?Functional Status:   ?   ?  View : No data to display.  ?  ?  ?  ? ? ?Fall/Depression Screening:    ? ?  09/17/2019  ?  1:35 PM  ?Fall Risk   ?Falls in the past year? 1  ?Number falls in past yr: 1  ?Injury with Fall? 1  ? ?   ? View : No data to display.  ?  ?  ?  ? ? ?Assessment:   ? Goals Addressed   ?None ?  ? ? ?Plan:  ?Follow-up: {FOLLOWUP:25645} ? ? ? ? ?

## 2021-08-31 NOTE — Patient Outreach (Signed)
[facility]) Care Management ? ?08/31/2021 ? ?Deanna Artis ?12/11/1945 ?[id number] ? ? ? ? ?Transition of Care Referral ? ?Referral Date: 08/31/2021 ?Referral Source:Discharge Report ?Date of Discharge: 08/26/2021 ?[place] ? ? ? ? ?Outreach attempt # 1 to patient. No answer after several rings. ? ? ? ?Plan: ?RN CM will make outreach attempt within 4 business days. ?RN CM will send unsuccessful letter. ? ? ?Enzo Montgomery, RN,BSN,CCM ?[facility] Care Management ?Telephonic Care Management Coordinator ?Direct Phone: [phone] ?Toll Free: [phone] ?Fax: [phone] ? ?

## 2021-08-31 NOTE — Patient Outreach (Signed)
[facility]) Care Management ? ?08/31/2021 ? ?Deanna Artis ?[birth year] ?[id number] ? ?[employer] member discharged from SNF. Request assigned to Enzo Montgomery, RN for transition of care follow up. ? ?Ina Homes ?[facility]-Care Management Assistant ?[phone]  ? ?

## 2021-09-01 ENCOUNTER — Other Ambulatory Visit: Payer: Self-pay

## 2021-09-01 NOTE — Patient Outreach (Signed)
[place] [facility]) Care Management ? ?09/01/2021 ? ?Ronald Rice ?July 10, 1945 ?[id number] ? ? ?Transition of Care Referral ?  ?Referral Date: 08/31/2021 ?Referral Source:Discharge Report ?Date of Discharge: 08/26/2021 ?[place] ? ? ? ?Unsuccessful outreach attempt # 2 to patient. ? ? ? ? ? ?Plan: ?RN CM will make outreach attempt within 4 business days.  ? ? ?Enzo Montgomery, RN,BSN,CCM ?[facility] Care Management ?Telephonic Care Management Coordinator ?Direct Phone: [phone] ?Toll Free: [phone] ?Fax: [phone] ? ?

## 2021-09-03 ENCOUNTER — Other Ambulatory Visit: Payer: Self-pay

## 2021-09-03 NOTE — Patient Outreach (Signed)
[place] [facility]) Care Management ? ?09/03/2021 ? ?Ronald Rice ?04-23-1946 ?[id number] ? ? ?Transition of Care Referral ?  ?Referral Date: 08/31/2021 ?Referral Source:Discharge Report ?Date of Discharge: 08/26/2021 ?[place] ? ? ?Unsuccessful outreach attempt to patient. ? ? ? ? ? ?Plan: ?RN CM will make outreach attempt to patient within 3-4 wks if no response from letter mailed to patient. ? ?Enzo Montgomery, RN,BSN,CCM ?[facility] Care Management ?Telephonic Care Management Coordinator ?Direct Phone: [phone] ?Toll Free: [phone] ?Fax: [phone] ? ?

## 2021-09-07 ENCOUNTER — Other Ambulatory Visit (HOSPITAL_COMMUNITY): Payer: Self-pay

## 2021-09-08 DIAGNOSIS — E785 Hyperlipidemia, unspecified: Secondary | ICD-10-CM | POA: Diagnosis not present

## 2021-09-08 DIAGNOSIS — E1140 Type 2 diabetes mellitus with diabetic neuropathy, unspecified: Secondary | ICD-10-CM | POA: Diagnosis not present

## 2021-09-08 DIAGNOSIS — I10 Essential (primary) hypertension: Secondary | ICD-10-CM | POA: Diagnosis not present

## 2021-09-08 DIAGNOSIS — C9190 Lymphoid leukemia, unspecified not having achieved remission: Secondary | ICD-10-CM | POA: Diagnosis not present

## 2021-09-10 ENCOUNTER — Other Ambulatory Visit (HOSPITAL_COMMUNITY): Payer: Self-pay

## 2021-09-11 ENCOUNTER — Other Ambulatory Visit (HOSPITAL_COMMUNITY): Payer: Self-pay

## 2021-09-14 ENCOUNTER — Inpatient Hospital Stay: Payer: Medicare HMO

## 2021-09-14 ENCOUNTER — Inpatient Hospital Stay: Payer: Medicare HMO | Admitting: Hematology & Oncology

## 2021-09-21 DIAGNOSIS — E1142 Type 2 diabetes mellitus with diabetic polyneuropathy: Secondary | ICD-10-CM | POA: Diagnosis not present

## 2021-09-21 DIAGNOSIS — I129 Hypertensive chronic kidney disease with stage 1 through stage 4 chronic kidney disease, or unspecified chronic kidney disease: Secondary | ICD-10-CM | POA: Diagnosis not present

## 2021-09-21 DIAGNOSIS — E1122 Type 2 diabetes mellitus with diabetic chronic kidney disease: Secondary | ICD-10-CM | POA: Diagnosis not present

## 2021-09-21 DIAGNOSIS — N189 Chronic kidney disease, unspecified: Secondary | ICD-10-CM | POA: Diagnosis not present

## 2021-09-22 ENCOUNTER — Other Ambulatory Visit: Payer: Self-pay | Admitting: Pharmacist

## 2021-09-24 ENCOUNTER — Other Ambulatory Visit: Payer: Self-pay

## 2021-09-24 NOTE — Patient Outreach (Signed)
[place]) Care Management ? ?09/24/2021 ? ?Deanna Artis ?01-19-1946 ?[id number] ? ? ?Transition of Care Referral ?  ?Referral Date: 08/31/2021 ?Referral Source:Discharge Report ?Date of Discharge: 08/26/2021 ?[place] ?  ? ? ?Multiple attempts to establish contact with patient without success. No response from letter mailed to patient. Case is being closed at this time.  ? ? ?Plan: ?RN CM will close case. ? ?Enzo Montgomery, RN,BSN,CCM ?[facility] Care Management ?Telephonic Care Management Coordinator ?Direct Phone: [phone] ?Toll Free: [phone] ?Fax: [phone] ? ?

## 2021-09-30 ENCOUNTER — Inpatient Hospital Stay: Payer: Medicare HMO | Admitting: Hematology & Oncology

## 2021-09-30 ENCOUNTER — Inpatient Hospital Stay: Payer: Medicare HMO | Attending: Hematology & Oncology

## 2021-09-30 ENCOUNTER — Encounter: Payer: Self-pay | Admitting: Hematology & Oncology

## 2021-09-30 ENCOUNTER — Other Ambulatory Visit: Payer: Self-pay

## 2021-09-30 ENCOUNTER — Inpatient Hospital Stay: Payer: Medicare HMO

## 2021-09-30 VITALS — BP 125/76 | HR 77 | Temp 98.0°F | Resp 19 | Ht 74.02 in | Wt 281.0 lb

## 2021-09-30 DIAGNOSIS — C9110 Chronic lymphocytic leukemia of B-cell type not having achieved remission: Secondary | ICD-10-CM

## 2021-09-30 DIAGNOSIS — E1140 Type 2 diabetes mellitus with diabetic neuropathy, unspecified: Secondary | ICD-10-CM | POA: Insufficient documentation

## 2021-09-30 DIAGNOSIS — Z8572 Personal history of non-Hodgkin lymphomas: Secondary | ICD-10-CM | POA: Insufficient documentation

## 2021-09-30 DIAGNOSIS — C9112 Chronic lymphocytic leukemia of B-cell type in relapse: Secondary | ICD-10-CM | POA: Diagnosis not present

## 2021-09-30 DIAGNOSIS — Z452 Encounter for adjustment and management of vascular access device: Secondary | ICD-10-CM | POA: Insufficient documentation

## 2021-09-30 LAB — CBC WITH DIFFERENTIAL (CANCER CENTER ONLY)
Abs Immature Granulocytes: 0.03 10*3/uL (ref 0.00–0.07)
Basophils Absolute: 0 10*3/uL (ref 0.0–0.1)
Basophils Relative: 1 %
Eosinophils Absolute: 0.1 10*3/uL (ref 0.0–0.5)
Eosinophils Relative: 2 %
HCT: 37.7 % — ABNORMAL LOW (ref 39.0–52.0)
Hemoglobin: 13.4 g/dL (ref 13.0–17.0)
Immature Granulocytes: 1 %
Lymphocytes Relative: 48 %
Lymphs Abs: 2 10*3/uL (ref 0.7–4.0)
MCH: 30.3 pg (ref 26.0–34.0)
MCHC: 35.5 g/dL (ref 30.0–36.0)
MCV: 85.3 fL (ref 80.0–100.0)
Monocytes Absolute: 0.5 10*3/uL (ref 0.1–1.0)
Monocytes Relative: 11 %
Neutro Abs: 1.5 10*3/uL — ABNORMAL LOW (ref 1.7–7.7)
Neutrophils Relative %: 37 %
Platelet Count: 144 10*3/uL — ABNORMAL LOW (ref 150–400)
RBC: 4.42 MIL/uL (ref 4.22–5.81)
RDW: 13.2 % (ref 11.5–15.5)
WBC Count: 4.1 10*3/uL (ref 4.0–10.5)
nRBC: 0 % (ref 0.0–0.2)

## 2021-09-30 LAB — CMP (CANCER CENTER ONLY)
ALT: 23 U/L (ref 0–44)
AST: 18 U/L (ref 15–41)
Albumin: 4.3 g/dL (ref 3.5–5.0)
Alkaline Phosphatase: 48 U/L (ref 38–126)
Anion gap: 11 (ref 5–15)
BUN: 13 mg/dL (ref 8–23)
CO2: 28 mmol/L (ref 22–32)
Calcium: 9.3 mg/dL (ref 8.9–10.3)
Chloride: 103 mmol/L (ref 98–111)
Creatinine: 1.15 mg/dL (ref 0.61–1.24)
GFR, Estimated: >60 mL/min (ref >60)
Glucose, Bld: 189 mg/dL — ABNORMAL HIGH (ref 70–99)
Potassium: 3.4 mmol/L — ABNORMAL LOW (ref 3.5–5.1)
Sodium: 142 mmol/L (ref 135–145)
Total Bilirubin: 0.5 mg/dL (ref 0.3–1.2)
Total Protein: 6.4 g/dL — ABNORMAL LOW (ref 6.5–8.1)

## 2021-09-30 LAB — URIC ACID: Uric Acid, Serum: 8.2 mg/dL (ref 3.7–8.6)

## 2021-09-30 LAB — LACTATE DEHYDROGENASE: LDH: 201 U/L — ABNORMAL HIGH (ref 98–192)

## 2021-09-30 LAB — SAVE SMEAR(SSMR), FOR PROVIDER SLIDE REVIEW

## 2021-09-30 NOTE — Progress Notes (Signed)
?Hematology and Oncology Follow Up Visit ? ?Ronald Rice ?[id number] ?July 29, 1945 76 y.o. ?09/30/2021 ? ? ?Principle Diagnosis:  ?Diffuse large cell non-Hodgkin's lymphoma-Richter's transformation from CLL ?CLL recurrence ? ?Current Therapy:   ?R-CHOP-s/p cycle #8-- started on 11/08/2018 ?XRT to the LEFT neck -- 4500 rad -- completed on 08/01/2019 ?Acalabrutinib 100 mg po BID -- start on 11/04/2020 ?Venetoclax 100 mg po q day -- start on 06/06/2021 ?    ?Interim History:  Ronald Rice is back for follow-up.  We last saw him back in January.  Since then, he has been doing okay.  He is on the acalabrutinib.  Unfortunately, I cannot get the venetoclax approved for him. ? ?He still has a myriad of other health issues.  I think diabetes is probably his biggest problem. ? ?He still gets around with a rolling walker.  He had surgery for the left leg.  There is little bit of swelling with the left leg. ? ?He has had no swollen lymph nodes.  He has had no problems with fever.  He has had no rashes.  He has had no cough. ? ?His last PET scan was done about a year ago.  I think we really need to get another one on him to see how well he is responding. ? ?He has had some diarrhea.  I think this is more so from his diabetes. ? ?He does have neuropathy.  Again, I suspect this is probably from his diabetes. ? ?Overall, I would say his performance status is probably ECOG 2.   ? ?Medications:  ?Current Outpatient Medications:  ?  acalabrutinib (CALQUENCE) 100 MG capsule, Take 1 capsule (100 mg total) by mouth 2 (two) times daily., Disp: 60 capsule, Rfl: 6 ?  Acalabrutinib Maleate (CALQUENCE) 100 MG TABS, Take 1 tablet (100 mg) by mouth in the morning and at bedtime., Disp: 60 tablet, Rfl: 6 ?  acetaminophen (TYLENOL) 500 MG tablet, Take 1,000 mg by mouth every 6 (six) hours as needed (pain). , Disp: , Rfl:  ?  allopurinol (ZYLOPRIM) 300 MG tablet, Take 300 mg by mouth daily., Disp: , Rfl:  ?  aspirin EC 81 MG tablet, Take 81 mg by mouth  daily., Disp: , Rfl:  ?  atenolol (TENORMIN) 25 MG tablet, Take 25 mg by mouth daily. , Disp: , Rfl:  ?  atorvastatin (LIPITOR) 40 MG tablet, Take 40 mg by mouth daily., Disp: , Rfl:  ?  chlorthalidone (HYGROTON) 25 MG tablet, Take 25 mg by mouth daily with breakfast., Disp: , Rfl:  ?  Cholecalciferol (VITAMIN D3 PO), Take 1 tablet by mouth daily., Disp: , Rfl:  ?  CINNAMON PO, Take 1 tablet by mouth daily., Disp: , Rfl:  ?  diclofenac sodium (VOLTAREN) 1 % GEL, Apply 1 application topically as needed (pain). , Disp: , Rfl:  ?  donepezil (ARICEPT ODT) 5 MG disintegrating tablet, Take 5 mg by mouth at bedtime., Disp: , Rfl:  ?  DULoxetine (CYMBALTA) 60 MG capsule, TAKE 1 CAPSULE(60 MG) BY MOUTH DAILY, Disp: 90 capsule, Rfl: 3 ?  famotidine (PEPCID) 20 MG tablet, Take 20 mg by mouth at bedtime as needed for heartburn., Disp: , Rfl:  ?  ipratropium (ATROVENT) 0.06 % nasal spray, Place 1 spray into both nostrils daily as needed for rhinitis., Disp: , Rfl:  ?  Lancets (ONETOUCH DELICA PLUS [id number]) MISC, , Disp: , Rfl:  ?  lisinopril (ZESTRIL) 40 MG tablet, Take 1 tablet (40 mg total) by mouth daily for  30 days., Disp: 30 tablet, Rfl: 0 ?  loratadine (CLARITIN) 10 MG tablet, Take 10 mg by mouth as needed., Disp: , Rfl:  ?  metFORMIN (GLUCOPHAGE-XR) 500 MG 24 hr tablet, Take 500 mg by mouth every evening. , Disp: , Rfl:  ?  Na Sulfate-K Sulfate-Mg Sulf 17.5-3.13-1.6 GM/177ML SOLN, Suprep (no substitutions)-TAKE AS DIRECTED., Disp: 354 mL, Rfl: 0 ?  potassium chloride (K-DUR) 10 MEQ tablet, Take 10 mEq by mouth daily. , Disp: , Rfl:  ?  pregabalin (LYRICA) 75 MG capsule, Take 75 mg by mouth 2 (two) times daily., Disp: , Rfl:  ?  pyridoxine (B-6) 250 MG tablet, Take 250 mg by mouth daily., Disp: , Rfl:  ?  Semaglutide,0.25 or 0.5MG /DOS, (OZEMPIC, 0.25 OR 0.5 MG/DOSE,) 2 MG/1.5ML SOPN, once a week. 12/01/2020 0.25., Disp: , Rfl:  ?  tadalafil (CIALIS) 20 MG tablet, Take 20 mg by mouth as needed., Disp: , Rfl:  ?  tamsulosin  (FLOMAX) 0.4 MG CAPS capsule, Take 0.4 mg by mouth daily., Disp: , Rfl:  ?  traMADol (ULTRAM) 50 MG tablet, TAKE 1 TABLET BY MOUTH EVERY 6 HOURS AS NEEDED (Patient taking differently: Take 50 mg by mouth every 6 (six) hours as needed for moderate pain.), Disp: 90 tablet, Rfl: 0 ?  venetoclax (VENCLEXTA) 100 MG tablet, Take 1 tablet (100 mg total) by mouth daily. Tablets should be swallowed whole with a meal and a full glass of water., Disp: 30 tablet, Rfl: 6 ? ?Allergies:  ?No Known Allergies ? ?Past Medical History, Surgical history, Social history, and Family History were reviewed and updated. ? ?Review of Systems: ?Review of Systems  ?Constitutional: Negative.   ?HENT:  Negative.    ?Eyes: Negative.   ?Respiratory: Negative.    ?Cardiovascular: Negative.   ?Gastrointestinal: Negative.   ?Endocrine: Negative.   ?Genitourinary:  Positive for nocturia.   ?Musculoskeletal:  Positive for arthralgias, gait problem and myalgias.  ?Neurological:  Positive for gait problem.  ?Hematological: Negative.   ?Psychiatric/Behavioral: Negative.    ? ?Physical Exam: ? height is 6' 2.02" (1.88 m) and weight is 281 lb (127.5 kg). His oral temperature is 98 ?F (36.7 ?C). His blood pressure is 125/76 and his pulse is 77. His respiration is 19 and oxygen saturation is 100%.  ? ?Wt Readings from Last 3 Encounters:  ?09/30/21 281 lb (127.5 kg)  ?05/28/21 274 lb (124.3 kg)  ?12/01/20 271 lb (122.9 kg)  ? ? ?Physical Exam ?Vitals reviewed.  ?HENT:  ?   Head: Normocephalic and atraumatic.  ?Eyes:  ?   Pupils: Pupils are equal, round, and reactive to light.  ?Neck:  ?   Comments: Examination of his neck shows no obvious palpable adenopathy.  He has some changes from radiation on the left side of the neck.  I cannot palpate any supraclavicular lymph nodes.   ?Cardiovascular:  ?   Rate and Rhythm: Normal rate and regular rhythm.  ?   Heart sounds: Normal heart sounds.  ?Pulmonary:  ?   Effort: Pulmonary effort is normal.  ?   Breath sounds:  Normal breath sounds.  ?Abdominal:  ?   General: Bowel sounds are normal.  ?   Palpations: Abdomen is soft.  ?Musculoskeletal:     ?   General: No tenderness or deformity. Normal range of motion.  ?Lymphadenopathy:  ?   Cervical: No cervical adenopathy.  ?Skin: ?   General: Skin is warm and dry.  ?   Findings: No erythema or rash.  ?  Neurological:  ?   Mental Status: He is alert and oriented to person, place, and time.  ?Psychiatric:     ?   Behavior: Behavior normal.     ?   Thought Content: Thought content normal.     ?   Judgment: Judgment normal.  ? ?Lab Results  ?Component Value Date  ? WBC 4.1 09/30/2021  ? HGB 13.4 09/30/2021  ? HCT 37.7 (L) 09/30/2021  ? MCV 85.3 09/30/2021  ? PLT 144 (L) 09/30/2021  ? ?  Chemistry   ?   ?Component Value Date/Time  ? NA 142 09/30/2021 0815  ? NA 142 01/25/2017 1217  ? K 3.4 (L) 09/30/2021 0815  ? K 4.1 01/25/2017 1217  ? CL 103 09/30/2021 0815  ? CL 102 01/25/2017 1217  ? CO2 28 09/30/2021 0815  ? CO2 33 01/25/2017 1217  ? BUN 13 09/30/2021 0815  ? BUN 12 01/25/2017 1217  ? CREATININE 1.15 09/30/2021 0815  ? CREATININE 1.4 (H) 01/25/2017 1217  ?    ?Component Value Date/Time  ? CALCIUM 9.3 09/30/2021 0815  ? CALCIUM 9.7 01/25/2017 1217  ? ALKPHOS 48 09/30/2021 0815  ? ALKPHOS 59 01/25/2017 1217  ? AST 18 09/30/2021 0815  ? ALT 23 09/30/2021 0815  ? ALT 32 01/25/2017 1217  ? BILITOT 0.5 09/30/2021 0815  ?  ? ? ?Impression and Plan: ?Ronald Rice is a 76 year old African-American male.  He was diagnosed with CLL back in [place].  He then had transformation to a large cell non-Hodgkin's lymphoma.  He was treated with chemotherapy followed by radiation therapy to the neck given that he had bulky disease.  ? ?He completed his chemotherapy back in December 2020.  He then underwent radiation therapy which he completed I think in early February 2021. ? ?He now has recurrence of the CLL.  He is on acalabrutinib.  Unfortunately, I just cannot get him on venetoclax.  I think would  really help along with the acalabrutinib. ? ?We will go ahead and get a PET scan on him.  I think this would be very helpful so we can see how well he is responding.  By his labs, he looks great.  I will do

## 2021-09-30 NOTE — Patient Instructions (Signed)

## 2021-10-05 ENCOUNTER — Other Ambulatory Visit (HOSPITAL_COMMUNITY): Payer: Self-pay

## 2021-10-12 ENCOUNTER — Other Ambulatory Visit: Payer: Self-pay

## 2021-10-12 ENCOUNTER — Telehealth: Payer: Self-pay | Admitting: Pharmacy Technician

## 2021-10-12 ENCOUNTER — Other Ambulatory Visit (HOSPITAL_COMMUNITY): Payer: Self-pay

## 2021-10-12 DIAGNOSIS — C9110 Chronic lymphocytic leukemia of B-cell type not having achieved remission: Secondary | ICD-10-CM

## 2021-10-12 MED ORDER — CALQUENCE 100 MG PO CAPS: 100.0000 mg | ORAL_CAPSULE | Freq: Two times a day (BID) | ORAL | 6 refills | Status: DC

## 2021-10-12 MED ORDER — CALQUENCE 100 MG PO TABS: 100.0000 mg | ORAL_TABLET | Freq: Two times a day (BID) | ORAL | 6 refills | Status: DC

## 2021-10-12 NOTE — Telephone Encounter (Signed)
Oral Oncology Patient Advocate Encounter   Received notification from [facility] that the existing prior authorization for Calquence is due for renewal.   Renewal PA submitted on CoverMyMeds Key [id number] Status is pending   Oral Oncology Clinic will continue to follow.  [place] Patient [place] Phone [phone] Fax [phone] 10/12/2021 9:19 AM

## 2021-10-12 NOTE — Telephone Encounter (Signed)
Oral Oncology Patient Advocate Encounter  Prior Authorization for Calquence has been approved.    PA# [id number] Effective dates: 05/24/21 through 05/23/22  Oral Oncology Clinic will continue to follow.    [place] Patient [place] Phone [phone] Fax [phone] 10/12/2021 9:52 AM

## 2021-10-14 ENCOUNTER — Other Ambulatory Visit (HOSPITAL_COMMUNITY): Payer: Self-pay

## 2021-10-15 ENCOUNTER — Other Ambulatory Visit (HOSPITAL_COMMUNITY): Payer: Self-pay

## 2021-10-21 DIAGNOSIS — B349 Viral infection, unspecified: Secondary | ICD-10-CM | POA: Diagnosis not present

## 2021-10-21 DIAGNOSIS — R059 Cough, unspecified: Secondary | ICD-10-CM | POA: Diagnosis not present

## 2021-10-21 DIAGNOSIS — N401 Enlarged prostate with lower urinary tract symptoms: Secondary | ICD-10-CM | POA: Diagnosis not present

## 2021-10-21 DIAGNOSIS — Z20822 Contact with and (suspected) exposure to covid-19: Secondary | ICD-10-CM | POA: Diagnosis not present

## 2021-10-21 DIAGNOSIS — R29898 Other symptoms and signs involving the musculoskeletal system: Secondary | ICD-10-CM | POA: Diagnosis not present

## 2021-10-21 DIAGNOSIS — F0390 Unspecified dementia without behavioral disturbance: Secondary | ICD-10-CM | POA: Diagnosis not present

## 2021-10-21 DIAGNOSIS — C8590 Non-Hodgkin lymphoma, unspecified, unspecified site: Secondary | ICD-10-CM | POA: Diagnosis not present

## 2021-10-21 DIAGNOSIS — E1142 Type 2 diabetes mellitus with diabetic polyneuropathy: Secondary | ICD-10-CM | POA: Diagnosis not present

## 2021-10-21 DIAGNOSIS — R0902 Hypoxemia: Secondary | ICD-10-CM | POA: Diagnosis not present

## 2021-10-21 DIAGNOSIS — I700 Atherosclerosis of aorta: Secondary | ICD-10-CM | POA: Diagnosis not present

## 2021-10-21 DIAGNOSIS — R531 Weakness: Secondary | ICD-10-CM | POA: Diagnosis not present

## 2021-10-21 DIAGNOSIS — R350 Frequency of micturition: Secondary | ICD-10-CM | POA: Diagnosis not present

## 2021-10-21 DIAGNOSIS — G4733 Obstructive sleep apnea (adult) (pediatric): Secondary | ICD-10-CM | POA: Diagnosis not present

## 2021-10-21 DIAGNOSIS — R410 Disorientation, unspecified: Secondary | ICD-10-CM | POA: Diagnosis not present

## 2021-10-21 DIAGNOSIS — I2510 Atherosclerotic heart disease of native coronary artery without angina pectoris: Secondary | ICD-10-CM | POA: Diagnosis not present

## 2021-10-21 DIAGNOSIS — Z743 Need for continuous supervision: Secondary | ICD-10-CM | POA: Diagnosis not present

## 2021-10-21 DIAGNOSIS — R509 Fever, unspecified: Secondary | ICD-10-CM | POA: Diagnosis not present

## 2021-10-21 DIAGNOSIS — R4182 Altered mental status, unspecified: Secondary | ICD-10-CM | POA: Diagnosis not present

## 2021-10-22 DIAGNOSIS — R350 Frequency of micturition: Secondary | ICD-10-CM | POA: Diagnosis not present

## 2021-10-22 DIAGNOSIS — N401 Enlarged prostate with lower urinary tract symptoms: Secondary | ICD-10-CM | POA: Diagnosis not present

## 2021-10-22 DIAGNOSIS — Z20822 Contact with and (suspected) exposure to covid-19: Secondary | ICD-10-CM | POA: Diagnosis not present

## 2021-10-22 DIAGNOSIS — C9110 Chronic lymphocytic leukemia of B-cell type not having achieved remission: Secondary | ICD-10-CM | POA: Diagnosis not present

## 2021-10-22 DIAGNOSIS — B349 Viral infection, unspecified: Secondary | ICD-10-CM | POA: Diagnosis not present

## 2021-10-22 DIAGNOSIS — E119 Type 2 diabetes mellitus without complications: Secondary | ICD-10-CM | POA: Diagnosis not present

## 2021-10-22 DIAGNOSIS — E785 Hyperlipidemia, unspecified: Secondary | ICD-10-CM | POA: Diagnosis not present

## 2021-10-22 DIAGNOSIS — F0390 Unspecified dementia without behavioral disturbance: Secondary | ICD-10-CM | POA: Diagnosis not present

## 2021-10-22 DIAGNOSIS — E1142 Type 2 diabetes mellitus with diabetic polyneuropathy: Secondary | ICD-10-CM | POA: Diagnosis not present

## 2021-10-22 DIAGNOSIS — I10 Essential (primary) hypertension: Secondary | ICD-10-CM | POA: Diagnosis not present

## 2021-10-22 DIAGNOSIS — G4733 Obstructive sleep apnea (adult) (pediatric): Secondary | ICD-10-CM | POA: Diagnosis not present

## 2021-10-22 DIAGNOSIS — N400 Enlarged prostate without lower urinary tract symptoms: Secondary | ICD-10-CM | POA: Diagnosis not present

## 2021-10-22 DIAGNOSIS — C8590 Non-Hodgkin lymphoma, unspecified, unspecified site: Secondary | ICD-10-CM | POA: Diagnosis not present

## 2021-10-22 DIAGNOSIS — R4182 Altered mental status, unspecified: Secondary | ICD-10-CM | POA: Diagnosis not present

## 2021-10-22 DIAGNOSIS — A419 Sepsis, unspecified organism: Secondary | ICD-10-CM | POA: Diagnosis not present

## 2021-10-23 DIAGNOSIS — Z9989 Dependence on other enabling machines and devices: Secondary | ICD-10-CM | POA: Diagnosis not present

## 2021-10-23 DIAGNOSIS — N401 Enlarged prostate with lower urinary tract symptoms: Secondary | ICD-10-CM | POA: Diagnosis not present

## 2021-10-23 DIAGNOSIS — R3589 Other polyuria: Secondary | ICD-10-CM | POA: Diagnosis not present

## 2021-10-23 DIAGNOSIS — R4182 Altered mental status, unspecified: Secondary | ICD-10-CM | POA: Diagnosis not present

## 2021-10-23 DIAGNOSIS — A419 Sepsis, unspecified organism: Secondary | ICD-10-CM | POA: Diagnosis not present

## 2021-10-23 DIAGNOSIS — E119 Type 2 diabetes mellitus without complications: Secondary | ICD-10-CM | POA: Diagnosis not present

## 2021-10-23 DIAGNOSIS — C8590 Non-Hodgkin lymphoma, unspecified, unspecified site: Secondary | ICD-10-CM | POA: Diagnosis not present

## 2021-10-23 DIAGNOSIS — C9110 Chronic lymphocytic leukemia of B-cell type not having achieved remission: Secondary | ICD-10-CM | POA: Diagnosis not present

## 2021-10-23 DIAGNOSIS — E1142 Type 2 diabetes mellitus with diabetic polyneuropathy: Secondary | ICD-10-CM | POA: Diagnosis not present

## 2021-10-23 DIAGNOSIS — F0390 Unspecified dementia without behavioral disturbance: Secondary | ICD-10-CM | POA: Diagnosis not present

## 2021-10-23 DIAGNOSIS — R350 Frequency of micturition: Secondary | ICD-10-CM | POA: Diagnosis not present

## 2021-10-23 DIAGNOSIS — F0280 Dementia in other diseases classified elsewhere without behavioral disturbance: Secondary | ICD-10-CM | POA: Diagnosis not present

## 2021-10-23 DIAGNOSIS — Z20822 Contact with and (suspected) exposure to covid-19: Secondary | ICD-10-CM | POA: Diagnosis not present

## 2021-10-23 DIAGNOSIS — B349 Viral infection, unspecified: Secondary | ICD-10-CM | POA: Diagnosis not present

## 2021-10-23 DIAGNOSIS — G4733 Obstructive sleep apnea (adult) (pediatric): Secondary | ICD-10-CM | POA: Diagnosis not present

## 2021-10-24 DIAGNOSIS — F0390 Unspecified dementia without behavioral disturbance: Secondary | ICD-10-CM | POA: Diagnosis not present

## 2021-10-24 DIAGNOSIS — F0280 Dementia in other diseases classified elsewhere without behavioral disturbance: Secondary | ICD-10-CM | POA: Diagnosis not present

## 2021-10-24 DIAGNOSIS — G4733 Obstructive sleep apnea (adult) (pediatric): Secondary | ICD-10-CM | POA: Diagnosis not present

## 2021-10-24 DIAGNOSIS — Z9989 Dependence on other enabling machines and devices: Secondary | ICD-10-CM | POA: Diagnosis not present

## 2021-10-24 DIAGNOSIS — C8590 Non-Hodgkin lymphoma, unspecified, unspecified site: Secondary | ICD-10-CM | POA: Diagnosis not present

## 2021-10-24 DIAGNOSIS — R350 Frequency of micturition: Secondary | ICD-10-CM | POA: Diagnosis not present

## 2021-10-24 DIAGNOSIS — E1142 Type 2 diabetes mellitus with diabetic polyneuropathy: Secondary | ICD-10-CM | POA: Diagnosis not present

## 2021-10-24 DIAGNOSIS — Z20822 Contact with and (suspected) exposure to covid-19: Secondary | ICD-10-CM | POA: Diagnosis not present

## 2021-10-24 DIAGNOSIS — B349 Viral infection, unspecified: Secondary | ICD-10-CM | POA: Diagnosis not present

## 2021-10-24 DIAGNOSIS — N401 Enlarged prostate with lower urinary tract symptoms: Secondary | ICD-10-CM | POA: Diagnosis not present

## 2021-10-24 DIAGNOSIS — E119 Type 2 diabetes mellitus without complications: Secondary | ICD-10-CM | POA: Diagnosis not present

## 2021-10-24 DIAGNOSIS — A419 Sepsis, unspecified organism: Secondary | ICD-10-CM | POA: Diagnosis not present

## 2021-10-24 DIAGNOSIS — C9110 Chronic lymphocytic leukemia of B-cell type not having achieved remission: Secondary | ICD-10-CM | POA: Diagnosis not present

## 2021-10-24 DIAGNOSIS — R4182 Altered mental status, unspecified: Secondary | ICD-10-CM | POA: Diagnosis not present

## 2021-10-24 DIAGNOSIS — R3589 Other polyuria: Secondary | ICD-10-CM | POA: Diagnosis not present

## 2021-10-25 DIAGNOSIS — F0280 Dementia in other diseases classified elsewhere without behavioral disturbance: Secondary | ICD-10-CM | POA: Diagnosis not present

## 2021-10-25 DIAGNOSIS — C9110 Chronic lymphocytic leukemia of B-cell type not having achieved remission: Secondary | ICD-10-CM | POA: Diagnosis not present

## 2021-10-25 DIAGNOSIS — R350 Frequency of micturition: Secondary | ICD-10-CM | POA: Diagnosis not present

## 2021-10-25 DIAGNOSIS — B349 Viral infection, unspecified: Secondary | ICD-10-CM | POA: Diagnosis not present

## 2021-10-25 DIAGNOSIS — Z20822 Contact with and (suspected) exposure to covid-19: Secondary | ICD-10-CM | POA: Diagnosis not present

## 2021-10-25 DIAGNOSIS — E119 Type 2 diabetes mellitus without complications: Secondary | ICD-10-CM | POA: Diagnosis not present

## 2021-10-25 DIAGNOSIS — E1142 Type 2 diabetes mellitus with diabetic polyneuropathy: Secondary | ICD-10-CM | POA: Diagnosis not present

## 2021-10-25 DIAGNOSIS — R4182 Altered mental status, unspecified: Secondary | ICD-10-CM | POA: Diagnosis not present

## 2021-10-25 DIAGNOSIS — Z9989 Dependence on other enabling machines and devices: Secondary | ICD-10-CM | POA: Diagnosis not present

## 2021-10-25 DIAGNOSIS — C8590 Non-Hodgkin lymphoma, unspecified, unspecified site: Secondary | ICD-10-CM | POA: Diagnosis not present

## 2021-10-25 DIAGNOSIS — F0390 Unspecified dementia without behavioral disturbance: Secondary | ICD-10-CM | POA: Diagnosis not present

## 2021-10-25 DIAGNOSIS — R3589 Other polyuria: Secondary | ICD-10-CM | POA: Diagnosis not present

## 2021-10-25 DIAGNOSIS — A419 Sepsis, unspecified organism: Secondary | ICD-10-CM | POA: Diagnosis not present

## 2021-10-25 DIAGNOSIS — G4733 Obstructive sleep apnea (adult) (pediatric): Secondary | ICD-10-CM | POA: Diagnosis not present

## 2021-10-25 DIAGNOSIS — N401 Enlarged prostate with lower urinary tract symptoms: Secondary | ICD-10-CM | POA: Diagnosis not present

## 2021-10-26 ENCOUNTER — Telehealth: Payer: Self-pay | Admitting: *Deleted

## 2021-10-26 NOTE — Telephone Encounter (Signed)
Message received from patient's wife stating that patient was admitted for four days at [facility], did not take his Calquence during hospital stay, was discharged yesterday and would like to know if patient should restart Calquence.  Dr. Marin Olp notified and lab results reviewed by Dr. Marin Olp from most recent [place] hospital stay. Call placed back to patient's wife and notified her per order of Dr. Marin Olp to restart Calquence.  Pt.'s wife is appreciative of call back and has no further questions at this time.

## 2021-11-03 DIAGNOSIS — H524 Presbyopia: Secondary | ICD-10-CM | POA: Diagnosis not present

## 2021-11-03 DIAGNOSIS — H35033 Hypertensive retinopathy, bilateral: Secondary | ICD-10-CM | POA: Diagnosis not present

## 2021-11-03 DIAGNOSIS — H5213 Myopia, bilateral: Secondary | ICD-10-CM | POA: Diagnosis not present

## 2021-11-03 DIAGNOSIS — H40013 Open angle with borderline findings, low risk, bilateral: Secondary | ICD-10-CM | POA: Diagnosis not present

## 2021-11-03 DIAGNOSIS — E119 Type 2 diabetes mellitus without complications: Secondary | ICD-10-CM | POA: Diagnosis not present

## 2021-11-03 DIAGNOSIS — H25813 Combined forms of age-related cataract, bilateral: Secondary | ICD-10-CM | POA: Diagnosis not present

## 2021-11-20 ENCOUNTER — Inpatient Hospital Stay: Payer: Medicare HMO | Attending: Hematology & Oncology

## 2021-11-20 ENCOUNTER — Inpatient Hospital Stay: Payer: Medicare HMO | Admitting: Hematology & Oncology

## 2021-11-20 ENCOUNTER — Inpatient Hospital Stay: Payer: Medicare HMO

## 2021-11-20 ENCOUNTER — Encounter: Payer: Self-pay | Admitting: Hematology & Oncology

## 2021-11-20 VITALS — BP 120/75 | HR 79 | Temp 98.0°F | Resp 17 | Ht 72.0 in | Wt 286.0 lb

## 2021-11-20 DIAGNOSIS — Z923 Personal history of irradiation: Secondary | ICD-10-CM | POA: Diagnosis not present

## 2021-11-20 DIAGNOSIS — Z9221 Personal history of antineoplastic chemotherapy: Secondary | ICD-10-CM | POA: Insufficient documentation

## 2021-11-20 DIAGNOSIS — C8338 Diffuse large B-cell lymphoma, lymph nodes of multiple sites: Secondary | ICD-10-CM

## 2021-11-20 DIAGNOSIS — C9112 Chronic lymphocytic leukemia of B-cell type in relapse: Secondary | ICD-10-CM | POA: Insufficient documentation

## 2021-11-20 DIAGNOSIS — C9110 Chronic lymphocytic leukemia of B-cell type not having achieved remission: Secondary | ICD-10-CM

## 2021-11-20 DIAGNOSIS — Z8572 Personal history of non-Hodgkin lymphomas: Secondary | ICD-10-CM | POA: Diagnosis not present

## 2021-11-20 LAB — CMP (CANCER CENTER ONLY)
ALT: 19 U/L (ref 0–44)
AST: 18 U/L (ref 15–41)
Albumin: 4.3 g/dL (ref 3.5–5.0)
Alkaline Phosphatase: 39 U/L (ref 38–126)
Anion gap: 10 (ref 5–15)
BUN: 17 mg/dL (ref 8–23)
CO2: 27 mmol/L (ref 22–32)
Calcium: 9.4 mg/dL (ref 8.9–10.3)
Chloride: 104 mmol/L (ref 98–111)
Creatinine: 1.26 mg/dL — ABNORMAL HIGH (ref 0.61–1.24)
GFR, Estimated: >60 mL/min (ref >60)
Glucose, Bld: 188 mg/dL — ABNORMAL HIGH (ref 70–99)
Potassium: 3.7 mmol/L (ref 3.5–5.1)
Sodium: 141 mmol/L (ref 135–145)
Total Bilirubin: 0.4 mg/dL (ref 0.3–1.2)
Total Protein: 6.4 g/dL — ABNORMAL LOW (ref 6.5–8.1)

## 2021-11-20 LAB — CBC WITH DIFFERENTIAL (CANCER CENTER ONLY)
Abs Immature Granulocytes: 0.03 10*3/uL (ref 0.00–0.07)
Basophils Absolute: 0 10*3/uL (ref 0.0–0.1)
Basophils Relative: 1 %
Eosinophils Absolute: 0.2 10*3/uL (ref 0.0–0.5)
Eosinophils Relative: 4 %
HCT: 37.3 % — ABNORMAL LOW (ref 39.0–52.0)
Hemoglobin: 13.2 g/dL (ref 13.0–17.0)
Immature Granulocytes: 1 %
Lymphocytes Relative: 38 %
Lymphs Abs: 1.7 10*3/uL (ref 0.7–4.0)
MCH: 30 pg (ref 26.0–34.0)
MCHC: 35.4 g/dL (ref 30.0–36.0)
MCV: 84.8 fL (ref 80.0–100.0)
Monocytes Absolute: 0.6 10*3/uL (ref 0.1–1.0)
Monocytes Relative: 13 %
Neutro Abs: 1.9 10*3/uL (ref 1.7–7.7)
Neutrophils Relative %: 43 %
Platelet Count: 155 10*3/uL (ref 150–400)
RBC: 4.4 MIL/uL (ref 4.22–5.81)
RDW: 13.2 % (ref 11.5–15.5)
WBC Count: 4.3 10*3/uL (ref 4.0–10.5)
nRBC: 0 % (ref 0.0–0.2)

## 2021-11-20 LAB — LACTATE DEHYDROGENASE: LDH: 204 U/L — ABNORMAL HIGH (ref 98–192)

## 2021-11-20 LAB — SAVE SMEAR(SSMR), FOR PROVIDER SLIDE REVIEW

## 2021-11-20 NOTE — Patient Instructions (Signed)

## 2021-11-20 NOTE — Progress Notes (Signed)
Hematology and Oncology Follow Up Visit  Rockwell Zentz [id number] 1945-07-17 76 y.o. 11/20/2021   Principle Diagnosis:  Diffuse large cell non-Hodgkin's lymphoma-Richter's transformation from CLL CLL recurrence  Current Therapy:   R-CHOP-s/p cycle #8-- started on 11/08/2018 XRT to the LEFT neck -- 4500 rad -- completed on 08/01/2019 Acalabrutinib 100 mg po BID -- start on 11/04/2020      Interim History:  Mr. Abee is back for follow-up.  He seems to be doing okay.  He said that last night, he fell off his bed.  He was sitting on the side of the bed.  He just slid off.  Thankfully, I do not think he broke anything.  He is doing okay on the acalabrutinib.  The government will not let us utilize venetoclax with this.  I just had the fact that we have to use continuous acalabrutinib.  He has had no problems with the acalabrutinib.  He has had no diarrhea.  He does have neuropathy in his hands which I am sure is from the diabetes.  He has had no bleeding.  There is been no cough or shortness of breath.  He has had no leg swelling.  His wife was listening on the cell phone.  I was able to talk to her and find out what else was going on with him.  It sounds like everything seems to be doing pretty well.  He has had no headache.  There is been no visual changes.  Overall, I would have to think that the big problem is the diabetes.  I think his prognosis is clearly dependent upon his diabetes.  Overall, he has had no fever.  His performance status right now is ECOG 2.      Medications:  Current Outpatient Medications:    Acalabrutinib Maleate (CALQUENCE) 100 MG TABS, Take 1 tablet (100 mg) by mouth in the morning and at bedtime., Disp: 60 tablet, Rfl: 6   acetaminophen (TYLENOL) 500 MG tablet, Take 1,000 mg by mouth every 6 (six) hours as needed (pain). , Disp: , Rfl:    allopurinol (ZYLOPRIM) 300 MG tablet, Take 300 mg by mouth daily., Disp: , Rfl:    aspirin EC 81 MG tablet, Take 81  mg by mouth daily., Disp: , Rfl:    atenolol (TENORMIN) 25 MG tablet, Take 25 mg by mouth daily. , Disp: , Rfl:    atorvastatin (LIPITOR) 40 MG tablet, Take 40 mg by mouth daily., Disp: , Rfl:    chlorthalidone (HYGROTON) 25 MG tablet, Take 25 mg by mouth daily with breakfast., Disp: , Rfl:    Cholecalciferol (VITAMIN D3 PO), Take 1 tablet by mouth daily., Disp: , Rfl:    CINNAMON PO, Take 1 tablet by mouth daily., Disp: , Rfl:    diclofenac sodium (VOLTAREN) 1 % GEL, Apply 1 application topically as needed (pain). , Disp: , Rfl:    donepezil (ARICEPT ODT) 5 MG disintegrating tablet, Take 5 mg by mouth at bedtime., Disp: , Rfl:    DULoxetine (CYMBALTA) 60 MG capsule, TAKE 1 CAPSULE(60 MG) BY MOUTH DAILY, Disp: 90 capsule, Rfl: 3   famotidine (PEPCID) 20 MG tablet, Take 20 mg by mouth at bedtime as needed for heartburn., Disp: , Rfl:    ipratropium (ATROVENT) 0.06 % nasal spray, Place 1 spray into both nostrils daily as needed for rhinitis., Disp: , Rfl:    Lancets (ONETOUCH DELICA PLUS [id number]) MISC, , Disp: , Rfl:    lisinopril (ZESTRIL) 40 MG tablet, Take  1 tablet (40 mg total) by mouth daily for 30 days., Disp: 30 tablet, Rfl: 0   loratadine (CLARITIN) 10 MG tablet, Take 10 mg by mouth as needed., Disp: , Rfl:    metFORMIN (GLUCOPHAGE-XR) 500 MG 24 hr tablet, Take 500 mg by mouth every evening. , Disp: , Rfl:    Na Sulfate-K Sulfate-Mg Sulf 17.5-3.13-1.6 GM/177ML SOLN, Suprep (no substitutions)-TAKE AS DIRECTED., Disp: 354 mL, Rfl: 0   potassium chloride (K-DUR) 10 MEQ tablet, Take 10 mEq by mouth daily. , Disp: , Rfl:    pregabalin (LYRICA) 75 MG capsule, Take 75 mg by mouth 2 (two) times daily., Disp: , Rfl:    pyridoxine (B-6) 250 MG tablet, Take 250 mg by mouth daily., Disp: , Rfl:    Semaglutide,0.25 or 0.'5MG'$ /DOS, (OZEMPIC, 0.25 OR 0.5 MG/DOSE,) 2 MG/1.5ML SOPN, once a week. 12/01/2020 0.25., Disp: , Rfl:    tadalafil (CIALIS) 20 MG tablet, Take 20 mg by mouth as needed., Disp: , Rfl:     tamsulosin (FLOMAX) 0.4 MG CAPS capsule, Take 0.4 mg by mouth daily., Disp: , Rfl:    traMADol (ULTRAM) 50 MG tablet, TAKE 1 TABLET BY MOUTH EVERY 6 HOURS AS NEEDED (Patient taking differently: Take 50 mg by mouth every 6 (six) hours as needed for moderate pain.), Disp: 90 tablet, Rfl: 0   venetoclax (VENCLEXTA) 100 MG tablet, Take 1 tablet (100 mg total) by mouth daily. Tablets should be swallowed whole with a meal and a full glass of water., Disp: 30 tablet, Rfl: 6  Allergies:  No Known Allergies  Past Medical History, Surgical history, Social history, and Family History were reviewed and updated.  Review of Systems: Review of Systems  Constitutional: Negative.   HENT:  Negative.    Eyes: Negative.   Respiratory: Negative.    Cardiovascular: Negative.   Gastrointestinal: Negative.   Endocrine: Negative.   Genitourinary:  Positive for nocturia.   Musculoskeletal:  Positive for arthralgias, gait problem and myalgias.  Neurological:  Positive for gait problem.  Hematological: Negative.   Psychiatric/Behavioral: Negative.      Physical Exam:  height is 6' (1.829 m) and weight is 286 lb (129.7 kg). His oral temperature is 98 F (36.7 C). His blood pressure is 120/75 and his pulse is 79. His respiration is 17 and oxygen saturation is 99%.   Wt Readings from Last 3 Encounters:  11/20/21 286 lb (129.7 kg)  09/30/21 281 lb (127.5 kg)  05/28/21 274 lb (124.3 kg)    Physical Exam Vitals reviewed.  HENT:     Head: Normocephalic and atraumatic.  Eyes:     Pupils: Pupils are equal, round, and reactive to light.  Neck:     Comments: Examination of his neck shows no obvious palpable adenopathy.  He has some changes from radiation on the left side of the neck.  I cannot palpate any supraclavicular lymph nodes.   Cardiovascular:     Rate and Rhythm: Normal rate and regular rhythm.     Heart sounds: Normal heart sounds.  Pulmonary:     Effort: Pulmonary effort is normal.     Breath  sounds: Normal breath sounds.  Abdominal:     General: Bowel sounds are normal.     Palpations: Abdomen is soft.  Musculoskeletal:        General: No tenderness or deformity. Normal range of motion.  Lymphadenopathy:     Cervical: No cervical adenopathy.  Skin:    General: Skin is warm and dry.  Findings: No erythema or rash.  Neurological:     Mental Status: He is alert and oriented to person, place, and time.  Psychiatric:        Behavior: Behavior normal.        Thought Content: Thought content normal.        Judgment: Judgment normal.    Lab Results  Component Value Date   WBC 4.3 11/20/2021   HGB 13.2 11/20/2021   HCT 37.3 (L) 11/20/2021   MCV 84.8 11/20/2021   PLT 155 11/20/2021     Chemistry      Component Value Date/Time   NA 141 11/20/2021 1157   NA 142 01/25/2017 1217   K 3.7 11/20/2021 1157   K 4.1 01/25/2017 1217   CL 104 11/20/2021 1157   CL 102 01/25/2017 1217   CO2 27 11/20/2021 1157   CO2 33 01/25/2017 1217   BUN 17 11/20/2021 1157   BUN 12 01/25/2017 1217   CREATININE 1.26 (H) 11/20/2021 1157   CREATININE 1.4 (H) 01/25/2017 1217      Component Value Date/Time   CALCIUM 9.4 11/20/2021 1157   CALCIUM 9.7 01/25/2017 1217   ALKPHOS 39 11/20/2021 1157   ALKPHOS 59 01/25/2017 1217   AST 18 11/20/2021 1157   ALT 19 11/20/2021 1157   ALT 32 01/25/2017 1217   BILITOT 0.4 11/20/2021 1157      Impression and Plan: Mr. Brander is a 76 year old African-American male.  He was diagnosed with CLL back in [place].  He then had transformation to a large cell non-Hodgkin's lymphoma.  He was treated with chemotherapy followed by radiation therapy to the neck given that he had bulky disease.   He completed his chemotherapy back in December 2020.  He then underwent radiation therapy which he completed I think in early February 2021.  He now has recurrence of the CLL.  He is on acalabrutinib.  His labs look fantastic.  His lymphocytes are down.  I am just  happy that things seem to be going well with this.  I will try to get him now through the Summer.  I would like to see him back after Labor Day.  I think he and his wife will be going down to [place] where they are from.  I do not see a problem with him going down there.  I am just happy that he is doing well and responding to the acalabrutinib.    Volanda Napoleon, MD 6/30/20233:03 PM

## 2021-11-22 LAB — IGG, IGA, IGM
IgA: 454 mg/dL — ABNORMAL HIGH (ref 61–437)
IgG (Immunoglobin G), Serum: 534 mg/dL — ABNORMAL LOW (ref 603–1613)
IgM (Immunoglobulin M), Srm: 34 mg/dL (ref 15–143)

## 2021-11-23 ENCOUNTER — Telehealth: Payer: Self-pay | Admitting: *Deleted

## 2021-11-23 NOTE — Telephone Encounter (Signed)
Received documents from patient for medical records. Sent request to [facility].

## 2021-11-26 ENCOUNTER — Ambulatory Visit (HOSPITAL_COMMUNITY): Payer: Medicare HMO

## 2021-11-28 DIAGNOSIS — N400 Enlarged prostate without lower urinary tract symptoms: Secondary | ICD-10-CM | POA: Diagnosis not present

## 2021-11-28 DIAGNOSIS — Z872 Personal history of diseases of the skin and subcutaneous tissue: Secondary | ICD-10-CM | POA: Diagnosis not present

## 2021-11-28 DIAGNOSIS — E1169 Type 2 diabetes mellitus with other specified complication: Secondary | ICD-10-CM | POA: Diagnosis not present

## 2021-11-28 DIAGNOSIS — R509 Fever, unspecified: Secondary | ICD-10-CM | POA: Diagnosis not present

## 2021-11-28 DIAGNOSIS — C9110 Chronic lymphocytic leukemia of B-cell type not having achieved remission: Secondary | ICD-10-CM | POA: Diagnosis not present

## 2021-11-28 DIAGNOSIS — R410 Disorientation, unspecified: Secondary | ICD-10-CM | POA: Diagnosis not present

## 2021-11-28 DIAGNOSIS — Z743 Need for continuous supervision: Secondary | ICD-10-CM | POA: Diagnosis not present

## 2021-11-28 DIAGNOSIS — R600 Localized edema: Secondary | ICD-10-CM | POA: Diagnosis not present

## 2021-11-28 DIAGNOSIS — Z9989 Dependence on other enabling machines and devices: Secondary | ICD-10-CM | POA: Diagnosis not present

## 2021-11-28 DIAGNOSIS — G4730 Sleep apnea, unspecified: Secondary | ICD-10-CM | POA: Diagnosis not present

## 2021-11-28 DIAGNOSIS — Z20822 Contact with and (suspected) exposure to covid-19: Secondary | ICD-10-CM | POA: Diagnosis not present

## 2021-11-28 DIAGNOSIS — G3281 Cerebellar ataxia in diseases classified elsewhere: Secondary | ICD-10-CM | POA: Diagnosis not present

## 2021-11-28 DIAGNOSIS — C8148 Lymphocyte-rich classical Hodgkin lymphoma, lymph nodes of multiple sites: Secondary | ICD-10-CM | POA: Diagnosis not present

## 2021-11-28 DIAGNOSIS — E785 Hyperlipidemia, unspecified: Secondary | ICD-10-CM | POA: Diagnosis not present

## 2021-11-28 DIAGNOSIS — J189 Pneumonia, unspecified organism: Secondary | ICD-10-CM | POA: Diagnosis not present

## 2021-11-28 DIAGNOSIS — G4733 Obstructive sleep apnea (adult) (pediatric): Secondary | ICD-10-CM | POA: Diagnosis not present

## 2021-11-28 DIAGNOSIS — F0390 Unspecified dementia without behavioral disturbance: Secondary | ICD-10-CM | POA: Diagnosis not present

## 2021-11-28 DIAGNOSIS — I2510 Atherosclerotic heart disease of native coronary artery without angina pectoris: Secondary | ICD-10-CM | POA: Diagnosis not present

## 2021-11-28 DIAGNOSIS — R279 Unspecified lack of coordination: Secondary | ICD-10-CM | POA: Diagnosis not present

## 2021-11-28 DIAGNOSIS — G9341 Metabolic encephalopathy: Secondary | ICD-10-CM | POA: Diagnosis not present

## 2021-11-28 DIAGNOSIS — R0689 Other abnormalities of breathing: Secondary | ICD-10-CM | POA: Diagnosis not present

## 2021-11-28 DIAGNOSIS — A419 Sepsis, unspecified organism: Secondary | ICD-10-CM | POA: Diagnosis not present

## 2021-11-28 DIAGNOSIS — J9811 Atelectasis: Secondary | ICD-10-CM | POA: Diagnosis not present

## 2021-11-28 DIAGNOSIS — R339 Retention of urine, unspecified: Secondary | ICD-10-CM | POA: Diagnosis not present

## 2021-11-28 DIAGNOSIS — E876 Hypokalemia: Secondary | ICD-10-CM | POA: Diagnosis not present

## 2021-11-28 DIAGNOSIS — L03116 Cellulitis of left lower limb: Secondary | ICD-10-CM | POA: Diagnosis not present

## 2021-11-28 DIAGNOSIS — E1142 Type 2 diabetes mellitus with diabetic polyneuropathy: Secondary | ICD-10-CM | POA: Diagnosis not present

## 2021-11-28 DIAGNOSIS — I10 Essential (primary) hypertension: Secondary | ICD-10-CM | POA: Diagnosis not present

## 2021-11-28 DIAGNOSIS — R4182 Altered mental status, unspecified: Secondary | ICD-10-CM | POA: Diagnosis not present

## 2021-11-28 DIAGNOSIS — J3489 Other specified disorders of nose and nasal sinuses: Secondary | ICD-10-CM | POA: Diagnosis not present

## 2021-11-28 DIAGNOSIS — R208 Other disturbances of skin sensation: Secondary | ICD-10-CM | POA: Diagnosis not present

## 2021-11-28 DIAGNOSIS — I959 Hypotension, unspecified: Secondary | ICD-10-CM | POA: Diagnosis not present

## 2021-11-28 DIAGNOSIS — G4739 Other sleep apnea: Secondary | ICD-10-CM | POA: Diagnosis not present

## 2021-11-28 DIAGNOSIS — R000 Tachycardia, unspecified: Secondary | ICD-10-CM | POA: Diagnosis not present

## 2021-11-28 DIAGNOSIS — J9610 Chronic respiratory failure, unspecified whether with hypoxia or hypercapnia: Secondary | ICD-10-CM | POA: Diagnosis not present

## 2021-11-29 DIAGNOSIS — E785 Hyperlipidemia, unspecified: Secondary | ICD-10-CM | POA: Diagnosis not present

## 2021-11-29 DIAGNOSIS — L03116 Cellulitis of left lower limb: Secondary | ICD-10-CM | POA: Diagnosis not present

## 2021-11-29 DIAGNOSIS — I2510 Atherosclerotic heart disease of native coronary artery without angina pectoris: Secondary | ICD-10-CM | POA: Diagnosis not present

## 2021-11-29 DIAGNOSIS — G4730 Sleep apnea, unspecified: Secondary | ICD-10-CM | POA: Diagnosis not present

## 2021-11-29 DIAGNOSIS — A419 Sepsis, unspecified organism: Secondary | ICD-10-CM | POA: Diagnosis not present

## 2021-11-29 DIAGNOSIS — I10 Essential (primary) hypertension: Secondary | ICD-10-CM | POA: Diagnosis not present

## 2021-11-29 DIAGNOSIS — G9341 Metabolic encephalopathy: Secondary | ICD-10-CM | POA: Diagnosis not present

## 2021-11-29 DIAGNOSIS — E1142 Type 2 diabetes mellitus with diabetic polyneuropathy: Secondary | ICD-10-CM | POA: Diagnosis not present

## 2021-11-29 DIAGNOSIS — J3489 Other specified disorders of nose and nasal sinuses: Secondary | ICD-10-CM | POA: Diagnosis not present

## 2021-11-30 DIAGNOSIS — I10 Essential (primary) hypertension: Secondary | ICD-10-CM | POA: Diagnosis not present

## 2021-11-30 DIAGNOSIS — R000 Tachycardia, unspecified: Secondary | ICD-10-CM | POA: Diagnosis not present

## 2021-11-30 DIAGNOSIS — A419 Sepsis, unspecified organism: Secondary | ICD-10-CM | POA: Diagnosis not present

## 2021-11-30 DIAGNOSIS — R339 Retention of urine, unspecified: Secondary | ICD-10-CM | POA: Diagnosis not present

## 2021-11-30 DIAGNOSIS — R600 Localized edema: Secondary | ICD-10-CM | POA: Diagnosis not present

## 2021-11-30 DIAGNOSIS — G4730 Sleep apnea, unspecified: Secondary | ICD-10-CM | POA: Diagnosis not present

## 2021-11-30 DIAGNOSIS — E1142 Type 2 diabetes mellitus with diabetic polyneuropathy: Secondary | ICD-10-CM | POA: Diagnosis not present

## 2021-11-30 DIAGNOSIS — E785 Hyperlipidemia, unspecified: Secondary | ICD-10-CM | POA: Diagnosis not present

## 2021-11-30 DIAGNOSIS — I2510 Atherosclerotic heart disease of native coronary artery without angina pectoris: Secondary | ICD-10-CM | POA: Diagnosis not present

## 2021-11-30 DIAGNOSIS — G9341 Metabolic encephalopathy: Secondary | ICD-10-CM | POA: Diagnosis not present

## 2021-11-30 DIAGNOSIS — E876 Hypokalemia: Secondary | ICD-10-CM | POA: Diagnosis not present

## 2021-12-01 DIAGNOSIS — G9341 Metabolic encephalopathy: Secondary | ICD-10-CM | POA: Diagnosis not present

## 2021-12-01 DIAGNOSIS — F0390 Unspecified dementia without behavioral disturbance: Secondary | ICD-10-CM | POA: Diagnosis not present

## 2021-12-01 DIAGNOSIS — N400 Enlarged prostate without lower urinary tract symptoms: Secondary | ICD-10-CM | POA: Diagnosis not present

## 2021-12-01 DIAGNOSIS — I10 Essential (primary) hypertension: Secondary | ICD-10-CM | POA: Diagnosis not present

## 2021-12-01 DIAGNOSIS — L03116 Cellulitis of left lower limb: Secondary | ICD-10-CM | POA: Diagnosis not present

## 2021-12-01 DIAGNOSIS — G4733 Obstructive sleep apnea (adult) (pediatric): Secondary | ICD-10-CM | POA: Diagnosis not present

## 2021-12-01 DIAGNOSIS — E1142 Type 2 diabetes mellitus with diabetic polyneuropathy: Secondary | ICD-10-CM | POA: Diagnosis not present

## 2021-12-01 DIAGNOSIS — A419 Sepsis, unspecified organism: Secondary | ICD-10-CM | POA: Diagnosis not present

## 2021-12-02 ENCOUNTER — Telehealth: Payer: Self-pay

## 2021-12-02 DIAGNOSIS — L03116 Cellulitis of left lower limb: Secondary | ICD-10-CM | POA: Diagnosis not present

## 2021-12-02 DIAGNOSIS — J9610 Chronic respiratory failure, unspecified whether with hypoxia or hypercapnia: Secondary | ICD-10-CM | POA: Diagnosis not present

## 2021-12-02 DIAGNOSIS — F0390 Unspecified dementia without behavioral disturbance: Secondary | ICD-10-CM | POA: Diagnosis not present

## 2021-12-02 DIAGNOSIS — N400 Enlarged prostate without lower urinary tract symptoms: Secondary | ICD-10-CM | POA: Diagnosis not present

## 2021-12-02 DIAGNOSIS — E1142 Type 2 diabetes mellitus with diabetic polyneuropathy: Secondary | ICD-10-CM | POA: Diagnosis not present

## 2021-12-02 DIAGNOSIS — G4733 Obstructive sleep apnea (adult) (pediatric): Secondary | ICD-10-CM | POA: Diagnosis not present

## 2021-12-02 DIAGNOSIS — I10 Essential (primary) hypertension: Secondary | ICD-10-CM | POA: Diagnosis not present

## 2021-12-02 DIAGNOSIS — G9341 Metabolic encephalopathy: Secondary | ICD-10-CM | POA: Diagnosis not present

## 2021-12-02 DIAGNOSIS — A419 Sepsis, unspecified organism: Secondary | ICD-10-CM | POA: Diagnosis not present

## 2021-12-02 NOTE — Telephone Encounter (Signed)
Received VM from pt's wife [name] that pt is currently admitted at [facility] since Saturday 7/8 with "high fevers & confusion." [name] wanted to let Dr Marin Olp know that they are currently holding his Calquence during admission. Dr Marin Olp out of the office until Monday 7/17 but will be alerted to this admission upon his return. dph

## 2021-12-03 ENCOUNTER — Encounter (HOSPITAL_COMMUNITY): Admission: RE | Admit: 2021-12-03 | Payer: Medicare HMO | Source: Ambulatory Visit

## 2021-12-04 DIAGNOSIS — R5381 Other malaise: Secondary | ICD-10-CM | POA: Diagnosis not present

## 2021-12-04 DIAGNOSIS — A419 Sepsis, unspecified organism: Secondary | ICD-10-CM | POA: Diagnosis not present

## 2021-12-04 DIAGNOSIS — N400 Enlarged prostate without lower urinary tract symptoms: Secondary | ICD-10-CM | POA: Diagnosis not present

## 2021-12-04 DIAGNOSIS — L03116 Cellulitis of left lower limb: Secondary | ICD-10-CM | POA: Diagnosis not present

## 2021-12-04 DIAGNOSIS — R279 Unspecified lack of coordination: Secondary | ICD-10-CM | POA: Diagnosis not present

## 2021-12-04 DIAGNOSIS — G4739 Other sleep apnea: Secondary | ICD-10-CM | POA: Diagnosis not present

## 2021-12-04 DIAGNOSIS — E1169 Type 2 diabetes mellitus with other specified complication: Secondary | ICD-10-CM | POA: Diagnosis not present

## 2021-12-04 DIAGNOSIS — M109 Gout, unspecified: Secondary | ICD-10-CM | POA: Diagnosis not present

## 2021-12-04 DIAGNOSIS — G629 Polyneuropathy, unspecified: Secondary | ICD-10-CM | POA: Diagnosis not present

## 2021-12-04 DIAGNOSIS — R2681 Unsteadiness on feet: Secondary | ICD-10-CM | POA: Diagnosis not present

## 2021-12-04 DIAGNOSIS — E1122 Type 2 diabetes mellitus with diabetic chronic kidney disease: Secondary | ICD-10-CM | POA: Diagnosis not present

## 2021-12-04 DIAGNOSIS — E1142 Type 2 diabetes mellitus with diabetic polyneuropathy: Secondary | ICD-10-CM | POA: Diagnosis not present

## 2021-12-04 DIAGNOSIS — E785 Hyperlipidemia, unspecified: Secondary | ICD-10-CM | POA: Diagnosis not present

## 2021-12-04 DIAGNOSIS — E119 Type 2 diabetes mellitus without complications: Secondary | ICD-10-CM | POA: Diagnosis not present

## 2021-12-04 DIAGNOSIS — R609 Edema, unspecified: Secondary | ICD-10-CM | POA: Diagnosis not present

## 2021-12-04 DIAGNOSIS — G3281 Cerebellar ataxia in diseases classified elsewhere: Secondary | ICD-10-CM | POA: Diagnosis not present

## 2021-12-04 DIAGNOSIS — I129 Hypertensive chronic kidney disease with stage 1 through stage 4 chronic kidney disease, or unspecified chronic kidney disease: Secondary | ICD-10-CM | POA: Diagnosis not present

## 2021-12-04 DIAGNOSIS — Z743 Need for continuous supervision: Secondary | ICD-10-CM | POA: Diagnosis not present

## 2021-12-04 DIAGNOSIS — C8590 Non-Hodgkin lymphoma, unspecified, unspecified site: Secondary | ICD-10-CM | POA: Diagnosis not present

## 2021-12-04 DIAGNOSIS — J9610 Chronic respiratory failure, unspecified whether with hypoxia or hypercapnia: Secondary | ICD-10-CM | POA: Diagnosis not present

## 2021-12-04 DIAGNOSIS — N189 Chronic kidney disease, unspecified: Secondary | ICD-10-CM | POA: Diagnosis not present

## 2021-12-04 DIAGNOSIS — I10 Essential (primary) hypertension: Secondary | ICD-10-CM | POA: Diagnosis not present

## 2021-12-04 DIAGNOSIS — R7881 Bacteremia: Secondary | ICD-10-CM | POA: Diagnosis not present

## 2021-12-04 DIAGNOSIS — G4733 Obstructive sleep apnea (adult) (pediatric): Secondary | ICD-10-CM | POA: Diagnosis not present

## 2021-12-04 DIAGNOSIS — G9341 Metabolic encephalopathy: Secondary | ICD-10-CM | POA: Diagnosis not present

## 2021-12-04 DIAGNOSIS — F0390 Unspecified dementia without behavioral disturbance: Secondary | ICD-10-CM | POA: Diagnosis not present

## 2021-12-04 DIAGNOSIS — Z872 Personal history of diseases of the skin and subcutaneous tissue: Secondary | ICD-10-CM | POA: Diagnosis not present

## 2021-12-04 DIAGNOSIS — Z9989 Dependence on other enabling machines and devices: Secondary | ICD-10-CM | POA: Diagnosis not present

## 2021-12-04 DIAGNOSIS — C8148 Lymphocyte-rich classical Hodgkin lymphoma, lymph nodes of multiple sites: Secondary | ICD-10-CM | POA: Diagnosis not present

## 2021-12-04 DIAGNOSIS — Z8673 Personal history of transient ischemic attack (TIA), and cerebral infarction without residual deficits: Secondary | ICD-10-CM | POA: Diagnosis not present

## 2021-12-04 DIAGNOSIS — C8300 Small cell B-cell lymphoma, unspecified site: Secondary | ICD-10-CM | POA: Diagnosis not present

## 2021-12-07 DIAGNOSIS — C8300 Small cell B-cell lymphoma, unspecified site: Secondary | ICD-10-CM | POA: Diagnosis not present

## 2021-12-07 DIAGNOSIS — R2681 Unsteadiness on feet: Secondary | ICD-10-CM | POA: Diagnosis not present

## 2021-12-07 DIAGNOSIS — E1142 Type 2 diabetes mellitus with diabetic polyneuropathy: Secondary | ICD-10-CM | POA: Diagnosis not present

## 2021-12-07 DIAGNOSIS — N400 Enlarged prostate without lower urinary tract symptoms: Secondary | ICD-10-CM | POA: Diagnosis not present

## 2021-12-07 DIAGNOSIS — L03116 Cellulitis of left lower limb: Secondary | ICD-10-CM | POA: Diagnosis not present

## 2021-12-07 DIAGNOSIS — I10 Essential (primary) hypertension: Secondary | ICD-10-CM | POA: Diagnosis not present

## 2021-12-07 DIAGNOSIS — G4733 Obstructive sleep apnea (adult) (pediatric): Secondary | ICD-10-CM | POA: Diagnosis not present

## 2021-12-07 DIAGNOSIS — R7881 Bacteremia: Secondary | ICD-10-CM | POA: Diagnosis not present

## 2021-12-07 DIAGNOSIS — E119 Type 2 diabetes mellitus without complications: Secondary | ICD-10-CM | POA: Diagnosis not present

## 2021-12-07 DIAGNOSIS — F0390 Unspecified dementia without behavioral disturbance: Secondary | ICD-10-CM | POA: Diagnosis not present

## 2021-12-07 DIAGNOSIS — R5381 Other malaise: Secondary | ICD-10-CM | POA: Diagnosis not present

## 2021-12-07 DIAGNOSIS — C8148 Lymphocyte-rich classical Hodgkin lymphoma, lymph nodes of multiple sites: Secondary | ICD-10-CM | POA: Diagnosis not present

## 2021-12-08 ENCOUNTER — Encounter: Payer: Self-pay | Admitting: *Deleted

## 2021-12-08 ENCOUNTER — Other Ambulatory Visit: Payer: Self-pay | Admitting: *Deleted

## 2021-12-08 DIAGNOSIS — C9110 Chronic lymphocytic leukemia of B-cell type not having achieved remission: Secondary | ICD-10-CM

## 2021-12-08 NOTE — Progress Notes (Signed)
[name], patients significant other, contacted [place] to let us know that patient is out of the hospital and has been off of his Calquence. Dr Marin Olp notified.  Restart Calquence at 100 mg daily for now until we see patient.  PAtient is staying at [facility].  [name] will pass this along to facility nurses

## 2021-12-09 DIAGNOSIS — I10 Essential (primary) hypertension: Secondary | ICD-10-CM | POA: Diagnosis not present

## 2021-12-09 DIAGNOSIS — G4733 Obstructive sleep apnea (adult) (pediatric): Secondary | ICD-10-CM | POA: Diagnosis not present

## 2021-12-09 DIAGNOSIS — R609 Edema, unspecified: Secondary | ICD-10-CM | POA: Diagnosis not present

## 2021-12-10 DIAGNOSIS — L03116 Cellulitis of left lower limb: Secondary | ICD-10-CM | POA: Diagnosis not present

## 2021-12-10 DIAGNOSIS — R7881 Bacteremia: Secondary | ICD-10-CM | POA: Diagnosis not present

## 2021-12-10 DIAGNOSIS — C8300 Small cell B-cell lymphoma, unspecified site: Secondary | ICD-10-CM | POA: Diagnosis not present

## 2021-12-10 DIAGNOSIS — R2681 Unsteadiness on feet: Secondary | ICD-10-CM | POA: Diagnosis not present

## 2021-12-10 DIAGNOSIS — E1142 Type 2 diabetes mellitus with diabetic polyneuropathy: Secondary | ICD-10-CM | POA: Diagnosis not present

## 2021-12-11 DIAGNOSIS — E119 Type 2 diabetes mellitus without complications: Secondary | ICD-10-CM | POA: Diagnosis not present

## 2021-12-11 DIAGNOSIS — I10 Essential (primary) hypertension: Secondary | ICD-10-CM | POA: Diagnosis not present

## 2021-12-11 DIAGNOSIS — A419 Sepsis, unspecified organism: Secondary | ICD-10-CM | POA: Diagnosis not present

## 2021-12-11 DIAGNOSIS — G4733 Obstructive sleep apnea (adult) (pediatric): Secondary | ICD-10-CM | POA: Diagnosis not present

## 2021-12-11 DIAGNOSIS — C8590 Non-Hodgkin lymphoma, unspecified, unspecified site: Secondary | ICD-10-CM | POA: Diagnosis not present

## 2021-12-11 DIAGNOSIS — Z8673 Personal history of transient ischemic attack (TIA), and cerebral infarction without residual deficits: Secondary | ICD-10-CM | POA: Diagnosis not present

## 2021-12-11 DIAGNOSIS — M109 Gout, unspecified: Secondary | ICD-10-CM | POA: Diagnosis not present

## 2021-12-11 DIAGNOSIS — F0390 Unspecified dementia without behavioral disturbance: Secondary | ICD-10-CM | POA: Diagnosis not present

## 2021-12-14 DIAGNOSIS — E1142 Type 2 diabetes mellitus with diabetic polyneuropathy: Secondary | ICD-10-CM | POA: Diagnosis not present

## 2021-12-14 DIAGNOSIS — E1122 Type 2 diabetes mellitus with diabetic chronic kidney disease: Secondary | ICD-10-CM | POA: Diagnosis not present

## 2021-12-14 DIAGNOSIS — L03116 Cellulitis of left lower limb: Secondary | ICD-10-CM | POA: Diagnosis not present

## 2021-12-14 DIAGNOSIS — N189 Chronic kidney disease, unspecified: Secondary | ICD-10-CM | POA: Diagnosis not present

## 2021-12-14 DIAGNOSIS — C8300 Small cell B-cell lymphoma, unspecified site: Secondary | ICD-10-CM | POA: Diagnosis not present

## 2021-12-14 DIAGNOSIS — I129 Hypertensive chronic kidney disease with stage 1 through stage 4 chronic kidney disease, or unspecified chronic kidney disease: Secondary | ICD-10-CM | POA: Diagnosis not present

## 2021-12-14 DIAGNOSIS — R2681 Unsteadiness on feet: Secondary | ICD-10-CM | POA: Diagnosis not present

## 2021-12-14 DIAGNOSIS — R7881 Bacteremia: Secondary | ICD-10-CM | POA: Diagnosis not present

## 2021-12-16 DIAGNOSIS — I10 Essential (primary) hypertension: Secondary | ICD-10-CM | POA: Diagnosis not present

## 2021-12-17 DIAGNOSIS — C8300 Small cell B-cell lymphoma, unspecified site: Secondary | ICD-10-CM | POA: Diagnosis not present

## 2021-12-17 DIAGNOSIS — L03116 Cellulitis of left lower limb: Secondary | ICD-10-CM | POA: Diagnosis not present

## 2021-12-17 DIAGNOSIS — R2681 Unsteadiness on feet: Secondary | ICD-10-CM | POA: Diagnosis not present

## 2021-12-17 DIAGNOSIS — R7881 Bacteremia: Secondary | ICD-10-CM | POA: Diagnosis not present

## 2021-12-17 DIAGNOSIS — E1142 Type 2 diabetes mellitus with diabetic polyneuropathy: Secondary | ICD-10-CM | POA: Diagnosis not present

## 2021-12-18 DIAGNOSIS — G629 Polyneuropathy, unspecified: Secondary | ICD-10-CM | POA: Diagnosis not present

## 2021-12-18 DIAGNOSIS — F0390 Unspecified dementia without behavioral disturbance: Secondary | ICD-10-CM | POA: Diagnosis not present

## 2021-12-18 DIAGNOSIS — E119 Type 2 diabetes mellitus without complications: Secondary | ICD-10-CM | POA: Diagnosis not present

## 2021-12-18 DIAGNOSIS — G4733 Obstructive sleep apnea (adult) (pediatric): Secondary | ICD-10-CM | POA: Diagnosis not present

## 2021-12-18 DIAGNOSIS — C8590 Non-Hodgkin lymphoma, unspecified, unspecified site: Secondary | ICD-10-CM | POA: Diagnosis not present

## 2021-12-18 DIAGNOSIS — R5381 Other malaise: Secondary | ICD-10-CM | POA: Diagnosis not present

## 2021-12-18 DIAGNOSIS — E785 Hyperlipidemia, unspecified: Secondary | ICD-10-CM | POA: Diagnosis not present

## 2021-12-18 DIAGNOSIS — I10 Essential (primary) hypertension: Secondary | ICD-10-CM | POA: Diagnosis not present

## 2021-12-25 DIAGNOSIS — Y998 Other external cause status: Secondary | ICD-10-CM | POA: Diagnosis not present

## 2021-12-25 DIAGNOSIS — R531 Weakness: Secondary | ICD-10-CM | POA: Diagnosis not present

## 2021-12-25 DIAGNOSIS — S199XXA Unspecified injury of neck, initial encounter: Secondary | ICD-10-CM | POA: Diagnosis not present

## 2021-12-25 DIAGNOSIS — M79662 Pain in left lower leg: Secondary | ICD-10-CM | POA: Diagnosis not present

## 2021-12-25 DIAGNOSIS — R9431 Abnormal electrocardiogram [ECG] [EKG]: Secondary | ICD-10-CM | POA: Diagnosis not present

## 2021-12-25 DIAGNOSIS — W19XXXA Unspecified fall, initial encounter: Secondary | ICD-10-CM | POA: Diagnosis not present

## 2021-12-25 DIAGNOSIS — M79605 Pain in left leg: Secondary | ICD-10-CM | POA: Diagnosis not present

## 2021-12-25 DIAGNOSIS — R296 Repeated falls: Secondary | ICD-10-CM | POA: Diagnosis not present

## 2021-12-25 DIAGNOSIS — Z981 Arthrodesis status: Secondary | ICD-10-CM | POA: Diagnosis not present

## 2021-12-25 DIAGNOSIS — M25551 Pain in right hip: Secondary | ICD-10-CM | POA: Diagnosis not present

## 2021-12-25 DIAGNOSIS — S0990XA Unspecified injury of head, initial encounter: Secondary | ICD-10-CM | POA: Diagnosis not present

## 2021-12-25 DIAGNOSIS — W1789XA Other fall from one level to another, initial encounter: Secondary | ICD-10-CM | POA: Diagnosis not present

## 2021-12-25 DIAGNOSIS — L0390 Cellulitis, unspecified: Secondary | ICD-10-CM | POA: Diagnosis not present

## 2021-12-26 DIAGNOSIS — Z7401 Bed confinement status: Secondary | ICD-10-CM | POA: Diagnosis not present

## 2021-12-26 DIAGNOSIS — M79605 Pain in left leg: Secondary | ICD-10-CM | POA: Diagnosis not present

## 2021-12-26 DIAGNOSIS — Z981 Arthrodesis status: Secondary | ICD-10-CM | POA: Diagnosis not present

## 2021-12-26 DIAGNOSIS — S199XXA Unspecified injury of neck, initial encounter: Secondary | ICD-10-CM | POA: Diagnosis not present

## 2021-12-26 DIAGNOSIS — M25551 Pain in right hip: Secondary | ICD-10-CM | POA: Diagnosis not present

## 2021-12-26 DIAGNOSIS — R531 Weakness: Secondary | ICD-10-CM | POA: Diagnosis not present

## 2021-12-26 DIAGNOSIS — S0990XA Unspecified injury of head, initial encounter: Secondary | ICD-10-CM | POA: Diagnosis not present

## 2021-12-28 DIAGNOSIS — R9431 Abnormal electrocardiogram [ECG] [EKG]: Secondary | ICD-10-CM | POA: Diagnosis not present

## 2021-12-29 ENCOUNTER — Telehealth: Payer: Self-pay | Admitting: *Deleted

## 2021-12-29 NOTE — Telephone Encounter (Signed)
Message received from patient's wife to inform Dr. Marin Olp that pt had a recent hospitalization and stay at a rehab facility, but is home now.  She states that she will call back when pt would like to reschedule PET scan.  Dr. Marin Olp notified.

## 2022-01-01 ENCOUNTER — Inpatient Hospital Stay: Payer: Medicare HMO | Attending: Hematology & Oncology

## 2022-01-01 VITALS — BP 121/78 | HR 95 | Temp 98.9°F | Resp 18

## 2022-01-01 DIAGNOSIS — C9112 Chronic lymphocytic leukemia of B-cell type in relapse: Secondary | ICD-10-CM | POA: Insufficient documentation

## 2022-01-01 DIAGNOSIS — Z8572 Personal history of non-Hodgkin lymphomas: Secondary | ICD-10-CM | POA: Insufficient documentation

## 2022-01-01 DIAGNOSIS — Z95828 Presence of other vascular implants and grafts: Secondary | ICD-10-CM

## 2022-01-01 DIAGNOSIS — H5213 Myopia, bilateral: Secondary | ICD-10-CM | POA: Diagnosis not present

## 2022-01-01 DIAGNOSIS — H524 Presbyopia: Secondary | ICD-10-CM | POA: Diagnosis not present

## 2022-01-01 DIAGNOSIS — Z452 Encounter for adjustment and management of vascular access device: Secondary | ICD-10-CM | POA: Insufficient documentation

## 2022-01-01 MED ORDER — SODIUM CHLORIDE 0.9% FLUSH
10.0000 mL | INTRAVENOUS | Status: DC | PRN
  Administered 2022-01-01: 10 mL via INTRAVENOUS

## 2022-01-01 MED ORDER — HEPARIN SOD (PORK) LOCK FLUSH 100 UNIT/ML IV SOLN
500.0000 [IU] | Freq: Once | INTRAVENOUS | Status: AC
  Administered 2022-01-01: 500 [IU] via INTRAVENOUS

## 2022-01-01 NOTE — Patient Instructions (Signed)

## 2022-01-07 DIAGNOSIS — N189 Chronic kidney disease, unspecified: Secondary | ICD-10-CM | POA: Diagnosis not present

## 2022-01-07 DIAGNOSIS — C9110 Chronic lymphocytic leukemia of B-cell type not having achieved remission: Secondary | ICD-10-CM | POA: Diagnosis not present

## 2022-01-07 DIAGNOSIS — D509 Iron deficiency anemia, unspecified: Secondary | ICD-10-CM | POA: Diagnosis not present

## 2022-01-07 DIAGNOSIS — E1122 Type 2 diabetes mellitus with diabetic chronic kidney disease: Secondary | ICD-10-CM | POA: Diagnosis not present

## 2022-01-07 DIAGNOSIS — A419 Sepsis, unspecified organism: Secondary | ICD-10-CM | POA: Diagnosis not present

## 2022-01-07 DIAGNOSIS — R531 Weakness: Secondary | ICD-10-CM | POA: Diagnosis not present

## 2022-01-07 DIAGNOSIS — R4182 Altered mental status, unspecified: Secondary | ICD-10-CM | POA: Diagnosis not present

## 2022-01-07 DIAGNOSIS — Z20822 Contact with and (suspected) exposure to covid-19: Secondary | ICD-10-CM | POA: Diagnosis not present

## 2022-01-07 DIAGNOSIS — R000 Tachycardia, unspecified: Secondary | ICD-10-CM | POA: Diagnosis not present

## 2022-01-07 DIAGNOSIS — E785 Hyperlipidemia, unspecified: Secondary | ICD-10-CM | POA: Diagnosis not present

## 2022-01-07 DIAGNOSIS — E1142 Type 2 diabetes mellitus with diabetic polyneuropathy: Secondary | ICD-10-CM | POA: Diagnosis not present

## 2022-01-07 DIAGNOSIS — L03116 Cellulitis of left lower limb: Secondary | ICD-10-CM | POA: Diagnosis not present

## 2022-01-07 DIAGNOSIS — Z7401 Bed confinement status: Secondary | ICD-10-CM | POA: Diagnosis not present

## 2022-01-07 DIAGNOSIS — E611 Iron deficiency: Secondary | ICD-10-CM | POA: Diagnosis not present

## 2022-01-07 DIAGNOSIS — E876 Hypokalemia: Secondary | ICD-10-CM | POA: Diagnosis not present

## 2022-01-07 DIAGNOSIS — I129 Hypertensive chronic kidney disease with stage 1 through stage 4 chronic kidney disease, or unspecified chronic kidney disease: Secondary | ICD-10-CM | POA: Diagnosis not present

## 2022-01-07 DIAGNOSIS — E871 Hypo-osmolality and hyponatremia: Secondary | ICD-10-CM | POA: Diagnosis not present

## 2022-01-07 DIAGNOSIS — C8330 Diffuse large B-cell lymphoma, unspecified site: Secondary | ICD-10-CM | POA: Diagnosis not present

## 2022-01-07 DIAGNOSIS — R0689 Other abnormalities of breathing: Secondary | ICD-10-CM | POA: Diagnosis not present

## 2022-01-07 DIAGNOSIS — C8338 Diffuse large B-cell lymphoma, lymph nodes of multiple sites: Secondary | ICD-10-CM | POA: Diagnosis not present

## 2022-01-07 DIAGNOSIS — E1169 Type 2 diabetes mellitus with other specified complication: Secondary | ICD-10-CM | POA: Diagnosis not present

## 2022-01-07 DIAGNOSIS — R509 Fever, unspecified: Secondary | ICD-10-CM | POA: Diagnosis not present

## 2022-01-07 DIAGNOSIS — E119 Type 2 diabetes mellitus without complications: Secondary | ICD-10-CM | POA: Diagnosis not present

## 2022-01-07 DIAGNOSIS — C8390 Non-follicular (diffuse) lymphoma, unspecified, unspecified site: Secondary | ICD-10-CM | POA: Diagnosis not present

## 2022-01-07 DIAGNOSIS — I10 Essential (primary) hypertension: Secondary | ICD-10-CM | POA: Diagnosis not present

## 2022-01-12 ENCOUNTER — Other Ambulatory Visit: Payer: Self-pay | Admitting: Hematology & Oncology

## 2022-01-13 DIAGNOSIS — E1142 Type 2 diabetes mellitus with diabetic polyneuropathy: Secondary | ICD-10-CM | POA: Diagnosis not present

## 2022-01-13 DIAGNOSIS — I129 Hypertensive chronic kidney disease with stage 1 through stage 4 chronic kidney disease, or unspecified chronic kidney disease: Secondary | ICD-10-CM | POA: Diagnosis not present

## 2022-01-13 DIAGNOSIS — E1122 Type 2 diabetes mellitus with diabetic chronic kidney disease: Secondary | ICD-10-CM | POA: Diagnosis not present

## 2022-01-13 DIAGNOSIS — N189 Chronic kidney disease, unspecified: Secondary | ICD-10-CM | POA: Diagnosis not present

## 2022-01-15 DIAGNOSIS — E119 Type 2 diabetes mellitus without complications: Secondary | ICD-10-CM | POA: Diagnosis not present

## 2022-01-15 DIAGNOSIS — C9110 Chronic lymphocytic leukemia of B-cell type not having achieved remission: Secondary | ICD-10-CM | POA: Diagnosis not present

## 2022-01-15 DIAGNOSIS — C8590 Non-Hodgkin lymphoma, unspecified, unspecified site: Secondary | ICD-10-CM | POA: Diagnosis not present

## 2022-01-15 DIAGNOSIS — L03116 Cellulitis of left lower limb: Secondary | ICD-10-CM | POA: Diagnosis not present

## 2022-01-16 DIAGNOSIS — E119 Type 2 diabetes mellitus without complications: Secondary | ICD-10-CM | POA: Diagnosis not present

## 2022-01-19 ENCOUNTER — Telehealth: Payer: Self-pay | Admitting: *Deleted

## 2022-01-19 DIAGNOSIS — L989 Disorder of the skin and subcutaneous tissue, unspecified: Secondary | ICD-10-CM | POA: Diagnosis not present

## 2022-01-19 DIAGNOSIS — I10 Essential (primary) hypertension: Secondary | ICD-10-CM | POA: Diagnosis not present

## 2022-01-19 NOTE — Telephone Encounter (Signed)
Received call from Lonia Farber, Director of Nursing, at [facility], [phone], stating," He came to [place] last Friday after an episode of sepsis in the hospital. He is on Doxycyline 100 mg po bid until 01/25/22. We are rechecking CBC, CMET on 01/20/22. His Calquence has been on HOLD since discharge. Does Dr. Marin Olp want to restart this medication? Keep it on HOLD?" Per Dr. Marin Olp, HOLD the Calquence until he is sees me in September. I verified the scheduled appointments for September. If he is still at the facility, they will provide transportation. She verbalized understanding.

## 2022-01-20 DIAGNOSIS — C9110 Chronic lymphocytic leukemia of B-cell type not having achieved remission: Secondary | ICD-10-CM | POA: Diagnosis not present

## 2022-01-20 DIAGNOSIS — E119 Type 2 diabetes mellitus without complications: Secondary | ICD-10-CM | POA: Diagnosis not present

## 2022-01-20 DIAGNOSIS — C8590 Non-Hodgkin lymphoma, unspecified, unspecified site: Secondary | ICD-10-CM | POA: Diagnosis not present

## 2022-01-20 DIAGNOSIS — L03116 Cellulitis of left lower limb: Secondary | ICD-10-CM | POA: Diagnosis not present

## 2022-01-21 DIAGNOSIS — E1142 Type 2 diabetes mellitus with diabetic polyneuropathy: Secondary | ICD-10-CM | POA: Diagnosis not present

## 2022-01-21 DIAGNOSIS — I129 Hypertensive chronic kidney disease with stage 1 through stage 4 chronic kidney disease, or unspecified chronic kidney disease: Secondary | ICD-10-CM | POA: Diagnosis not present

## 2022-01-21 DIAGNOSIS — E1122 Type 2 diabetes mellitus with diabetic chronic kidney disease: Secondary | ICD-10-CM | POA: Diagnosis not present

## 2022-01-21 DIAGNOSIS — N189 Chronic kidney disease, unspecified: Secondary | ICD-10-CM | POA: Diagnosis not present

## 2022-01-29 DIAGNOSIS — E119 Type 2 diabetes mellitus without complications: Secondary | ICD-10-CM | POA: Diagnosis not present

## 2022-01-29 DIAGNOSIS — R4182 Altered mental status, unspecified: Secondary | ICD-10-CM | POA: Diagnosis not present

## 2022-01-29 DIAGNOSIS — C8590 Non-Hodgkin lymphoma, unspecified, unspecified site: Secondary | ICD-10-CM | POA: Diagnosis not present

## 2022-02-04 ENCOUNTER — Encounter: Payer: Self-pay | Admitting: Hematology & Oncology

## 2022-02-04 ENCOUNTER — Other Ambulatory Visit: Payer: Self-pay

## 2022-02-04 ENCOUNTER — Inpatient Hospital Stay: Payer: Medicare HMO | Attending: Hematology & Oncology

## 2022-02-04 ENCOUNTER — Inpatient Hospital Stay (HOSPITAL_BASED_OUTPATIENT_CLINIC_OR_DEPARTMENT_OTHER): Payer: Medicare HMO | Admitting: Hematology & Oncology

## 2022-02-04 ENCOUNTER — Inpatient Hospital Stay: Payer: Medicare HMO

## 2022-02-04 VITALS — BP 128/80 | HR 89 | Temp 98.3°F | Resp 18 | Ht 72.0 in | Wt 273.0 lb

## 2022-02-04 DIAGNOSIS — C8330 Diffuse large B-cell lymphoma, unspecified site: Secondary | ICD-10-CM | POA: Insufficient documentation

## 2022-02-04 DIAGNOSIS — E119 Type 2 diabetes mellitus without complications: Secondary | ICD-10-CM | POA: Diagnosis not present

## 2022-02-04 DIAGNOSIS — R600 Localized edema: Secondary | ICD-10-CM

## 2022-02-04 DIAGNOSIS — Z79899 Other long term (current) drug therapy: Secondary | ICD-10-CM | POA: Insufficient documentation

## 2022-02-04 DIAGNOSIS — C8338 Diffuse large B-cell lymphoma, lymph nodes of multiple sites: Secondary | ICD-10-CM | POA: Diagnosis not present

## 2022-02-04 DIAGNOSIS — Z9221 Personal history of antineoplastic chemotherapy: Secondary | ICD-10-CM | POA: Insufficient documentation

## 2022-02-04 DIAGNOSIS — Z923 Personal history of irradiation: Secondary | ICD-10-CM | POA: Insufficient documentation

## 2022-02-04 DIAGNOSIS — C9110 Chronic lymphocytic leukemia of B-cell type not having achieved remission: Secondary | ICD-10-CM

## 2022-02-04 LAB — CBC WITH DIFFERENTIAL (CANCER CENTER ONLY)
Abs Immature Granulocytes: 0.04 10*3/uL (ref 0.00–0.07)
Basophils Absolute: 0 10*3/uL (ref 0.0–0.1)
Basophils Relative: 0 %
Eosinophils Absolute: 0.1 10*3/uL (ref 0.0–0.5)
Eosinophils Relative: 3 %
HCT: 36.4 % — ABNORMAL LOW (ref 39.0–52.0)
Hemoglobin: 12.9 g/dL — ABNORMAL LOW (ref 13.0–17.0)
Immature Granulocytes: 2 %
Lymphocytes Relative: 26 %
Lymphs Abs: 0.7 10*3/uL (ref 0.7–4.0)
MCH: 29.5 pg (ref 26.0–34.0)
MCHC: 35.4 g/dL (ref 30.0–36.0)
MCV: 83.1 fL (ref 80.0–100.0)
Monocytes Absolute: 0.4 10*3/uL (ref 0.1–1.0)
Monocytes Relative: 15 %
Neutro Abs: 1.5 10*3/uL — ABNORMAL LOW (ref 1.7–7.7)
Neutrophils Relative %: 54 %
Platelet Count: 150 10*3/uL (ref 150–400)
RBC: 4.38 MIL/uL (ref 4.22–5.81)
RDW: 13.8 % (ref 11.5–15.5)
WBC Count: 2.7 10*3/uL — ABNORMAL LOW (ref 4.0–10.5)
nRBC: 0 % (ref 0.0–0.2)

## 2022-02-04 LAB — CMP (CANCER CENTER ONLY)
ALT: 20 U/L (ref 0–44)
AST: 22 U/L (ref 15–41)
Albumin: 4.2 g/dL (ref 3.5–5.0)
Alkaline Phosphatase: 51 U/L (ref 38–126)
Anion gap: 10 (ref 5–15)
BUN: 12 mg/dL (ref 8–23)
CO2: 29 mmol/L (ref 22–32)
Calcium: 9.6 mg/dL (ref 8.9–10.3)
Chloride: 101 mmol/L (ref 98–111)
Creatinine: 1.17 mg/dL (ref 0.61–1.24)
GFR, Estimated: >60 mL/min (ref >60)
Glucose, Bld: 216 mg/dL — ABNORMAL HIGH (ref 70–99)
Potassium: 3.6 mmol/L (ref 3.5–5.1)
Sodium: 140 mmol/L (ref 135–145)
Total Bilirubin: 0.5 mg/dL (ref 0.3–1.2)
Total Protein: 6.6 g/dL (ref 6.5–8.1)

## 2022-02-04 LAB — LACTATE DEHYDROGENASE: LDH: 260 U/L — ABNORMAL HIGH (ref 98–192)

## 2022-02-04 NOTE — Progress Notes (Signed)
Hematology and Oncology Follow Up Visit  Ronald Rice [id number] [birth year] 76 y.o. 02/04/2022   Principle Diagnosis:  Diffuse large cell non-Hodgkin's lymphoma-Richter's transformation from CLL CLL recurrence  Current Therapy:   R-CHOP-s/p cycle #8-- started on 11/08/2018 XRT to the LEFT neck -- 4500 rad -- completed on 08/01/2019 Acalabrutinib 100 mg po BID -- start on 11/04/2020 -- on hold since 12/2021      Interim History:  Ronald Rice is back for follow-up.  Unfortunately, he was recently hospitalized.  He apparently developed cellulitis in his left lower leg and foot.  He is not sure how he developed this.  He had not fallen.  He had no cuts or scratches.  He was in the hospital for a week and then in rehab for about 2 weeks.  He does look pretty good right now.  He is off the acalabrutinib.  We stopped this prior about a month ago.  I think we can try to hold the acalabrutinib for right now.  He, as always, has as his biggest problem of diabetes.  His blood sugar is quite high today.  He has had no issues with COVID.  His appetite has been pretty good.  He has had no issues with bowels or bladder.  He has had no problems with diarrhea.  His right lower leg might be a little bit swollen and painful.  Given his recent history, I do not think it would be a bad idea to check in for a thromboembolic event.  He definitely is at higher risk for thromboembolism.  I do think we probably need to get another PET scan on him.  I think given his past history of lymphoma, it would not be a bad idea to get a PET scan, particularly since he is off therapy now.  Currently, I would say his performance status is probably ECOG 2.     Medications:  Current Outpatient Medications:    acetaminophen (TYLENOL) 500 MG tablet, Take 1,000 mg by mouth every 6 (six) hours as needed (pain). , Disp: , Rfl:    allopurinol (ZYLOPRIM) 300 MG tablet, Take 300 mg by mouth daily., Disp: , Rfl:    aspirin EC 81  MG tablet, Take 81 mg by mouth daily., Disp: , Rfl:    atenolol (TENORMIN) 25 MG tablet, Take 25 mg by mouth daily. , Disp: , Rfl:    atorvastatin (LIPITOR) 40 MG tablet, Take 40 mg by mouth daily., Disp: , Rfl:    chlorthalidone (HYGROTON) 25 MG tablet, Take 25 mg by mouth daily with breakfast., Disp: , Rfl:    Cholecalciferol (VITAMIN D3 PO), Take 1 tablet by mouth daily., Disp: , Rfl:    CINNAMON PO, Take 1 tablet by mouth daily., Disp: , Rfl:    diclofenac sodium (VOLTAREN) 1 % GEL, Apply 1 application topically as needed (pain). , Disp: , Rfl:    donepezil (ARICEPT ODT) 5 MG disintegrating tablet, Take 5 mg by mouth at bedtime., Disp: , Rfl:    DULoxetine (CYMBALTA) 60 MG capsule, TAKE 1 CAPSULE(60 MG) BY MOUTH DAILY, Disp: 90 capsule, Rfl: 3   famotidine (PEPCID) 20 MG tablet, Take 20 mg by mouth at bedtime as needed for heartburn., Disp: , Rfl:    ipratropium (ATROVENT) 0.06 % nasal spray, Place 1 spray into both nostrils daily as needed for rhinitis., Disp: , Rfl:    Lancets (ONETOUCH DELICA PLUS [id number]) MISC, , Disp: , Rfl:    loratadine (CLARITIN) 10 MG tablet, Take  10 mg by mouth as needed., Disp: , Rfl:    metFORMIN (GLUCOPHAGE-XR) 500 MG 24 hr tablet, Take 500 mg by mouth every evening. , Disp: , Rfl:    potassium chloride (K-DUR) 10 MEQ tablet, Take 10 mEq by mouth daily. , Disp: , Rfl:    pregabalin (LYRICA) 75 MG capsule, Take 75 mg by mouth 2 (two) times daily., Disp: , Rfl:    pyridoxine (B-6) 250 MG tablet, Take 250 mg by mouth daily., Disp: , Rfl:    Semaglutide,0.25 or 0.'5MG'$ /DOS, (OZEMPIC, 0.25 OR 0.5 MG/DOSE,) 2 MG/1.5ML SOPN, once a week. 12/01/2020 0.25., Disp: , Rfl:    tadalafil (CIALIS) 20 MG tablet, Take 20 mg by mouth as needed., Disp: , Rfl:    tamsulosin (FLOMAX) 0.4 MG CAPS capsule, Take 0.4 mg by mouth daily., Disp: , Rfl:    traMADol (ULTRAM) 50 MG tablet, TAKE 1 TABLET BY MOUTH EVERY 6 HOURS AS NEEDED (Patient taking differently: Take 50 mg by mouth every 6  (six) hours as needed for moderate pain.), Disp: 90 tablet, Rfl: 0   venetoclax (VENCLEXTA) 100 MG tablet, Take 1 tablet (100 mg total) by mouth daily. Tablets should be swallowed whole with a meal and a full glass of water., Disp: 30 tablet, Rfl: 6   Acalabrutinib Maleate (CALQUENCE) 100 MG TABS, Take 1 tablet (100 mg) by mouth in the morning and at bedtime. (Patient not taking: Reported on 02/04/2022), Disp: 60 tablet, Rfl: 6   lisinopril (ZESTRIL) 40 MG tablet, Take 1 tablet (40 mg total) by mouth daily for 30 days. (Patient not taking: Reported on 02/04/2022), Disp: 30 tablet, Rfl: 0   Na Sulfate-K Sulfate-Mg Sulf 17.5-3.13-1.6 GM/177ML SOLN, Suprep (no substitutions)-TAKE AS DIRECTED. (Patient not taking: Reported on 02/04/2022), Disp: 354 mL, Rfl: 0  Allergies:  No Known Allergies  Past Medical History, Surgical history, Social history, and Family History were reviewed and updated.  Review of Systems: Review of Systems  Constitutional: Negative.   HENT:  Negative.    Eyes: Negative.   Respiratory: Negative.    Cardiovascular: Negative.   Gastrointestinal: Negative.   Endocrine: Negative.   Genitourinary:  Positive for nocturia.   Musculoskeletal:  Positive for arthralgias, gait problem and myalgias.  Neurological:  Positive for gait problem.  Hematological: Negative.   Psychiatric/Behavioral: Negative.      Physical Exam:  height is 6' (1.829 m) and weight is 273 lb (123.8 kg). His oral temperature is 98.3 F (36.8 C). His blood pressure is 128/80 and his pulse is 89. His respiration is 18 and oxygen saturation is 98%.   Wt Readings from Last 3 Encounters:  02/04/22 273 lb (123.8 kg)  11/20/21 286 lb (129.7 kg)  09/30/21 281 lb (127.5 kg)    Physical Exam Vitals reviewed.  HENT:     Head: Normocephalic and atraumatic.  Eyes:     Pupils: Pupils are equal, round, and reactive to light.  Neck:     Comments: Examination of his neck shows no obvious palpable adenopathy.  He  has some changes from radiation on the left side of the neck.  I cannot palpate any supraclavicular lymph nodes.   Cardiovascular:     Rate and Rhythm: Normal rate and regular rhythm.     Heart sounds: Normal heart sounds.  Pulmonary:     Effort: Pulmonary effort is normal.     Breath sounds: Normal breath sounds.  Abdominal:     General: Bowel sounds are normal.  Palpations: Abdomen is soft.  Musculoskeletal:        General: No tenderness or deformity. Normal range of motion.  Lymphadenopathy:     Cervical: No cervical adenopathy.  Skin:    General: Skin is warm and dry.     Findings: No erythema or rash.  Neurological:     Mental Status: He is alert and oriented to person, place, and time.  Psychiatric:        Behavior: Behavior normal.        Thought Content: Thought content normal.        Judgment: Judgment normal.   Lab Results  Component Value Date   WBC 2.7 (L) 02/04/2022   HGB 12.9 (L) 02/04/2022   HCT 36.4 (L) 02/04/2022   MCV 83.1 02/04/2022   PLT 150 02/04/2022     Chemistry      Component Value Date/Time   NA 140 02/04/2022 1155   NA 142 01/25/2017 1217   K 3.6 02/04/2022 1155   K 4.1 01/25/2017 1217   CL 101 02/04/2022 1155   CL 102 01/25/2017 1217   CO2 29 02/04/2022 1155   CO2 33 01/25/2017 1217   BUN 12 02/04/2022 1155   BUN 12 01/25/2017 1217   CREATININE 1.17 02/04/2022 1155   CREATININE 1.4 (H) 01/25/2017 1217      Component Value Date/Time   CALCIUM 9.6 02/04/2022 1155   CALCIUM 9.7 01/25/2017 1217   ALKPHOS 51 02/04/2022 1155   ALKPHOS 59 01/25/2017 1217   AST 22 02/04/2022 1155   ALT 20 02/04/2022 1155   ALT 32 01/25/2017 1217   BILITOT 0.5 02/04/2022 1155      Impression and Plan: Mr. Bramel is a 76 year-old African-American male.  He was diagnosed with CLL back in [place].  He then had transformation to a large cell non-Hodgkin's lymphoma.  He was treated with chemotherapy followed by radiation therapy to the neck given that  he had bulky disease.   He completed his chemotherapy back in December 2020.  He then underwent radiation therapy which he completed I think in early February 2021.  He now has a recurrence of the CLL.  He has been on acalabrutinib.  For right now, we have him off this.  His blood counts certainly do not look all that bad.  His white cell count is little bit on the low side which could be from antibiotics that he had for his cellulitis.  We will see about a Doppler of his right leg.  I think this would be helpful.  I would like to get him back in about 6 weeks or so.  We will get a PET scan before I see him back.  If all looks good when we see him back, then maybe we can get him through the [place] season.    I have to give Mr. Hustead a lot of credit.  He is incredibly tough.  He has a wonderful wife who really helps him out.   Volanda Napoleon, MD 9/14/20231:09 PM

## 2022-02-04 NOTE — Patient Instructions (Signed)

## 2022-02-05 LAB — IGG, IGA, IGM
IgA: 497 mg/dL — ABNORMAL HIGH (ref 61–437)
IgG (Immunoglobin G), Serum: 466 mg/dL — ABNORMAL LOW (ref 603–1613)
IgM (Immunoglobulin M), Srm: 46 mg/dL (ref 15–143)

## 2022-02-09 ENCOUNTER — Encounter (HOSPITAL_COMMUNITY): Payer: Self-pay | Admitting: Hematology & Oncology

## 2022-02-09 DIAGNOSIS — N529 Male erectile dysfunction, unspecified: Secondary | ICD-10-CM | POA: Diagnosis not present

## 2022-02-09 DIAGNOSIS — L03116 Cellulitis of left lower limb: Secondary | ICD-10-CM | POA: Diagnosis not present

## 2022-02-09 DIAGNOSIS — Z79899 Other long term (current) drug therapy: Secondary | ICD-10-CM | POA: Diagnosis not present

## 2022-02-09 DIAGNOSIS — E785 Hyperlipidemia, unspecified: Secondary | ICD-10-CM | POA: Diagnosis not present

## 2022-02-09 DIAGNOSIS — R5383 Other fatigue: Secondary | ICD-10-CM | POA: Diagnosis not present

## 2022-02-09 DIAGNOSIS — E1140 Type 2 diabetes mellitus with diabetic neuropathy, unspecified: Secondary | ICD-10-CM | POA: Diagnosis not present

## 2022-02-09 LAB — FLOW CYTOMETRY

## 2022-02-15 ENCOUNTER — Ambulatory Visit (HOSPITAL_BASED_OUTPATIENT_CLINIC_OR_DEPARTMENT_OTHER): Admission: RE | Admit: 2022-02-15 | Payer: Medicare HMO | Source: Ambulatory Visit

## 2022-02-18 ENCOUNTER — Ambulatory Visit (HOSPITAL_BASED_OUTPATIENT_CLINIC_OR_DEPARTMENT_OTHER)
Admission: RE | Admit: 2022-02-18 | Discharge: 2022-02-18 | Disposition: A | Discharge status: Home / Self Care | Payer: Medicare HMO | Source: Ambulatory Visit | Attending: Hematology & Oncology | Admitting: Hematology & Oncology

## 2022-02-18 DIAGNOSIS — R600 Localized edema: Secondary | ICD-10-CM | POA: Insufficient documentation

## 2022-02-19 ENCOUNTER — Telehealth: Payer: Self-pay

## 2022-02-19 NOTE — Telephone Encounter (Signed)
-----   Message from Volanda Napoleon, MD sent at 02/18/2022  4:52 PM EDT ----- Call and let him know that there are no blood clots in his legs.  This is wonderful news.  Thanks.  Laurey Arrow

## 2022-02-19 NOTE — Telephone Encounter (Signed)
Called and informed patient of lab results, patient verbalized understanding and denies any questions or concerns at this time.   

## 2022-03-02 ENCOUNTER — Telehealth: Payer: Self-pay

## 2022-03-02 NOTE — Patient Outreach (Signed)
  Care Coordination   03/02/2022 Name: Ronald Rice MRN: [id number] DOB: August 09, 1945   Care Coordination Outreach Attempts:  An unsuccessful telephone outreach was attempted today to offer the patient information about available care coordination services as a benefit of their health plan.   Follow Up Plan:  Additional outreach attempts will be made to offer the patient care coordination information and services.   Encounter Outcome:  No Answer  Care Coordination Interventions Activated:  No   Care Coordination Interventions:  No, not indicated    Jone Baseman, RN, MSN [facility] Care Management Care Management Coordinator Direct Line [phone]

## 2022-03-05 DIAGNOSIS — E1122 Type 2 diabetes mellitus with diabetic chronic kidney disease: Secondary | ICD-10-CM | POA: Diagnosis not present

## 2022-03-05 DIAGNOSIS — E1142 Type 2 diabetes mellitus with diabetic polyneuropathy: Secondary | ICD-10-CM | POA: Diagnosis not present

## 2022-03-05 DIAGNOSIS — I129 Hypertensive chronic kidney disease with stage 1 through stage 4 chronic kidney disease, or unspecified chronic kidney disease: Secondary | ICD-10-CM | POA: Diagnosis not present

## 2022-03-05 DIAGNOSIS — N189 Chronic kidney disease, unspecified: Secondary | ICD-10-CM | POA: Diagnosis not present

## 2022-03-08 ENCOUNTER — Encounter (HOSPITAL_COMMUNITY)
Admission: RE | Admit: 2022-03-08 | Discharge: 2022-03-08 | Disposition: A | Discharge status: Home / Self Care | Payer: Medicare HMO | Source: Ambulatory Visit | Attending: Hematology & Oncology | Admitting: Hematology & Oncology

## 2022-03-08 DIAGNOSIS — C8338 Diffuse large B-cell lymphoma, lymph nodes of multiple sites: Secondary | ICD-10-CM | POA: Insufficient documentation

## 2022-03-08 DIAGNOSIS — C8590 Non-Hodgkin lymphoma, unspecified, unspecified site: Secondary | ICD-10-CM | POA: Diagnosis not present

## 2022-03-08 LAB — GLUCOSE, CAPILLARY: Glucose-Capillary: 171 mg/dL — ABNORMAL HIGH (ref 70–99)

## 2022-03-08 MED ORDER — FLUDEOXYGLUCOSE F - 18 (FDG) INJECTION
13.7000 | Freq: Once | INTRAVENOUS | Status: AC
  Administered 2022-03-08: 13.54 via INTRAVENOUS

## 2022-03-09 ENCOUNTER — Telehealth: Payer: Self-pay

## 2022-03-09 NOTE — Telephone Encounter (Signed)
Called and informed patient of results (ok per DPR). Requested a CB with any questions or concerns.

## 2022-03-09 NOTE — Telephone Encounter (Signed)
-----   Message from Volanda Napoleon, MD sent at 03/09/2022  9:24 AM EDT ----- Please call him and let him know that PET scan shows that there is no active lymphoma or leukemia.  Thanks.  Laurey Arrow

## 2022-03-10 ENCOUNTER — Inpatient Hospital Stay: Payer: Medicare HMO | Admitting: Hematology & Oncology

## 2022-03-10 ENCOUNTER — Inpatient Hospital Stay: Payer: Medicare HMO

## 2022-03-16 ENCOUNTER — Inpatient Hospital Stay: Payer: Medicare HMO | Admitting: Family

## 2022-03-16 ENCOUNTER — Inpatient Hospital Stay: Payer: Medicare HMO

## 2022-03-17 ENCOUNTER — Telehealth: Payer: Self-pay

## 2022-03-17 NOTE — Patient Outreach (Signed)
  Care Coordination   03/17/2022 Name: Ronald Rice MRN: [id number] DOB: [birth year]   Care Coordination Outreach Attempts:  A second unsuccessful outreach was attempted today to offer the patient with information about available care coordination services as a benefit of their health plan.     Follow Up Plan:  Additional outreach attempts will be made to offer the patient care coordination information and services.   Encounter Outcome:  No Answer  Care Coordination Interventions Activated:  No   Care Coordination Interventions:  No, not indicated    Jone Baseman, RN, MSN [facility] Care Management Care Management Coordinator Direct Line [phone]

## 2022-03-18 ENCOUNTER — Inpatient Hospital Stay: Payer: Medicare HMO

## 2022-03-18 ENCOUNTER — Other Ambulatory Visit: Payer: Self-pay | Admitting: Oncology

## 2022-03-18 ENCOUNTER — Inpatient Hospital Stay: Payer: Medicare HMO | Admitting: Oncology

## 2022-03-18 DIAGNOSIS — C8338 Diffuse large B-cell lymphoma, lymph nodes of multiple sites: Secondary | ICD-10-CM

## 2022-03-26 ENCOUNTER — Inpatient Hospital Stay: Payer: Medicare HMO | Admitting: Family

## 2022-03-26 ENCOUNTER — Inpatient Hospital Stay: Payer: Medicare HMO

## 2022-03-31 ENCOUNTER — Telehealth: Payer: Self-pay

## 2022-03-31 NOTE — Patient Outreach (Signed)
  Care Coordination   03/31/2022 Name: Ronald Rice MRN: [id number] DOB: 02-24-1946   Care Coordination Outreach Attempts:  A third unsuccessful outreach was attempted today to offer the patient with information about available care coordination services as a benefit of their health plan.   Follow Up Plan:  No further outreach attempts will be made at this time. We have been unable to contact the patient to offer or enroll patient in care coordination services  Encounter Outcome:  No Answer  Care Coordination Interventions Activated:  No   Care Coordination Interventions:  No, not indicated    Jone Baseman, RN, MSN [facility] Care Management Coordinator Direct Line [phone]

## 2022-04-12 ENCOUNTER — Inpatient Hospital Stay: Payer: Medicare HMO | Attending: Hematology & Oncology

## 2022-04-12 ENCOUNTER — Inpatient Hospital Stay: Payer: Medicare HMO

## 2022-04-12 ENCOUNTER — Inpatient Hospital Stay: Payer: Medicare HMO | Admitting: Family

## 2022-04-28 ENCOUNTER — Inpatient Hospital Stay: Payer: PPO

## 2022-04-28 ENCOUNTER — Inpatient Hospital Stay: Payer: PPO | Admitting: Family

## 2022-04-28 DIAGNOSIS — R140 Abdominal distension (gaseous): Secondary | ICD-10-CM | POA: Diagnosis not present

## 2022-04-28 DIAGNOSIS — R0902 Hypoxemia: Secondary | ICD-10-CM | POA: Diagnosis not present

## 2022-04-28 DIAGNOSIS — I10 Essential (primary) hypertension: Secondary | ICD-10-CM | POA: Diagnosis not present

## 2022-04-28 DIAGNOSIS — R410 Disorientation, unspecified: Secondary | ICD-10-CM | POA: Diagnosis not present

## 2022-04-28 DIAGNOSIS — R0689 Other abnormalities of breathing: Secondary | ICD-10-CM | POA: Diagnosis not present

## 2022-04-29 ENCOUNTER — Telehealth: Payer: Self-pay | Admitting: *Deleted

## 2022-04-29 NOTE — Telephone Encounter (Signed)
Called patient and spoke to wife about rescheduling missed appointment and she informed me that her husband (patient) is in the hospital @ [facility] for a UTI. I told her to please call when he is discharged from the hospital.

## 2022-05-01 DIAGNOSIS — Z743 Need for continuous supervision: Secondary | ICD-10-CM | POA: Diagnosis not present

## 2022-05-01 DIAGNOSIS — R531 Weakness: Secondary | ICD-10-CM | POA: Diagnosis not present

## 2022-05-19 DIAGNOSIS — N3000 Acute cystitis without hematuria: Secondary | ICD-10-CM | POA: Diagnosis not present

## 2022-05-19 DIAGNOSIS — G9341 Metabolic encephalopathy: Secondary | ICD-10-CM | POA: Diagnosis not present

## 2022-05-19 DIAGNOSIS — E1142 Type 2 diabetes mellitus with diabetic polyneuropathy: Secondary | ICD-10-CM | POA: Diagnosis not present

## 2022-05-19 DIAGNOSIS — B9620 Unspecified Escherichia coli [E. coli] as the cause of diseases classified elsewhere: Secondary | ICD-10-CM | POA: Diagnosis not present

## 2022-05-25 ENCOUNTER — Inpatient Hospital Stay: Payer: PPO

## 2022-05-25 ENCOUNTER — Inpatient Hospital Stay: Payer: PPO | Admitting: Family

## 2022-05-28 ENCOUNTER — Encounter: Payer: Self-pay | Admitting: Family

## 2022-05-28 ENCOUNTER — Inpatient Hospital Stay (HOSPITAL_BASED_OUTPATIENT_CLINIC_OR_DEPARTMENT_OTHER): Payer: PPO | Admitting: Family

## 2022-05-28 ENCOUNTER — Inpatient Hospital Stay: Payer: PPO

## 2022-05-28 ENCOUNTER — Inpatient Hospital Stay: Payer: PPO | Attending: Hematology & Oncology

## 2022-05-28 VITALS — BP 127/78 | HR 88 | Temp 98.8°F | Resp 18

## 2022-05-28 DIAGNOSIS — C8338 Diffuse large B-cell lymphoma, lymph nodes of multiple sites: Secondary | ICD-10-CM

## 2022-05-28 DIAGNOSIS — C9110 Chronic lymphocytic leukemia of B-cell type not having achieved remission: Secondary | ICD-10-CM

## 2022-05-28 DIAGNOSIS — Z95828 Presence of other vascular implants and grafts: Secondary | ICD-10-CM

## 2022-05-28 DIAGNOSIS — C9112 Chronic lymphocytic leukemia of B-cell type in relapse: Secondary | ICD-10-CM | POA: Diagnosis not present

## 2022-05-28 DIAGNOSIS — Z452 Encounter for adjustment and management of vascular access device: Secondary | ICD-10-CM | POA: Insufficient documentation

## 2022-05-28 LAB — CMP (CANCER CENTER ONLY)
ALT: 29 U/L (ref 0–44)
AST: 23 U/L (ref 15–41)
Albumin: 4.5 g/dL (ref 3.5–5.0)
Alkaline Phosphatase: 45 U/L (ref 38–126)
Anion gap: 10 (ref 5–15)
BUN: 13 mg/dL (ref 8–23)
CO2: 31 mmol/L (ref 22–32)
Calcium: 9.4 mg/dL (ref 8.9–10.3)
Chloride: 99 mmol/L (ref 98–111)
Creatinine: 1.2 mg/dL (ref 0.61–1.24)
GFR, Estimated: >60 mL/min (ref >60)
Glucose, Bld: 260 mg/dL — ABNORMAL HIGH (ref 70–99)
Potassium: 3.6 mmol/L (ref 3.5–5.1)
Sodium: 140 mmol/L (ref 135–145)
Total Bilirubin: 0.7 mg/dL (ref 0.3–1.2)
Total Protein: 7.1 g/dL (ref 6.5–8.1)

## 2022-05-28 LAB — CBC WITH DIFFERENTIAL (CANCER CENTER ONLY)
Abs Immature Granulocytes: 0.01 10*3/uL (ref 0.00–0.07)
Basophils Absolute: 0 10*3/uL (ref 0.0–0.1)
Basophils Relative: 1 %
Eosinophils Absolute: 0.1 10*3/uL (ref 0.0–0.5)
Eosinophils Relative: 2 %
HCT: 38.7 % — ABNORMAL LOW (ref 39.0–52.0)
Hemoglobin: 13.8 g/dL (ref 13.0–17.0)
Immature Granulocytes: 0 %
Lymphocytes Relative: 27 %
Lymphs Abs: 0.9 10*3/uL (ref 0.7–4.0)
MCH: 29.7 pg (ref 26.0–34.0)
MCHC: 35.7 g/dL (ref 30.0–36.0)
MCV: 83.4 fL (ref 80.0–100.0)
Monocytes Absolute: 0.5 10*3/uL (ref 0.1–1.0)
Monocytes Relative: 14 %
Neutro Abs: 2 10*3/uL (ref 1.7–7.7)
Neutrophils Relative %: 56 %
Platelet Count: 168 10*3/uL (ref 150–400)
RBC: 4.64 MIL/uL (ref 4.22–5.81)
RDW: 13.9 % (ref 11.5–15.5)
WBC Count: 3.5 10*3/uL — ABNORMAL LOW (ref 4.0–10.5)
nRBC: 0 % (ref 0.0–0.2)

## 2022-05-28 MED ORDER — SODIUM CHLORIDE 0.9% FLUSH
10.0000 mL | Freq: Once | INTRAVENOUS | Status: AC
  Administered 2022-05-28: 10 mL via INTRAVENOUS

## 2022-05-28 MED ORDER — HEPARIN SOD (PORK) LOCK FLUSH 100 UNIT/ML IV SOLN
500.0000 [IU] | Freq: Once | INTRAVENOUS | Status: AC
  Administered 2022-05-28: 500 [IU] via INTRAVENOUS

## 2022-05-28 NOTE — Progress Notes (Signed)
Hematology and Oncology Follow Up Visit  Ronald Rice [id number] 07-06-1945 77 y.o. 05/28/2022   Principle Diagnosis:  Diffuse large cell non-Hodgkin's lymphoma-Richter's transformation from CLL CLL recurrence   Current Therapy:        R-CHOP-s/p cycle #8-- started on 11/08/2018 XRT to the LEFT neck -- 4500 rad -- completed on 08/01/2019 Acalabrutinib 100 mg po BID -- start on 11/04/2020 -- on hold since 12/2021   Interim History:  Mr. Greenblatt is here today with his wife for follow-up. He is doing well but has some fatigue.  He is working PT on Hotel manager and mobility. He ambulates at home with a Rolator.  They plan to discuss weight loss assistance with his PCP at his upcoming appointment.  He has a very good appetite and is staying well hydrated.  No adenopathy noted on exam.  No issue with infection. No fever, chills, n/v, cough, rash, dizziness, SOB, chest pain, palpitations, abdominal pain or changes in bowel or bladder habits.  No blood loss noted. No abnormal bruising or petechiae.  He has chronic swelling in his lower extremities. This improves over night. Pedal pulses are 1+. No falls or syncope reported.  Tingling in his fingertips waxes and wanes.   ECOG Performance Status: 2 - Symptomatic, <50% confined to bed  Medications:  Allergies as of 05/28/2022   No Known Allergies      Medication List        Accurate as of May 28, 2022  2:39 PM. If you have any questions, ask your nurse or doctor.          acetaminophen 500 MG tablet Commonly known as: TYLENOL Take 1,000 mg by mouth every 6 (six) hours as needed (pain).   allopurinol 300 MG tablet Commonly known as: ZYLOPRIM Take 300 mg by mouth daily.   aspirin EC 81 MG tablet Take 81 mg by mouth daily.   atenolol 25 MG tablet Commonly known as: TENORMIN Take 25 mg by mouth daily.   atorvastatin 40 MG tablet Commonly known as: LIPITOR Take 40 mg by mouth daily.   Calquence 100 MG tablet Generic  drug: acalabrutinib maleate Take 1 tablet (100 mg) by mouth in the morning and at bedtime.   chlorthalidone 25 MG tablet Commonly known as: HYGROTON Take 25 mg by mouth daily with breakfast.   CINNAMON PO Take 1 tablet by mouth daily.   diclofenac sodium 1 % Gel Commonly known as: VOLTAREN Apply 1 application topically as needed (pain).   donepezil 5 MG disintegrating tablet Commonly known as: ARICEPT ODT Take 5 mg by mouth at bedtime.   DULoxetine 60 MG capsule Commonly known as: CYMBALTA TAKE 1 CAPSULE(60 MG) BY MOUTH DAILY   famotidine 20 MG tablet Commonly known as: PEPCID Take 20 mg by mouth at bedtime as needed for heartburn.   ipratropium 0.06 % nasal spray Commonly known as: ATROVENT Place 1 spray into both nostrils daily as needed for rhinitis.   lisinopril 40 MG tablet Commonly known as: ZESTRIL Take 1 tablet (40 mg total) by mouth daily for 30 days.   loratadine 10 MG tablet Commonly known as: CLARITIN Take 10 mg by mouth as needed.   metFORMIN 500 MG 24 hr tablet Commonly known as: GLUCOPHAGE-XR Take 500 mg by mouth every evening.   Na Sulfate-K Sulfate-Mg Sulf 17.5-3.13-1.6 GM/177ML Soln Suprep (no substitutions)-TAKE AS DIRECTED.   OneTouch Delica Plus [id number] Misc   Ozempic (0.25 or 0.5 MG/DOSE) 2 MG/1.5ML Sopn Generic drug: Semaglutide(0.25 or 0.'5MG'$ /DOS) once  a week. 12/01/2020 0.25.   potassium chloride 10 MEQ tablet Commonly known as: KLOR-CON Take 10 mEq by mouth daily.   pregabalin 75 MG capsule Commonly known as: LYRICA Take 75 mg by mouth 2 (two) times daily.   pyridoxine 250 MG tablet Commonly known as: B-6 Take 250 mg by mouth daily.   tadalafil 20 MG tablet Commonly known as: CIALIS Take 20 mg by mouth as needed.   tamsulosin 0.4 MG Caps capsule Commonly known as: FLOMAX Take 0.4 mg by mouth daily.   traMADol 50 MG tablet Commonly known as: ULTRAM TAKE 1 TABLET BY MOUTH EVERY 6 HOURS AS NEEDED What changed: reasons  to take this   venetoclax 100 MG tablet Commonly known as: VENCLEXTA Take 1 tablet (100 mg total) by mouth daily. Tablets should be swallowed whole with a meal and a full glass of water.   VITAMIN D3 PO Take 1 tablet by mouth daily.        Allergies: No Known Allergies  Past Medical History, Surgical history, Social history, and Family History were reviewed and updated.  Review of Systems: All other 10 point review of systems is negative.   Physical Exam:  oral temperature is 98.8 F (37.1 C). His blood pressure is 127/78 and his pulse is 88. His respiration is 18 and oxygen saturation is 98%.   Wt Readings from Last 3 Encounters:  02/04/22 273 lb (123.8 kg)  11/20/21 286 lb (129.7 kg)  09/30/21 281 lb (127.5 kg)    Ocular: Sclerae unicteric, pupils equal, round and reactive to light Ear-nose-throat: Oropharynx clear, dentition fair Lymphatic: No cervical or supraclavicular adenopathy Lungs no rales or rhonchi, good excursion bilaterally Heart regular rate and rhythm, no murmur appreciated Abd soft, nontender, positive bowel sounds MSK no focal spinal tenderness, no joint edema Neuro: non-focal, well-oriented, appropriate affect Breasts: Deferred   Lab Results  Component Value Date   WBC 3.5 (L) 05/28/2022   HGB 13.8 05/28/2022   HCT 38.7 (L) 05/28/2022   MCV 83.4 05/28/2022   PLT 168 05/28/2022   No results found for: "FERRITIN", "IRON", "TIBC", "UIBC", "IRONPCTSAT" Lab Results  Component Value Date   RBC 4.64 05/28/2022   No results found for: "KPAFRELGTCHN", "LAMBDASER", "KAPLAMBRATIO" Lab Results  Component Value Date   IGGSERUM 466 (L) 02/04/2022   IGA 497 (H) 02/04/2022   IGMSERUM 46 02/04/2022   No results found for: "TOTALPROTELP", "ALBUMINELP", "A1GS", "A2GS", "BETS", "BETA2SER", "GAMS", "MSPIKE", "SPEI"   Chemistry      Component Value Date/Time   NA 140 05/28/2022 1350   NA 142 01/25/2017 1217   K 3.6 05/28/2022 1350   K 4.1 01/25/2017  1217   CL 99 05/28/2022 1350   CL 102 01/25/2017 1217   CO2 31 05/28/2022 1350   CO2 33 01/25/2017 1217   BUN 13 05/28/2022 1350   BUN 12 01/25/2017 1217   CREATININE 1.20 05/28/2022 1350   CREATININE 1.4 (H) 01/25/2017 1217      Component Value Date/Time   CALCIUM 9.4 05/28/2022 1350   CALCIUM 9.7 01/25/2017 1217   ALKPHOS 45 05/28/2022 1350   ALKPHOS 59 01/25/2017 1217   AST 23 05/28/2022 1350   ALT 29 05/28/2022 1350   ALT 32 01/25/2017 1217   BILITOT 0.7 05/28/2022 1350       Impression and Plan: Mr. Rodeheaver is a very pleasant 77 yo African American gentleman with history of CLL and then transformed to a large cell non-Hodgkin's lymphoma. He was treated with  radiation for bulky disease in the neck.  He completed chemo back in December 2020 followed by radiation in February 2021.  He now has recurrent CLL.  Counts at this time are stable.   I discussed today's exam and labs with Dr. Marin Olp. We will repeat a PET scan the last week of February to re-evaluate.  He will continue to hold his Acalabrutinib per MD.  Follow-up with MD the first full week of March.   Lottie Dawson, NP 1/5/20242:39 PM

## 2022-06-07 ENCOUNTER — Other Ambulatory Visit (HOSPITAL_COMMUNITY): Payer: Self-pay

## 2022-06-08 ENCOUNTER — Other Ambulatory Visit (HOSPITAL_COMMUNITY): Payer: Self-pay

## 2022-06-09 ENCOUNTER — Other Ambulatory Visit (HOSPITAL_COMMUNITY): Payer: Self-pay

## 2022-06-09 ENCOUNTER — Other Ambulatory Visit: Payer: Self-pay

## 2022-06-09 MED ORDER — ATENOLOL 25 MG PO TABS
25.0000 mg | ORAL_TABLET | Freq: Every day | ORAL | 3 refills | Status: AC
  Filled 2022-06-09 – 2022-09-08 (×3): qty 90, 90d supply, fill #0
  Filled 2023-02-07: qty 90, 90d supply, fill #1

## 2022-06-09 MED ORDER — POTASSIUM CHLORIDE CRYS ER 10 MEQ PO TBCR
10.0000 meq | EXTENDED_RELEASE_TABLET | Freq: Every day | ORAL | 10 refills | Status: DC
  Filled 2022-06-09 – 2022-09-08 (×3): qty 90, 90d supply, fill #0

## 2022-06-09 MED ORDER — ATORVASTATIN CALCIUM 40 MG PO TABS
40.0000 mg | ORAL_TABLET | Freq: Every day | ORAL | 11 refills | Status: AC
  Filled 2022-06-09 – 2022-09-08 (×3): qty 90, 90d supply, fill #0
  Filled 2023-02-07: qty 90, 90d supply, fill #1

## 2022-06-10 ENCOUNTER — Other Ambulatory Visit (HOSPITAL_COMMUNITY): Payer: Self-pay

## 2022-06-11 ENCOUNTER — Other Ambulatory Visit (HOSPITAL_COMMUNITY): Payer: Self-pay

## 2022-06-11 ENCOUNTER — Other Ambulatory Visit: Payer: Self-pay

## 2022-06-11 MED ORDER — TAMSULOSIN HCL 0.4 MG PO CAPS
0.4000 mg | ORAL_CAPSULE | Freq: Every day | ORAL | 2 refills | Status: DC
  Filled 2022-06-11 – 2022-06-16 (×2): qty 90, 90d supply, fill #0

## 2022-06-11 MED ORDER — CHLORTHALIDONE 25 MG PO TABS
25.0000 mg | ORAL_TABLET | Freq: Every morning | ORAL | 3 refills | Status: DC
  Filled 2022-06-11 – 2022-06-16 (×2): qty 90, 90d supply, fill #0

## 2022-06-11 MED ORDER — LISINOPRIL 40 MG PO TABS
40.0000 mg | ORAL_TABLET | Freq: Every day | ORAL | 0 refills | Status: DC
  Filled 2022-06-11 – 2022-06-16 (×2): qty 90, 90d supply, fill #0

## 2022-06-11 MED ORDER — CEFDINIR 300 MG PO CAPS
300.0000 mg | ORAL_CAPSULE | Freq: Two times a day (BID) | ORAL | 0 refills | Status: DC
  Filled 2022-06-16: qty 10, 5d supply, fill #0

## 2022-06-11 MED ORDER — TADALAFIL 10 MG PO TABS
10.0000 mg | ORAL_TABLET | Freq: Every day | ORAL | 1 refills | Status: AC | PRN
  Filled 2022-06-11 – 2022-09-08 (×3): qty 30, 30d supply, fill #0

## 2022-06-11 MED FILL — Duloxetine HCl Enteric Coated Pellets Cap 60 MG (Base Eq): ORAL | 90 days supply | Qty: 90 | Fill #0 | Status: CN

## 2022-06-15 ENCOUNTER — Other Ambulatory Visit: Payer: Self-pay

## 2022-06-16 ENCOUNTER — Other Ambulatory Visit (HOSPITAL_COMMUNITY): Payer: Self-pay

## 2022-06-16 ENCOUNTER — Other Ambulatory Visit (HOSPITAL_BASED_OUTPATIENT_CLINIC_OR_DEPARTMENT_OTHER): Payer: Self-pay

## 2022-06-16 MED FILL — Duloxetine HCl Enteric Coated Pellets Cap 60 MG (Base Eq): ORAL | 90 days supply | Qty: 90 | Fill #0 | Status: CN

## 2022-06-18 ENCOUNTER — Other Ambulatory Visit (HOSPITAL_COMMUNITY): Payer: Self-pay

## 2022-06-18 ENCOUNTER — Other Ambulatory Visit (HOSPITAL_BASED_OUTPATIENT_CLINIC_OR_DEPARTMENT_OTHER): Payer: Self-pay

## 2022-06-21 ENCOUNTER — Other Ambulatory Visit (HOSPITAL_COMMUNITY): Payer: Self-pay

## 2022-06-21 ENCOUNTER — Other Ambulatory Visit (HOSPITAL_BASED_OUTPATIENT_CLINIC_OR_DEPARTMENT_OTHER): Payer: Self-pay

## 2022-06-22 ENCOUNTER — Other Ambulatory Visit (HOSPITAL_BASED_OUTPATIENT_CLINIC_OR_DEPARTMENT_OTHER): Payer: Self-pay

## 2022-06-22 MED ORDER — TRAMADOL HCL 50 MG PO TABS
50.0000 mg | ORAL_TABLET | Freq: Four times a day (QID) | ORAL | 0 refills | Status: DC | PRN
  Filled 2022-06-22: qty 28, 7d supply, fill #0
  Filled 2022-07-30: qty 28, 7d supply, fill #1
  Filled 2022-08-13: qty 304, 76d supply, fill #2

## 2022-06-24 ENCOUNTER — Other Ambulatory Visit (HOSPITAL_BASED_OUTPATIENT_CLINIC_OR_DEPARTMENT_OTHER): Payer: Self-pay

## 2022-06-25 ENCOUNTER — Other Ambulatory Visit (HOSPITAL_BASED_OUTPATIENT_CLINIC_OR_DEPARTMENT_OTHER): Payer: Self-pay

## 2022-06-25 DIAGNOSIS — I10 Essential (primary) hypertension: Secondary | ICD-10-CM | POA: Diagnosis not present

## 2022-06-25 DIAGNOSIS — R5383 Other fatigue: Secondary | ICD-10-CM | POA: Diagnosis not present

## 2022-06-28 ENCOUNTER — Other Ambulatory Visit (HOSPITAL_BASED_OUTPATIENT_CLINIC_OR_DEPARTMENT_OTHER): Payer: Self-pay

## 2022-06-28 ENCOUNTER — Other Ambulatory Visit (HOSPITAL_COMMUNITY): Payer: Self-pay

## 2022-06-29 ENCOUNTER — Other Ambulatory Visit (HOSPITAL_BASED_OUTPATIENT_CLINIC_OR_DEPARTMENT_OTHER): Payer: Self-pay

## 2022-06-30 ENCOUNTER — Other Ambulatory Visit (HOSPITAL_BASED_OUTPATIENT_CLINIC_OR_DEPARTMENT_OTHER): Payer: Self-pay

## 2022-07-05 ENCOUNTER — Other Ambulatory Visit (HOSPITAL_BASED_OUTPATIENT_CLINIC_OR_DEPARTMENT_OTHER): Payer: Self-pay

## 2022-07-06 ENCOUNTER — Other Ambulatory Visit: Payer: Self-pay

## 2022-07-06 ENCOUNTER — Other Ambulatory Visit (HOSPITAL_COMMUNITY): Payer: Self-pay

## 2022-07-06 DIAGNOSIS — I2510 Atherosclerotic heart disease of native coronary artery without angina pectoris: Secondary | ICD-10-CM | POA: Diagnosis not present

## 2022-07-06 DIAGNOSIS — E1140 Type 2 diabetes mellitus with diabetic neuropathy, unspecified: Secondary | ICD-10-CM | POA: Diagnosis not present

## 2022-07-06 DIAGNOSIS — E785 Hyperlipidemia, unspecified: Secondary | ICD-10-CM | POA: Diagnosis not present

## 2022-07-06 DIAGNOSIS — I10 Essential (primary) hypertension: Secondary | ICD-10-CM | POA: Diagnosis not present

## 2022-07-06 MED ORDER — MOUNJARO 2.5 MG/0.5ML ~~LOC~~ SOAJ
2.5000 mg | SUBCUTANEOUS | 2 refills | Status: DC
  Filled 2022-07-06 – 2022-08-20 (×2): qty 2, 28d supply, fill #0
  Filled 2022-09-23 – 2022-09-24 (×2): qty 2, 28d supply, fill #1

## 2022-07-08 ENCOUNTER — Other Ambulatory Visit: Payer: Self-pay

## 2022-07-08 ENCOUNTER — Other Ambulatory Visit (HOSPITAL_COMMUNITY): Payer: Self-pay

## 2022-07-09 ENCOUNTER — Other Ambulatory Visit (HOSPITAL_BASED_OUTPATIENT_CLINIC_OR_DEPARTMENT_OTHER): Payer: Self-pay

## 2022-07-14 ENCOUNTER — Other Ambulatory Visit (HOSPITAL_COMMUNITY): Payer: Self-pay

## 2022-07-15 ENCOUNTER — Other Ambulatory Visit: Payer: Self-pay

## 2022-07-15 ENCOUNTER — Other Ambulatory Visit (HOSPITAL_COMMUNITY): Payer: Self-pay

## 2022-07-15 MED ORDER — PREGABALIN 75 MG PO CAPS
150.0000 mg | ORAL_CAPSULE | Freq: Two times a day (BID) | ORAL | 0 refills | Status: DC
  Filled 2022-07-15: qty 120, 30d supply, fill #0
  Filled 2022-08-13: qty 120, 30d supply, fill #1
  Filled 2022-09-23: qty 120, 30d supply, fill #2

## 2022-07-15 MED ORDER — METFORMIN HCL ER 500 MG PO TB24
1000.0000 mg | ORAL_TABLET | Freq: Two times a day (BID) | ORAL | 0 refills | Status: DC
  Filled 2022-07-15: qty 360, 90d supply, fill #0

## 2022-07-16 ENCOUNTER — Other Ambulatory Visit (HOSPITAL_COMMUNITY): Payer: Self-pay

## 2022-07-16 ENCOUNTER — Encounter (HOSPITAL_COMMUNITY)
Admission: RE | Admit: 2022-07-16 | Discharge: 2022-07-16 | Disposition: A | Discharge status: Home / Self Care | Payer: HMO | Source: Ambulatory Visit | Attending: Family | Admitting: Family

## 2022-07-16 DIAGNOSIS — C9110 Chronic lymphocytic leukemia of B-cell type not having achieved remission: Secondary | ICD-10-CM | POA: Insufficient documentation

## 2022-07-16 DIAGNOSIS — K8689 Other specified diseases of pancreas: Secondary | ICD-10-CM | POA: Diagnosis not present

## 2022-07-16 DIAGNOSIS — K429 Umbilical hernia without obstruction or gangrene: Secondary | ICD-10-CM | POA: Diagnosis not present

## 2022-07-16 DIAGNOSIS — I2510 Atherosclerotic heart disease of native coronary artery without angina pectoris: Secondary | ICD-10-CM | POA: Diagnosis not present

## 2022-07-16 DIAGNOSIS — K861 Other chronic pancreatitis: Secondary | ICD-10-CM | POA: Diagnosis not present

## 2022-07-16 LAB — GLUCOSE, CAPILLARY: Glucose-Capillary: 145 mg/dL — ABNORMAL HIGH (ref 70–99)

## 2022-07-16 MED ORDER — FLUDEOXYGLUCOSE F - 18 (FDG) INJECTION
13.6000 | Freq: Once | INTRAVENOUS | Status: AC | PRN
  Administered 2022-07-16: 13.6 via INTRAVENOUS

## 2022-07-19 ENCOUNTER — Telehealth: Payer: Self-pay

## 2022-07-19 NOTE — Telephone Encounter (Signed)
Called and informed patient of lab results, patient verbalized understanding and denies any questions or concerns at this time.   

## 2022-07-19 NOTE — Telephone Encounter (Signed)
-----   Message from Volanda Napoleon, MD sent at 07/19/2022  1:29 PM EST ----- Call and let him know that the PET scan still looks great.  There is nothing that is obvious for any abnormal recurrent lymphoma.  Laurey Arrow

## 2022-07-22 ENCOUNTER — Other Ambulatory Visit: Payer: Self-pay

## 2022-07-26 ENCOUNTER — Other Ambulatory Visit (HOSPITAL_COMMUNITY): Payer: Self-pay

## 2022-07-26 MED ORDER — AMOXICILLIN-POT CLAVULANATE 875-125 MG PO TABS
1.0000 | ORAL_TABLET | Freq: Two times a day (BID) | ORAL | 0 refills | Status: AC
  Filled 2022-07-26 – 2022-07-27 (×3): qty 2, 1d supply, fill #0

## 2022-07-27 ENCOUNTER — Other Ambulatory Visit (HOSPITAL_BASED_OUTPATIENT_CLINIC_OR_DEPARTMENT_OTHER): Payer: Self-pay

## 2022-07-27 ENCOUNTER — Other Ambulatory Visit: Payer: Self-pay

## 2022-07-27 ENCOUNTER — Other Ambulatory Visit (HOSPITAL_COMMUNITY): Payer: Self-pay

## 2022-07-29 ENCOUNTER — Inpatient Hospital Stay: Payer: HMO | Admitting: Hematology & Oncology

## 2022-07-29 ENCOUNTER — Inpatient Hospital Stay: Payer: HMO

## 2022-07-30 ENCOUNTER — Other Ambulatory Visit (HOSPITAL_COMMUNITY): Payer: Self-pay

## 2022-07-30 ENCOUNTER — Other Ambulatory Visit: Payer: Self-pay

## 2022-08-02 ENCOUNTER — Other Ambulatory Visit (HOSPITAL_COMMUNITY): Payer: Self-pay

## 2022-08-04 DIAGNOSIS — E1169 Type 2 diabetes mellitus with other specified complication: Secondary | ICD-10-CM | POA: Diagnosis not present

## 2022-08-04 DIAGNOSIS — Z7982 Long term (current) use of aspirin: Secondary | ICD-10-CM | POA: Diagnosis not present

## 2022-08-04 DIAGNOSIS — E785 Hyperlipidemia, unspecified: Secondary | ICD-10-CM | POA: Diagnosis not present

## 2022-08-04 DIAGNOSIS — G4733 Obstructive sleep apnea (adult) (pediatric): Secondary | ICD-10-CM | POA: Diagnosis not present

## 2022-08-04 DIAGNOSIS — F0390 Unspecified dementia without behavioral disturbance: Secondary | ICD-10-CM | POA: Diagnosis not present

## 2022-08-04 DIAGNOSIS — I10 Essential (primary) hypertension: Secondary | ICD-10-CM | POA: Diagnosis not present

## 2022-08-04 DIAGNOSIS — N400 Enlarged prostate without lower urinary tract symptoms: Secondary | ICD-10-CM | POA: Diagnosis not present

## 2022-08-04 DIAGNOSIS — C9110 Chronic lymphocytic leukemia of B-cell type not having achieved remission: Secondary | ICD-10-CM | POA: Diagnosis not present

## 2022-08-04 DIAGNOSIS — G8929 Other chronic pain: Secondary | ICD-10-CM | POA: Diagnosis not present

## 2022-08-04 DIAGNOSIS — R32 Unspecified urinary incontinence: Secondary | ICD-10-CM | POA: Diagnosis not present

## 2022-08-04 DIAGNOSIS — G620 Drug-induced polyneuropathy: Secondary | ICD-10-CM | POA: Diagnosis not present

## 2022-08-13 ENCOUNTER — Inpatient Hospital Stay: Payer: HMO | Admitting: Hematology & Oncology

## 2022-08-13 ENCOUNTER — Inpatient Hospital Stay: Payer: HMO

## 2022-08-13 ENCOUNTER — Other Ambulatory Visit (HOSPITAL_COMMUNITY): Payer: Self-pay

## 2022-08-16 ENCOUNTER — Other Ambulatory Visit: Payer: Self-pay

## 2022-08-17 ENCOUNTER — Other Ambulatory Visit (HOSPITAL_COMMUNITY): Payer: Self-pay

## 2022-08-18 ENCOUNTER — Other Ambulatory Visit: Payer: Self-pay

## 2022-08-19 ENCOUNTER — Other Ambulatory Visit: Payer: Self-pay

## 2022-08-20 ENCOUNTER — Other Ambulatory Visit (HOSPITAL_COMMUNITY): Payer: Self-pay

## 2022-08-20 ENCOUNTER — Other Ambulatory Visit: Payer: Self-pay

## 2022-08-23 ENCOUNTER — Other Ambulatory Visit: Payer: Self-pay

## 2022-08-23 ENCOUNTER — Other Ambulatory Visit (HOSPITAL_COMMUNITY): Payer: Self-pay

## 2022-09-08 ENCOUNTER — Other Ambulatory Visit (HOSPITAL_COMMUNITY): Payer: Self-pay

## 2022-09-09 ENCOUNTER — Other Ambulatory Visit: Payer: Self-pay

## 2022-09-10 DIAGNOSIS — G4733 Obstructive sleep apnea (adult) (pediatric): Secondary | ICD-10-CM | POA: Diagnosis not present

## 2022-09-13 NOTE — Progress Notes (Signed)
[facility] of [facility] papers completed and for office pick up by the family.  No fax number available.

## 2022-09-17 ENCOUNTER — Inpatient Hospital Stay: Payer: HMO | Attending: Hematology & Oncology

## 2022-09-17 ENCOUNTER — Inpatient Hospital Stay: Payer: HMO

## 2022-09-17 ENCOUNTER — Inpatient Hospital Stay: Payer: HMO | Admitting: Hematology & Oncology

## 2022-09-20 ENCOUNTER — Other Ambulatory Visit (HOSPITAL_BASED_OUTPATIENT_CLINIC_OR_DEPARTMENT_OTHER): Payer: Self-pay

## 2022-09-23 ENCOUNTER — Other Ambulatory Visit: Payer: Self-pay

## 2022-09-23 ENCOUNTER — Other Ambulatory Visit (HOSPITAL_COMMUNITY): Payer: Self-pay

## 2022-09-23 MED ORDER — MOUNJARO 2.5 MG/0.5ML ~~LOC~~ SOAJ
2.5000 mg | SUBCUTANEOUS | 0 refills | Status: DC
  Filled 2022-09-23 – 2022-09-28 (×2): qty 2, 28d supply, fill #0

## 2022-09-24 ENCOUNTER — Other Ambulatory Visit (HOSPITAL_COMMUNITY): Payer: Self-pay

## 2022-09-25 ENCOUNTER — Other Ambulatory Visit (HOSPITAL_COMMUNITY): Payer: Self-pay

## 2022-09-28 ENCOUNTER — Other Ambulatory Visit (HOSPITAL_COMMUNITY): Payer: Self-pay

## 2022-09-30 ENCOUNTER — Other Ambulatory Visit (HOSPITAL_COMMUNITY): Payer: Self-pay

## 2022-10-01 ENCOUNTER — Other Ambulatory Visit (HOSPITAL_COMMUNITY): Payer: Self-pay

## 2022-10-01 ENCOUNTER — Other Ambulatory Visit: Payer: Self-pay

## 2022-10-02 ENCOUNTER — Other Ambulatory Visit (HOSPITAL_COMMUNITY): Payer: Self-pay

## 2022-10-04 ENCOUNTER — Other Ambulatory Visit (HOSPITAL_BASED_OUTPATIENT_CLINIC_OR_DEPARTMENT_OTHER): Payer: Self-pay

## 2022-10-04 MED ORDER — CHLORTHALIDONE 25 MG PO TABS
25.0000 mg | ORAL_TABLET | Freq: Every morning | ORAL | 0 refills | Status: DC
  Filled 2022-10-04 – 2022-10-19 (×2): qty 90, 90d supply, fill #0

## 2022-10-05 ENCOUNTER — Inpatient Hospital Stay: Payer: HMO | Attending: Hematology & Oncology

## 2022-10-05 ENCOUNTER — Inpatient Hospital Stay (HOSPITAL_BASED_OUTPATIENT_CLINIC_OR_DEPARTMENT_OTHER): Payer: HMO | Admitting: Hematology & Oncology

## 2022-10-05 ENCOUNTER — Inpatient Hospital Stay: Payer: HMO

## 2022-10-05 ENCOUNTER — Other Ambulatory Visit: Payer: Self-pay

## 2022-10-05 ENCOUNTER — Encounter: Payer: Self-pay | Admitting: Hematology & Oncology

## 2022-10-05 ENCOUNTER — Other Ambulatory Visit (HOSPITAL_COMMUNITY): Payer: Self-pay

## 2022-10-05 VITALS — BP 135/86 | HR 85 | Temp 97.8°F | Resp 18 | Ht 72.0 in | Wt 294.0 lb

## 2022-10-05 DIAGNOSIS — C9110 Chronic lymphocytic leukemia of B-cell type not having achieved remission: Secondary | ICD-10-CM | POA: Insufficient documentation

## 2022-10-05 DIAGNOSIS — Z452 Encounter for adjustment and management of vascular access device: Secondary | ICD-10-CM | POA: Diagnosis not present

## 2022-10-05 DIAGNOSIS — C8338 Diffuse large B-cell lymphoma, lymph nodes of multiple sites: Secondary | ICD-10-CM | POA: Diagnosis not present

## 2022-10-05 DIAGNOSIS — Z95828 Presence of other vascular implants and grafts: Secondary | ICD-10-CM

## 2022-10-05 LAB — CBC WITH DIFFERENTIAL (CANCER CENTER ONLY)
Abs Immature Granulocytes: 0 10*3/uL (ref 0.00–0.07)
Basophils Absolute: 0 10*3/uL (ref 0.0–0.1)
Basophils Relative: 1 %
Eosinophils Absolute: 0.1 10*3/uL (ref 0.0–0.5)
Eosinophils Relative: 3 %
HCT: 38.8 % — ABNORMAL LOW (ref 39.0–52.0)
Hemoglobin: 13.9 g/dL (ref 13.0–17.0)
Immature Granulocytes: 0 %
Lymphocytes Relative: 28 %
Lymphs Abs: 1.1 10*3/uL (ref 0.7–4.0)
MCH: 30.8 pg (ref 26.0–34.0)
MCHC: 35.8 g/dL (ref 30.0–36.0)
MCV: 86 fL (ref 80.0–100.0)
Monocytes Absolute: 0.5 10*3/uL (ref 0.1–1.0)
Monocytes Relative: 13 %
Neutro Abs: 2.2 10*3/uL (ref 1.7–7.7)
Neutrophils Relative %: 55 %
Platelet Count: 187 10*3/uL (ref 150–400)
RBC: 4.51 MIL/uL (ref 4.22–5.81)
RDW: 13 % (ref 11.5–15.5)
WBC Count: 4 10*3/uL (ref 4.0–10.5)
nRBC: 0 % (ref 0.0–0.2)

## 2022-10-05 LAB — CMP (CANCER CENTER ONLY)
ALT: 33 U/L (ref 0–44)
AST: 25 U/L (ref 15–41)
Albumin: 4.5 g/dL (ref 3.5–5.0)
Alkaline Phosphatase: 46 U/L (ref 38–126)
Anion gap: 12 (ref 5–15)
BUN: 18 mg/dL (ref 8–23)
CO2: 30 mmol/L (ref 22–32)
Calcium: 9.8 mg/dL (ref 8.9–10.3)
Chloride: 100 mmol/L (ref 98–111)
Creatinine: 1.29 mg/dL — ABNORMAL HIGH (ref 0.61–1.24)
GFR, Estimated: 58 mL/min — ABNORMAL LOW (ref >60)
Glucose, Bld: 192 mg/dL — ABNORMAL HIGH (ref 70–99)
Potassium: 3.4 mmol/L — ABNORMAL LOW (ref 3.5–5.1)
Sodium: 142 mmol/L (ref 135–145)
Total Bilirubin: 0.7 mg/dL (ref 0.3–1.2)
Total Protein: 6.8 g/dL (ref 6.5–8.1)

## 2022-10-05 LAB — LACTATE DEHYDROGENASE: LDH: 153 U/L (ref 98–192)

## 2022-10-05 MED ORDER — SODIUM CHLORIDE 0.9% FLUSH
10.0000 mL | INTRAVENOUS | Status: DC | PRN
  Administered 2022-10-05: 10 mL via INTRAVENOUS

## 2022-10-05 MED ORDER — HEPARIN SOD (PORK) LOCK FLUSH 100 UNIT/ML IV SOLN
500.0000 [IU] | Freq: Once | INTRAVENOUS | Status: AC
  Administered 2022-10-05: 500 [IU] via INTRAVENOUS

## 2022-10-05 MED ORDER — TAMSULOSIN HCL 0.4 MG PO CAPS
0.4000 mg | ORAL_CAPSULE | Freq: Every day | ORAL | 2 refills | Status: AC
  Filled 2022-10-05: qty 90, 90d supply, fill #0
  Filled 2023-02-07: qty 90, 90d supply, fill #1
  Filled 2023-08-22: qty 90, 90d supply, fill #2

## 2022-10-05 NOTE — Progress Notes (Signed)
Hematology and Oncology Follow Up Visit  Beauden Huffine [id number] 1946-02-02 77 y.o. 10/05/2022   Principle Diagnosis:  Diffuse large cell non-Hodgkin's lymphoma-Richter's transformation from CLL CLL recurrence   Current Therapy:        R-CHOP-s/p cycle #8-- started on 11/08/2018 XRT to the LEFT neck -- 4500 rad -- completed on 08/01/2019 Acalabrutinib 100 mg po BID -- start on 11/04/2020 -- on hold since 12/2021   Interim History:  Mr. Pinn is here today with his wife for follow-up. I am surprised is a fact that his daughter, who is actually deaf, was up from [place].  It was nice to have her with this.  We last saw him back in January.  After that time, he did have a PET scan that was done in February.  The PET scan did not show anything that looked as if was active.  He still has problems with his diabetes.  I think he is on Mounjaro now.  His weight is up quite a bit.  Again this is not going to help him.  He comes in a wheelchair.  He still has some issues with strengthening and mobility.  He has had no problems with bowels or bladder.  Sometimes he has little bit of incontinence because he cannot get to the bathroom fast enough.  He has had no fever.  He has had no problems with COVID.  There is been no bleeding.  He has had no cough or shortness of breath.  Overall, I would say his performance status is probably ECOG 2.    Medications:  Allergies as of 10/05/2022   No Known Allergies      Medication List        Accurate as of Oct 05, 2022 12:17 PM. If you have any questions, ask your nurse or doctor.          STOP taking these medications    Calquence 100 MG tablet Generic drug: acalabrutinib maleate Stopped by: Josph Macho, MD   lisinopril 40 MG tablet Commonly known as: ZESTRIL Stopped by: Josph Macho, MD   loratadine 10 MG tablet Commonly known as: CLARITIN Stopped by: Josph Macho, MD   Na Sulfate-K Sulfate-Mg Sulf 17.5-3.13-1.6  GM/177ML Soln Stopped by: Josph Macho, MD   Ozempic (0.25 or 0.5 MG/DOSE) 2 MG/1.5ML Sopn Generic drug: Semaglutide(0.25 or 0.5MG /DOS) Stopped by: Josph Macho, MD   venetoclax 100 MG tablet Commonly known as: VENCLEXTA Stopped by: Josph Macho, MD       TAKE these medications    acetaminophen 500 MG tablet Commonly known as: TYLENOL Take 1,000 mg by mouth every 6 (six) hours as needed (pain).   allopurinol 300 MG tablet Commonly known as: ZYLOPRIM Take 300 mg by mouth daily.   aspirin EC 81 MG tablet Take 81 mg by mouth daily.   atenolol 25 MG tablet Commonly known as: TENORMIN Take 1 tablet (25 mg total) by mouth daily.   atorvastatin 40 MG tablet Commonly known as: LIPITOR Take 1 tablet (40 mg total) by mouth daily.   chlorthalidone 25 MG tablet Commonly known as: HYGROTON Take 1 tablet (25 mg total) by mouth in the morning with food.   CINNAMON PO Take 1 tablet by mouth daily.   diclofenac sodium 1 % Gel Commonly known as: VOLTAREN Apply 1 application topically as needed (pain).   donepezil 5 MG disintegrating tablet Commonly known as: ARICEPT ODT Take 5 mg by mouth at bedtime.  famotidine 20 MG tablet Commonly known as: PEPCID Take 20 mg by mouth at bedtime as needed for heartburn.   ipratropium 0.06 % nasal spray Commonly known as: ATROVENT Place 1 spray into both nostrils daily as needed for rhinitis.   metFORMIN 500 MG 24 hr tablet Commonly known as: GLUCOPHAGE-XR Take 2 tablets (1,000 mg total) by mouth 2 (two) times daily with food What changed: Another medication with the same name was removed. Continue taking this medication, and follow the directions you see here. Changed by: Josph Macho, MD   Mounjaro 2.5 MG/0.5ML Pen Generic drug: tirzepatide Inject 2.5 mg into the skin once a week.   OneTouch Delica Plus Lancet33G Misc   potassium chloride 10 MEQ tablet Commonly known as: KLOR-CON M Take 1 tablet (10 mEq total) by  mouth daily.   pregabalin 75 MG capsule Commonly known as: LYRICA Take 2 capsules (150 mg total) by mouth 2 (two) times daily.   pyridoxine 250 MG tablet Commonly known as: B-6 Take 250 mg by mouth daily.   tadalafil 10 MG tablet Commonly known as: CIALIS Take 1 tablet (10 mg total) by mouth daily as needed.   tamsulosin 0.4 MG Caps capsule Commonly known as: FLOMAX Take 1 capsule (0.4 mg total) by mouth daily.   traMADol 50 MG tablet Commonly known as: ULTRAM Take 1 tablet (50 mg total) by mouth 4 (four) times daily as needed.   VITAMIN D3 PO Take 1 tablet by mouth daily.        Allergies: No Known Allergies  Past Medical History, Surgical history, Social history, and Family History were reviewed and updated.  Review of Systems: Review of Systems  Constitutional: Negative.   HENT: Negative.    Eyes: Negative.   Respiratory: Negative.    Gastrointestinal: Negative.   Genitourinary: Negative.   Musculoskeletal:  Positive for joint pain and myalgias.  Skin: Negative.   Neurological: Negative.   Endo/Heme/Allergies: Negative.   Psychiatric/Behavioral: Negative.       Physical Exam:  height is 6' (1.829 m) and weight is 294 lb (133.4 kg). His oral temperature is 97.8 F (36.6 C). His blood pressure is 135/86 and his pulse is 85. His respiration is 18 and oxygen saturation is 96%.   Wt Readings from Last 3 Encounters:  10/05/22 294 lb (133.4 kg)  02/04/22 273 lb (123.8 kg)  11/20/21 286 lb (129.7 kg)  Physical Exam Vitals reviewed.  HENT:     Head: Normocephalic and atraumatic.  Eyes:     Pupils: Pupils are equal, round, and reactive to light.  Cardiovascular:     Rate and Rhythm: Normal rate and regular rhythm.     Heart sounds: Normal heart sounds.  Pulmonary:     Effort: Pulmonary effort is normal.     Breath sounds: Normal breath sounds.  Abdominal:     General: Bowel sounds are normal.     Palpations: Abdomen is soft.  Musculoskeletal:         General: No tenderness or deformity. Normal range of motion.     Cervical back: Normal range of motion.  Lymphadenopathy:     Cervical: No cervical adenopathy.  Skin:    General: Skin is warm and dry.     Findings: No erythema or rash.  Neurological:     Mental Status: He is alert and oriented to person, place, and time.  Psychiatric:        Behavior: Behavior normal.  Thought Content: Thought content normal.        Judgment: Judgment normal.      Physical Activity: Not on file     Lab Results  Component Value Date   WBC 4.0 10/05/2022   HGB 13.9 10/05/2022   HCT 38.8 (L) 10/05/2022   MCV 86.0 10/05/2022   PLT 187 10/05/2022   No results found for: "FERRITIN", "IRON", "TIBC", "UIBC", "IRONPCTSAT" Lab Results  Component Value Date   RBC 4.51 10/05/2022   No results found for: "KPAFRELGTCHN", "LAMBDASER", "KAPLAMBRATIO" Lab Results  Component Value Date   IGGSERUM 466 (L) 02/04/2022   IGA 497 (H) 02/04/2022   IGMSERUM 46 02/04/2022   No results found for: "TOTALPROTELP", "ALBUMINELP", "A1GS", "A2GS", "BETS", "BETA2SER", "GAMS", "MSPIKE", "SPEI"   Chemistry      Component Value Date/Time   NA 140 05/28/2022 1350   NA 142 01/25/2017 1217   K 3.6 05/28/2022 1350   K 4.1 01/25/2017 1217   CL 99 05/28/2022 1350   CL 102 01/25/2017 1217   CO2 31 05/28/2022 1350   CO2 33 01/25/2017 1217   BUN 13 05/28/2022 1350   BUN 12 01/25/2017 1217   CREATININE 1.20 05/28/2022 1350   CREATININE 1.4 (H) 01/25/2017 1217      Component Value Date/Time   CALCIUM 9.4 05/28/2022 1350   CALCIUM 9.7 01/25/2017 1217   ALKPHOS 45 05/28/2022 1350   ALKPHOS 59 01/25/2017 1217   AST 23 05/28/2022 1350   ALT 29 05/28/2022 1350   ALT 32 01/25/2017 1217   BILITOT 0.7 05/28/2022 1350       Impression and Plan: Mr. Kobashigawa is a very pleasant 77 yo African American gentleman with history of CLL and then transformed to a large cell non-Hodgkin's lymphoma. He was treated with  radiation for bulky disease in the neck.   He completed chemo back in December 2020 followed by radiation in February 2021.   He subsequently now has recurrent CLL.  We had him on acalabrutinib.  This has been on hold for 9 months.  The last PET scan looked encouraging.  We will continue to follow him along and I do not see that we have to do any scans on him unless there is problems with his blood or if he is symptomatic.  Again, his weight and diabetes will be a problem if not under better control.  I would like to get him through the Summer and see him back in September.  Josph Macho, MD 5/14/202412:17 PM

## 2022-10-12 ENCOUNTER — Other Ambulatory Visit (HOSPITAL_BASED_OUTPATIENT_CLINIC_OR_DEPARTMENT_OTHER): Payer: Self-pay

## 2022-10-15 ENCOUNTER — Other Ambulatory Visit (HOSPITAL_BASED_OUTPATIENT_CLINIC_OR_DEPARTMENT_OTHER): Payer: Self-pay

## 2022-10-19 ENCOUNTER — Other Ambulatory Visit (HOSPITAL_BASED_OUTPATIENT_CLINIC_OR_DEPARTMENT_OTHER): Payer: Self-pay

## 2022-10-21 ENCOUNTER — Other Ambulatory Visit (HOSPITAL_COMMUNITY): Payer: Self-pay

## 2022-10-26 ENCOUNTER — Other Ambulatory Visit (HOSPITAL_BASED_OUTPATIENT_CLINIC_OR_DEPARTMENT_OTHER): Payer: Self-pay

## 2022-10-26 ENCOUNTER — Other Ambulatory Visit (HOSPITAL_COMMUNITY): Payer: Self-pay

## 2022-10-26 MED ORDER — TRAMADOL HCL 50 MG PO TABS
50.0000 mg | ORAL_TABLET | Freq: Four times a day (QID) | ORAL | 0 refills | Status: DC | PRN
  Filled 2022-10-29: qty 360, 90d supply, fill #0

## 2022-10-26 MED ORDER — MOUNJARO 2.5 MG/0.5ML ~~LOC~~ SOAJ
2.5000 mg | SUBCUTANEOUS | 0 refills | Status: DC
  Filled 2022-10-26: qty 2, 28d supply, fill #0

## 2022-10-27 ENCOUNTER — Other Ambulatory Visit: Payer: Self-pay

## 2022-10-27 ENCOUNTER — Other Ambulatory Visit (HOSPITAL_COMMUNITY): Payer: Self-pay

## 2022-10-28 ENCOUNTER — Other Ambulatory Visit: Payer: Self-pay

## 2022-10-28 ENCOUNTER — Other Ambulatory Visit (HOSPITAL_COMMUNITY): Payer: Self-pay

## 2022-10-28 MED ORDER — METFORMIN HCL ER 500 MG PO TB24
1000.0000 mg | ORAL_TABLET | Freq: Two times a day (BID) | ORAL | 0 refills | Status: DC
  Filled 2022-10-28: qty 360, 90d supply, fill #0

## 2022-10-29 ENCOUNTER — Other Ambulatory Visit (HOSPITAL_BASED_OUTPATIENT_CLINIC_OR_DEPARTMENT_OTHER): Payer: Self-pay

## 2022-10-29 ENCOUNTER — Other Ambulatory Visit: Payer: Self-pay

## 2022-10-29 ENCOUNTER — Other Ambulatory Visit (HOSPITAL_COMMUNITY): Payer: Self-pay

## 2022-11-01 ENCOUNTER — Other Ambulatory Visit (HOSPITAL_BASED_OUTPATIENT_CLINIC_OR_DEPARTMENT_OTHER): Payer: Self-pay

## 2022-11-01 MED ORDER — PREGABALIN 75 MG PO CAPS
150.0000 mg | ORAL_CAPSULE | Freq: Two times a day (BID) | ORAL | 0 refills | Status: DC
  Filled 2022-11-02: qty 120, 30d supply, fill #0

## 2022-11-02 ENCOUNTER — Other Ambulatory Visit (HOSPITAL_BASED_OUTPATIENT_CLINIC_OR_DEPARTMENT_OTHER): Payer: Self-pay

## 2022-11-02 ENCOUNTER — Other Ambulatory Visit: Payer: Self-pay

## 2022-11-05 ENCOUNTER — Other Ambulatory Visit (HOSPITAL_COMMUNITY): Payer: Self-pay

## 2022-11-24 ENCOUNTER — Other Ambulatory Visit (HOSPITAL_COMMUNITY): Payer: Self-pay

## 2022-11-24 ENCOUNTER — Other Ambulatory Visit: Payer: Self-pay

## 2022-11-24 MED ORDER — MOUNJARO 2.5 MG/0.5ML ~~LOC~~ SOAJ
2.5000 mg | SUBCUTANEOUS | 0 refills | Status: DC
  Filled 2022-11-24: qty 2, 28d supply, fill #0

## 2022-11-26 ENCOUNTER — Other Ambulatory Visit: Payer: Self-pay

## 2022-11-26 ENCOUNTER — Other Ambulatory Visit (HOSPITAL_COMMUNITY): Payer: Self-pay

## 2022-11-26 MED ORDER — PREGABALIN 75 MG PO CAPS
150.0000 mg | ORAL_CAPSULE | Freq: Two times a day (BID) | ORAL | 0 refills | Status: DC
  Filled 2022-11-26 – 2022-11-30 (×2): qty 120, 30d supply, fill #0

## 2022-11-30 ENCOUNTER — Other Ambulatory Visit (HOSPITAL_COMMUNITY): Payer: Self-pay

## 2022-12-01 DIAGNOSIS — F0393 Unspecified dementia, unspecified severity, with mood disturbance: Secondary | ICD-10-CM | POA: Diagnosis not present

## 2022-12-01 DIAGNOSIS — E119 Type 2 diabetes mellitus without complications: Secondary | ICD-10-CM | POA: Diagnosis not present

## 2022-12-01 DIAGNOSIS — C8590 Non-Hodgkin lymphoma, unspecified, unspecified site: Secondary | ICD-10-CM | POA: Diagnosis not present

## 2022-12-01 DIAGNOSIS — I10 Essential (primary) hypertension: Secondary | ICD-10-CM | POA: Diagnosis not present

## 2022-12-03 ENCOUNTER — Other Ambulatory Visit (HOSPITAL_COMMUNITY): Payer: Self-pay

## 2022-12-04 ENCOUNTER — Other Ambulatory Visit (HOSPITAL_COMMUNITY): Payer: Self-pay

## 2022-12-10 DIAGNOSIS — G4733 Obstructive sleep apnea (adult) (pediatric): Secondary | ICD-10-CM | POA: Diagnosis not present

## 2022-12-14 DIAGNOSIS — E119 Type 2 diabetes mellitus without complications: Secondary | ICD-10-CM | POA: Diagnosis not present

## 2022-12-14 DIAGNOSIS — H16223 Keratoconjunctivitis sicca, not specified as Sjogren's, bilateral: Secondary | ICD-10-CM | POA: Diagnosis not present

## 2022-12-14 DIAGNOSIS — H40021 Open angle with borderline findings, high risk, right eye: Secondary | ICD-10-CM | POA: Diagnosis not present

## 2022-12-14 DIAGNOSIS — H25813 Combined forms of age-related cataract, bilateral: Secondary | ICD-10-CM | POA: Diagnosis not present

## 2022-12-21 ENCOUNTER — Other Ambulatory Visit (HOSPITAL_COMMUNITY): Payer: Self-pay

## 2022-12-22 ENCOUNTER — Other Ambulatory Visit (HOSPITAL_COMMUNITY): Payer: Self-pay

## 2022-12-22 MED ORDER — PREGABALIN 75 MG PO CAPS: ORAL_CAPSULE | ORAL | 0 refills | Status: DC

## 2022-12-23 ENCOUNTER — Other Ambulatory Visit (HOSPITAL_COMMUNITY): Payer: Self-pay

## 2022-12-27 ENCOUNTER — Other Ambulatory Visit (HOSPITAL_COMMUNITY): Payer: Self-pay

## 2022-12-27 MED ORDER — DONEPEZIL HCL 5 MG PO TABS
5.0000 mg | ORAL_TABLET | Freq: Every day | ORAL | 3 refills | Status: AC
  Filled 2022-12-27: qty 90, 90d supply, fill #0
  Filled 2023-04-15: qty 90, 90d supply, fill #1
  Filled 2023-08-22: qty 90, 90d supply, fill #2

## 2022-12-29 ENCOUNTER — Emergency Department (HOSPITAL_COMMUNITY): Payer: No Typology Code available for payment source

## 2022-12-29 ENCOUNTER — Other Ambulatory Visit: Payer: Self-pay

## 2022-12-29 ENCOUNTER — Encounter (HOSPITAL_COMMUNITY): Payer: HMO

## 2022-12-29 ENCOUNTER — Encounter (HOSPITAL_COMMUNITY): Payer: Self-pay

## 2022-12-29 ENCOUNTER — Inpatient Hospital Stay (HOSPITAL_COMMUNITY)
Admission: EM | Admit: 2022-12-29 | Discharge: 2023-01-03 | DRG: 100 | Disposition: A | Discharge status: Home Health | Payer: No Typology Code available for payment source | Attending: Student | Admitting: Student

## 2022-12-29 ENCOUNTER — Inpatient Hospital Stay (HOSPITAL_COMMUNITY): Payer: No Typology Code available for payment source

## 2022-12-29 DIAGNOSIS — Z87891 Personal history of nicotine dependence: Secondary | ICD-10-CM

## 2022-12-29 DIAGNOSIS — G4733 Obstructive sleep apnea (adult) (pediatric): Secondary | ICD-10-CM | POA: Diagnosis present

## 2022-12-29 DIAGNOSIS — G4730 Sleep apnea, unspecified: Secondary | ICD-10-CM | POA: Diagnosis present

## 2022-12-29 DIAGNOSIS — Z7982 Long term (current) use of aspirin: Secondary | ICD-10-CM | POA: Diagnosis not present

## 2022-12-29 DIAGNOSIS — K219 Gastro-esophageal reflux disease without esophagitis: Secondary | ICD-10-CM | POA: Diagnosis present

## 2022-12-29 DIAGNOSIS — E1140 Type 2 diabetes mellitus with diabetic neuropathy, unspecified: Secondary | ICD-10-CM | POA: Diagnosis present

## 2022-12-29 DIAGNOSIS — E876 Hypokalemia: Secondary | ICD-10-CM | POA: Diagnosis present

## 2022-12-29 DIAGNOSIS — N400 Enlarged prostate without lower urinary tract symptoms: Secondary | ICD-10-CM | POA: Diagnosis present

## 2022-12-29 DIAGNOSIS — Z1152 Encounter for screening for COVID-19: Secondary | ICD-10-CM | POA: Diagnosis not present

## 2022-12-29 DIAGNOSIS — G40909 Epilepsy, unspecified, not intractable, without status epilepticus: Secondary | ICD-10-CM | POA: Diagnosis not present

## 2022-12-29 DIAGNOSIS — R6510 Systemic inflammatory response syndrome (SIRS) of non-infectious origin without acute organ dysfunction: Secondary | ICD-10-CM | POA: Diagnosis present

## 2022-12-29 DIAGNOSIS — C9110 Chronic lymphocytic leukemia of B-cell type not having achieved remission: Secondary | ICD-10-CM | POA: Diagnosis present

## 2022-12-29 DIAGNOSIS — G039 Meningitis, unspecified: Secondary | ICD-10-CM

## 2022-12-29 DIAGNOSIS — E785 Hyperlipidemia, unspecified: Secondary | ICD-10-CM | POA: Diagnosis present

## 2022-12-29 DIAGNOSIS — M109 Gout, unspecified: Secondary | ICD-10-CM | POA: Diagnosis present

## 2022-12-29 DIAGNOSIS — Z8572 Personal history of non-Hodgkin lymphomas: Secondary | ICD-10-CM | POA: Diagnosis not present

## 2022-12-29 DIAGNOSIS — R0689 Other abnormalities of breathing: Secondary | ICD-10-CM | POA: Diagnosis not present

## 2022-12-29 DIAGNOSIS — R0902 Hypoxemia: Secondary | ICD-10-CM | POA: Diagnosis not present

## 2022-12-29 DIAGNOSIS — F0390 Unspecified dementia without behavioral disturbance: Secondary | ICD-10-CM | POA: Diagnosis present

## 2022-12-29 DIAGNOSIS — Z79899 Other long term (current) drug therapy: Secondary | ICD-10-CM

## 2022-12-29 DIAGNOSIS — Z981 Arthrodesis status: Secondary | ICD-10-CM

## 2022-12-29 DIAGNOSIS — Z7985 Long-term (current) use of injectable non-insulin antidiabetic drugs: Secondary | ICD-10-CM

## 2022-12-29 DIAGNOSIS — R569 Unspecified convulsions: Secondary | ICD-10-CM | POA: Diagnosis not present

## 2022-12-29 DIAGNOSIS — R4182 Altered mental status, unspecified: Secondary | ICD-10-CM | POA: Diagnosis not present

## 2022-12-29 DIAGNOSIS — G319 Degenerative disease of nervous system, unspecified: Secondary | ICD-10-CM | POA: Diagnosis not present

## 2022-12-29 DIAGNOSIS — Z833 Family history of diabetes mellitus: Secondary | ICD-10-CM

## 2022-12-29 DIAGNOSIS — R509 Fever, unspecified: Secondary | ICD-10-CM | POA: Diagnosis not present

## 2022-12-29 DIAGNOSIS — I10 Essential (primary) hypertension: Secondary | ICD-10-CM | POA: Diagnosis not present

## 2022-12-29 DIAGNOSIS — E1165 Type 2 diabetes mellitus with hyperglycemia: Secondary | ICD-10-CM | POA: Diagnosis present

## 2022-12-29 DIAGNOSIS — Z823 Family history of stroke: Secondary | ICD-10-CM | POA: Diagnosis not present

## 2022-12-29 DIAGNOSIS — Z8249 Family history of ischemic heart disease and other diseases of the circulatory system: Secondary | ICD-10-CM

## 2022-12-29 DIAGNOSIS — N179 Acute kidney failure, unspecified: Secondary | ICD-10-CM | POA: Diagnosis present

## 2022-12-29 DIAGNOSIS — A419 Sepsis, unspecified organism: Secondary | ICD-10-CM | POA: Diagnosis not present

## 2022-12-29 DIAGNOSIS — Z6841 Body Mass Index (BMI) 40.0 and over, adult: Secondary | ICD-10-CM | POA: Diagnosis not present

## 2022-12-29 DIAGNOSIS — G9341 Metabolic encephalopathy: Secondary | ICD-10-CM | POA: Diagnosis present

## 2022-12-29 DIAGNOSIS — I25110 Atherosclerotic heart disease of native coronary artery with unstable angina pectoris: Secondary | ICD-10-CM | POA: Diagnosis not present

## 2022-12-29 DIAGNOSIS — R29818 Other symptoms and signs involving the nervous system: Secondary | ICD-10-CM | POA: Diagnosis not present

## 2022-12-29 DIAGNOSIS — I2510 Atherosclerotic heart disease of native coronary artery without angina pectoris: Secondary | ICD-10-CM | POA: Diagnosis present

## 2022-12-29 DIAGNOSIS — R000 Tachycardia, unspecified: Secondary | ICD-10-CM | POA: Diagnosis not present

## 2022-12-29 LAB — CBC
HCT: 40 % (ref 39.0–52.0)
Hemoglobin: 14.6 g/dL (ref 13.0–17.0)
MCH: 31.9 pg (ref 26.0–34.0)
MCHC: 36.5 g/dL — ABNORMAL HIGH (ref 30.0–36.0)
MCV: 87.3 fL (ref 80.0–100.0)
Platelets: 165 10*3/uL (ref 150–400)
RBC: 4.58 MIL/uL (ref 4.22–5.81)
RDW: 13.1 % (ref 11.5–15.5)
WBC: 11.7 10*3/uL — ABNORMAL HIGH (ref 4.0–10.5)
nRBC: 0 % (ref 0.0–0.2)

## 2022-12-29 LAB — HEMOGLOBIN A1C
Hgb A1c MFr Bld: 6.9 % — ABNORMAL HIGH (ref 4.8–5.6)
Mean Plasma Glucose: 151.33 mg/dL

## 2022-12-29 LAB — DIFFERENTIAL
Abs Immature Granulocytes: 0.07 10*3/uL (ref 0.00–0.07)
Basophils Absolute: 0 10*3/uL (ref 0.0–0.1)
Basophils Relative: 0 %
Eosinophils Absolute: 0 10*3/uL (ref 0.0–0.5)
Eosinophils Relative: 0 %
Immature Granulocytes: 1 %
Lymphocytes Relative: 5 %
Lymphs Abs: 0.5 10*3/uL — ABNORMAL LOW (ref 0.7–4.0)
Monocytes Absolute: 0.8 10*3/uL (ref 0.1–1.0)
Monocytes Relative: 7 %
Neutro Abs: 10.3 10*3/uL — ABNORMAL HIGH (ref 1.7–7.7)
Neutrophils Relative %: 87 %

## 2022-12-29 LAB — COMPREHENSIVE METABOLIC PANEL
ALT: 41 U/L (ref 0–44)
AST: 33 U/L (ref 15–41)
Albumin: 4 g/dL (ref 3.5–5.0)
Alkaline Phosphatase: 41 U/L (ref 38–126)
Anion gap: 12 (ref 5–15)
BUN: 13 mg/dL (ref 8–23)
CO2: 26 mmol/L (ref 22–32)
Calcium: 8.9 mg/dL (ref 8.9–10.3)
Chloride: 99 mmol/L (ref 98–111)
Creatinine, Ser: 1.41 mg/dL — ABNORMAL HIGH (ref 0.61–1.24)
GFR, Estimated: 52 mL/min — ABNORMAL LOW (ref >60)
Glucose, Bld: 205 mg/dL — ABNORMAL HIGH (ref 70–99)
Potassium: 3.4 mmol/L — ABNORMAL LOW (ref 3.5–5.1)
Sodium: 137 mmol/L (ref 135–145)
Total Bilirubin: 1.2 mg/dL (ref 0.3–1.2)
Total Protein: 6.8 g/dL (ref 6.5–8.1)

## 2022-12-29 LAB — ETHANOL: Alcohol, Ethyl (B): <10 mg/dL (ref <10)

## 2022-12-29 LAB — APTT: aPTT: 27 seconds (ref 24–36)

## 2022-12-29 LAB — URINALYSIS, W/ REFLEX TO CULTURE (INFECTION SUSPECTED)
Bacteria, UA: NONE SEEN
Bilirubin Urine: NEGATIVE
Glucose, UA: NEGATIVE mg/dL
Hgb urine dipstick: NEGATIVE
Ketones, ur: NEGATIVE mg/dL
Leukocytes,Ua: NEGATIVE
Nitrite: NEGATIVE
Protein, ur: NEGATIVE mg/dL
Specific Gravity, Urine: 1.015 (ref 1.005–1.030)
pH: 5 (ref 5.0–8.0)

## 2022-12-29 LAB — BLOOD GAS, VENOUS
Acid-Base Excess: 2.6 mmol/L — ABNORMAL HIGH (ref 0.0–2.0)
Bicarbonate: 27.2 mmol/L (ref 20.0–28.0)
O2 Saturation: 88.2 %
Patient temperature: 38
pCO2, Ven: 43 mmHg — ABNORMAL LOW (ref 44–60)
pH, Ven: 7.42 (ref 7.25–7.43)
pO2, Ven: 63 mmHg — ABNORMAL HIGH (ref 32–45)

## 2022-12-29 LAB — MRSA NEXT GEN BY PCR, NASAL: MRSA by PCR Next Gen: NOT DETECTED

## 2022-12-29 LAB — LACTIC ACID, PLASMA: Lactic Acid, Venous: 2.2 mmol/L (ref 0.5–1.9)

## 2022-12-29 LAB — GLUCOSE, CAPILLARY: Glucose-Capillary: 180 mg/dL — ABNORMAL HIGH (ref 70–99)

## 2022-12-29 LAB — RESP PANEL BY RT-PCR (RSV, FLU A&B, COVID)  RVPGX2
Influenza A by PCR: NEGATIVE
Influenza B by PCR: NEGATIVE
Resp Syncytial Virus by PCR: NEGATIVE
SARS Coronavirus 2 by RT PCR: NEGATIVE

## 2022-12-29 LAB — PROTIME-INR
INR: 1 (ref 0.8–1.2)
Prothrombin Time: 13.8 seconds (ref 11.4–15.2)

## 2022-12-29 MED ORDER — DEXTROSE 5 % IV SOLN
10.0000 mg/kg | Freq: Three times a day (TID) | INTRAVENOUS | Status: DC
  Administered 2022-12-30 (×2): 995 mg via INTRAVENOUS
  Filled 2022-12-29 (×4): qty 19.9

## 2022-12-29 MED ORDER — LEVETIRACETAM IN NACL 1500 MG/100ML IV SOLN
1500.0000 mg | Freq: Once | INTRAVENOUS | Status: AC
  Administered 2022-12-29: 1500 mg via INTRAVENOUS
  Filled 2022-12-29: qty 100

## 2022-12-29 MED ORDER — POLYETHYLENE GLYCOL 3350 17 G PO PACK
17.0000 g | PACK | Freq: Every day | ORAL | Status: DC | PRN
  Administered 2023-01-02: 17 g via ORAL
  Filled 2022-12-29: qty 1

## 2022-12-29 MED ORDER — SODIUM CHLORIDE 0.9 % IV SOLN: INTRAVENOUS | Status: DC | PRN

## 2022-12-29 MED ORDER — LEVETIRACETAM IN NACL 1500 MG/100ML IV SOLN
1500.0000 mg | Freq: Once | INTRAVENOUS | Status: DC
  Administered 2022-12-29: 1500 mg via INTRAVENOUS
  Filled 2022-12-29: qty 100

## 2022-12-29 MED ORDER — SODIUM CHLORIDE 0.9 % IV SOLN
2.0000 g | Freq: Once | INTRAVENOUS | Status: AC
  Administered 2022-12-29: 2 g via INTRAVENOUS
  Filled 2022-12-29: qty 2000

## 2022-12-29 MED ORDER — LACTATED RINGERS IV BOLUS (SEPSIS)
1000.0000 mL | Freq: Once | INTRAVENOUS | Status: DC
  Administered 2022-12-29: 1000 mL via INTRAVENOUS

## 2022-12-29 MED ORDER — SODIUM CHLORIDE 0.9% FLUSH
3.0000 mL | Freq: Once | INTRAVENOUS | Status: AC
  Administered 2022-12-29: 3 mL via INTRAVENOUS

## 2022-12-29 MED ORDER — ACETAMINOPHEN 650 MG RE SUPP
650.0000 mg | Freq: Once | RECTAL | Status: AC
  Administered 2022-12-29: 650 mg via RECTAL
  Filled 2022-12-29: qty 1

## 2022-12-29 MED ORDER — CHLORHEXIDINE GLUCONATE CLOTH 2 % EX PADS
6.0000 | MEDICATED_PAD | Freq: Every day | CUTANEOUS | Status: DC
  Administered 2022-12-30 – 2022-12-31 (×2): 6 via TOPICAL

## 2022-12-29 MED ORDER — INSULIN ASPART 100 UNIT/ML IJ SOLN
0.0000 [IU] | INTRAMUSCULAR | Status: DC
  Administered 2022-12-29: 3 [IU] via SUBCUTANEOUS
  Administered 2022-12-30: 5 [IU] via SUBCUTANEOUS
  Administered 2022-12-30 (×2): 3 [IU] via SUBCUTANEOUS
  Administered 2022-12-30: 2 [IU] via SUBCUTANEOUS
  Administered 2022-12-30: 5 [IU] via SUBCUTANEOUS
  Administered 2022-12-30: 3 [IU] via SUBCUTANEOUS
  Administered 2022-12-31 (×3): 2 [IU] via SUBCUTANEOUS

## 2022-12-29 MED ORDER — VANCOMYCIN HCL 1500 MG/300ML IV SOLN: 1500.0000 mg | INTRAVENOUS | Status: DC

## 2022-12-29 MED ORDER — LACTATED RINGERS IV SOLN: INTRAVENOUS | Status: DC

## 2022-12-29 MED ORDER — LACTATED RINGERS IV BOLUS: 1000.0000 mL | Freq: Once | INTRAVENOUS | Status: DC

## 2022-12-29 MED ORDER — LACTATED RINGERS IV BOLUS
1000.0000 mL | Freq: Once | INTRAVENOUS | Status: AC
  Administered 2022-12-29: 1000 mL via INTRAVENOUS

## 2022-12-29 MED ORDER — DEXTROSE 5 % IV SOLN
10.0000 mg/kg | Freq: Once | INTRAVENOUS | Status: AC
  Administered 2022-12-29: 1000 mg via INTRAVENOUS
  Filled 2022-12-29: qty 20

## 2022-12-29 MED ORDER — SODIUM CHLORIDE 0.9 % IV SOLN
2.0000 g | Freq: Two times a day (BID) | INTRAVENOUS | Status: DC
  Administered 2022-12-29 – 2022-12-30 (×2): 2 g via INTRAVENOUS
  Filled 2022-12-29 (×3): qty 20

## 2022-12-29 MED ORDER — DEXAMETHASONE SODIUM PHOSPHATE 10 MG/ML IJ SOLN
10.0000 mg | Freq: Once | INTRAMUSCULAR | Status: AC
  Administered 2022-12-29: 10 mg via INTRAVENOUS
  Filled 2022-12-29: qty 1

## 2022-12-29 MED ORDER — LACTATED RINGERS IV BOLUS (SEPSIS): 1000.0000 mL | Freq: Once | INTRAVENOUS | Status: DC

## 2022-12-29 MED ORDER — DOCUSATE SODIUM 100 MG PO CAPS
100.0000 mg | ORAL_CAPSULE | Freq: Two times a day (BID) | ORAL | Status: DC | PRN
  Administered 2023-01-01: 100 mg via ORAL
  Filled 2022-12-29: qty 1

## 2022-12-29 MED ORDER — LEVETIRACETAM IN NACL 1500 MG/100ML IV SOLN
1500.0000 mg | Freq: Once | INTRAVENOUS | Status: DC
  Filled 2022-12-29: qty 100

## 2022-12-29 MED ORDER — SODIUM CHLORIDE 0.9 % IV SOLN
2.0000 g | INTRAVENOUS | Status: DC
  Administered 2022-12-29 – 2022-12-30 (×5): 2 g via INTRAVENOUS
  Filled 2022-12-29 (×9): qty 2000

## 2022-12-29 MED ORDER — VANCOMYCIN HCL 2000 MG/400ML IV SOLN
2000.0000 mg | Freq: Once | INTRAVENOUS | Status: AC
  Administered 2022-12-29: 2000 mg via INTRAVENOUS
  Filled 2022-12-29: qty 400

## 2022-12-29 NOTE — Progress Notes (Signed)
LTM EEG running - no initial skin breakdown - push button tested - neuro notified.  

## 2022-12-29 NOTE — Progress Notes (Addendum)
eLink Physician-Brief Progress Note Patient Name: Ronald Rice DOB: [birth year] MRN: [id number]   Date of Service  12/29/2022  HPI/Events of Note  Code sepsis earlier today.  eICU Interventions  Ordered lactic acid per protocol.   2216 -lactic acid 2.2.  Can discontinue further checks.  Intervention Category Minor Interventions: Clinical assessment - ordering diagnostic tests    12/29/2022, 8:26 PM

## 2022-12-29 NOTE — ED Triage Notes (Signed)
Pt BIB EMS due to a code stroke, pt is from home and hx of dementia. Pt is usually conversational. LSN 11pm 8/6. Deficits- right sided gaze, ems witnessed seizure and vomiting. Pt arrives on NRB and unresponsive.

## 2022-12-29 NOTE — Consult Note (Addendum)
Neurology Consultation  Reason for Consult: Seizure activity Referring Physician: Dr. Freida Busman  CC: None  History is obtained from: EMS and chart  HPI: Ronald Rice is a 77 y.o. male with history of diffuse large cell non-Hodgkin's lymphoma, CLL, obesity, diabetes, hypertension, hyperlipidemia, CAD, diverticulosis and sleep apnea who presents with altered mental status and seizure activity.  Patient was last known to be well last night when he went to bed and did not get up this morning as usual.  His wife thought that he was just sleeping in, but when she went to wake him up he would not awaken.  She called EMS, and he had several witnessed seizures while en route to the hospital.  He was given a total of 15 mg of IM Versed.  On arrival to the emergency department, he was noted to be quite lethargic and would only open his eyes to noxious stimuli.  He would not follow commands.  He was noted to be quite febrile with rectal temperature of 103.   LKW: 8/6 2300 TNK given?: no, outside of window IR Thrombectomy? No, exam not consistent with LVO Modified Rankin Scale: 3-Moderate disability-requires help but walks WITHOUT assistance  ROS: Unable to obtain due to altered mental status.   Past Medical History:  Diagnosis Date   Cancer (HCC)    Leukemia    Cervical myelopathy (HCC)    Chronic sinusitis    CLL (chronic lymphocytic leukemia) (HCC)    see records in care [facility] from [facility]   Coronary artery disease    minimal nonobstructive by 10/31/16 cath at [facility] in [place]   Diabetes mellitus without complication (HCC)    Diffuse large B-cell lymphoma of lymph nodes of multiple regions (HCC) 10/31/2018   Diverticulosis    Erectile dysfunction    Fracture    left ankle fracture with retained hardware   GERD (gastroesophageal reflux disease)    Goals of care, counseling/discussion 10/31/2018   Gout    Hyperlipemia    Hypertension    Lumbar radiculopathy    Memory loss    OSA  (obstructive sleep apnea)    Peripheral neuropathy    Renal cyst    Sleep apnea     CPAP   Thyroid nodule    Wears dentures    Wears glasses      Family History  Problem Relation Age of Onset   Heart attack Mother    Stroke Father    Diabetes Sister    Diabetes Brother    Colon cancer Neg Hx    Colon polyps Neg Hx    Esophageal cancer Neg Hx    Stomach cancer Neg Hx    Rectal cancer Neg Hx      Social History:   reports that he quit smoking about 2 years ago. His smoking use included pipe, cigars, and cigarettes. He started smoking about 35 years ago. He has a 0.5 pack-year smoking history. He has never used smokeless tobacco. He reports current alcohol use. He reports that he does not use drugs.  Medications  Current Facility-Administered Medications:    acetaminophen (TYLENOL) suppository 650 mg, 650 mg, Rectal, Once, Lorre Nick, MD   ampicillin (OMNIPEN) 2 g in sodium chloride 0.9 % 100 mL IVPB, 2 g, Intravenous, Once, Lorre Nick, MD, Last Rate: 300 mL/hr at 12/29/22 1714, 2 g at 12/29/22 1714   cefTRIAXone (ROCEPHIN) 2 g in sodium chloride 0.9 % 100 mL IVPB, 2 g, Intravenous, Q12H, Lorre Nick, MD  lactated ringers bolus 1,000 mL, 1,000 mL, Intravenous, Once, Lorre Nick, MD   lactated ringers bolus 1,000 mL, 1,000 mL, Intravenous, Once, Lorre Nick, MD   lactated ringers bolus 1,000 mL, 1,000 mL, Intravenous, Once, Lorre Nick, MD   lactated ringers bolus 1,000 mL, 1,000 mL, Intravenous, Once **AND** lactated ringers bolus 1,000 mL, 1,000 mL, Intravenous, Once **AND** lactated ringers bolus 1,000 mL, 1,000 mL, Intravenous, Once, Lorre Nick, MD   lactated ringers infusion, , Intravenous, Continuous, Lorre Nick, MD   levETIRAcetam (KEPPRA) IVPB 1500 mg/ 100 mL premix, 1,500 mg, Intravenous, Once **AND** levETIRAcetam (KEPPRA) IVPB 1500 mg/ 100 mL premix, 1,500 mg, Intravenous, Once **AND** levETIRAcetam (KEPPRA) IVPB 1500 mg/ 100 mL premix, 1,500  mg, Intravenous, Once, Lorre Nick, MD   vancomycin Luna Kitchens) IVPB 2000 mg/400 mL, 2,000 mg, Intravenous, Once, Lorre Nick, MD  Current Outpatient Medications:    acetaminophen (TYLENOL) 500 MG tablet, Take 1,000 mg by mouth every 6 (six) hours as needed (pain). , Disp: , Rfl:    allopurinol (ZYLOPRIM) 300 MG tablet, Take 300 mg by mouth daily., Disp: , Rfl:    aspirin EC 81 MG tablet, Take 81 mg by mouth daily., Disp: , Rfl:    atenolol (TENORMIN) 25 MG tablet, Take 1 tablet (25 mg total) by mouth daily., Disp: 90 tablet, Rfl: 3   atorvastatin (LIPITOR) 40 MG tablet, Take 1 tablet (40 mg total) by mouth daily., Disp: 90 tablet, Rfl: 11   chlorthalidone (HYGROTON) 25 MG tablet, Take 1 tablet (25 mg total) by mouth in the morning with food., Disp: 90 tablet, Rfl: 0   Cholecalciferol (VITAMIN D3 PO), Take 1 tablet by mouth daily., Disp: , Rfl:    CINNAMON PO, Take 1 tablet by mouth daily., Disp: , Rfl:    diclofenac sodium (VOLTAREN) 1 % GEL, Apply 1 application topically as needed (pain).  (Patient not taking: Reported on 10/05/2022), Disp: , Rfl:    donepezil (ARICEPT ODT) 5 MG disintegrating tablet, Take 5 mg by mouth at bedtime., Disp: , Rfl:    donepezil (ARICEPT) 5 MG tablet, Take 1 tablet by mouth once daily., Disp: 90 tablet, Rfl: 3   famotidine (PEPCID) 20 MG tablet, Take 20 mg by mouth at bedtime as needed for heartburn. (Patient not taking: Reported on 10/05/2022), Disp: , Rfl:    ipratropium (ATROVENT) 0.06 % nasal spray, Place 1 spray into both nostrils daily as needed for rhinitis. (Patient not taking: Reported on 10/05/2022), Disp: , Rfl:    Lancets (ONETOUCH DELICA PLUS LANCET33G) MISC, , Disp: , Rfl:    metFORMIN (GLUCOPHAGE-XR) 500 MG 24 hr tablet, Take 2 tablets (1,000 mg total) by mouth 2 (two) times daily with food., Disp: 360 tablet, Rfl: 0   potassium chloride (KLOR-CON M) 10 MEQ tablet, Take 1 tablet (10 mEq total) by mouth daily., Disp: 90 tablet, Rfl: 10    pregabalin (LYRICA) 75 MG capsule, Take 2 capsules (150 mg total) by mouth 2 (two) times daily., Disp: 120 capsule, Rfl: 0   pyridoxine (B-6) 250 MG tablet, Take 250 mg by mouth daily., Disp: , Rfl:    tadalafil (CIALIS) 10 MG tablet, Take 1 tablet (10 mg total) by mouth daily as needed., Disp: 30 tablet, Rfl: 1   tamsulosin (FLOMAX) 0.4 MG CAPS capsule, Take 1 capsule (0.4 mg total) by mouth daily., Disp: 90 capsule, Rfl: 2   tirzepatide (MOUNJARO) 2.5 MG/0.5ML Pen, Inject 2.5 mg into the skin every 7 (seven) days., Disp: 2 mL, Rfl: 0  traMADol (ULTRAM) 50 MG tablet, Take 1 tablet (50 mg total) by mouth 4 (four) times daily as needed., Disp: 360 tablet, Rfl: 0   Exam: Current vital signs: BP (!) 136/93   Pulse (!) 139   Temp (!) 103 F (39.4 C) (Rectal)   Resp (!) 32   SpO2 95%  Vital signs in last 24 hours: Temp:  [103 F (39.4 C)] 103 F (39.4 C) (08/07 1654) Pulse Rate:  [139] 139 (08/07 1645) Resp:  [32-41] 32 (08/07 1700) BP: (132-136)/(82-93) 136/93 (08/07 1700) SpO2:  [95 %] 95 % (08/07 1645)  GENERAL: Ill-appearing elderly patient in no acute distress Head: Normocephalic and atraumatic, without obvious abnormality LUNGS: Labored respirations with accessory muscle use on supplemental O2 CV: Sinus tachycardia noted on monitor Extremities: warm, well perfused, without obvious deformity  NEURO:  Mental Status: Patient is obtunded does not respond to name or follow commands.  He will move all 4 extremities to noxious stimuli but does not localize.  Pupils are equal round and reactive with right gaze deviation, unable to cross midline.  He does guard his to noxious stimuli and face appears symmetrical at that time.   NIHSS: 1a Level of Conscious.: 2 1b LOC Questions: 2 1c LOC Commands: 2 2 Best Gaze: 2 3 Visual: 3 4 Facial Palsy: 0 5a Motor Arm - left: 2 5b Motor Arm - Right: 2 6a Motor Leg - Left: 3 6b Motor Leg - Right: 3 7 Limb Ataxia: 0 8 Sensory: 0 9 Best  Language: 3 10 Dysarthria: 2 11 Extinct. and Inatten.: 0 TOTAL: 26   Labs I have reviewed labs in epic and the results pertinent to this consultation are:   CBC    Component Value Date/Time   WBC 11.7 (H) 12/29/2022 1645   RBC 4.58 12/29/2022 1645   HGB 14.6 12/29/2022 1645   HGB 13.9 10/05/2022 1139   HGB 15.7 01/25/2017 1217   HCT 40.0 12/29/2022 1645   HCT 43.1 01/25/2017 1217   PLT 165 12/29/2022 1645   PLT 187 10/05/2022 1139   PLT 181 01/25/2017 1217   MCV 87.3 12/29/2022 1645   MCV 86 01/25/2017 1217   MCH 31.9 12/29/2022 1645   MCHC 36.5 (H) 12/29/2022 1645   RDW 13.1 12/29/2022 1645   RDW 13.2 01/25/2017 1217   LYMPHSABS 0.5 (L) 12/29/2022 1645   LYMPHSABS 1.6 01/25/2017 1217   MONOABS 0.8 12/29/2022 1645   EOSABS 0.0 12/29/2022 1645   EOSABS 0.2 01/25/2017 1217   BASOSABS 0.0 12/29/2022 1645   BASOSABS 0.2 01/25/2017 1217    CMP     Component Value Date/Time   NA 142 10/05/2022 1139   NA 142 01/25/2017 1217   K 3.4 (L) 10/05/2022 1139   K 4.1 01/25/2017 1217   CL 100 10/05/2022 1139   CL 102 01/25/2017 1217   CO2 30 10/05/2022 1139   CO2 33 01/25/2017 1217   GLUCOSE 192 (H) 10/05/2022 1139   GLUCOSE 128 (H) 01/25/2017 1217   BUN 18 10/05/2022 1139   BUN 12 01/25/2017 1217   CREATININE 1.29 (H) 10/05/2022 1139   CREATININE 1.4 (H) 01/25/2017 1217   CALCIUM 9.8 10/05/2022 1139   CALCIUM 9.7 01/25/2017 1217   PROT 6.8 10/05/2022 1139   PROT 7.2 01/25/2017 1217   ALBUMIN 4.5 10/05/2022 1139   ALBUMIN 4.2 01/25/2017 1217   AST 25 10/05/2022 1139   ALT 33 10/05/2022 1139   ALT 32 01/25/2017 1217   ALKPHOS 46 10/05/2022 1139  ALKPHOS 59 01/25/2017 1217   BILITOT 0.7 10/05/2022 1139   GFRNONAA 58 (L) 10/05/2022 1139   GFRAA >60 10/18/2019 0915    Lipid Panel     Component Value Date/Time   CHOL 90 11/23/2017 0227   TRIG 72 11/23/2017 0227   HDL 27 (L) 11/23/2017 0227   CHOLHDL 3.3 11/23/2017 0227   VLDL 14 11/23/2017 0227   LDLCALC 49  11/23/2017 0227     Imaging I have reviewed the images obtained:  CT-scan of the brain: No acute intracranial abnormality  MRI examination of the brain: Pending  Assessment: 77 year old patient with history of diffuse large cell non-Hodgkin's lymphoma, CLL, obesity, diabetes, hypertension, hyperlipidemia, CAD, diverticulosis and sleep apnea presents with first altered mental status and then witnessed seizure activity while en route to the hospital.  Patient was treated with 15 mg of IM Versed in the ambulance and consequently was rather obtunded on arrival to the hospital.  Head CT demonstrated no acute abnormalities, however patient was noted to be quite febrile.  Given new onset seizures and fever, concern is high for meningitis and patient will need lumbar puncture.  This will likely need to be performed under fluoroscopy due to patient's body habitus.  Appropriate antibiotics have been started, and patient has been loaded with Keppra for seizure activity.  Will obtain long-term EEG and brain MRI when possible.  Impression: New onset seizure activity, likely due to meningitis  Recommendations: -Appropriate antimicrobials for meningitis coverage, to include ampicillin, ceftriaxone, vancomycin and acyclovir -Dexamethasone 10 mg IV once -Lumbar puncture under fluoroscopy -Long-term EEG -Patient was loaded with 4500 mg of Keppra, will order scheduled AEDs based on EEG results -Lorazepam 4 mg IV for further seizure activity -MRI brain with and without contrast when possible -Treatment of sepsis and respiratory distress per primary team  Pt seen by NP/Neuro and later by MD. Note/plan to be edited by MD as needed.  Cortney E Ernestina Columbia , MSN, AGACNP-BC [facility] See Amion for schedule and pager information 12/29/2022 5:16 PM  NEUROHOSPITALIST ADDENDUM Performed a face to face diagnostic evaluation.   I have reviewed the contents of history and physical exam as documented by  PA/ARNP/Resident and agree with above documentation.  I have discussed and formulated the above plan as documented. Edits to the note have been made as needed.  Impression/Key exam findings/Plan: went to bed last night but slept in vs did not wake up this AM. Wife eventually chekced on him and starring off and poorly responsive. EMS called and noted to have seizures and given 15mg  of IM Versed. Poorly responsive here and barely protecing his airway. Rectal temp of 103, tachycardic to 140s. He was loaded with Keppra 4500mg  IV once, STAT CT Head negative for any acute abnormalities, he was started on empiric coverage with Vanc, ceftriaxone, ampicillin and acyclovir along with a single dose of Decadron 10mg  for suspicion for potential bacterial meningitis. His body habitus and BMI of ~40 precludes bedside LP and will need it under fluoro. Will also get an MRI brain with and without contrast.  Fever, tachycardia, seizures in addition to unresponsiveness, makes basilar thrombosis unlikely.  This patient is critically ill and at significant risk of neurological worsening, death and care requires constant monitoring of vital signs, hemodynamics,respiratory and cardiac monitoring, neurological assessment, discussion with family, other specialists and medical decision making of high complexity. I spent 100 minutes of neurocritical care time  in the care of  this patient. This was time spent  independent of any time provided by nurse practitioner or PA.  Erick Blinks [facility] 12/29/2022  6:36 PM  Erick Blinks, MD [facility] [phone]   If 7pm to 7am, please call on call as listed on AMION.

## 2022-12-29 NOTE — ED Notes (Signed)
Pt now on 3.5L of O2

## 2022-12-29 NOTE — Progress Notes (Signed)
STAT LTM ordered by Neuro but patient was not ready. 3rd shift tech will apply EEG.

## 2022-12-29 NOTE — Sepsis Progress Note (Signed)
Notified bedside nurse of need to draw repeat lactic acid. 

## 2022-12-29 NOTE — Sepsis Progress Note (Signed)
Notified provider of need to order repeat lactic acid. ° °

## 2022-12-29 NOTE — Code Documentation (Signed)
Ronald Rice is a 77 yr old male with PMH of lymphoma, obesity, DM, HTN, CAD, sleep apnea presenting to [facility] on 12/29/2022. Pt is from home where he was [facility] 12/28/2022 2300. Wife just thought he was tired and sleeping, and realized this afternoon that she could not awaken him. EMS called. Pt vomited and seized en route. 15 mg Versed IM given. Code stroke activated for rt gaze preference and AMS. Pt not on any known blood thinner.    Pt met at bridge by team. He was stuporous, with no IV access. Decision made by EDP to room pt first before CT. IV access obtained. Pt became slightly more responsive.Opens eyes, gazes to right, withdraws all extremities from pain.  Pt febrile and tachycardic. Loaded with 3 G Keppra. Stable airway per Dr. Freida Busman. Pt to CT with team. NIHSS 26. Please see documentation for details and timeline. Per Dr. Derry Lory, CT negative for acute abnormality. Pt returned to room where his workup will continue. He will need q 2 hr (at least) BP and NIHSS for 12 hrs then q 4. Pt not eligible for thrombolytic as OOW and stroke not suspected. No thrombectomy as LVO not suspected. Handoff with ED RN complete.

## 2022-12-29 NOTE — Sepsis Progress Note (Signed)
Code Sepsis being monitored by eLink 

## 2022-12-29 NOTE — ED Provider Notes (Signed)
[place] EMERGENCY DEPARTMENT AT [facility] Provider Note   CSN: [id number] Arrival date & time: 12/29/22  1637     History  No chief complaint on file.   Ronald Rice is a 77 y.o. male.  77 year old male presents with altered mental status.  According to EMS, family noted that he had not been very active throughout the day.  When they arrived, patient had seizure activity.  Was given a total of 15 mg of IM midazolam.  They were not able to achieve IV access.  He was warm to the touch.  He arrives unresponsive at this time       Home Medications Prior to Admission medications   Medication Sig Start Date End Date Taking? Authorizing Provider  acetaminophen (TYLENOL) 500 MG tablet Take 1,000 mg by mouth every 6 (six) hours as needed (pain).     [provider]  allopurinol (ZYLOPRIM) 300 MG tablet Take 300 mg by mouth daily.    [provider]  aspirin EC 81 MG tablet Take 81 mg by mouth daily.    [provider]  atenolol (TENORMIN) 25 MG tablet Take 1 tablet (25 mg total) by mouth daily. 02/22/22   Merri Brunette, MD  atorvastatin (LIPITOR) 40 MG tablet Take 1 tablet (40 mg total) by mouth daily. 03/12/22   Merri Brunette, MD  chlorthalidone (HYGROTON) 25 MG tablet Take 1 tablet (25 mg total) by mouth in the morning with food. 10/04/22     Cholecalciferol (VITAMIN D3 PO) Take 1 tablet by mouth daily.    [provider]  CINNAMON PO Take 1 tablet by mouth daily.    [provider]  diclofenac sodium (VOLTAREN) 1 % GEL Apply 1 application topically as needed (pain).  Patient not taking: Reported on 10/05/2022 10/10/18   [provider]  donepezil (ARICEPT ODT) 5 MG disintegrating tablet Take 5 mg by mouth at bedtime. 03/29/19   [provider]  donepezil (ARICEPT) 5 MG tablet Take 1 tablet by mouth once daily. 12/27/22     famotidine (PEPCID) 20 MG tablet Take 20 mg by mouth at bedtime as needed for  heartburn. Patient not taking: Reported on 10/05/2022    [provider]  ipratropium (ATROVENT) 0.06 % nasal spray Place 1 spray into both nostrils daily as needed for rhinitis. Patient not taking: Reported on 10/05/2022 08/29/19   [provider]  Lancets [facility] Larose [place] PLUS [place]) MISC  09/27/19   [provider]  metFORMIN (GLUCOPHAGE-XR) 500 MG 24 hr tablet Take 2 tablets (1,000 mg total) by mouth 2 (two) times daily with food. 10/27/22     potassium chloride (KLOR-CON M) 10 MEQ tablet Take 1 tablet (10 mEq total) by mouth daily. 03/30/22   Merri Brunette, MD  pregabalin (LYRICA) 75 MG capsule Take 2 capsules (150 mg total) by mouth 2 (two) times daily. 12/22/22     pyridoxine (B-6) 250 MG tablet Take 250 mg by mouth daily.    [provider]  tadalafil (CIALIS) 10 MG tablet Take 1 tablet (10 mg total) by mouth daily as needed. 02/09/22     tamsulosin (FLOMAX) 0.4 MG CAPS capsule Take 1 capsule (0.4 mg total) by mouth daily. 10/04/22     tirzepatide (MOUNJARO) 2.5 MG/0.5ML Pen Inject 2.5 mg into the skin every 7 (seven) days. 11/24/22     traMADol (ULTRAM) 50 MG tablet Take 1 tablet (50 mg total) by mouth 4 (four) times daily as needed. 10/26/22  DULoxetine (CYMBALTA) 60 MG capsule Take 1 capsule (60 mg total) by mouth daily. 01/12/22 06/18/22  Josph Macho, MD      Allergies    Patient has no known allergies.    Review of Systems   Review of Systems  Unable to perform ROS: Mental status change    Physical Exam Updated Vital Signs There were no vitals taken for this visit. Physical Exam Vitals and nursing note reviewed.  Constitutional:      General: He is not in acute distress.    Appearance: Normal appearance. He is well-developed. He is not toxic-appearing.  HENT:     Head: Normocephalic and atraumatic.  Eyes:     General: Lids are normal.     Conjunctiva/sclera: Conjunctivae normal.     Pupils: Pupils are equal, round, and reactive to light.   Neck:     Thyroid: No thyroid mass.     Trachea: No tracheal deviation.  Cardiovascular:     Rate and Rhythm: Normal rate and regular rhythm.     Heart sounds: Normal heart sounds. No murmur heard.    No gallop.  Pulmonary:     Effort: Pulmonary effort is normal. No respiratory distress.     Breath sounds: Normal breath sounds. No stridor. No decreased breath sounds, wheezing, rhonchi or rales.  Abdominal:     General: There is no distension.     Palpations: Abdomen is soft.     Tenderness: There is no abdominal tenderness. There is no rebound.  Musculoskeletal:        General: No tenderness. Normal range of motion.     Cervical back: Normal range of motion and neck supple.  Skin:    General: Skin is warm and dry.     Findings: No abrasion or rash.  Neurological:     Mental Status: He is disoriented, confused and unresponsive.     Cranial Nerves: Cranial nerves are intact. No cranial nerve deficit.     Motor: No tremor.     Comments: Withdraws to pain in all 4 extremities  Psychiatric:        Attention and Perception: He is inattentive.     ED Results / Procedures / Treatments   Labs (all labs ordered are listed, but only abnormal results are displayed) Labs Reviewed  CULTURE, BLOOD (ROUTINE X 2)  CULTURE, BLOOD (ROUTINE X 2)  RESP PANEL BY RT-PCR (RSV, FLU A&B, COVID)  RVPGX2  PROTIME-INR  APTT  CBC  DIFFERENTIAL  COMPREHENSIVE METABOLIC PANEL  ETHANOL  COMPREHENSIVE METABOLIC PANEL  CBC WITH DIFFERENTIAL/PLATELET  PROTIME-INR  URINALYSIS, W/ REFLEX TO CULTURE (INFECTION SUSPECTED)  I-STAT CHEM 8, ED  CBG MONITORING, ED  I-STAT CG4 LACTIC ACID, ED    EKG EKG Interpretation Date/Time:  Wednesday December 29 2022 16:48:13 EDT Ventricular Rate:  138 PR Interval:  134 QRS Duration:  81 QT Interval:  292 QTC Calculation: 443 R Axis:   49  Text Interpretation: Sinus tachycardia Abnormal R-wave progression, early transition Repolarization abnormality, prob rate  related Confirmed by Lorre Nick ([id number]) on 12/29/2022 4:51:12 PM  Radiology No results found.  Procedures Procedures    Medications Ordered in ED Medications  levETIRAcetam (KEPPRA) IVPB 1500 mg/ 100 mL premix (has no administration in time range)    And  levETIRAcetam (KEPPRA) IVPB 1500 mg/ 100 mL premix (1,500 mg Intravenous New Bag/Given 12/29/22 1650)  cefTRIAXone (ROCEPHIN) 2 g in sodium chloride 0.9 % 100 mL IVPB (has no administration in time range)  dexamethasone (DECADRON) injection 10 mg (has no administration in time range)  vancomycin (VANCOREADY) IVPB 2000 mg/400 mL (has no administration in time range)  ampicillin (OMNIPEN) 2 g in sodium chloride 0.9 % 100 mL IVPB (has no administration in time range)  lactated ringers bolus 1,000 mL (has no administration in time range)  lactated ringers bolus 1,000 mL (has no administration in time range)  lactated ringers bolus 1,000 mL (has no administration in time range)  acetaminophen (TYLENOL) suppository 650 mg (has no administration in time range)  sodium chloride flush (NS) 0.9 % injection 3 mL (3 mLs Intravenous Given 12/29/22 1646)    ED Course/ Medical Decision Making/ A&P                                 Medical Decision Making Amount and/or Complexity of Data Reviewed Labs: ordered. Radiology: ordered. ECG/medicine tests: ordered.  Risk OTC drugs. Prescription drug management.   Patient is EKG, interpretation shows sinus tachycardia.  Patient is febrile at this time.  Given Tylenol rectally.  Code sepsis ordered.  Patient received empiric treatment for likely meningitis per pharmacy.  Due to patient's body habitus, LP not attempted.  Neurologist at bedside.  Patient has a history of CLL.  Went to CT scanner and had a CT of his head which per my review showed no acute findings.  Patient retracting his airway appropriate this time.  Has good gag reflex.  Remains tachycardic.  Given IV fluid bolus and blood cultures and  lactate been ordered.  Due to patient's mental status as well as potential airway issues, I feel that he needs ICU level of care.  Spoke with intensivist who is going to come and see the patient  CRITICAL CARE Performed by: Toy Baker Total critical care time: 50 minutes Critical care time was exclusive of separately billable procedures and treating other patients. Critical care was necessary to treat or prevent imminent or life-threatening deterioration. Critical care was time spent personally by me on the following activities: development of treatment plan with patient and/or surrogate as well as nursing, discussions with consultants, evaluation of patient's response to treatment, examination of patient, obtaining history from patient or surrogate, ordering and performing treatments and interventions, ordering and review of laboratory studies, ordering and review of radiographic studies, pulse oximetry and re-evaluation of patient's condition.'        Final Clinical Impression(s) / ED Diagnoses Final diagnoses:  None    Rx / DC Orders ED Discharge Orders     None         Lorre Nick, MD 12/29/22 1729

## 2022-12-29 NOTE — H&P (Signed)
NAME:  Ronald Rice, MRN:  [id number], DOB:  1946-02-09, LOS: 0 ADMISSION DATE:  12/29/2022, CONSULTATION DATE:  12/29/2022 REFERRING MD:  Alroy Dust - EDP , CHIEF COMPLAINT:  Seizures with fever    History of Present Illness:  Ronald Rice is a 77 y.o. male with a past medical history significant for CLL converted to diffuse large B-cell lymphoma now with recurrence CLL, CAD, diabetes, hypertension, hyperlipidemia, OSA, and dementia who presented to the ED after being found unresponsive by family.  Per report patient had a multiple episodes of seizure-like activity with EMS resulting in 15 mg of IV Versed administered.  Initially presented as a code stroke.  On ED arrival patient was seen and minimally responsive with temperature of 103, tachycardia and hypertensive.  CT head per stroke protocol negative.  Lab work significant for potassium 3.4, glucose 205, creatinine 1.14, lWBC 11.7.  Given concern for ability to protect airway long term PCCM consulted for further management and admission   Pertinent  Medical History  CLL converted to diffuse large B-cell lymphoma now with recurrence CLL, CAD, diabetes, hypertension, hyperlipidemia, OSA, and dementia  Significant Hospital Events: Including procedures, antibiotic start and stop dates in addition to other pertinent events   8/7 presented with altered mental status with seizure-like activity and fever  Interim History / Subjective:  Minimally responsive but able to currently protect airway with cough and gag present  Objective   Blood pressure (!) 137/97, pulse (!) 122, temperature (!) 102.6 F (39.2 C), temperature source Bladder, resp. rate (!) 32, weight 133.6 kg, SpO2 97%.       No intake or output data in the 24 hours ending 12/29/22 1752 Filed Weights   12/29/22 1727  Weight: 133.6 kg    Examination: General: Acute on chronic ill appearing elderly male lying in bed in NAD HEENT: [place]/AT, MM pink/moist, PERRL,  Neuro: Will open eyes to  deep painful stimuli, cough and gag present  CV: s1s2 regular rate and rhythm, no murmur, rubs, or gallops,  PULM:  Clear to auscultation, no increased work of breathing, GI: soft, bowel sounds active in all 4 quadrants, non-tender, non-distended Extremities: warm/dry, no edema  Skin: no rashes or lesions   Resolved Hospital Problem list     Assessment & Plan:  New onset seizures concerning for meningitis -Received 15 mg IV Versed in route to ED.  Head CT negative Acute encephalopathy P: Neurology following, appreciate assistance For meningitis coverage to include viral and bacterial Given body habitus likely will need to be done with IR LTM With Keppra and continue Seizure precautions Aspiration precautions Further imaging to include MRI per neurology  Severe sepsis -Patient presented with leukocytosis and Tmax of 103 with acute concern for underlying meningitis P: Admit ICU Supplemental oxygen for sat goal > 92, low threshold to intubate for airway protection  Pan cultures prior to antibiotic IV ampicillin, ceftriaxone, and vancomycin IV acyclovir as well Aggressive IV hydration provided on admit  Trend lactic acid  At risk for hypoxic respiratory failure  -Minimally responsive but able to currently protect airway with cough and gag present P: Continue supplemental oxygen  Check VBG Low threshold to intubate Head of bed elevated 30 degrees. Follow intermittent chest x-ray and ABG. Ensure adequate pulmonary hygiene  Follow cultures  Aspiration precautions   Acute Kidney Injury  -Creatinine 1.41 on admit, creatine 1.12 June 2022 P: Follow renal function  Monitor urine output Trend Bmet Avoid nephrotoxins Ensure adequate renal perfusion   Hypokalemia  P: Supplement  Trend Bmet   Type 2 diabetes  P: SSI  CBG goal 140-180 CBG check q4   Best Practice (right click and "Reselect all SmartList Selections" daily)   Diet/type: NPO DVT prophylaxis:  SCD GI prophylaxis: PPI Lines: N/A Foley:  N/A Code Status:  full code Last date of multidisciplinary goals of care discussion: Update patient and family daily   Labs   CBC: Recent Labs  Lab 12/29/22 1645  WBC 11.7*  NEUTROABS 10.3*  HGB 14.6  HCT 40.0  MCV 87.3  PLT 165    Basic Metabolic Panel: Recent Labs  Lab 12/29/22 1645  NA 137  K 3.4*  CL 99  CO2 26  GLUCOSE 205*  BUN 13  CREATININE 1.41*  CALCIUM 8.9   GFR: Estimated Creatinine Clearance: 64 mL/min (A) (by C-G formula based on SCr of 1.41 mg/dL (H)). Recent Labs  Lab 12/29/22 1645  WBC 11.7*    Liver Function Tests: Recent Labs  Lab 12/29/22 1645  AST 33  ALT 41  ALKPHOS 41  BILITOT 1.2  PROT 6.8  ALBUMIN 4.0   No results for input(s): "LIPASE", "AMYLASE" in the last 168 hours. No results for input(s): "AMMONIA" in the last 168 hours.  ABG    Component Value Date/Time   TCO2 22 11/22/2017 1858     Coagulation Profile: Recent Labs  Lab 12/29/22 1645  INR 1.0    Cardiac Enzymes: No results for input(s): "CKTOTAL", "CKMB", "CKMBINDEX", "TROPONINI" in the last 168 hours.  HbA1C: Hgb A1c MFr Bld  Date/Time Value Ref Range Status  09/17/2020 02:40 PM 6.4 (H) 4.8 - 5.6 % Final    Comment:    (NOTE) Pre diabetes:          5.7%-6.4%  Diabetes:              >6.4%  Glycemic control for   <7.0% adults with diabetes   10/18/2019 09:16 AM 6.8 (H) 4.8 - 5.6 % Final    Comment:    (NOTE) Pre diabetes:          5.7%-6.4% Diabetes:              >6.4% Glycemic control for   <7.0% adults with diabetes     CBG: No results for input(s): "GLUCAP" in the last 168 hours.  Review of Systems:   Unable to assess   Past Medical History:  He,  has a past medical history of Cancer (HCC), Cervical myelopathy (HCC), Chronic sinusitis, CLL (chronic lymphocytic leukemia) (HCC), Coronary artery disease, Diabetes mellitus without complication (HCC), Diffuse large B-cell lymphoma of lymph nodes  of multiple regions (HCC) (10/31/2018), Diverticulosis, Erectile dysfunction, Fracture, GERD (gastroesophageal reflux disease), Goals of care, counseling/discussion (10/31/2018), Gout, Hyperlipemia, Hypertension, Lumbar radiculopathy, Memory loss, OSA (obstructive sleep apnea), Peripheral neuropathy, Renal cyst, Sleep apnea, Thyroid nodule, Wears dentures, and Wears glasses.   Surgical History:   Past Surgical History:  Procedure Laterality Date   ANKLE RECONSTRUCTION Left 12/19/2018   Procedure: LEFT ANKLE REVISION FIXATION, REMOVAL OF HARDWARE, OPEN TREATMENT, SYNDESMOSIS, POSSIBLE ANKLE ARTHROTOMY, DELTOID LIGAMENT REPAIR;  Surgeon: Terance Hart, MD;  Location: [facility] OR;  Service: Orthopedics;  Laterality: Left;   ANTERIOR CERVICAL DECOMP/DISCECTOMY FUSION N/A 04/20/2017   Procedure: ANTERIOR CERVICAL DECOMPRESSION FUSION, CERVICAL 3-4, CERVICAL 4-5 WITH INSTRUMENTATION AND ALLOGRAFT; REQUEST 3.5 HOURS;  Surgeon: Estill Bamberg, MD;  Location: [facility] OR;  Service: Orthopedics;  Laterality: N/A;  ANTERIOR CERVICAL DECOMPRESSION FUSION, CERVICAL 3-4, CERVICAL 4-5 WITH INSTRUMENTATION AND  ALLOGRAFT; REQUEST 3.5 HOURS   BACK SURGERY     CARDIAC CATHETERIZATION     in Care Everywhere 11/01/16   COLONOSCOPY  10 years ago   in [place]-normal exam   IR IMAGING GUIDED PORT INSERTION  11/10/2018   IR RADIOLOGIST EVAL & MGMT  10/16/2019   IR RADIOLOGIST EVAL & MGMT  12/20/2019   IR RENAL CYST  11/28/2019   IR RENAL CYST  11/28/2019   IR RENAL CYST  11/28/2019   IR RENAL CYST  11/28/2019   LEG SURGERY Left 2019   from fracture   MULTIPLE TOOTH EXTRACTIONS     ORIF ANKLE FRACTURE Left 09/13/2018   Procedure: OPEN REDUCTION INTERNAL FIXATION (ORIF) LATERAL LEFT ANKLE FRACTURE;  Surgeon: Jodi Geralds, MD;  Location: [facility] OR;  Service: Orthopedics;  Laterality: Left;   SYNDESMOSIS REPAIR Left 09/13/2018   Procedure: OPEN REDUCTION INTERNAL FIXATION SYNDESMOSIS REPAIR LEFT ANKLE;  Surgeon: Jodi Geralds, MD;  Location:  [facility] OR;  Service: Orthopedics;  Laterality: Left;     Social History:   reports that he quit smoking about 2 years ago. His smoking use included pipe, cigars, and cigarettes. He started smoking about 35 years ago. He has a 0.5 pack-year smoking history. He has never used smokeless tobacco. He reports current alcohol use. He reports that he does not use drugs.   Family History:  His family history includes Diabetes in his brother and sister; Heart attack in his mother; Stroke in his father. There is no history of Colon cancer, Colon polyps, Esophageal cancer, Stomach cancer, or Rectal cancer.   Allergies No Known Allergies   Home Medications  Prior to Admission medications   Medication Sig Start Date End Date Taking? Authorizing Provider  acetaminophen (TYLENOL) 500 MG tablet Take 1,000 mg by mouth every 6 (six) hours as needed (pain).     [provider]  allopurinol (ZYLOPRIM) 300 MG tablet Take 300 mg by mouth daily.    [provider]  aspirin EC 81 MG tablet Take 81 mg by mouth daily.    [provider]  atenolol (TENORMIN) 25 MG tablet Take 1 tablet (25 mg total) by mouth daily. 02/22/22   Merri Brunette, MD  atorvastatin (LIPITOR) 40 MG tablet Take 1 tablet (40 mg total) by mouth daily. 03/12/22   Merri Brunette, MD  chlorthalidone (HYGROTON) 25 MG tablet Take 1 tablet (25 mg total) by mouth in the morning with food. 10/04/22     Cholecalciferol (VITAMIN D3 PO) Take 1 tablet by mouth daily.    [provider]  CINNAMON PO Take 1 tablet by mouth daily.    [provider]  diclofenac sodium (VOLTAREN) 1 % GEL Apply 1 application topically as needed (pain).  Patient not taking: Reported on 10/05/2022 10/10/18   [provider]  donepezil (ARICEPT ODT) 5 MG disintegrating tablet Take 5 mg by mouth at bedtime. 03/29/19   [provider]  donepezil (ARICEPT) 5 MG tablet Take 1 tablet by mouth once daily. 12/27/22     famotidine (PEPCID)  20 MG tablet Take 20 mg by mouth at bedtime as needed for heartburn. Patient not taking: Reported on 10/05/2022    [provider]  ipratropium (ATROVENT) 0.06 % nasal spray Place 1 spray into both nostrils daily as needed for rhinitis. Patient not taking: Reported on 10/05/2022 08/29/19   [provider]  Lancets [facility] Larose [place] PLUS [place]) MISC  09/27/19   [provider]  metFORMIN (GLUCOPHAGE-XR) 500 MG 24  hr tablet Take 2 tablets (1,000 mg total) by mouth 2 (two) times daily with food. 10/27/22     potassium chloride (KLOR-CON M) 10 MEQ tablet Take 1 tablet (10 mEq total) by mouth daily. 03/30/22   Merri Brunette, MD  pregabalin (LYRICA) 75 MG capsule Take 2 capsules (150 mg total) by mouth 2 (two) times daily. 12/22/22     pyridoxine (B-6) 250 MG tablet Take 250 mg by mouth daily.    [provider]  tadalafil (CIALIS) 10 MG tablet Take 1 tablet (10 mg total) by mouth daily as needed. 02/09/22     tamsulosin (FLOMAX) 0.4 MG CAPS capsule Take 1 capsule (0.4 mg total) by mouth daily. 10/04/22     tirzepatide (MOUNJARO) 2.5 MG/0.5ML Pen Inject 2.5 mg into the skin every 7 (seven) days. 11/24/22     traMADol (ULTRAM) 50 MG tablet Take 1 tablet (50 mg total) by mouth 4 (four) times daily as needed. 10/26/22     DULoxetine (CYMBALTA) 60 MG capsule Take 1 capsule (60 mg total) by mouth daily. 01/12/22 06/18/22  Josph Macho, MD     Critical care time:  CRITICAL CARE Performed by:  D. Harris  Total critical care time: 42 minutes  Critical care time was exclusive of separately billable procedures and treating other patients.  Critical care was necessary to treat or prevent imminent or life-threatening deterioration.  Critical care was time spent personally by me on the following activities: development of treatment plan with patient and/or surrogate as well as nursing, discussions with consultants, evaluation of patient's response to treatment, examination of  patient, obtaining history from patient or surrogate, ordering and performing treatments and interventions, ordering and review of laboratory studies, ordering and review of radiographic studies, pulse oximetry and re-evaluation of patient's condition.   D. Harris, NP-C [facility] Personal contact information can be found on Amion  If no contact or response made please call [phone] 12/29/2022, 6:10 PM

## 2022-12-29 NOTE — Sepsis Progress Note (Addendum)
iSTAT lactic acids ordered and appear completed. Unable to find results. Secure chat to ED RN.  Add:  iSTAT lactic acids are not crossing over. Reported that 1656 Lactic was 2.3

## 2022-12-29 NOTE — Progress Notes (Signed)
Unable to reach RN on phone, EEG attempted; at room, pt being transported to new room soon as per RN. EEG equipment setup in new room, waiting for pt to be moved.

## 2022-12-29 NOTE — Progress Notes (Signed)
LTM EEG reviewed. Diffuse slowing noted symmetrically. No electrographic seizures seen.   Electronically signed: Dr. [name]

## 2022-12-29 NOTE — Plan of Care (Signed)
Pt's wife [name] updated with patient's current status, she confirms full code.   Darcella Gasman , PA-C

## 2022-12-29 NOTE — Progress Notes (Signed)
ATRIUM NOTIFIED AND MONITORING

## 2022-12-29 NOTE — Progress Notes (Signed)
Pharmacy Antibiotic Note  Ronald Rice is a 77 y.o. male admitted on 12/29/2022 with meningitis.  Pharmacy has been consulted for vancomycin, ampicillin, and acyclovir dosing.  77 yo male who presented with new onset seizures with fever, suspicious for meningitis. WBC 11.7 and tmax 100.6.   Plan: Rocephin per MD  Start vancomycin IV 1500 mg every 24 hours (per nomogram dosing given meningitis indication) Start ampicillin IV 2 g every 4 hours Start acyclovir IV 995 mg every 8 hours - LR @ 125  Monitor WBC, temperature, renal function, and cultures De-escalate as able   Weight: 132 kg (291 lb 0.1 oz)  Temp (24hrs), Avg:102.1 F (38.9 C), Min:100.6 F (38.1 C), Max:103 F (39.4 C)  Recent Labs  Lab 12/29/22 1645  WBC 11.7*  CREATININE 1.41*    Estimated Creatinine Clearance: 63.6 mL/min (A) (by C-G formula based on SCr of 1.41 mg/dL (H)).    No Known Allergies  Antimicrobials this admission: 8/7 Ampicillin >>  8/7 Ceftriaxone >>  8/7 Vancomycin >>  8/7 Acyclovir   Dose adjustments this admission:   Microbiology results: 8/7 BCx:  8/7 MRSA PCR: negative 8/7 respiratory panel: negative  Thank you for allowing pharmacy to be a part of this patient's care.  Griffin Dakin 12/29/2022 6:59 PM

## 2022-12-29 NOTE — Progress Notes (Signed)
RN called questioning keppra dose to be given per neurology recommendations as it looked like 5 doses were to be given.  Explained that two orders had been placed, one for 2 doses of 1500mg  and one for 3 doses of 1500mg . The original order for 2 doses of 1500 mg had been discontinued, but her screen had not refreshed this.  As such, RN mistakenly charted on old inactive order and in her efforts to correct charting, accidentally charted that four 1500mg  doses had been given.  Patient only received 4500 mg of Keppra IV load.  Delmar Landau, PharmD, BCPS 12/29/2022 8:36 PM ED Clinical Pharmacist -  [phone]

## 2022-12-29 NOTE — Progress Notes (Signed)
eLink Physician-Brief Progress Note Patient Name: Ronald Rice DOB: 01/10/1946 MRN: [id number]   Date of Service  12/29/2022  HPI/Events of Note  77 year old with a history of morbid obesity, send hypertension, dyslipidemia and history of CLL transformed into large B-cell lymphoma and was found to be unresponsive staring into the distance.  Had multiple seizures on round and started on empiric treatment for suspected meningitis.  Patient is hypertensive, tachycardic, tachypneic and febrile on presentation.  Metabolic panel with mild electrolyte disturbances, elevated creatinine, and mild leukocytosis.  Urinalysis unremarkable.  Chest radiograph with right chest port.  eICU Interventions  Anticipating fluoroscopic LP when available  Currently protecting his airway, no obvious seizures.  Ceftriaxone, ampicillin, acyclovir, vancomycin for antimicrobials  Dexamethasone x 1 per neurology  GI prophylaxis held.  DVT prophylaxis held in the setting of likely LP.  Maintain SCDs.     Intervention Category Evaluation Type: New Patient Evaluation    12/29/2022, 7:34 PM

## 2022-12-30 ENCOUNTER — Inpatient Hospital Stay (HOSPITAL_COMMUNITY): Payer: Non-veteran care

## 2022-12-30 DIAGNOSIS — R569 Unspecified convulsions: Secondary | ICD-10-CM

## 2022-12-30 LAB — MENINGITIS/ENCEPHALITIS PANEL (CSF)

## 2022-12-30 LAB — CSF CULTURE W GRAM STAIN: Gram Stain: NONE SEEN

## 2022-12-30 LAB — CSF CELL COUNT WITH DIFFERENTIAL
RBC Count, CSF: 440 /mm3 — ABNORMAL HIGH
RBC Count, CSF: 47 /mm3 — ABNORMAL HIGH
Tube #: 1
Tube #: 4
WBC, CSF: 2 /mm3 (ref 0–5)
WBC, CSF: 2 /mm3 (ref 0–5)

## 2022-12-30 LAB — GLUCOSE, CAPILLARY
Glucose-Capillary: 140 mg/dL — ABNORMAL HIGH (ref 70–99)
Glucose-Capillary: 155 mg/dL — ABNORMAL HIGH (ref 70–99)
Glucose-Capillary: 156 mg/dL — ABNORMAL HIGH (ref 70–99)
Glucose-Capillary: 190 mg/dL — ABNORMAL HIGH (ref 70–99)
Glucose-Capillary: 202 mg/dL — ABNORMAL HIGH (ref 70–99)
Glucose-Capillary: 234 mg/dL — ABNORMAL HIGH (ref 70–99)

## 2022-12-30 LAB — PROTEIN AND GLUCOSE, CSF
Glucose, CSF: 109 mg/dL — ABNORMAL HIGH (ref 40–70)
Total  Protein, CSF: 98 mg/dL — ABNORMAL HIGH (ref 15–45)

## 2022-12-30 LAB — MAGNESIUM: Magnesium: 2 mg/dL (ref 1.7–2.4)

## 2022-12-30 MED ORDER — MAGNESIUM SULFATE 4 GM/100ML IV SOLN
4.0000 g | Freq: Once | INTRAVENOUS | Status: AC
  Administered 2022-12-30: 4 g via INTRAVENOUS
  Filled 2022-12-30: qty 100

## 2022-12-30 MED ORDER — MAGNESIUM SULFATE 2 GM/50ML IV SOLN
2.0000 g | Freq: Once | INTRAVENOUS | Status: AC
  Administered 2022-12-30: 2 g via INTRAVENOUS
  Filled 2022-12-30: qty 50

## 2022-12-30 MED ORDER — ALPRAZOLAM 0.25 MG PO TABS
0.2500 mg | ORAL_TABLET | Freq: Once | ORAL | Status: AC | PRN
  Administered 2023-01-01: 0.25 mg via ORAL
  Filled 2022-12-30 (×2): qty 1

## 2022-12-30 MED ORDER — LIDOCAINE 1 % OPTIME INJ - NO CHARGE
5.0000 mL | Freq: Once | INTRAMUSCULAR | Status: AC
  Administered 2022-12-30: 5 mL via INTRADERMAL
  Filled 2022-12-30: qty 6

## 2022-12-30 MED ORDER — LORAZEPAM 2 MG/ML IJ SOLN: 4.0000 mg | INTRAMUSCULAR | Status: DC | PRN

## 2022-12-30 MED ORDER — VANCOMYCIN HCL IN DEXTROSE 1-5 GM/200ML-% IV SOLN
1000.0000 mg | Freq: Two times a day (BID) | INTRAVENOUS | Status: DC
  Administered 2022-12-30: 1000 mg via INTRAVENOUS
  Filled 2022-12-30 (×2): qty 200

## 2022-12-30 MED ORDER — LEVETIRACETAM IN NACL 500 MG/100ML IV SOLN
500.0000 mg | Freq: Two times a day (BID) | INTRAVENOUS | Status: DC
  Administered 2022-12-30 (×2): 500 mg via INTRAVENOUS
  Filled 2022-12-30 (×2): qty 100

## 2022-12-30 MED ORDER — POTASSIUM CHLORIDE 10 MEQ/100ML IV SOLN
10.0000 meq | INTRAVENOUS | Status: AC
  Administered 2022-12-30 (×4): 10 meq via INTRAVENOUS
  Filled 2022-12-30 (×4): qty 100

## 2022-12-30 MED ORDER — LACTATED RINGERS IV SOLN: INTRAVENOUS | Status: DC

## 2022-12-30 NOTE — Procedures (Signed)
Technically successful fluoro guided LP at L4-L5 level with opening pressure of 12 cm H2O. 11 cc of clear CSF sent to lab for analysis.  No immediate post procedural complication.  Please see imaging section of Epic for full dictation.    Mina Marble, PA-C 12/30/2022, 12:46 PM

## 2022-12-30 NOTE — Progress Notes (Signed)
Brief Neuro Update:  Reviewed CSF studies and not consistent with meningitis, no pleocytosis and meningitis/encephalitis panel is negative. Will discontinue empiric meningitis coverage.  Etiology of his seizures unclear. Will continue Keppra for now but if MRI brain is non revealing, we can discontinue Keppra.  Plan discussed with Elink Dr. Delia Chimes.  Erick Blinks [facility]

## 2022-12-30 NOTE — Progress Notes (Signed)
LTM EEG discontinued - no skin breakdown at unhook. [facility] notified 

## 2022-12-30 NOTE — Progress Notes (Signed)
LTM maint complete - no skin breakdown under:  FP1, FP2   

## 2022-12-30 NOTE — Procedures (Addendum)
Patient Name: Ronald Rice  MRN: [id number]  Epilepsy Attending: Charlsie Quest  Referring Physician/Provider: Marjorie Smolder, NP  Duration: 12/29/2022 1154 to 12/30/2022 1203  Patient history: 77 year old patient with history of diffuse large cell non-Hodgkin's lymphoma, CLL, obesity, diabetes, hypertension, hyperlipidemia, CAD, diverticulosis and sleep apnea presents with first altered mental status and then witnessed seizure activity while en route to the hospital. EEG to evaluate for seizure.  Level of alertness: Awake, asleep  AEDs during EEG study: LEV  Technical aspects: This EEG study was done with scalp electrodes positioned according to the 10-20 International system of electrode placement. Electrical activity was reviewed with band pass filter of 1-70Hz , sensitivity of 7 uV/mm, display speed of 43mm/sec with a 60Hz  notched filter applied as appropriate. EEG data were recorded continuously and digitally stored.  Video monitoring was available and reviewed as appropriate.  Description: The posterior dominant rhythm consists of 7.5 Hz activity of moderate voltage (25-35 uV) seen predominantly in posterior head regions, symmetric and reactive to eye opening and eye closing. Sleep was characterized by vertex waves, sleep spindles (12 to 14 Hz), maximal frontocentral region. EEG showed continuous generalized 5 to 7 Hz theta slowing. Hyperventilation and photic stimulation were not performed.     ABNORMALITY - Continuous slow, generalized  IMPRESSION: This study is suggestive of moderate diffuse encephalopathy, nonspecific etiology. No seizures or epileptiform discharges were seen throughout the recording.   Annabelle Harman

## 2022-12-30 NOTE — Progress Notes (Signed)
Pt. Wears cpap at home at bedtime and requested to wear one. Pt. Placed on and tolerating well at this time.

## 2022-12-30 NOTE — Progress Notes (Signed)
Pharmacy Antibiotic Note  Ronald Rice is a 77 y.o. male admitted on 12/29/2022 with meningitis.  Pharmacy has been consulted for vancomycin, ampicillin, and acyclovir dosing.  77 yo male who presented with new onset seizures with fever, suspicious for meningitis. Will adjust based on MRI and LP results  Plan: Adjust to Vancomycin IV 1000 q12 hours (per nomogram dosing given meningitis indication) for improved CrCl Continue CTX 2g q12h Continue ampicillin IV 2 g every 4 hours Continue acyclovir IV 995 mg every 8 hours - LR @ 125  Monitor WBC, temperature, renal function, and cultures De-escalate as able   Weight: 131.7 kg (290 lb 5.5 oz)  Temp (24hrs), Avg:98.4 F (36.9 C), Min:97.5 F (36.4 C), Max:103 F (39.4 C)  Recent Labs  Lab 12/29/22 1645 12/29/22 1701 12/29/22 1702 12/29/22 2049 12/30/22 0144  WBC 11.7*  --   --   --  15.1*  CREATININE 1.41* 1.40*  --   --  1.12  LATICACIDVEN  --   --  2.4* 2.2* 2.1*    Estimated Creatinine Clearance: 80 mL/min (by C-G formula based on SCr of 1.12 mg/dL).    No Known Allergies  Antimicrobials this admission: 8/7 Ampicillin >>  8/7 Ceftriaxone >>  8/7 Vancomycin >>  8/7 Acyclovir   Dose adjustments this admission:   Microbiology results: 8/7 BCx:  8/7 MRSA PCR: negative 8/7 respiratory panel: negative  Thank you for allowing pharmacy to be a part of this patient's care.  Rutherford Nail, PharmD PGY2 Critical Care Pharmacy Resident 12/30/2022 7:55 AM

## 2022-12-30 NOTE — Progress Notes (Signed)
eLink Physician-Brief Progress Note Patient Name: Ronald Rice DOB: December 28, 1945 MRN: [id number]   Date of Service  12/30/2022  HPI/Events of Note  77 year old patient with history of diffuse large cell non-Hodgkin's lymphoma, CLL, obesity, diabetes but initially presented with acute encephalopathy concerning for meningitis.  Underwent lumbar puncture and studies were inconsistent with meningitis.  Patient is not in shock, no evidence of immediate sepsis.  eICU Interventions  De-escalate off empiric antibiotics     Intervention Category Intermediate Interventions: Communication with other healthcare providers and/or family  Conrad [place] 12/30/2022, 8:20 PM

## 2022-12-30 NOTE — Progress Notes (Signed)
Neurology Progress Note  Brief HPI: 77 year old patient with history of diffuse large cell non-Hodgkin's lymphoma, CLL, obesity, diabetes, hypertension, hyperlipidemia, CAD, diverticulosis and sleep apnea presented with altered mental status and seizure activity.  Yesterday, his wife went to wake him up and found him to be staring off into space and not responding.  She called EMS, and he had several witnessed seizures while en route to the hospital and was given 15 mg of Versed.  He was quite somnolent on arrival to the emergency department and would only open his eyes to noxious stimuli.  He was also febrile at that time with rectal temperature of 103.  CT head was negative for acute abnormality.  Patient was connected to long-term EEG, and no seizures have been seen so far.  Given seizures, altered mental status and fever, he was treated empirically for meningitis with ampicillin, ceftriaxone, vancomycin and acyclovir.  Due to his body habitus, he will need to have lumbar puncture performed under fluoroscopy.  Subjective: Today, patient is awake alert and oriented and reports that he does not remember what happened yesterday.  His vital signs are stable, and he has been afebrile.  He has many questions about his condition and what happened yesterday.  Exam: Vitals:   12/30/22 0700 12/30/22 0800  BP: 123/63 (!) 106/58  Pulse: 75 78  Resp: 19 16  Temp: 97.9 F (36.6 C) 97.9 F (36.6 C)  SpO2: 95% 94%   Gen: In bed, NAD Resp: non-labored breathing, no acute distress Abd: soft, nt  Neuro: Mental Status: Alert and oriented to person, place and time with many questions about situation Cranial Nerves: Pupils equal round and reactive, extraocular movements intact, facial sensation symmetrical, face symmetrical, hearing intact to voice, phonation normal, shoulder shrug symmetrical, tongue midline Motor: Able to move all 4 extremities with good antigravity strength Sensory: Intact to light touch  throughout Gait: Deferred  No headache, neck pain or stiffness noted  Pertinent Labs:    Latest Ref Rng & Units 12/30/2022    1:44 AM 12/29/2022    5:35 PM 12/29/2022    5:01 PM  CBC  WBC 4.0 - 10.5 K/uL 15.1     Hemoglobin 13.0 - 17.0 g/dL 16.1  09.6  04.5   Hematocrit 39.0 - 52.0 % 37.6  40.0  40.0   Platelets 150 - 400 K/uL 167          Latest Ref Rng & Units 12/30/2022    1:44 AM 12/29/2022    5:35 PM 12/29/2022    5:01 PM  BMP  Glucose 70 - 99 mg/dL 409   811   BUN 8 - 23 mg/dL 11   13   Creatinine 9.14 - 1.24 mg/dL 7.82   9.56   Sodium 213 - 145 mmol/L 137  140  139   Potassium 3.5 - 5.1 mmol/L 3.6  3.4  3.4   Chloride 98 - 111 mmol/L 99   98   CO2 22 - 32 mmol/L 24     Calcium 8.9 - 10.3 mg/dL 8.8     Lumbar puncture under fluoroscopy pending  Imaging Reviewed:  CT head: No acute abnormality  MRI brain with and without contrast: Pending  EEG 8/8: Pending  Assessment: 77 year old patient with history of non-Hodgkin's lymphoma, CLL, obesity, diabetes, hypertension, hyperlipidemia, CAD, diverticulosis and sleep apnea presented with altered mental status and seizures.  He was found to be quite febrile and has been treated appropriately for meningitis.  He will require  lumbar puncture under fluoroscopy as well as MRI brain with and without contrast.  Overnight EEG has shown no seizure activity so far.  Will continue treating for meningitis, plan to narrow antibiotic coverage once lumbar puncture results have returned.  Impression: concern for Acute meningitis, in the setting of fever, AMS, new onset seizure activity  Recommendations: -Continue ampicillin, ceftriaxone, vancomycin and acyclovir until lumbar puncture results have returned and coverage can be narrow -Lumbar puncture under fluoroscopy - discontinue LTM EEG. -Will add AEDs if seizure activity seen on EEG -Lorazepam 4 mg IV for further seizure activity -MRI brain with and without contrast  Cortney E Ernestina Columbia ,  MSN, AGACNP-BC [facility] See Amion for schedule and pager information 12/30/2022 9:00 AM    NEUROHOSPITALIST ADDENDUM Performed a face to face diagnostic evaluation.   I have reviewed the contents of history and physical exam as documented by PA/ARNP/Resident and agree with above documentation.  I have discussed and formulated the above plan as documented. Edits to the note have been made as needed.  Impression/ey exam findings/Plan: awake and oriented x 3. No nuchal rigidity. Unclear etiology of his multiple seizures. If the LP and MRI are non revealing, dont see need for prolonged AEDs. LTM EEG so far has been negative. Will discontinue LTM EEG.  Erick Blinks, MD [facility] [phone]   If 7pm to 7am, please call on call as listed on AMION.

## 2022-12-30 NOTE — Progress Notes (Signed)
   12/30/22 2147  BiPAP/CPAP/SIPAP  BiPAP/CPAP/SIPAP Pt Type Adult  BiPAP/CPAP/SIPAP Resmed  Mask Type Full face mask  Mask Size Large  Respiratory Rate 21 breaths/min  EPAP 10 cmH2O  FiO2 (%) 36 %  Flow Rate 4 lpm  Patient Home Equipment No  Auto Titrate No  BiPAP/CPAP /SiPAP Vitals  Pulse Rate 75  Resp (!) 21  SpO2 96 %  Bilateral Breath Sounds Clear;Diminished  MEWS Score/Color  MEWS Score 1  MEWS Score Color Green

## 2022-12-30 NOTE — Progress Notes (Signed)
NAME:  Ronald Rice, MRN:  [id number], DOB:  1945/07/03, LOS: 1 ADMISSION DATE:  12/29/2022, CONSULTATION DATE:  12/29/2022 REFERRING MD:  Alroy Dust - EDP , CHIEF COMPLAINT:  Seizures with fever    History of Present Illness:  Ronald Rice is a 77 y.o. male with a past medical history significant for CLL converted to diffuse large B-cell lymphoma now with recurrence CLL, CAD, diabetes, hypertension, hyperlipidemia, OSA, and dementia who presented to the ED after being found unresponsive by family.  Per report patient had a multiple episodes of seizure-like activity with EMS resulting in 15 mg of IV Versed administered.  Initially presented as a code stroke.  On ED arrival patient was seen and minimally responsive with temperature of 103, tachycardia and hypertensive.  CT head per stroke protocol negative.  Lab work significant for potassium 3.4, glucose 205, creatinine 1.14, lWBC 11.7.  Given concern for ability to protect airway long term PCCM consulted for further management and admission   Pertinent  Medical History  CLL converted to diffuse large B-cell lymphoma now with recurrence CLL, CAD, diabetes, hypertension, hyperlipidemia, OSA, and dementia  Significant Hospital Events: Including procedures, antibiotic start and stop dates in addition to other pertinent events   8/7 presented with altered mental status with seizure-like activity and fever 8/7 EEG > no evidence active seizures  Interim History / Subjective:  4 L/min nasal cannula   Objective   Blood pressure 123/63, pulse 75, temperature 97.9 F (36.6 C), resp. rate 19, weight 131.7 kg, SpO2 95%.        Intake/Output Summary (Last 24 hours) at 12/30/2022 0738 Last data filed at 12/30/2022 0600 Gross per 24 hour  Intake 6344.94 ml  Output 3800 ml  Net 2544.94 ml   Filed Weights   12/29/22 1727 12/29/22 1839 12/30/22 0500  Weight: 133.6 kg 132 kg 131.7 kg    Examination: General: Elderly man comfortable in bed, HEENT:  Oropharynx is clear, no secretions, protecting airway, strong voice Neuro: He is awake, tracks, answers questions appropriately although poor memory of prehospital events.  Oriented x 3.  Moves all extremities.  No evidence of tremor or seizure activity CV: Regular, no murmur PULM: Clear bilaterally GI: Nondistended, positive bowel sounds, nontender Extremities: No edema Skin: No rash   Resolved Hospital Problem list     Assessment & Plan:  New onset seizures concerning for meningitis -Received 15 mg IV Versed in route to ED.  Head CT negative Acute encephalopathy P: -Appreciate neurology assistance -LP under fluoroscopy recommended -Continue Keppra -Dexamethasone given x 1 -Vancomycin, ceftriaxone, ampicillin, acyclovir -Will need an MRI brain -Seizure precautions  Severe sepsis -Patient presented with leukocytosis and Tmax of 103 with acute concern for underlying meningitis P: -Follow culture data -Plan for LP, will consult IR -Antimicrobials as above -Follow lactate  At risk for hypoxic respiratory failure  -Minimally responsive but able to currently protect airway with cough and gag present P: -Following wakefulness, airway protection carefully.  At some risk for intubation -Push pulmonary hygiene -Aspiration precautions -Follow chest x-ray  Acute Kidney Injury, improved -Creatinine 1.41 on admit, creatine 1.12 June 2022 P: -Follow BMP and urine output -Avoid nephrotoxins  Hypokalemia, improved P: -Following BMP -Replete electrolytes if indicated  Type 2 diabetes  P: -Sliding-scale insulin and CBG checks per protocol -Goal CBG 140-180    Best Practice (right click and "Reselect all SmartList Selections" daily)   Diet/type: NPO DVT prophylaxis: SCD GI prophylaxis: PPI Lines: N/A Foley:  N/A Code Status:  full code Last date of multidisciplinary goals of care discussion: Update patient and family daily   Labs   CBC: Recent Labs  Lab  12/29/22 1645 12/29/22 1701 12/29/22 1735 12/30/22 0144  WBC 11.7*  --   --  15.1*  NEUTROABS 10.3*  --   --   --   HGB 14.6 13.6 13.6 13.7  HCT 40.0 40.0 40.0 37.6*  MCV 87.3  --   --  84.3  PLT 165  --   --  167    Basic Metabolic Panel: Recent Labs  Lab 12/29/22 1645 12/29/22 1701 12/29/22 1735 12/30/22 0144  NA 137 139 140 137  K 3.4* 3.4* 3.4* 3.6  CL 99 98  --  99  CO2 26  --   --  24  GLUCOSE 205* 201*  --  235*  BUN 13 13  --  11  CREATININE 1.41* 1.40*  --  1.12  CALCIUM 8.9  --   --  8.8*  MG  --   --   --  1.3*  PHOS  --   --   --  3.6   GFR: Estimated Creatinine Clearance: 80 mL/min (by C-G formula based on SCr of 1.12 mg/dL). Recent Labs  Lab 12/29/22 1645 12/29/22 1702 12/29/22 2049 12/30/22 0144  WBC 11.7*  --   --  15.1*  LATICACIDVEN  --  2.4* 2.2* 2.1*    Liver Function Tests: Recent Labs  Lab 12/29/22 1645  AST 33  ALT 41  ALKPHOS 41  BILITOT 1.2  PROT 6.8  ALBUMIN 4.0   No results for input(s): "LIPASE", "AMYLASE" in the last 168 hours. No results for input(s): "AMMONIA" in the last 168 hours.  ABG    Component Value Date/Time   HCO3 27.2 12/29/2022 1916   TCO2 30 12/29/2022 1735   O2SAT 88.2 12/29/2022 1916     Coagulation Profile: Recent Labs  Lab 12/29/22 1645  INR 1.0    Cardiac Enzymes: No results for input(s): "CKTOTAL", "CKMB", "CKMBINDEX", "TROPONINI" in the last 168 hours.  HbA1C: Hgb A1c MFr Bld  Date/Time Value Ref Range Status  12/29/2022 05:05 PM 6.9 (H) 4.8 - 5.6 % Final    Comment:    (NOTE) Pre diabetes:          5.7%-6.4%  Diabetes:              >6.4%  Glycemic control for   <7.0% adults with diabetes   09/17/2020 02:40 PM 6.4 (H) 4.8 - 5.6 % Final    Comment:    (NOTE) Pre diabetes:          5.7%-6.4%  Diabetes:              >6.4%  Glycemic control for   <7.0% adults with diabetes     CBG: Recent Labs  Lab 12/29/22 1641 12/29/22 1836 12/30/22 0002 12/30/22 0343  GLUCAP 180*  186* 234* 202*    Home Medications  Prior to Admission medications   Medication Sig Start Date End Date Taking? Authorizing Provider  acetaminophen (TYLENOL) 500 MG tablet Take 1,000 mg by mouth every 6 (six) hours as needed (pain).     [provider]  allopurinol (ZYLOPRIM) 300 MG tablet Take 300 mg by mouth daily.    [provider]  aspirin EC 81 MG tablet Take 81 mg by mouth daily.    [provider]  atenolol (TENORMIN) 25 MG tablet Take 1 tablet (25 mg total) by mouth  daily. 02/22/22   Merri Brunette, MD  atorvastatin (LIPITOR) 40 MG tablet Take 1 tablet (40 mg total) by mouth daily. 03/12/22   Merri Brunette, MD  chlorthalidone (HYGROTON) 25 MG tablet Take 1 tablet (25 mg total) by mouth in the morning with food. 10/04/22     Cholecalciferol (VITAMIN D3 PO) Take 1 tablet by mouth daily.    [provider]  CINNAMON PO Take 1 tablet by mouth daily.    [provider]  diclofenac sodium (VOLTAREN) 1 % GEL Apply 1 application topically as needed (pain).  Patient not taking: Reported on 10/05/2022 10/10/18   [provider]  donepezil (ARICEPT ODT) 5 MG disintegrating tablet Take 5 mg by mouth at bedtime. 03/29/19   [provider]  donepezil (ARICEPT) 5 MG tablet Take 1 tablet by mouth once daily. 12/27/22     famotidine (PEPCID) 20 MG tablet Take 20 mg by mouth at bedtime as needed for heartburn. Patient not taking: Reported on 10/05/2022    [provider]  ipratropium (ATROVENT) 0.06 % nasal spray Place 1 spray into both nostrils daily as needed for rhinitis. Patient not taking: Reported on 10/05/2022 08/29/19   [provider]  Lancets [facility] [place]ose [place] PLUS [place]) MISC  09/27/19   [provider]  metFORMIN (GLUCOPHAGE-XR) 500 MG 24 hr tablet Take 2 tablets (1,000 mg total) by mouth 2 (two) times daily with food. 10/27/22     potassium chloride (KLOR-CON M) 10 MEQ tablet Take 1 tablet (10 mEq total) by mouth  daily. 03/30/22   Merri Brunette, MD  pregabalin (LYRICA) 75 MG capsule Take 2 capsules (150 mg total) by mouth 2 (two) times daily. 12/22/22     pyridoxine (B-6) 250 MG tablet Take 250 mg by mouth daily.    [provider]  tadalafil (CIALIS) 10 MG tablet Take 1 tablet (10 mg total) by mouth daily as needed. 02/09/22     tamsulosin (FLOMAX) 0.4 MG CAPS capsule Take 1 capsule (0.4 mg total) by mouth daily. 10/04/22     tirzepatide (MOUNJARO) 2.5 MG/0.5ML Pen Inject 2.5 mg into the skin every 7 (seven) days. 11/24/22     traMADol (ULTRAM) 50 MG tablet Take 1 tablet (50 mg total) by mouth 4 (four) times daily as needed. 10/26/22     DULoxetine (CYMBALTA) 60 MG capsule Take 1 capsule (60 mg total) by mouth daily. 01/12/22 06/18/22  Josph Macho, MD     Critical care time:  CRITICAL CARE Performed by: Leslye Peer  Total critical care time: 32 minutes  Critical care time was exclusive of separately billable procedures and treating other patients.  Critical care was necessary to treat or prevent imminent or life-threatening deterioration.  Critical care was time spent personally by me on the following activities: development of treatment plan with patient and/or surrogate as well as nursing, discussions with consultants, evaluation of patient's response to treatment, examination of patient, obtaining history from patient or surrogate, ordering and performing treatments and interventions, ordering and review of laboratory studies, ordering and review of radiographic studies, pulse oximetry and re-evaluation of patient's condition.  Levy Pupa, MD, PhD 12/30/2022, 7:38 AM [facility] [phone] or if no answer before 7:00PM call [phone] For any issues after 7:00PM please call eLink [phone]

## 2022-12-30 NOTE — Procedures (Signed)
Patient Name: Ronald Rice  MRN: [id number]  Epilepsy Attending: Charlsie Quest  Referring Physician/Provider: Marjorie Smolder, NP  Duration: 12/30/2022 1253 to 12/30/2022 1453   Patient history: 77 year old patient with history of diffuse large cell non-Hodgkin's lymphoma, CLL, obesity, diabetes, hypertension, hyperlipidemia, CAD, diverticulosis and sleep apnea presents with first altered mental status and then witnessed seizure activity while en route to the hospital. EEG to evaluate for seizure.   Level of alertness: Awake, asleep   AEDs during EEG study: LEV   Technical aspects: This EEG study was done with scalp electrodes positioned according to the 10-20 International system of electrode placement. Electrical activity was reviewed with band pass filter of 1-70Hz , sensitivity of 7 uV/mm, display speed of 55mm/sec with a 60Hz  notched filter applied as appropriate. EEG data were recorded continuously and digitally stored.  Video monitoring was available and reviewed as appropriate.   Description: The posterior dominant rhythm consists of 7.5 Hz activity of moderate voltage (25-35 uV) seen predominantly in posterior head regions, symmetric and reactive to eye opening and eye closing. Sleep was characterized by vertex waves, sleep spindles (12 to 14 Hz), maximal frontocentral region. EEG showed continuous generalized 5 to 7 Hz theta slowing. Hyperventilation and photic stimulation were not performed.      ABNORMALITY - Continuous slow, generalized   IMPRESSION: This study is suggestive of moderate diffuse encephalopathy, nonspecific etiology. No seizures or epileptiform discharges were seen throughout the recording.    Annabelle Harman

## 2022-12-30 NOTE — Progress Notes (Signed)
[facility] ADULT ICU REPLACEMENT PROTOCOL   The patient does apply for the [facility] Adult ICU Electrolyte Replacment Protocol based on the criteria listed below:   1.Exclusion criteria: TCTS, ECMO, Dialysis, and Myasthenia Gravis patients 2. Is GFR >/= 30 ml/min? Yes.    Patient's GFR today is >60 3. Is SCr </= 2? Yes.   Patient's SCr is 1.12 mg/dL 4. Did SCr increase >/= 0.5 in 24 hours? No. 5.Pt's weight >40kg  Yes.   6. Abnormal electrolyte(s): potassium 3.6, mag 1.3  7. Electrolytes replaced per protocol 8.  Call MD STAT for K+ </= 2.5, Phos </= 1, or Mag </= 1 Physician:  protocol  Melvern Banker 12/30/2022 3:52 AM

## 2022-12-31 DIAGNOSIS — R569 Unspecified convulsions: Secondary | ICD-10-CM | POA: Diagnosis not present

## 2022-12-31 DIAGNOSIS — R509 Fever, unspecified: Secondary | ICD-10-CM

## 2022-12-31 LAB — CBC
HCT: 32.6 % — ABNORMAL LOW (ref 39.0–52.0)
Hemoglobin: 11.9 g/dL — ABNORMAL LOW (ref 13.0–17.0)
MCH: 30.7 pg (ref 26.0–34.0)
MCHC: 36.5 g/dL — ABNORMAL HIGH (ref 30.0–36.0)
MCV: 84 fL (ref 80.0–100.0)
Platelets: 142 10*3/uL — ABNORMAL LOW (ref 150–400)
RBC: 3.88 MIL/uL — ABNORMAL LOW (ref 4.22–5.81)
RDW: 13.2 % (ref 11.5–15.5)
WBC: 10.3 10*3/uL (ref 4.0–10.5)
nRBC: 0 % (ref 0.0–0.2)

## 2022-12-31 LAB — GLUCOSE, CAPILLARY
Glucose-Capillary: 125 mg/dL — ABNORMAL HIGH (ref 70–99)
Glucose-Capillary: 127 mg/dL — ABNORMAL HIGH (ref 70–99)
Glucose-Capillary: 136 mg/dL — ABNORMAL HIGH (ref 70–99)
Glucose-Capillary: 168 mg/dL — ABNORMAL HIGH (ref 70–99)
Glucose-Capillary: 171 mg/dL — ABNORMAL HIGH (ref 70–99)
Glucose-Capillary: 187 mg/dL — ABNORMAL HIGH (ref 70–99)

## 2022-12-31 MED ORDER — PREGABALIN 50 MG PO CAPS
150.0000 mg | ORAL_CAPSULE | Freq: Two times a day (BID) | ORAL | Status: DC
  Administered 2022-12-31 – 2023-01-03 (×7): 150 mg via ORAL
  Filled 2022-12-31 (×3): qty 3
  Filled 2022-12-31: qty 6
  Filled 2022-12-31 (×3): qty 3

## 2022-12-31 MED ORDER — POTASSIUM CHLORIDE CRYS ER 20 MEQ PO TBCR
40.0000 meq | EXTENDED_RELEASE_TABLET | Freq: Once | ORAL | Status: AC
  Administered 2022-12-31: 40 meq via ORAL
  Filled 2022-12-31: qty 2

## 2022-12-31 MED ORDER — LEVETIRACETAM 500 MG PO TABS
500.0000 mg | ORAL_TABLET | Freq: Two times a day (BID) | ORAL | Status: DC
  Administered 2022-12-31 – 2023-01-03 (×7): 500 mg via ORAL
  Filled 2022-12-31 (×7): qty 1

## 2022-12-31 MED ORDER — ATENOLOL 25 MG PO TABS
25.0000 mg | ORAL_TABLET | Freq: Every day | ORAL | Status: DC
  Administered 2022-12-31 – 2023-01-03 (×4): 25 mg via ORAL
  Filled 2022-12-31 (×4): qty 1

## 2022-12-31 MED ORDER — ORAL CARE MOUTH RINSE: 15.0000 mL | OROMUCOSAL | Status: DC | PRN

## 2022-12-31 MED ORDER — ENOXAPARIN SODIUM 40 MG/0.4ML IJ SOSY
40.0000 mg | PREFILLED_SYRINGE | INTRAMUSCULAR | Status: DC
  Administered 2022-12-31 – 2023-01-03 (×4): 40 mg via SUBCUTANEOUS
  Filled 2022-12-31 (×4): qty 0.4

## 2022-12-31 MED ORDER — POTASSIUM CHLORIDE CRYS ER 10 MEQ PO TBCR
10.0000 meq | EXTENDED_RELEASE_TABLET | Freq: Every day | ORAL | Status: DC
  Administered 2023-01-01: 10 meq via ORAL
  Filled 2022-12-31: qty 1

## 2022-12-31 MED ORDER — ASPIRIN 81 MG PO TBEC
81.0000 mg | DELAYED_RELEASE_TABLET | Freq: Every day | ORAL | Status: DC
  Administered 2022-12-31 – 2023-01-03 (×4): 81 mg via ORAL
  Filled 2022-12-31 (×4): qty 1

## 2022-12-31 MED ORDER — INSULIN ASPART 100 UNIT/ML IJ SOLN
0.0000 [IU] | Freq: Three times a day (TID) | INTRAMUSCULAR | Status: DC
  Administered 2022-12-31 (×2): 4 [IU] via SUBCUTANEOUS
  Administered 2023-01-01: 3 [IU] via SUBCUTANEOUS
  Administered 2023-01-01 – 2023-01-03 (×7): 4 [IU] via SUBCUTANEOUS

## 2022-12-31 MED ORDER — ATORVASTATIN CALCIUM 40 MG PO TABS
40.0000 mg | ORAL_TABLET | Freq: Every day | ORAL | Status: DC
  Administered 2022-12-31 – 2023-01-03 (×4): 40 mg via ORAL
  Filled 2022-12-31 (×4): qty 1

## 2022-12-31 MED ORDER — ORAL CARE MOUTH RINSE
15.0000 mL | OROMUCOSAL | Status: DC
  Administered 2022-12-31 – 2023-01-03 (×13): 15 mL via OROMUCOSAL

## 2022-12-31 MED ORDER — MAGNESIUM SULFATE 2 GM/50ML IV SOLN
2.0000 g | Freq: Once | INTRAVENOUS | Status: AC
  Administered 2022-12-31: 2 g via INTRAVENOUS
  Filled 2022-12-31: qty 50

## 2022-12-31 MED ORDER — ALLOPURINOL 100 MG PO TABS
300.0000 mg | ORAL_TABLET | Freq: Every day | ORAL | Status: DC
  Administered 2022-12-31 – 2023-01-03 (×4): 300 mg via ORAL
  Filled 2022-12-31 (×2): qty 3
  Filled 2022-12-31: qty 1
  Filled 2022-12-31: qty 3

## 2022-12-31 MED ORDER — INSULIN ASPART 100 UNIT/ML IJ SOLN: 0.0000 [IU] | Freq: Every day | INTRAMUSCULAR | Status: DC

## 2022-12-31 MED ORDER — CHLORTHALIDONE 25 MG PO TABS
25.0000 mg | ORAL_TABLET | Freq: Every morning | ORAL | Status: DC
  Administered 2022-12-31 – 2023-01-01 (×2): 25 mg via ORAL
  Filled 2022-12-31 (×3): qty 1

## 2022-12-31 NOTE — Progress Notes (Signed)
NAME:  Ronald Rice, MRN:  [id number], DOB:  04/19/1946, LOS: 2 ADMISSION DATE:  12/29/2022, CONSULTATION DATE:  12/29/2022 REFERRING MD:  Alroy Dust - EDP , CHIEF COMPLAINT:  Seizures with fever    History of Present Illness:  Ronald Rice is a 77 y.o. male with a past medical history significant for CLL converted to diffuse large B-cell lymphoma now with recurrence CLL, CAD, diabetes, hypertension, hyperlipidemia, OSA, and dementia who presented to the ED after being found unresponsive by family.  Per report patient had a multiple episodes of seizure-like activity with EMS resulting in 15 mg of IV Versed administered.  Initially presented as a code stroke.  On ED arrival patient was seen and minimally responsive with temperature of 103, tachycardia and hypertensive.  CT head per stroke protocol negative.  Lab work significant for potassium 3.4, glucose 205, creatinine 1.14, lWBC 11.7.  Given concern for ability to protect airway long term PCCM consulted for further management and admission   Pertinent  Medical History  CLL converted to diffuse large B-cell lymphoma now with recurrence CLL, CAD, diabetes, hypertension, hyperlipidemia, OSA, and dementia  Significant Hospital Events: Including procedures, antibiotic start and stop dates in addition to other pertinent events   8/7 presented with altered mental status with seizure-like activity and fever 8/7 EEG > no evidence active seizures 8/8 LP by IR >> negative for infection >> ABx d/c'ed 8/9 transfer to medical tele  Interim History / Subjective:  Tolerating diet.  Denies headache, chest pain, dyspnea, nausea.  He uses a walker for ambulation.  Objective   Blood pressure 120/81, pulse 77, temperature 97.7 F (36.5 C), temperature source Axillary, resp. rate 19, weight (!) 136.7 kg, SpO2 91%.    FiO2 (%):  [36 %] 36 %   Intake/Output Summary (Last 24 hours) at 12/31/2022 0905 Last data filed at 12/31/2022 0800 Gross per 24 hour  Intake 2187.93  ml  Output 1925 ml  Net 262.93 ml   Filed Weights   12/29/22 1839 12/30/22 0500 12/31/22 0500  Weight: 132 kg 131.7 kg (!) 136.7 kg    Examination:  General - alert Eyes - pupils reactive ENT - no sinus tenderness, no stridor Cardiac - regular rate/rhythm, no murmur Chest - equal breath sounds b/l, no wheezing or rales Abdomen - soft, non tender, + bowel sounds Extremities - no cyanosis, clubbing, or edema Skin - no rashes Neuro - normal strength, moves extremities, follows commands Psych - normal mood and behavior  Resolved Hospital Problem list   AKI from sepsis  Assessment & Plan:   Seizures, Fever with acute metabolic encephalopathy from infection. - LP findings negative for meningitis - MRI brain pending >> he has spinal stimulator; need to figure out if this is MRI compatible  - neurology following - continue keppra  Fever, leukocytosis with concern for severe sepsis. - unclear source - off ABx now - f/u blood cultures from 8/07, CSF cultures from 8/08  Hypokalemia. - f/u BMET  DM type 2. - SSI - hold outpt metformin, mounjaro for now  Hx of CLL with transformation to large cell Non-Hodgkin's lymphoma. - followed by Dr. Arlan Organ with oncology  Hx of HTN, HLD. - ASA, atenolol, lipitor, chlorthalidone  Peripheral neuropathy. - lyrica  Obstructive sleep apnea. - CPAP while asleep  Hx of gout. - allopurinol  Transfer to medical telemetry on 8/09.  To [facility] 8/10 and PCCM off.  Best Practice (right click and "Reselect all SmartList Selections" daily)   Diet/type: Regular  consistency (see orders) DVT prophylaxis: LMWH GI prophylaxis: N/A Lines: yes and it is still needed; has a port Foley:  N/A Code Status:  full code Last date of multidisciplinary goals of care discussion: x  Labs       Latest Ref Rng & Units 12/31/2022    1:40 AM 12/30/2022    1:44 AM 12/29/2022    5:35 PM  CMP  Glucose 70 - 99 mg/dL 086  578    BUN 8 - 23 mg/dL 13  11     Creatinine 4.69 - 1.24 mg/dL 6.29  5.28    Sodium 413 - 145 mmol/L 139  137  140   Potassium 3.5 - 5.1 mmol/L 3.3  3.6  3.4   Chloride 98 - 111 mmol/L 102  99    CO2 22 - 32 mmol/L 27  24    Calcium 8.9 - 10.3 mg/dL 8.5  8.8        Latest Ref Rng & Units 12/31/2022    4:32 AM 12/30/2022    1:44 AM 12/29/2022    5:35 PM  CBC  WBC 4.0 - 10.5 K/uL 10.3  15.1    Hemoglobin 13.0 - 17.0 g/dL 24.4  01.0  27.2   Hematocrit 39.0 - 52.0 % 32.6  37.6  40.0   Platelets 150 - 400 K/uL 142  167     CBG (last 3)  Recent Labs    12/31/22 0001 12/31/22 0353 12/31/22 0801  GLUCAP 136* 127* 125*    Signature:  Coralyn Helling, MD [facility] Pager - [phone] or [phone] 12/31/2022, 9:05 AM

## 2022-12-31 NOTE — Plan of Care (Signed)
  Problem: Education: Goal: Ability to describe self-care measures that may prevent or decrease complications (Diabetes Survival Skills Education) will improve Outcome: Progressing Goal: Individualized Educational Video(s) Outcome: Progressing   Problem: Coping: Goal: Ability to adjust to condition or change in health will improve Outcome: Progressing   Problem: Fluid Volume: Goal: Ability to maintain a balanced intake and output will improve Outcome: Progressing   Problem: Health Behavior/Discharge Planning: Goal: Ability to identify and utilize available resources and services will improve Outcome: Progressing Goal: Ability to manage health-related needs will improve Outcome: Progressing   Problem: Metabolic: Goal: Ability to maintain appropriate glucose levels will improve Outcome: Progressing   Problem: Nutritional: Goal: Maintenance of adequate nutrition will improve Outcome: Progressing Goal: Progress toward achieving an optimal weight will improve Outcome: Progressing   Problem: Skin Integrity: Goal: Risk for impaired skin integrity will decrease Outcome: Progressing   Problem: Tissue Perfusion: Goal: Adequacy of tissue perfusion will improve Outcome: Progressing   Problem: Education: Goal: Knowledge of General Education information will improve Description: Including pain rating scale, medication(s)/side effects and non-pharmacologic comfort measures Outcome: Progressing   Problem: Health Behavior/Discharge Planning: Goal: Ability to manage health-related needs will improve Outcome: Progressing   Problem: Clinical Measurements: Goal: Ability to maintain clinical measurements within normal limits will improve Outcome: Progressing Goal: Will remain free from infection Outcome: Progressing Goal: Diagnostic test results will improve Outcome: Progressing Goal: Respiratory complications will improve Outcome: Progressing Goal: Cardiovascular complication will  be avoided Outcome: Progressing   Problem: Activity: Goal: Risk for activity intolerance will decrease Outcome: Progressing   Problem: Nutrition: Goal: Adequate nutrition will be maintained Outcome: Progressing   Problem: Coping: Goal: Level of anxiety will decrease Outcome: Progressing   Problem: Elimination: Goal: Will not experience complications related to bowel motility Outcome: Progressing Goal: Will not experience complications related to urinary retention Outcome: Progressing   Problem: Pain Managment: Goal: General experience of comfort will improve Outcome: Progressing   Problem: Safety: Goal: Ability to remain free from injury will improve Outcome: Progressing   Problem: Skin Integrity: Goal: Risk for impaired skin integrity will decrease Outcome: Progressing   Problem: Education: Goal: Expressions of having a comfortable level of knowledge regarding the disease process will increase Outcome: Progressing   Problem: Coping: Goal: Ability to adjust to condition or change in health will improve Outcome: Progressing Goal: Ability to identify appropriate support needs will improve Outcome: Progressing   Problem: Health Behavior/Discharge Planning: Goal: Compliance with prescribed medication regimen will improve Outcome: Progressing   Problem: Medication: Goal: Risk for medication side effects will decrease Outcome: Progressing   Problem: Clinical Measurements: Goal: Complications related to the disease process, condition or treatment will be avoided or minimized Outcome: Progressing Goal: Diagnostic test results will improve Outcome: Progressing   Problem: Safety: Goal: Verbalization of understanding the information provided will improve Outcome: Progressing   Problem: Self-Concept: Goal: Level of anxiety will decrease Outcome: Progressing Goal: Ability to verbalize feelings about condition will improve Outcome: Progressing   

## 2022-12-31 NOTE — Progress Notes (Signed)
[facility] ADULT ICU REPLACEMENT PROTOCOL   The patient does apply for the [facility] Adult ICU Electrolyte Replacment Protocol based on the criteria listed below:   1.Exclusion criteria: TCTS, ECMO, Dialysis, and Myasthenia Gravis patients 2. Is GFR >/= 30 ml/min? Yes.    Patient's GFR today is 52 3. Is SCr </= 2? Yes.   Patient's SCr is 1.40 mg/dL 4. Did SCr increase >/= 0.5 in 24 hours? No. 5.Pt's weight >40kg  Yes.   6. Abnormal electrolyte(s): potassium 3.3, mag 1.9  7. Electrolytes replaced per protocol 8.  Call MD STAT for K+ </= 2.5, Phos </= 1, or Mag </= 1 Physician:  protocol  Melvern Banker 12/31/2022 3:06 AM

## 2023-01-01 ENCOUNTER — Inpatient Hospital Stay (HOSPITAL_COMMUNITY): Payer: Non-veteran care

## 2023-01-01 DIAGNOSIS — G9341 Metabolic encephalopathy: Secondary | ICD-10-CM | POA: Diagnosis not present

## 2023-01-01 DIAGNOSIS — R569 Unspecified convulsions: Secondary | ICD-10-CM | POA: Diagnosis not present

## 2023-01-01 DIAGNOSIS — E876 Hypokalemia: Secondary | ICD-10-CM | POA: Diagnosis not present

## 2023-01-01 LAB — GLUCOSE, CAPILLARY
Glucose-Capillary: 140 mg/dL — ABNORMAL HIGH (ref 70–99)
Glucose-Capillary: 197 mg/dL — ABNORMAL HIGH (ref 70–99)
Glucose-Capillary: 168 mg/dL — ABNORMAL HIGH (ref 70–99)
Glucose-Capillary: 172 mg/dL — ABNORMAL HIGH (ref 70–99)

## 2023-01-01 LAB — MAGNESIUM: Magnesium: 1.7 mg/dL (ref 1.7–2.4)

## 2023-01-01 MED ORDER — AMLODIPINE BESYLATE 10 MG PO TABS
10.0000 mg | ORAL_TABLET | Freq: Every day | ORAL | Status: DC
  Administered 2023-01-01 – 2023-01-03 (×3): 10 mg via ORAL
  Filled 2023-01-01 (×3): qty 1

## 2023-01-01 MED ORDER — GADOBUTROL 1 MMOL/ML IV SOLN
10.0000 mL | Freq: Once | INTRAVENOUS | Status: AC | PRN
  Administered 2023-01-01: 10 mL via INTRAVENOUS

## 2023-01-01 MED ORDER — INSULIN GLARGINE-YFGN 100 UNIT/ML ~~LOC~~ SOLN
10.0000 [IU] | Freq: Every day | SUBCUTANEOUS | Status: DC
  Administered 2023-01-01 – 2023-01-02 (×2): 10 [IU] via SUBCUTANEOUS
  Filled 2023-01-01 (×3): qty 0.1

## 2023-01-01 MED ORDER — POTASSIUM CHLORIDE CRYS ER 20 MEQ PO TBCR
40.0000 meq | EXTENDED_RELEASE_TABLET | ORAL | Status: AC
  Administered 2023-01-01 (×2): 40 meq via ORAL
  Filled 2023-01-01 (×2): qty 2

## 2023-01-01 MED ORDER — POTASSIUM CHLORIDE 10 MEQ/100ML IV SOLN
10.0000 meq | INTRAVENOUS | Status: AC
  Administered 2023-01-01 (×3): 10 meq via INTRAVENOUS
  Filled 2023-01-01 (×3): qty 100

## 2023-01-01 MED ORDER — LORAZEPAM 1 MG PO TABS: 1.0000 mg | ORAL_TABLET | ORAL | Status: DC | PRN

## 2023-01-01 MED ORDER — ACETAMINOPHEN 325 MG PO TABS
650.0000 mg | ORAL_TABLET | Freq: Four times a day (QID) | ORAL | Status: DC | PRN
  Administered 2023-01-01: 650 mg via ORAL
  Filled 2023-01-01: qty 2

## 2023-01-01 NOTE — Plan of Care (Signed)
  Problem: Education: Goal: Ability to describe self-care measures that may prevent or decrease complications (Diabetes Survival Skills Education) will improve Outcome: Progressing Goal: Individualized Educational Video(s) Outcome: Progressing   Problem: Coping: Goal: Ability to adjust to condition or change in health will improve Outcome: Progressing   Problem: Fluid Volume: Goal: Ability to maintain a balanced intake and output will improve Outcome: Progressing   Problem: Health Behavior/Discharge Planning: Goal: Ability to identify and utilize available resources and services will improve Outcome: Progressing Goal: Ability to manage health-related needs will improve Outcome: Progressing   Problem: Metabolic: Goal: Ability to maintain appropriate glucose levels will improve Outcome: Progressing   Problem: Nutritional: Goal: Maintenance of adequate nutrition will improve Outcome: Progressing Goal: Progress toward achieving an optimal weight will improve Outcome: Progressing   Problem: Skin Integrity: Goal: Risk for impaired skin integrity will decrease Outcome: Progressing   Problem: Tissue Perfusion: Goal: Adequacy of tissue perfusion will improve Outcome: Progressing   Problem: Education: Goal: Knowledge of General Education information will improve Description: Including pain rating scale, medication(s)/side effects and non-pharmacologic comfort measures Outcome: Progressing   Problem: Health Behavior/Discharge Planning: Goal: Ability to manage health-related needs will improve Outcome: Progressing   Problem: Clinical Measurements: Goal: Ability to maintain clinical measurements within normal limits will improve Outcome: Progressing Goal: Will remain free from infection Outcome: Progressing Goal: Diagnostic test results will improve Outcome: Progressing Goal: Respiratory complications will improve Outcome: Progressing Goal: Cardiovascular complication will  be avoided Outcome: Progressing   Problem: Activity: Goal: Risk for activity intolerance will decrease Outcome: Progressing   Problem: Nutrition: Goal: Adequate nutrition will be maintained Outcome: Progressing   Problem: Coping: Goal: Level of anxiety will decrease Outcome: Progressing   Problem: Elimination: Goal: Will not experience complications related to bowel motility Outcome: Progressing Goal: Will not experience complications related to urinary retention Outcome: Progressing   Problem: Pain Managment: Goal: General experience of comfort will improve Outcome: Progressing   Problem: Safety: Goal: Ability to remain free from injury will improve Outcome: Progressing   Problem: Skin Integrity: Goal: Risk for impaired skin integrity will decrease Outcome: Progressing   Problem: Education: Goal: Expressions of having a comfortable level of knowledge regarding the disease process will increase Outcome: Progressing   Problem: Coping: Goal: Ability to adjust to condition or change in health will improve Outcome: Progressing Goal: Ability to identify appropriate support needs will improve Outcome: Progressing   Problem: Health Behavior/Discharge Planning: Goal: Compliance with prescribed medication regimen will improve Outcome: Progressing   Problem: Medication: Goal: Risk for medication side effects will decrease Outcome: Progressing   Problem: Clinical Measurements: Goal: Complications related to the disease process, condition or treatment will be avoided or minimized Outcome: Progressing Goal: Diagnostic test results will improve Outcome: Progressing   Problem: Safety: Goal: Verbalization of understanding the information provided will improve Outcome: Progressing   Problem: Self-Concept: Goal: Level of anxiety will decrease Outcome: Progressing Goal: Ability to verbalize feelings about condition will improve Outcome: Progressing   

## 2023-01-01 NOTE — Evaluation (Signed)
Physical Therapy Evaluation  Patient Details Name: Ronald Rice MRN: [id number] DOB: July 18, 1945 Today's Date: 01/01/2023  History of Present Illness  77 y.o. male admitted 8/7 with AMS, unresponsive and sz. 8/8 EEG with encephalopathy and LP negative for infection. PMhx: dementia, diffuse large cell non-Hodgkin's lymphoma, CLL, obesity, DM, HTN, HLD, CAD, diverticulosis and sleep apnea  Clinical Impression  Pt admitted with above diagnosis. Pt currently with functional limitations due to the deficits listed below (see PT Problem List). At the time of PT eval pt was able to perform transfers and ambulation with up to +2 mod assist and chair follow for safety. Pt reports feeling like his arms are very weak with propping on the RW. He believes he will do better with his rollator, however I am hesitant to attempt because I feel he needs the increased stability of the RW at this time. Pt does not appear to be at his baseline of function. Pt will benefit from acute skilled PT to increase their independence and safety with mobility to allow discharge.           If plan is discharge home, recommend the following: A little help with walking and/or transfers;A little help with bathing/dressing/bathroom;Assistance with cooking/housework;Direct supervision/assist for medications management;Direct supervision/assist for financial management;Help with stairs or ramp for entrance;Assist for transportation;Supervision due to cognitive status   Can travel by private vehicle   No    Equipment Recommendations Rolling walker (2 wheels)  Recommendations for Other Services       Functional Status Assessment Patient has had a recent decline in their functional status and demonstrates the ability to make significant improvements in function in a reasonable and predictable amount of time.     Precautions / Restrictions Precautions Precautions: Fall Precaution Comments: seizure Restrictions Weight Bearing  Restrictions: No      Mobility  Bed Mobility Overal bed mobility: Needs Assistance Bed Mobility: Supine to Sit     Supine to sit: Min assist     General bed mobility comments: Increased time and effort. Assist for trunk elevation to full sitting position.    Transfers Overall transfer level: Needs assistance Equipment used: Rolling walker (2 wheels) Transfers: Sit to/from Stand Sit to Stand: Mod assist, +2 physical assistance, +2 safety/equipment           General transfer comment: +2 min progressing to +2 mod by end of session due to fatigue.    Ambulation/Gait Ambulation/Gait assistance: Min assist, +2 safety/equipment Gait Distance (Feet): 12 Feet Assistive device: Rolling walker (2 wheels) Gait Pattern/deviations: Step-through pattern, Decreased stride length, Trunk flexed, Wide base of support Gait velocity: Decreased Gait velocity interpretation: 1.31 - 2.62 ft/sec, indicative of limited community ambulator   General Gait Details: Heavy reliance on RW. +2 for safety and chair follow. Pt fatigued quickly and required seated rest break after 12' initially, then another seated rest break after another 6' before finally step-pivoting around to the recliner which was brought up close to him.  Stairs            Wheelchair Mobility     Tilt Bed    Modified Rankin (Stroke Patients Only) Modified Rankin (Stroke Patients Only) Pre-Morbid Rankin Score: Moderate disability Modified Rankin: Moderately severe disability     Balance Overall balance assessment: Needs assistance Sitting-balance support: Feet supported Sitting balance-Leahy Scale: Normal     Standing balance support: Bilateral upper extremity supported, During functional activity Standing balance-Leahy Scale: Poor  Pertinent Vitals/Pain Pain Assessment Pain Assessment: Faces Faces Pain Scale: Hurts little more Pain Location: LLE and R shoulder Pain  Descriptors / Indicators: Discomfort    Home Living Family/patient expects to be discharged to:: Private residence Living Arrangements: Spouse/significant other Available Help at Discharge: Family Type of Home: House Home Access: Stairs to enter;Ramped entrance       Home Layout: One level Home Equipment: Shower seat - built in;Grab bars - tub/shower;Rollator (4 wheels)      Prior Function Prior Level of Function : Independent/Modified Independent             Mobility Comments: Initally states he cant really walk, then reports he can walk community distances with rollator. denies falls ADLs Comments: reports he is mod I with ADLs, wife assists minimally if needed. Does not drive, does not put on his shoes/socks without help     Extremity/Trunk Assessment   Upper Extremity Assessment Upper Extremity Assessment: Defer to OT evaluation    Lower Extremity Assessment Lower Extremity Assessment: LLE deficits/detail LLE Deficits / Details: Pt reports he broke his LLE last year and states it is painful. Noted edema in lower leg and foot.    Cervical / Trunk Assessment Cervical / Trunk Assessment: Kyphotic (mild; forward head posture with rounded shoulders)  Communication   Communication Communication: No apparent difficulties  Cognition Arousal: Alert Behavior During Therapy: Flat affect Overall Cognitive Status: History of cognitive impairments - at baseline Area of Impairment: Orientation, Safety/judgement, Awareness, Problem solving                 Orientation Level: Disoriented to, Situation       Safety/Judgement: Decreased awareness of safety, Decreased awareness of deficits Awareness: Emergent Problem Solving: Slow processing, Requires verbal cues, Difficulty sequencing General Comments: Oriented except to situation. Pt stated, "I cannot remember" several times throguhout the session. Poor safety and deficit awareness, needed cues for self monitoring         General Comments General comments (skin integrity, edema, etc.): VSS    Exercises     Assessment/Plan    PT Assessment Patient needs continued PT services  PT Problem List Decreased strength;Decreased activity tolerance;Decreased balance;Decreased mobility;Decreased cognition;Decreased knowledge of use of DME;Decreased safety awareness;Decreased knowledge of precautions;Pain       PT Treatment Interventions DME instruction;Gait training;Stair training;Functional mobility training;Therapeutic activities;Therapeutic exercise;Balance training;Patient/family education;Cognitive remediation    PT Goals (Current goals can be found in the Care Plan section)  Acute Rehab PT Goals Patient Stated Goal: Be able to return home PT Goal Formulation: With patient Time For Goal Achievement: 01/15/23 Potential to Achieve Goals: Good    Frequency Min 1X/week     Co-evaluation PT/OT/SLP Co-Evaluation/Treatment: Yes Reason for Co-Treatment: Complexity of the patient's impairments (multi-system involvement);For patient/therapist safety;To address functional/ADL transfers PT goals addressed during session: Mobility/safety with mobility;Balance;Proper use of DME;Strengthening/ROM OT goals addressed during session: ADL's and self-care       AM-PAC PT "6 Clicks" Mobility  Outcome Measure Help needed turning from your back to your side while in a flat bed without using bedrails?: A Little Help needed moving from lying on your back to sitting on the side of a flat bed without using bedrails?: A Little         6 Click Score: 6    End of Session Equipment Utilized During Treatment: Gait belt Activity Tolerance: Patient tolerated treatment well Patient left: in chair;with call bell/phone within reach;with chair alarm set Nurse Communication: Mobility status PT Visit  Diagnosis: Unsteadiness on feet (R26.81);Pain;Difficulty in walking, not elsewhere classified (R26.2) Pain - Right/Left:  Left Pain - part of body: Hip;Leg    Time: 8657-8469 PT Time Calculation (min) (ACUTE ONLY): 50 min   Charges:   PT Evaluation $PT Eval Moderate Complexity: 1 Mod   PT General Charges $$ ACUTE PT VISIT: 1 Visit         Conni Slipper, PT, DPT Acute Rehabilitation Services Secure Chat Preferred Office: [phone]   Marylynn Pearson 01/01/2023, 2:01 PM

## 2023-01-01 NOTE — Progress Notes (Signed)
PROGRESS NOTE  Ronald Rice ZOX:[id number] DOB: [birth year]   PCP: Merri Brunette, MD  Patient is from: Home.  Uses rollator at baseline.  DOA: 12/29/2022 LOS: 3  Chief complaints Chief Complaint  Patient presents with   Code Stroke     Brief Narrative / Interim history: 77 year old M with PMH of CLL with recurrence followed by Dr. Myna Hidalgo, CAD, OSA, dementia, DM-2, HTN and HLD brought to ED after found "unresponsive" by family and multiple episodes of seizure-like activity with EMS resulting in 50 mg of IV Versed.  Arrived in ED as code stroke.  CT head without acute finding.  PCCM consulted due to concern about airway protection, and he was admitted to ICU.  He did not require intubation.  LTM EEG negative for seizure.  Underwent lumbar puncture.  CSF study negative for meningitis.  Antibiotics discontinued.  CSF cytology without malignant cells but scattered lymphocytes and monocytes.  Transferred out of ICU on 8/10.  Remains on Keppra.  Oncology consulted.    MRI brain without acute finding.    Subjective: Seen and examined earlier this morning.  No major events overnight of this morning.  Reports "a little" lightheadedness.  Denies chest pain, palpitation or shortness of breath.  Denies GI or UTI symptoms.  Objective: Vitals:   01/01/23 0322 01/01/23 0802 01/01/23 1214 01/01/23 1511  BP: 124/67 131/74 110/66 123/71  Pulse: 82 84 76 70  Resp: 19   18  Temp: (!) 97.5 F (36.4 C) 98.7 F (37.1 C) (!) 97.5 F (36.4 C) 97.6 F (36.4 C)  TempSrc: Oral Oral Oral Oral  SpO2: 96% 99% 96% 97%  Weight:      Height:        Examination:  GENERAL: No apparent distress.  Nontoxic. HEENT: MMM.  Vision and hearing grossly intact.  NECK: Supple.  No apparent JVD.  RESP:  No IWOB.  Fair aeration bilaterally. CVS:  RRR. Heart sounds normal.  ABD/GI/GU: BS+. Abd soft, NTND.  MSK/EXT:  Moves extremities. No apparent deformity. No edema.  SKIN: no apparent skin lesion or wound NEURO:  Awake, alert and oriented x 4.  No apparent focal neuro deficit. PSYCH: Calm. Normal affect.   Procedures:  8/8-lumbar puncture by IR  Microbiology summarized: 8/7-COVID-19, influenza and RSV nonreactive 8/7-MRSA PCR screen nonreactive 8/7-blood cultures NGTD 8/8-CSF cultures and fungal cultures NGTD  Assessment and plan: Principal Problem:   Seizure (HCC)  Seizure-like activity/acute metabolic encephalopathy: Meningitis ruled out.  LTM EEG without seizure or epileptiform discharge .  Patient with recurrent CLL but CSF cytology negative for malignant cells but scattered lymphocytes and monocytes.  MRI brain without acute finding.  Has underlying dementia.  He is also on significant dose of Lyrica tramadol which could contribute.  Encephalopathy has resolved.  He is oriented x 4.  He has no focal neurodeficits on exam. -Continue p.o. Keppra 500 mg twice daily -Continue seizure precaution -PT/OT   SIRS: Had fever, leukocytosis and encephalopathy concerning for severe seizure but sepsis ruled out.  Stable off antibiotics.  Sepsis physiology resolved.  Recurrent CLL: Last seen by his oncologist on 10/05/2022.  At that time, there was concern about recurrence of CLL.  He is immunotherapy was on hold for 9 months prior.  The plan was to see him back in September -Consult oncology.  DM-2 with hyperglycemia and neuropathy: A1c 6.9%.  On metformin.  Recently started on [facility] Recent Labs  Lab 12/31/22 1107 12/31/22 1523 12/31/22 2108 01/01/23 0622 01/01/23 1214  GLUCAP  168* 171* 187* 140* 197*  -Continue SSI-resistant scale -Add Semglee 10 units daily -Further adjustment as appropriate. -Continue Lyrica.   Hypokalemia -Monitor replenish as appropriate -Hold Hygroton.   Hx of HTN, HLD. - ASA, atenolol, lipitor  Essential hypertension: Normotensive for most part -Hold hydralazine due to hypokalemia. -Continue home atenolol -Start amlodipine  Obstructive sleep apnea. - CPAP  while asleep   Hx of gout. -Allopurinol   BPH -Continue home Flomax  Morbid obesity Body mass index is 40.87 kg/m.          DVT prophylaxis:  enoxaparin (LOVENOX) injection 40 mg Start: 12/31/22 1015 SCDs Start: 12/29/22 1752  Code Status: Full code Family Communication: None at bedside Level of care: Telemetry Medical Status is: Inpatient Remains inpatient appropriate because: Seizure-like activity   Final disposition: SNF Consultants:  Neurology Pulmonology Oncology  55 minutes with more than 50% spent in reviewing records, counseling patient/family and coordinating care.   Sch Meds:  Scheduled Meds:  allopurinol  300 mg Oral Daily   amLODipine  10 mg Oral Daily   aspirin EC  81 mg Oral Daily   atenolol  25 mg Oral Daily   atorvastatin  40 mg Oral Daily   enoxaparin (LOVENOX) injection  40 mg Subcutaneous Q24H   insulin aspart  0-20 Units Subcutaneous TID WC   insulin aspart  0-5 Units Subcutaneous QHS   levETIRAcetam  500 mg Oral BID   mouth rinse  15 mL Mouth Rinse 4 times per day   pregabalin  150 mg Oral BID   Continuous Infusions:  sodium chloride Stopped (12/30/22 0736)   PRN Meds:.sodium chloride, acetaminophen, docusate sodium, LORazepam, LORazepam, mouth rinse, polyethylene glycol  Antimicrobials: Anti-infectives (From admission, onward)    Start     Dose/Rate Route Frequency Ordered Stop   12/30/22 2000  vancomycin (VANCOREADY) IVPB 1500 mg/300 mL  Status:  Discontinued        1,500 mg 150 mL/hr over 120 Minutes Intravenous Every 24 hours 12/29/22 2028 12/30/22 0755   12/30/22 0900  vancomycin (VANCOCIN) IVPB 1000 mg/200 mL premix  Status:  Discontinued        1,000 mg 200 mL/hr over 60 Minutes Intravenous Every 12 hours 12/30/22 0755 12/30/22 2010   12/30/22 0330  acyclovir (ZOVIRAX) 995 mg in dextrose 5 % 250 mL IVPB  Status:  Discontinued        10 mg/kg  99.4 kg (Adjusted) 269.9 mL/hr over 60 Minutes Intravenous Every 8 hours  12/29/22 2028 12/30/22 2010   12/29/22 2200  cefTRIAXone (ROCEPHIN) 2 g in sodium chloride 0.9 % 100 mL IVPB  Status:  Discontinued        2 g 200 mL/hr over 30 Minutes Intravenous Every 12 hours 12/29/22 1648 12/30/22 2010   12/29/22 2130  ampicillin (OMNIPEN) 2 g in sodium chloride 0.9 % 100 mL IVPB  Status:  Discontinued        2 g 300 mL/hr over 20 Minutes Intravenous Every 4 hours 12/29/22 2028 12/30/22 2010   12/29/22 1745  acyclovir (ZOVIRAX) 1,000 mg in dextrose 5 % 250 mL IVPB        10 mg/kg  100 kg (Adjusted) 270 mL/hr over 60 Minutes Intravenous  Once 12/29/22 1731 12/29/22 2041   12/29/22 1700  vancomycin (VANCOREADY) IVPB 2000 mg/400 mL        2,000 mg 200 mL/hr over 120 Minutes Intravenous  Once 12/29/22 1648 12/29/22 1947   12/29/22 1700  ampicillin (OMNIPEN) 2 g in sodium  chloride 0.9 % 100 mL IVPB        2 g 300 mL/hr over 20 Minutes Intravenous  Once 12/29/22 1648 12/29/22 1807        I have personally reviewed the following labs and images: CBC: Recent Labs  Lab 12/29/22 1645 12/29/22 1701 12/29/22 1735 12/30/22 0144 12/31/22 0432 01/01/23 0329  WBC 11.7*  --   --  15.1* 10.3 6.1  NEUTROABS 10.3*  --   --   --   --   --   HGB 14.6 13.6 13.6 13.7 11.9* 12.7*  HCT 40.0 40.0 40.0 37.6* 32.6* 35.0*  MCV 87.3  --   --  84.3 84.0 84.5  PLT 165  --   --  167 142* 158   BMP &GFR Recent Labs  Lab 12/29/22 1645 12/29/22 1701 12/29/22 1735 12/30/22 0144 12/30/22 2045 12/31/22 0140 01/01/23 0328 01/01/23 0329  NA 137 139 140 137  --  139  --  137  K 3.4* 3.4* 3.4* 3.6  --  3.3*  --  3.1*  CL 99 98  --  99  --  102  --  100  CO2 26  --   --  24  --  27  --  26  GLUCOSE 205* 201*  --  235*  --  131*  --  134*  BUN 13 13  --  11  --  13  --  10  CREATININE 1.41* 1.40*  --  1.12  --  1.40*  --  1.09  CALCIUM 8.9  --   --  8.8*  --  8.5*  --  8.4*  MG  --   --   --  1.3* 2.0 1.9 1.7  --   PHOS  --   --   --  3.6  --   --   --   --    Estimated  Creatinine Clearance: 83.8 mL/min (by C-G formula based on SCr of 1.09 mg/dL). Liver & Pancreas: Recent Labs  Lab 12/29/22 1645  AST 33  ALT 41  ALKPHOS 41  BILITOT 1.2  PROT 6.8  ALBUMIN 4.0   No results for input(s): "LIPASE", "AMYLASE" in the last 168 hours. No results for input(s): "AMMONIA" in the last 168 hours. Diabetic: Recent Labs    12/29/22 1705  HGBA1C 6.9*   Recent Labs  Lab 12/31/22 1107 12/31/22 1523 12/31/22 2108 01/01/23 0622 01/01/23 1214  GLUCAP 168* 171* 187* 140* 197*   Cardiac Enzymes: No results for input(s): "CKTOTAL", "CKMB", "CKMBINDEX", "TROPONINI" in the last 168 hours. No results for input(s): "PROBNP" in the last 8760 hours. Coagulation Profile: Recent Labs  Lab 12/29/22 1645  INR 1.0   Thyroid Function Tests: No results for input(s): "TSH", "T4TOTAL", "FREET4", "T3FREE", "THYROIDAB" in the last 72 hours. Lipid Profile: No results for input(s): "CHOL", "HDL", "LDLCALC", "TRIG", "CHOLHDL", "LDLDIRECT" in the last 72 hours. Anemia Panel: No results for input(s): "VITAMINB12", "FOLATE", "FERRITIN", "TIBC", "IRON", "RETICCTPCT" in the last 72 hours. Urine analysis:    Component Value Date/Time   COLORURINE YELLOW 12/29/2022 1751   APPEARANCEUR CLEAR 12/29/2022 1751   LABSPEC 1.015 12/29/2022 1751   PHURINE 5.0 12/29/2022 1751   GLUCOSEU NEGATIVE 12/29/2022 1751   HGBUR NEGATIVE 12/29/2022 1751   BILIRUBINUR NEGATIVE 12/29/2022 1751   KETONESUR NEGATIVE 12/29/2022 1751   PROTEINUR NEGATIVE 12/29/2022 1751   NITRITE NEGATIVE 12/29/2022 1751   LEUKOCYTESUR NEGATIVE 12/29/2022 1751   Sepsis Labs: Invalid input(s): "PROCALCITONIN", "LACTICIDVEN"  Microbiology:  Recent Results (from the past 240 hour(s))  Resp panel by RT-PCR (RSV, Flu A&B, Covid) Peripheral     Status: None   Collection Time: 12/29/22  4:49 PM   Specimen: Peripheral; Nasal Swab  Result Value Ref Range Status   SARS Coronavirus 2 by RT PCR NEGATIVE NEGATIVE Final    Influenza A by PCR NEGATIVE NEGATIVE Final   Influenza B by PCR NEGATIVE NEGATIVE Final    Comment: (NOTE) The Xpert Xpress SARS-CoV-2/FLU/RSV plus assay is intended as an aid in the diagnosis of influenza from Nasopharyngeal swab specimens and should not be used as a sole basis for treatment. Nasal washings and aspirates are unacceptable for Xpert Xpress SARS-CoV-2/FLU/RSV testing.  Fact Sheet for Patients: BloggerCourse.com  Fact Sheet for Healthcare Providers: SeriousBroker.it  This test is not yet approved or cleared by the [place] FDA and has been authorized for detection and/or diagnosis of SARS-CoV-2 by FDA under an Emergency Use Authorization (EUA). This EUA will remain in effect (meaning this test can be used) for the duration of the COVID-19 declaration under Section 564(b)(1) of the Act, 21 U.S.C. section 360bbb-3(b)(1), unless the authorization is terminated or revoked.     Resp Syncytial Virus by PCR NEGATIVE NEGATIVE Final    Comment: (NOTE) Fact Sheet for Patients: BloggerCourse.com  Fact Sheet for Healthcare Providers: SeriousBroker.it  This test is not yet approved or cleared by the [place] FDA and has been authorized for detection and/or diagnosis of SARS-CoV-2 by FDA under an Emergency Use Authorization (EUA). This EUA will remain in effect (meaning this test can be used) for the duration of the COVID-19 declaration under Section 564(b)(1) of the Act, 21 U.S.C. section 360bbb-3(b)(1), unless the authorization is terminated or revoked.  Performed at [facility], [address], [place], [place] [address]   Culture, blood (Routine x 2)     Status: None (Preliminary result)   Collection Time: 12/29/22  4:53 PM   Specimen: BLOOD RIGHT ARM  Result Value Ref Range Status   Specimen Description BLOOD RIGHT ARM  Final   Special Requests    Final    BOTTLES DRAWN AEROBIC AND ANAEROBIC Blood Culture adequate volume   Culture   Final    NO GROWTH 3 DAYS Performed at [facility], [address], [place], [place] [address]    Report Status PENDING  Incomplete  Culture, blood (Routine x 2)     Status: None (Preliminary result)   Collection Time: 12/29/22  4:53 PM   Specimen: BLOOD RIGHT HAND  Result Value Ref Range Status   Specimen Description BLOOD RIGHT HAND  Final   Special Requests   Final    BOTTLES DRAWN AEROBIC AND ANAEROBIC Blood Culture adequate volume   Culture   Final    NO GROWTH 3 DAYS Performed at [facility], [address], [place], [place] [address]    Report Status PENDING  Incomplete  MRSA Next Gen by PCR, Nasal     Status: None   Collection Time: 12/29/22  6:39 PM   Specimen: Nasal Mucosa; Nasal Swab  Result Value Ref Range Status   MRSA by PCR Next Gen NOT DETECTED NOT DETECTED Final    Comment: (NOTE) The GeneXpert MRSA Assay (FDA approved for NASAL specimens only), is one component of a comprehensive MRSA colonization surveillance program. It is not intended to diagnose MRSA infection nor to guide or monitor treatment for MRSA infections. Test performance is not FDA approved  in patients less than 24 years old. Performed at [facility], [address], [place], [place] [address]   CSF culture w Gram Stain     Status: None (Preliminary result)   Collection Time: 12/30/22  9:15 AM   Specimen: CSF; Cerebrospinal Fluid  Result Value Ref Range Status   Specimen Description CSF  Final   Special Requests NONE  Final   Gram Stain NO WBC SEEN NO ORGANISMS SEEN CYTOSPIN SMEAR   Final   Culture   Final    NO GROWTH 2 DAYS Performed at [facility], [address] [place], [place] [address]    Report Status PENDING  Incomplete  Culture, Fungus without Smear     Status: None (Preliminary result)   Collection Time: 12/30/22 12:50 PM   Specimen: PATH Cytology CSF;  Cerebrospinal Fluid  Result Value Ref Range Status   Specimen Description CSF  Final   Special Requests NONE  Final   Culture   Final    NO FUNGUS ISOLATED AFTER 2 DAYS Performed at [facility], [address], [place], [place] [address]    Report Status PENDING  Incomplete    Radiology Studies: MR BRAIN W WO CONTRAST  Result Date: 01/01/2023 CLINICAL DATA:  Seizure disorder.  Altered mental status. EXAM: MRI HEAD WITHOUT AND WITH CONTRAST TECHNIQUE: Multiplanar, multiecho pulse sequences of the brain and surrounding structures were obtained without and with intravenous contrast. CONTRAST:  10mL GADAVIST GADOBUTROL 1 MMOL/ML IV SOLN COMPARISON:  MRI head 07/22/2022 at [facility]. FINDINGS: Brain: No acute infarct, hemorrhage, or mass lesion is present. Mild generalized atrophy is stable. No significant white matter disease is present. The ventricles are proportionate to the degree of atrophy. No significant extraaxial fluid collection is present. Deep brain nuclei are within normal limits. The brainstem and cerebellum are within normal limits. The internal auditory canals are within normal limits. Midline structures are within normal limits. Vascular: Flow is present in the major intracranial arteries. Skull and upper cervical spine: ACDF noted at C3-4. The craniocervical junction is within normal limits. Sinuses/Orbits: The paranasal sinuses and mastoid air cells are clear. Bilateral exophthalmos is similar to prior exams. Globes and orbits are otherwise within normal limits. IMPRESSION: 1. Stable mild generalized atrophy. 2. No acute intracranial abnormality or significant interval change. Electronically Signed   By: Marin Roberts M.D.   On: 01/01/2023 13:41       T.  [facility]  If 7PM-7AM, please contact night-coverage [email or web address] 01/01/2023, 5:00 PM

## 2023-01-01 NOTE — Progress Notes (Signed)
Neurology Progress Note  Brief HPI: 77 year old patient with history of diffuse large cell non-Hodgkin's lymphoma, CLL, obesity, diabetes, hypertension, hyperlipidemia, CAD, diverticulosis and sleep apnea presented with altered mental status and seizure activity.  Yesterday, his wife went to wake him up and found him to be staring off into space and not responding.  She called EMS, and he had several witnessed seizures while en route to the hospital and was given 15 mg of Versed.  He was quite somnolent on arrival to the emergency department and would only open his eyes to noxious stimuli.  He was also febrile at that time with rectal temperature of 103.  CT head was negative for acute abnormality.  Patient was connected to long-term EEG, and no seizures have been seen so far.  Given seizures, altered mental status and fever, he was treated empirically for meningitis with ampicillin, ceftriaxone, vancomycin and acyclovir.    Subjective: Sleeping on my arrival to room. When asked, he is unsure if he has a spinal cord stimulator but on chart review it appears that he does and he has had MRIs since it was placed. Follows with Dr. Vickey Huger and Dr. Terrace Arabia with [facility].  He is agreeable to stay on Keppra long term.   On review of records, had similar presentation to another hospital in Feb 2024 (without witnessed seizure activity, but unexplained fever and AMS with negative workup and self-resolution)  Bone Stimulator Implantation of [employer] bone stimulator device on 06/04/14  Inventory item: Serial no.: [id number] Model/Cat no.: [id number]  Implant name: SPF Plus 60/M Spinal Fusion Stimulator Laterality: N/A Area: Back  Manufacturer: [employer] Action: Implanted Number used: 1  Lot no.: Exp. date: 11/01/2015   Exam: Vitals:   01/01/23 0322 01/01/23 0802  BP: 124/67 131/74  Pulse: 82 84  Resp: 19   Temp: (!) 97.5 F (36.4 C) 98.7 F (37.1 C)  SpO2: 96%    Gen: In bed, NAD Resp: non-labored breathing, no acute  distress Abd: soft, nt  Neuro: Mental Status: Alert and oriented to person, place and time with many questions about situation Cranial Nerves: Pupils equal round and reactive, extraocular movements intact, facial sensation symmetrical, face symmetrical, hearing intact to voice, phonation normal, shoulder shrug symmetrical, tongue midline Motor: Able to move all 4 extremities with good antigravity strength Sensory: Intact to light touch throughout Gait: Deferred  On attending exam at 10:15 PM resting comfortable, did not wake patient  No headache, neck pain or stiffness noted  Pertinent Labs:    Latest Ref Rng & Units 01/01/2023    3:29 AM 12/31/2022    4:32 AM 12/30/2022    1:44 AM  CBC  WBC 4.0 - 10.5 K/uL 6.1  10.3  15.1   Hemoglobin 13.0 - 17.0 g/dL 03.4  74.2  59.5   Hematocrit 39.0 - 52.0 % 35.0  32.6  37.6   Platelets 150 - 400 K/uL 158  142  167        Latest Ref Rng & Units 01/01/2023    3:29 AM 12/31/2022    1:40 AM 12/30/2022    1:44 AM  BMP  Glucose 70 - 99 mg/dL 638  756  433   BUN 8 - 23 mg/dL 10  13  11    Creatinine 0.61 - 1.24 mg/dL 2.95  1.88  4.16   Sodium 135 - 145 mmol/L 137  139  137   Potassium 3.5 - 5.1 mmol/L 3.1  3.3  3.6   Chloride 98 -  111 mmol/L 100  102  99   CO2 22 - 32 mmol/L 26  27  24    Calcium 8.9 - 10.3 mg/dL 8.4  8.5  8.8    CSF Protein - 98 Glucose - 109 Meningitis panel- negative Fungal culture- no growth day 1    Imaging Reviewed:  CT head: No acute abnormality  MRI brain with and without contrast personally reviewed, agree with radiology:   1. Stable mild generalized atrophy. 2. No acute intracranial abnormality or significant interval change.  EEG 8/8: This study is suggestive of moderate diffuse encephalopathy, nonspecific etiology. No seizures or epileptiform discharges were seen throughout the recording.   Assessment: 77 year old patient with history of non-Hodgkin's lymphoma, CLL, obesity, diabetes, hypertension,  hyperlipidemia, CAD, diverticulosis and sleep apnea presented with altered mental status and seizures.  He was found to be quite febrile and has been treated appropriately for meningitis, but MRI and LP studies are negative for acute process. Given significant temporal atrophy on imaging, he is at risk for recurrent seizures and I favor continuing keppra since he has had a similar presentation in the past.   Impression: concern for Acute meningitis, in the setting of fever, AMS, new onset seizure activity  Recommendations: -Continue Keppra 500mg  BID  -Lorazepam 4 mg IV for further seizure activity -Inpatient neurology will sign off at this time. Please reach out with additional questions/concerns. Please review seizure precautions w/ patient/family and include in discharge instructions  Standard seizure precautions: Per [facility] statutes, patients with seizures are not allowed to drive until  they have been seizure-free for six months. Use caution when using heavy equipment or power tools. Avoid working on ladders or at heights. Take showers instead of baths. Ensure the water temperature is not too high on the home water heater. Do not go swimming alone. When caring for infants or small children, sit down when holding, feeding, or changing them to minimize risk of injury to the child in the event you have a seizure.  To reduce risk of seizures, maintain good sleep hygiene avoid alcohol and illicit drug use, take all anti-seizure medications as prescribed.    Patient seen and examined by NP/APP with MD. MD to update note as needed.   Elmer Picker, DNP, FNP-BC [facility] Pager: [phone]  Attending Neurologist's note:  I personally saw this patient, gathering history, performing a brief examination, reviewing relevant labs, personally reviewing relevant imaging including MRI brain, and formulated the assessment and plan, adding the note above for completeness and  clarity to accurately reflect my thoughts  Brooke Dare MD-PhD [facility] [phone] Available 7 AM to 7 PM, outside these hours please contact Neurologist on call listed on AMION

## 2023-01-01 NOTE — Progress Notes (Signed)
 Off the unit/MRI

## 2023-01-01 NOTE — Evaluation (Signed)
Occupational Therapy Evaluation Patient Details Name: Ronald Rice MRN: [id number] DOB: 03/20/1946 Today's Date: 01/01/2023   History of Present Illness 77 y.o. male admitted 8/7 with AMS, unresponsive and sz. 8/8 EEG with encephalopathy and LP negative for infection. PMhx: dementia, diffuse large cell non-Hodgkin's lymphoma, CLL, obesity, DM, HTN, HLD, CAD, diverticulosis and sleep apnea   Clinical Impression   Ronald Rice was evaluated s/p the above admission list. He reports being mod I with rollator use at baseline. Upon evaluation the pt was limited by poor activity tolerance, LLE weakness, BUE fatigue, impaired cognition, unsteady gait and generalized weakness. Overall he needed up to mod A +2 for transfers and mobility, and was only able to tolerate bouts of ~6 ft at a time prior to needed extensive rest breaks. Due to the deficits listed below the pt also needs up to mod A for LB ADLs and set up A for UB ADLs in sitting. Pt will benefit from continued acute OT services and skilled inpatient follow up therapy, <3 hours/day, pending acute progress pt may have potential to d/c home        If plan is discharge home, recommend the following: A lot of help with walking and/or transfers;A lot of help with bathing/dressing/bathroom;Assistance with cooking/housework;Direct supervision/assist for medications management;Direct supervision/assist for financial management;Assist for transportation;Help with stairs or ramp for entrance;Supervision due to cognitive status    Functional Status Assessment  Patient has had a recent decline in their functional status and demonstrates the ability to make significant improvements in function in a reasonable and predictable amount of time.  Equipment Recommendations  None recommended by OT (defer)       Precautions / Restrictions Precautions Precautions: Fall Precaution Comments: seizure Restrictions Weight Bearing Restrictions: No      Mobility Bed  Mobility Overal bed mobility: Needs Assistance Bed Mobility: Supine to Sit     Supine to sit: Min assist          Transfers Overall transfer level: Needs assistance Equipment used: Rolling walker (2 wheels) Transfers: Sit to/from Stand Sit to Stand: Mod assist, +2 physical assistance, +2 safety/equipment           General transfer comment: min-mod A +2, several STS this date      Balance Overall balance assessment: Needs assistance Sitting-balance support: Feet supported Sitting balance-Leahy Scale: Normal     Standing balance support: Bilateral upper extremity supported, During functional activity Standing balance-Leahy Scale: Poor             ADL either performed or assessed with clinical judgement   ADL Overall ADL's : Needs assistance/impaired Eating/Feeding: Independent   Grooming: Set up;Sitting   Upper Body Bathing: Set up;Sitting   Lower Body Bathing: Moderate assistance;Sit to/from stand   Upper Body Dressing : Set up;Sitting   Lower Body Dressing: Moderate assistance;Sit to/from stand Lower Body Dressing Details (indicate cue type and reason): wife dons socks at baseline Toilet Transfer: Moderate assistance;+2 for physical assistance;+2 for safety/equipment;Ambulation;Rolling walker (2 wheels);BSC/3in1   Toileting- Clothing Manipulation and Hygiene: Contact guard assist;Sitting/lateral lean       Functional mobility during ADLs: Moderate assistance;+2 for physical assistance;+2 for safety/equipment General ADL Comments: unsteady, LLE weakness and poor activity tolerance     Vision Baseline Vision/History: 1 Wears glasses Vision Assessment?: No apparent visual deficits     Perception Perception: Not tested       Praxis Praxis: Not tested       Pertinent Vitals/Pain Pain Assessment Pain Assessment: Faces Faces Pain  Scale: Hurts little more Pain Location: LLE and R shoulder Pain Descriptors / Indicators: Discomfort Pain  Intervention(s): Limited activity within patient's tolerance, Monitored during session     Extremity/Trunk Assessment Upper Extremity Assessment Upper Extremity Assessment: Overall WFL for tasks assessed (reports R shoulder pain at baseline)   Lower Extremity Assessment Lower Extremity Assessment: Defer to PT evaluation   Cervical / Trunk Assessment Cervical / Trunk Assessment: Kyphotic (mild)   Communication Communication Communication: No apparent difficulties   Cognition Arousal: Alert Behavior During Therapy: Flat affect Overall Cognitive Status: No family/caregiver present to determine baseline cognitive functioning Area of Impairment: Orientation, Safety/judgement, Awareness, Problem solving                 Orientation Level: Disoriented to, Situation       Safety/Judgement: Decreased awareness of safety, Decreased awareness of deficits Awareness: Emergent Problem Solving: Slow processing, Requires verbal cues, Difficulty sequencing General Comments: Oriented except to situation. Pt stated, "I cannot remember" several times throguhout the session. Poor safety adn deficit awareness, needed cues for self monitoring     General Comments  VSS            Home Living Family/patient expects to be discharged to:: Private residence Living Arrangements: Spouse/significant other Available Help at Discharge: Family Type of Home: House Home Access: Stairs to enter;Ramped entrance     Home Layout: One level     Bathroom Shower/Tub: Producer, television/film/video: Standard     Home Equipment: Shower seat - built in;Grab bars - tub/shower;Rollator (4 wheels)          Prior Functioning/Environment Prior Level of Function : Independent/Modified Independent             Mobility Comments: Initally states he cant really walk, then reports he can walk community distances with rollator. denies falls ADLs Comments: reports he is mod I with ADLs, wife assists  minimally if needed. Does not drive        OT Problem List: Decreased range of motion;Decreased strength;Decreased activity tolerance;Impaired balance (sitting and/or standing);Decreased cognition;Decreased safety awareness;Decreased knowledge of use of DME or AE;Decreased knowledge of precautions      OT Treatment/Interventions: Self-care/ADL training;Therapeutic exercise;DME and/or AE instruction;Therapeutic activities;Patient/family education;Balance training    OT Goals(Current goals can be found in the care plan section) Acute Rehab OT Goals Patient Stated Goal: to get well OT Goal Formulation: With patient Time For Goal Achievement: 01/15/23 Potential to Achieve Goals: Good ADL Goals Pt Will Perform Grooming: with contact guard assist;standing Pt Will Perform Lower Body Dressing: with contact guard assist;sit to/from stand Pt Will Transfer to Toilet: with contact guard assist;ambulating;regular height toilet Additional ADL Goal #1: pt will tolerate at least 8 minutes of standing functional activity without sitting rest break to demonstrate increased endurance Additional ADL Goal #2: Pt will accurately complete IADL medication management task  OT Frequency: Min 1X/week    Co-evaluation PT/OT/SLP Co-Evaluation/Treatment: Yes Reason for Co-Treatment: Complexity of the patient's impairments (multi-system involvement);For patient/therapist safety;To address functional/ADL transfers   OT goals addressed during session: ADL's and self-care      AM-PAC OT "6 Clicks" Daily Activity     Outcome Measure Help from another person eating meals?: None Help from another person taking care of personal grooming?: A Little Help from another person toileting, which includes using toliet, bedpan, or urinal?: A Lot Help from another person bathing (including washing, rinsing, drying)?: A Lot Help from another person to put on and taking off regular  upper body clothing?: A Little Help from  another person to put on and taking off regular lower body clothing?: A Lot 6 Click Score: 16   End of Session Equipment Utilized During Treatment: Gait belt;Rolling walker (2 wheels) Nurse Communication: Mobility status  Activity Tolerance: Patient tolerated treatment well Patient left: in chair;with call bell/phone within reach;with chair alarm set  OT Visit Diagnosis: Unsteadiness on feet (R26.81);Other abnormalities of gait and mobility (R26.89);Muscle weakness (generalized) (M62.81)                Time: 6387-5643 OT Time Calculation (min): 52 min Charges:  OT General Charges $OT Visit: 1 Visit OT Evaluation $OT Eval Moderate Complexity: 1 Mod OT Treatments $Self Care/Home Management : 8-22 mins  Derenda Mis, OTR/L Acute Rehabilitation Services Office [phone] Secure Chat Communication Preferred   Donia Pounds 01/01/2023, 11:59 AM

## 2023-01-01 NOTE — Consult Note (Signed)
[name] is well-known to me.  He is a very nice 77 year old African-American male.  He has numerous health issues.  We have been seeing him for a past history of large cell lymphoma and CLL.  He has been off all therapy for about a year.  When we last saw him back in May, he was doing nicely.  He does have his health problems.  The biggest one clearly has been his diabetes.  I think when we had seen him, he was on Mounjaro.  I am not sure if he still taking this.  Unfortunately, he was admitted on 12/29/2022.  He came in because of altered mental status and seizure.  When he came in, his sodium was 137.  Potassium 3.4.  Blood sugar was 205.  BUN 13 creatinine 1.41.  Calcium 8.9.  His CBC showed a white cell count of 11.7.  Hemoglobin 14.6.  Platelet count 165,000.  His white cell differential showed 87 segs 5 lungs 7 monos.  He has been seen by neurology.  He did have a lumbar puncture.  The LP did not show any malignant cells.  He had a CT of the brain.  I think this was without contrast.  This looked unremarkable.  However, I am unsure exactly what these ASPECTS indicates.  There is some mention of a ganglionic level infarction in his supra ganglionic infarction.  As such, I do not know of a has had a stroke.  First, he was not sure who I was.  Then he finally realized who I was.  He said that he seemed to be doing okay a week ago.  He has had no bowel or bladder incontinence.  He has had no problems with COVID.  He has been evaluated by Neurology.  They are awaiting his MRI.  He is on Keppra.   His vital signs are temperature 97.5.  Pulse 76.  Blood pressure 110/66.  His head and neck exam shows no ocular or oral lesions.  Has good extraocular muscle movement.  He has no adenopathy in the neck.  There is no supraclavicular lymph nodes.  His lungs are clear bilaterally.  Cardiac exam slightly tachycardic but regular.  He has no murmurs, rubs or bruits.  Abdomen is soft.  Bowel sounds are  present.  He has no fluid wave.  He is obese.  He has no guarding or rebound tenderness.  Extremity shows no clubbing, cyanosis or edema.  He has surgical changes on his lower leg from past trauma.  Neurological exam shows no obvious neurological deficits.    [name] is a 77 year old African-American male.  He is well-known to me.  We have been seeing him for probably about 15 years.  He is off any treatment now for any hematologic or oncologic issue.  He has been stable.  He has been in remission.  His PET scan was done back in February which did not show any evidence of obvious disease.  I cannot imagine that this seizure is related to his CLL.  He had the LP which did not show any malignant cells.  We will have to see what the MRI has to pick up.  However, the CT of the brain was negative.  We will follow along and try to help out any way that we can.  I would have thought that his seizure and change in mental state may be related to low blood sugars but this really has not been a problem for  him.  I know that the staff on [address] will do a great job with him.  I appreciate everybody's help.   Christin Bach, MD  Fayrene Fearing 1:5

## 2023-01-02 DIAGNOSIS — E1165 Type 2 diabetes mellitus with hyperglycemia: Secondary | ICD-10-CM

## 2023-01-02 DIAGNOSIS — C9110 Chronic lymphocytic leukemia of B-cell type not having achieved remission: Secondary | ICD-10-CM

## 2023-01-02 DIAGNOSIS — I25110 Atherosclerotic heart disease of native coronary artery with unstable angina pectoris: Secondary | ICD-10-CM

## 2023-01-02 DIAGNOSIS — G9341 Metabolic encephalopathy: Secondary | ICD-10-CM | POA: Diagnosis not present

## 2023-01-02 DIAGNOSIS — G4730 Sleep apnea, unspecified: Secondary | ICD-10-CM

## 2023-01-02 DIAGNOSIS — E876 Hypokalemia: Secondary | ICD-10-CM | POA: Diagnosis not present

## 2023-01-02 DIAGNOSIS — R569 Unspecified convulsions: Secondary | ICD-10-CM | POA: Diagnosis not present

## 2023-01-02 LAB — CBC
HCT: 37.1 % — ABNORMAL LOW (ref 39.0–52.0)
Hemoglobin: 13.4 g/dL (ref 13.0–17.0)
MCH: 30.5 pg (ref 26.0–34.0)
MCHC: 36.1 g/dL — ABNORMAL HIGH (ref 30.0–36.0)
MCV: 84.5 fL (ref 80.0–100.0)
Platelets: 185 10*3/uL (ref 150–400)
RBC: 4.39 MIL/uL (ref 4.22–5.81)
RDW: 12.9 % (ref 11.5–15.5)
WBC: 4.6 10*3/uL (ref 4.0–10.5)
nRBC: 0 % (ref 0.0–0.2)

## 2023-01-02 LAB — GLUCOSE, CAPILLARY
Glucose-Capillary: 179 mg/dL — ABNORMAL HIGH (ref 70–99)
Glucose-Capillary: 165 mg/dL — ABNORMAL HIGH (ref 70–99)
Glucose-Capillary: 180 mg/dL — ABNORMAL HIGH (ref 70–99)
Glucose-Capillary: 178 mg/dL — ABNORMAL HIGH (ref 70–99)

## 2023-01-02 LAB — RENAL FUNCTION PANEL
Albumin: 3.4 g/dL — ABNORMAL LOW (ref 3.5–5.0)
Anion gap: 15 (ref 5–15)
BUN: 10 mg/dL (ref 8–23)
CO2: 25 mmol/L (ref 22–32)
Calcium: 9.2 mg/dL (ref 8.9–10.3)
Chloride: 98 mmol/L (ref 98–111)
Creatinine, Ser: 1.13 mg/dL (ref 0.61–1.24)
GFR, Estimated: >60 mL/min (ref >60)
Glucose, Bld: 161 mg/dL — ABNORMAL HIGH (ref 70–99)
Phosphorus: 3.3 mg/dL (ref 2.5–4.6)
Potassium: 3.3 mmol/L — ABNORMAL LOW (ref 3.5–5.1)
Sodium: 138 mmol/L (ref 135–145)

## 2023-01-02 LAB — MAGNESIUM: Magnesium: 1.8 mg/dL (ref 1.7–2.4)

## 2023-01-02 MED ORDER — POTASSIUM CHLORIDE CRYS ER 20 MEQ PO TBCR
40.0000 meq | EXTENDED_RELEASE_TABLET | ORAL | Status: AC
  Administered 2023-01-02 (×2): 40 meq via ORAL
  Filled 2023-01-02 (×2): qty 2

## 2023-01-02 MED ORDER — LEVETIRACETAM 500 MG PO TABS: 500.0000 mg | ORAL_TABLET | Freq: Two times a day (BID) | ORAL | 1 refills | Status: DC

## 2023-01-02 MED ORDER — AMLODIPINE BESYLATE 10 MG PO TABS: 10.0000 mg | ORAL_TABLET | Freq: Every day | ORAL | 1 refills | Status: AC

## 2023-01-02 NOTE — Progress Notes (Signed)
PROGRESS NOTE  Ronald Rice ZOX:[id number] DOB: 12/24/1945   PCP: Merri Brunette, MD  Patient is from: Home.  Uses rollator at baseline.  DOA: 12/29/2022 LOS: 4  Chief complaints Chief Complaint  Patient presents with   Code Stroke     Brief Narrative / Interim history: 77 year old M with PMH of CLL with recurrence followed by Dr. Myna Hidalgo, CAD, OSA, dementia, DM-2, HTN and HLD brought to ED after found "unresponsive" by family and multiple episodes of seizure-like activity with EMS resulting in 50 mg of IV Versed.  Arrived in ED as code stroke.  CT head without acute finding.  PCCM consulted due to concern about airway protection, and he was admitted to ICU.  He did not require intubation.  LTM EEG negative for seizure.  Underwent lumbar puncture.  CSF study negative for meningitis.  Antibiotics discontinued.  CSF cytology without malignant cells but scattered lymphocytes and monocytes.  Transferred out of ICU on 8/10.  Remains on Keppra.  Oncology consulted.    MRI brain without acute finding.  Physical therapy recommended home health.  Occupational therapy recommended SNF.  Patient's wife anxious about taking patient home given his physical deconditioning, and interested in SNF.  Subjective: Seen and examined earlier this morning.  Seen and examined earlier this morning.  No major events overnight of this morning.  No complaints.  Objective: Vitals:   01/01/23 2355 01/02/23 0416 01/02/23 0900 01/02/23 1221  BP: 126/81 128/83 135/85 109/74  Pulse: 79 85 80 80  Resp: 17 17 18 18   Temp: 98 F (36.7 C) 97.7 F (36.5 C) 98 F (36.7 C) 97.6 F (36.4 C)  TempSrc: Oral Oral Oral Oral  SpO2: 98% 96% 96% 95%  Weight:      Height:        Examination:  GENERAL: No apparent distress.  Nontoxic. HEENT: MMM.  Vision and hearing grossly intact.  NECK: Supple.  No apparent JVD.  RESP:  No IWOB.  Fair aeration bilaterally. CVS:  RRR. Heart sounds normal.  ABD/GI/GU: BS+. Abd soft, NTND.   MSK/EXT:  Moves extremities. No apparent deformity. No edema.  SKIN: no apparent skin lesion or wound NEURO: Awake, alert and oriented x 4.  No apparent focal neuro deficit. PSYCH: Calm. Normal affect.   Procedures:  8/8-lumbar puncture by IR  Microbiology summarized: 8/7-COVID-19, influenza and RSV nonreactive 8/7-MRSA PCR screen nonreactive 8/7-blood cultures NGTD 8/8-CSF cultures and fungal cultures NGTD  Assessment and plan: Principal Problem:   Seizure-like activity (HCC) Active Problems:   Chronic lymphoid leukemia (HCC)   CAD (coronary artery disease)   Uncontrolled type 2 diabetes mellitus with hyperglycemia, without long-term current use of insulin (HCC)   Severe sleep apnea   Acute metabolic encephalopathy   Morbid obesity (HCC)  Seizure-like activity/acute metabolic encephalopathy: Meningitis ruled out.  LTM EEG without seizure or epileptiform discharge .  Patient with recurrent CLL but CSF cytology negative for malignant cells but scattered lymphocytes and monocytes.  MRI brain without acute finding.  Has underlying dementia.  He is also on significant dose of Lyrica tramadol which could contribute.  Encephalopathy has resolved.  He is oriented x 4.  He has no focal neurodeficits on exam. -Neurology recommended continuing Keppra 500 mg twice daily and signed off. -Continue seizure precaution -Physical therapy recommended home health and DME.  Occupational therapy recommended SNF. -Physical therapy reassessment requested -OOB   SIRS: Had fever, leukocytosis and encephalopathy concerning for severe seizure but sepsis ruled out.  Stable off antibiotics.  Sepsis physiology resolved.  Recurrent CLL: Last seen by his oncologist on 10/05/2022.  At that time, there was concern about recurrence of CLL.  He is immunotherapy was on hold for 9 months prior.  The plan was to see him back in September -Evaluated by oncology.  No additional input.  Outpatient follow-up.  DM-2 with  hyperglycemia, hyperlipidemia and neuropathy: A1c 6.9%.  On metformin.  Recently started on [facility] Recent Labs  Lab 01/01/23 1214 01/01/23 1714 01/01/23 2137 01/02/23 0625 01/02/23 1219  GLUCAP 197* 168* 172* 179* 165*  -Continue SSI-resistant scale -Code continue Semglee 10 units daily -Further adjustment as appropriate. -Continue Lipitor -Continue Lyrica.   Hypokalemia -Monitor replenish as appropriate -Continue holding microtome.  Discontinue on discharge.  Essential hypertension: Normotensive for most part -Hold hydralazine due to hypokalemia. -Continue home atenolol -Start amlodipine  Obstructive sleep apnea. - CPAP while asleep   Hx of gout. -Allopurinol   BPH -Continue home Flomax  Morbid obesity Body mass index is 40.87 kg/m.          DVT prophylaxis:  enoxaparin (LOVENOX) injection 40 mg Start: 12/31/22 1015 SCDs Start: 12/29/22 1752  Code Status: Full code Family Communication: None at bedside Level of care: Telemetry Medical Status is: Inpatient Remains inpatient appropriate because: Seizure-like activity   Final disposition: SNF Consultants:  Neurology Pulmonology Oncology  35 minutes with more than 50% spent in reviewing records, counseling patient/family and coordinating care.   Sch Meds:  Scheduled Meds:  allopurinol  300 mg Oral Daily   amLODipine  10 mg Oral Daily   aspirin EC  81 mg Oral Daily   atenolol  25 mg Oral Daily   atorvastatin  40 mg Oral Daily   enoxaparin (LOVENOX) injection  40 mg Subcutaneous Q24H   insulin aspart  0-20 Units Subcutaneous TID WC   insulin aspart  0-5 Units Subcutaneous QHS   insulin glargine-yfgn  10 Units Subcutaneous q1800   levETIRAcetam  500 mg Oral BID   mouth rinse  15 mL Mouth Rinse 4 times per day   pregabalin  150 mg Oral BID   Continuous Infusions:  sodium chloride Stopped (12/30/22 0736)   PRN Meds:.sodium chloride, acetaminophen, docusate sodium, LORazepam, LORazepam, mouth  rinse, polyethylene glycol  Antimicrobials: Anti-infectives (From admission, onward)    Start     Dose/Rate Route Frequency Ordered Stop   12/30/22 2000  vancomycin (VANCOREADY) IVPB 1500 mg/300 mL  Status:  Discontinued        1,500 mg 150 mL/hr over 120 Minutes Intravenous Every 24 hours 12/29/22 2028 12/30/22 0755   12/30/22 0900  vancomycin (VANCOCIN) IVPB 1000 mg/200 mL premix  Status:  Discontinued        1,000 mg 200 mL/hr over 60 Minutes Intravenous Every 12 hours 12/30/22 0755 12/30/22 2010   12/30/22 0330  acyclovir (ZOVIRAX) 995 mg in dextrose 5 % 250 mL IVPB  Status:  Discontinued        10 mg/kg  99.4 kg (Adjusted) 269.9 mL/hr over 60 Minutes Intravenous Every 8 hours 12/29/22 2028 12/30/22 2010   12/29/22 2200  cefTRIAXone (ROCEPHIN) 2 g in sodium chloride 0.9 % 100 mL IVPB  Status:  Discontinued        2 g 200 mL/hr over 30 Minutes Intravenous Every 12 hours 12/29/22 1648 12/30/22 2010   12/29/22 2130  ampicillin (OMNIPEN) 2 g in sodium chloride 0.9 % 100 mL IVPB  Status:  Discontinued        2 g 300 mL/hr  over 20 Minutes Intravenous Every 4 hours 12/29/22 2028 12/30/22 2010   12/29/22 1745  acyclovir (ZOVIRAX) 1,000 mg in dextrose 5 % 250 mL IVPB        10 mg/kg  100 kg (Adjusted) 270 mL/hr over 60 Minutes Intravenous  Once 12/29/22 1731 12/29/22 2041   12/29/22 1700  vancomycin (VANCOREADY) IVPB 2000 mg/400 mL        2,000 mg 200 mL/hr over 120 Minutes Intravenous  Once 12/29/22 1648 12/29/22 1947   12/29/22 1700  ampicillin (OMNIPEN) 2 g in sodium chloride 0.9 % 100 mL IVPB        2 g 300 mL/hr over 20 Minutes Intravenous  Once 12/29/22 1648 12/29/22 1807        I have personally reviewed the following labs and images: CBC: Recent Labs  Lab 12/29/22 1645 12/29/22 1701 12/29/22 1735 12/30/22 0144 12/31/22 0432 01/01/23 0329 01/02/23 0232  WBC 11.7*  --   --  15.1* 10.3 6.1 4.6  NEUTROABS 10.3*  --   --   --   --   --   --   HGB 14.6   < > 13.6 13.7  11.9* 12.7* 13.4  HCT 40.0   < > 40.0 37.6* 32.6* 35.0* 37.1*  MCV 87.3  --   --  84.3 84.0 84.5 84.5  PLT 165  --   --  167 142* 158 185   < > = values in this interval not displayed.   BMP &GFR Recent Labs  Lab 12/29/22 1645 12/29/22 1701 12/29/22 1735 12/30/22 0144 12/30/22 2045 12/31/22 0140 01/01/23 0328 01/01/23 0329 01/02/23 0232  NA 137 139 140 137  --  139  --  137 138  K 3.4* 3.4* 3.4* 3.6  --  3.3*  --  3.1* 3.3*  CL 99 98  --  99  --  102  --  100 98  CO2 26  --   --  24  --  27  --  26 25  GLUCOSE 205* 201*  --  235*  --  131*  --  134* 161*  BUN 13 13  --  11  --  13  --  10 10  CREATININE 1.41* 1.40*  --  1.12  --  1.40*  --  1.09 1.13  CALCIUM 8.9  --   --  8.8*  --  8.5*  --  8.4* 9.2  MG  --   --   --  1.3* 2.0 1.9 1.7  --  1.8  PHOS  --   --   --  3.6  --   --   --   --  3.3   Estimated Creatinine Clearance: 80.9 mL/min (by C-G formula based on SCr of 1.13 mg/dL). Liver & Pancreas: Recent Labs  Lab 12/29/22 1645 01/02/23 0232  AST 33  --   ALT 41  --   ALKPHOS 41  --   BILITOT 1.2  --   PROT 6.8  --   ALBUMIN 4.0 3.4*   No results for input(s): "LIPASE", "AMYLASE" in the last 168 hours. No results for input(s): "AMMONIA" in the last 168 hours. Diabetic: No results for input(s): "HGBA1C" in the last 72 hours.  Recent Labs  Lab 01/01/23 1214 01/01/23 1714 01/01/23 2137 01/02/23 0625 01/02/23 1219  GLUCAP 197* 168* 172* 179* 165*   Cardiac Enzymes: No results for input(s): "CKTOTAL", "CKMB", "CKMBINDEX", "TROPONINI" in the last 168 hours. No results for input(s): "PROBNP" in the  last 8760 hours. Coagulation Profile: Recent Labs  Lab 12/29/22 1645  INR 1.0   Thyroid Function Tests: No results for input(s): "TSH", "T4TOTAL", "FREET4", "T3FREE", "THYROIDAB" in the last 72 hours. Lipid Profile: No results for input(s): "CHOL", "HDL", "LDLCALC", "TRIG", "CHOLHDL", "LDLDIRECT" in the last 72 hours. Anemia Panel: No results for input(s):  "VITAMINB12", "FOLATE", "FERRITIN", "TIBC", "IRON", "RETICCTPCT" in the last 72 hours. Urine analysis:    Component Value Date/Time   COLORURINE YELLOW 12/29/2022 1751   APPEARANCEUR CLEAR 12/29/2022 1751   LABSPEC 1.015 12/29/2022 1751   PHURINE 5.0 12/29/2022 1751   GLUCOSEU NEGATIVE 12/29/2022 1751   HGBUR NEGATIVE 12/29/2022 1751   BILIRUBINUR NEGATIVE 12/29/2022 1751   KETONESUR NEGATIVE 12/29/2022 1751   PROTEINUR NEGATIVE 12/29/2022 1751   NITRITE NEGATIVE 12/29/2022 1751   LEUKOCYTESUR NEGATIVE 12/29/2022 1751   Sepsis Labs: Invalid input(s): "PROCALCITONIN", "LACTICIDVEN"  Microbiology: Recent Results (from the past 240 hour(s))  Resp panel by RT-PCR (RSV, Flu A&B, Covid) Peripheral     Status: None   Collection Time: 12/29/22  4:49 PM   Specimen: Peripheral; Nasal Swab  Result Value Ref Range Status   SARS Coronavirus 2 by RT PCR NEGATIVE NEGATIVE Final   Influenza A by PCR NEGATIVE NEGATIVE Final   Influenza B by PCR NEGATIVE NEGATIVE Final    Comment: (NOTE) The Xpert Xpress SARS-CoV-2/FLU/RSV plus assay is intended as an aid in the diagnosis of influenza from Nasopharyngeal swab specimens and should not be used as a sole basis for treatment. Nasal washings and aspirates are unacceptable for Xpert Xpress SARS-CoV-2/FLU/RSV testing.  Fact Sheet for Patients: BloggerCourse.com  Fact Sheet for Healthcare Providers: SeriousBroker.it  This test is not yet approved or cleared by the [place] FDA and has been authorized for detection and/or diagnosis of SARS-CoV-2 by FDA under an Emergency Use Authorization (EUA). This EUA will remain in effect (meaning this test can be used) for the duration of the COVID-19 declaration under Section 564(b)(1) of the Act, 21 U.S.C. section 360bbb-3(b)(1), unless the authorization is terminated or revoked.     Resp Syncytial Virus by PCR NEGATIVE NEGATIVE Final    Comment:  (NOTE) Fact Sheet for Patients: BloggerCourse.com  Fact Sheet for Healthcare Providers: SeriousBroker.it  This test is not yet approved or cleared by the [place] FDA and has been authorized for detection and/or diagnosis of SARS-CoV-2 by FDA under an Emergency Use Authorization (EUA). This EUA will remain in effect (meaning this test can be used) for the duration of the COVID-19 declaration under Section 564(b)(1) of the Act, 21 U.S.C. section 360bbb-3(b)(1), unless the authorization is terminated or revoked.  Performed at [facility], [address], [place], [place] [address]   Culture, blood (Routine x 2)     Status: None (Preliminary result)   Collection Time: 12/29/22  4:53 PM   Specimen: BLOOD RIGHT ARM  Result Value Ref Range Status   Specimen Description BLOOD RIGHT ARM  Final   Special Requests   Final    BOTTLES DRAWN AEROBIC AND ANAEROBIC Blood Culture adequate volume   Culture   Final    NO GROWTH 3 DAYS Performed at [facility], [address], [place], [place] [address]    Report Status PENDING  Incomplete  Culture, blood (Routine x 2)     Status: None (Preliminary result)   Collection Time: 12/29/22  4:53 PM   Specimen: BLOOD RIGHT HAND  Result Value Ref Range Status   Specimen Description BLOOD  RIGHT HAND  Final   Special Requests   Final    BOTTLES DRAWN AEROBIC AND ANAEROBIC Blood Culture adequate volume   Culture   Final    NO GROWTH 3 DAYS Performed at [facility], [address], [place], [place] [address]    Report Status PENDING  Incomplete  MRSA Next Gen by PCR, Nasal     Status: None   Collection Time: 12/29/22  6:39 PM   Specimen: Nasal Mucosa; Nasal Swab  Result Value Ref Range Status   MRSA by PCR Next Gen NOT DETECTED NOT DETECTED Final    Comment: (NOTE) The GeneXpert MRSA Assay (FDA approved for NASAL specimens only), is one component of a comprehensive MRSA  colonization surveillance program. It is not intended to diagnose MRSA infection nor to guide or monitor treatment for MRSA infections. Test performance is not FDA approved in patients less than 14 years old. Performed at [facility], [address], [place], [place] [address]   CSF culture w Gram Stain     Status: None   Collection Time: 12/30/22  9:15 AM   Specimen: CSF; Cerebrospinal Fluid  Result Value Ref Range Status   Specimen Description CSF  Final   Special Requests NONE  Final   Gram Stain NO WBC SEEN NO ORGANISMS SEEN CYTOSPIN SMEAR   Final   Culture   Final    NO GROWTH 3 DAYS Performed at [facility], [address] [place], [place] [address]    Report Status 01/02/2023 FINAL  Final  Culture, Fungus without Smear     Status: None (Preliminary result)   Collection Time: 12/30/22 12:50 PM   Specimen: PATH Cytology CSF; Cerebrospinal Fluid  Result Value Ref Range Status   Specimen Description CSF  Final   Special Requests NONE  Final   Culture   Final    NO FUNGUS ISOLATED AFTER 3 DAYS Performed at [facility], [address], [place], [place] [address]    Report Status PENDING  Incomplete    Radiology Studies: No results found.     T.  [facility]  If 7PM-7AM, please contact night-coverage [email or web address] 01/02/2023, 2:16 PM

## 2023-01-02 NOTE — Plan of Care (Signed)
  Problem: Fluid Volume: Goal: Ability to maintain a balanced intake and output will improve Outcome: Progressing   Problem: Nutritional: Goal: Maintenance of adequate nutrition will improve Outcome: Progressing   Problem: Skin Integrity: Goal: Risk for impaired skin integrity will decrease Outcome: Progressing   Problem: Tissue Perfusion: Goal: Adequacy of tissue perfusion will improve Outcome: Progressing   

## 2023-01-02 NOTE — TOC Initial Note (Addendum)
Transition of Care [facility]) - Initial/Assessment Note    Patient Details  Name: Ronald Rice MRN: [id number] Date of Birth: December 07, 1945  Transition of Care [facility]) CM/SW Contact:    Lawerance Sabal, RN Phone Number: 01/02/2023, 8:52 AM  Clinical Narrative:                  Reviewed PT note with wife and patient. Wife states that patient always wants to come home after the hospital but it is increasingly difficult on her to care for him. She has been to visit and does not feel like she may be able to safely care for him. She would like PT to re-evaluate him before he comes home and she is encouraging him to go to SNF.  Updated CSW, MD, RN and ordered imminent PT eval for DC.    Patient has rollator at home that he prefers to RW and is active w [facility]    Barriers to Discharge: Continued Medical Work up   Patient Goals and CMS Choice            Expected Discharge Plan and Services         Expected Discharge Date: 01/02/23                                    Prior Living Arrangements/Services                       Activities of Daily Living      Permission Sought/Granted                  Emotional Assessment              Admission diagnosis:  Seizure (HCC) [R56.9] Altered mental status, unspecified altered mental status type [R41.82] Patient Active Problem List   Diagnosis Date Noted   Acute metabolic encephalopathy 01/01/2023   Morbid obesity (HCC) 01/01/2023   Seizure-like activity (HCC) 12/29/2022   REM sleep behavior disorder 10/17/2019   Treatment-emergent central sleep apnea 10/17/2019   Severe sleep apnea 09/17/2019   Excessive daytime sleepiness 09/17/2019   Nocturia more than twice per night 09/17/2019   Polyneuropathy associated with underlying disease (HCC) 08/07/2019   Complex sleep apnea syndrome 12/18/2018   Goals of care, counseling/discussion 10/31/2018   Diffuse large B-cell lymphoma of lymph nodes of multiple regions  (HCC) 10/31/2018   Cellulitis of left leg 10/27/2018   Displaced fracture of lateral malleolus of left fibula 09/13/2018   Dream enactment behavior 07/26/2018   Snoring 07/26/2018   Hypersomnia with sleep apnea 07/26/2018   Fever    Acute cystitis with hematuria    Ischemic stroke (HCC) 11/22/2017   Right hip pain 07/18/2017   Cerebellar ataxia in diseases classified elsewhere (HCC) 07/18/2017   Radiculopathy 04/20/2017   Chronic lymphoid leukemia (HCC) 01/25/2017   CAD (coronary artery disease) 10/31/2016   Influenza A 05/24/2016   Acute bronchitis 05/22/2016   Weakness 05/22/2016   Sepsis (HCC) 05/22/2016   Pedal edema 05/22/2016   Gait abnormality 07/28/2015   Nonspecific elevation of levels of transaminase and lactic acid dehydrogenase (LDH) 03/02/2015   Pseudarthrosis after fusion or arthrodesis 06/03/2014   Fusion of spine of cervical region 06/05/2013   Fusion of lumbar spine 03/07/2012   Benign prostatic hyperplasia 06/15/2011   Liver cyst 07/29/2009   Kidney cyst, acquired 06/17/2009   Uncontrolled type 2 diabetes mellitus with  hyperglycemia, without long-term current use of insulin (HCC) 12/31/2008   Diverticulosis of colon 08/03/2007   Hyperlipidemia 03/14/2007   Neuropathy, peripheral 12/03/2005   Allergic rhinitis 03/01/2000   Essential (primary) hypertension 10/03/1996   GERD (gastroesophageal reflux disease) 07/02/1995   PCP:  Merri Brunette, MD Pharmacy:   [id number] - [place], [place] - [address] [place] [address] Phone: [phone] Fax: [phone]  [place] [facility] [address] [place] [address] Phone: [phone] Fax: [phone]  [facility] - [place], [place] - [address] [place] [address] Phone: [phone] Fax: [phone]     Social Determinants of Health (SDOH) Social History: SDOH  Screenings   Food Insecurity: Low Risk  (07/22/2022)   Received from [facility], [facility]  Recent Concern: Food Insecurity - Medium Risk (04/29/2022)   Received from [facility], [facility]  Transportation Needs: No Transportation Needs (07/22/2022)   [facility] from [facility], [facility]  Recent Concern: Transportation Needs - Unmet Transportation Needs (04/29/2022)   Received from [facility] visits prior to 07/24/2022.  Utilities: Low Risk  (07/22/2022)   Received from [facility] visits prior to 07/24/2022., [facility] visits prior to 07/24/2022.  Tobacco Use: Medium Risk (12/29/2022)   SDOH Interventions:     Readmission Risk Interventions     No data to display

## 2023-01-03 DIAGNOSIS — G9341 Metabolic encephalopathy: Secondary | ICD-10-CM | POA: Diagnosis not present

## 2023-01-03 DIAGNOSIS — R569 Unspecified convulsions: Secondary | ICD-10-CM | POA: Diagnosis not present

## 2023-01-03 DIAGNOSIS — G4730 Sleep apnea, unspecified: Secondary | ICD-10-CM | POA: Diagnosis not present

## 2023-01-03 LAB — GLUCOSE, CAPILLARY
Glucose-Capillary: 161 mg/dL — ABNORMAL HIGH (ref 70–99)
Glucose-Capillary: 160 mg/dL — ABNORMAL HIGH (ref 70–99)

## 2023-01-03 MED ORDER — DICLOFENAC SODIUM 1 % EX GEL
2.0000 g | Freq: Four times a day (QID) | CUTANEOUS | Status: DC
  Administered 2023-01-03: 2 g via TOPICAL
  Filled 2023-01-03: qty 100

## 2023-01-03 NOTE — [place] FL2 (Signed)
[place] MEDICAID FL2 LEVEL OF CARE FORM     IDENTIFICATION  Patient Name: Ronald Rice Birthdate: 29-Jun-1945 Sex: male Admission Date (Current Location): 12/29/2022  [facility] and [place] Number:  Producer, television/film/video and Address:  [facility], [address] [place], [place] [address]      Provider Number: [id number]  Attending Physician Name and Address:  Almon Hercules, MD  Relative Name and Phone Number:       Current Level of Care: Hospital Recommended Level of Care: Skilled Nursing Facility Prior Approval Number:    Date Approved/Denied:   PASRR Number: [id number]  Discharge Plan: SNF    Current Diagnoses: Patient Active Problem List   Diagnosis Date Noted   Acute metabolic encephalopathy 01/01/2023   Morbid obesity (HCC) 01/01/2023   Seizure-like activity (HCC) 12/29/2022   REM sleep behavior disorder 10/17/2019   Treatment-emergent central sleep apnea 10/17/2019   Severe sleep apnea 09/17/2019   Excessive daytime sleepiness 09/17/2019   Nocturia more than twice per night 09/17/2019   Polyneuropathy associated with underlying disease (HCC) 08/07/2019   Complex sleep apnea syndrome 12/18/2018   Goals of care, counseling/discussion 10/31/2018   Diffuse large B-cell lymphoma of lymph nodes of multiple regions (HCC) 10/31/2018   Cellulitis of left leg 10/27/2018   Displaced fracture of lateral malleolus of left fibula 09/13/2018   Dream enactment behavior 07/26/2018   Snoring 07/26/2018   Hypersomnia with sleep apnea 07/26/2018   Fever    Acute cystitis with hematuria    Ischemic stroke (HCC) 11/22/2017   Right hip pain 07/18/2017   Cerebellar ataxia in diseases classified elsewhere (HCC) 07/18/2017   Radiculopathy 04/20/2017   Chronic lymphoid leukemia (HCC) 01/25/2017   CAD (coronary artery disease) 10/31/2016   Influenza A 05/24/2016   Acute bronchitis 05/22/2016   Weakness 05/22/2016   Sepsis (HCC) 05/22/2016   Pedal edema  05/22/2016   Gait abnormality 07/28/2015   Nonspecific elevation of levels of transaminase and lactic acid dehydrogenase (LDH) 03/02/2015   Pseudarthrosis after fusion or arthrodesis 06/03/2014   Fusion of spine of cervical region 06/05/2013   Fusion of lumbar spine 03/07/2012   Benign prostatic hyperplasia 06/15/2011   Liver cyst 07/29/2009   Kidney cyst, acquired 06/17/2009   Uncontrolled type 2 diabetes mellitus with hyperglycemia, without long-term current use of insulin (HCC) 12/31/2008   Diverticulosis of colon 08/03/2007   Hyperlipidemia 03/14/2007   Neuropathy, peripheral 12/03/2005   Allergic rhinitis 03/01/2000   Essential (primary) hypertension 10/03/1996   GERD (gastroesophageal reflux disease) 07/02/1995    Orientation RESPIRATION BLADDER Height & Weight     Self, Place, Situation  Normal Incontinent Weight: (!) 301 lb 5.9 oz (136.7 kg) Height:  6' (182.9 cm)  BEHAVIORAL SYMPTOMS/MOOD NEUROLOGICAL BOWEL NUTRITION STATUS    Convulsions/Seizures Incontinent Diet (carb modified)  AMBULATORY STATUS COMMUNICATION OF NEEDS Skin   Limited Assist Verbally Normal                       Personal Care Assistance Level of Assistance  Bathing, Dressing, Feeding Bathing Assistance: Limited assistance Feeding assistance: Limited assistance Dressing Assistance: Limited assistance     Functional Limitations Info             SPECIAL CARE FACTORS FREQUENCY  PT (By licensed PT), OT (By licensed OT)     PT Frequency: 5x/wk OT Frequency: 5x/wk            Contractures Contractures Info: Not present  Additional Factors Info  Code Status, Allergies, Insulin Sliding Scale Code Status Info: Full Allergies Info: NKA   Insulin Sliding Scale Info: see DC summary       Current Medications (01/03/2023):  This is the current hospital active medication list Current Facility-Administered Medications  Medication Dose Route Frequency Provider Last Rate Last Admin   0.9  %  sodium chloride infusion   Intravenous PRN Coralyn Helling, MD   Stopped at 12/30/22 0736   acetaminophen (TYLENOL) tablet 650 mg  650 mg Oral Q6H PRN Candelaria Stagers T, MD   650 mg at 01/01/23 1219   allopurinol (ZYLOPRIM) tablet 300 mg  300 mg Oral Daily Coralyn Helling, MD   300 mg at 01/03/23 0942   amLODipine (NORVASC) tablet 10 mg  10 mg Oral Daily Candelaria Stagers T, MD   10 mg at 01/03/23 0944   aspirin EC tablet 81 mg  81 mg Oral Daily Coralyn Helling, MD   81 mg at 01/03/23 0942   atenolol (TENORMIN) tablet 25 mg  25 mg Oral Daily Coralyn Helling, MD   25 mg at 01/03/23 0941   atorvastatin (LIPITOR) tablet 40 mg  40 mg Oral Daily Coralyn Helling, MD   40 mg at 01/03/23 0941   diclofenac Sodium (VOLTAREN) 1 % topical gel 2 g  2 g Topical QID Candelaria Stagers T, MD   2 g at 01/03/23 1045   docusate sodium (COLACE) capsule 100 mg  100 mg Oral BID PRN Coralyn Helling, MD   100 mg at 01/01/23 0812   enoxaparin (LOVENOX) injection 40 mg  40 mg Subcutaneous Q24H Coralyn Helling, MD   40 mg at 01/03/23 0944   insulin aspart (novoLOG) injection 0-20 Units  0-20 Units Subcutaneous TID WC Coralyn Helling, MD   4 Units at 01/03/23 1610   insulin aspart (novoLOG) injection 0-5 Units  0-5 Units Subcutaneous QHS Sood, Vineet, MD       insulin glargine-yfgn (SEMGLEE) injection 10 Units  10 Units Subcutaneous q1800 Candelaria Stagers T, MD   10 Units at 01/02/23 1842   levETIRAcetam (KEPPRA) tablet 500 mg  500 mg Oral BID Coralyn Helling, MD   500 mg at 01/03/23 0944   LORazepam (ATIVAN) injection 4 mg  4 mg Intravenous Q2H PRN Coralyn Helling, MD       LORazepam (ATIVAN) tablet 1 mg  1 mg Oral PRN Almon Hercules, MD       Oral care mouth rinse  15 mL Mouth Rinse 4 times per day Coralyn Helling, MD   15 mL at 01/03/23 9604   Oral care mouth rinse  15 mL Mouth Rinse PRN Coralyn Helling, MD       polyethylene glycol (MIRALAX / GLYCOLAX) packet 17 g  17 g Oral Daily PRN Coralyn Helling, MD   17 g at 01/02/23 5409   pregabalin (LYRICA) capsule 150 mg  150 mg Oral  BID Coralyn Helling, MD   150 mg at 01/03/23 8119     Discharge Medications: Please see discharge summary for a list of discharge medications.  Relevant Imaging Results:  Relevant Lab Results:   Additional Information SS#: [id number]  Baldemar Lenis, LCSW

## 2023-01-03 NOTE — Discharge Summary (Signed)
Physician Discharge Summary  Ronald Rice ZHY:[id number] DOB: 1946/04/19 DOA: 12/29/2022  PCP: Merri Brunette, MD  Admit date: 12/29/2022 Discharge date: 01/03/2023 Admitted From: Home Disposition: Home Recommendations for Outpatient Follow-up:  Follow up with PCP in 1 week Outpatient follow-up with neurology in 4 to 6 weeks Check CMP and CBC at follow-up Please follow up on the following pending results: None  Home Health: PT/OT Equipment/Devices: Rolling walker  Discharge Condition: Stable CODE STATUS: Full code  Follow-up Information     Merri Brunette, MD. Schedule an appointment as soon as possible for a visit in 1 week(s).   Specialty: Internal Medicine Contact information: [address] [place] [address] [phone]         [facility] up.   Specialty: Home Health Services Why: The home health agency will contact you for the next home visit Contact information: [address] [place] [address] [phone]                 Hospital course 77 year old M with PMH of CLL with recurrence followed by Dr. Myna Hidalgo, CAD, OSA, dementia, DM-2, HTN and HLD brought to ED after found "unresponsive" by family and multiple episodes of seizure-like activity with EMS resulting in 50 mg of IV Versed.  Arrived in ED as code stroke.  CT head without acute finding.  PCCM consulted due to concern about airway protection, and he was admitted to ICU.  He did not require intubation.  LTM EEG negative for seizure.  Underwent lumbar puncture.  CSF study negative for meningitis.  Antibiotics discontinued.  CSF cytology without malignant cells but scattered lymphocytes and monocytes.  Transferred out of ICU on 8/10.  Remains on Keppra.  Oncology consulted.     MRI brain without acute finding.  Evaluated by oncology who did not feel his presentation is related to CLL.  Cleared for discharge by neurology on Keppra 500 mg twice a day.  Tramadol  discontinued.  Therapy recommended SNF but patient improved and family decided to take patient home with home health and DME.  Outpatient follow-up with PCP and neurology as above.  Discussed general seizure precaution as in discharge instruction below  See individual problem list below for more.   Problems addressed during this hospitalization Principal Problem:   Seizure-like activity (HCC) Active Problems:   Chronic lymphoid leukemia (HCC)   CAD (coronary artery disease)   Uncontrolled type 2 diabetes mellitus with hyperglycemia, without long-term current use of insulin (HCC)   Severe sleep apnea   Acute metabolic encephalopathy   Morbid obesity (HCC)   Seizure-like activity/acute metabolic encephalopathy: Meningitis ruled out.  LTM EEG without seizure or epileptiform discharge .  Patient with recurrent CLL but CSF cytology negative for malignant cells but scattered lymphocytes and monocytes.  MRI brain without acute finding.  Has underlying dementia.  He is also on significant dose of Lyrica and tramadol which could contribute.  Encephalopathy has resolved.  He is oriented x 4.  He has no focal neurodeficits on exam. -Neurology recommended continuing Keppra 500 mg twice daily and signed off. -Seizure precaution as in discharge instruction below -Discontinue tramadol -Outpatient follow-up with neurology in 4 to 6 weeks   SIRS: Had fever, leukocytosis and encephalopathy concerning for severe seizure but sepsis ruled out.  Stable off antibiotics.  Sepsis physiology resolved.  Sepsis ruled out   Recurrent CLL: Last seen by his oncologist on 10/05/2022.  At that time, there was concern about recurrence  of CLL.  He is immunotherapy was on hold for 9 months prior.  The plan was to see him back in September -Evaluated by oncology.  No additional input.  Outpatient follow-up.   DM-2 with hyperglycemia, hyperlipidemia and neuropathy: A1c 6.9%.  On metformin.  Recently started on  Mounjaro -Continue home meds   Hypokalemia: Resolved -Monitor replenish as appropriate -Discontinued Hygroton on discharge   Essential hypertension: Normotensive for most part -Continue atenolol and amlodipine -Discontinued Hygroton due to hypokalemia and risk of dehydration   Obstructive sleep apnea. - CPAP while asleep   Hx of gout. -Allopurinol   BPH -Continue home Flomax   Morbid obesity Body mass index is 40.87 kg/m           Time spent 35 minutes  Vital signs Vitals:   01/02/23 2343 01/03/23 0315 01/03/23 0848 01/03/23 1232  BP: 131/79 123/83 117/84 103/78  Pulse: 79 83 83 82  Temp: 97.8 F (36.6 C) 97.6 F (36.4 C) 98 F (36.7 C) 97.6 F (36.4 C)  Resp:   18 18  Height:      Weight:      SpO2: 97% 96% 94% 97%  TempSrc: Oral Oral Oral Oral  BMI (Calculated):         Discharge exam  GENERAL: No apparent distress.  Nontoxic. HEENT: MMM.  Vision and hearing grossly intact.  NECK: Supple.  No apparent JVD.  RESP:  No IWOB.  Fair aeration bilaterally. CVS:  RRR. Heart sounds normal.  ABD/GI/GU: BS+. Abd soft, NTND.  MSK/EXT:  Moves extremities. No apparent deformity. No edema.  SKIN: no apparent skin lesion or wound NEURO: Awake and alert. Oriented appropriately.  No apparent focal neuro deficit. PSYCH: Calm. Normal affect.   Discharge Instructions Discharge Instructions     Diet - low sodium heart healthy   Complete by: As directed    Diet Carb Modified   Complete by: As directed    Discharge instructions   Complete by: As directed    It has been a pleasure taking care of you!  You were hospitalized seizure for which you have been started on medication (Keppra).  We have stopped your tramadol since it can increase your risk of seizure.  Pregabalin (Lyrica) can increase your risk of confusion or fall.  Please discuss this with your primary care doctor.  Follow-up with your primary care doctor in 1 to 2 weeks or sooner if needed.  Per [facility] statutes, patients with seizures are not allowed to drive until  they have been seizure-free for six months. Use caution when using heavy equipment or power tools. Avoid working on ladders or at heights. Take showers instead of baths. Ensure the water temperature is not too high on the home water heater. Do not go swimming alone. When caring for infants or small children, sit down when holding, feeding, or changing them to minimize risk of injury to the child in the event you have a seizure.  To reduce risk of seizures, maintain good sleep hygiene avoid alcohol and illicit drug use, take all anti-seizure medications as prescribed.     Take care,   Increase activity slowly   Complete by: As directed       Allergies as of 01/03/2023   No Known Allergies      Medication List     STOP taking these medications    chlorthalidone 25 MG tablet Commonly known as: HYGROTON   potassium chloride 10 MEQ tablet Commonly known  asJerene Dilling   traMADol 50 MG tablet Commonly known as: ULTRAM       TAKE these medications    acetaminophen 500 MG tablet Commonly known as: TYLENOL Take 1,000 mg by mouth every 6 (six) hours as needed (pain).   allopurinol 300 MG tablet Commonly known as: ZYLOPRIM Take 300 mg by mouth daily.   amLODipine 10 MG tablet Commonly known as: NORVASC Take 1 tablet (10 mg total) by mouth daily.   aspirin EC 81 MG tablet Take 81 mg by mouth daily.   atenolol 25 MG tablet Commonly known as: TENORMIN Take 1 tablet (25 mg total) by mouth daily.   atorvastatin 40 MG tablet Commonly known as: LIPITOR Take 1 tablet (40 mg total) by mouth daily.   CINNAMON PO Take 1 tablet by mouth daily.   donepezil 5 MG tablet Commonly known as: ARICEPT Take 1 tablet by mouth once daily.   levETIRAcetam 500 MG tablet Commonly known as: KEPPRA Take 1 tablet (500 mg total) by mouth 2 (two) times daily.   metFORMIN 500 MG 24 hr tablet Commonly known as:  GLUCOPHAGE-XR Take 2 tablets (1,000 mg total) by mouth 2 (two) times daily with food.   Mounjaro 2.5 MG/0.5ML Pen Generic drug: tirzepatide Inject 2.5 mg into the skin every 7 (seven) days.   pregabalin 75 MG capsule Commonly known as: LYRICA Take 2 capsules (150 mg total) by mouth 2 (two) times daily.   pyridoxine 250 MG tablet Commonly known as: B-6 Take 250 mg by mouth daily.   tadalafil 10 MG tablet Commonly known as: CIALIS Take 1 tablet (10 mg total) by mouth daily as needed.   tamsulosin 0.4 MG Caps capsule Commonly known as: FLOMAX Take 1 capsule (0.4 mg total) by mouth daily.   VITAMIN D3 PO Take 1 tablet by mouth daily.               Durable Medical Equipment  (From admission, onward)           Start     Ordered   01/02/23 0824  For home use only DME Walker rolling  Once       Question Answer Comment  Walker: With 5 Inch Wheels   Patient needs a walker to treat with the following condition Unsteady gait      01/02/23 0824            Consultations: Neurology  Procedures/Studies:   MR BRAIN W WO CONTRAST  Result Date: 01/01/2023 CLINICAL DATA:  Seizure disorder.  Altered mental status. EXAM: MRI HEAD WITHOUT AND WITH CONTRAST TECHNIQUE: Multiplanar, multiecho pulse sequences of the brain and surrounding structures were obtained without and with intravenous contrast. CONTRAST:  10mL GADAVIST GADOBUTROL 1 MMOL/ML IV SOLN COMPARISON:  MRI head 07/22/2022 at [facility] FINDINGS: Brain: No acute infarct, hemorrhage, or mass lesion is present. Mild generalized atrophy is stable. No significant white matter disease is present. The ventricles are proportionate to the degree of atrophy. No significant extraaxial fluid collection is present. Deep brain nuclei are within normal limits. The brainstem and cerebellum are within normal limits. The internal auditory canals are within normal limits. Midline structures are  within normal limits. Vascular: Flow is present in the major intracranial arteries. Skull and upper cervical spine: ACDF noted at C3-4. The craniocervical junction is within normal limits. Sinuses/Orbits: The paranasal sinuses and mastoid air cells are clear. Bilateral exophthalmos is similar to prior exams. Globes and orbits are  otherwise within normal limits. IMPRESSION: 1. Stable mild generalized atrophy. 2. No acute intracranial abnormality or significant interval change. Electronically Signed   By: Marin Roberts M.D.   On: 01/01/2023 13:41   DG FL GUIDED LUMBAR PUNCTURE  Result Date: 12/30/2022 CLINICAL DATA:  Patient with altered mental status, seizures, fever. Clinical concern for meningitis. EXAM: DIAGNOSTIC LUMBAR PUNCTURE UNDER FLUOROSCOPIC GUIDANCE COMPARISON:  None Available. FLUOROSCOPY: Radiation Exposure Index (as provided by the fluoroscopic device): 36.50 mGy Kerma PROCEDURE: Informed consent was obtained from the patient prior to the procedure, including potential complications of headache, allergy, and pain. With the patient prone, the lower back was prepped with Betadine. 1% Lidocaine was used for local anesthesia. Lumbar puncture was performed at the L4-L5 level using a 22 gauge needle with return of clear CSF with an opening pressure of 12 cm water. 11 ml of CSF were obtained for laboratory studies. The patient tolerated the procedure well and there were no apparent complications. IMPRESSION: 1. Technically successful lumbar puncture under fluoroscopic guidance at L4-L5 level. 2. Opening pressure of 12 cm water with 11 mL of clear CSF collected for laboratory studies. Procedure performed by Mina Marble, PA-C and supervised and interpreted by Dr Myles Rosenthal Electronically Signed   By: Danae Orleans M.D.   On: 12/30/2022 14:38   Overnight EEG with video  Result Date: 12/30/2022 Charlsie Quest, MD     12/30/2022  6:17 PM Patient Name: Maximilian Hildebrandt MRN: [id number] Epilepsy Attending:  Charlsie Quest Referring Physician/Provider: Marjorie Smolder, NP Duration: 12/29/2022 1154 to 12/30/2022 1203 Patient history: 77 year old patient with history of diffuse large cell non-Hodgkin's lymphoma, CLL, obesity, diabetes, hypertension, hyperlipidemia, CAD, diverticulosis and sleep apnea presents with first altered mental status and then witnessed seizure activity while en route to the hospital. EEG to evaluate for seizure. Level of alertness: Awake, asleep AEDs during EEG study: LEV Technical aspects: This EEG study was done with scalp electrodes positioned according to the 10-20 International system of electrode placement. Electrical activity was reviewed with band pass filter of 1-70Hz , sensitivity of 7 uV/mm, display speed of 11mm/sec with a 60Hz  notched filter applied as appropriate. EEG data were recorded continuously and digitally stored.  Video monitoring was available and reviewed as appropriate. Description: The posterior dominant rhythm consists of 7.5 Hz activity of moderate voltage (25-35 uV) seen predominantly in posterior head regions, symmetric and reactive to eye opening and eye closing. Sleep was characterized by vertex waves, sleep spindles (12 to 14 Hz), maximal frontocentral region. EEG showed continuous generalized 5 to 7 Hz theta slowing. Hyperventilation and photic stimulation were not performed.   ABNORMALITY - Continuous slow, generalized IMPRESSION: This study is suggestive of moderate diffuse encephalopathy, nonspecific etiology. No seizures or epileptiform discharges were seen throughout the recording. Charlsie Quest   DG Chest Port 1 View  Result Date: 12/29/2022 CLINICAL DATA:  Sepsis, code stroke EXAM: PORTABLE CHEST 1 VIEW COMPARISON:  07/13/2022 FINDINGS: Lungs are clear.  No pleural effusion or pneumothorax. The heart is normal in size. Right chest power port terminates at the cavoatrial junction. IMPRESSION: No acute cardiopulmonary disease. Electronically Signed    By: Charline Bills M.D.   On: 12/29/2022 17:50   CT HEAD CODE STROKE WO CONTRAST  Result Date: 12/29/2022 CLINICAL DATA:  Code stroke. Neuro deficit, acute, stroke suspected. Right-sided gaze. Witnessed seizure and vomiting. Patient is unresponsive. EXAM: CT HEAD WITHOUT CONTRAST TECHNIQUE: Contiguous axial images were obtained from the base of the skull  through the vertex without intravenous contrast. RADIATION DOSE REDUCTION: This exam was performed according to the departmental dose-optimization program which includes automated exposure control, adjustment of the mA and/or kV according to patient size and/or use of iterative reconstruction technique. COMPARISON:  CT head without contrast 07/21/2022 FINDINGS: Brain: Mild atrophy and white matter changes are stable. No acute infarct, hemorrhage, or mass lesion is present. Deep brain nuclei are within normal limits. The ventricles are of normal size. The brainstem and cerebellum are within normal limits. Midline structures are within normal limits. Vascular: No hyperdense vessel or unexpected calcification. Skull: Calvarium is intact. No focal lytic or blastic lesions are present. No significant extracranial soft tissue lesion is present. Sinuses/Orbits: The paranasal sinuses and mastoid air cells are clear. The globes and orbits are within normal limits. ASPECTS [facility] Stroke Program Early CT Score) - Ganglionic level infarction (caudate, lentiform nuclei, internal capsule, insula, M1-M3 cortex): 7/7 - Supraganglionic infarction (M4-M6 cortex): 3/3 Total score (0-10 with 10 being normal): 10/10 IMPRESSION: 1. No acute intracranial abnormality or significant interval change. 2. Stable mild atrophy and white matter disease. This likely reflects the sequela of chronic microvascular ischemia. 3. Aspects is 10/10. The above was relayed via text pager to Dr. Marchelle Folks on 12/29/2022 at 17:09 . Electronically Signed   By: Marin Roberts M.D.   On:  12/29/2022 17:10       The results of significant diagnostics from this hospitalization (including imaging, microbiology, ancillary and laboratory) are listed below for reference.     Microbiology: Recent Results (from the past 240 hour(s))  Resp panel by RT-PCR (RSV, Flu A&B, Covid) Peripheral     Status: None   Collection Time: 12/29/22  4:49 PM   Specimen: Peripheral; Nasal Swab  Result Value Ref Range Status   SARS Coronavirus 2 by RT PCR NEGATIVE NEGATIVE Final   Influenza A by PCR NEGATIVE NEGATIVE Final   Influenza B by PCR NEGATIVE NEGATIVE Final    Comment: (NOTE) The Xpert Xpress SARS-CoV-2/FLU/RSV plus assay is intended as an aid in the diagnosis of influenza from Nasopharyngeal swab specimens and should not be used as a sole basis for treatment. Nasal washings and aspirates are unacceptable for Xpert Xpress SARS-CoV-2/FLU/RSV testing.  Fact Sheet for Patients: BloggerCourse.com  Fact Sheet for Healthcare Providers: SeriousBroker.it  This test is not yet approved or cleared by the [place] FDA and has been authorized for detection and/or diagnosis of SARS-CoV-2 by FDA under an Emergency Use Authorization (EUA). This EUA will remain in effect (meaning this test can be used) for the duration of the COVID-19 declaration under Section 564(b)(1) of the Act, 21 U.S.C. section 360bbb-3(b)(1), unless the authorization is terminated or revoked.     Resp Syncytial Virus by PCR NEGATIVE NEGATIVE Final    Comment: (NOTE) Fact Sheet for Patients: BloggerCourse.com  Fact Sheet for Healthcare Providers: SeriousBroker.it  This test is not yet approved or cleared by the [place] FDA and has been authorized for detection and/or diagnosis of SARS-CoV-2 by FDA under an Emergency Use Authorization (EUA). This EUA will remain in effect (meaning this test can be used) for  the duration of the COVID-19 declaration under Section 564(b)(1) of the Act, 21 U.S.C. section 360bbb-3(b)(1), unless the authorization is terminated or revoked.  Performed at [facility], [address], [place], [place] [address]   Culture, blood (Routine x 2)     Status: None   Collection Time: 12/29/22  4:53 PM   Specimen:  BLOOD RIGHT ARM  Result Value Ref Range Status   Specimen Description BLOOD RIGHT ARM  Final   Special Requests   Final    BOTTLES DRAWN AEROBIC AND ANAEROBIC Blood Culture adequate volume   Culture   Final    NO GROWTH 5 DAYS Performed at [facility], [address] [place], [place] [address]    Report Status 01/03/2023 FINAL  Final  Culture, blood (Routine x 2)     Status: None   Collection Time: 12/29/22  4:53 PM   Specimen: BLOOD RIGHT HAND  Result Value Ref Range Status   Specimen Description BLOOD RIGHT HAND  Final   Special Requests   Final    BOTTLES DRAWN AEROBIC AND ANAEROBIC Blood Culture adequate volume   Culture   Final    NO GROWTH 5 DAYS Performed at [facility], [address] [place], [place] [address]    Report Status 01/03/2023 FINAL  Final  MRSA Next Gen by PCR, Nasal     Status: None   Collection Time: 12/29/22  6:39 PM   Specimen: Nasal Mucosa; Nasal Swab  Result Value Ref Range Status   MRSA by PCR Next Gen NOT DETECTED NOT DETECTED Final    Comment: (NOTE) The GeneXpert MRSA Assay (FDA approved for NASAL specimens only), is one component of a comprehensive MRSA colonization surveillance program. It is not intended to diagnose MRSA infection nor to guide or monitor treatment for MRSA infections. Test performance is not FDA approved in patients less than 31 years old. Performed at [facility], [address] [place], [place] [address]   CSF culture w Gram Stain     Status: None   Collection Time: 12/30/22  9:15 AM   Specimen: CSF; Cerebrospinal Fluid  Result Value Ref Range Status   Specimen  Description CSF  Final   Special Requests NONE  Final   Gram Stain NO WBC SEEN NO ORGANISMS SEEN CYTOSPIN SMEAR   Final   Culture   Final    NO GROWTH 3 DAYS Performed at [facility], [address] [place], [place] [address]    Report Status 01/02/2023 FINAL  Final  Culture, Fungus without Smear     Status: None (Preliminary result)   Collection Time: 12/30/22 12:50 PM   Specimen: PATH Cytology CSF; Cerebrospinal Fluid  Result Value Ref Range Status   Specimen Description CSF  Final   Special Requests NONE  Final   Culture   Final    NO GROWTH 4 DAYS Performed at [facility], [address], [place], [place] [address]    Report Status PENDING  Incomplete     Labs:  CBC: Recent Labs  Lab 12/29/22 1645 12/29/22 1701 12/29/22 1735 12/30/22 0144 12/31/22 0432 01/01/23 0329 01/02/23 0232  WBC 11.7*  --   --  15.1* 10.3 6.1 4.6  NEUTROABS 10.3*  --   --   --   --   --   --   HGB 14.6   < > 13.6 13.7 11.9* 12.7* 13.4  HCT 40.0   < > 40.0 37.6* 32.6* 35.0* 37.1*  MCV 87.3  --   --  84.3 84.0 84.5 84.5  PLT 165  --   --  167 142* 158 185   < > = values in this interval not displayed.   BMP &GFR Recent Labs  Lab 12/30/22 0144 12/30/22 2045 12/31/22 0140 01/01/23 0328 01/01/23 0329 01/02/23  7253 01/03/23 0114  NA 137  --  139  --  137 138 137  K 3.6  --  3.3*  --  3.1* 3.3* 3.6  CL 99  --  102  --  100 98 101  CO2 24  --  27  --  26 25 23   GLUCOSE 235*  --  131*  --  134* 161* 151*  BUN 11  --  13  --  10 10 13   CREATININE 1.12  --  1.40*  --  1.09 1.13 1.24  CALCIUM 8.8*  --  8.5*  --  8.4* 9.2 9.2  MG 1.3* 2.0 1.9 1.7  --  1.8 1.7  PHOS 3.6  --   --   --   --  3.3 3.2   Estimated Creatinine Clearance: 73.7 mL/min (by C-G formula based on SCr of 1.24 mg/dL). Liver & Pancreas: Recent Labs  Lab 12/29/22 1645 01/02/23 0232 01/03/23 0114  AST 33  --   --   ALT 41  --   --   ALKPHOS 41  --   --   BILITOT 1.2  --   --   PROT 6.8  --   --    ALBUMIN 4.0 3.4* 3.3*   No results for input(s): "LIPASE", "AMYLASE" in the last 168 hours. No results for input(s): "AMMONIA" in the last 168 hours. Diabetic: No results for input(s): "HGBA1C" in the last 72 hours. Recent Labs  Lab 01/02/23 1219 01/02/23 1715 01/02/23 2113 01/03/23 0649 01/03/23 1228  GLUCAP 165* 180* 178* 161* 160*   Cardiac Enzymes: No results for input(s): "CKTOTAL", "CKMB", "CKMBINDEX", "TROPONINI" in the last 168 hours. No results for input(s): "PROBNP" in the last 8760 hours. Coagulation Profile: Recent Labs  Lab 12/29/22 1645  INR 1.0   Thyroid Function Tests: No results for input(s): "TSH", "T4TOTAL", "FREET4", "T3FREE", "THYROIDAB" in the last 72 hours. Lipid Profile: No results for input(s): "CHOL", "HDL", "LDLCALC", "TRIG", "CHOLHDL", "LDLDIRECT" in the last 72 hours. Anemia Panel: No results for input(s): "VITAMINB12", "FOLATE", "FERRITIN", "TIBC", "IRON", "RETICCTPCT" in the last 72 hours. Urine analysis:    Component Value Date/Time   COLORURINE YELLOW 12/29/2022 1751   APPEARANCEUR CLEAR 12/29/2022 1751   LABSPEC 1.015 12/29/2022 1751   PHURINE 5.0 12/29/2022 1751   GLUCOSEU NEGATIVE 12/29/2022 1751   HGBUR NEGATIVE 12/29/2022 1751   BILIRUBINUR NEGATIVE 12/29/2022 1751   KETONESUR NEGATIVE 12/29/2022 1751   PROTEINUR NEGATIVE 12/29/2022 1751   NITRITE NEGATIVE 12/29/2022 1751   LEUKOCYTESUR NEGATIVE 12/29/2022 1751   Sepsis Labs: Invalid input(s): "PROCALCITONIN", "LACTICIDVEN"   SIGNED:  Almon Hercules, MD  [facility] 01/03/2023, 3:02 PM

## 2023-01-03 NOTE — TOC Transition Note (Signed)
Transition of Care [facility]) - CM/SW Discharge Note   Patient Details  Name: Ronald Rice MRN: [id number] Date of Birth: November 22, 1945  Transition of Care [facility]) CM/SW Contact:  Kermit Balo, RN Phone Number: 01/03/2023, 2:45 PM   Clinical Narrative:    Patient and family have decided for him to d/c  home.  Pt was active with [facility] prior to admission and spouse would like to continue with their services. Tresa Endo will [facility] aware of resumption orders.  Recommendations for rollator at home. Wife states pt will not use it he will only use his rollator.  Wife requesting transport home. Address verified and PTAR arranged.    Final next level of care: Home w Home Health Services Barriers to Discharge: No Barriers Identified   Patient Goals and CMS Choice CMS Medicare.gov Compare Post Acute Care list provided to:: Patient Represenative (must comment) Choice offered to / list presented to : Spouse  Discharge Placement                         Discharge Plan and Services Additional resources added to the After Visit Summary for                            [facility] Arranged: PT, OT, RN [facility] Agency: [facility] Date [facility] Agency Contacted: 01/03/23   Representative spoke with at [facility] Agency: Tresa Endo  Social Determinants of Health (SDOH) Interventions SDOH Screenings   Food Insecurity: Low Risk  (07/22/2022)   [facility]  Recent Concern: Food Insecurity - Medium Risk (04/29/2022)   [facility]  Transportation Needs: No Transportation Needs (07/22/2022)   [facility]  Recent Concern: Transportation Needs - Unmet Transportation Needs (04/29/2022)   [facility] visits prior to 07/24/2022.  Utilities: Low Risk  (07/22/2022)   [facility] from [facility] visits prior to 07/24/2022., [facility] visits  prior to 07/24/2022.  Tobacco Use: Medium Risk (12/29/2022)     Readmission Risk Interventions     No data to display

## 2023-01-03 NOTE — Progress Notes (Signed)
Physical Therapy Treatment  Patient Details Name: Ronald Rice MRN: [id number] DOB: [birth year] Today's Date: 01/03/2023   History of Present Illness 77 y.o. male admitted 8/7 with AMS, unresponsive and sz. 8/8 EEG with encephalopathy and LP negative for infection. PMhx: dementia, diffuse large cell non-Hodgkin's lymphoma, CLL, obesity, DM, HTN, HLD, CAD, diverticulosis and sleep apnea    PT Comments  Pt progressing towards physical therapy goals. Pt required less assistance this session however continues to fatigue quickly, with heavy reliance on RW for support. Pt continues to be at a high risk for falls and demonstrates decreased insight into safety and deficits. PT continues to recommend SNF level rehab at this time to maximize functional independence, decrease risk for falls, and decrease burden of care on wife. I did speak with the pt's wife x2 today regarding patient's current level of function. Pt's wife reports that she would prefer pt go to short term rehab but he is not agreeable. Educated both pt and wife on fall risk, need for assist, and benefits of continued post-acute therapy. Will continue to follow and progress as able per POC.    If plan is discharge home, recommend the following: A little help with walking and/or transfers;A little help with bathing/dressing/bathroom;Assistance with cooking/housework;Direct supervision/assist for medications management;Direct supervision/assist for financial management;Help with stairs or ramp for entrance;Assist for transportation;Supervision due to cognitive status   Can travel by private vehicle     No  Equipment Recommendations  Rolling walker (2 wheels)    Recommendations for Other Services       Precautions / Restrictions Precautions Precautions: Fall Precaution Comments: seizure Restrictions Weight Bearing Restrictions: No     Mobility  Bed Mobility Overal bed mobility: Needs Assistance Bed Mobility: Supine to Sit      Supine to sit: Min assist     General bed mobility comments: Increased time and effort. Assist for trunk elevation to full sitting position.    Transfers Overall transfer level: Needs assistance Equipment used: Rolling walker (2 wheels) Transfers: Sit to/from Stand Sit to Stand: Min assist, From elevated surface, +2 safety/equipment           General transfer comment: Min progressing to CGA with practice. Increased time and effort to power up to full stand with each attempt.    Ambulation/Gait Ambulation/Gait assistance: Min assist, +2 safety/equipment Gait Distance (Feet): 20 Feet (+10) Assistive device: Rolling walker (2 wheels) Gait Pattern/deviations: Step-through pattern, Decreased stride length, Trunk flexed, Wide base of support Gait velocity: Decreased Gait velocity interpretation: <1.31 ft/sec, indicative of household ambulator   General Gait Details: Heavy reliance on RW for support. Slow, effortful steps with significantly flexed trunk. Pt fatigued quickly, leaning against the wall on the L several times during rest breaks. Chair follow utilized.   Stairs             Wheelchair Mobility     Tilt Bed    Modified Rankin (Stroke Patients Only) Modified Rankin (Stroke Patients Only) Pre-Morbid Rankin Score: Moderate disability Modified Rankin: Moderately severe disability     Balance Overall balance assessment: Needs assistance Sitting-balance support: Feet supported Sitting balance-Leahy Scale: Fair     Standing balance support: Bilateral upper extremity supported, During functional activity Standing balance-Leahy Scale: Poor                              Cognition Arousal: Alert Behavior During Therapy: Flat affect Overall Cognitive Status: History of cognitive impairments -  at baseline Area of Impairment: Orientation, Safety/judgement, Awareness, Problem solving                 Orientation Level: Disoriented to,  Situation       Safety/Judgement: Decreased awareness of safety, Decreased awareness of deficits Awareness: Emergent Problem Solving: Slow processing, Requires verbal cues, Difficulty sequencing          Exercises      General Comments        Pertinent Vitals/Pain Pain Assessment Pain Assessment: Faces Faces Pain Scale: Hurts little more Pain Location: LLE and R shoulder Pain Descriptors / Indicators: Discomfort Pain Intervention(s): Limited activity within patient's tolerance, Monitored during session, Repositioned    Home Living                          Prior Function            PT Goals (current goals can now be found in the care plan section) Acute Rehab PT Goals Patient Stated Goal: Be able to return home PT Goal Formulation: With patient Time For Goal Achievement: 01/15/23 Potential to Achieve Goals: Good Progress towards PT goals: Progressing toward goals    Frequency    Min 1X/week      PT Plan      Co-evaluation              AM-PAC PT "6 Clicks" Mobility   Outcome Measure  Help needed turning from your back to your side while in a flat bed without using bedrails?: A Little Help needed moving from lying on your back to sitting on the side of a flat bed without using bedrails?: A Little Help needed moving to and from a bed to a chair (including a wheelchair)?: A Little Help needed standing up from a chair using your arms (e.g., wheelchair or bedside chair)?: A Little Help needed to walk in hospital room?: A Little Help needed climbing 3-5 steps with a railing? : A Lot 6 Click Score: 17    End of Session Equipment Utilized During Treatment: Gait belt Activity Tolerance: Patient tolerated treatment well Patient left: in chair;with call bell/phone within reach;with chair alarm set Nurse Communication: Mobility status PT Visit Diagnosis: Unsteadiness on feet (R26.81);Pain;Difficulty in walking, not elsewhere classified  (R26.2) Pain - Right/Left: Left Pain - part of body: Hip;Leg     Time: 4696-2952 PT Time Calculation (min) (ACUTE ONLY): 26 min  Charges:    $Gait Training: 23-37 mins PT General Charges $$ ACUTE PT VISIT: 1 Visit                     Conni Slipper, PT, DPT Acute Rehabilitation Services Secure Chat Preferred Office: [phone]    Marylynn Pearson 01/03/2023, 2:33 PM

## 2023-01-03 NOTE — Progress Notes (Signed)
PTAR here to pick up pt and transport home. IV in L AC removed, male purewick removed, pt's few items with cell phone and charger packed and sent with pt. Pt had called wife and had her on the phone when PTAR arrived, she is aware he is leaving now from the hospital.

## 2023-01-04 ENCOUNTER — Other Ambulatory Visit (HOSPITAL_COMMUNITY): Payer: Self-pay

## 2023-01-06 ENCOUNTER — Other Ambulatory Visit (HOSPITAL_COMMUNITY): Payer: Self-pay

## 2023-01-06 ENCOUNTER — Other Ambulatory Visit: Payer: Self-pay

## 2023-01-06 MED ORDER — MOUNJARO 2.5 MG/0.5ML ~~LOC~~ SOAJ
2.5000 mg | SUBCUTANEOUS | 0 refills | Status: DC
  Filled 2023-01-06 – 2023-12-12 (×2): qty 2, 28d supply, fill #0

## 2023-01-16 DIAGNOSIS — R0689 Other abnormalities of breathing: Secondary | ICD-10-CM | POA: Diagnosis not present

## 2023-01-16 DIAGNOSIS — R509 Fever, unspecified: Secondary | ICD-10-CM | POA: Diagnosis not present

## 2023-01-16 DIAGNOSIS — R531 Weakness: Secondary | ICD-10-CM | POA: Diagnosis not present

## 2023-01-16 DIAGNOSIS — R000 Tachycardia, unspecified: Secondary | ICD-10-CM | POA: Diagnosis not present

## 2023-01-16 DIAGNOSIS — R609 Edema, unspecified: Secondary | ICD-10-CM | POA: Diagnosis not present

## 2023-02-01 DIAGNOSIS — R531 Weakness: Secondary | ICD-10-CM | POA: Diagnosis not present

## 2023-02-01 DIAGNOSIS — R410 Disorientation, unspecified: Secondary | ICD-10-CM | POA: Diagnosis not present

## 2023-02-01 DIAGNOSIS — R609 Edema, unspecified: Secondary | ICD-10-CM | POA: Diagnosis not present

## 2023-02-02 ENCOUNTER — Other Ambulatory Visit (HOSPITAL_COMMUNITY): Payer: Self-pay

## 2023-02-07 ENCOUNTER — Inpatient Hospital Stay: Payer: HMO | Admitting: Hematology & Oncology

## 2023-02-07 ENCOUNTER — Other Ambulatory Visit (HOSPITAL_COMMUNITY): Payer: Self-pay

## 2023-02-07 ENCOUNTER — Inpatient Hospital Stay: Payer: HMO | Attending: Hematology & Oncology

## 2023-02-07 ENCOUNTER — Other Ambulatory Visit: Payer: Self-pay

## 2023-02-07 MED ORDER — METFORMIN HCL ER 500 MG PO TB24
1000.0000 mg | ORAL_TABLET | Freq: Two times a day (BID) | ORAL | 0 refills | Status: AC
  Filled 2023-02-07: qty 360, 90d supply, fill #0

## 2023-02-08 ENCOUNTER — Other Ambulatory Visit (HOSPITAL_COMMUNITY): Payer: Self-pay

## 2023-02-08 DIAGNOSIS — Z743 Need for continuous supervision: Secondary | ICD-10-CM | POA: Diagnosis not present

## 2023-02-08 DIAGNOSIS — R531 Weakness: Secondary | ICD-10-CM | POA: Diagnosis not present

## 2023-02-09 DIAGNOSIS — B965 Pseudomonas (aeruginosa) (mallei) (pseudomallei) as the cause of diseases classified elsewhere: Secondary | ICD-10-CM | POA: Diagnosis not present

## 2023-02-09 DIAGNOSIS — N390 Urinary tract infection, site not specified: Secondary | ICD-10-CM | POA: Diagnosis not present

## 2023-02-09 DIAGNOSIS — G929 Unspecified toxic encephalopathy: Secondary | ICD-10-CM | POA: Diagnosis not present

## 2023-02-09 DIAGNOSIS — T50905D Adverse effect of unspecified drugs, medicaments and biological substances, subsequent encounter: Secondary | ICD-10-CM | POA: Diagnosis not present

## 2023-03-31 DIAGNOSIS — G4733 Obstructive sleep apnea (adult) (pediatric): Secondary | ICD-10-CM | POA: Diagnosis not present

## 2023-04-15 ENCOUNTER — Other Ambulatory Visit (HOSPITAL_COMMUNITY): Payer: Self-pay

## 2023-06-07 ENCOUNTER — Other Ambulatory Visit (HOSPITAL_COMMUNITY): Payer: Self-pay

## 2023-06-09 ENCOUNTER — Telehealth: Payer: Self-pay | Admitting: Hematology & Oncology

## 2023-06-09 NOTE — Telephone Encounter (Signed)
Returning call from voicemail. LVM for pt to call back for scheduling.

## 2023-06-15 ENCOUNTER — Ambulatory Visit: Payer: HMO | Admitting: Physician Assistant

## 2023-06-17 ENCOUNTER — Telehealth: Payer: Self-pay | Admitting: Physician Assistant

## 2023-06-17 ENCOUNTER — Inpatient Hospital Stay: Payer: No Typology Code available for payment source | Admitting: Family

## 2023-06-17 ENCOUNTER — Inpatient Hospital Stay: Payer: No Typology Code available for payment source | Attending: Internal Medicine

## 2023-06-17 NOTE — Telephone Encounter (Signed)
Patient is needing to be seen the same day as his wife he is disbaled and his wife will need to be there with him and there appts need to be scheduled for the same the day she would like a call back regarding this matter

## 2023-06-29 ENCOUNTER — Ambulatory Visit: Payer: HMO | Admitting: Physician Assistant

## 2023-06-29 NOTE — Progress Notes (Deleted)
 New patient visit   Patient: Ronald Rice   DOB: 1945/11/22   78 y.o. Male  MRN: [id number] Visit Date: 06/29/2023  Today's healthcare provider: Manuelita Flatness, PA-C   No chief complaint on file.  Subjective    Jayme Cham is a 78 y.o. male with a PMH of CLL with transformation to nonhodgkins lymphoma, CVA, CAD, HTN, T2DM, OSA, baseline dementia, BPH, seizures, who presents today as a new patient to establish care.     Past Medical History:  Diagnosis Date   Cancer (HCC)    Leukemia    Cervical myelopathy (HCC)    Chronic sinusitis    CLL (chronic lymphocytic leukemia) (HCC)    see records in care everywhere from [facility]   Coronary artery disease    minimal nonobstructive by 10/31/16 cath at [facility] in [place]    Diabetes mellitus without complication (HCC)    Diffuse large B-cell lymphoma of lymph nodes of multiple regions (HCC) 10/31/2018   Diverticulosis    Erectile dysfunction    Fracture    left ankle fracture with retained hardware   GERD (gastroesophageal reflux disease)    Goals of care, counseling/discussion 10/31/2018   Gout    Hyperlipemia    Hypertension    Lumbar radiculopathy    Memory loss    OSA (obstructive sleep apnea)    Peripheral neuropathy    Renal cyst    Sleep apnea     CPAP   Thyroid  nodule    Wears dentures    Wears glasses    Past Surgical History:  Procedure Laterality Date   ANKLE RECONSTRUCTION Left 12/19/2018   Procedure: LEFT ANKLE REVISION FIXATION, REMOVAL OF HARDWARE, OPEN TREATMENT, SYNDESMOSIS, POSSIBLE ANKLE ARTHROTOMY, DELTOID LIGAMENT REPAIR;  Surgeon: Elsa Lonni SAUNDERS, MD;  Location: [facility] OR;  Service: Orthopedics;  Laterality: Left;   ANTERIOR CERVICAL DECOMP/DISCECTOMY FUSION N/A 04/20/2017   Procedure: ANTERIOR CERVICAL DECOMPRESSION FUSION, CERVICAL 3-4, CERVICAL 4-5 WITH INSTRUMENTATION AND ALLOGRAFT; REQUEST 3.5 HOURS;  Surgeon: Beuford Anes, MD;  Location: [facility] OR;  Service: Orthopedics;  Laterality: N/A;  ANTERIOR  CERVICAL DECOMPRESSION FUSION, CERVICAL 3-4, CERVICAL 4-5 WITH INSTRUMENTATION AND ALLOGRAFT; REQUEST 3.5 HOURS   BACK SURGERY     CARDIAC CATHETERIZATION     in Care Everywhere 11/01/16   COLONOSCOPY  10 years ago   in [place] -normal exam   IR IMAGING GUIDED PORT INSERTION  11/10/2018   IR RADIOLOGIST EVAL & MGMT  10/16/2019   IR RADIOLOGIST EVAL & MGMT  12/20/2019   IR RENAL CYST  11/28/2019   IR RENAL CYST  11/28/2019   IR RENAL CYST  11/28/2019   IR RENAL CYST  11/28/2019   LEG SURGERY Left 2019   from fracture   MULTIPLE TOOTH EXTRACTIONS     ORIF ANKLE FRACTURE Left 09/13/2018   Procedure: OPEN REDUCTION INTERNAL FIXATION (ORIF) LATERAL LEFT ANKLE FRACTURE;  Surgeon: Yvone Rush, MD;  Location: [facility] OR;  Service: Orthopedics;  Laterality: Left;   SYNDESMOSIS REPAIR Left 09/13/2018   Procedure: OPEN REDUCTION INTERNAL FIXATION SYNDESMOSIS REPAIR LEFT ANKLE;  Surgeon: Yvone Rush, MD;  Location: [facility] OR;  Service: Orthopedics;  Laterality: Left;   Family Status  Relation Name Status   Mother  Deceased   Father  Deceased   Sister  (Not Specified)   Brother  (Not Specified)   Neg Hx  (Not Specified)  No partnership data on file   Family History  Problem Relation Age of Onset   Heart attack Mother  Stroke Father    Diabetes Sister    Diabetes Brother    Colon cancer Neg Hx    Colon polyps Neg Hx    Esophageal cancer Neg Hx    Stomach cancer Neg Hx    Rectal cancer Neg Hx    Social History   Socioeconomic History   Marital status: Divorced    Spouse name: Not on file   Number of children: 2   Years of education: 14   Highest education level: Not on file  Occupational History   Occupation: Retired  Tobacco Use   Smoking status: Former    Current packs/day: 0.00    Average packs/day: 0.3 packs/day for 2.0 years (0.5 ttl pk-yrs)    Types: Pipe, Cigars, Cigarettes    Start date: 11/01/1987    Quit date: 2022    Years since quitting: 3.0   Smokeless tobacco: Never  Vaping  Use   Vaping status: Never Used  Substance and Sexual Activity   Alcohol  use: Yes    Comment: occasional wine and liquor-monthly   Drug use: No   Sexual activity: Yes  Other Topics Concern   Not on file  Social History Narrative   Lives with daughter.   Caffeine use: Drinks coffee daily (1-2 cups)   Right handed    Social Drivers of Corporate Investment Banker Strain: Not on file  Food Insecurity: Low Risk  (07/22/2022)   [facility]   Hunger Vital Sign    Worried About Running Out of Food in the Last Year: Never true    Within the past 12 months, the food you bought just didn't last and you didn't have money to get more: Not on file  Recent Concern: Food Insecurity - Medium Risk (04/29/2022)   [facility]   Hunger Vital Sign    Worried About Running Out of Food in the Last Year: Sometimes true    Within the past 12 months, the food you bought just didn't last and you didn't have money to get more: Not on file  Transportation Needs: No Transportation Needs (07/22/2022)   [facility]   Transportation    In the past 12 months, has lack of reliable transportation kept you from medical appointments, meetings, work or from getting things needed for daily living? : No  Recent Concern: Transportation Needs - Unmet Transportation Needs (04/29/2022)   [facility] prior to 07/24/2022.   Transportation    In the past 12 months, has lack of reliable transportation kept you from medical appointments, meetings, work or from getting things needed for daily living?: Yes  Physical Activity: Not on file  Stress: Not on file  Social Connections: Not on file   Outpatient Medications Prior to Visit  Medication Sig   acetaminophen  (TYLENOL ) 500 MG tablet Take 1,000 mg by mouth every 6 (six) hours as needed (pain).    allopurinol  (ZYLOPRIM ) 300 MG tablet Take 300 mg by  mouth daily.   amLODipine  (NORVASC ) 10 MG tablet Take 1 tablet (10 mg total) by mouth daily.   aspirin  EC 81 MG tablet Take 81 mg by mouth daily.   atenolol  (TENORMIN ) 25 MG tablet Take 1 tablet (25 mg total) by mouth daily.   atorvastatin  (LIPITOR) 40 MG tablet Take 1 tablet (40 mg total) by mouth daily.   Cholecalciferol (VITAMIN D3 PO) Take 1 tablet by mouth daily.  CINNAMON PO Take 1 tablet by mouth daily.   donepezil  (ARICEPT ) 5 MG tablet Take 1 tablet by mouth once daily.   levETIRAcetam  (KEPPRA ) 500 MG tablet Take 1 tablet (500 mg total) by mouth 2 (two) times daily.   metFORMIN  (GLUCOPHAGE -XR) 500 MG 24 hr tablet Take 2 tablets (1,000 mg total) by mouth 2 (two) times daily with food   pyridoxine (B-6) 250 MG tablet Take 250 mg by mouth daily.   tadalafil  (CIALIS ) 10 MG tablet Take 1 tablet (10 mg total) by mouth daily as needed.   tamsulosin  (FLOMAX ) 0.4 MG CAPS capsule Take 1 capsule (0.4 mg total) by mouth daily.   tirzepatide  (MOUNJARO ) 2.5 MG/0.5ML Pen Inject 2.5 mg into the skin every 7 (seven) days.   No facility-administered medications prior to visit.   No Known Allergies  Immunization History  Administered Date(s) Administered   Influenza Split 03/12/2006   Influenza, High Dose Seasonal PF 02/22/2014, 02/24/2015, 02/16/2016   Influenza, Seasonal, Injecte, Preservative Fre 02/29/2008, 02/27/2009, 02/26/2010, 02/27/2011, 03/02/2012   Influenza-Unspecified 03/01/1994, 02/22/1998, 04/04/2001, 03/27/2002, 06/25/2003, 03/22/2005, 02/19/2013, 04/22/2016   Moderna Covid-19 Fall Seasonal Vaccine 110yrs & older 03/15/2022   PFIZER Comirnaty(Gray Top)Covid-19 Tri-Sucrose Vaccine 07/28/2019, 08/18/2019, 01/19/2020   PFIZER(Purple Top)SARS-COV-2 Vaccination 01/18/2020   PPD Test 12/05/2021, 12/14/2021   Pneumococcal Conjugate-13 10/28/2014   Pneumococcal Polysaccharide-23 06/12/2002, 01/25/2013, 01/25/2017   Td 12/05/1998   Tdap 12/31/2008, 01/30/2021   Zoster  Recombinant(Shingrix) 03/13/2018, 03/20/2019   Zoster, Live 11/27/2012   Zoster, Unspecified 03/13/2018    Health Maintenance  Topic Date Due   FOOT EXAM  Never done   OPHTHALMOLOGY EXAM  Never done   Diabetic kidney evaluation - Urine ACR  Never done   Colonoscopy  Never done   Medicare Annual Wellness (AWV)  06/18/2020   INFLUENZA VACCINE  12/23/2022   COVID-19 Vaccine (6 - 2024-25 season) 01/23/2023   HEMOGLOBIN A1C  07/01/2023   Diabetic kidney evaluation - eGFR measurement  01/03/2024   DTaP/Tdap/Td (4 - Td or Tdap) 01/31/2031   Pneumonia Vaccine 93+ Years old  Completed   Hepatitis C Screening  Completed   Zoster Vaccines- Shingrix  Completed   HPV VACCINES  Aged Out    Patient Care Team: Clarice Nottingham, MD as PCP - General (Internal Medicine)  Review of Systems  {Insert previous labs (optional):23779} {See past labs  Heme  Chem  Endocrine  Serology  Results Review (optional):1}   Objective    There were no vitals taken for this visit. {Insert last BP/Wt (optional):23777}{See vitals history (optional):1}   Physical Exam ***  Depression Screen     No data to display         No results found for any visits on 06/29/23.  Assessment & Plan     There are no diagnoses linked to this encounter.   No follow-ups on file.      Manuelita Flatness, PA-C  [facility] [phone] (phone) [phone] (fax)  [facility]

## 2023-07-06 NOTE — Progress Notes (Deleted)
 New patient visit   Patient: Ronald Rice   DOB: February 13, 1946   78 y.o. Male  MRN: [id number] Visit Date: 07/07/2023  Today's healthcare provider: Alfredia Ferguson, PA-C   No chief complaint on file.  Subjective    Ronald Rice is a 78 y.o. male who presents today as a new patient to establish care.   ***  Past Medical History:  Diagnosis Date   Cancer (HCC)    Leukemia    Cervical myelopathy (HCC)    Chronic sinusitis    CLL (chronic lymphocytic leukemia) (HCC)    see records in care everywhere from [facility]   Coronary artery disease    minimal nonobstructive by 10/31/16 cath at [facility] in [place]   Diabetes mellitus without complication (HCC)    Diffuse large B-cell lymphoma of lymph nodes of multiple regions (HCC) 10/31/2018   Diverticulosis    Erectile dysfunction    Fracture    left ankle fracture with retained hardware   GERD (gastroesophageal reflux disease)    Goals of care, counseling/discussion 10/31/2018   Gout    Hyperlipemia    Hypertension    Lumbar radiculopathy    Memory loss    OSA (obstructive sleep apnea)    Peripheral neuropathy    Renal cyst    Sleep apnea     CPAP   Thyroid nodule    Wears dentures    Wears glasses    Past Surgical History:  Procedure Laterality Date   ANKLE RECONSTRUCTION Left 12/19/2018   Procedure: LEFT ANKLE REVISION FIXATION, REMOVAL OF HARDWARE, OPEN TREATMENT, SYNDESMOSIS, POSSIBLE ANKLE ARTHROTOMY, DELTOID LIGAMENT REPAIR;  Surgeon: Terance Hart, MD;  Location: [facility] OR;  Service: Orthopedics;  Laterality: Left;   ANTERIOR CERVICAL DECOMP/DISCECTOMY FUSION N/A 04/20/2017   Procedure: ANTERIOR CERVICAL DECOMPRESSION FUSION, CERVICAL 3-4, CERVICAL 4-5 WITH INSTRUMENTATION AND ALLOGRAFT; REQUEST 3.5 HOURS;  Surgeon: Estill Bamberg, MD;  Location: [facility] OR;  Service: Orthopedics;  Laterality: N/A;  ANTERIOR CERVICAL DECOMPRESSION FUSION, CERVICAL 3-4, CERVICAL 4-5 WITH INSTRUMENTATION AND ALLOGRAFT; REQUEST 3.5 HOURS   BACK  SURGERY     CARDIAC CATHETERIZATION     in Care Everywhere 11/01/16   COLONOSCOPY  10 years ago   in [place]-normal exam   IR IMAGING GUIDED PORT INSERTION  11/10/2018   IR RADIOLOGIST EVAL & MGMT  10/16/2019   IR RADIOLOGIST EVAL & MGMT  12/20/2019   IR RENAL CYST  11/28/2019   IR RENAL CYST  11/28/2019   IR RENAL CYST  11/28/2019   IR RENAL CYST  11/28/2019   LEG SURGERY Left 2019   from fracture   MULTIPLE TOOTH EXTRACTIONS     ORIF ANKLE FRACTURE Left 09/13/2018   Procedure: OPEN REDUCTION INTERNAL FIXATION (ORIF) LATERAL LEFT ANKLE FRACTURE;  Surgeon: Jodi Geralds, MD;  Location: [facility] OR;  Service: Orthopedics;  Laterality: Left;   SYNDESMOSIS REPAIR Left 09/13/2018   Procedure: OPEN REDUCTION INTERNAL FIXATION SYNDESMOSIS REPAIR LEFT ANKLE;  Surgeon: Jodi Geralds, MD;  Location: [facility] OR;  Service: Orthopedics;  Laterality: Left;   Family Status  Relation Name Status   Mother  Deceased   Father  Deceased   Sister  (Not Specified)   Brother  (Not Specified)   Neg Hx  (Not Specified)  No partnership data on file   Family History  Problem Relation Age of Onset   Heart attack Mother    Stroke Father    Diabetes Sister    Diabetes Brother    Colon cancer Neg  Hx    Colon polyps Neg Hx    Esophageal cancer Neg Hx    Stomach cancer Neg Hx    Rectal cancer Neg Hx    Social History   Socioeconomic History   Marital status: Divorced    Spouse name: Not on file   Number of children: 2   Years of education: 14   Highest education level: Not on file  Occupational History   Occupation: Retired  Tobacco Use   Smoking status: Former    Current packs/day: 0.00    Average packs/day: 0.3 packs/day for 2.0 years (0.5 ttl pk-yrs)    Types: Pipe, Cigars, Cigarettes    Start date: 11/01/1987    Quit date: 2022    Years since quitting: 3.1   Smokeless tobacco: Never  Vaping Use   Vaping status: Never Used  Substance and Sexual Activity   Alcohol use: Yes    Comment: occasional wine and  liquor-monthly   Drug use: No   Sexual activity: Yes  Other Topics Concern   Not on file  Social History Narrative   Lives with daughter.   Caffeine use: Drinks coffee daily (1-2 cups)   Right handed    Social Drivers of Corporate investment banker Strain: Not on file  Food Insecurity: Low Risk  (07/22/2022)   [facility]   Hunger Vital Sign    Worried About Running Out of Food in the Last Year: Never true    Within the past 12 months, the food you bought just didn't last and you didn't have money to get more: Not on file  Recent Concern: Food Insecurity - Medium Risk (04/29/2022)   [facility]   Hunger Vital Sign    Worried About Running Out of Food in the Last Year: Sometimes true    Within the past 12 months, the food you bought just didn't last and you didn't have money to get more: Not on file  Transportation Needs: No Transportation Needs (07/22/2022)   [facility]   Transportation    In the past 12 months, has lack of reliable transportation kept you from medical appointments, meetings, work or from getting things needed for daily living? : No  Recent Concern: Transportation Needs - Unmet Transportation Needs (04/29/2022)   Received from [facility] visits prior to 07/24/2022.   Transportation    In the past 12 months, has lack of reliable transportation kept you from medical appointments, meetings, work or from getting things needed for daily living?: Yes  Physical Activity: Not on file  Stress: Not on file  Social Connections: Not on file   Outpatient Medications Prior to Visit  Medication Sig   acetaminophen (TYLENOL) 500 MG tablet Take 1,000 mg by mouth every 6 (six) hours as needed (pain).    allopurinol (ZYLOPRIM) 300 MG tablet Take 300 mg by mouth daily.   amLODipine (NORVASC) 10 MG tablet Take 1 tablet (10 mg total) by mouth daily.   aspirin EC 81 MG  tablet Take 81 mg by mouth daily.   atenolol (TENORMIN) 25 MG tablet Take 1 tablet (25 mg total) by mouth daily.   atorvastatin (LIPITOR) 40 MG tablet Take 1 tablet (40 mg total) by mouth daily.   Cholecalciferol (VITAMIN D3 PO) Take 1 tablet by mouth daily.   CINNAMON PO Take 1 tablet by mouth daily.   donepezil (ARICEPT) 5 MG tablet Take 1  tablet by mouth once daily.   levETIRAcetam (KEPPRA) 500 MG tablet Take 1 tablet (500 mg total) by mouth 2 (two) times daily.   metFORMIN (GLUCOPHAGE-XR) 500 MG 24 hr tablet Take 2 tablets (1,000 mg total) by mouth 2 (two) times daily with food   pyridoxine (B-6) 250 MG tablet Take 250 mg by mouth daily.   tadalafil (CIALIS) 10 MG tablet Take 1 tablet (10 mg total) by mouth daily as needed.   tamsulosin (FLOMAX) 0.4 MG CAPS capsule Take 1 capsule (0.4 mg total) by mouth daily.   tirzepatide [facility]) 2.5 MG/0.5ML Pen Inject 2.5 mg into the skin every 7 (seven) days.   No facility-administered medications prior to visit.   No Known Allergies  Immunization History  Administered Date(s) Administered   Influenza Split 03/12/2006   Influenza, High Dose Seasonal PF 02/22/2014, 02/24/2015, 02/16/2016   Influenza, Seasonal, Injecte, Preservative Fre 02/29/2008, 02/27/2009, 02/26/2010, 02/27/2011, 03/02/2012   Influenza-Unspecified 03/01/1994, 02/22/1998, 04/04/2001, 03/27/2002, 06/25/2003, 03/22/2005, 02/19/2013, 04/22/2016   Moderna Covid-19 Fall Seasonal Vaccine 65yrs & older 03/15/2022   PFIZER Comirnaty(Gray Top)Covid-19 Tri-Sucrose Vaccine 07/28/2019, 08/18/2019, 01/19/2020   PFIZER(Purple Top)SARS-COV-2 Vaccination 01/18/2020   PPD Test 12/05/2021, 12/14/2021   Pneumococcal Conjugate-13 10/28/2014   Pneumococcal Polysaccharide-23 06/12/2002, 01/25/2013, 01/25/2017   Td 12/05/1998   Tdap 12/31/2008, 01/30/2021   Zoster Recombinant(Shingrix) 03/13/2018, 03/20/2019   Zoster, Live 11/27/2012   Zoster, Unspecified 03/13/2018    Health Maintenance   Topic Date Due   FOOT EXAM  Never done   OPHTHALMOLOGY EXAM  Never done   Diabetic kidney evaluation - Urine ACR  Never done   Colonoscopy  Never done   Medicare Annual Wellness (AWV)  06/18/2020   INFLUENZA VACCINE  12/23/2022   COVID-19 Vaccine (6 - 2024-25 season) 01/23/2023   HEMOGLOBIN A1C  07/01/2023   Diabetic kidney evaluation - eGFR measurement  01/03/2024   DTaP/Tdap/Td (4 - Td or Tdap) 01/31/2031   Pneumonia Vaccine 74+ Years old  Completed   Hepatitis C Screening  Completed   Zoster Vaccines- Shingrix  Completed   HPV VACCINES  Aged Out    Patient Care Team: Merri Brunette, MD as PCP - General (Internal Medicine)  Review of Systems  {Insert previous labs (optional):23779} {See past labs  Heme  Chem  Endocrine  Serology  Results Review (optional):1}   Objective    There were no vitals taken for this visit. {Insert last BP/Wt (optional):23777}{See vitals history (optional):1}   Physical Exam ***  Depression Screen     No data to display         No results found for any visits on 07/07/23.  Assessment & Plan     There are no diagnoses linked to this encounter.   No follow-ups on file.      Alfredia Ferguson, PA-C  [facility] [phone] (phone) [phone] (fax)  [facility]

## 2023-07-07 ENCOUNTER — Telehealth: Payer: Self-pay | Admitting: Internal Medicine

## 2023-07-07 ENCOUNTER — Ambulatory Visit: Payer: HMO | Admitting: Physician Assistant

## 2023-07-07 NOTE — Telephone Encounter (Signed)
Copied from CRM [id number]. Topic: Appointments - Scheduling Inquiry for Clinic >> Jul 07, 2023 12:01 PM Fonda Kinder J wrote: Reason for CRM: Pts notes says he cannot be rescheduled if he misses his next appointment but the wife wants to cancel for today because she has the flu and won't be able to being him. She wants to know if this could be reconsidered and would like a callback to reschedule if so   Callback # [phone]

## 2023-07-08 NOTE — Telephone Encounter (Signed)
Patient called again today, asking if something could be changed and if we could still see him... I let them know we would send to front office manager to see what next steps were.

## 2023-07-11 ENCOUNTER — Inpatient Hospital Stay: Payer: No Typology Code available for payment source | Admitting: Medical Oncology

## 2023-07-11 ENCOUNTER — Inpatient Hospital Stay: Payer: No Typology Code available for payment source | Attending: Internal Medicine

## 2023-07-11 DIAGNOSIS — Z452 Encounter for adjustment and management of vascular access device: Secondary | ICD-10-CM | POA: Insufficient documentation

## 2023-07-11 DIAGNOSIS — C9112 Chronic lymphocytic leukemia of B-cell type in relapse: Secondary | ICD-10-CM | POA: Insufficient documentation

## 2023-07-12 ENCOUNTER — Encounter: Payer: HMO | Admitting: Physician Assistant

## 2023-07-12 NOTE — Progress Notes (Signed)
Patient had messaged sent yesterday with phone number to call to have assistance with establishing with a new PCP.  Did not show for appointment with [facility]. Suspect scheduled in error.   This encounter was created in error - please disregard.

## 2023-07-19 NOTE — Telephone Encounter (Signed)
 LMOVM making pt aware that we are unable to schedule a 4th NP appt due to canceling the first 3. Pt was made aware if he canceled the 3rd appt we would not be able to reschedule at this location.

## 2023-07-20 ENCOUNTER — Inpatient Hospital Stay: Payer: No Typology Code available for payment source

## 2023-07-20 ENCOUNTER — Inpatient Hospital Stay: Payer: No Typology Code available for payment source | Admitting: Medical Oncology

## 2023-07-20 VITALS — BP 112/70 | HR 84 | Temp 98.2°F | Resp 18 | Wt 245.0 lb

## 2023-07-20 DIAGNOSIS — Z452 Encounter for adjustment and management of vascular access device: Secondary | ICD-10-CM | POA: Diagnosis not present

## 2023-07-20 DIAGNOSIS — C8338 Diffuse large B-cell lymphoma, lymph nodes of multiple sites: Secondary | ICD-10-CM

## 2023-07-20 DIAGNOSIS — C9110 Chronic lymphocytic leukemia of B-cell type not having achieved remission: Secondary | ICD-10-CM

## 2023-07-20 DIAGNOSIS — Z95828 Presence of other vascular implants and grafts: Secondary | ICD-10-CM

## 2023-07-20 DIAGNOSIS — C9112 Chronic lymphocytic leukemia of B-cell type in relapse: Secondary | ICD-10-CM | POA: Diagnosis not present

## 2023-07-20 LAB — SAVE SMEAR(SSMR), FOR PROVIDER SLIDE REVIEW

## 2023-07-20 LAB — CMP (CANCER CENTER ONLY)
ALT: 25 U/L (ref 0–44)
AST: 17 U/L (ref 15–41)
Albumin: 4.8 g/dL (ref 3.5–5.0)
Alkaline Phosphatase: 47 U/L (ref 38–126)
Anion gap: 11 (ref 5–15)
BUN: 17 mg/dL (ref 8–23)
CO2: 29 mmol/L (ref 22–32)
Calcium: 10.2 mg/dL (ref 8.9–10.3)
Chloride: 101 mmol/L (ref 98–111)
Creatinine: 1.25 mg/dL — ABNORMAL HIGH (ref 0.61–1.24)
GFR, Estimated: >60 mL/min (ref >60)
Glucose, Bld: 126 mg/dL — ABNORMAL HIGH (ref 70–99)
Potassium: 3.9 mmol/L (ref 3.5–5.1)
Sodium: 141 mmol/L (ref 135–145)
Total Bilirubin: 0.8 mg/dL (ref 0.0–1.2)
Total Protein: 6.7 g/dL (ref 6.5–8.1)

## 2023-07-20 LAB — CBC WITH DIFFERENTIAL (CANCER CENTER ONLY)
Abs Immature Granulocytes: 0.02 10*3/uL (ref 0.00–0.07)
Basophils Absolute: 0 10*3/uL (ref 0.0–0.1)
Basophils Relative: 1 %
Eosinophils Absolute: 0.1 10*3/uL (ref 0.0–0.5)
Eosinophils Relative: 1 %
HCT: 38 % — ABNORMAL LOW (ref 39.0–52.0)
Hemoglobin: 14 g/dL (ref 13.0–17.0)
Immature Granulocytes: 0 %
Lymphocytes Relative: 29 %
Lymphs Abs: 1.5 10*3/uL (ref 0.7–4.0)
MCH: 31.3 pg (ref 26.0–34.0)
MCHC: 36.8 g/dL — ABNORMAL HIGH (ref 30.0–36.0)
MCV: 85 fL (ref 80.0–100.0)
Monocytes Absolute: 0.6 10*3/uL (ref 0.1–1.0)
Monocytes Relative: 11 %
Neutro Abs: 2.9 10*3/uL (ref 1.7–7.7)
Neutrophils Relative %: 58 %
Platelet Count: 220 10*3/uL (ref 150–400)
RBC: 4.47 MIL/uL (ref 4.22–5.81)
RDW: 12.8 % (ref 11.5–15.5)
WBC Count: 5.1 10*3/uL (ref 4.0–10.5)
nRBC: 0 % (ref 0.0–0.2)

## 2023-07-20 LAB — LACTATE DEHYDROGENASE: LDH: 110 U/L (ref 98–192)

## 2023-07-20 MED ORDER — HEPARIN SOD (PORK) LOCK FLUSH 100 UNIT/ML IV SOLN
500.0000 [IU] | Freq: Once | INTRAVENOUS | Status: AC
  Administered 2023-07-20 (×2): 500 [IU] via INTRAVENOUS

## 2023-07-20 MED ORDER — SODIUM CHLORIDE 0.9% FLUSH
10.0000 mL | Freq: Once | INTRAVENOUS | Status: AC
  Administered 2023-07-20: 10 mL via INTRAVENOUS

## 2023-07-20 NOTE — Progress Notes (Signed)
 Hematology and Oncology Follow Up Visit  Ronald Rice [id number] 1945-10-23 78 y.o. 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 We last saw him on 10/05/2022  Unfortunately since this visit he has been in and out of the hospital many times for various reasons. He was hospitalized on 12/29/2022 for altered mental status/seizure like activity of unknown cause. He then was hospitalized on 01/16/2023 for sepsis, altered mental status, cellulitis. He was then hospitalized on 02/01/2023 for a complicated UTI with altered mental status.   Fortunately they state that he has been well recently. They continue to work with his PCP, neurologist in regards to his other chronic illnesses. Of note he is establishing care with a new PCP next week.   He was previously seen by home PT for strengthening and conditioning but has now been released to outpatient PT.   His last PET scan was on 07/16/2022.   He comes in a wheelchair.  He still has some issues with strengthening and mobility.  He has had no problems with bowels or bladder.  Sometimes he has little bit of incontinence because he cannot get to the bathroom fast enough.  He has had no recent fever.  There has been no bleeding to his knowledge: denies epistaxis, gingivitis, hemoptysis, hematemesis, hematuria, melena, excessive bruising, blood donation.    He has had no cough or shortness of breath.  Overall, I would say his performance status is probably ECOG 2.    Medications:  Allergies as of 07/20/2023   No Known Allergies      Medication List         Accurate as of July 20, 2023  3:31 PM. If you have any questions, ask your nurse or doctor.          acetaminophen 500 MG tablet Commonly known as: TYLENOL Take 1,000 mg by mouth every 6 (six) hours as needed (pain).   allopurinol 300 MG tablet Commonly known as: ZYLOPRIM Take 300 mg by mouth daily.   amLODipine 10 MG tablet Commonly known as: NORVASC Take 1 tablet (10 mg total) by mouth daily.   aspirin EC 81 MG tablet Take 81 mg by mouth daily.   atenolol 25 MG tablet Commonly known as: TENORMIN Take 1 tablet (25 mg total) by mouth daily.   atorvastatin 40 MG tablet Commonly known as: LIPITOR Take 1 tablet (40 mg total) by mouth daily.   CINNAMON PO Take 1 tablet by mouth daily.   donepezil 5 MG tablet Commonly known as: ARICEPT Take 1 tablet by mouth once daily.   levETIRAcetam 500 MG tablet Commonly known as: KEPPRA Take 1 tablet (500 mg total) by mouth 2 (two) times daily.   lidocaine-prilocaine cream Commonly known as: EMLA Apply 1 Application topically as needed.   metFORMIN 500 MG 24 hr tablet Commonly known as: GLUCOPHAGE-XR Take 2 tablets (1,000 mg total) by mouth 2 (two) times daily with food   Mounjaro 2.5 MG/0.5ML Pen Generic drug: tirzepatide Inject 2.5 mg into the skin every 7 (seven) days.   pyridoxine 250 MG tablet Commonly known as: B-6  Take 250 mg by mouth daily.   tadalafil 10 MG tablet Commonly known as: CIALIS Take 1 tablet (10 mg total) by mouth daily as needed.   tamsulosin 0.4 MG Caps capsule Commonly known as: FLOMAX Take 1 capsule (0.4 mg total) by mouth daily.   VITAMIN D3 PO Take 1 tablet by mouth daily.        Allergies: No Known Allergies  Past Medical History, Surgical history, Social history, and Family History were reviewed and updated.  Review of Systems: Review of Systems  Constitutional: Negative.   HENT: Negative.    Eyes: Negative.   Respiratory: Negative.    Gastrointestinal: Negative.    Genitourinary: Negative.   Musculoskeletal:  Positive for joint pain and myalgias.  Skin: Negative.   Neurological: Negative.   Endo/Heme/Allergies: Negative.   Psychiatric/Behavioral: Negative.       Physical Exam:  weight is 245 lb (111.1 kg). His oral temperature is 98.2 F (36.8 C). His blood pressure is 112/70 and his pulse is 84. His respiration is 18 and oxygen saturation is 98%.   Wt Readings from Last 3 Encounters:  07/20/23 245 lb (111.1 kg)  12/31/22 (!) 301 lb 5.9 oz (136.7 kg)  10/05/22 294 lb (133.4 kg)  Physical Exam Vitals reviewed.  HENT:     Head: Normocephalic and atraumatic.  Eyes:     Pupils: Pupils are equal, round, and reactive to light.  Cardiovascular:     Rate and Rhythm: Normal rate and regular rhythm.     Heart sounds: Normal heart sounds.  Pulmonary:     Effort: Pulmonary effort is normal.     Breath sounds: Normal breath sounds.  Abdominal:     General: Bowel sounds are normal.     Palpations: Abdomen is soft.  Musculoskeletal:        General: No tenderness or deformity. Normal range of motion.     Cervical back: Normal range of motion.  Lymphadenopathy:     Cervical: No cervical adenopathy.  Skin:    General: Skin is warm and dry.     Findings: No erythema or rash.  Neurological:     Mental Status: He is alert and oriented to person, place, and time.  Psychiatric:        Behavior: Behavior normal.        Thought Content: Thought content normal.        Judgment: Judgment normal.      Physical Activity: Not on file   Lab Results  Component Value Date   WBC 5.1 07/20/2023   HGB 14.0 07/20/2023   HCT 38.0 (L) 07/20/2023   MCV 85.0 07/20/2023   PLT 220 07/20/2023   No results found for: "FERRITIN", "IRON", "TIBC", "UIBC", "IRONPCTSAT" Lab Results  Component Value Date   RBC 4.47 07/20/2023   No results found for: "KPAFRELGTCHN", "LAMBDASER", "KAPLAMBRATIO" Lab Results  Component Value Date   IGGSERUM 466 (L) 02/04/2022    IGA 497 (H) 02/04/2022   IGMSERUM 46 02/04/2022   No results found for: "TOTALPROTELP", "ALBUMINELP", "A1GS", "A2GS", "BETS", "BETA2SER", "GAMS", "MSPIKE", "SPEI"   Chemistry      Component Value Date/Time   NA 141 07/20/2023 1415   NA 142 01/25/2017 1217   K 3.9 07/20/2023 1415   K 4.1 01/25/2017 1217   CL 101 07/20/2023 1415   CL 102 01/25/2017 1217   CO2 29 07/20/2023 1415   CO2 33 01/25/2017 1217   BUN 17 07/20/2023 1415   BUN 12 01/25/2017 1217  CREATININE 1.25 (H) 07/20/2023 1415   CREATININE 1.4 (H) 01/25/2017 1217      Component Value Date/Time   CALCIUM 10.2 07/20/2023 1415   CALCIUM 9.7 01/25/2017 1217   ALKPHOS 47 07/20/2023 1415   ALKPHOS 59 01/25/2017 1217   AST 17 07/20/2023 1415   ALT 25 07/20/2023 1415   ALT 32 01/25/2017 1217   BILITOT 0.8 07/20/2023 1415     Encounter Diagnoses  Name Primary?   Diffuse large B-cell lymphoma of lymph nodes of multiple regions (HCC)    Chronic lymphoid leukemia (HCC) Yes   Port-A-Cath in place    Impression and Plan: Mr. Grow is a very pleasant 78 yo [place] American gentleman with history of CLL and then transformed to a large cell non-Hodgkin's lymphoma. He was treated with radiation for bulky disease in the neck. He completed chemo back in December 2020 followed by radiation in February 2021. He subsequently now has recurrent CLL.  We had him on acalabrutinib.  This has been on hold for 9 months due to poor performance status and recurrent infections.   He is due for a repeat PET scan. I have ordered this today  Today his CBC is reassuring.   PET scan order placed- to be scheduled within 1 month  RTC 2 months port flush  RTC 4 months port flush RTC 6 months MD, port labs (CBC w/, CMP, LDH, save smear)   Rushie Chestnut, PA-C 2/26/20253:31 PM

## 2023-07-20 NOTE — Patient Instructions (Signed)

## 2023-08-04 ENCOUNTER — Encounter (HOSPITAL_COMMUNITY): Payer: HMO

## 2023-08-12 ENCOUNTER — Encounter (HOSPITAL_COMMUNITY)

## 2023-08-15 DIAGNOSIS — W19XXXA Unspecified fall, initial encounter: Secondary | ICD-10-CM | POA: Diagnosis not present

## 2023-08-15 DIAGNOSIS — R410 Disorientation, unspecified: Secondary | ICD-10-CM | POA: Diagnosis not present

## 2023-08-18 ENCOUNTER — Encounter (HOSPITAL_COMMUNITY)

## 2023-08-22 ENCOUNTER — Other Ambulatory Visit (HOSPITAL_COMMUNITY): Payer: Self-pay

## 2023-08-22 ENCOUNTER — Other Ambulatory Visit: Payer: Self-pay

## 2023-08-30 DIAGNOSIS — N401 Enlarged prostate with lower urinary tract symptoms: Secondary | ICD-10-CM | POA: Diagnosis not present

## 2023-08-30 DIAGNOSIS — N138 Other obstructive and reflux uropathy: Secondary | ICD-10-CM | POA: Diagnosis not present

## 2023-09-05 ENCOUNTER — Encounter (HOSPITAL_COMMUNITY)
Admission: RE | Admit: 2023-09-05 | Discharge: 2023-09-05 | Disposition: A | Discharge status: Home / Self Care | Source: Ambulatory Visit | Attending: Medical Oncology | Admitting: Medical Oncology

## 2023-09-05 DIAGNOSIS — C8338 Diffuse large B-cell lymphoma, lymph nodes of multiple sites: Secondary | ICD-10-CM | POA: Insufficient documentation

## 2023-09-05 DIAGNOSIS — C8591 Non-Hodgkin lymphoma, unspecified, lymph nodes of head, face, and neck: Secondary | ICD-10-CM | POA: Diagnosis not present

## 2023-09-05 DIAGNOSIS — C9110 Chronic lymphocytic leukemia of B-cell type not having achieved remission: Secondary | ICD-10-CM | POA: Diagnosis not present

## 2023-09-05 LAB — GLUCOSE, CAPILLARY: Glucose-Capillary: 143 mg/dL — ABNORMAL HIGH (ref 70–99)

## 2023-09-05 MED ORDER — FLUDEOXYGLUCOSE F - 18 (FDG) INJECTION
12.5700 | Freq: Once | INTRAVENOUS | Status: AC | PRN
  Administered 2023-09-05: 12.57 via INTRAVENOUS

## 2023-09-19 ENCOUNTER — Inpatient Hospital Stay: Payer: No Typology Code available for payment source

## 2023-09-19 ENCOUNTER — Telehealth: Payer: Self-pay | Admitting: *Deleted

## 2023-09-19 NOTE — Telephone Encounter (Signed)
 As noted below by Dr. Maria Shiner, I informed the patient that the PET scan still looks OK without any evidence of recurrent Lymphoma. He verbalized understanding.

## 2023-09-19 NOTE — Telephone Encounter (Signed)
-----   Message from Ivor Mars sent at 09/19/2023  4:37 PM EDT ----- Please call let him know that the PET scan is still looks okay without any evidence of recurrent lymphoma.

## 2023-09-27 ENCOUNTER — Inpatient Hospital Stay: Attending: Internal Medicine

## 2023-09-27 ENCOUNTER — Other Ambulatory Visit: Payer: Self-pay | Admitting: *Deleted

## 2023-09-27 VITALS — BP 124/80 | HR 74 | Temp 98.2°F | Resp 18

## 2023-09-27 DIAGNOSIS — C9112 Chronic lymphocytic leukemia of B-cell type in relapse: Secondary | ICD-10-CM | POA: Insufficient documentation

## 2023-09-27 DIAGNOSIS — Z452 Encounter for adjustment and management of vascular access device: Secondary | ICD-10-CM | POA: Insufficient documentation

## 2023-09-27 DIAGNOSIS — C9110 Chronic lymphocytic leukemia of B-cell type not having achieved remission: Secondary | ICD-10-CM

## 2023-09-27 DIAGNOSIS — Z95828 Presence of other vascular implants and grafts: Secondary | ICD-10-CM

## 2023-09-27 DIAGNOSIS — C8338 Diffuse large B-cell lymphoma, lymph nodes of multiple sites: Secondary | ICD-10-CM

## 2023-09-27 DIAGNOSIS — R600 Localized edema: Secondary | ICD-10-CM

## 2023-09-27 MED ORDER — HEPARIN SOD (PORK) LOCK FLUSH 100 UNIT/ML IV SOLN
500.0000 [IU] | Freq: Once | INTRAVENOUS | Status: AC
  Administered 2023-09-27: 500 [IU] via INTRAVENOUS

## 2023-09-27 MED ORDER — LIDOCAINE-PRILOCAINE 2.5-2.5 % EX CREA: 1.0000 | TOPICAL_CREAM | CUTANEOUS | 2 refills | Status: AC | PRN

## 2023-09-27 MED ORDER — SODIUM CHLORIDE 0.9% FLUSH
10.0000 mL | INTRAVENOUS | Status: DC | PRN
  Administered 2023-09-27: 10 mL via INTRAVENOUS

## 2023-10-05 NOTE — Progress Notes (Signed)
 IT has advised me to sign this chart so it can be closed. I did not have anything to do with this patient at time of signing [facility] 10/05/2023 1029

## 2023-10-07 ENCOUNTER — Telehealth: Payer: Self-pay

## 2023-10-07 NOTE — Telephone Encounter (Signed)
 Notified patient of prior authorization approval for Lidocaine -Prilocaine  2.5% Cream (EMLA ). Medication is approved through 01/05/2024. Pharmacy notified. No other needs or concerns noted at this time.

## 2023-10-25 DIAGNOSIS — E559 Vitamin D deficiency, unspecified: Secondary | ICD-10-CM | POA: Diagnosis not present

## 2023-10-25 DIAGNOSIS — M25511 Pain in right shoulder: Secondary | ICD-10-CM | POA: Diagnosis not present

## 2023-10-25 DIAGNOSIS — L918 Other hypertrophic disorders of the skin: Secondary | ICD-10-CM | POA: Diagnosis not present

## 2023-10-25 DIAGNOSIS — E66811 Obesity, class 1: Secondary | ICD-10-CM | POA: Diagnosis not present

## 2023-10-25 DIAGNOSIS — Z7689 Persons encountering health services in other specified circumstances: Secondary | ICD-10-CM | POA: Diagnosis not present

## 2023-10-25 DIAGNOSIS — B360 Pityriasis versicolor: Secondary | ICD-10-CM | POA: Diagnosis not present

## 2023-10-25 DIAGNOSIS — Z6834 Body mass index (BMI) 34.0-34.9, adult: Secondary | ICD-10-CM | POA: Diagnosis not present

## 2023-10-25 DIAGNOSIS — G8929 Other chronic pain: Secondary | ICD-10-CM | POA: Diagnosis not present

## 2023-10-25 DIAGNOSIS — G629 Polyneuropathy, unspecified: Secondary | ICD-10-CM | POA: Diagnosis not present

## 2023-10-25 DIAGNOSIS — E1165 Type 2 diabetes mellitus with hyperglycemia: Secondary | ICD-10-CM | POA: Diagnosis not present

## 2023-10-25 DIAGNOSIS — R5383 Other fatigue: Secondary | ICD-10-CM | POA: Diagnosis not present

## 2023-11-18 ENCOUNTER — Inpatient Hospital Stay: Payer: No Typology Code available for payment source

## 2023-11-22 ENCOUNTER — Inpatient Hospital Stay

## 2023-11-23 ENCOUNTER — Inpatient Hospital Stay: Attending: Internal Medicine

## 2023-11-23 DIAGNOSIS — Z452 Encounter for adjustment and management of vascular access device: Secondary | ICD-10-CM | POA: Insufficient documentation

## 2023-11-23 DIAGNOSIS — Z856 Personal history of leukemia: Secondary | ICD-10-CM

## 2023-11-23 DIAGNOSIS — C9112 Chronic lymphocytic leukemia of B-cell type in relapse: Secondary | ICD-10-CM | POA: Insufficient documentation

## 2023-11-23 MED ORDER — SODIUM CHLORIDE 0.9% FLUSH
10.0000 mL | INTRAVENOUS | Status: DC | PRN
  Administered 2023-11-23: 10 mL via INTRAVENOUS

## 2023-11-23 MED ORDER — HEPARIN SOD (PORK) LOCK FLUSH 100 UNIT/ML IV SOLN
500.0000 [IU] | Freq: Once | INTRAVENOUS | Status: AC
  Administered 2023-11-23: 500 [IU] via INTRAVENOUS

## 2023-11-23 NOTE — Patient Instructions (Signed)
 Return for August appt. to have port flushed.

## 2023-12-09 ENCOUNTER — Other Ambulatory Visit (HOSPITAL_COMMUNITY): Payer: Self-pay

## 2023-12-12 ENCOUNTER — Other Ambulatory Visit: Payer: Self-pay

## 2023-12-12 ENCOUNTER — Other Ambulatory Visit (HOSPITAL_COMMUNITY): Payer: Self-pay

## 2023-12-12 NOTE — Telephone Encounter (Signed)
 [name]/wife is calling on behalf of patient's mediation Mounjaro .  Please send prescription to a different pharmacy.  Pharmacy and location:  [facility] [place], [place] - [place]. [facility] AT [address] - PHONE: [phone] - FAX: [phone]   Please advise.  Call back number:  [phone] Ok to leave message on call back number

## 2023-12-13 NOTE — Telephone Encounter (Signed)
 Pt's wife, [name] (on HIPAA) stated that pt has not started the medication yet. She stated that it wasn't ava at the first pharmacy but it has since been resent to [facility], [place] has been approved, and the pharmacy is getting medication ready for pickup. She will call when its time to titrate to the next dose.

## 2024-01-04 ENCOUNTER — Other Ambulatory Visit (HOSPITAL_COMMUNITY): Payer: Self-pay

## 2024-01-05 ENCOUNTER — Other Ambulatory Visit: Payer: Self-pay

## 2024-01-05 ENCOUNTER — Other Ambulatory Visit (HOSPITAL_COMMUNITY): Payer: Self-pay

## 2024-01-05 MED ORDER — MOUNJARO 5 MG/0.5ML ~~LOC~~ SOAJ
5.0000 mg | SUBCUTANEOUS | 0 refills | Status: DC
  Filled 2024-01-05: qty 2, 28d supply, fill #0

## 2024-01-17 ENCOUNTER — Other Ambulatory Visit: Payer: Self-pay | Admitting: *Deleted

## 2024-01-17 DIAGNOSIS — C8338 Diffuse large B-cell lymphoma, lymph nodes of multiple sites: Secondary | ICD-10-CM

## 2024-01-17 DIAGNOSIS — C9110 Chronic lymphocytic leukemia of B-cell type not having achieved remission: Secondary | ICD-10-CM

## 2024-01-17 DIAGNOSIS — Z856 Personal history of leukemia: Secondary | ICD-10-CM

## 2024-01-18 ENCOUNTER — Inpatient Hospital Stay: Payer: No Typology Code available for payment source

## 2024-01-18 ENCOUNTER — Inpatient Hospital Stay: Payer: No Typology Code available for payment source | Admitting: Hematology & Oncology

## 2024-01-25 ENCOUNTER — Inpatient Hospital Stay

## 2024-01-25 ENCOUNTER — Inpatient Hospital Stay (HOSPITAL_BASED_OUTPATIENT_CLINIC_OR_DEPARTMENT_OTHER): Admitting: Hematology & Oncology

## 2024-01-25 ENCOUNTER — Encounter: Payer: Self-pay | Admitting: Hematology & Oncology

## 2024-01-25 ENCOUNTER — Inpatient Hospital Stay: Attending: Internal Medicine

## 2024-01-25 VITALS — BP 126/77 | HR 82 | Temp 98.3°F | Resp 20 | Ht 72.0 in

## 2024-01-25 DIAGNOSIS — C9112 Chronic lymphocytic leukemia of B-cell type in relapse: Secondary | ICD-10-CM | POA: Diagnosis not present

## 2024-01-25 DIAGNOSIS — Z9221 Personal history of antineoplastic chemotherapy: Secondary | ICD-10-CM | POA: Diagnosis not present

## 2024-01-25 DIAGNOSIS — Z923 Personal history of irradiation: Secondary | ICD-10-CM | POA: Diagnosis not present

## 2024-01-25 DIAGNOSIS — E119 Type 2 diabetes mellitus without complications: Secondary | ICD-10-CM | POA: Insufficient documentation

## 2024-01-25 DIAGNOSIS — C9110 Chronic lymphocytic leukemia of B-cell type not having achieved remission: Secondary | ICD-10-CM

## 2024-01-25 DIAGNOSIS — C8330 Diffuse large B-cell lymphoma, unspecified site: Secondary | ICD-10-CM | POA: Insufficient documentation

## 2024-01-25 DIAGNOSIS — C8338 Diffuse large B-cell lymphoma, lymph nodes of multiple sites: Secondary | ICD-10-CM

## 2024-01-25 DIAGNOSIS — K5900 Constipation, unspecified: Secondary | ICD-10-CM | POA: Insufficient documentation

## 2024-01-25 DIAGNOSIS — Z7985 Long-term (current) use of injectable non-insulin antidiabetic drugs: Secondary | ICD-10-CM | POA: Diagnosis not present

## 2024-01-25 DIAGNOSIS — Z856 Personal history of leukemia: Secondary | ICD-10-CM

## 2024-01-25 LAB — CMP (CANCER CENTER ONLY)
ALT: 33 U/L (ref 0–44)
AST: 24 U/L (ref 15–41)
Albumin: 4.5 g/dL (ref 3.5–5.0)
Alkaline Phosphatase: 61 U/L (ref 38–126)
Anion gap: 13 (ref 5–15)
BUN: 16 mg/dL (ref 8–23)
CO2: 25 mmol/L (ref 22–32)
Calcium: 9.3 mg/dL (ref 8.9–10.3)
Chloride: 104 mmol/L (ref 98–111)
Creatinine: 1.26 mg/dL — ABNORMAL HIGH (ref 0.61–1.24)
GFR, Estimated: 59 mL/min — ABNORMAL LOW (ref >60)
Glucose, Bld: 115 mg/dL — ABNORMAL HIGH (ref 70–99)
Potassium: 4 mmol/L (ref 3.5–5.1)
Sodium: 141 mmol/L (ref 135–145)
Total Bilirubin: 0.4 mg/dL (ref 0.0–1.2)
Total Protein: 6.8 g/dL (ref 6.5–8.1)

## 2024-01-25 LAB — CBC WITH DIFFERENTIAL (CANCER CENTER ONLY)
Abs Immature Granulocytes: 0.01 K/uL (ref 0.00–0.07)
Basophils Absolute: 0 K/uL (ref 0.0–0.1)
Basophils Relative: 1 %
Eosinophils Absolute: 0.1 K/uL (ref 0.0–0.5)
Eosinophils Relative: 2 %
HCT: 38.1 % — ABNORMAL LOW (ref 39.0–52.0)
Hemoglobin: 13.8 g/dL (ref 13.0–17.0)
Immature Granulocytes: 0 %
Lymphocytes Relative: 36 %
Lymphs Abs: 1.6 K/uL (ref 0.7–4.0)
MCH: 30.7 pg (ref 26.0–34.0)
MCHC: 36.2 g/dL — ABNORMAL HIGH (ref 30.0–36.0)
MCV: 84.9 fL (ref 80.0–100.0)
Monocytes Absolute: 0.5 K/uL (ref 0.1–1.0)
Monocytes Relative: 11 %
Neutro Abs: 2.2 K/uL (ref 1.7–7.7)
Neutrophils Relative %: 50 %
Platelet Count: 196 K/uL (ref 150–400)
RBC: 4.49 MIL/uL (ref 4.22–5.81)
RDW: 13.2 % (ref 11.5–15.5)
WBC Count: 4.4 K/uL (ref 4.0–10.5)
nRBC: 0 % (ref 0.0–0.2)

## 2024-01-25 LAB — LACTATE DEHYDROGENASE: LDH: 263 U/L — ABNORMAL HIGH (ref 98–192)

## 2024-01-25 LAB — SAVE SMEAR(SSMR), FOR PROVIDER SLIDE REVIEW

## 2024-01-25 NOTE — Patient Instructions (Signed)

## 2024-01-25 NOTE — Progress Notes (Signed)
 Hematology and Oncology Follow Up Visit  Ronald Rice [id number] 12/29/1945 78 y.o. 01/25/2024   Principle Diagnosis:  Diffuse large cell non-Hodgkin's lymphoma-Richter's transformation from CLL CLL recurrence   Former Therapy:        R-CHOP-s/p cycle #8-- started on 11/08/2018 XRT to the LEFT neck -- 4500 rad -- completed on 08/01/2019 Acalabrutinib  100 mg po BID -- start on 11/04/2020 -- on hold since 12/2021  Current Therapy: Surveillance   Interim History:  Ronald Rice is here today with his wife for follow-up.  We last saw him back in in February.  Since then, his main problem is being tiredness.  He has not had energy.  He does get around with a rolling walker.  He does have numbness in his hands.  This could be from diabetes also from his past chemotherapy.  It has been probably 5 years since he had chemotherapy.  He may have a little bit of dementia.  He seems to ask his wife for the answers to questions that I ask him.  His last PET scan I think was done last year.  This all looked fine..  I do think he probably needs to have another PET scan.  He certainly is at risk for recurrence of his lymphoma.  Peggy also has history of CLL.  This also could be a problem.  His appetite is pretty good.  He is trying to watch his blood sugars.  He is now on Mounjaro .  He has been on this for about 6 weeks.  He has little bit of constipation.  Overall, I would have to say that his performance status is probably ECOG 2.     Medications:  Allergies as of 01/25/2024   No Known Allergies      Medication List        Accurate as of January 25, 2024  4:09 PM. If you have any questions, ask your nurse or doctor.          STOP taking these medications    CINNAMON PO Stopped by: Kourtni Stineman R Taija Mathias   levETIRAcetam  500 MG tablet Commonly known as: KEPPRA  Stopped by: Maude JONELLE Crease       TAKE these medications    acetaminophen  500 MG tablet Commonly known as: TYLENOL  Take  1,000 mg by mouth every 6 (six) hours as needed (pain).   allopurinol  300 MG tablet Commonly known as: ZYLOPRIM  Take 300 mg by mouth daily.   amLODipine  10 MG tablet Commonly known as: NORVASC  Take 1 tablet (10 mg total) by mouth daily.   aspirin  EC 81 MG tablet Take 81 mg by mouth daily.   atenolol  25 MG tablet Commonly known as: TENORMIN  Take 1 tablet (25 mg total) by mouth daily.   atorvastatin  40 MG tablet Commonly known as: LIPITOR Take 1 tablet (40 mg total) by mouth daily.   chlorthalidone  25 MG tablet Commonly known as: HYGROTON  Take 25 mg by mouth daily. for high blood pressure   diazepam  10 MG tablet Commonly known as: VALIUM  Take 10 mg by mouth.   donepezil  5 MG tablet Commonly known as: ARICEPT  Take 1 tablet by mouth once daily.   ketoconazole 2 % cream Commonly known as: NIZORAL Apply topically 2 (two) times daily as needed.   lidocaine -prilocaine  cream Commonly known as: EMLA  Apply 1 Application topically as needed.   metFORMIN  500 MG 24 hr tablet Commonly known as: GLUCOPHAGE -XR Take 2 tablets (1,000 mg total) by mouth 2 (two) times daily with food  Mounjaro  5 MG/0.5ML Pen Generic drug: tirzepatide  Inject 5 mg into the skin once a week.   pyridoxine 250 MG tablet Commonly known as: B-6 Take 250 mg by mouth daily.   tadalafil  10 MG tablet Commonly known as: CIALIS  Take 1 tablet (10 mg total) by mouth daily as needed.   tamsulosin  0.4 MG Caps capsule Commonly known as: FLOMAX  Take 1 capsule (0.4 mg total) by mouth daily.   VITAMIN D3 PO Take 1 tablet by mouth daily.        Allergies: No Known Allergies  Past Medical History, Surgical history, Social history, and Family History were reviewed and updated.  Review of Systems: Review of Systems  Constitutional: Negative.   HENT: Negative.    Eyes: Negative.   Respiratory: Negative.    Gastrointestinal: Negative.   Genitourinary: Negative.   Musculoskeletal:  Positive for joint  pain and myalgias.  Skin: Negative.   Neurological: Negative.   Endo/Heme/Allergies: Negative.   Psychiatric/Behavioral: Negative.       Physical Exam:  height is 6' (1.829 m). His oral temperature is 98.3 F (36.8 C). His blood pressure is 126/77 and his pulse is 82. His respiration is 20 and oxygen saturation is 98%.   Wt Readings from Last 3 Encounters:  07/20/23 245 lb (111.1 kg)  12/31/22 (!) 301 lb 5.9 oz (136.7 kg)  10/05/22 294 lb (133.4 kg)  Physical Exam Vitals reviewed.  HENT:     Head: Normocephalic and atraumatic.  Eyes:     Pupils: Pupils are equal, round, and reactive to light.  Cardiovascular:     Rate and Rhythm: Normal rate and regular rhythm.     Heart sounds: Normal heart sounds.  Pulmonary:     Effort: Pulmonary effort is normal.     Breath sounds: Normal breath sounds.  Abdominal:     General: Bowel sounds are normal.     Palpations: Abdomen is soft.  Musculoskeletal:        General: No tenderness or deformity. Normal range of motion.     Cervical back: Normal range of motion.  Lymphadenopathy:     Cervical: No cervical adenopathy.  Skin:    General: Skin is warm and dry.     Findings: No erythema or rash.  Neurological:     Mental Status: He is alert and oriented to person, place, and time.  Psychiatric:        Behavior: Behavior normal.        Thought Content: Thought content normal.        Judgment: Judgment normal.      Physical Activity: Not on file   Lab Results  Component Value Date   WBC 4.4 01/25/2024   HGB 13.8 01/25/2024   HCT 38.1 (L) 01/25/2024   MCV 84.9 01/25/2024   PLT 196 01/25/2024   No results found for: FERRITIN, IRON, TIBC, UIBC, IRONPCTSAT Lab Results  Component Value Date   RBC 4.49 01/25/2024   No results found for: KPAFRELGTCHN, LAMBDASER, [facility] Lab Results  Component Value Date   IGGSERUM 466 (L) 02/04/2022   IGA 497 (H) 02/04/2022   IGMSERUM 46 02/04/2022   No results found  for: STEPHANY CARLOTA BENSON MARKEL EARLA JOANNIE DOC VICK, SPEI   Chemistry      Component Value Date/Time   NA 141 01/25/2024 1521   NA 142 01/25/2017 1217   K 4.0 01/25/2024 1521   K 4.1 01/25/2017 1217   CL 104 01/25/2024 1521   CL 102 01/25/2017  1217   CO2 25 01/25/2024 1521   CO2 33 01/25/2017 1217   BUN 16 01/25/2024 1521   BUN 12 01/25/2017 1217   CREATININE 1.26 (H) 01/25/2024 1521   CREATININE 1.4 (H) 01/25/2017 1217      Component Value Date/Time   CALCIUM  9.3 01/25/2024 1521   CALCIUM  9.7 01/25/2017 1217   ALKPHOS 61 01/25/2024 1521   ALKPHOS 59 01/25/2017 1217   AST 24 01/25/2024 1521   ALT 33 01/25/2024 1521   ALT 32 01/25/2017 1217   BILITOT 0.4 01/25/2024 1521      Impression and Plan: Mr. Heckler is a very pleasant 78 yo African American gentleman with history of CLL and then transformed to a large cell non-Hodgkin's lymphoma.   He was treated with radiation for bulky disease in the neck. He completed chemo back in December 2020 followed by radiation in February 2021.   He subsequently had recurrent CLL.  We had him on acalabrutinib .  This has been on hold  due to poor performance status and recurrent infections.   Again, I am not sure why he is so tired.  I do think another PET scan is indicated.  Will get was set up.  I would also check his thyroid .  I still think that diabetes is probably the biggest issue.  I would like to see him back in 6 weeks.   Maude JONELLE Crease, MD 9/3/20254:09 PM

## 2024-01-30 ENCOUNTER — Other Ambulatory Visit: Payer: Self-pay

## 2024-01-30 ENCOUNTER — Other Ambulatory Visit (HOSPITAL_COMMUNITY): Payer: Self-pay

## 2024-01-30 MED ORDER — TIRZEPATIDE 5 MG/0.5ML ~~LOC~~ SOAJ
5.0000 mg | SUBCUTANEOUS | 0 refills | Status: DC
  Filled 2024-01-30: qty 2, 28d supply, fill #0

## 2024-02-09 ENCOUNTER — Encounter (HOSPITAL_COMMUNITY)
Admission: RE | Admit: 2024-02-09 | Discharge: 2024-02-09 | Disposition: A | Discharge status: Home / Self Care | Source: Ambulatory Visit | Attending: Hematology & Oncology | Admitting: Hematology & Oncology

## 2024-02-09 DIAGNOSIS — C8338 Diffuse large B-cell lymphoma, lymph nodes of multiple sites: Secondary | ICD-10-CM | POA: Insufficient documentation

## 2024-02-09 DIAGNOSIS — C8330 Diffuse large B-cell lymphoma, unspecified site: Secondary | ICD-10-CM | POA: Diagnosis not present

## 2024-02-09 LAB — GLUCOSE, CAPILLARY: Glucose-Capillary: 125 mg/dL — ABNORMAL HIGH (ref 70–99)

## 2024-02-09 MED ORDER — FLUDEOXYGLUCOSE F - 18 (FDG) INJECTION
12.2100 | Freq: Once | INTRAVENOUS | Status: AC
  Administered 2024-02-09: 12.21 via INTRAVENOUS

## 2024-02-13 ENCOUNTER — Ambulatory Visit: Payer: Self-pay | Admitting: Hematology & Oncology

## 2024-02-13 NOTE — Telephone Encounter (Signed)
-----   Message from Maude JONELLE Crease sent at 02/13/2024 11:43 AM EDT ----- Please call and let him know that the PET scan does not show any evidence of recurrent lymphoma.  Fantastic news.  Jeralyn ----- Message ----- From: Rebecka, Rad Results In Sent: 02/13/2024   8:58 AM EDT To: Maude JONELLE Crease, MD

## 2024-02-13 NOTE — Telephone Encounter (Signed)
 Call placed to patient's wife, [name] to notify her that pt.'s PET scan does not show any evidence of recurrent lymphoma.  Pt.'s wife is appreciative of call and has no questions or concerns at this time.

## 2024-03-02 ENCOUNTER — Other Ambulatory Visit (HOSPITAL_COMMUNITY): Payer: Self-pay

## 2024-03-05 ENCOUNTER — Other Ambulatory Visit (HOSPITAL_COMMUNITY): Payer: Self-pay

## 2024-03-05 MED ORDER — TIRZEPATIDE 5 MG/0.5ML ~~LOC~~ SOAJ
5.0000 mg | SUBCUTANEOUS | 0 refills | Status: DC
  Filled 2024-03-05 – 2024-03-06 (×2): qty 2, 28d supply, fill #0

## 2024-03-06 ENCOUNTER — Other Ambulatory Visit (HOSPITAL_COMMUNITY): Payer: Self-pay

## 2024-03-08 ENCOUNTER — Inpatient Hospital Stay: Admitting: Hematology & Oncology

## 2024-03-08 ENCOUNTER — Inpatient Hospital Stay: Attending: Internal Medicine

## 2024-03-08 ENCOUNTER — Inpatient Hospital Stay

## 2024-03-20 ENCOUNTER — Inpatient Hospital Stay

## 2024-03-20 ENCOUNTER — Inpatient Hospital Stay: Admitting: Hematology & Oncology

## 2024-03-29 ENCOUNTER — Other Ambulatory Visit: Payer: Self-pay

## 2024-03-29 ENCOUNTER — Encounter: Payer: Self-pay | Admitting: Hematology & Oncology

## 2024-03-29 ENCOUNTER — Inpatient Hospital Stay: Attending: Internal Medicine

## 2024-03-29 ENCOUNTER — Inpatient Hospital Stay (HOSPITAL_BASED_OUTPATIENT_CLINIC_OR_DEPARTMENT_OTHER): Admitting: Hematology & Oncology

## 2024-03-29 ENCOUNTER — Inpatient Hospital Stay

## 2024-03-29 VITALS — BP 138/73 | HR 76 | Temp 98.3°F | Resp 18 | Wt 280.0 lb

## 2024-03-29 DIAGNOSIS — R200 Anesthesia of skin: Secondary | ICD-10-CM | POA: Diagnosis not present

## 2024-03-29 DIAGNOSIS — C8338 Diffuse large B-cell lymphoma, lymph nodes of multiple sites: Secondary | ICD-10-CM

## 2024-03-29 DIAGNOSIS — C8330 Diffuse large B-cell lymphoma, unspecified site: Secondary | ICD-10-CM | POA: Diagnosis present

## 2024-03-29 DIAGNOSIS — C9112 Chronic lymphocytic leukemia of B-cell type in relapse: Secondary | ICD-10-CM | POA: Diagnosis present

## 2024-03-29 LAB — CBC WITH DIFFERENTIAL (CANCER CENTER ONLY)
Abs Immature Granulocytes: 0.02 K/uL (ref 0.00–0.07)
Basophils Absolute: 0 K/uL (ref 0.0–0.1)
Basophils Relative: 1 %
Eosinophils Absolute: 0.1 K/uL (ref 0.0–0.5)
Eosinophils Relative: 1 %
HCT: 38 % — ABNORMAL LOW (ref 39.0–52.0)
Hemoglobin: 13.8 g/dL (ref 13.0–17.0)
Immature Granulocytes: 0 %
Lymphocytes Relative: 33 %
Lymphs Abs: 1.7 K/uL (ref 0.7–4.0)
MCH: 31 pg (ref 26.0–34.0)
MCHC: 36.3 g/dL — ABNORMAL HIGH (ref 30.0–36.0)
MCV: 85.4 fL (ref 80.0–100.0)
Monocytes Absolute: 0.6 K/uL (ref 0.1–1.0)
Monocytes Relative: 12 %
Neutro Abs: 2.8 K/uL (ref 1.7–7.7)
Neutrophils Relative %: 53 %
Platelet Count: 202 K/uL (ref 150–400)
RBC: 4.45 MIL/uL (ref 4.22–5.81)
RDW: 13.2 % (ref 11.5–15.5)
WBC Count: 5.2 K/uL (ref 4.0–10.5)
nRBC: 0 % (ref 0.0–0.2)

## 2024-03-29 LAB — CMP (CANCER CENTER ONLY)
ALT: 36 U/L (ref 0–44)
AST: 26 U/L (ref 15–41)
Albumin: 4.5 g/dL (ref 3.5–5.0)
Alkaline Phosphatase: 57 U/L (ref 38–126)
Anion gap: 13 (ref 5–15)
BUN: 12 mg/dL (ref 8–23)
CO2: 25 mmol/L (ref 22–32)
Calcium: 9.5 mg/dL (ref 8.9–10.3)
Chloride: 103 mmol/L (ref 98–111)
Creatinine: 1.16 mg/dL (ref 0.61–1.24)
GFR, Estimated: >60 mL/min (ref >60)
Glucose, Bld: 133 mg/dL — ABNORMAL HIGH (ref 70–99)
Potassium: 3.7 mmol/L (ref 3.5–5.1)
Sodium: 140 mmol/L (ref 135–145)
Total Bilirubin: 0.6 mg/dL (ref 0.0–1.2)
Total Protein: 6.9 g/dL (ref 6.5–8.1)

## 2024-03-29 LAB — IRON AND IRON BINDING CAPACITY (CC-WL,HP ONLY)
Iron: 116 ug/dL (ref 45–182)
Saturation Ratios: 33 % (ref 17.9–39.5)
TIBC: 351 ug/dL (ref 250–450)
UIBC: 235 ug/dL

## 2024-03-29 LAB — FERRITIN: Ferritin: 39 ng/mL (ref 24–336)

## 2024-03-29 LAB — VITAMIN B12: Vitamin B-12: 498 pg/mL (ref 180–914)

## 2024-03-29 LAB — TSH: TSH: 1.73 u[IU]/mL (ref 0.350–4.500)

## 2024-03-29 MED ORDER — PREGABALIN 150 MG PO CAPS: 150.0000 mg | ORAL_CAPSULE | Freq: Two times a day (BID) | ORAL | 0 refills | Status: AC

## 2024-03-29 NOTE — Progress Notes (Signed)
 Hematology and Oncology Follow Up Visit  Ronald Rice [id number] 1946-04-11 78 y.o. 03/29/2024   Principle Diagnosis:  Diffuse large cell non-Hodgkin's lymphoma-Richter's transformation from CLL CLL recurrence   Former Therapy:        R-CHOP-s/p cycle #8-- started on 11/08/2018 XRT to the LEFT neck -- 4500 rad -- completed on 08/01/2019 Acalabrutinib  100 mg po BID -- start on 11/04/2020 -- on hold since 12/2021  Current Therapy: Surveillance   Interim History:  Ronald Rice is here today with his wife for follow-up.  We last saw him back in September.  Since then, he has been doing pretty well.  He still has a numbness in his hands and feet.  For some reason, he stopped the Lyrica .  I probably get him back on Lyrica .  He had a PET scan that was done on 02/09/2024.  This did not show any evidence of active lymphoma.  He is eating okay.  He is on Mounjaro  right now.  Hopefully, this will help with diabetes.  He has had no bleeding.  He has had no change in bowel or bladder habits.  He has had no fever.  He has had no exposure to COVID.  Overall, I will say that his performance status is probably ECOG 2.    Medications:  Allergies as of 03/29/2024   No Known Allergies      Medication List        Accurate as of March 29, 2024  4:15 PM. If you have any questions, ask your nurse or doctor.          acetaminophen  500 MG tablet Commonly known as: TYLENOL  Take 1,000 mg by mouth every 6 (six) hours as needed (pain).   allopurinol  300 MG tablet Commonly known as: ZYLOPRIM  Take 300 mg by mouth daily.   amLODipine  10 MG tablet Commonly known as: NORVASC  Take 1 tablet (10 mg total) by mouth daily.   aspirin  EC 81 MG tablet Take 81 mg by mouth daily.   atenolol  25 MG tablet Commonly known as: TENORMIN  Take 1 tablet (25 mg total) by mouth daily.   atorvastatin  40 MG tablet Commonly known as: LIPITOR Take 1 tablet (40 mg total) by mouth daily.   chlorthalidone  25 MG  tablet Commonly known as: HYGROTON  Take 25 mg by mouth daily. for high blood pressure   diazepam  10 MG tablet Commonly known as: VALIUM  Take 10 mg by mouth.   donepezil  5 MG tablet Commonly known as: ARICEPT  Take 1 tablet by mouth once daily.   ketoconazole 2 % cream Commonly known as: NIZORAL Apply topically 2 (two) times daily as needed.   lidocaine -prilocaine  cream Commonly known as: EMLA  Apply 1 Application topically as needed.   metFORMIN  500 MG 24 hr tablet Commonly known as: GLUCOPHAGE -XR Take 2 tablets (1,000 mg total) by mouth 2 (two) times daily with food   Mounjaro  5 MG/0.5ML Pen Generic drug: tirzepatide  Inject 5 mg into the skin once a week.   pyridoxine 250 MG tablet Commonly known as: B-6 Take 250 mg by mouth daily.   tadalafil  10 MG tablet Commonly known as: CIALIS  Take 1 tablet (10 mg total) by mouth daily as needed.   tamsulosin  0.4 MG Caps capsule Commonly known as: FLOMAX  Take 1 capsule (0.4 mg total) by mouth daily.   VITAMIN D3 PO Take 1 tablet by mouth daily.        Allergies: No Known Allergies  Past Medical History, Surgical history, Social history, and Family History  were reviewed and updated.  Review of Systems: Review of Systems  Constitutional: Negative.   HENT: Negative.    Eyes: Negative.   Respiratory: Negative.    Gastrointestinal: Negative.   Genitourinary: Negative.   Musculoskeletal:  Positive for joint pain and myalgias.  Skin: Negative.   Neurological: Negative.   Endo/Heme/Allergies: Negative.   Psychiatric/Behavioral: Negative.       Physical Exam:  weight is 280 lb (127 kg). His oral temperature is 98.3 F (36.8 C). His blood pressure is 138/73 and his pulse is 76. His respiration is 18 and oxygen saturation is 100%.   Wt Readings from Last 3 Encounters:  03/29/24 280 lb (127 kg)  07/20/23 245 lb (111.1 kg)  12/31/22 (!) 301 lb 5.9 oz (136.7 kg)  Physical Exam Vitals reviewed.  HENT:     Head:  Normocephalic and atraumatic.  Eyes:     Pupils: Pupils are equal, round, and reactive to light.  Cardiovascular:     Rate and Rhythm: Normal rate and regular rhythm.     Heart sounds: Normal heart sounds.  Pulmonary:     Effort: Pulmonary effort is normal.     Breath sounds: Normal breath sounds.  Abdominal:     General: Bowel sounds are normal.     Palpations: Abdomen is soft.  Musculoskeletal:        General: No tenderness or deformity. Normal range of motion.     Cervical back: Normal range of motion.  Lymphadenopathy:     Cervical: No cervical adenopathy.  Skin:    General: Skin is warm and dry.     Findings: No erythema or rash.  Neurological:     Mental Status: He is alert and oriented to person, place, and time.  Psychiatric:        Behavior: Behavior normal.        Thought Content: Thought content normal.        Judgment: Judgment normal.      Physical Activity: Not on file   Lab Results  Component Value Date   WBC 5.2 03/29/2024   HGB 13.8 03/29/2024   HCT 38.0 (L) 03/29/2024   MCV 85.4 03/29/2024   PLT 202 03/29/2024   No results found for: FERRITIN, IRON, TIBC, UIBC, IRONPCTSAT Lab Results  Component Value Date   RBC 4.45 03/29/2024   No results found for: JONATHAN BONG [facility] Lab Results  Component Value Date   IGGSERUM 466 (L) 02/04/2022   IGA 497 (H) 02/04/2022   IGMSERUM 46 02/04/2022   No results found for: STEPHANY CARLOTA BENSON MARKEL EARLA JOANNIE DOC VICK, SPEI   Chemistry      Component Value Date/Time   NA 140 03/29/2024 1530   NA 142 01/25/2017 1217   K 3.7 03/29/2024 1530   K 4.1 01/25/2017 1217   CL 103 03/29/2024 1530   CL 102 01/25/2017 1217   CO2 25 03/29/2024 1530   CO2 33 01/25/2017 1217   BUN 12 03/29/2024 1530   BUN 12 01/25/2017 1217   CREATININE 1.16 03/29/2024 1530   CREATININE 1.4 (H) 01/25/2017 1217      Component Value Date/Time   CALCIUM  9.5  03/29/2024 1530   CALCIUM  9.7 01/25/2017 1217   ALKPHOS 57 03/29/2024 1530   ALKPHOS 59 01/25/2017 1217   AST 26 03/29/2024 1530   ALT 36 03/29/2024 1530   ALT 32 01/25/2017 1217   BILITOT 0.6 03/29/2024 1530      Impression and Plan: Ronald Rice is  a very pleasant 78 yo African American gentleman with history of CLL and then transformed to a large cell non-Hodgkin's lymphoma.   He was treated with radiation for bulky disease in the neck. He completed chemo back in December 2020 followed by radiation in February 2021.   He subsequently had recurrent CLL.  We had him on acalabrutinib .  This has been on hold  due to poor performance status and recurrent infections.   We will plan to get him back now in 3 to 4 months.  I want to try to get him through most of Winter because a note is difficult for him to get here.   Maude JONELLE Crease, MD 11/6/20254:15 PM

## 2024-03-29 NOTE — Patient Instructions (Signed)

## 2024-04-04 ENCOUNTER — Other Ambulatory Visit (HOSPITAL_COMMUNITY): Payer: Self-pay

## 2024-04-04 ENCOUNTER — Other Ambulatory Visit: Payer: Self-pay

## 2024-04-04 MED ORDER — TIRZEPATIDE 5 MG/0.5ML ~~LOC~~ SOAJ
5.0000 mg | SUBCUTANEOUS | 0 refills | Status: DC
  Filled 2024-04-04: qty 2, 28d supply, fill #0

## 2024-04-27 ENCOUNTER — Telehealth: Payer: Self-pay

## 2024-04-27 ENCOUNTER — Other Ambulatory Visit (HOSPITAL_COMMUNITY): Payer: Self-pay

## 2024-04-27 NOTE — Telephone Encounter (Signed)
 Oral Oncology Patient Advocate Encounter   Received notification that prior authorization for lidocaine -prilocaine  (EMLA ) cream  is required.   PA submitted on 04/27/2024 Key [id number] Status is pending      Charlott Hamilton,  CPhT-Adv  she/her/hers [facility]  [facility] Pharmacy Technician Patient Advocate Specialist III [facility] Phone: [phone]  Fax: [phone] [email or web address]

## 2024-04-29 ENCOUNTER — Other Ambulatory Visit (HOSPITAL_COMMUNITY): Payer: Self-pay

## 2024-04-30 ENCOUNTER — Other Ambulatory Visit (HOSPITAL_COMMUNITY): Payer: Self-pay

## 2024-04-30 MED ORDER — TIRZEPATIDE 5 MG/0.5ML ~~LOC~~ SOAJ
5.0000 mg | SUBCUTANEOUS | 0 refills | Status: DC
  Filled 2024-04-30 – 2024-05-03 (×2): qty 2, 28d supply, fill #0

## 2024-04-30 NOTE — Telephone Encounter (Signed)
 Oral Oncology Patient Advocate Encounter  Prior Authorization for  lidocaine -prilocaine  (EMLA ) cream  has been approved.    PA# [id number] Effective dates: 04/27/2024 through 07/26/2024  Patient's Pharmacy has been notified  Charlott Hamilton,  CPhT-Adv   she/her/hers [facility]  [facility] Pharmacy Technician Patient Advocate Specialist III [facility] Phone: [phone]  Fax: [phone] [email or web address]

## 2024-05-03 ENCOUNTER — Other Ambulatory Visit: Payer: Self-pay

## 2024-05-03 ENCOUNTER — Other Ambulatory Visit (HOSPITAL_COMMUNITY): Payer: Self-pay

## 2024-05-31 ENCOUNTER — Encounter: Payer: Self-pay | Admitting: *Deleted

## 2024-05-31 NOTE — Progress Notes (Signed)
 Dink Creps                                          MRN: [id number]   05/31/2024   The [facility]BCI Quality Team Specialist reviewed this patient medical record for the purposes of chart review for care gap closure. The following were reviewed: chart review for care gap closure-kidney health evaluation for diabetes:eGFR  and uACR.    VBCI Quality Team

## 2024-06-29 ENCOUNTER — Other Ambulatory Visit: Payer: Self-pay

## 2024-06-29 ENCOUNTER — Other Ambulatory Visit (HOSPITAL_COMMUNITY): Payer: Self-pay

## 2024-06-29 MED ORDER — TIRZEPATIDE 5 MG/0.5ML ~~LOC~~ SOAJ
5.0000 mg | SUBCUTANEOUS | 0 refills | Status: AC
  Filled 2024-06-29: qty 2, 28d supply, fill #0

## 2024-08-16 ENCOUNTER — Inpatient Hospital Stay: Admitting: Hematology & Oncology

## 2024-08-16 ENCOUNTER — Inpatient Hospital Stay: Attending: Internal Medicine
